# Patient Record
Sex: Female | Born: 1935
Health system: Southern US, Community
[De-identification: ages and names within clinical notes are randomized; demographics above are authoritative.]

## PROBLEM LIST (undated history)

## (undated) DIAGNOSIS — I10 Essential (primary) hypertension: Secondary | ICD-10-CM

## (undated) DIAGNOSIS — J811 Chronic pulmonary edema: Secondary | ICD-10-CM

## (undated) DIAGNOSIS — I5030 Unspecified diastolic (congestive) heart failure: Secondary | ICD-10-CM

## (undated) DIAGNOSIS — I4891 Unspecified atrial fibrillation: Secondary | ICD-10-CM

## (undated) DIAGNOSIS — Z9289 Personal history of other medical treatment: Secondary | ICD-10-CM

## (undated) DIAGNOSIS — Z8781 Personal history of (healed) traumatic fracture: Secondary | ICD-10-CM

## (undated) DIAGNOSIS — I7774 Dissection of vertebral artery: Secondary | ICD-10-CM

## (undated) DIAGNOSIS — Z9889 Other specified postprocedural states: Secondary | ICD-10-CM

## (undated) DIAGNOSIS — M549 Dorsalgia, unspecified: Secondary | ICD-10-CM

## (undated) DIAGNOSIS — G4700 Insomnia, unspecified: Secondary | ICD-10-CM

## (undated) DIAGNOSIS — J189 Pneumonia, unspecified organism: Secondary | ICD-10-CM

## (undated) DIAGNOSIS — M1990 Unspecified osteoarthritis, unspecified site: Secondary | ICD-10-CM

## (undated) DIAGNOSIS — R112 Nausea with vomiting, unspecified: Secondary | ICD-10-CM

## (undated) DIAGNOSIS — G43909 Migraine, unspecified, not intractable, without status migrainosus: Secondary | ICD-10-CM

## (undated) DIAGNOSIS — I509 Heart failure, unspecified: Secondary | ICD-10-CM

## (undated) DIAGNOSIS — R0600 Dyspnea, unspecified: Secondary | ICD-10-CM

## (undated) DIAGNOSIS — K5900 Constipation, unspecified: Secondary | ICD-10-CM

## (undated) DIAGNOSIS — Z8489 Family history of other specified conditions: Secondary | ICD-10-CM

## (undated) DIAGNOSIS — E871 Hypo-osmolality and hyponatremia: Secondary | ICD-10-CM

## (undated) DIAGNOSIS — T8859XA Other complications of anesthesia, initial encounter: Secondary | ICD-10-CM

## (undated) DIAGNOSIS — K5909 Other constipation: Secondary | ICD-10-CM

## (undated) DIAGNOSIS — F419 Anxiety disorder, unspecified: Secondary | ICD-10-CM

## (undated) DIAGNOSIS — G500 Trigeminal neuralgia: Secondary | ICD-10-CM

## (undated) DIAGNOSIS — F329 Major depressive disorder, single episode, unspecified: Secondary | ICD-10-CM

## (undated) DIAGNOSIS — I499 Cardiac arrhythmia, unspecified: Secondary | ICD-10-CM

## (undated) DIAGNOSIS — G8929 Other chronic pain: Secondary | ICD-10-CM

## (undated) DIAGNOSIS — F32A Depression, unspecified: Secondary | ICD-10-CM

## (undated) HISTORY — DX: Unspecified diastolic (congestive) heart failure: I50.30

## (undated) HISTORY — PX: PARTIAL HYSTERECTOMY: SHX80

## (undated) HISTORY — DX: Other chronic pain: G89.29

## (undated) HISTORY — DX: Dorsalgia, unspecified: M54.9

## (undated) HISTORY — DX: Personal history of (healed) traumatic fracture: Z87.81

## (undated) HISTORY — DX: Dissection of vertebral artery: I77.74

## (undated) HISTORY — DX: Unspecified atrial fibrillation: I48.91

## (undated) HISTORY — DX: Essential (primary) hypertension: I10

## (undated) HISTORY — DX: Major depressive disorder, single episode, unspecified: F32.9

## (undated) HISTORY — DX: Hypo-osmolality and hyponatremia: E87.1

## (undated) HISTORY — PX: ANKLE FRACTURE SURGERY: SHX122

## (undated) HISTORY — DX: Anxiety disorder, unspecified: F41.9

## (undated) HISTORY — DX: Migraine, unspecified, not intractable, without status migrainosus: G43.909

## (undated) HISTORY — DX: Insomnia, unspecified: G47.00

## (undated) HISTORY — DX: Depression, unspecified: F32.A

## (undated) HISTORY — PX: CARDIAC CATHETERIZATION: SHX172

## (undated) HISTORY — PX: COLONOSCOPY: SHX174

## (undated) HISTORY — DX: Other constipation: K59.09

---

## 2000-08-31 ENCOUNTER — Encounter: Payer: Self-pay | Admitting: Family Medicine

## 2000-08-31 ENCOUNTER — Ambulatory Visit (HOSPITAL_COMMUNITY): Admission: RE | Admit: 2000-08-31 | Discharge: 2000-08-31 | Payer: Self-pay | Admitting: Family Medicine

## 2001-06-09 ENCOUNTER — Encounter: Payer: Self-pay | Admitting: Family Medicine

## 2001-06-09 ENCOUNTER — Ambulatory Visit (HOSPITAL_COMMUNITY): Admission: RE | Admit: 2001-06-09 | Discharge: 2001-06-09 | Payer: Self-pay | Admitting: Family Medicine

## 2001-08-06 ENCOUNTER — Ambulatory Visit (HOSPITAL_COMMUNITY): Admission: RE | Admit: 2001-08-06 | Discharge: 2001-08-06 | Payer: Self-pay | Admitting: Internal Medicine

## 2002-12-01 ENCOUNTER — Encounter: Payer: Self-pay | Admitting: Family Medicine

## 2002-12-01 ENCOUNTER — Ambulatory Visit (HOSPITAL_COMMUNITY): Admission: RE | Admit: 2002-12-01 | Discharge: 2002-12-01 | Payer: Self-pay | Admitting: Family Medicine

## 2003-04-13 ENCOUNTER — Ambulatory Visit (HOSPITAL_COMMUNITY): Admission: RE | Admit: 2003-04-13 | Discharge: 2003-04-13 | Payer: Self-pay | Admitting: Family Medicine

## 2003-09-30 ENCOUNTER — Ambulatory Visit (HOSPITAL_COMMUNITY): Admission: RE | Admit: 2003-09-30 | Discharge: 2003-09-30 | Payer: Self-pay | Admitting: Family Medicine

## 2003-10-10 ENCOUNTER — Ambulatory Visit (HOSPITAL_COMMUNITY): Admission: RE | Admit: 2003-10-10 | Discharge: 2003-10-10 | Payer: Self-pay | Admitting: Family Medicine

## 2004-05-07 ENCOUNTER — Ambulatory Visit (HOSPITAL_COMMUNITY): Admission: RE | Admit: 2004-05-07 | Discharge: 2004-05-07 | Payer: Self-pay | Admitting: Family Medicine

## 2005-03-10 ENCOUNTER — Ambulatory Visit (HOSPITAL_COMMUNITY): Admission: RE | Admit: 2005-03-10 | Discharge: 2005-03-10 | Payer: Self-pay | Admitting: Family Medicine

## 2005-06-12 ENCOUNTER — Ambulatory Visit (HOSPITAL_COMMUNITY): Admission: RE | Admit: 2005-06-12 | Discharge: 2005-06-12 | Payer: Self-pay | Admitting: Family Medicine

## 2006-07-02 ENCOUNTER — Ambulatory Visit (HOSPITAL_COMMUNITY): Admission: RE | Admit: 2006-07-02 | Discharge: 2006-07-02 | Payer: Self-pay | Admitting: Family Medicine

## 2007-07-15 ENCOUNTER — Ambulatory Visit (HOSPITAL_COMMUNITY): Admission: RE | Admit: 2007-07-15 | Discharge: 2007-07-15 | Payer: Self-pay | Admitting: Family Medicine

## 2008-01-21 ENCOUNTER — Ambulatory Visit: Payer: Self-pay | Admitting: Cardiovascular Disease

## 2008-01-21 ENCOUNTER — Inpatient Hospital Stay (HOSPITAL_COMMUNITY): Admission: EM | Admit: 2008-01-21 | Discharge: 2008-01-22 | Payer: Self-pay | Admitting: Emergency Medicine

## 2008-11-07 ENCOUNTER — Ambulatory Visit: Payer: Self-pay | Admitting: Internal Medicine

## 2008-11-07 DIAGNOSIS — M810 Age-related osteoporosis without current pathological fracture: Secondary | ICD-10-CM | POA: Insufficient documentation

## 2008-11-07 DIAGNOSIS — I10 Essential (primary) hypertension: Secondary | ICD-10-CM | POA: Insufficient documentation

## 2008-11-07 DIAGNOSIS — K5909 Other constipation: Secondary | ICD-10-CM | POA: Insufficient documentation

## 2008-11-07 DIAGNOSIS — K921 Melena: Secondary | ICD-10-CM | POA: Insufficient documentation

## 2008-11-08 ENCOUNTER — Encounter: Payer: Self-pay | Admitting: Gastroenterology

## 2008-11-14 ENCOUNTER — Ambulatory Visit: Payer: Self-pay | Admitting: Gastroenterology

## 2008-11-14 ENCOUNTER — Ambulatory Visit (HOSPITAL_COMMUNITY): Admission: RE | Admit: 2008-11-14 | Discharge: 2008-11-14 | Payer: Self-pay | Admitting: Gastroenterology

## 2009-02-15 ENCOUNTER — Inpatient Hospital Stay (HOSPITAL_COMMUNITY): Admission: EM | Admit: 2009-02-15 | Discharge: 2009-02-19 | Payer: Self-pay | Admitting: Emergency Medicine

## 2009-02-15 ENCOUNTER — Ambulatory Visit: Payer: Self-pay | Admitting: Orthopedic Surgery

## 2009-02-16 ENCOUNTER — Encounter: Payer: Self-pay | Admitting: Orthopedic Surgery

## 2009-02-20 ENCOUNTER — Encounter: Payer: Self-pay | Admitting: Orthopedic Surgery

## 2009-03-01 ENCOUNTER — Ambulatory Visit: Payer: Self-pay | Admitting: Orthopedic Surgery

## 2009-03-01 DIAGNOSIS — S82843A Displaced bimalleolar fracture of unspecified lower leg, initial encounter for closed fracture: Secondary | ICD-10-CM | POA: Insufficient documentation

## 2009-03-12 ENCOUNTER — Telehealth: Payer: Self-pay | Admitting: Orthopedic Surgery

## 2009-03-29 ENCOUNTER — Ambulatory Visit: Payer: Self-pay | Admitting: Orthopedic Surgery

## 2009-04-03 ENCOUNTER — Encounter (HOSPITAL_COMMUNITY): Admission: RE | Admit: 2009-04-03 | Discharge: 2009-05-03 | Payer: Self-pay | Admitting: Orthopedic Surgery

## 2009-04-06 ENCOUNTER — Encounter: Payer: Self-pay | Admitting: Orthopedic Surgery

## 2009-04-11 ENCOUNTER — Encounter: Payer: Self-pay | Admitting: Orthopedic Surgery

## 2009-04-27 ENCOUNTER — Encounter: Payer: Self-pay | Admitting: Orthopedic Surgery

## 2009-05-04 ENCOUNTER — Encounter (HOSPITAL_COMMUNITY): Admission: RE | Admit: 2009-05-04 | Discharge: 2009-06-03 | Payer: Self-pay | Admitting: Orthopedic Surgery

## 2009-05-14 ENCOUNTER — Ambulatory Visit: Payer: Self-pay | Admitting: Orthopedic Surgery

## 2009-05-14 DIAGNOSIS — G5760 Lesion of plantar nerve, unspecified lower limb: Secondary | ICD-10-CM | POA: Insufficient documentation

## 2009-05-25 ENCOUNTER — Encounter: Payer: Self-pay | Admitting: Orthopedic Surgery

## 2009-06-13 ENCOUNTER — Ambulatory Visit: Payer: Self-pay | Admitting: Orthopedic Surgery

## 2010-03-21 NOTE — Assessment & Plan Note (Signed)
Summary: HOSP FOL UP/RT ANKLE POST OP 12/31/10XR CAST CHANGE REMOVE SU...   Referring Provider:  Dr. Lubertha South Primary Provider:  Dr. Lubertha South   History of Present Illness: I saw Katherine Lane in the office today for a followup visit.  She is a 77 years old woman with the complaint of:  POST OP RIGHT ANKLE.  DOS   02-16-09.  Procedure Open treatment and internal fixation of right ankle.  Medication   not taking any medication right now.  Patient is extremely claustrophobic.    Subjectives  Xray, cast change, and remove sutures.  Examination reveals that she has some anterior blisters which are small.  Her wounds look fine.  Has minimal swelling in the foot.  X-rays 3 views of the RIGHT ankle show a lateral plate medial screw with a pin.  The fractures were well reduced the mortise is intact  Impression stable fixation RIGHT ankle  Doing well I put her in a Aircast because of claustrophobia we did some wound care with a Xeroform and bandage.  Continue nonweightbearing   Allergies: 1)  ! Codeine   Impression & Recommendations:  Problem # 1:  AFTERCARE FOLLOW SURGERY MUSCULOSKEL SYSTEM NEC (ICD-V58.78) Assessment Comment Only  Orders: Post-Op Check (16109) Short Leg Cast (60454)  Problem # 2:  CLOSED BIMALLEOLAR FRACTURE (ICD-824.4) Assessment: Comment Only  Orders: Post-Op Check (09811) Short Leg Cast (91478)  Patient Instructions: 1)  no weight bearing  2)  return in 4 weeks for xrays right ankle

## 2010-03-21 NOTE — Miscellaneous (Signed)
Summary: Addendum to Plan of treatment  Addendum to Plan of treatment   Imported By: Jacklynn Ganong 03/14/2009 13:14:15  _____________________________________________________________________  External Attachment:    Type:   Image     Comment:   External Document

## 2010-03-21 NOTE — Letter (Signed)
Summary: TCS ORDER  TCS ORDER   Imported By: Elinor Parkinson 11/08/2008 12:33:00  _____________________________________________________________________  External Attachment:    Type:   Image     Comment:   External Document

## 2010-03-21 NOTE — Miscellaneous (Signed)
Summary: PT Progress note  PT Progress note   Imported By: Jacklynn Ganong 05/15/2009 13:09:53  _____________________________________________________________________  External Attachment:    Type:   Image     Comment:   External Document

## 2010-03-21 NOTE — Assessment & Plan Note (Signed)
Summary: 1 M RE-CK RT ANKLE/HAD SURG 02/16/09/MEDICARE,BC/CAF   Visit Type:  Follow-up Referring Provider:  Dr. Lubertha South Primary Provider:  Dr. Lubertha South  CC:  one month recheck rt ankle post op.  History of Present Illness: She is a 77 year old woman on February 17, 1999 and the patient had open treatment internal fixation of the RIGHT ankle with bimalleolar fracture after fracture dislocation.  Her main problems at this point include swelling and increased redness over the ankle joint after the end of the day.  This always goes down by the morning.  Otherwise she is weightbearing as tolerated, she is in regular shoes with no brace.  She can do almost everything she did before her injury.  She does get a twinge of pain here and there.  She is on no medications for the ankle.  When I saw her last time she got an injection and that seems to have done well  DOS 02/16/09 right ankle.    Allergies: 1)  ! Codeine  Physical Exam  Additional Exam:  the ankle looks very good at this point.  There is no swelling at this time and the incisions have healed nicely.  She is 10 of dorsiflexion 25 of plantar flexion ankle is stable.  Pulse and perfusion are normal in the sensation on the dorsum of the foot is normal   Impression & Recommendations:  Problem # 1:  LESION OF PLANTAR NERVE (ICD-355.6) Assessment Improved  Orders: Est. Patient Level II (40981)  Problem # 2:  CLOSED BIMALLEOLAR FRACTURE (ICD-824.4) Assessment: Improved  Orders: Est. Patient Level II (19147)  Patient Instructions: 1)  Please schedule a follow-up appointment as needed.

## 2010-03-21 NOTE — Assessment & Plan Note (Signed)
Summary: 4 WK RE-CK/XRAY RT ANKLE/POST OP 02/16/09/MEDICARE/CAF   Visit Type:  Follow-up Referring Provider:  Dr. Lubertha South Primary Provider:  Dr. Lubertha South  CC:  POST OP ANKLE.  History of Present Illness: She is a 77 year old woman  POD # 41  DOS 02/16/09 right ankle.  Procedure  Open treatment and internal fixation of right ankle.  Medication Asprin as needed.  Subjectives recheck ankle with xrays.  Patient is nonweightbearing in aircast.  C/O  throbbing at night.  The patient's ankle is quite stiff, but I can bring her foot to neutral. She has about 20 of plantar flexion. Her wounds look good. Her skin is improving.  Her x-rays show 3 views of the RIGHT ankle. There is a long plate with internal fixation of the medial screw and pin with an intact mortise. The fracture is healing with no visible fracture line.  Impression healing fracture   return in 6 weeks for repeat xrays          Allergies: 1)  ! Codeine   Impression & Recommendations:  Problem # 1:  AFTERCARE FOLLOW SURGERY MUSCULOSKEL SYSTEM NEC (ICD-V58.78) Assessment Improved  Orders: Physical Therapy Referral (PT) Post-Op Check (16109) Ankle x-ray complete,  minimum 3 views (60454)  Problem # 2:  CLOSED BIMALLEOLAR FRACTURE (ICD-824.4) Assessment: Improved  Orders: Post-Op Check (09811) Ankle x-ray complete,  minimum 3 views (91478)  Patient Instructions: 1)  Physical Therapy  2)  Air cast  3)  FWB  4)  retrun in 6 weeks xrays right ankle

## 2010-03-21 NOTE — Assessment & Plan Note (Signed)
Summary: TROUBLE WITH HER BOWELS,CONSULT FOR COLONOSCOPY/AMS   Visit Type:  Initial Consult Referring Provider:  Dr. Lubertha South Primary Care Provider:  Dr. Lubertha South  Chief Complaint:  severe constipation and hemorroids.  History of Present Illness: 77 y/o caucasian female with constipation/obstipation for over 20 years, worse in past year.  Has to use digital dis-impaction.  Using miralax 17 grams daily worked well, then stopped working.  c/o large bleeding feels like hemorrhoids.  Mag citrate seems to help.  c/o rectal pain constant.  c/o round hard balls.  Tried fiber but quit working.  Using OTC glycerin suppositories couple times per month.  Using fleets enemas once q2-3 months.  Prior ineffective treatment includes laxatives, high fiber, stool softeners, and suppositories.  The patient complains of nausea, rectal pain, rectal bleeding, incomplete evacuation, previous history of constipation, and # 2-3 BM /week, but denies diarrhea, abdominal pain, vomiting, flatus, and constipation in childhood.     Current Problems (verified): 1)  Hematochezia  (ICD-578.1) 2)  Hypertension  (ICD-401.9) 3)  Osteoporosis  (ICD-733.00) 4)  Constipation, Chronic  (ICD-564.09)  Current Medications (verified): 1)  Calan Sr 240 Mg Cr-Tabs (Verapamil Hcl) .... Once Daily 2)  Vasotec 20 Mg Tabs (Enalapril Maleate) .... Take 1/2 Once Daily 3)  Toprol Xl 25 Mg Xr24h-Tab (Metoprolol Succinate) .... Once Daily 4)  Ambien 10 Mg Tabs (Zolpidem Tartrate) .... At Bedtime 5)  Calcium With Vit D .... Once Daily 6)  Citrate of Magnesia  Soln (Magnesium Citrate) .... As Needed 7)  Amitiza 24 Mcg Caps (Lubiprostone) .... One By Mouth Daily For 2 Days As Needed For Constipation, May Increase To Twice Per Day As Needed Constipation  Allergies (verified): 1)  ! Codeine  Past History:  Past Medical History: HYPERTENSION (ICD-401.9) OSTEOPOROSIS (ICD-733.00) CONSTIPATION, CHRONIC (ICD-564.09)  Colonoscopy by Dr Karilyn Cota 08/06/01-> medium sigmoid diverticula, hemorrhoids, diffuse melanosis   Past Surgical History: partial hysterectomy (77 yrs old)  Family History: No known family history of colorectal carcinoma, IBD, liver or chronic GI problems. Father: (deceased 60) hip fx Mother: (deceased 68) Alzheimer's, CA uterine Siblings: 4 alive DM 1 daughter DM  Social History: married 1955 3 healthy grown children Occupation: Futures trader, retired' Patient has never smoked.  Alcohol Use - no Daily Caffeine Use Illicit Drug Use - no Patient gets regular exercise. Smoking Status:  never Drug Use:  no Does Patient Exercise:  yes  Review of Systems General:  Complains of sleep disorder; denies fever, chills, sweats, anorexia, fatigue, weakness, malaise, and weight loss. CV:  Denies chest pains, angina, palpitations, syncope, dyspnea on exertion, orthopnea, PND, peripheral edema, and claudication. Resp:  Denies dyspnea at rest, dyspnea with exercise, cough, sputum, wheezing, coughing up blood, and pleurisy. GI:  Complains of indigestion/heartburn; denies difficulty swallowing, pain on swallowing, vomiting, vomiting blood, jaundice, gas/bloating, and fecal incontinence; occ monthly indigestion, takes TUMS helps. GU:  Denies urinary burning, blood in urine, nocturnal urination, urinary frequency, urinary incontinence, and abnormal vaginal bleeding. Derm:  Denies rash, itching, dry skin, hives, moles, warts, and unhealing ulcers. Psych:  Denies depression, anxiety, memory loss, suicidal ideation, hallucinations, paranoia, phobia, and confusion. Heme:  Complains of bleeding; denies bruising and enlarged lymph nodes; hemorrhoids.  Vital Signs:  Patient profile:   78 year old female Height:      62.5 inches Weight:      142 pounds BMI:     25.65 Temp:     97.8 degrees F oral Pulse rate:   72 / minute  BP sitting:   124 / 80  (left arm) Cuff size:   regular   Vitals Entered By: Hendricks Limes LPN (November 07, 2008 10:29 AM)  Physical Exam  General:  Well developed, well nourished, no acute distress. Head:  Normocephalic and atraumatic. Eyes:  Sclera clear, no icterus. Ears:  Normal auditory acuity. Nose:  No deformity, discharge,  or lesions. Mouth:  No deformity or lesions, dentition normal. Neck:  Supple; no masses or thyromegaly. Lungs:  Clear throughout to auscultation. Heart:  Regular rate and rhythm; no murmurs, rubs,  or bruits. Abdomen:  Soft, nontender and nondistended. No masses, hepatosplenomegaly or hernias noted. Normal bowel sounds.without guarding and without rebound.   Rectal:  small amt soft med brown stool in vault.  hemocult negative and external hemorrhoids and skin tags.  No internal masses palpated.  Good sphincter tone. Msk:  Symmetrical with no gross deformities. Normal posture. Pulses:  Normal pulses noted. Extremities:  No clubbing, cyanosis, edema or deformities noted. Neurologic:  Alert and  oriented x4;  grossly normal neurologically. Skin:  Intact without significant lesions or rashes. Cervical Nodes:  No significant cervical adenopathy. Inguinal Nodes:  No significant inguinal adenopathy. Psych:  Alert and cooperative. Normal mood and affect.  Impression & Recommendations:  Problem # 1:  HEMATOCHEZIA (ICD-578.1) 77 y/o caucasian female with chronic constipation/obstipation, worsening over past year.  She has had intermittant hematochezia which could be due to benign ano-rectal source such as hemorrhoids, diverticula, but we do need to rule out colorectal carcinoma given the fact that last colonoscopy was over 7 yrs ago.   Diagnostic colonoscopy to be performed by Dr. Kassie Mends in the near future.  I have discussed risks and benefits which include, but are not limited to, bleeding, infection, perforation, or medication reaction.  The patient agrees with this plan and consent will be obtained.  Orders:  Diagnostic Colonoscopy (Colonscpy - Diag) Consultation Level III [phone])  Problem # 2:  CONSTIPATION, CHRONIC (ICD-564.09) Will start amitiza.  It is possible that she may have chronic pelvic floor dysfunction.  Patient Instructions: 1)  Constipation and Hemorrhoids brochure given.  2)  Begin Amitiza daily or twice per day as needed for constipation,  Hold if you develop diarrhea 3)  Fiberchoice or Benefiber every day 4)  Can use plain stool softeners or miralax as needed as well 5)  Goal:  BM every other day 6)  The medication list was reviewed and reconciled.  All changed / newly prescribed medications were explained.  A complete medication list was provided to the patient / caregiver. Prescriptions: AMITIZA 24 MCG CAPS (LUBIPROSTONE) one by mouth daily for 2 days as needed for constipation, may increase to twice per day as needed constipation  #60 x 1   Entered and Authorized by:   Joselyn Arrow FNP-BC   Signed by:   Joselyn Arrow FNP-BC on 11/07/2008   Method used:   Electronically to        [facility] (retail)       [address]       [place], [place]  [address]       Ph: [phone]       Fax: [phone]   RxID:   [id number]   Appended Document: TROUBLE WITH HER BOWELS,CONSULT FOR COLONOSCOPY/AMS Agree with above. Needs high fiber diet HO.  Appended Document: TROUBLE WITH HER BOWELS,CONSULT FOR COLONOSCOPY/AMS Called, line busy. Diet placed in the mail.  Appended Document: TROUBLE WITH HER BOWELS,CONSULT FOR COLONOSCOPY/AMS Pt aware diet in the mail.  Appended Document: TROUBLE WITH HER BOWELS,CONSULT FOR COLONOSCOPY/AMS WEIGHT: JAN 2010 138.6

## 2010-03-21 NOTE — Progress Notes (Signed)
Summary: wanats order for wheelchair  Phone Note Other Incoming   Summary of Call: Katherine Lane with [facility] called and requested an order be faxed to [facility] for a wheelchair with elevated footrest  for Katherine Lane ([birth year]) [facility] # [phone]   Initial call taken by: Katherine Lane,  March 12, 2009 12:10 PM  Follow-up for Phone Call        ok to call in request  Follow-up by: Katherine Canada MD,  March 12, 2009 12:44 PM  Additional Follow-up for Phone Call Additional follow up Details #1::        Gave this request to [facility] at [facility] they will need a written order faxed tomorrow 03/13/09 Additional Follow-up by: Katherine Lane,  March 12, 2009 5:15 PM    New/Updated Medications: WHEELCHAIR  MISC (MISC. DEVICES) one wheel chair with right elevated foot rest  rt ankle fracture post op Prescriptions: WHEELCHAIR  MISC (MISC. DEVICES) one wheel chair with right elevated foot rest  rt ankle fracture post op  #1 x 0   Entered by:   Katherine Lane   Authorized by:   Katherine Canada MD   Signed by:   Katherine Lane on 03/13/2009   Method used:   Faxed to ...       [facility] (retail)       [address]       [place], [place]  [address]       Ph: [phone]       Fax: [phone]   RxID:   [id number]

## 2010-03-21 NOTE — Miscellaneous (Signed)
Summary: physician's orders for equipment  physician's orders for equipment   Imported By: Jacklynn Ganong 04/11/2009 09:53:30  _____________________________________________________________________  External Attachment:    Type:   Image     Comment:   External Document

## 2010-03-21 NOTE — Miscellaneous (Signed)
Summary: PT progress note  PT progress note   Imported By: Jacklynn Ganong 06/04/2009 12:22:57  _____________________________________________________________________  External Attachment:    Type:   Image     Comment:   External Document

## 2010-03-21 NOTE — Miscellaneous (Signed)
Summary: Rehab Report  Rehab Report   Imported By: Elvera Maria 04/10/2009 11:12:24  _____________________________________________________________________  External Attachment:    Type:   Image     Comment:   clinical eval pt

## 2010-03-21 NOTE — Assessment & Plan Note (Signed)
Summary: 6 WK RE-CK/XRAYS RT ANKLE/POST OP 02/16/09/MEDICARE,BCBS/CAF   Visit Type:  Follow-up Referring Provider:  Dr. Lubertha South Primary Provider:  Dr. Lubertha South  CC:  post op ankle.  History of Present Illness: She is a 77 year old woman  DOS 02/16/09 right ankle.  3 months status post:   Procedure  Open treatment and internal fixation of right ankle.  Medication Asprin as needed.   Patient is weightbearing in aircast.   Subjectives recheck ankle with xrays after PT. she also complains of some pain plantar aspect of the foot between the fourth and third digit with some associated tingling  Positive squeeze test for neuroma  Ankle range of motion is indicated in therapy report from March 18 which I have reviewed that  He is to be doing well has little swelling a little  dependent rubor in the foot  X-rays 3 views RIGHT ankle Lateral plate and screw construct with medial pin and screw mortise is intact fracture is healed  Impression healed RIGHT ankle fracture with internal fixation        Allergies: 1)  ! Codeine  Review of Systems Neurologic:  Complains of numbness; third and fourth toe pain with numbness.   Impression & Recommendations:  Problem # 1:  AFTERCARE FOLLOW SURGERY MUSCULOSKEL SYSTEM NEC (ICD-V58.78) Assessment Improved  Orders: Post-Op Check (16109) Ankle x-ray complete,  minimum 3 views (60454)  Problem # 2:  CLOSED BIMALLEOLAR FRACTURE (ICD-824.4) Assessment: Improved  Orders: Post-Op Check (09811) Ankle x-ray complete,  minimum 3 views (91478)  Problem # 3:  LESION OF PLANTAR NERVE (ICD-355.6) Assessment: New  injected third and fourth interspace After sterile prep and the third fourth interspace of the RIGHT foot was injected for neuroma  No complications  She will continue her physical therapy and return in a month no x-ray  Orders: Est. Patient Level III (29562) Joint Aspirate / Injection, Small (13086) Depo-  Medrol 40mg  (J1030)  Patient Instructions: 1)  You have received an injection of cortisone today. You may experience increased pain at the injection site. Apply ice pack to the area for 20 minutes every 2 hours and take 2 xtra strength tylenol every 8 hours. This increased pain will usually resolve in 24 hours. The injection will take effect in 3-10 days.  2)  Continue Physical Therapy  3)  Return in 1 month

## 2010-04-01 ENCOUNTER — Emergency Department (HOSPITAL_COMMUNITY)
Admission: EM | Admit: 2010-04-01 | Discharge: 2010-04-01 | Disposition: A | Discharge status: Home / Self Care | Payer: Medicare Other | Attending: Emergency Medicine | Admitting: Emergency Medicine

## 2010-04-01 DIAGNOSIS — R112 Nausea with vomiting, unspecified: Secondary | ICD-10-CM | POA: Insufficient documentation

## 2010-04-01 DIAGNOSIS — I10 Essential (primary) hypertension: Secondary | ICD-10-CM | POA: Insufficient documentation

## 2010-04-01 DIAGNOSIS — R197 Diarrhea, unspecified: Secondary | ICD-10-CM | POA: Insufficient documentation

## 2010-04-01 DIAGNOSIS — Z79899 Other long term (current) drug therapy: Secondary | ICD-10-CM | POA: Insufficient documentation

## 2010-04-01 LAB — DIFFERENTIAL
Basophils Absolute: 0 10*3/uL (ref 0.0–0.1)
Basophils Relative: 0 % (ref 0–1)
Eosinophils Absolute: 0 10*3/uL (ref 0.0–0.7)
Eosinophils Relative: 0 % (ref 0–5)
Lymphocytes Relative: 9 % — ABNORMAL LOW (ref 12–46)
Lymphs Abs: 0.7 10*3/uL (ref 0.7–4.0)
Monocytes Absolute: 0.4 10*3/uL (ref 0.1–1.0)
Monocytes Relative: 5 % (ref 3–12)
Neutro Abs: 6.3 10*3/uL (ref 1.7–7.7)
Neutrophils Relative %: 85 % — ABNORMAL HIGH (ref 43–77)

## 2010-04-01 LAB — BASIC METABOLIC PANEL
BUN: 18 mg/dL (ref 6–23)
CO2: 24 mEq/L (ref 19–32)
Calcium: 9 mg/dL (ref 8.4–10.5)
Chloride: 106 mEq/L (ref 96–112)
Creatinine, Ser: 1.05 mg/dL (ref 0.4–1.2)
GFR calc Af Amer: >60 mL/min (ref >60)
GFR calc non Af Amer: 51 mL/min — ABNORMAL LOW (ref >60)
Glucose, Bld: 107 mg/dL — ABNORMAL HIGH (ref 70–99)
Potassium: 3.7 mEq/L (ref 3.5–5.1)
Sodium: 142 mEq/L (ref 135–145)

## 2010-04-01 LAB — CBC
HCT: 42 % (ref 36.0–46.0)
Hemoglobin: 14.1 g/dL (ref 12.0–15.0)
MCH: 29.9 pg (ref 26.0–34.0)
MCHC: 33.6 g/dL (ref 30.0–36.0)
MCV: 89.2 fL (ref 78.0–100.0)
Platelets: 261 10*3/uL (ref 150–400)
RBC: 4.71 MIL/uL (ref 3.87–5.11)
RDW: 13.8 % (ref 11.5–15.5)
WBC: 7.4 10*3/uL (ref 4.0–10.5)

## 2010-04-01 LAB — HEPATIC FUNCTION PANEL
ALT: 17 U/L (ref 0–35)
AST: 26 U/L (ref 0–37)
Albumin: 3.9 g/dL (ref 3.5–5.2)
Alkaline Phosphatase: 74 U/L (ref 39–117)
Bilirubin, Direct: 0.2 mg/dL (ref 0.0–0.3)
Indirect Bilirubin: 0.5 mg/dL (ref 0.3–0.9)
Total Bilirubin: 0.7 mg/dL (ref 0.3–1.2)
Total Protein: 6.7 g/dL (ref 6.0–8.3)

## 2010-04-01 LAB — LIPASE, BLOOD: Lipase: 17 U/L (ref 11–59)

## 2010-05-01 ENCOUNTER — Other Ambulatory Visit (HOSPITAL_BASED_OUTPATIENT_CLINIC_OR_DEPARTMENT_OTHER): Payer: Self-pay | Admitting: Family Medicine

## 2010-05-01 DIAGNOSIS — Z139 Encounter for screening, unspecified: Secondary | ICD-10-CM

## 2010-05-09 ENCOUNTER — Ambulatory Visit (HOSPITAL_COMMUNITY)
Admission: RE | Admit: 2010-05-09 | Discharge: 2010-05-09 | Disposition: A | Discharge status: Home / Self Care | Payer: Medicare Other | Source: Ambulatory Visit | Attending: Family Medicine | Admitting: Family Medicine

## 2010-05-09 DIAGNOSIS — Z139 Encounter for screening, unspecified: Secondary | ICD-10-CM

## 2010-05-09 DIAGNOSIS — Z1231 Encounter for screening mammogram for malignant neoplasm of breast: Secondary | ICD-10-CM | POA: Insufficient documentation

## 2010-05-20 LAB — HEPATIC FUNCTION PANEL
ALT: 15 U/L (ref 0–35)
AST: 21 U/L (ref 0–37)
Albumin: 3.9 g/dL (ref 3.5–5.2)
Alkaline Phosphatase: 71 U/L (ref 39–117)
Bilirubin, Direct: 0.1 mg/dL (ref 0.0–0.3)
Indirect Bilirubin: 0.3 mg/dL (ref 0.3–0.9)
Total Bilirubin: 0.4 mg/dL (ref 0.3–1.2)
Total Protein: 6.3 g/dL (ref 6.0–8.3)

## 2010-05-20 LAB — URINALYSIS, ROUTINE W REFLEX MICROSCOPIC
Bilirubin Urine: NEGATIVE
Glucose, UA: NEGATIVE mg/dL
Hgb urine dipstick: NEGATIVE
Ketones, ur: NEGATIVE mg/dL
Nitrite: NEGATIVE
Protein, ur: NEGATIVE mg/dL
Specific Gravity, Urine: <1.005 — ABNORMAL LOW (ref 1.005–1.030)
Urobilinogen, UA: 0.2 mg/dL (ref 0.0–1.0)
pH: 6.5 (ref 5.0–8.0)

## 2010-05-20 LAB — BASIC METABOLIC PANEL
BUN: 12 mg/dL (ref 6–23)
CO2: 29 mEq/L (ref 19–32)
Calcium: 9.1 mg/dL (ref 8.4–10.5)
Chloride: 106 mEq/L (ref 96–112)
Creatinine, Ser: 0.91 mg/dL (ref 0.4–1.2)
GFR calc Af Amer: >60 mL/min (ref >60)
GFR calc non Af Amer: >60 mL/min (ref >60)
Glucose, Bld: 108 mg/dL — ABNORMAL HIGH (ref 70–99)
Potassium: 3.6 mEq/L (ref 3.5–5.1)
Sodium: 141 mEq/L (ref 135–145)

## 2010-05-20 LAB — DIFFERENTIAL
Basophils Absolute: 0 10*3/uL (ref 0.0–0.1)
Basophils Relative: 0 % (ref 0–1)
Eosinophils Absolute: 0 10*3/uL (ref 0.0–0.7)
Eosinophils Relative: 0 % (ref 0–5)
Lymphocytes Relative: 15 % (ref 12–46)
Lymphs Abs: 1.1 10*3/uL (ref 0.7–4.0)
Monocytes Absolute: 0.3 10*3/uL (ref 0.1–1.0)
Monocytes Relative: 4 % (ref 3–12)
Neutro Abs: 5.7 10*3/uL (ref 1.7–7.7)
Neutrophils Relative %: 80 % — ABNORMAL HIGH (ref 43–77)

## 2010-05-20 LAB — CBC
HCT: 38.9 % (ref 36.0–46.0)
Hemoglobin: 13.2 g/dL (ref 12.0–15.0)
MCHC: 33.9 g/dL (ref 30.0–36.0)
MCV: 90.1 fL (ref 78.0–100.0)
Platelets: 231 10*3/uL (ref 150–400)
RBC: 4.32 MIL/uL (ref 3.87–5.11)
RDW: 13.8 % (ref 11.5–15.5)
WBC: 7.1 10*3/uL (ref 4.0–10.5)

## 2010-07-02 ENCOUNTER — Encounter: Payer: Self-pay | Admitting: Orthopedic Surgery

## 2010-07-02 ENCOUNTER — Ambulatory Visit (INDEPENDENT_AMBULATORY_CARE_PROVIDER_SITE_OTHER): Payer: Medicare Other | Admitting: Orthopedic Surgery

## 2010-07-02 DIAGNOSIS — S82899A Other fracture of unspecified lower leg, initial encounter for closed fracture: Secondary | ICD-10-CM

## 2010-07-02 NOTE — Discharge Summary (Signed)
NAMEKARINNA, BEADLES NO.:  [id number]   MEDICAL RECORD NO.:  [id number]          PATIENT TYPE:  INP   LOCATION:  4739                         FACILITY:  [facility]   PHYSICIAN:  Pricilla Riffle, MD, FACCDATE OF BIRTH:  07/07/1932   DATE OF ADMISSION:  01/21/2008  DATE OF DISCHARGE:  01/22/2008                               DISCHARGE SUMMARY   PRIMARY CARE PHYSICIAN:  Scott A. Gerda Diss, MD   DISCHARGING DIAGNOSIS:  Chest pain, negative cardiac workup this  admission status post cardiac catheterization showed normal coronary  angiography and left ventricular wall motion, normal pulmonary artery  pressure and pulmonary artery wedge pressure.  Also status post CT  angiogram of the chest showed no evidence of acute pulmonary emboli or  other acute chest process.  The patient to follow up with Dr. Gerda Diss,  her primary care physician for further evaluation.   PAST MEDICAL HISTORY:  1. Hypertension.  2. Psoriasis, on steroids.  3. Osteoporosis.  4. Chronic constipation.  5. Chronic back pain.  6. Migraine headaches.   HOSPITAL COURSE:  Ms. Fidalgo is a [name] with no  known prior history of coronary artery disease who complained of  exertional dyspnea and fatigue over 1 month.  Over the last few weeks,  noted aching, throbbing pain, substernal with exertion, relieved with  rest.  Prior to admission, the patient continued to have the same  symptoms, was admitted for further evaluation after being seen by Dr.  Earney Hamburg who felt the patient would benefit from cardiac  catheterization for definitive evaluation of coronary anatomy and  possible treatment.  The patient was willing to proceed.  She underwent  cardiac catheterization on January 21, 2008, by Dr. Charlies Constable.  The  patient with normal coronary anatomy, underwent CT angiogram, and she  had a positive D-dimer.  CT angiogram results as stated above.  The  patient seen by Dr. Dietrich Pates on  day of discharge.  The patient mildly  hypertensive still.  Beta-blocker therapy initiated in addition to  verapamil 240 mg daily, Vasotec 10 mg daily, aspirin 81 mg daily.  Toprol-XL 25 mg daily also initiated.  The patient instructed to take  aspirin 81 mg daily.  Okay  to resume her previous calcium and vitamin D.  Ambien and lorazepam as  previously prescribed.  The patient to follow up with Dr. Gerda Diss for  further evaluation and post-cath check in 1-2 weeks.   DURATION OF DISCHARGE ENCOUNTER:  Less than 30 minutes.      Dorian Pod, ACNP      Pricilla Riffle, MD, [facility]  Electronically Signed    MB/MEDQ  D:  01/22/2008  T:  01/22/2008  Job:  [id number]   cc:   Lorin Picket A. Gerda Diss, MD

## 2010-07-02 NOTE — Discharge Summary (Signed)
NAME:  Katherine Lane, Katherine Lane NO.:  [id number]   MEDICAL RECORD NO.:  [id number]          PATIENT TYPE:  EMS   LOCATION:  MAJO                         FACILITY:  [facility]   PHYSICIAN:  Verne Carrow, MDDATE OF BIRTH:  Jun 25, 1932   DATE OF ADMISSION:  01/21/2008  DATE OF DISCHARGE:                               DISCHARGE SUMMARY   PRIMARY CARDIOLOGIST:  Verne Carrow, MD   PRIMARY CARE PHYSICIAN:  Donna Bernard, MD   HISTORY OF PRESENT ILLNESS:  This is a [name]  without prior history of CAD with complaints of exertional dyspnea and  fatigue over the last month.  Over the last few weeks, she had a achy  throbbing pain substernal with exertion, relieved with rest.  Yesterday  afternoon while decorating for Christmas, she had substernal chest pain  lasting 2 hours, relieved with rest, but when she began to exert herself  again, the pain returned.  She took an aspirin and an Ambien last night  and went to sleep.  She did call her primary care physician after the  pain had been occurring to make an appointment.  The pain was associated  with shortness of breath, and her daughter stated that when she was  speaking with her on the phone she was short of breath, but has noticed  that she has become more short of breath over the last month.  The  patient was seen by Dr. Gerda Diss in the office who did an EKG and sent her  for evaluation in the emergency room.  On arrival, the patient has been  pain-free and has not had any chest discomfort today.  She does admit to  weakness associated with discomfort yesterday, and the pain was  nonradiating.   REVIEW OF SYSTEMS:  Positive for chest pain, shortness of breath,  dyspnea on exertion, and generalized weakness and fatigue, which has  been progressive.   PAST MEDICAL HISTORY:  1. Hypertension.  The patient states that she has had recently hard-to-      control hypertension, although she has had  it for 15 years.  She      has been noticing that her blood pressures have been continuing to      rise greater than 200 systolic.  2. Psoriasis, on steroids.  3. Osteoporosis.  4. Chronic constipation.  5. Chronic back pain.  6. Migraine headaches.   PAST SURGICAL HISTORY:  Hysterectomy.   SOCIAL HISTORY:  She lives in [place] with her husband, and she is  retired.  She has never smoked.  She does not drink alcohol.  She walks  daily and was very active at home until recently.   FAMILY HISTORY:  Mother deceased with Alzheimer's.  Father deceased  after chronic medical issues who fell, was hospitalized, and never  recovered.  She has siblings with no cardiac issues or prior history of  cardiac disease.   CURRENT MEDICATIONS:  1. Calan SR 240 mg daily.  2. Vasotec 10 mg at bedtime.  3. Calcium daily.  4. Vitamin D daily.  5. Ambien 10 mg at  bedtime.  6. Lorazepam 0.5 mg p.r.n.  7. Hydrochlorothiazide p.r.n.   ALLERGIES:  To CODEINE causing severe nausea and vomiting.   CURRENT LABORATORY DATA:  Pending.   PHYSICAL EXAMINATION:  VITAL SIGNS:  Blood pressure 159/84, pulse 77,  respirations 18, temperature 97.4, and O2 sat 94% on room air.  GENERAL:  She is awake, alert, and oriented.  No acute distress at  present.  HEENT:  Head is normocephalic and atraumatic.  Eyes:  PERRLA.  Mucous  membranes of mouth pink and moist.  Tongue is midline.  NECK:  Supple.  There is no JVD.  No carotid bruits appreciated.  CARDIOVASCULAR:  Regular rate and rhythm without murmurs, rubs, or  gallops.  Pulses 2+ and equal without bruits noted.  LUNGS:  Clear to auscultation without wheezes, rales, or rhonchi.  ABDOMEN:  Soft, nontender with 2+ bowel sounds.  EXTREMITIES:  Without clubbing, cyanosis, or edema.  NEUROLOGIC:  Cranial nerves II through XII are grossly intact.   Chest x-ray is pending.  EKG completed at [facility] revealed sinus  rhythm with sinus arrhythmia with evidence of  lateral infarct.   IMPRESSION:  1. Chest pain, symptoms worrisome for angina.  2. Hypertension.  3. History of chronic back pain.   This is a [name] without prior history of coronary  artery disease, but has history of hypertension, more difficult to  control over the last month with complaints of throbbing achy chest pain  with exertion associated with weakness and shortness of breath along  with dyspnea on exertion.  The pain was relieved with rest.  We will  admit, rule out myocardial infarction,  cycle cardiac enzymes, and start  on low-dose beta-blocker and aspirin.   Dr. Clifton James has also assessed the patient and feels that it would be  best to plan cardiac catheterization today for definitive evaluation of  coronary anatomy.  The patient has been explained risks, benefits, and  also a description of  the procedure.  She is willing to proceed.  The patient has been  scheduled for cardiac catheterization within the hour.  We will make  further recommendations based upon hospital course, results of  catheterization, and the patient's response to treatment.      Katherine Lane. Katherine Bishop, NP      Verne Carrow, MD  Electronically Signed    KML/MEDQ  D:  01/21/2008  T:  01/22/2008  Job:  [id number]   cc:   Donna Bernard, M.D.

## 2010-07-02 NOTE — Progress Notes (Signed)
A 77 year old female, status post open treatment internal fixation of bimalleolar ankle fracture in 2010 , Has done very well, presents back with 2 complaints one loss of the hardware is becoming dislodged on the lateral side of the ankle as the screws are prominent into there is a cyst or swelling. That formed in the anterolateral ankle joint, after she's ambulated during the day, seems to go down during the night.  Review of systems otherwise, normal.  Exam shows a well-developed, well-nourished, female, swimming arranging intact. Normal small frame.  No tenderness or pain around the hardware. There is a small cyst at the anterolateral ankle joint ankle joint is otherwise stable. Screws are palpable beneath the skin, no problems with the skin. No cellulitis is noted. No redness is noted. Ankle joint is normal. In terms of swelling.  X-ray will be taken.  X-rays show no problems with the hardware. The fracture, healed, and the ankle mortise is perfectly reduced.  Patient is advised to come back on an as-needed basis. If the cyst persists even after the evening and next day, then I would advise to have it removed or we could even aspirated.

## 2010-07-02 NOTE — Progress Notes (Signed)
Separate x-ray report.  RIGHT ankle.  Reason for x-ray. Patient felt screws subcutaneously.  Previous bimalleolar ankle fracture open treatment internal fixation.  No problems are seen on the x-ray. The hardware is intact. The fracture healed. The mortise is reduced.  Impression normal ankle x-ray with internal fixation

## 2010-07-02 NOTE — Cardiovascular Report (Signed)
NAMEELIF, YONTS                ACCOUNT NO.:  [id number]   MEDICAL RECORD NO.:  [id number]          PATIENT TYPE:  INP   LOCATION:  4739                         FACILITY:  [facility]   PHYSICIAN:  Everardo Beals. Juanda Chance, MD, FACCDATE OF BIRTH:  1932-07-11   DATE OF PROCEDURE:  01/21/2008  DATE OF DISCHARGE:                            CARDIAC CATHETERIZATION   CLINICAL HISTORY:  Ms. Plourde is 77 years old and has no prior history  of known heart disease.  She does have a history of hypertension.  She  has had symptoms of dyspnea on exertion over the past 2 months and over  the past 2 weeks she has had chest pressure with exertion.  She was seen  by Dr. Lilyan Punt and was sent to our emergency room for admission and  further evaluation.  She was seen by Dr. Clifton James and arrangements were  made for her to be evaluated with angiography.   PROCEDURE:  A left heart catheterization was performed percutaneously  through the left femoral artery using arterial sheath and a 5-French  preformed coronary catheter.  A front wall arterial puncture was  performed and Omnipaque contrast was used.  After we identified that  there was no major coronary obstruction, we decided to do a right heart  catheterization.  This was performed via the right femoral vein using  the Anesthesia placed Swan-Ganz thermodilution catheter.  The patient  tolerated the procedure well and left the laboratory in satisfactory  condition.   RESULTS:  The left main coronary artery:  The left main coronary artery  was free of significant disease.   The left anterior descending artery:  The left anterior descending  artery gave rise to a diagonal branch, 2 septal perforators, and a  second diagonal branch.  The LAD had mild irregularities, but no major  obstruction.   The circumflex artery:  The circumflex artery gave rise to 2 small  marginal branches and atrial branch and then a large posterolateral  branch.  These vessels were  free of significant disease.   The right coronary artery:  The right coronary artery is a moderate-  sized vessel, had an anterior takeoff.  This vessel gave rise to a conus  branch, a right ventricular branch, a posterior descending branch, and a  small posterolateral branch.  This vessel is free of significant  disease.   The left ventriculogram:  The left ventriculogram performed on the RAO  projection showed vigorous wall motion with no areas of hypokinesis.  The estimated ejection fraction was 70%.   The right atrial pressure was 2 mean.  Pulmonary artery pressure was  22/8 with a mean of 13.  The pulmonary wedge pressure was 4 mean.  Left  ventricular pressure was 154/13.  The aortic pressure was 157/88 with a  mean of 120.  Cardiac output/cardiac index was 4.3/2.6  liters/minutes/meter squared.   CONCLUSION:  1. Normal coronary angiography and left ventricular wall motion.  2. Normal pulmonary artery pressure and pulmonary artery wedge      pressure.   RECOMMENDATIONS:  The patient has no source of  ischemia to explain her  recent symptoms.  She does have a positive D-dimer, and with these  recent symptoms, I think pulmonary embolism needs to be excluded and we  will arrange a CT angiogram tomorrow to evaluate this possibility.      Bruce Elvera Lennox Juanda Chance, MD, [facility]  Electronically Signed     BRB/MEDQ  D:  01/21/2008  T:  01/22/2008  Job:  [id number]   cc:   Lorin Picket A. Gerda Diss, MD  Verne Carrow, MD  Cardiopulmonary Laboratory  W. Simone Curia, M.D.

## 2010-11-22 LAB — DIFFERENTIAL
Basophils Absolute: 0 10*3/uL (ref 0.0–0.1)
Basophils Relative: 0 % (ref 0–1)
Eosinophils Absolute: 0 10*3/uL (ref 0.0–0.7)
Eosinophils Relative: 0 % (ref 0–5)
Lymphocytes Relative: 32 % (ref 12–46)
Lymphs Abs: 1.7 10*3/uL (ref 0.7–4.0)
Monocytes Absolute: 0.2 10*3/uL (ref 0.1–1.0)
Monocytes Relative: 4 % (ref 3–12)
Neutro Abs: 3.3 10*3/uL (ref 1.7–7.7)
Neutrophils Relative %: 64 % (ref 43–77)

## 2010-11-22 LAB — BASIC METABOLIC PANEL
BUN: 10 mg/dL (ref 6–23)
BUN: 13 mg/dL (ref 6–23)
CO2: 26 mEq/L (ref 19–32)
CO2: 26 mEq/L (ref 19–32)
Calcium: 8.4 mg/dL (ref 8.4–10.5)
Calcium: 9.8 mg/dL (ref 8.4–10.5)
Chloride: 106 mEq/L (ref 96–112)
Chloride: 109 mEq/L (ref 96–112)
Creatinine, Ser: 0.9 mg/dL (ref 0.4–1.2)
Creatinine, Ser: 0.98 mg/dL (ref 0.4–1.2)
GFR calc Af Amer: >60 mL/min (ref >60)
GFR calc Af Amer: >60 mL/min (ref >60)
GFR calc non Af Amer: 56 mL/min — ABNORMAL LOW (ref >60)
GFR calc non Af Amer: >60 mL/min (ref >60)
Glucose, Bld: 100 mg/dL — ABNORMAL HIGH (ref 70–99)
Glucose, Bld: 96 mg/dL (ref 70–99)
Potassium: 3.5 mEq/L (ref 3.5–5.1)
Potassium: 4.3 mEq/L (ref 3.5–5.1)
Sodium: 140 mEq/L (ref 135–145)
Sodium: 141 mEq/L (ref 135–145)

## 2010-11-22 LAB — CARDIAC PANEL(CRET KIN+CKTOT+MB+TROPI)
CK, MB: 1.7 ng/mL (ref 0.3–4.0)
CK, MB: 1.9 ng/mL (ref 0.3–4.0)
CK, MB: 2.3 ng/mL (ref 0.3–4.0)
Relative Index: INVALID (ref 0.0–2.5)
Relative Index: INVALID (ref 0.0–2.5)
Relative Index: INVALID (ref 0.0–2.5)
Total CK: 59 U/L (ref 7–177)
Total CK: 61 U/L (ref 7–177)
Total CK: 65 U/L (ref 7–177)
Troponin I: 0.02 ng/mL (ref 0.00–0.06)
Troponin I: 0.02 ng/mL (ref 0.00–0.06)
Troponin I: 0.04 ng/mL (ref 0.00–0.06)

## 2010-11-22 LAB — CBC
HCT: 35.2 % — ABNORMAL LOW (ref 36.0–46.0)
HCT: 43.1 % (ref 36.0–46.0)
Hemoglobin: 11.9 g/dL — ABNORMAL LOW (ref 12.0–15.0)
Hemoglobin: 14.9 g/dL (ref 12.0–15.0)
MCHC: 33.8 g/dL (ref 30.0–36.0)
MCHC: 34.5 g/dL (ref 30.0–36.0)
MCV: 88.8 fL (ref 78.0–100.0)
MCV: 90.5 fL (ref 78.0–100.0)
Platelets: 244 10*3/uL (ref 150–400)
Platelets: 284 10*3/uL (ref 150–400)
RBC: 3.89 MIL/uL (ref 3.87–5.11)
RBC: 4.85 MIL/uL (ref 3.87–5.11)
RDW: 13.6 % (ref 11.5–15.5)
RDW: 13.6 % (ref 11.5–15.5)
WBC: 5.2 10*3/uL (ref 4.0–10.5)
WBC: 5.7 10*3/uL (ref 4.0–10.5)

## 2010-11-22 LAB — TSH: TSH: 2.645 u[IU]/mL (ref 0.350–4.500)

## 2010-11-22 LAB — URINE MICROSCOPIC-ADD ON

## 2010-11-22 LAB — POCT I-STAT 3, VENOUS BLOOD GAS (G3P V)
Bicarbonate: 25.8 mEq/L — ABNORMAL HIGH (ref 20.0–24.0)
O2 Saturation: 73 %
TCO2: 27 mmol/L (ref 0–100)
pCO2, Ven: 43.6 mmHg — ABNORMAL LOW (ref 45.0–50.0)
pH, Ven: 7.381 — ABNORMAL HIGH (ref 7.250–7.300)
pO2, Ven: 40 mmHg (ref 30.0–45.0)

## 2010-11-22 LAB — URINALYSIS, ROUTINE W REFLEX MICROSCOPIC
Bilirubin Urine: NEGATIVE
Glucose, UA: NEGATIVE mg/dL
Hgb urine dipstick: NEGATIVE
Ketones, ur: NEGATIVE mg/dL
Nitrite: NEGATIVE
Protein, ur: NEGATIVE mg/dL
Specific Gravity, Urine: 1.007 (ref 1.005–1.030)
Urobilinogen, UA: 0.2 mg/dL (ref 0.0–1.0)
pH: 6 (ref 5.0–8.0)

## 2010-11-22 LAB — LIPID PANEL
Cholesterol: 161 mg/dL (ref 0–200)
HDL: 40 mg/dL (ref >39)
LDL Cholesterol: 103 mg/dL — ABNORMAL HIGH (ref 0–99)
Total CHOL/HDL Ratio: 4 RATIO
Triglycerides: 89 mg/dL (ref <150)
VLDL: 18 mg/dL (ref 0–40)

## 2010-11-22 LAB — MAGNESIUM: Magnesium: 2.3 mg/dL (ref 1.5–2.5)

## 2010-11-22 LAB — CK TOTAL AND CKMB (NOT AT ARMC)
CK, MB: 2.5 ng/mL (ref 0.3–4.0)
Relative Index: 2.4 (ref 0.0–2.5)
Total CK: 106 U/L (ref 7–177)

## 2010-11-22 LAB — PROTIME-INR
INR: 1 (ref 0.00–1.49)
Prothrombin Time: 13 seconds (ref 11.6–15.2)

## 2010-11-22 LAB — TROPONIN I: Troponin I: <0.01 ng/mL (ref 0.00–0.06)

## 2010-11-22 LAB — D-DIMER, QUANTITATIVE: D-Dimer, Quant: 0.81 ug/mL-FEU — ABNORMAL HIGH (ref 0.00–0.48)

## 2010-11-22 LAB — APTT: aPTT: 26 seconds (ref 24–37)

## 2011-04-30 ENCOUNTER — Other Ambulatory Visit: Payer: Self-pay | Admitting: Family Medicine

## 2011-04-30 DIAGNOSIS — Z139 Encounter for screening, unspecified: Secondary | ICD-10-CM

## 2011-05-15 ENCOUNTER — Ambulatory Visit (HOSPITAL_COMMUNITY)
Admission: RE | Admit: 2011-05-15 | Discharge: 2011-05-15 | Disposition: A | Discharge status: Home / Self Care | Payer: Medicare Other | Source: Ambulatory Visit | Attending: Family Medicine | Admitting: Family Medicine

## 2011-05-15 DIAGNOSIS — Z139 Encounter for screening, unspecified: Secondary | ICD-10-CM

## 2011-05-15 DIAGNOSIS — Z1231 Encounter for screening mammogram for malignant neoplasm of breast: Secondary | ICD-10-CM | POA: Insufficient documentation

## 2011-06-12 ENCOUNTER — Ambulatory Visit (INDEPENDENT_AMBULATORY_CARE_PROVIDER_SITE_OTHER): Payer: Medicare Other | Admitting: Orthopedic Surgery

## 2011-06-12 ENCOUNTER — Encounter: Payer: Self-pay | Admitting: Orthopedic Surgery

## 2011-06-12 VITALS — BP 146/80 | Ht 62.5 in | Wt 139.0 lb

## 2011-06-12 DIAGNOSIS — G5730 Lesion of lateral popliteal nerve, unspecified lower limb: Secondary | ICD-10-CM | POA: Insufficient documentation

## 2011-06-12 NOTE — Patient Instructions (Signed)
Call us to schedule surgery / no appointment needed   Right leg superficial peroneal nerve release

## 2011-06-12 NOTE — Progress Notes (Signed)
Patient ID: Katherine Lane, female   DOB: 1932/06/19, 78 y.o.   MRN: [id number] Chief Complaint  Patient presents with  . Follow-up    recheck cyst on right ankle    Recheck cyst over the RIGHT ankle joint, status post internal fixation for RIGHT ankle fracture.  The patient plays a burning pain in the foot radiating proximally, especially after activity.  She has a positive Tinel's at the proximal portion of the incision and tenderness along the superficial peroneal nerve.  We have decided to do a superficial peroneal nerve release. She will call us to schedule the surgery. No appointment necessary. Risks and benefits have been explained.

## 2011-06-30 ENCOUNTER — Telehealth: Payer: Self-pay | Admitting: Orthopedic Surgery

## 2011-06-30 NOTE — Telephone Encounter (Signed)
Katherine Lane, please call [name]. Lindahl, she is ready to schedule her surgery.  Her last note said no appointment needed. Her # [phone]

## 2011-07-01 ENCOUNTER — Other Ambulatory Visit: Payer: Self-pay | Admitting: *Deleted

## 2011-07-01 NOTE — Telephone Encounter (Signed)
Surgery scheduled and patient aware 

## 2011-07-02 ENCOUNTER — Encounter (HOSPITAL_COMMUNITY): Payer: Self-pay

## 2011-07-09 NOTE — Patient Instructions (Addendum)
[address] Katherine [address]  07/09/2011   Your procedure is scheduled on:  07/18/11  Report to [facility] at  [place]  AM.  Call this number if you have problems the morning of surgery: [phone]   Remember:   Do not eat food:After Midnight.  May have clear liquids:until Midnight .  Marland Kitchen  Take these medicines the morning of surgery with A SIP OF WATER Vasotec,hctz,metoprolol,calan   Do not wear jewelry, make-up or nail polish.  Do not wear lotions, powders, or perfumes. You may wear deodorant.  Do not shave 48 hours prior to surgery. Men may shave face and neck.  Do not bring valuables to the hospital.  Contacts, dentures or bridgework may not be worn into surgery.  Leave suitcase in the car. After surgery it may be brought to your room.  For patients admitted to the hospital, checkout time is 11:00 AM the day of discharge.   Patients discharged the day of surgery will not be allowed to drive home.  Name and phone number of your driver: family  Special Instructions: CHG Shower Use Special Wash: 1/2 bottle night before surgery and 1/2 bottle morning of surgery.   Please read over the following fact sheets that you were given: Pain Booklet, MRSA Information, Surgical Site Infection Prevention, Anesthesia Post-op Instructions and Care and Recovery After Surgery PATIENT INSTRUCTIONS POST-ANESTHESIA  IMMEDIATELY FOLLOWING SURGERY:  Do not drive or operate machinery for the first twenty four hours after surgery.  Do not make any important decisions for twenty four hours after surgery or while taking narcotic pain medications or sedatives.  If you develop intractable nausea and vomiting or a severe headache please notify your doctor immediately.  FOLLOW-UP:  Please make an appointment with your surgeon as instructed. You do not need to follow up with anesthesia unless specifically instructed to do so.  WOUND CARE INSTRUCTIONS (if applicable):  Keep a dry clean dressing on the anesthesia/puncture wound site if  there is drainage.  Once the wound has quit draining you may leave it open to air.  Generally you should leave the bandage intact for twenty four hours unless there is drainage.  If the epidural site drains for more than 36-48 hours please call the anesthesia departmen

## 2011-07-10 ENCOUNTER — Encounter (HOSPITAL_COMMUNITY)
Admission: RE | Admit: 2011-07-10 | Discharge: 2011-07-10 | Disposition: A | Discharge status: Home / Self Care | Payer: Medicare Other | Source: Ambulatory Visit | Attending: Orthopedic Surgery | Admitting: Orthopedic Surgery

## 2011-07-10 ENCOUNTER — Encounter (HOSPITAL_COMMUNITY): Payer: Self-pay

## 2011-07-10 ENCOUNTER — Other Ambulatory Visit: Payer: Self-pay

## 2011-07-10 ENCOUNTER — Telehealth: Payer: Self-pay | Admitting: Orthopedic Surgery

## 2011-07-10 HISTORY — DX: Other specified postprocedural states: R11.2

## 2011-07-10 HISTORY — DX: Other specified postprocedural states: Z98.890

## 2011-07-10 HISTORY — DX: Unspecified osteoarthritis, unspecified site: M19.90

## 2011-07-10 LAB — BASIC METABOLIC PANEL
BUN: 17 mg/dL (ref 6–23)
CO2: 27 mEq/L (ref 19–32)
Calcium: 9.8 mg/dL (ref 8.4–10.5)
Chloride: 107 mEq/L (ref 96–112)
Creatinine, Ser: 0.92 mg/dL (ref 0.50–1.10)
GFR calc Af Amer: 68 mL/min — ABNORMAL LOW (ref >90)
GFR calc non Af Amer: 59 mL/min — ABNORMAL LOW (ref >90)
Glucose, Bld: 121 mg/dL — ABNORMAL HIGH (ref 70–99)
Potassium: 4 mEq/L (ref 3.5–5.1)
Sodium: 144 mEq/L (ref 135–145)

## 2011-07-10 LAB — HEMOGLOBIN AND HEMATOCRIT, BLOOD
HCT: 40.2 % (ref 36.0–46.0)
Hemoglobin: 13.3 g/dL (ref 12.0–15.0)

## 2011-07-10 LAB — SURGICAL PCR SCREEN
MRSA, PCR: NEGATIVE
Staphylococcus aureus: NEGATIVE

## 2011-07-10 NOTE — Telephone Encounter (Signed)
RE: Out-patient surgery scheduled 07/18/11 at [facility] - Per Medicare guidelines, No pre-authorization required for surgery.  RE: Secondary insurer, [employer], no pre-authorization required as they follow Medicare guidelines.

## 2011-07-17 NOTE — H&P (Signed)
Katherine Lane is an 78 y.o. female.   Chief Complaint: pain right leg  HPI: s/p OTIF right ankle did well initially. She has noticed pain and paresthesias over the proximal part of the incision and into the dorsum of the foot   Past Medical History  Diagnosis Date  . HTN (hypertension)   . Osteoporosis   . Constipation, chronic   . PONV (postoperative nausea and vomiting)   . Arthritis     Past Surgical History  Procedure Date  . Partial hysterectomy   . Ankle fracture surgery     2010-right    Family History  Problem Relation Age of Onset  . Alzheimer's disease    . Cancer    . Anesthesia problems Neg Hx   . Hypotension Neg Hx   . Malignant hyperthermia Neg Hx   . Pseudochol deficiency Neg Hx    Social History:  reports that she has never smoked. She does not have any smokeless tobacco history on file. She reports that she does not drink alcohol or use illicit drugs.  Allergies:  Allergies  Allergen Reactions  . Codeine Nausea And Vomiting    No prescriptions prior to admission    No results found for this or any previous visit (from the past 48 hour(s)). No results found.  Review of Systems  Musculoskeletal: Positive for joint pain.  Neurological: Positive for tingling.  All other systems reviewed and are negative.    There were no vitals taken for this visit. Physical Exam  Constitutional: She is oriented to person, place, and time. She appears well-developed and well-nourished.  HENT:  Head: Normocephalic and atraumatic.  Neck: Normal range of motion. Neck supple. No tracheal deviation present.  Cardiovascular: Normal rate.   Respiratory: Effort normal.  GI: Soft.  Musculoskeletal:       Right shoulder: Normal.       Left shoulder: Normal.       Right hip: Normal.       Left hip: Normal.       Right knee: Normal.       Left knee: Normal.       Right ankle: She exhibits normal range of motion, no swelling, no ecchymosis, no deformity, no  laceration and normal pulse. tenderness. No lateral malleolus, no medial malleolus, no AITFL, no CF ligament, no posterior TFL and no proximal fibula tenderness found. Achilles tendon exhibits no pain, no defect and normal Thompson's test results.       Left ankle: Normal.       Tinel's positive over the superficial peroneal nerve   Lymphadenopathy:    She has no cervical adenopathy.  Neurological: She is alert and oriented to person, place, and time. She has normal reflexes.  Skin: Skin is warm and dry.  Psychiatric: She has a normal mood and affect. Her behavior is normal. Judgment and thought content normal.     Assessment/Plan superf peron nerve entrapment right leg   Release superficial peron nerve right leg   Fuller Canada 07/17/2011, 3:22 PM

## 2011-07-18 ENCOUNTER — Encounter (HOSPITAL_COMMUNITY): Payer: Self-pay | Admitting: Anesthesiology

## 2011-07-18 ENCOUNTER — Ambulatory Visit (HOSPITAL_COMMUNITY)
Admission: RE | Admit: 2011-07-18 | Discharge: 2011-07-18 | Disposition: A | Discharge status: Home / Self Care | Payer: Medicare Other | Source: Ambulatory Visit | Attending: Orthopedic Surgery | Admitting: Orthopedic Surgery

## 2011-07-18 ENCOUNTER — Ambulatory Visit (HOSPITAL_COMMUNITY): Payer: Medicare Other | Admitting: Anesthesiology

## 2011-07-18 ENCOUNTER — Encounter (HOSPITAL_COMMUNITY): Payer: Self-pay | Admitting: *Deleted

## 2011-07-18 ENCOUNTER — Encounter (HOSPITAL_COMMUNITY)
Admission: RE | Disposition: A | Discharge status: Home / Self Care | Payer: Self-pay | Source: Ambulatory Visit | Attending: Orthopedic Surgery

## 2011-07-18 DIAGNOSIS — Z01812 Encounter for preprocedural laboratory examination: Secondary | ICD-10-CM | POA: Insufficient documentation

## 2011-07-18 DIAGNOSIS — G5730 Lesion of lateral popliteal nerve, unspecified lower limb: Secondary | ICD-10-CM

## 2011-07-18 DIAGNOSIS — T84498A Other mechanical complication of other internal orthopedic devices, implants and grafts, initial encounter: Secondary | ICD-10-CM

## 2011-07-18 DIAGNOSIS — Z472 Encounter for removal of internal fixation device: Secondary | ICD-10-CM | POA: Insufficient documentation

## 2011-07-18 DIAGNOSIS — Z01810 Encounter for preprocedural cardiovascular examination: Secondary | ICD-10-CM | POA: Insufficient documentation

## 2011-07-18 DIAGNOSIS — I10 Essential (primary) hypertension: Secondary | ICD-10-CM | POA: Insufficient documentation

## 2011-07-18 HISTORY — PX: NERVE REPAIR: SHX2083

## 2011-07-18 HISTORY — PX: HARDWARE REMOVAL: SHX979

## 2011-07-18 SURGERY — REPAIR, NERVE
Anesthesia: Spinal | Site: Ankle | Laterality: Right | Wound class: Clean

## 2011-07-18 MED ORDER — CHLORHEXIDINE GLUCONATE 4 % EX LIQD
60.0000 mL | Freq: Once | CUTANEOUS | Status: DC
  Filled 2011-07-18: qty 60

## 2011-07-18 MED ORDER — BUPIVACAINE IN DEXTROSE 0.75-8.25 % IT SOLN
INTRATHECAL | Status: AC
  Filled 2011-07-18: qty 2

## 2011-07-18 MED ORDER — LACTATED RINGERS IV SOLN
INTRAVENOUS | Status: DC | PRN
  Administered 2011-07-18 (×2): 1000 mL
  Administered 2011-07-18 (×2): via INTRAVENOUS

## 2011-07-18 MED ORDER — BUPIVACAINE HCL 0.75 % IJ SOLN
INTRAMUSCULAR | Status: DC | PRN
  Administered 2011-07-18: 1.8 mL

## 2011-07-18 MED ORDER — HYDROMORPHONE HCL 4 MG PO TABS: 4.0000 mg | ORAL_TABLET | Freq: Two times a day (BID) | ORAL | Status: DC | PRN

## 2011-07-18 MED ORDER — CEFAZOLIN SODIUM 1-5 GM-% IV SOLN
INTRAVENOUS | Status: DC | PRN
  Administered 2011-07-18: 1 g via INTRAVENOUS

## 2011-07-18 MED ORDER — PROMETHAZINE HCL 12.5 MG PO TABS: 12.5000 mg | ORAL_TABLET | Freq: Four times a day (QID) | ORAL | Status: DC | PRN

## 2011-07-18 MED ORDER — FENTANYL CITRATE 0.05 MG/ML IJ SOLN
INTRAMUSCULAR | Status: DC | PRN
  Administered 2011-07-18: 20 ug via INTRATHECAL

## 2011-07-18 MED ORDER — ONDANSETRON HCL 4 MG/2ML IJ SOLN
4.0000 mg | Freq: Once | INTRAMUSCULAR | Status: AC
  Administered 2011-07-18: 4 mg via INTRAVENOUS

## 2011-07-18 MED ORDER — GLYCOPYRROLATE 0.2 MG/ML IJ SOLN
0.2000 mg | Freq: Once | INTRAMUSCULAR | Status: AC
  Administered 2011-07-18: 0.2 mg via INTRAVENOUS

## 2011-07-18 MED ORDER — MIDAZOLAM HCL 2 MG/2ML IJ SOLN
INTRAMUSCULAR | Status: AC
  Administered 2011-07-18: 2 mg via INTRAVENOUS
  Filled 2011-07-18: qty 2

## 2011-07-18 MED ORDER — IBUPROFEN 800 MG PO TABS: 800.0000 mg | ORAL_TABLET | Freq: Three times a day (TID) | ORAL | Status: DC | PRN

## 2011-07-18 MED ORDER — GLYCOPYRROLATE 0.2 MG/ML IJ SOLN
INTRAMUSCULAR | Status: AC
  Administered 2011-07-18: 0.2 mg via INTRAVENOUS
  Filled 2011-07-18: qty 1

## 2011-07-18 MED ORDER — CEFAZOLIN SODIUM 1-5 GM-% IV SOLN: 1.0000 g | INTRAVENOUS | Status: DC

## 2011-07-18 MED ORDER — PROPOFOL 10 MG/ML IV EMUL
INTRAVENOUS | Status: DC | PRN
  Administered 2011-07-18: 50 ug/kg/min via INTRAVENOUS

## 2011-07-18 MED ORDER — MIDAZOLAM HCL 2 MG/2ML IJ SOLN
1.0000 mg | INTRAMUSCULAR | Status: DC | PRN
  Administered 2011-07-18: 2 mg via INTRAVENOUS

## 2011-07-18 MED ORDER — ONDANSETRON HCL 4 MG/2ML IJ SOLN
INTRAMUSCULAR | Status: AC
  Administered 2011-07-18: 4 mg via INTRAVENOUS
  Filled 2011-07-18: qty 2

## 2011-07-18 MED ORDER — CEFAZOLIN SODIUM 1-5 GM-% IV SOLN
INTRAVENOUS | Status: AC
  Filled 2011-07-18: qty 50

## 2011-07-18 MED ORDER — LIDOCAINE HCL (PF) 1 % IJ SOLN
INTRAMUSCULAR | Status: AC
  Filled 2011-07-18: qty 5

## 2011-07-18 MED ORDER — MIDAZOLAM HCL 2 MG/2ML IJ SOLN
INTRAMUSCULAR | Status: AC
  Filled 2011-07-18: qty 2

## 2011-07-18 MED ORDER — PROPOFOL 10 MG/ML IV EMUL
INTRAVENOUS | Status: AC
  Filled 2011-07-18: qty 20

## 2011-07-18 MED ORDER — EPHEDRINE SULFATE 50 MG/ML IJ SOLN
INTRAMUSCULAR | Status: AC
  Filled 2011-07-18: qty 1

## 2011-07-18 MED ORDER — FENTANYL CITRATE 0.05 MG/ML IJ SOLN
INTRAMUSCULAR | Status: AC
  Filled 2011-07-18: qty 2

## 2011-07-18 MED ORDER — BUPIVACAINE-EPINEPHRINE 0.5% -1:200000 IJ SOLN
INTRAMUSCULAR | Status: DC | PRN
  Administered 2011-07-18: 30 mL

## 2011-07-18 MED ORDER — BUPIVACAINE-EPINEPHRINE PF 0.5-1:200000 % IJ SOLN
INTRAMUSCULAR | Status: AC
  Filled 2011-07-18: qty 20

## 2011-07-18 MED ORDER — ACETAMINOPHEN 325 MG PO TABS: 325.0000 mg | ORAL_TABLET | ORAL | Status: DC | PRN

## 2011-07-18 MED ORDER — EPHEDRINE SULFATE 50 MG/ML IJ SOLN
INTRAMUSCULAR | Status: DC | PRN
  Administered 2011-07-18: 10 mg via INTRAVENOUS

## 2011-07-18 MED ORDER — LIDOCAINE HCL (CARDIAC) 10 MG/ML IV SOLN
INTRAVENOUS | Status: DC | PRN
  Administered 2011-07-18: 50 mg via INTRAVENOUS

## 2011-07-18 MED ORDER — FENTANYL CITRATE 0.05 MG/ML IJ SOLN
INTRAMUSCULAR | Status: DC | PRN
  Administered 2011-07-18: 25 ug via INTRAVENOUS
  Administered 2011-07-18: 30 ug via INTRAVENOUS
  Administered 2011-07-18: 25 ug via INTRAVENOUS

## 2011-07-18 MED ORDER — FENTANYL CITRATE 0.05 MG/ML IJ SOLN: 25.0000 ug | INTRAMUSCULAR | Status: DC | PRN

## 2011-07-18 MED ORDER — MIDAZOLAM HCL 5 MG/5ML IJ SOLN
INTRAMUSCULAR | Status: DC | PRN
  Administered 2011-07-18: 2 mg via INTRAVENOUS

## 2011-07-18 MED ORDER — ONDANSETRON HCL 4 MG/2ML IJ SOLN
4.0000 mg | Freq: Once | INTRAMUSCULAR | Status: AC | PRN
  Administered 2011-07-18: 4 mg via INTRAVENOUS

## 2011-07-18 MED ORDER — LACTATED RINGERS IV SOLN: INTRAVENOUS | Status: DC

## 2011-07-18 MED ORDER — SODIUM CHLORIDE 0.9 % IR SOLN
Status: DC | PRN
Start: 2011-07-18 — End: 2011-07-18
  Administered 2011-07-18 (×2): 1000 mL

## 2011-07-18 SURGICAL SUPPLY — 40 items
BAG HAMPER (MISCELLANEOUS) ×2 IMPLANT
BANDAGE ELASTIC 4 VELCRO NS (GAUZE/BANDAGES/DRESSINGS) ×2 IMPLANT
BANDAGE ELASTIC 6 VELCRO NS (GAUZE/BANDAGES/DRESSINGS) ×2 IMPLANT
BANDAGE ESMARK 4X12 BL STRL LF (DISPOSABLE) ×1 IMPLANT
BNDG ESMARK 4X12 BLUE STRL LF (DISPOSABLE) ×2
CHLORAPREP W/TINT 26ML (MISCELLANEOUS) ×2 IMPLANT
CLOTH BEACON ORANGE TIMEOUT ST (SAFETY) ×2 IMPLANT
COVER LIGHT HANDLE STERIS (MISCELLANEOUS) ×4 IMPLANT
DECANTER SPIKE VIAL GLASS SM (MISCELLANEOUS) ×2 IMPLANT
GAUZE XEROFORM 5X9 LF (GAUZE/BANDAGES/DRESSINGS) ×2 IMPLANT
GLOVE ECLIPSE 6.5 STRL STRAW (GLOVE) ×6 IMPLANT
GLOVE EXAM NITRILE MD LF STRL (GLOVE) ×2 IMPLANT
GLOVE INDICATOR 7.0 STRL GRN (GLOVE) ×4 IMPLANT
GLOVE SKINSENSE NS SZ8.0 LF (GLOVE) ×1
GLOVE SKINSENSE STRL SZ8.0 LF (GLOVE) ×1 IMPLANT
GLOVE SS N UNI LF 8.5 STRL (GLOVE) ×2 IMPLANT
GOWN STRL REIN XL XLG (GOWN DISPOSABLE) ×8 IMPLANT
INST SET MINOR BONE (KITS) ×2 IMPLANT
IV NS IRRIG 3000ML ARTHROMATIC (IV SOLUTION) IMPLANT
KIT ROOM TURNOVER APOR (KITS) ×2 IMPLANT
MANIFOLD NEPTUNE II (INSTRUMENTS) ×2 IMPLANT
MARKER SKIN DUAL TIP RULER LAB (MISCELLANEOUS) ×2 IMPLANT
NEEDLE HYPO 18GX1.5 BLUNT FILL (NEEDLE) ×2 IMPLANT
NEEDLE HYPO 21X1.5 SAFETY (NEEDLE) ×2 IMPLANT
NS IRRIG 1000ML POUR BTL (IV SOLUTION) ×2 IMPLANT
PACK BASIC LIMB (CUSTOM PROCEDURE TRAY) ×2 IMPLANT
PAD ARMBOARD 7.5X6 YLW CONV (MISCELLANEOUS) ×2 IMPLANT
PAD CAST 4YDX4 CTTN HI CHSV (CAST SUPPLIES) ×1 IMPLANT
PADDING CAST COTTON 4X4 STRL (CAST SUPPLIES) ×1
SET BASIN LINEN APH (SET/KITS/TRAYS/PACK) ×2 IMPLANT
SPONGE GAUZE 4X4 12PLY (GAUZE/BANDAGES/DRESSINGS) ×2 IMPLANT
SPONGE LAP 18X18 X RAY DECT (DISPOSABLE) ×4 IMPLANT
STAPLER VISISTAT 35W (STAPLE) ×2 IMPLANT
SUT MON AB 2-0 SH 27 (SUTURE) ×1
SUT MON AB 2-0 SH27 (SUTURE) ×1 IMPLANT
SUT MON AB 3-0 SH 27 (SUTURE) IMPLANT
SUT VIC AB 1 CT1 27 (SUTURE)
SUT VIC AB 1 CT1 27XBRD ANTBC (SUTURE) IMPLANT
SYR 20CC LL (SYRINGE) ×2 IMPLANT
SYR BULB IRRIGATION 50ML (SYRINGE) ×4 IMPLANT

## 2011-07-18 NOTE — Preoperative (Signed)
Beta Blockers   Reason not to administer Beta Blockers:Not Applicable 

## 2011-07-18 NOTE — Op Note (Signed)
07/18/2011  8:54 AM  PATIENT:  Katherine Lane  78 y.o. female  PRE-OPERATIVE DIAGNOSIS:  Right leg superficial peroneal nerve entrapment  POST-OPERATIVE DIAGNOSIS:  Right leg superficial peroneal nerve entrapment   PROCEDURE:  Procedure(s) (LRB):SUPERFICIAL PERONEAL  NERVE RELEASE (Right) HARDWARE REMOVAL (Right)  FINDINGS: ENTRAPMENT OF THE SUPERFICIAL PERONEAL NERVE AROUND THE SCREW AND HETEROTOPIC BONE   DETAILS:  Procedure was performed In the following manner: The site was confirmed him back in the preop area. The patient was then taken to the operating room and given a spinal anesthetic. An antibiotic was administered intravenously. A sandbag was placed under the right hip. The right leg was then prepped and draped from the toes to just above the knee. The timeout procedure was executed  The limb was exsanguinated with a 4 inch Esmarch and the tourniquet was inflated to 300 mm of mercury. Using the previous incision the proximal portion of it was opened and extended more proximally;  Blount dissection was carried out to identify the superficial peroneal nerve which was found to be entrapped around the proximal screw and heterotopic bone which had grown over the plate.   An osteotome was used to the bone from the plate and screws were removed and then a was placed more anteriorly. The wound is irrigated and closed with a running 2-0 Monocryl and staples. 30 cc of Marcaine with epinephrine was injected into the skin incision. The tourniquet was released the dressing was applied and the patient was taken to the operating room in stable condition  The postoperative plan is to place the patient in an Aircast allowed weightbearing as tolerated and remove the staples in 14 days.  SURGEON:  Surgeon(s) and Role:    * Vickki Hearing, MD - Primary  PHYSICIAN ASSISTANT:   ASSISTANTS: none   ANESTHESIA:   spinal  EBL:  Total I/O In: 1000 [I.V.:1000] Out: -   BLOOD  ADMINISTERED:none  DRAINS: none   LOCAL MEDICATIONS USED:  MARCAINE   , Amount: 30 ml and OTHER EPI  SPECIMEN:  No Specimen  DISPOSITION OF SPECIMEN:  N/A  COUNTS:  YES  TOURNIQUET:   Total Tourniquet Time Documented: Thigh (Right) - 37 minutes  DICTATION: .Dragon Dictation  PLAN OF CARE: Discharge to home after PACU  PATIENT DISPOSITION:  PACU - hemodynamically stable.   Delay start of Pharmacological VTE agent (>24hrs) due to surgical blood loss or risk of bleeding: not applicable

## 2011-07-18 NOTE — Addendum Note (Signed)
Addendum  created 07/18/11 0949 by Roselie Awkward, MD   Modules edited:Orders

## 2011-07-18 NOTE — Transfer of Care (Signed)
Immediate Anesthesia Transfer of Care Note  Patient: Katherine Lane  Procedure(s) Performed: Procedure(s) (LRB): NERVE REPAIR (Right) HARDWARE REMOVAL (Right)  Patient Location: PACU  Anesthesia Type: Spinal  Level of Consciousness: awake, alert , oriented and patient cooperative  Airway & Oxygen Therapy: Patient Spontanous Breathing and Patient connected to face mask oxygen  Post-op Assessment: Report given to PACU RN and Post -op Vital signs reviewed and stable  Post vital signs: Reviewed and stable  Complications: No apparent anesthesia complications

## 2011-07-18 NOTE — Anesthesia Procedure Notes (Addendum)
Spinal  Patient location during procedure: OR Start time: 07/18/2011 7:49 AM Staffing CRNA/Resident: ANDRAZA, Dacari Beckstrand L Preanesthetic Checklist Completed: patient identified, site marked, surgical consent, pre-op evaluation, timeout performed, IV checked, risks and benefits discussed and monitors and equipment checked Spinal Block Patient position: right lateral decubitus Prep: Betadine Patient monitoring: heart rate, cardiac monitor, continuous pulse ox and blood pressure Approach: right paramedian Location: L3-4 Injection technique: single-shot Needle Needle type: Spinocan  Needle gauge: 22 G Needle length: 9 cm Assessment Sensory level: T8 Additional Notes ATTEMPTS:1 TRAY [id number] TRAY EXPIRATION DATE:12/2011  .75% Bupivacaine 1.20ml Fentanyl 20 mcg Epi .1 injected intrathecally @ 0749  Date/Time: 07/18/2011 7:38 AM Performed by: Carolyne Littles, Amrom Ore L Pre-anesthesia Checklist: Patient identified, Timeout performed, Emergency Drugs available, Suction available and Patient being monitored Oxygen Delivery Method: Non-rebreather mask

## 2011-07-18 NOTE — Brief Op Note (Signed)
07/18/2011  8:54 AM  PATIENT:  Katherine Lane  78 y.o. female  PRE-OPERATIVE DIAGNOSIS:  Right leg superficial peroneal nerve entrapment  POST-OPERATIVE DIAGNOSIS:  Right leg superficial peroneal nerve entrapment   PROCEDURE:  Procedure(s) (LRB):SUPERFICIAL PERONEAL  NERVE RELEASE (Right) HARDWARE REMOVAL (Right)  FINDINGS: ENTRAPMENT OF THE SUPERFICIAL PERONEAL NERVE AROUND THE SCREW AND HETEROTOPIC BONE   SURGEON:  Surgeon(s) and Role:    * Vickki Hearing, MD - Primary  PHYSICIAN ASSISTANT:   ASSISTANTS: none   ANESTHESIA:   spinal  EBL:  Total I/O In: 1000 [I.V.:1000] Out: -   BLOOD ADMINISTERED:none  DRAINS: none   LOCAL MEDICATIONS USED:  MARCAINE   , Amount: 30 ml and OTHER EPI  SPECIMEN:  No Specimen  DISPOSITION OF SPECIMEN:  N/A  COUNTS:  YES  TOURNIQUET:   Total Tourniquet Time Documented: Thigh (Right) - 37 minutes  DICTATION: .Dragon Dictation  PLAN OF CARE: Discharge to home after PACU  PATIENT DISPOSITION:  PACU - hemodynamically stable.   Delay start of Pharmacological VTE agent (>24hrs) due to surgical blood loss or risk of bleeding: not applicable

## 2011-07-18 NOTE — Anesthesia Postprocedure Evaluation (Signed)
  Anesthesia Post-op Note  Patient: Katherine Lane  Procedure(s) Performed: Procedure(s) (LRB): NERVE REPAIR (Right) HARDWARE REMOVAL (Right)  Patient Location: PACU  Anesthesia Type: Spinal  Level of Consciousness: awake, alert , oriented and patient cooperative  Airway and Oxygen Therapy: Patient Spontanous Breathing and Patient connected to face mask oxygen  Post-op Pain: none  Post-op Assessment: Post-op Vital signs reviewed, Patient's Cardiovascular Status Stable and Respiratory Function Stable  Post-op Vital Signs: Reviewed and stable  Complications: No apparent anesthesia complications

## 2011-07-18 NOTE — Anesthesia Preprocedure Evaluation (Signed)
Anesthesia Evaluation  Patient identified by MRN, date of birth, ID band Patient awake    Reviewed: Allergy & Precautions, H&P , NPO status , Patient's Chart, lab work & pertinent test results, reviewed documented beta blocker date and time   History of Anesthesia Complications (+) PONV  Airway Mallampati: I TM Distance: >3 FB Neck ROM: Full    Dental No notable dental hx. (+) Partial Upper   Pulmonary    Pulmonary exam normal       Cardiovascular hypertension, Pt. on medications and Pt. on home beta blockers Rhythm:Regular Rate:Normal     Neuro/Psych  Neuromuscular disease    GI/Hepatic negative GI ROS, Neg liver ROS,   Endo/Other  negative endocrine ROS  Renal/GU negative Renal ROS     Musculoskeletal negative musculoskeletal ROS (+)   Abdominal Normal abdominal exam  (+)   Peds  Hematology negative hematology ROS (+)   Anesthesia Other Findings   Reproductive/Obstetrics negative OB ROS                           Anesthesia Physical Anesthesia Plan  ASA: II  Anesthesia Plan: Spinal   Post-op Pain Management:    Induction: Intravenous  Airway Management Planned: Nasal Cannula  Additional Equipment:   Intra-op Plan:   Post-operative Plan:   Informed Consent: I have reviewed the patients History and Physical, chart, labs and discussed the procedure including the risks, benefits and alternatives for the proposed anesthesia with the patient or authorized representative who has indicated his/her understanding and acceptance.     Plan Discussed with: CRNA  Anesthesia Plan Comments:         Anesthesia Quick Evaluation

## 2011-07-18 NOTE — Interval H&P Note (Signed)
History and Physical Interval Note:  07/18/2011 7:30 AM  Katherine Lane  has presented today for surgery, with the diagnosis of Right leg superficial peroneal nerve  The various methods of treatment have been discussed with the patient and family. After consideration of risks, benefits and other options for treatment, the patient has consented to  Procedure(s) (LRB): NERVE RELEASE (Right) LEG/SUPERFICIAL PERONEAL NERVE as a surgical intervention .  The patients' history has been reviewed, patient examined, no change in status, stable for surgery.  I have reviewed the patients' chart and labs.  Questions were answered to the patient's satisfaction.     Fuller Canada

## 2011-07-21 ENCOUNTER — Encounter: Payer: Self-pay | Admitting: Orthopedic Surgery

## 2011-07-21 ENCOUNTER — Ambulatory Visit (INDEPENDENT_AMBULATORY_CARE_PROVIDER_SITE_OTHER): Payer: Medicare Other | Admitting: Orthopedic Surgery

## 2011-07-21 VITALS — BP 110/60 | Ht 62.5 in | Wt 139.0 lb

## 2011-07-21 DIAGNOSIS — T84498A Other mechanical complication of other internal orthopedic devices, implants and grafts, initial encounter: Secondary | ICD-10-CM | POA: Insufficient documentation

## 2011-07-21 NOTE — Progress Notes (Addendum)
Patient ID: Katherine Lane, female   DOB: June 09, 1932, 78 y.o.   MRN: [id number] Chief Complaint  Patient presents with  . Routine Post Op    post op 1 superficial peroneal nerve release, DOS 07/18/11    BP 110/60  Ht 5' 2.5" (1.588 m)  Wt 139 lb (63.05 kg)  BMI 25.02 kg/m2  She is doing well dressing is changed and new dressing applied using a Tegaderm Aircast for 3 weeks come back next week to get staples out  PRE-OPERATIVE DIAGNOSIS: Right leg superficial peroneal nerve entrapment  POST-OPERATIVE DIAGNOSIS: Right leg superficial peroneal nerve entrapment  PROCEDURE: Procedure(s) (LRB):SUPERFICIAL PERONEAL  NERVE RELEASE (Right)  HARDWARE REMOVAL (Right)  FINDINGS: ENTRAPMENT OF THE SUPERFICIAL PERONEAL NERVE AROUND THE SCREW AND HETEROTOPIC BONE

## 2011-07-21 NOTE — Patient Instructions (Signed)
Wear brace x 3 week s

## 2011-07-22 ENCOUNTER — Encounter (HOSPITAL_COMMUNITY): Payer: Self-pay | Admitting: Orthopedic Surgery

## 2011-07-24 ENCOUNTER — Telehealth: Payer: Self-pay | Admitting: Orthopedic Surgery

## 2011-07-24 ENCOUNTER — Encounter (HOSPITAL_COMMUNITY): Payer: Self-pay | Admitting: *Deleted

## 2011-07-24 ENCOUNTER — Emergency Department (HOSPITAL_COMMUNITY)
Admission: EM | Admit: 2011-07-24 | Discharge: 2011-07-24 | Disposition: A | Discharge status: Home / Self Care | Payer: Medicare Other | Attending: Emergency Medicine | Admitting: Emergency Medicine

## 2011-07-24 DIAGNOSIS — Z4801 Encounter for change or removal of surgical wound dressing: Secondary | ICD-10-CM | POA: Insufficient documentation

## 2011-07-24 DIAGNOSIS — I10 Essential (primary) hypertension: Secondary | ICD-10-CM | POA: Insufficient documentation

## 2011-07-24 DIAGNOSIS — M810 Age-related osteoporosis without current pathological fracture: Secondary | ICD-10-CM | POA: Insufficient documentation

## 2011-07-24 DIAGNOSIS — M129 Arthropathy, unspecified: Secondary | ICD-10-CM | POA: Insufficient documentation

## 2011-07-24 DIAGNOSIS — L0390 Cellulitis, unspecified: Secondary | ICD-10-CM

## 2011-07-24 LAB — DIFFERENTIAL
Basophils Absolute: 0 10*3/uL (ref 0.0–0.1)
Basophils Relative: 0 % (ref 0–1)
Eosinophils Absolute: 0 10*3/uL (ref 0.0–0.7)
Eosinophils Relative: 0 % (ref 0–5)
Lymphocytes Relative: 42 % (ref 12–46)
Lymphs Abs: 1.9 10*3/uL (ref 0.7–4.0)
Monocytes Absolute: 0.3 10*3/uL (ref 0.1–1.0)
Monocytes Relative: 6 % (ref 3–12)
Neutro Abs: 2.3 10*3/uL (ref 1.7–7.7)
Neutrophils Relative %: 51 % (ref 43–77)

## 2011-07-24 LAB — CBC
HCT: 38.2 % (ref 36.0–46.0)
Hemoglobin: 12.7 g/dL (ref 12.0–15.0)
MCH: 29.6 pg (ref 26.0–34.0)
MCHC: 33.2 g/dL (ref 30.0–36.0)
MCV: 89 fL (ref 78.0–100.0)
Platelets: 288 10*3/uL (ref 150–400)
RBC: 4.29 MIL/uL (ref 3.87–5.11)
RDW: 13.8 % (ref 11.5–15.5)
WBC: 4.5 10*3/uL (ref 4.0–10.5)

## 2011-07-24 LAB — SEDIMENTATION RATE: Sed Rate: 10 mm/hr (ref 0–22)

## 2011-07-24 MED ORDER — DOXYCYCLINE HYCLATE 100 MG PO TABS
100.0000 mg | ORAL_TABLET | Freq: Once | ORAL | Status: AC
  Administered 2011-07-24: 100 mg via ORAL
  Filled 2011-07-24: qty 1

## 2011-07-24 MED ORDER — DOXYCYCLINE HYCLATE 100 MG PO CAPS: 100.0000 mg | ORAL_CAPSULE | Freq: Two times a day (BID) | ORAL | Status: DC

## 2011-07-24 NOTE — ED Provider Notes (Signed)
History  This chart was scribed for Joya Gaskins, MD by Bennett Scrape. This patient was seen in room [facility] and the patient's care was started at 6:38PM.  CSN: [id number]  Arrival date & time 07/24/11  7829   First MD Initiated Contact with Patient 07/24/11 1838      Chief Complaint  Patient presents with  . Wound Check     Patient is a 78 y.o. female presenting with wound check. The history is provided by the patient. No language interpreter was used.  Wound Check  There has been no drainage from the wound. There is new redness present. The pain has no pain.    Katherine Lane is a 78 y.o. female who presents to the Emergency Department complaining of 4 hours of pain, redness and warmth along the shin of the right leg that pt noticed while visiting with her daughter. Pt reports that she had a hardware removal and nerve repair surgery on her right lower leg on 07/18/11 by Dr. Romeo Apple. She states that she had a follow up visit 4 days ago and was told that wound was healing well and she could start walking on the leg with a leg brace. She states that Dr. Romeo Apple is out of town for the next 4 days, so she called Dr. Hilda Lias, who is on-call for Dr. Romeo Apple, and was told to come to the ED to be evaluated for a possible skin infection. She denies new pain to wound or re-injuriy. She denies fevers, chest pain, abdominal pain and SOB as associated symptoms. She has a h/o HTN and arthritis. She denies smoking and alcohol use.  PCP is Dr. Gerda Diss.   Past Medical History  Diagnosis Date  . HTN (hypertension)   . Osteoporosis   . Constipation, chronic   . PONV (postoperative nausea and vomiting)   . Arthritis     Past Surgical History  Procedure Date  . Partial hysterectomy   . Ankle fracture surgery     2010-right  . Nerve repair 07/18/2011    Procedure: NERVE REPAIR;  Surgeon: Vickki Hearing, MD;  Location: [facility];  Service: Orthopedics;  Laterality: Right;  Right leg  superficial peroneal nerve release    . Hardware removal 07/18/2011    Procedure: HARDWARE REMOVAL;  Surgeon: Vickki Hearing, MD;  Location: [facility];  Service: Orthopedics;  Laterality: Right;    Family History  Problem Relation Age of Onset  . Alzheimer's disease    . Cancer    . Anesthesia problems Neg Hx   . Hypotension Neg Hx   . Malignant hyperthermia Neg Hx   . Pseudochol deficiency Neg Hx     History  Substance Use Topics  . Smoking status: Never Smoker   . Smokeless tobacco: Not on file  . Alcohol Use: No     Review of Systems  Constitutional: Negative for fever.  Respiratory: Negative for cough and shortness of breath.   Cardiovascular: Negative for chest pain.  Gastrointestinal: Negative for abdominal pain.    Allergies  Codeine  Home Medications   Current Outpatient Rx  Name Route Sig Dispense Refill  . ASPIRIN EC 81 MG PO TBEC Oral Take 81 mg by mouth daily.    Marland Kitchen CALCIUM 500 + D PO Oral Take 1 tablet by mouth daily.    . ENALAPRIL MALEATE 20 MG PO TABS Oral Take 20 mg by mouth every evening.     Marland Kitchen HYDROCHLOROTHIAZIDE 25 MG PO TABS Oral Take  1 tablet by mouth as needed. Uses very infrequently when BP gets really high    . HYDROMORPHONE HCL 4 MG PO TABS Oral Take 1 tablet (4 mg total) by mouth every 12 (twelve) hours as needed for pain. 30 tablet 0  . IBUPROFEN 800 MG PO TABS Oral Take 1 tablet (800 mg total) by mouth every 8 (eight) hours as needed for pain. 60 tablet 1  . METOPROLOL SUCCINATE ER 25 MG PO TB24 Oral Take 25 mg by mouth daily.      Marland Kitchen PROMETHAZINE HCL 12.5 MG PO TABS Oral Take 1 tablet (12.5 mg total) by mouth every 6 (six) hours as needed for nausea. 30 tablet 0  . VERAPAMIL HCL 240 MG PO TBCR Oral Take 240 mg by mouth daily.     Marland Kitchen ZOLPIDEM TARTRATE 10 MG PO TABS Oral Take 10 mg by mouth at bedtime.       Triage Vitals: BP 185/85  Pulse 67  Temp(Src) 98.1 F (36.7 C) (Oral)  Resp 20  SpO2 97%  Physical Exam  Nursing note and vitals  reviewed.  CONSTITUTIONAL: Well developed/well nourished HEAD AND FACE: Normocephalic/atraumatic EYES: EOMI/PERRL ENMT: Mucous membranes moist NECK: supple no meningeal signs SPINE:entire spine nontender CV: S1/S2 noted, no murmurs/rubs/gallops noted LUNGS: Lungs are clear to auscultation bilaterally, no apparent distress ABDOMEN: soft, nontender, no rebound or guarding GU:no cva tenderness NEURO: Pt is awake/alert, moves all extremitiesx4 EXTREMITIES: pulses normal, full ROM SKIN: warm, color normal, well healed incision along the outer right calf, erythema on the tibial surface of the right leg without crepitance and without calf tenderness.  Distal pulses intact and no discoloration of the right foot PSYCH: no abnormalities of mood noted  ED Course  Procedures   DIAGNOSTIC STUDIES: Oxygen Saturation is 97% on room air, adequate by my interpretation.    COORDINATION OF CARE: 6:52PM-Discussed treatment plan of blood work and consultation with Dr. Hilda Lias with pt and pt agreed to plan. Pt turned down pain medications. 8:33PM-Discussed discharge plan which includes antibiotics and following up with Dr. Hilda Lias tomorrow morning with pt and pt agreed.  Dr Hilda Lias wants me to start doxycycline and will see in AM My suspicion for DVT is low.  No signs of compartment syndrome.  Calf is soft/nontender Wound appears well, no drainage, no erythema Does have some localized bruising though     MDM  Nursing notes including past medical history, social history and family history reviewed and considered in documentation Previous records reviewed and considered - recent nerve release/harware removal by dr Romeo Apple labs/vitals reviewed and considered       I personally performed the services described in this documentation, which was scribed in my presence. The recorded information has been reviewed and considered.      Joya Gaskins, MD 07/24/11 2048

## 2011-07-24 NOTE — Discharge Instructions (Signed)
Cellulitis Cellulitis is an infection of the tissue under the skin. The infected area is usually red and tender. This is caused by germs. These germs enter the body through cuts or sores. This usually happens in the arms or lower legs. HOME CARE   Take your medicine as told. Finish it even if you start to feel better.   If the infection is on the arm or leg, keep it raised (elevated).   Use a warm cloth on the infected area several times a day.   See your doctor for a follow-up visit as told.  GET HELP RIGHT AWAY IF:   You are tired or confused.   You throw up (vomit).   You have watery poop (diarrhea).   You feel ill and have muscle aches.   You have a fever.  MAKE SURE YOU:   Understand these instructions.   Will watch your condition.   Will get help right away if you are not doing well or get worse.  Document Released: 07/23/2007 Document Revised: 01/23/2011 Document Reviewed: 01/05/2009 [employer] Patient Information 2012 [employer]. 

## 2011-07-24 NOTE — ED Notes (Signed)
S/p surgery on right lower leg, states she noted red spots on leg this afternoon, concerned it may be infected

## 2011-07-24 NOTE — Telephone Encounter (Signed)
Jiayi Otte's husband, Adela Lank, called this afternoon asking for an appointment for [facility] .  He said her incision is red,and very warm and painful. Advised him to take her to the ER as you will not be in the office until Monday afternoon.  Adela Lank said he did not want to take her to the ER, he may take her his doctor tomorrow. Strongly encouraged him to take the patient to the  ER today.  Told him we would certainly bring her into the office if you would be here,but she needed to be seen before Monday.

## 2011-07-24 NOTE — ED Notes (Signed)
Redness to right shin, hot to touch. Incision to right distal, lateral leg. Staples intact, well approximated wound. No drainage noted and patient states wound does not hurt.

## 2011-07-25 NOTE — Telephone Encounter (Signed)
I spoke with Katherine Lane this morning, she did go to the ER and was given antibiotics and said she is much better this morning.  I scheduled her to see you Monday afternoon.

## 2011-07-28 ENCOUNTER — Ambulatory Visit (INDEPENDENT_AMBULATORY_CARE_PROVIDER_SITE_OTHER): Payer: Medicare Other | Admitting: Orthopedic Surgery

## 2011-07-28 VITALS — Ht 62.5 in | Wt 139.0 lb

## 2011-07-28 DIAGNOSIS — L0390 Cellulitis, unspecified: Secondary | ICD-10-CM

## 2011-07-28 DIAGNOSIS — L0291 Cutaneous abscess, unspecified: Secondary | ICD-10-CM

## 2011-07-28 MED ORDER — DOXYCYCLINE HYCLATE 100 MG PO CAPS: 100.0000 mg | ORAL_CAPSULE | Freq: Two times a day (BID) | ORAL | Status: AC

## 2011-07-28 NOTE — Progress Notes (Signed)
Patient ID: Katherine Lane, female   DOB: [birth year], 78 y.o.   MRN: [id number] Chief Complaint  Patient presents with  . Follow-up    Recheck.   post op 1 superficial peroneal nerve release, DOS 07/18/11   The patient developed redness over the anterior tibial crest and increased pain went to the emergency room, which resulted in her being placed on doxycycline. Her pain and redness have subsided except for small strip of redness over the anterior crest of the tibia, which is much less than before. She is to continue doxycycline, Aircast. Decrease her activities and return on Wednesday to have the staples taken out

## 2011-07-28 NOTE — Patient Instructions (Signed)
Air cast   Continue antibiotic

## 2011-07-30 ENCOUNTER — Ambulatory Visit (INDEPENDENT_AMBULATORY_CARE_PROVIDER_SITE_OTHER): Payer: Medicare Other | Admitting: Orthopedic Surgery

## 2011-07-30 ENCOUNTER — Encounter: Payer: Self-pay | Admitting: Orthopedic Surgery

## 2011-07-30 VITALS — BP 130/80 | Ht 62.5 in | Wt 139.0 lb

## 2011-07-30 DIAGNOSIS — T84498A Other mechanical complication of other internal orthopedic devices, implants and grafts, initial encounter: Secondary | ICD-10-CM

## 2011-07-30 DIAGNOSIS — G5760 Lesion of plantar nerve, unspecified lower limb: Secondary | ICD-10-CM

## 2011-07-30 DIAGNOSIS — S82899A Other fracture of unspecified lower leg, initial encounter for closed fracture: Secondary | ICD-10-CM

## 2011-07-30 DIAGNOSIS — G5730 Lesion of lateral popliteal nerve, unspecified lower limb: Secondary | ICD-10-CM

## 2011-07-30 NOTE — Patient Instructions (Addendum)
Continue antibiotics

## 2011-07-30 NOTE — Progress Notes (Signed)
Patient ID: Katherine Lane, female   DOB: Oct 31, 1932, 78 y.o.   MRN: [id number] Chief Complaint  Patient presents with  . Routine Post Op    post op 2 remove staples, DOS 07/18/11 NERVE REPAIR    BP 130/80  Ht 5' 2.5" (1.588 m)  Wt 63.05 kg (139 lb)  BMI 25.02 kg/m2  Staples removed today. Wound is clean. The area. Cellulitis is improving. There is some tenderness over the anterior tibial crest with some swelling and subcutaneous bruising.  Recommendation continue bracing, antibiotics, and come back in a week. for wound check and then we should be able to take the brace off.

## 2011-07-31 ENCOUNTER — Encounter: Payer: Self-pay | Admitting: Orthopedic Surgery

## 2011-07-31 ENCOUNTER — Ambulatory Visit (INDEPENDENT_AMBULATORY_CARE_PROVIDER_SITE_OTHER): Payer: Medicare Other | Admitting: Orthopedic Surgery

## 2011-07-31 VITALS — BP 140/80 | Ht 62.5 in | Wt 139.0 lb

## 2011-07-31 DIAGNOSIS — L0390 Cellulitis, unspecified: Secondary | ICD-10-CM | POA: Insufficient documentation

## 2011-07-31 DIAGNOSIS — L0291 Cutaneous abscess, unspecified: Secondary | ICD-10-CM

## 2011-07-31 LAB — CBC WITH DIFFERENTIAL/PLATELET
Basophils Absolute: 0 10*3/uL (ref 0.0–0.1)
Basophils Relative: 0 % (ref 0–1)
Eosinophils Absolute: 0 10*3/uL (ref 0.0–0.7)
Eosinophils Relative: 0 % (ref 0–5)
HCT: 44.3 % (ref 36.0–46.0)
Hemoglobin: 14.7 g/dL (ref 12.0–15.0)
Lymphocytes Relative: 39 % (ref 12–46)
Lymphs Abs: 2.2 10*3/uL (ref 0.7–4.0)
MCH: 30 pg (ref 26.0–34.0)
MCHC: 33.2 g/dL (ref 30.0–36.0)
MCV: 90.4 fL (ref 78.0–100.0)
Monocytes Absolute: 0.3 10*3/uL (ref 0.1–1.0)
Monocytes Relative: 6 % (ref 3–12)
Neutro Abs: 3 10*3/uL (ref 1.7–7.7)
Neutrophils Relative %: 55 % (ref 43–77)
Platelets: 344 10*3/uL (ref 150–400)
RBC: 4.9 MIL/uL (ref 3.87–5.11)
RDW: 13.8 % (ref 11.5–15.5)
WBC: 5.5 10*3/uL (ref 4.0–10.5)

## 2011-07-31 LAB — BASIC METABOLIC PANEL
BUN: 16 mg/dL (ref 6–23)
CO2: 28 mEq/L (ref 19–32)
Calcium: 10.2 mg/dL (ref 8.4–10.5)
Chloride: 104 mEq/L (ref 96–112)
Creat: 0.87 mg/dL (ref 0.50–1.10)
Glucose, Bld: 103 mg/dL — ABNORMAL HIGH (ref 70–99)
Potassium: 4.3 mEq/L (ref 3.5–5.3)
Sodium: 143 mEq/L (ref 135–145)

## 2011-07-31 LAB — C-REACTIVE PROTEIN: CRP: 0.16 mg/dL (ref <0.60)

## 2011-07-31 LAB — SEDIMENTATION RATE: Sed Rate: 6 mm/hr (ref 0–22)

## 2011-07-31 NOTE — Addendum Note (Signed)
Addended by: Adella Hare B on: 07/31/2011 11:30 AM   Modules accepted: Orders

## 2011-07-31 NOTE — Progress Notes (Signed)
Patient ID: Katherine Lane, female   DOB: Apr 26, 1932, 78 y.o.   MRN: [id number] Chief Complaint  Patient presents with  . Follow-up    Recheck RIGHT leg, increased pain and redness and swelling   BP 140/80  Ht 5' 2.5" (1.588 m)  Wt 139 lb (63.05 kg)  BMI 25.02 kg/m2  Status post hardware removal, RIGHT lower extremity with transfer and release of superficial peroneal nerve. Patient developed postoperative redness around the anterior tibia away from the surgical site. She was treated with p.o. Antibiotics. Did not improve and presents back with increased pain, redness and swelling along the anterior tibia.  The surgical incision appears to be clean and remains without drainage.  Patient will be started on IV vancomycin for 10 days and follow up arrange for next week.

## 2011-07-31 NOTE — Patient Instructions (Addendum)
[facility] IV vancomycin: x 10 days

## 2011-08-01 ENCOUNTER — Encounter (HOSPITAL_COMMUNITY): Payer: Medicare Other | Attending: Orthopedic Surgery

## 2011-08-01 ENCOUNTER — Telehealth: Payer: Self-pay | Admitting: Orthopedic Surgery

## 2011-08-01 ENCOUNTER — Ambulatory Visit (HOSPITAL_COMMUNITY): Payer: Medicare Other

## 2011-08-01 DIAGNOSIS — L0291 Cutaneous abscess, unspecified: Secondary | ICD-10-CM | POA: Insufficient documentation

## 2011-08-01 DIAGNOSIS — L0390 Cellulitis, unspecified: Secondary | ICD-10-CM

## 2011-08-01 MED ORDER — VANCOMYCIN HCL IN DEXTROSE 1-5 GM/200ML-% IV SOLN
1000.0000 mg | Freq: Once | INTRAVENOUS | Status: AC
  Administered 2011-08-01: 1000 mg via INTRAVENOUS
  Filled 2011-08-01 (×3): qty 200

## 2011-08-01 MED ORDER — SODIUM CHLORIDE 0.9 % IV SOLN
INTRAVENOUS | Status: DC
  Administered 2011-08-01: 12:00:00 via INTRAVENOUS

## 2011-08-01 NOTE — Progress Notes (Signed)
Infusion complete, patient tolerated well.  IV wrapped with guaze for subsequent infusions.   

## 2011-08-01 NOTE — Telephone Encounter (Signed)
Per [place] at [facility], ph# [phone], patient is scheduled for initial appointment today, 08/01/11, 11:45am, for anti-biotic(Vancomycin) treatment.

## 2011-08-02 ENCOUNTER — Encounter (HOSPITAL_COMMUNITY): Payer: Medicare Other

## 2011-08-02 VITALS — BP 150/66 | HR 55 | Temp 97.5°F

## 2011-08-02 DIAGNOSIS — L0390 Cellulitis, unspecified: Secondary | ICD-10-CM

## 2011-08-02 MED ORDER — SODIUM CHLORIDE 0.9 % IJ SOLN
10.0000 mL | INTRAMUSCULAR | Status: DC | PRN
  Administered 2011-08-02: 10 mL via INTRAVENOUS

## 2011-08-02 MED ORDER — SODIUM CHLORIDE 0.9 % IV SOLN
INTRAVENOUS | Status: DC
  Administered 2011-08-02: 10:00:00 via INTRAVENOUS

## 2011-08-02 MED ORDER — VANCOMYCIN HCL IN DEXTROSE 1-5 GM/200ML-% IV SOLN
1000.0000 mg | Freq: Once | INTRAVENOUS | Status: AC
  Administered 2011-08-02: 1000 mg via INTRAVENOUS

## 2011-08-02 NOTE — Progress Notes (Signed)
Infusion complete, patient tolerated well.  IV wrapped with guaze for further infusions.

## 2011-08-03 ENCOUNTER — Encounter (HOSPITAL_COMMUNITY): Payer: Medicare Other

## 2011-08-03 VITALS — BP 149/60 | HR 53 | Temp 97.9°F

## 2011-08-03 DIAGNOSIS — L0390 Cellulitis, unspecified: Secondary | ICD-10-CM

## 2011-08-03 MED ORDER — SODIUM CHLORIDE 0.9 % IJ SOLN
10.0000 mL | INTRAMUSCULAR | Status: DC | PRN
Start: 2011-08-03 — End: 2011-08-03
  Administered 2011-08-03: 10 mL via INTRAVENOUS

## 2011-08-03 MED ORDER — VANCOMYCIN HCL IN DEXTROSE 1-5 GM/200ML-% IV SOLN
1000.0000 mg | Freq: Once | INTRAVENOUS | Status: AC
  Administered 2011-08-03: 1000 mg via INTRAVENOUS

## 2011-08-03 MED ORDER — SODIUM CHLORIDE 0.9 % IV SOLN
INTRAVENOUS | Status: DC
  Administered 2011-08-03: 09:00:00 via INTRAVENOUS

## 2011-08-03 NOTE — Progress Notes (Signed)
Infusion complete, patient tolerated well.   

## 2011-08-04 ENCOUNTER — Encounter (HOSPITAL_COMMUNITY): Payer: Medicare Other

## 2011-08-04 DIAGNOSIS — L0390 Cellulitis, unspecified: Secondary | ICD-10-CM

## 2011-08-04 LAB — VANCOMYCIN, PEAK: Vancomycin Pk: 65 ug/mL (ref 20–40)

## 2011-08-04 LAB — VANCOMYCIN, TROUGH: Vancomycin Tr: 7.7 ug/mL — ABNORMAL LOW (ref 10.0–20.0)

## 2011-08-04 MED ORDER — VANCOMYCIN HCL IN DEXTROSE 1-5 GM/200ML-% IV SOLN
1000.0000 mg | Freq: Once | INTRAVENOUS | Status: AC
  Administered 2011-08-04: 1000 mg via INTRAVENOUS
  Filled 2011-08-04: qty 200

## 2011-08-04 MED ORDER — SODIUM CHLORIDE 0.9 % IJ SOLN
10.0000 mL | INTRAMUSCULAR | Status: DC | PRN
  Administered 2011-08-04: 10 mL via INTRAVENOUS

## 2011-08-04 MED ORDER — SODIUM CHLORIDE 0.9 % IV SOLN
INTRAVENOUS | Status: DC
  Administered 2011-08-04: 11:00:00 via INTRAVENOUS

## 2011-08-04 NOTE — Progress Notes (Signed)
  Tolerated infusion well.  Lab faxed to Dr. Romeo Apple.

## 2011-08-05 ENCOUNTER — Encounter (HOSPITAL_COMMUNITY): Payer: Medicare Other

## 2011-08-05 VITALS — BP 148/72 | HR 56 | Temp 98.0°F

## 2011-08-05 DIAGNOSIS — L0390 Cellulitis, unspecified: Secondary | ICD-10-CM

## 2011-08-05 MED ORDER — VANCOMYCIN HCL IN DEXTROSE 1-5 GM/200ML-% IV SOLN
1000.0000 mg | Freq: Once | INTRAVENOUS | Status: AC
  Administered 2011-08-05: 1000 mg via INTRAVENOUS
  Filled 2011-08-05: qty 200

## 2011-08-05 MED ORDER — SODIUM CHLORIDE 0.9 % IV SOLN
Freq: Once | INTRAVENOUS | Status: AC
  Administered 2011-08-05: 12:00:00 via INTRAVENOUS

## 2011-08-05 MED ORDER — SODIUM CHLORIDE 0.9 % IJ SOLN
10.0000 mL | Freq: Once | INTRAMUSCULAR | Status: AC
  Administered 2011-08-05: 10 mL via INTRAVENOUS

## 2011-08-05 NOTE — Progress Notes (Signed)
Tolerated well

## 2011-08-06 ENCOUNTER — Encounter: Payer: Self-pay | Admitting: Orthopedic Surgery

## 2011-08-06 ENCOUNTER — Encounter (HOSPITAL_COMMUNITY): Payer: Medicare Other

## 2011-08-06 ENCOUNTER — Ambulatory Visit (INDEPENDENT_AMBULATORY_CARE_PROVIDER_SITE_OTHER): Payer: Medicare Other | Admitting: Orthopedic Surgery

## 2011-08-06 VITALS — BP 146/90 | Ht 62.5 in | Wt 139.0 lb

## 2011-08-06 VITALS — BP 161/82 | HR 77 | Temp 97.2°F

## 2011-08-06 DIAGNOSIS — G5760 Lesion of plantar nerve, unspecified lower limb: Secondary | ICD-10-CM

## 2011-08-06 DIAGNOSIS — L0390 Cellulitis, unspecified: Secondary | ICD-10-CM

## 2011-08-06 DIAGNOSIS — L0291 Cutaneous abscess, unspecified: Secondary | ICD-10-CM

## 2011-08-06 LAB — VANCOMYCIN, PEAK: Vancomycin Pk: 33.2 ug/mL (ref 20–40)

## 2011-08-06 LAB — VANCOMYCIN, TROUGH: Vancomycin Tr: 8.4 ug/mL — ABNORMAL LOW (ref 10.0–20.0)

## 2011-08-06 MED ORDER — SODIUM CHLORIDE 0.9 % IV SOLN
INTRAVENOUS | Status: DC
  Administered 2011-08-06: 11:00:00 via INTRAVENOUS

## 2011-08-06 MED ORDER — VANCOMYCIN HCL IN DEXTROSE 1-5 GM/200ML-% IV SOLN
1000.0000 mg | Freq: Once | INTRAVENOUS | Status: AC
  Administered 2011-08-06: 1000 mg via INTRAVENOUS
  Filled 2011-08-06: qty 200

## 2011-08-06 MED ORDER — SODIUM CHLORIDE 0.9 % IJ SOLN
10.0000 mL | INTRAMUSCULAR | Status: DC | PRN
  Administered 2011-08-06: 10 mL via INTRAVENOUS

## 2011-08-06 NOTE — Patient Instructions (Signed)
Brace off 

## 2011-08-06 NOTE — Progress Notes (Signed)
Tolerated vancomycin infusion well. 

## 2011-08-06 NOTE — Progress Notes (Signed)
Patient ID: Katherine Lane, female   DOB: Apr 21, 1932, 78 y.o.   MRN: [id number] Chief Complaint  Patient presents with  . Wound Check    one week recheck wound, DOS 07/18/11   BP 146/90  Ht 5' 2.5" (1.588 m)  Wt 139 lb (63.05 kg)  BMI 25.02 kg/m2  I V Vancomycin (will stop Sun)   Looks very good   Decreased pain and swelling over the tibia\\  Return Tuesday

## 2011-08-07 ENCOUNTER — Encounter (HOSPITAL_COMMUNITY): Payer: Medicare Other

## 2011-08-07 DIAGNOSIS — L0390 Cellulitis, unspecified: Secondary | ICD-10-CM

## 2011-08-07 MED ORDER — VANCOMYCIN HCL IN DEXTROSE 1-5 GM/200ML-% IV SOLN
1000.0000 mg | Freq: Once | INTRAVENOUS | Status: AC
  Administered 2011-08-07: 1000 mg via INTRAVENOUS
  Filled 2011-08-07: qty 200

## 2011-08-07 MED ORDER — SODIUM CHLORIDE 0.9 % IJ SOLN
10.0000 mL | Freq: Once | INTRAMUSCULAR | Status: AC
  Administered 2011-08-07: 10 mL via INTRAVENOUS

## 2011-08-07 MED ORDER — SODIUM CHLORIDE 0.9 % IV SOLN
Freq: Once | INTRAVENOUS | Status: AC
  Administered 2011-08-07: 11:00:00 via INTRAVENOUS

## 2011-08-07 MED ORDER — HEPARIN SOD (PORK) LOCK FLUSH 100 UNIT/ML IV SOLN
250.0000 [IU] | Freq: Once | INTRAVENOUS | Status: AC
  Administered 2011-08-07: 250 [IU] via INTRAVENOUS

## 2011-08-07 NOTE — Progress Notes (Unsigned)
Tolerated well

## 2011-08-08 ENCOUNTER — Encounter (HOSPITAL_COMMUNITY): Payer: Medicare Other

## 2011-08-08 VITALS — BP 132/65 | HR 65 | Temp 98.4°F

## 2011-08-08 DIAGNOSIS — G5760 Lesion of plantar nerve, unspecified lower limb: Secondary | ICD-10-CM

## 2011-08-08 MED ORDER — SODIUM CHLORIDE 0.9 % IJ SOLN
10.0000 mL | INTRAMUSCULAR | Status: DC | PRN
  Administered 2011-08-08: 10 mL via INTRAVENOUS

## 2011-08-08 MED ORDER — SODIUM CHLORIDE 0.9 % IV SOLN
INTRAVENOUS | Status: DC
  Administered 2011-08-08: 11:00:00 via INTRAVENOUS

## 2011-08-08 MED ORDER — VANCOMYCIN HCL IN DEXTROSE 1-5 GM/200ML-% IV SOLN
1000.0000 mg | Freq: Once | INTRAVENOUS | Status: AC
  Administered 2011-08-08: 1000 mg via INTRAVENOUS
  Filled 2011-08-08 (×3): qty 200

## 2011-08-08 NOTE — Progress Notes (Signed)
IV site wnl. IV site wrapped with gauze. Pt tolerated IV infusion without difficulty.

## 2011-08-09 ENCOUNTER — Encounter (HOSPITAL_COMMUNITY): Payer: Medicare Other

## 2011-08-09 VITALS — BP 144/62 | HR 62 | Temp 97.8°F

## 2011-08-09 DIAGNOSIS — L0390 Cellulitis, unspecified: Secondary | ICD-10-CM

## 2011-08-09 LAB — VANCOMYCIN, PEAK: Vancomycin Pk: 32.6 ug/mL (ref 20–40)

## 2011-08-09 MED ORDER — SODIUM CHLORIDE 0.9 % IV SOLN
INTRAVENOUS | Status: DC
  Administered 2011-08-09: 250 mL via INTRAVENOUS

## 2011-08-09 MED ORDER — VANCOMYCIN HCL IN DEXTROSE 1-5 GM/200ML-% IV SOLN
1000.0000 mg | Freq: Once | INTRAVENOUS | Status: AC
  Administered 2011-08-09: 1000 mg via INTRAVENOUS

## 2011-08-09 NOTE — Progress Notes (Signed)
08/09/2011 0926 [name]  reported pain to her right arm at IV insertion site nearing the end of the vancomycin infusion - infusion was stopped and site assessed.  No obvious reason for pain noted; however, PIV was d/c'd and restarted due to pain complaint.  Ice given for patient to place on her right arm.  08/09/2011 11:17 AM Patient tolerated infusion well, verbalized understanding and discharged without incident.  Labs drawn from PIV, NSL flushed with 20cc NS.

## 2011-08-10 ENCOUNTER — Encounter (HOSPITAL_COMMUNITY): Payer: Medicare Other

## 2011-08-10 VITALS — BP 150/73 | HR 72 | Temp 98.2°F

## 2011-08-10 DIAGNOSIS — L0390 Cellulitis, unspecified: Secondary | ICD-10-CM

## 2011-08-10 MED ORDER — VANCOMYCIN HCL IN DEXTROSE 1-5 GM/200ML-% IV SOLN
1000.0000 mg | Freq: Once | INTRAVENOUS | Status: AC
  Administered 2011-08-10: 1000 mg via INTRAVENOUS

## 2011-08-10 MED ORDER — SODIUM CHLORIDE 0.9 % IV SOLN
INTRAVENOUS | Status: DC
  Administered 2011-08-10: 250 mL via INTRAVENOUS

## 2011-08-10 NOTE — Progress Notes (Signed)
Katherine Lane tolerated infusion well and without incident; verbalizes understanding for follow-up.  No distress noted at time of discharge and patient was discharged home with her grand-daughter.  Patient also reports that the site on her right arm where she had pain yesterday is not causing pain today; right forearm remains unremarkable of s/s of infection.

## 2011-08-12 ENCOUNTER — Ambulatory Visit (INDEPENDENT_AMBULATORY_CARE_PROVIDER_SITE_OTHER): Payer: Medicare Other | Admitting: Orthopedic Surgery

## 2011-08-12 ENCOUNTER — Encounter: Payer: Self-pay | Admitting: Orthopedic Surgery

## 2011-08-12 VITALS — BP 120/80 | Ht 62.5 in | Wt 139.0 lb

## 2011-08-12 DIAGNOSIS — L0291 Cutaneous abscess, unspecified: Secondary | ICD-10-CM

## 2011-08-12 DIAGNOSIS — T84498A Other mechanical complication of other internal orthopedic devices, implants and grafts, initial encounter: Secondary | ICD-10-CM

## 2011-08-12 DIAGNOSIS — G5730 Lesion of lateral popliteal nerve, unspecified lower limb: Secondary | ICD-10-CM

## 2011-08-12 DIAGNOSIS — L0390 Cellulitis, unspecified: Secondary | ICD-10-CM

## 2011-08-12 NOTE — Progress Notes (Signed)
Patient ID: Katherine Lane, female   DOB: 12-Jan-1933, 78 y.o.   MRN: [id number] Chief Complaint  Patient presents with  . Follow-up    recheck right ankle cellulitis, DOS 07/18/11    BP 120/80  Ht 5' 2.5" (1.588 m)  Wt 139 lb (63.05 kg)  BMI 25.02 kg/m2  The patient has noticed improvement after antibiotics, IV. She has not had any real symptoms after being off antibiotics for 2 days. Her wound looks good. Her swelling and redness have resolved. She does have some dependent edema at the end of the day, which resolves with elevation, overnight.  Follow up as needed

## 2012-06-01 ENCOUNTER — Telehealth: Payer: Self-pay | Admitting: Family Medicine

## 2012-06-01 NOTE — Telephone Encounter (Signed)
wis ok herlus one refillp

## 2012-06-01 NOTE — Telephone Encounter (Signed)
Pt needs refill on her ambien 10 mg  x90 day supply, she has only one pill left for today [facility]

## 2012-06-01 NOTE — Telephone Encounter (Signed)
ambien 10 mg # 90 with 1 refill per dr Brett Canales called into [facility]. Pt notified on her voicemail

## 2012-06-01 NOTE — Telephone Encounter (Signed)
Pt had last chronic visit 02/04/12

## 2012-07-05 ENCOUNTER — Encounter: Payer: Self-pay | Admitting: *Deleted

## 2012-07-06 ENCOUNTER — Ambulatory Visit (INDEPENDENT_AMBULATORY_CARE_PROVIDER_SITE_OTHER): Payer: Medicare Other | Admitting: Nurse Practitioner

## 2012-07-06 ENCOUNTER — Encounter: Payer: Self-pay | Admitting: Nurse Practitioner

## 2012-07-06 VITALS — BP 130/84 | HR 80 | Ht 62.0 in | Wt 133.2 lb

## 2012-07-06 DIAGNOSIS — Z0000 Encounter for general adult medical examination without abnormal findings: Secondary | ICD-10-CM

## 2012-07-06 DIAGNOSIS — M810 Age-related osteoporosis without current pathological fracture: Secondary | ICD-10-CM

## 2012-07-06 DIAGNOSIS — R5383 Other fatigue: Secondary | ICD-10-CM

## 2012-07-06 DIAGNOSIS — I10 Essential (primary) hypertension: Secondary | ICD-10-CM

## 2012-07-06 DIAGNOSIS — Z13220 Encounter for screening for lipoid disorders: Secondary | ICD-10-CM

## 2012-07-06 DIAGNOSIS — Z01419 Encounter for gynecological examination (general) (routine) without abnormal findings: Secondary | ICD-10-CM

## 2012-07-06 DIAGNOSIS — R5381 Other malaise: Secondary | ICD-10-CM

## 2012-07-06 NOTE — Progress Notes (Signed)
Prescription for zostavax given to patient as per ordered. Mammogram scheduled for 07/15/12 at 10:30 am at [facility]. Patient notified.

## 2012-07-09 ENCOUNTER — Encounter (HOSPITAL_COMMUNITY): Payer: Self-pay | Admitting: Pharmacy Technician

## 2012-07-09 ENCOUNTER — Encounter: Payer: Self-pay | Admitting: Nurse Practitioner

## 2012-07-09 NOTE — Progress Notes (Signed)
Subjective:  Presents for her wellness checkup. BP outside the office is running high, usually when she is upset. Brief periods of chest heaviness usually when she is upset. No chest pain/ischemic type pain with activity. Some fatigue. Lost her husband about a month ago. Minimal shortness of breath. Regular eye exams. Is having cataract surgery in the near future. Regular dental care. No pelvic pain. No vaginal discharge. Bowels normal limit. No urinary symptoms.  Objective:   BP 130/84  Pulse 80  Ht 5\' 2"  (1.575 m)  Wt 133 lb 4 oz (60.442 kg)  BMI 24.37 kg/m2 NAD. Alert, oriented. TMs normal limit. Pharynx clear. Neck supple with minimal adenopathy. Thyroid normal to palpation and nontender. Lungs clear. Heart regular rate rhythm. ECG normal limit. No murmur or gallop noted. BP at our office today and it recent visits within normal limit. Breast exam no masses noted. Axilla no adenopathy. Abdomen soft nondistended nontender. EGBUS normal limit with signs of hypoestrogenism. Bimanual exam normal limit, ovaries nonpalpable. No tenderness.  Assessment:Well woman exam - Plan: MM Digital Screening  Osteoporosis, unspecified - Plan: Vitamin D 25 hydroxy, Vitamin D 25 hydroxy  Hypertension - Plan: Basic metabolic panel, Lipid panel, Basic metabolic panel, Lipid panel  Fatigue - Plan: Hepatic function panel, Vitamin D 25 hydroxy, Hepatic function panel, Vitamin D 25 hydroxy  Need for lipid screening - Plan: Lipid panel, Lipid panel  Bereavement Plan: Continue calcium and vitamin D as directed. Patient defers counseling or medication for her depression at this point. To call back our office if she needs any assistance. States she's dealing with her grief very well, has excellent family support. Recommend daily walking program or similar activity. Recheck in 6 months, sooner if any problems.

## 2012-07-09 NOTE — Patient Instructions (Addendum)
Your procedure is scheduled on: 07/22/2012   Report to [facility] at  620    AM.  Call this number if you have problems the morning of surgery: [phone]   Do not eat food or drink liquids :After Midnight.      Take these medicines the morning of surgery with A SIP OF WATER: vasotec,ativan,toprol, calan   Do not wear jewelry, make-up or nail polish.  Do not wear lotions, powders, or perfumes.   Do not shave 48 hours prior to surgery.  Do not bring valuables to the hospital.  Contacts, dentures or bridgework may not be worn into surgery.  Leave suitcase in the car. After surgery it may be brought to your room.  For patients admitted to the hospital, checkout time is 11:00 AM the day of discharge.   Patients discharged the day of surgery will not be allowed to drive home.  :     Please read over the following fact sheets that you were given: Coughing and Deep Breathing, Surgical Site Infection Prevention, Anesthesia Post-op Instructions and Care and Recovery After Surgery    Cataract A cataract is a clouding of the lens of the eye. When a lens becomes cloudy, vision is reduced based on the degree and nature of the clouding. Many cataracts reduce vision to some degree. Some cataracts make people more near-sighted as they develop. Other cataracts increase glare. Cataracts that are ignored and become worse can sometimes look white. The white color can be seen through the pupil. CAUSES   Aging. However, cataracts may occur at any age, even in newborns.   Certain drugs.   Trauma to the eye.   Certain diseases such as diabetes.   Specific eye diseases such as chronic inflammation inside the eye or a sudden attack of a rare form of glaucoma.   Inherited or acquired medical problems.  SYMPTOMS   Gradual, progressive drop in vision in the affected eye.   Severe, rapid visual loss. This most often happens when trauma is the cause.  DIAGNOSIS  To detect a cataract, an eye doctor examines  the lens. Cataracts are best diagnosed with an exam of the eyes with the pupils enlarged (dilated) by drops.  TREATMENT  For an early cataract, vision may improve by using different eyeglasses or stronger lighting. If that does not help your vision, surgery is the only effective treatment. A cataract needs to be surgically removed when vision loss interferes with your everyday activities, such as driving, reading, or watching TV. A cataract may also have to be removed if it prevents examination or treatment of another eye problem. Surgery removes the cloudy lens and usually replaces it with a substitute lens (intraocular lens, IOL).  At a time when both you and your doctor agree, the cataract will be surgically removed. If you have cataracts in both eyes, only one is usually removed at a time. This allows the operated eye to heal and be out of danger from any possible problems after surgery (such as infection or poor wound healing). In rare cases, a cataract may be doing damage to your eye. In these cases, your caregiver may advise surgical removal right away. The vast majority of people who have cataract surgery have better vision afterward. HOME CARE INSTRUCTIONS  If you are not planning surgery, you may be asked to do the following:  Use different eyeglasses.   Use stronger or brighter lighting.   Ask your eye doctor about reducing your medicine dose  or changing medicines if it is thought that a medicine caused your cataract. Changing medicines does not make the cataract go away on its own.   Become familiar with your surroundings. Poor vision can lead to injury. Avoid bumping into things on the affected side. You are at a higher risk for tripping or falling.   Exercise extreme care when driving or operating machinery.   Wear sunglasses if you are sensitive to bright light or experiencing problems with glare.  SEEK IMMEDIATE MEDICAL CARE IF:   You have a worsening or sudden vision loss.    You notice redness, swelling, or increasing pain in the eye.   You have a fever.  Document Released: 02/03/2005 Document Revised: 01/23/2011 Document Reviewed: 09/27/2010 [facility] Patient Information 2012 [place].PATIENT INSTRUCTIONS POST-ANESTHESIA  IMMEDIATELY FOLLOWING SURGERY:  Do not drive or operate machinery for the first twenty four hours after surgery.  Do not make any important decisions for twenty four hours after surgery or while taking narcotic pain medications or sedatives.  If you develop intractable nausea and vomiting or a severe headache please notify your doctor immediately.  FOLLOW-UP:  Please make an appointment with your surgeon as instructed. You do not need to follow up with anesthesia unless specifically instructed to do so.  WOUND CARE INSTRUCTIONS (if applicable):  Keep a dry clean dressing on the anesthesia/puncture wound site if there is drainage.  Once the wound has quit draining you may leave it open to air.  Generally you should leave the bandage intact for twenty four hours unless there is drainage.  If the epidural site drains for more than 36-48 hours please call the anesthesia department.  QUESTIONS?:  Please feel free to call your physician or the hospital operator if you have any questions, and they will be happy to assist you.

## 2012-07-13 ENCOUNTER — Encounter (HOSPITAL_COMMUNITY)
Admission: RE | Admit: 2012-07-13 | Discharge: 2012-07-13 | Disposition: A | Discharge status: Home / Self Care | Payer: Medicare Other | Source: Ambulatory Visit | Attending: Ophthalmology | Admitting: Ophthalmology

## 2012-07-13 ENCOUNTER — Other Ambulatory Visit: Payer: Self-pay

## 2012-07-13 ENCOUNTER — Encounter (HOSPITAL_COMMUNITY): Payer: Self-pay

## 2012-07-13 LAB — BASIC METABOLIC PANEL
BUN: 15 mg/dL (ref 6–23)
CO2: 32 mEq/L (ref 19–32)
Calcium: 9.7 mg/dL (ref 8.4–10.5)
Chloride: 103 mEq/L (ref 96–112)
Creatinine, Ser: 0.9 mg/dL (ref 0.50–1.10)
GFR calc Af Amer: 70 mL/min — ABNORMAL LOW (ref >90)
GFR calc non Af Amer: 60 mL/min — ABNORMAL LOW (ref >90)
Glucose, Bld: 75 mg/dL (ref 70–99)
Potassium: 5 mEq/L (ref 3.5–5.1)
Sodium: 141 mEq/L (ref 135–145)

## 2012-07-13 LAB — HEMOGLOBIN AND HEMATOCRIT, BLOOD
HCT: 38.6 % (ref 36.0–46.0)
Hemoglobin: 13.2 g/dL (ref 12.0–15.0)

## 2012-07-14 LAB — BASIC METABOLIC PANEL
BUN: 12 mg/dL (ref 6–23)
CO2: 30 mEq/L (ref 19–32)
Calcium: 9.2 mg/dL (ref 8.4–10.5)
Chloride: 106 mEq/L (ref 96–112)
Creat: 0.91 mg/dL (ref 0.50–1.10)
Glucose, Bld: 90 mg/dL (ref 70–99)
Potassium: 4.5 mEq/L (ref 3.5–5.3)
Sodium: 143 mEq/L (ref 135–145)

## 2012-07-14 LAB — HEPATIC FUNCTION PANEL
ALT: 13 U/L (ref 0–35)
AST: 17 U/L (ref 0–37)
Albumin: 4 g/dL (ref 3.5–5.2)
Alkaline Phosphatase: 66 U/L (ref 39–117)
Bilirubin, Direct: <0.1 mg/dL (ref 0.0–0.3)
Total Bilirubin: 0.3 mg/dL (ref 0.3–1.2)
Total Protein: 6.1 g/dL (ref 6.0–8.3)

## 2012-07-14 LAB — LIPID PANEL
Cholesterol: 159 mg/dL (ref 0–200)
HDL: 52 mg/dL (ref >39)
LDL Cholesterol: 85 mg/dL (ref 0–99)
Total CHOL/HDL Ratio: 3.1 Ratio
Triglycerides: 108 mg/dL (ref <150)
VLDL: 22 mg/dL (ref 0–40)

## 2012-07-15 ENCOUNTER — Ambulatory Visit (HOSPITAL_COMMUNITY)
Admission: RE | Admit: 2012-07-15 | Discharge: 2012-07-15 | Disposition: A | Discharge status: Home / Self Care | Payer: Medicare Other | Source: Ambulatory Visit | Attending: Nurse Practitioner | Admitting: Nurse Practitioner

## 2012-07-15 ENCOUNTER — Encounter: Payer: Self-pay | Admitting: Nurse Practitioner

## 2012-07-15 DIAGNOSIS — Z1231 Encounter for screening mammogram for malignant neoplasm of breast: Secondary | ICD-10-CM | POA: Insufficient documentation

## 2012-07-15 LAB — VITAMIN D 25 HYDROXY (VIT D DEFICIENCY, FRACTURES): Vit D, 25-Hydroxy: 78 ng/mL (ref 30–89)

## 2012-07-16 ENCOUNTER — Other Ambulatory Visit: Payer: Self-pay | Admitting: *Deleted

## 2012-07-19 ENCOUNTER — Other Ambulatory Visit: Payer: Self-pay | Admitting: Family Medicine

## 2012-07-19 NOTE — Telephone Encounter (Signed)
Ok wis five ref seen last mo

## 2012-07-21 MED ORDER — CYCLOPENTOLATE-PHENYLEPHRINE 0.2-1 % OP SOLN
OPHTHALMIC | Status: AC
  Filled 2012-07-21: qty 2

## 2012-07-21 MED ORDER — LIDOCAINE HCL 3.5 % OP GEL
OPHTHALMIC | Status: AC
  Filled 2012-07-21: qty 5

## 2012-07-21 MED ORDER — LIDOCAINE HCL (PF) 1 % IJ SOLN
INTRAMUSCULAR | Status: AC
  Filled 2012-07-21: qty 2

## 2012-07-21 MED ORDER — TETRACAINE HCL 0.5 % OP SOLN
OPHTHALMIC | Status: AC
  Filled 2012-07-21: qty 2

## 2012-07-21 MED ORDER — PHENYLEPHRINE HCL 2.5 % OP SOLN
OPHTHALMIC | Status: AC
  Filled 2012-07-21: qty 2

## 2012-07-21 MED ORDER — NEOMYCIN-POLYMYXIN-DEXAMETH 3.5-10000-0.1 OP OINT
TOPICAL_OINTMENT | OPHTHALMIC | Status: AC
  Filled 2012-07-21: qty 3.5

## 2012-07-22 ENCOUNTER — Encounter (HOSPITAL_COMMUNITY)
Admission: RE | Disposition: A | Discharge status: Home / Self Care | Payer: Self-pay | Source: Ambulatory Visit | Attending: Ophthalmology

## 2012-07-22 ENCOUNTER — Encounter (HOSPITAL_COMMUNITY): Payer: Self-pay | Admitting: *Deleted

## 2012-07-22 ENCOUNTER — Encounter (HOSPITAL_COMMUNITY): Payer: Self-pay | Admitting: Anesthesiology

## 2012-07-22 ENCOUNTER — Ambulatory Visit (HOSPITAL_COMMUNITY)
Admission: RE | Admit: 2012-07-22 | Discharge: 2012-07-22 | Disposition: A | Discharge status: Home / Self Care | Payer: Medicare Other | Source: Ambulatory Visit | Attending: Ophthalmology | Admitting: Ophthalmology

## 2012-07-22 ENCOUNTER — Ambulatory Visit (HOSPITAL_COMMUNITY): Payer: Medicare Other | Admitting: Anesthesiology

## 2012-07-22 DIAGNOSIS — I10 Essential (primary) hypertension: Secondary | ICD-10-CM | POA: Insufficient documentation

## 2012-07-22 DIAGNOSIS — Z01810 Encounter for preprocedural cardiovascular examination: Secondary | ICD-10-CM | POA: Insufficient documentation

## 2012-07-22 DIAGNOSIS — Z79899 Other long term (current) drug therapy: Secondary | ICD-10-CM | POA: Insufficient documentation

## 2012-07-22 DIAGNOSIS — H2510 Age-related nuclear cataract, unspecified eye: Secondary | ICD-10-CM | POA: Insufficient documentation

## 2012-07-22 DIAGNOSIS — Z01812 Encounter for preprocedural laboratory examination: Secondary | ICD-10-CM | POA: Insufficient documentation

## 2012-07-22 HISTORY — PX: CATARACT EXTRACTION W/PHACO: SHX586

## 2012-07-22 SURGERY — PHACOEMULSIFICATION, CATARACT, WITH IOL INSERTION
Anesthesia: Monitor Anesthesia Care | Site: Eye | Laterality: Right | Wound class: Clean

## 2012-07-22 MED ORDER — LIDOCAINE 3.5 % OP GEL OPTIME - NO CHARGE
OPHTHALMIC | Status: DC | PRN
  Administered 2012-07-22: 1 [drp] via OPHTHALMIC

## 2012-07-22 MED ORDER — TETRACAINE HCL 0.5 % OP SOLN
1.0000 [drp] | OPHTHALMIC | Status: AC
  Administered 2012-07-22 (×3): 1 [drp] via OPHTHALMIC

## 2012-07-22 MED ORDER — BSS IO SOLN
INTRAOCULAR | Status: DC | PRN
  Administered 2012-07-22: 15 mL via INTRAOCULAR

## 2012-07-22 MED ORDER — EPINEPHRINE HCL 1 MG/ML IJ SOLN
INTRAMUSCULAR | Status: AC
  Filled 2012-07-22: qty 1

## 2012-07-22 MED ORDER — MIDAZOLAM HCL 2 MG/2ML IJ SOLN
INTRAMUSCULAR | Status: AC
  Filled 2012-07-22: qty 2

## 2012-07-22 MED ORDER — LIDOCAINE HCL 3.5 % OP GEL
1.0000 "application " | Freq: Once | OPHTHALMIC | Status: AC
  Administered 2012-07-22: 1 via OPHTHALMIC

## 2012-07-22 MED ORDER — LACTATED RINGERS IV SOLN
INTRAVENOUS | Status: DC
  Administered 2012-07-22: 1000 mL via INTRAVENOUS

## 2012-07-22 MED ORDER — PHENYLEPHRINE HCL 2.5 % OP SOLN
1.0000 [drp] | OPHTHALMIC | Status: AC
  Administered 2012-07-22 (×3): 1 [drp] via OPHTHALMIC

## 2012-07-22 MED ORDER — EPINEPHRINE HCL 1 MG/ML IJ SOLN
INTRAOCULAR | Status: DC | PRN
  Administered 2012-07-22: 08:00:00

## 2012-07-22 MED ORDER — LIDOCAINE HCL (PF) 1 % IJ SOLN
INTRAMUSCULAR | Status: DC | PRN
  Administered 2012-07-22: .4 mL

## 2012-07-22 MED ORDER — PROVISC 10 MG/ML IO SOLN
INTRAOCULAR | Status: DC | PRN
  Administered 2012-07-22: 8.5 mg via INTRAOCULAR

## 2012-07-22 MED ORDER — MIDAZOLAM HCL 2 MG/2ML IJ SOLN
1.0000 mg | INTRAMUSCULAR | Status: DC | PRN
  Administered 2012-07-22: 2 mg via INTRAVENOUS

## 2012-07-22 MED ORDER — CYCLOPENTOLATE-PHENYLEPHRINE 0.2-1 % OP SOLN
1.0000 [drp] | OPHTHALMIC | Status: AC
  Administered 2012-07-22 (×3): 1 [drp] via OPHTHALMIC

## 2012-07-22 MED ORDER — POVIDONE-IODINE 5 % OP SOLN
OPHTHALMIC | Status: DC | PRN
  Administered 2012-07-22: 1 via OPHTHALMIC

## 2012-07-22 MED ORDER — NEOMYCIN-POLYMYXIN-DEXAMETH 0.1 % OP OINT
TOPICAL_OINTMENT | OPHTHALMIC | Status: DC | PRN
  Administered 2012-07-22: 1 via OPHTHALMIC

## 2012-07-22 SURGICAL SUPPLY — 32 items
CAPSULAR TENSION RING-AMO (OPHTHALMIC RELATED) IMPLANT
CLOTH BEACON ORANGE TIMEOUT ST (SAFETY) ×2 IMPLANT
EYE SHIELD UNIVERSAL CLEAR (GAUZE/BANDAGES/DRESSINGS) ×2 IMPLANT
GLOVE BIO SURGEON STRL SZ 6.5 (GLOVE) ×2 IMPLANT
GLOVE BIOGEL PI IND STRL 6.5 (GLOVE) IMPLANT
GLOVE BIOGEL PI IND STRL 7.0 (GLOVE) IMPLANT
GLOVE BIOGEL PI IND STRL 7.5 (GLOVE) IMPLANT
GLOVE BIOGEL PI INDICATOR 6.5 (GLOVE)
GLOVE BIOGEL PI INDICATOR 7.0 (GLOVE)
GLOVE BIOGEL PI INDICATOR 7.5 (GLOVE)
GLOVE ECLIPSE 6.5 STRL STRAW (GLOVE) IMPLANT
GLOVE ECLIPSE 7.0 STRL STRAW (GLOVE) IMPLANT
GLOVE ECLIPSE 7.5 STRL STRAW (GLOVE) IMPLANT
GLOVE EXAM NITRILE LRG STRL (GLOVE) IMPLANT
GLOVE EXAM NITRILE MD LF STRL (GLOVE) ×2 IMPLANT
GLOVE SKINSENSE NS SZ6.5 (GLOVE)
GLOVE SKINSENSE NS SZ7.0 (GLOVE)
GLOVE SKINSENSE STRL SZ6.5 (GLOVE) IMPLANT
GLOVE SKINSENSE STRL SZ7.0 (GLOVE) IMPLANT
KIT VITRECTOMY (OPHTHALMIC RELATED) IMPLANT
PAD ARMBOARD 7.5X6 YLW CONV (MISCELLANEOUS) ×2 IMPLANT
PROC W NO LENS (INTRAOCULAR LENS)
PROC W SPEC LENS (INTRAOCULAR LENS)
PROCESS W NO LENS (INTRAOCULAR LENS) IMPLANT
PROCESS W SPEC LENS (INTRAOCULAR LENS) IMPLANT
RING MALYGIN (MISCELLANEOUS) IMPLANT
SIGHTPATH CAT PROC W REG LENS (Ophthalmic Related) ×2 IMPLANT
SYR TB 1ML LL NO SAFETY (SYRINGE) ×2 IMPLANT
TAPE SURG TRANSPORE 1 IN (GAUZE/BANDAGES/DRESSINGS) ×1 IMPLANT
TAPE SURGICAL TRANSPORE 1 IN (GAUZE/BANDAGES/DRESSINGS) ×1
VISCOELASTIC ADDITIONAL (OPHTHALMIC RELATED) IMPLANT
WATER STERILE IRR 250ML POUR (IV SOLUTION) ×2 IMPLANT

## 2012-07-22 NOTE — Op Note (Signed)
Date of Admission: 07/22/2012  Date of Surgery: 07/22/2012  Pre-Op Dx: Cataract  Right  Eye  Post-Op Dx: Nuclear Cataract  Right  Eye,  Dx Code 366.16  Surgeon: Gemma Payor, M.D.  Assistants: None  Anesthesia: Topical with MAC  Indications: Painless, progressive loss of vision with compromise of daily activities.  Surgery: Cataract Extraction with Intraocular lens Implant Right Eye  Discription: The patient had dilating drops and viscous lidocaine placed into the left eye in the pre-op holding area. After transfer to the operating room, a time out was performed. The patient was then prepped and draped. Beginning with a 75 degree blade a paracentesis port was made at the surgeon's 2 o'clock position. The anterior chamber was then filled with 1% non-preserved lidocaine. This was followed by filling the anterior chamber with Provisc. A bent cystatome needle was used to create a continuous tear capsulotomy. Hydrodissection was performed with balanced salt solution on a Fine canula. The lens nucleus was then removed using the phacoemulsification handpiece. Residual cortex was removed with the I&A handpiece. The anterior chamber and capsular bag were refilled with Provisc. A posterior chamber intraocular lens was placed into the capsular bag with it's injector. The implant was positioned with the Kuglan hook. The Provisc was then removed from the anterior chamber and capsular bag with the I&A handpiece. Stromal hydration of the main incision and paracentesis port was performed with BSS on a Fine canula. The wounds were tested for leak which was negative. The patient tolerated the procedure well. There were no operative complications. The patient was then transferred to the recovery room in stable condition.  Complications: None  Specimen: None  EBL: None  Prosthetic device: B&L enVista, MX60, power 22.0D.

## 2012-07-22 NOTE — Transfer of Care (Signed)
Immediate Anesthesia Transfer of Care Note  Patient: Katherine Lane  Procedure(s) Performed: Procedure(s) with comments: CATARACT EXTRACTION PHACO AND INTRAOCULAR LENS PLACEMENT (IOC) (Right) - CDE:  18.93  Patient Location: Short Stay  Anesthesia Type:MAC  Level of Consciousness: awake, alert , oriented and patient cooperative  Airway & Oxygen Therapy: Patient Spontanous Breathing  Post-op Assessment: Report given to PACU RN and Post -op Vital signs reviewed and stable  Post vital signs: Reviewed and stable  Complications: No apparent anesthesia complications

## 2012-07-22 NOTE — Anesthesia Preprocedure Evaluation (Signed)
Anesthesia Evaluation  Patient identified by MRN, date of birth, ID band Patient awake    Reviewed: Allergy & Precautions, H&P , NPO status , Patient's Chart, lab work & pertinent test results, reviewed documented beta blocker date and time   History of Anesthesia Complications (+) PONV  Airway Mallampati: I TM Distance: >3 FB Neck ROM: Full    Dental no notable dental hx. (+) Partial Upper   Pulmonary    Pulmonary exam normal       Cardiovascular hypertension, Pt. on medications and Pt. on home beta blockers Rhythm:Regular Rate:Normal     Neuro/Psych  Neuromuscular disease    GI/Hepatic negative GI ROS, Neg liver ROS,   Endo/Other  negative endocrine ROS  Renal/GU negative Renal ROS     Musculoskeletal negative musculoskeletal ROS (+)   Abdominal Normal abdominal exam  (+)   Peds  Hematology negative hematology ROS (+)   Anesthesia Other Findings   Reproductive/Obstetrics negative OB ROS                           Anesthesia Physical Anesthesia Plan  ASA: II  Anesthesia Plan: MAC   Post-op Pain Management:    Induction: Intravenous  Airway Management Planned: Nasal Cannula  Additional Equipment:   Intra-op Plan:   Post-operative Plan:   Informed Consent: I have reviewed the patients History and Physical, chart, labs and discussed the procedure including the risks, benefits and alternatives for the proposed anesthesia with the patient or authorized representative who has indicated his/her understanding and acceptance.     Plan Discussed with:   Anesthesia Plan Comments:         Anesthesia Quick Evaluation

## 2012-07-22 NOTE — H&P (Signed)
I have reviewed the H&P, the patient was re-examined, and I have identified no interval changes in medical condition and plan of care since the history and physical of record  

## 2012-07-22 NOTE — Anesthesia Postprocedure Evaluation (Signed)
  Anesthesia Post-op Note  Patient: Katherine Lane  Procedure(s) Performed: Procedure(s) with comments: CATARACT EXTRACTION PHACO AND INTRAOCULAR LENS PLACEMENT (IOC) (Right) - CDE:  18.93  Patient Location: PACU  Anesthesia Type:MAC  Level of Consciousness: awake, alert , oriented and patient cooperative  Airway and Oxygen Therapy: Patient Spontanous Breathing  Post-op Pain: none  Post-op Assessment: Post-op Vital signs reviewed, Patient's Cardiovascular Status Stable, Respiratory Function Stable, Patent Airway and Pain level controlled  Post-op Vital Signs: Reviewed and stable  Complications: No apparent anesthesia complications

## 2012-07-22 NOTE — Anesthesia Postprocedure Evaluation (Signed)
  Anesthesia Post-op Note  Patient: Katherine Lane  Procedure(s) Performed: Procedure(s) with comments: CATARACT EXTRACTION PHACO AND INTRAOCULAR LENS PLACEMENT (IOC) (Right) - CDE:  18.93  Patient Location: Short Stay  Anesthesia Type:MAC  Level of Consciousness: awake, alert , oriented and patient cooperative  Airway and Oxygen Therapy: Patient Spontanous Breathing  Post-op Pain: none  Post-op Assessment: Post-op Vital signs reviewed, Patient's Cardiovascular Status Stable, Respiratory Function Stable, Patent Airway and Pain level controlled  Post-op Vital Signs: Reviewed and stable  Complications: No apparent anesthesia complications

## 2012-07-23 ENCOUNTER — Encounter (HOSPITAL_COMMUNITY): Payer: Self-pay | Admitting: Ophthalmology

## 2012-07-23 ENCOUNTER — Other Ambulatory Visit: Payer: Self-pay | Admitting: Family Medicine

## 2012-07-23 ENCOUNTER — Telehealth: Payer: Self-pay | Admitting: Family Medicine

## 2012-07-23 NOTE — Telephone Encounter (Signed)
RX refill called in to [facility]. Patient notified.

## 2012-07-23 NOTE — Telephone Encounter (Signed)
Patient needs a refill of Ambien to [facility].

## 2012-07-23 NOTE — Telephone Encounter (Signed)
Ok with five ref 

## 2012-07-23 NOTE — Telephone Encounter (Signed)
Ok ref for 6 mo total

## 2012-07-27 ENCOUNTER — Encounter: Payer: Self-pay | Admitting: Family Medicine

## 2012-08-03 ENCOUNTER — Encounter (HOSPITAL_COMMUNITY): Payer: Self-pay | Admitting: Pharmacy Technician

## 2012-08-05 ENCOUNTER — Encounter (HOSPITAL_COMMUNITY)
Admission: RE | Admit: 2012-08-05 | Discharge: 2012-08-05 | Disposition: A | Discharge status: Home / Self Care | Payer: Medicare Other | Source: Ambulatory Visit | Attending: Ophthalmology | Admitting: Ophthalmology

## 2012-08-05 ENCOUNTER — Encounter (HOSPITAL_COMMUNITY): Payer: Self-pay

## 2012-08-05 MED ORDER — ONDANSETRON HCL 4 MG/2ML IJ SOLN: 4.0000 mg | Freq: Once | INTRAMUSCULAR | Status: AC | PRN

## 2012-08-05 MED ORDER — FENTANYL CITRATE 0.05 MG/ML IJ SOLN: 25.0000 ug | INTRAMUSCULAR | Status: DC | PRN

## 2012-08-06 MED ORDER — TETRACAINE HCL 0.5 % OP SOLN
OPHTHALMIC | Status: AC
  Filled 2012-08-06: qty 2

## 2012-08-06 MED ORDER — NEOMYCIN-POLYMYXIN-DEXAMETH 3.5-10000-0.1 OP OINT
TOPICAL_OINTMENT | OPHTHALMIC | Status: AC
  Filled 2012-08-06: qty 3.5

## 2012-08-06 MED ORDER — LIDOCAINE HCL 3.5 % OP GEL
OPHTHALMIC | Status: AC
  Filled 2012-08-06: qty 5

## 2012-08-06 MED ORDER — LIDOCAINE HCL (PF) 1 % IJ SOLN
INTRAMUSCULAR | Status: AC
  Filled 2012-08-06: qty 2

## 2012-08-06 MED ORDER — CYCLOPENTOLATE-PHENYLEPHRINE 0.2-1 % OP SOLN
OPHTHALMIC | Status: AC
  Filled 2012-08-06: qty 2

## 2012-08-09 ENCOUNTER — Ambulatory Visit (HOSPITAL_COMMUNITY): Payer: Medicare Other | Admitting: Anesthesiology

## 2012-08-09 ENCOUNTER — Ambulatory Visit (HOSPITAL_COMMUNITY)
Admission: RE | Admit: 2012-08-09 | Discharge: 2012-08-09 | Disposition: A | Discharge status: Home / Self Care | Payer: Medicare Other | Source: Ambulatory Visit | Attending: Ophthalmology | Admitting: Ophthalmology

## 2012-08-09 ENCOUNTER — Encounter (HOSPITAL_COMMUNITY): Payer: Self-pay | Admitting: *Deleted

## 2012-08-09 ENCOUNTER — Encounter (HOSPITAL_COMMUNITY): Payer: Self-pay | Admitting: Anesthesiology

## 2012-08-09 ENCOUNTER — Encounter (HOSPITAL_COMMUNITY)
Admission: RE | Disposition: A | Discharge status: Home / Self Care | Payer: Self-pay | Source: Ambulatory Visit | Attending: Ophthalmology

## 2012-08-09 DIAGNOSIS — Z79899 Other long term (current) drug therapy: Secondary | ICD-10-CM | POA: Insufficient documentation

## 2012-08-09 DIAGNOSIS — F329 Major depressive disorder, single episode, unspecified: Secondary | ICD-10-CM | POA: Insufficient documentation

## 2012-08-09 DIAGNOSIS — Z7982 Long term (current) use of aspirin: Secondary | ICD-10-CM | POA: Insufficient documentation

## 2012-08-09 DIAGNOSIS — M129 Arthropathy, unspecified: Secondary | ICD-10-CM | POA: Insufficient documentation

## 2012-08-09 DIAGNOSIS — F3289 Other specified depressive episodes: Secondary | ICD-10-CM | POA: Insufficient documentation

## 2012-08-09 DIAGNOSIS — H269 Unspecified cataract: Secondary | ICD-10-CM | POA: Insufficient documentation

## 2012-08-09 DIAGNOSIS — G709 Myoneural disorder, unspecified: Secondary | ICD-10-CM | POA: Insufficient documentation

## 2012-08-09 DIAGNOSIS — I10 Essential (primary) hypertension: Secondary | ICD-10-CM | POA: Insufficient documentation

## 2012-08-09 DIAGNOSIS — Z885 Allergy status to narcotic agent status: Secondary | ICD-10-CM | POA: Insufficient documentation

## 2012-08-09 DIAGNOSIS — F411 Generalized anxiety disorder: Secondary | ICD-10-CM | POA: Insufficient documentation

## 2012-08-09 HISTORY — PX: CATARACT EXTRACTION W/PHACO: SHX586

## 2012-08-09 SURGERY — PHACOEMULSIFICATION, CATARACT, WITH IOL INSERTION
Anesthesia: Monitor Anesthesia Care | Site: Eye | Laterality: Left | Wound class: Clean

## 2012-08-09 MED ORDER — BSS IO SOLN
INTRAOCULAR | Status: DC | PRN
  Administered 2012-08-09: 15 mL via INTRAOCULAR

## 2012-08-09 MED ORDER — LIDOCAINE HCL 3.5 % OP GEL
1.0000 "application " | Freq: Once | OPHTHALMIC | Status: AC
  Administered 2012-08-09: 1 via OPHTHALMIC

## 2012-08-09 MED ORDER — MIDAZOLAM HCL 2 MG/2ML IJ SOLN
1.0000 mg | INTRAMUSCULAR | Status: DC | PRN
  Administered 2012-08-09: 2 mg via INTRAVENOUS

## 2012-08-09 MED ORDER — EPINEPHRINE HCL 1 MG/ML IJ SOLN
INTRAMUSCULAR | Status: AC
  Filled 2012-08-09: qty 1

## 2012-08-09 MED ORDER — MIDAZOLAM HCL 2 MG/2ML IJ SOLN
INTRAMUSCULAR | Status: AC
  Filled 2012-08-09: qty 2

## 2012-08-09 MED ORDER — TETRACAINE HCL 0.5 % OP SOLN
1.0000 [drp] | OPHTHALMIC | Status: AC
  Administered 2012-08-09 (×3): 1 [drp] via OPHTHALMIC

## 2012-08-09 MED ORDER — CYCLOPENTOLATE-PHENYLEPHRINE 0.2-1 % OP SOLN
1.0000 [drp] | OPHTHALMIC | Status: AC
Start: 2012-08-09 — End: 2012-08-09
  Administered 2012-08-09 (×3): 1 [drp] via OPHTHALMIC

## 2012-08-09 MED ORDER — PHENYLEPHRINE HCL 2.5 % OP SOLN
1.0000 [drp] | OPHTHALMIC | Status: AC
  Administered 2012-08-09 (×3): 1 [drp] via OPHTHALMIC

## 2012-08-09 MED ORDER — LIDOCAINE HCL (PF) 1 % IJ SOLN
INTRAMUSCULAR | Status: DC | PRN
  Administered 2012-08-09: .4 mL

## 2012-08-09 MED ORDER — NEOMYCIN-POLYMYXIN-DEXAMETH 0.1 % OP OINT
TOPICAL_OINTMENT | OPHTHALMIC | Status: DC | PRN
  Administered 2012-08-09: 1 via OPHTHALMIC

## 2012-08-09 MED ORDER — BSS IO SOLN
INTRAOCULAR | Status: DC | PRN
  Administered 2012-08-09: 13:00:00

## 2012-08-09 MED ORDER — POVIDONE-IODINE 5 % OP SOLN
OPHTHALMIC | Status: DC | PRN
  Administered 2012-08-09: 1 via OPHTHALMIC

## 2012-08-09 MED ORDER — LACTATED RINGERS IV SOLN
INTRAVENOUS | Status: DC
  Administered 2012-08-09: 13:00:00 via INTRAVENOUS

## 2012-08-09 MED ORDER — PROVISC 10 MG/ML IO SOLN
INTRAOCULAR | Status: DC | PRN
  Administered 2012-08-09: 8.5 mg via INTRAOCULAR

## 2012-08-09 SURGICAL SUPPLY — 32 items

## 2012-08-09 NOTE — Anesthesia Preprocedure Evaluation (Signed)
Anesthesia Evaluation  Patient identified by MRN, date of birth, ID band Patient awake    Reviewed: Allergy & Precautions, H&P , NPO status , Patient's Chart, lab work & pertinent test results, reviewed documented beta blocker date and time   History of Anesthesia Complications (+) PONV  Airway Mallampati: I TM Distance: >3 FB Neck ROM: Full    Dental no notable dental hx. (+) Partial Upper   Pulmonary    Pulmonary exam normal       Cardiovascular hypertension, Pt. on medications and Pt. on home beta blockers Rhythm:Regular Rate:Normal     Neuro/Psych  Headaches, PSYCHIATRIC DISORDERS Anxiety Depression  Neuromuscular disease    GI/Hepatic negative GI ROS, Neg liver ROS,   Endo/Other  negative endocrine ROS  Renal/GU negative Renal ROS     Musculoskeletal negative musculoskeletal ROS (+)   Abdominal Normal abdominal exam  (+)   Peds  Hematology negative hematology ROS (+)   Anesthesia Other Findings   Reproductive/Obstetrics negative OB ROS                           Anesthesia Physical Anesthesia Plan  ASA: II  Anesthesia Plan: MAC   Post-op Pain Management:    Induction: Intravenous  Airway Management Planned: Nasal Cannula  Additional Equipment:   Intra-op Plan:   Post-operative Plan:   Informed Consent: I have reviewed the patients History and Physical, chart, labs and discussed the procedure including the risks, benefits and alternatives for the proposed anesthesia with the patient or authorized representative who has indicated his/her understanding and acceptance.     Plan Discussed with:   Anesthesia Plan Comments:         Anesthesia Quick Evaluation

## 2012-08-09 NOTE — Transfer of Care (Signed)
Immediate Anesthesia Transfer of Care Note  Patient: Katherine Lane  Procedure(s) Performed: Procedure(s) with comments: CATARACT EXTRACTION PHACO AND INTRAOCULAR LENS PLACEMENT (IOC) (Left) - CDE: 17.21  Patient Location: Short Stay  Anesthesia Type:MAC  Level of Consciousness: awake, alert , oriented and patient cooperative  Airway & Oxygen Therapy: Patient Spontanous Breathing  Post-op Assessment: Report given to PACU RN and Post -op Vital signs reviewed and stable  Post vital signs: Reviewed and stable  Complications: No apparent anesthesia complications

## 2012-08-09 NOTE — H&P (Signed)
I have reviewed the H&P, the patient was re-examined, and I have identified no interval changes in medical condition and plan of care since the history and physical of record  

## 2012-08-09 NOTE — Op Note (Signed)
Date of Admission: 08/09/2012  Date of Surgery: 08/09/2012  Pre-Op Dx: Cataract  Left  Eye  Post-Op Dx: Cataract  Left  Eye,  Dx Code 366.16  Surgeon: Gemma Payor, M.D.  Assistants: None  Anesthesia: Topical with MAC  Indications: Painless, progressive loss of vision with compromise of daily activities.  Surgery: Cataract Extraction with Intraocular lens Implant Left Eye  Discription: The patient had dilating drops and viscous lidocaine placed into the left eye in the pre-op holding area. After transfer to the operating room, a time out was performed. The patient was then prepped and draped. Beginning with a 75 degree blade a paracentesis port was made at the surgeon's 2 o'clock position. The anterior chamber was then filled with 1% non-preserved lidocaine. This was followed by filling the anterior chamber with Provisc. A bent cystatome needle was used to create a continuous tear capsulotomy. Hydrodissection was performed with balanced salt solution on a Fine canula. The lens nucleus was then removed using the phacoemulsification handpiece. Residual cortex was removed with the I&A handpiece. The anterior chamber and capsular bag were refilled with Provisc. A posterior chamber intraocular lens was placed into the capsular bag with it's injector. The implant was positioned with the Kuglan hook. The Provisc was then removed from the anterior chamber and capsular bag with the I&A handpiece. Stromal hydration of the main incision and paracentesis port was performed with BSS on a Fine canula. The wounds were tested for leak which was negative. The patient tolerated the procedure well. There were no operative complications. The patient was then transferred to the recovery room in stable condition.  Complications: None  Specimen: None  EBL: None  Prosthetic device: B&L enVista, MX60, power 21.5D, SN [id number].

## 2012-08-09 NOTE — Anesthesia Postprocedure Evaluation (Signed)
  Anesthesia Post-op Note  Patient: Katherine Lane  Procedure(s) Performed: Procedure(s) with comments: CATARACT EXTRACTION PHACO AND INTRAOCULAR LENS PLACEMENT (IOC) (Left) - CDE: 17.21  Patient Location: Short Stay  Anesthesia Type:MAC  Level of Consciousness: awake, alert , oriented and patient cooperative  Airway and Oxygen Therapy: Patient Spontanous Breathing  Post-op Pain: none  Post-op Assessment: Post-op Vital signs reviewed, Patient's Cardiovascular Status Stable, Respiratory Function Stable, Patent Airway and Pain level controlled  Post-op Vital Signs: Reviewed and stable  Complications: No apparent anesthesia complications

## 2012-08-10 ENCOUNTER — Encounter (HOSPITAL_COMMUNITY): Payer: Self-pay | Admitting: Ophthalmology

## 2012-08-24 ENCOUNTER — Other Ambulatory Visit (HOSPITAL_COMMUNITY): Payer: Medicare Other

## 2012-09-17 ENCOUNTER — Other Ambulatory Visit: Payer: Self-pay | Admitting: Family Medicine

## 2012-09-21 ENCOUNTER — Other Ambulatory Visit: Payer: Self-pay | Admitting: Family Medicine

## 2012-09-21 NOTE — Telephone Encounter (Signed)
She may have 3 refills total, followup office visit November

## 2012-09-22 ENCOUNTER — Telehealth: Payer: Self-pay | Admitting: Family Medicine

## 2012-09-22 NOTE — Telephone Encounter (Signed)
FYI she is leaving early in the morning to go out of town

## 2012-09-22 NOTE — Telephone Encounter (Signed)
Med was called into [facility] . Patient notified.

## 2012-09-22 NOTE — Telephone Encounter (Signed)
Patient needs Rx for ativan generic    [address]

## 2012-10-11 ENCOUNTER — Other Ambulatory Visit: Payer: Self-pay | Admitting: Family Medicine

## 2012-12-17 ENCOUNTER — Other Ambulatory Visit: Payer: Self-pay | Admitting: Family Medicine

## 2013-01-05 ENCOUNTER — Telehealth: Payer: Self-pay | Admitting: Nurse Practitioner

## 2013-01-05 ENCOUNTER — Encounter: Payer: Self-pay | Admitting: Nurse Practitioner

## 2013-01-05 ENCOUNTER — Ambulatory Visit (INDEPENDENT_AMBULATORY_CARE_PROVIDER_SITE_OTHER): Payer: Medicare Other | Admitting: Nurse Practitioner

## 2013-01-05 VITALS — BP 132/80 | Temp 98.4°F | Ht 62.5 in | Wt 137.0 lb

## 2013-01-05 DIAGNOSIS — H6691 Otitis media, unspecified, right ear: Secondary | ICD-10-CM

## 2013-01-05 DIAGNOSIS — H6690 Otitis media, unspecified, unspecified ear: Secondary | ICD-10-CM

## 2013-01-05 MED ORDER — ENALAPRIL MALEATE 20 MG PO TABS: 20.0000 mg | ORAL_TABLET | Freq: Every evening | ORAL | Status: DC

## 2013-01-05 MED ORDER — VERAPAMIL HCL ER 240 MG PO TBCR: EXTENDED_RELEASE_TABLET | ORAL | Status: DC

## 2013-01-05 MED ORDER — AMOXICILLIN-POT CLAVULANATE 875-125 MG PO TABS: 1.0000 | ORAL_TABLET | Freq: Two times a day (BID) | ORAL | Status: DC

## 2013-01-05 MED ORDER — ZOLPIDEM TARTRATE 10 MG PO TABS: 10.0000 mg | ORAL_TABLET | Freq: Every evening | ORAL | Status: DC | PRN

## 2013-01-05 MED ORDER — HYDROCORTISONE ACE-PRAMOXINE 1-1 % RE CREA: 1.0000 "application " | TOPICAL_CREAM | Freq: Two times a day (BID) | RECTAL | Status: DC

## 2013-01-05 NOTE — Telephone Encounter (Signed)
Patient says that pharmacy did not have medication for hemorrhoids. She would like this called in.    Robbie Lis

## 2013-01-05 NOTE — Patient Instructions (Signed)
Use over the counter Nasacort AC and an antihistamine.

## 2013-01-06 ENCOUNTER — Encounter: Payer: Self-pay | Admitting: Nurse Practitioner

## 2013-01-06 NOTE — Progress Notes (Signed)
Subjective:  Presents with complaints of right ear pain and drainage. Pain began about a month ago with some head congestion. After this had some drainage with slight odor from the right ear for about 2 weeks. The pain and drainage have resolved. Continues to have pressure in the right ear. No fever. Occasional mild headache. No sore throat. No cough. Sneezing and clear drainage from the nose mainly in the mornings. Also requesting medication for hemorrhoids. Defers exam today. Has a physical scheduled in the near future. Requesting refills of some of her medications.  Objective:   BP 132/80  Temp(Src) 98.4 F (36.9 C) (Oral)  Ht 5' 2.5" (1.588 m)  Wt 137 lb (62.143 kg)  BMI 24.64 kg/m2 NAD. Alert, oriented. Left TM minimal clear effusion, no erythema. Right TM no obvious rupture noted at this time, effusion noted with moderate erythema. Pharynx injected. Neck supple with mild soft nontender adenopathy. Lungs clear. Heart regular rate rhythm.  Assessment:Otitis media, right  probable recent TM rupture  Plan: Meds ordered this encounter  Medications  . amoxicillin-clavulanate (AUGMENTIN) 875-125 MG per tablet    Sig: Take 1 tablet by mouth 2 (two) times daily.    Dispense:  20 tablet    Refill:  0    Order Specific Question:  Supervising Provider    Answer:  Merlyn Albert [[id number]]  . zolpidem (AMBIEN) 10 MG tablet    Sig: Take 1 tablet (10 mg total) by mouth at bedtime as needed for sleep.    Dispense:  30 tablet    Refill:  2    Order Specific Question:  Supervising Provider    Answer:  Merlyn Albert [[id number]]  . verapamil (CALAN-SR) 240 MG CR tablet    Sig: TAKE (1) TABLET BY MOUTH EACH MORNING.    Dispense:  30 tablet    Refill:  2    Order Specific Question:  Supervising Provider    Answer:  Merlyn Albert [[id number]]  . enalapril (VASOTEC) 20 MG tablet    Sig: Take 1 tablet (20 mg total) by mouth every evening.    Dispense:  30 tablet    Refill:  2    Order Specific  Question:  Supervising Provider    Answer:  Merlyn Albert [[id number]]  . pramoxine-hydrocortisone (PROCTOCREAM-HC) 1-1 % rectal cream    Sig: Place 1 application rectally 2 (two) times daily. Prn hemorrhoids    Dispense:  30 g    Refill:  2    Order Specific Question:  Supervising Provider    Answer:  Merlyn Albert [[id number]]   OTC antihistamines and Nasacort AQ as directed. Recheck her ear at her wellness checkup, call back sooner if no improvement.

## 2013-01-10 NOTE — Telephone Encounter (Signed)
Has this been completed?

## 2013-01-12 ENCOUNTER — Ambulatory Visit (INDEPENDENT_AMBULATORY_CARE_PROVIDER_SITE_OTHER): Payer: Medicare Other | Admitting: Nurse Practitioner

## 2013-01-12 ENCOUNTER — Encounter: Payer: Self-pay | Admitting: Nurse Practitioner

## 2013-01-12 ENCOUNTER — Telehealth: Payer: Self-pay | Admitting: Family Medicine

## 2013-01-12 VITALS — BP 112/68 | Temp 98.3°F | Ht 62.0 in | Wt 141.6 lb

## 2013-01-12 DIAGNOSIS — G500 Trigeminal neuralgia: Secondary | ICD-10-CM

## 2013-01-12 MED ORDER — METHYLPREDNISOLONE ACETATE 40 MG/ML IJ SUSP
40.0000 mg | Freq: Once | INTRAMUSCULAR | Status: AC
  Administered 2013-01-12: 40 mg via INTRAMUSCULAR

## 2013-01-12 MED ORDER — TRAMADOL HCL 50 MG PO TABS: 50.0000 mg | ORAL_TABLET | Freq: Four times a day (QID) | ORAL | Status: DC | PRN

## 2013-01-12 MED ORDER — PREDNISONE 20 MG PO TABS: ORAL_TABLET | ORAL | Status: DC

## 2013-01-12 MED ORDER — METOPROLOL SUCCINATE ER 25 MG PO TB24: ORAL_TABLET | ORAL | Status: DC

## 2013-01-12 NOTE — Telephone Encounter (Signed)
Patient was seen last week for an ear infection and has been on an antibiotic she says for 5 or 6 days. Patient says that she is now having a horrible pain in her other ear, getting better with tylenol, but after the tylenol wears off she is still in pain. Please advise.  Katherine Lane

## 2013-01-12 NOTE — Telephone Encounter (Signed)
2 choice. 1- new antibiotic and Tramadol for pain, or 2- we can see her today.

## 2013-01-12 NOTE — Telephone Encounter (Signed)
Patient called back and said that she just wanted to come in. I gave her an appointment today with Eber Jones.

## 2013-01-15 ENCOUNTER — Encounter: Payer: Self-pay | Admitting: Nurse Practitioner

## 2013-01-15 DIAGNOSIS — G500 Trigeminal neuralgia: Secondary | ICD-10-CM | POA: Insufficient documentation

## 2013-01-15 NOTE — Progress Notes (Signed)
Subjective:  Presents complaints of pain towards the left ear area. Occasional extremely sharp pain in the left parietal area. No fever. No cough runny nose. Continues to take Augmentin see previous note. No drainage from the left ear. No numbness or weakness of the face. No difficulty speaking or swallowing. No visual changes. Patient has had 2 similar episodes one on each side of the head see paper chart. Was diagnosed with trigeminal neuralgia.  Objective:   BP 112/68  Temp(Src) 98.3 F (36.8 C) (Oral)  Ht 5\' 2"  (1.575 m)  Wt 141 lb 9.6 oz (64.229 kg)  BMI 25.89 kg/m2 NAD. Alert, oriented. Right TM otitis almost resolved. Left TM mild clear effusion, no erythema. Pharynx clear. Neck supple with mild soft adenopathy. Lungs clear. Heart regular rate rhythm. Localized area of extreme tenderness on the left parietal area scalp. Also occasional spontaneous sharp pain. No tenderness noted in the temporal area.  Assessment:Trigeminal neuralgia - Plan: methylPREDNISolone acetate (DEPO-MEDROL) injection 40 mg  Plan: Meds ordered this encounter  Medications  . traMADol (ULTRAM) 50 MG tablet    Sig: Take 1 tablet (50 mg total) by mouth every 6 (six) hours as needed.    Dispense:  20 tablet    Refill:  0    Order Specific Question:  Supervising Provider    Answer:  Merlyn Albert [[id number]]  . predniSONE (DELTASONE) 20 MG tablet    Sig: 3 po qd x 3 d then 2 po qd x 3 d then 1 po qd x 3 d; start 11/27 am    Dispense:  18 tablet    Refill:  0    Order Specific Question:  Supervising Provider    Answer:  Merlyn Albert [[id number]]  . methylPREDNISolone acetate (DEPO-MEDROL) injection 40 mg    Sig:   . metoprolol succinate (TOPROL-XL) 25 MG 24 hr tablet    Sig: TAKE ONE TABLET DAILY.    Dispense:  30 tablet    Refill:  5    Please fill 01/18/13    Order Specific Question:  Supervising Provider    Answer:  Merlyn Albert [[id number]]   Warning signs reviewed. Call back in 48 hours if no improvement,  sooner if worse. Complete Augmentin as directed.

## 2013-01-15 NOTE — Assessment & Plan Note (Signed)
Depo-Medrol 40 mg IM. Start oral prednisone tomorrow. Tramadol as directed for pain. Call back in 48 hours if no improvement, sooner if worse.

## 2013-01-19 ENCOUNTER — Other Ambulatory Visit: Payer: Self-pay | Admitting: Family Medicine

## 2013-01-26 ENCOUNTER — Encounter: Payer: Self-pay | Admitting: Nurse Practitioner

## 2013-01-26 ENCOUNTER — Ambulatory Visit (INDEPENDENT_AMBULATORY_CARE_PROVIDER_SITE_OTHER): Payer: Medicare Other | Admitting: Nurse Practitioner

## 2013-01-26 VITALS — BP 130/90 | Temp 98.6°F | Ht 62.0 in | Wt 137.0 lb

## 2013-01-26 DIAGNOSIS — H7291 Unspecified perforation of tympanic membrane, right ear: Secondary | ICD-10-CM

## 2013-01-26 DIAGNOSIS — H6590 Unspecified nonsuppurative otitis media, unspecified ear: Secondary | ICD-10-CM

## 2013-01-26 DIAGNOSIS — H6591 Unspecified nonsuppurative otitis media, right ear: Secondary | ICD-10-CM

## 2013-01-26 DIAGNOSIS — J069 Acute upper respiratory infection, unspecified: Secondary | ICD-10-CM

## 2013-01-26 MED ORDER — AMOXICILLIN-POT CLAVULANATE 875-125 MG PO TABS: 1.0000 | ORAL_TABLET | Freq: Two times a day (BID) | ORAL | Status: DC

## 2013-01-26 NOTE — Progress Notes (Signed)
   Subjective:    Patient ID: Katherine Lane, female    DOB: [birth year], 79 y.o.   MRN: [id number]  Cough This is a new problem. The current episode started 1 to 4 weeks ago. The problem has been unchanged. The cough is productive of sputum. Associated symptoms include a fever, nasal congestion and postnasal drip. Nothing aggravates the symptoms. She has tried oral steroids for the symptoms. The treatment provided no relief.  Began about 10 days ago. Frequent cough mostly clear sputum. No sinus headache, no ear pain, no sore throat. Slight fever noted last night. Completed prednisone taper for neuralgia; symptoms have resolved. No drainage from right ear. Had a possible right TM rupture in November.    Review of Systems  Constitutional: Positive for fever.  HENT: Positive for postnasal drip.   Respiratory: Positive for cough.        Objective:   Physical Exam NAD. Alert, oriented. Left TM mild clear effusion. Right TM: 3/4 of TM has cloudy effusion; 1/4 around 6-8 o'clock has a depressed area. No drainage. Pharynx injected with slightly green PND noted. Neck supple with mild, soft tender adenopathy. Lungs clear. Heart RRR.       Assessment & Plan:  Acute upper respiratory infections of unspecified site  Plan:  Meds ordered this encounter  Medications  . amoxicillin-clavulanate (AUGMENTIN) 875-125 MG per tablet    Sig: Take 1 tablet by mouth 2 (two) times daily.    Dispense:  20 tablet    Refill:  0    Order Specific Question:  Supervising Provider    Answer:  Merlyn Albert [[id number]]   OTC meds as directed for congestion. Will refer to ENT specialist for further evaluation of right ear.

## 2013-01-26 NOTE — Patient Instructions (Signed)
Mucinex DM

## 2013-03-03 ENCOUNTER — Other Ambulatory Visit: Payer: Self-pay | Admitting: Nurse Practitioner

## 2013-03-24 ENCOUNTER — Ambulatory Visit (INDEPENDENT_AMBULATORY_CARE_PROVIDER_SITE_OTHER): Payer: Medicare Other | Admitting: Otolaryngology

## 2013-04-13 ENCOUNTER — Other Ambulatory Visit: Payer: Self-pay | Admitting: Nurse Practitioner

## 2013-04-20 ENCOUNTER — Other Ambulatory Visit: Payer: Self-pay | Admitting: Nurse Practitioner

## 2013-04-20 ENCOUNTER — Ambulatory Visit (INDEPENDENT_AMBULATORY_CARE_PROVIDER_SITE_OTHER): Payer: Medicare Other | Admitting: Nurse Practitioner

## 2013-04-20 ENCOUNTER — Encounter: Payer: Self-pay | Admitting: Nurse Practitioner

## 2013-04-20 VITALS — BP 136/82 | Ht 62.0 in | Wt 142.0 lb

## 2013-04-20 DIAGNOSIS — IMO0002 Reserved for concepts with insufficient information to code with codable children: Secondary | ICD-10-CM

## 2013-04-20 DIAGNOSIS — M545 Low back pain, unspecified: Secondary | ICD-10-CM

## 2013-04-20 DIAGNOSIS — M810 Age-related osteoporosis without current pathological fracture: Secondary | ICD-10-CM

## 2013-04-20 DIAGNOSIS — K5909 Other constipation: Secondary | ICD-10-CM

## 2013-04-20 MED ORDER — METHOCARBAMOL 500 MG PO TABS: 500.0000 mg | ORAL_TABLET | Freq: Three times a day (TID) | ORAL | Status: DC | PRN

## 2013-04-20 MED ORDER — ALENDRONATE SODIUM 70 MG PO TABS: 70.0000 mg | ORAL_TABLET | ORAL | Status: DC

## 2013-04-20 NOTE — Progress Notes (Signed)
Subjective:  Presents with her daughter for complaints of a flareup of her low back pain for the past month and half. Has a history of bulging discs per MRI in 2005. Has seen Dr. Venetia MaxonStern in the past for injections, this was several years ago. Pain is worse with sitting. Pain will go down the right buttock, occasionally will radiate to the foot. No specific history of injury. Is not currently on her vitamin D and calcium due to complaints of constipation. Has not been on her Fosamax for a long time, osteoporosis was noted on her last bone density study in 2009. Has off-and-on urinary symptoms from frequency to trouble urinating. Has pressure in the vaginal area particularly with bowel movements. Difficulty passing stool even if it soft. Has to physically remove bowel movement at times. No fever. History of hemorrhoids.  Objective:   BP 136/82  Ht 5\' 2"  (1.575 m)  Wt 142 lb (64.411 kg)  BMI 25.97 kg/m2 NAD. Alert, oriented. Lungs clear. Heart regular rate rhythm. Tenderness noted in the lumbar sacral area particularly on the right side into the right mid buttock. Gait slow but steady. Difficulty moving on the table. Bimanual exam minimal relaxation of the anterior vaginal wall, relaxation noted of the posterior vaginal wall but no bulging at this point since no significant amount of stool noted in the colon.  Assessment: Problem List Items Addressed This Visit     Digestive   CONSTIPATION, CHRONIC/possible rectocele      Musculoskeletal and Integument   OSTEOPOROSIS   Relevant Medications      ALENDRONATE SODIUM 70 MG PO TABS   Bulging discs    Other Visit Diagnoses   Low back pain    -  Primary    Relevant Medications       methocarbamol (ROBAXIN) tablet    Other Relevant Orders       MR Lumbar Spine Wo Contrast      Plan: Obtain MRI of the lumbar spine, will refer back to Dr. Venetia MaxonStern for reevaluation. Continue OTC anti-inflammatories as tolerated. Restart vitamin D supplementation. Patient  wishes to restart her generic Fosamax. Meds ordered this encounter  Medications  . alendronate (FOSAMAX) 70 MG tablet    Sig: Take 1 tablet (70 mg total) by mouth every 7 (seven) days. Take with a full glass of water on an empty stomach.    Dispense:  4 tablet    Refill:  11    Order Specific Question:  Supervising Provider    Answer:  Merlyn AlbertLUKING, WILLIAM S [[id number]]  . methocarbamol (ROBAXIN) 500 MG tablet    Sig: Take 1 tablet (500 mg total) by mouth every 8 (eight) hours as needed for muscle spasms.    Dispense:  30 tablet    Refill:  0    Order Specific Question:  Supervising Provider    Answer:  Merlyn AlbertLUKING, WILLIAM S [[id number]]   Patient has difficulty taking narcotic pain relievers. Trial of Robaxin but cautioned about potential drowsiness and confusion. DC med if any problems. Ice/heat to the back area. Patient will think about referral to specialist for her colon issues. Call back sooner if symptoms worsen.

## 2013-04-20 NOTE — Patient Instructions (Signed)
Rectocele Calcium citrate with vitamin D

## 2013-04-26 ENCOUNTER — Ambulatory Visit (HOSPITAL_COMMUNITY)
Admission: RE | Admit: 2013-04-26 | Discharge: 2013-04-26 | Disposition: A | Discharge status: Home / Self Care | Payer: Medicare Other | Source: Ambulatory Visit | Attending: Nurse Practitioner | Admitting: Nurse Practitioner

## 2013-04-26 DIAGNOSIS — M51379 Other intervertebral disc degeneration, lumbosacral region without mention of lumbar back pain or lower extremity pain: Secondary | ICD-10-CM | POA: Insufficient documentation

## 2013-04-26 DIAGNOSIS — M5137 Other intervertebral disc degeneration, lumbosacral region: Secondary | ICD-10-CM | POA: Insufficient documentation

## 2013-04-27 ENCOUNTER — Other Ambulatory Visit: Payer: Self-pay

## 2013-04-27 DIAGNOSIS — M545 Low back pain, unspecified: Secondary | ICD-10-CM

## 2013-05-03 ENCOUNTER — Other Ambulatory Visit: Payer: Self-pay | Admitting: Family Medicine

## 2013-05-03 NOTE — Telephone Encounter (Signed)
Ok plus three ref 

## 2013-05-03 NOTE — Telephone Encounter (Signed)
Seen 04/20/13 

## 2013-05-11 ENCOUNTER — Other Ambulatory Visit: Payer: Self-pay | Admitting: Family Medicine

## 2013-05-11 ENCOUNTER — Encounter (HOSPITAL_COMMUNITY): Payer: Self-pay | Admitting: Emergency Medicine

## 2013-05-11 ENCOUNTER — Emergency Department (HOSPITAL_COMMUNITY)
Admission: EM | Admit: 2013-05-11 | Discharge: 2013-05-11 | Disposition: A | Discharge status: Home / Self Care | Payer: Medicare Other | Attending: Emergency Medicine | Admitting: Emergency Medicine

## 2013-05-11 ENCOUNTER — Encounter: Payer: Self-pay | Admitting: Family Medicine

## 2013-05-11 ENCOUNTER — Emergency Department (HOSPITAL_COMMUNITY): Payer: Medicare Other

## 2013-05-11 ENCOUNTER — Ambulatory Visit (INDEPENDENT_AMBULATORY_CARE_PROVIDER_SITE_OTHER): Payer: Medicare Other | Admitting: Family Medicine

## 2013-05-11 VITALS — BP 148/102 | HR 110 | Temp 98.8°F | Ht 62.0 in | Wt 138.0 lb

## 2013-05-11 DIAGNOSIS — E860 Dehydration: Secondary | ICD-10-CM

## 2013-05-11 DIAGNOSIS — F329 Major depressive disorder, single episode, unspecified: Secondary | ICD-10-CM | POA: Insufficient documentation

## 2013-05-11 DIAGNOSIS — R002 Palpitations: Secondary | ICD-10-CM | POA: Insufficient documentation

## 2013-05-11 DIAGNOSIS — G8929 Other chronic pain: Secondary | ICD-10-CM | POA: Insufficient documentation

## 2013-05-11 DIAGNOSIS — R51 Headache: Secondary | ICD-10-CM | POA: Insufficient documentation

## 2013-05-11 DIAGNOSIS — R000 Tachycardia, unspecified: Secondary | ICD-10-CM

## 2013-05-11 DIAGNOSIS — I498 Other specified cardiac arrhythmias: Secondary | ICD-10-CM | POA: Insufficient documentation

## 2013-05-11 DIAGNOSIS — Z79899 Other long term (current) drug therapy: Secondary | ICD-10-CM | POA: Insufficient documentation

## 2013-05-11 DIAGNOSIS — I4892 Unspecified atrial flutter: Secondary | ICD-10-CM

## 2013-05-11 DIAGNOSIS — F3289 Other specified depressive episodes: Secondary | ICD-10-CM | POA: Insufficient documentation

## 2013-05-11 DIAGNOSIS — Z7982 Long term (current) use of aspirin: Secondary | ICD-10-CM | POA: Insufficient documentation

## 2013-05-11 DIAGNOSIS — R0602 Shortness of breath: Secondary | ICD-10-CM | POA: Insufficient documentation

## 2013-05-11 DIAGNOSIS — Z8719 Personal history of other diseases of the digestive system: Secondary | ICD-10-CM | POA: Insufficient documentation

## 2013-05-11 DIAGNOSIS — M129 Arthropathy, unspecified: Secondary | ICD-10-CM | POA: Insufficient documentation

## 2013-05-11 DIAGNOSIS — G4700 Insomnia, unspecified: Secondary | ICD-10-CM | POA: Insufficient documentation

## 2013-05-11 DIAGNOSIS — R42 Dizziness and giddiness: Secondary | ICD-10-CM | POA: Insufficient documentation

## 2013-05-11 DIAGNOSIS — G43909 Migraine, unspecified, not intractable, without status migrainosus: Secondary | ICD-10-CM | POA: Insufficient documentation

## 2013-05-11 DIAGNOSIS — I10 Essential (primary) hypertension: Secondary | ICD-10-CM | POA: Insufficient documentation

## 2013-05-11 DIAGNOSIS — F411 Generalized anxiety disorder: Secondary | ICD-10-CM | POA: Insufficient documentation

## 2013-05-11 LAB — CBC WITH DIFFERENTIAL/PLATELET
Basophils Absolute: 0 10*3/uL (ref 0.0–0.1)
Basophils Relative: 0 % (ref 0–1)
Eosinophils Absolute: 0 10*3/uL (ref 0.0–0.7)
Eosinophils Relative: 1 % (ref 0–5)
HCT: 43.2 % (ref 36.0–46.0)
Hemoglobin: 14.3 g/dL (ref 12.0–15.0)
Lymphocytes Relative: 34 % (ref 12–46)
Lymphs Abs: 1.8 10*3/uL (ref 0.7–4.0)
MCH: 29.7 pg (ref 26.0–34.0)
MCHC: 33.1 g/dL (ref 30.0–36.0)
MCV: 89.6 fL (ref 78.0–100.0)
Monocytes Absolute: 0.4 10*3/uL (ref 0.1–1.0)
Monocytes Relative: 7 % (ref 3–12)
Neutro Abs: 3.1 10*3/uL (ref 1.7–7.7)
Neutrophils Relative %: 58 % (ref 43–77)
Platelets: 268 10*3/uL (ref 150–400)
RBC: 4.82 MIL/uL (ref 3.87–5.11)
RDW: 13.1 % (ref 11.5–15.5)
WBC: 5.4 10*3/uL (ref 4.0–10.5)

## 2013-05-11 LAB — BASIC METABOLIC PANEL
BUN: 18 mg/dL (ref 6–23)
CO2: 29 mEq/L (ref 19–32)
Calcium: 9.7 mg/dL (ref 8.4–10.5)
Chloride: 101 mEq/L (ref 96–112)
Creatinine, Ser: 1.07 mg/dL (ref 0.50–1.10)
GFR calc Af Amer: 56 mL/min — ABNORMAL LOW (ref >90)
GFR calc non Af Amer: 48 mL/min — ABNORMAL LOW (ref >90)
Glucose, Bld: 146 mg/dL — ABNORMAL HIGH (ref 70–99)
Potassium: 3.7 mEq/L (ref 3.7–5.3)
Sodium: 143 mEq/L (ref 137–147)

## 2013-05-11 LAB — D-DIMER, QUANTITATIVE: D-Dimer, Quant: 0.66 ug/mL-FEU — ABNORMAL HIGH (ref 0.00–0.48)

## 2013-05-11 LAB — PRO B NATRIURETIC PEPTIDE: Pro B Natriuretic peptide (BNP): 229.2 pg/mL (ref 0–450)

## 2013-05-11 LAB — TROPONIN I: Troponin I: <0.3 ng/mL (ref <0.30)

## 2013-05-11 MED ORDER — IOHEXOL 350 MG/ML SOLN
100.0000 mL | Freq: Once | INTRAVENOUS | Status: AC | PRN
  Administered 2013-05-11: 100 mL via INTRAVENOUS

## 2013-05-11 MED ORDER — SODIUM CHLORIDE 0.9 % IV BOLUS (SEPSIS)
1000.0000 mL | Freq: Once | INTRAVENOUS | Status: AC
  Administered 2013-05-11: 1000 mL via INTRAVENOUS

## 2013-05-11 NOTE — Progress Notes (Signed)
   Subjective:    Patient ID: Katherine RicksBetty M Lane, female    DOB: 1932/08/09, 80 y.o.   MRN: [id number]  HPI Comments: Pt also said that she has tingles in her left arm for about a week now. It comes and goes. It is not pain or numbness.  She also said she could hear her heartbeat in her ears this morning.   Hypertension This is a new problem. The current episode started today. Associated symptoms include anxiety and shortness of breath. (Tachycardia) Associated agents: She said she took her HCTZ early this morning, which she said she never does. She only takes it PRN later in the day.   Diuretic this morning  Numbers have been elevated, takes extra to get it back down  Tingling sens in the left arm  Felt short of breath  syst 200  Uses hctz prn   Also took an ativan  Patient didn't go to a funeral this afternoon.  No true chest pain.  Patient feels her heart rate is going extremely fast.   Review of Systems  Respiratory: Positive for shortness of breath.    as noted above.     Objective:   Physical Exam Alert anxious appearing. HEENT normal. Lungs clear. Heart clear tachycardia noted. No chest wall pain neuro exam intact.  EKG heart rate of 1:30 with probable atrial flutter. Superimposed sawtooth wave pattern noted.     Assessment & Plan:  Impression acute onset atrial flutter long discussion held with patient and family. Increased risk of stroke. Increased risk of even heart attack or heart failure with with this condition. Urgent attention needed. Rationale discussed with patient. Plan sent to ER. We spoke with both the triage nurse and the ER physician. Easily 35-40 minutes spent with patient. WSL

## 2013-05-11 NOTE — ED Notes (Signed)
EKG completed on arrival and repeated.

## 2013-05-11 NOTE — ED Provider Notes (Signed)
CSN: [id number]     Arrival date & time 05/11/13  1221 History  This chart was scribed for Shon Batonourtney F Yuki Brunsman, MD by Valera CastleSteven Perry, ED Scribe. This patient was seen in room APA09/APA09 and the patient's care was started at 1:06 PM.   Chief Complaint  Patient presents with  . Tachycardia  . Dizziness   (Consider location/radiation/quality/duration/timing/severity/associated sxs/prior Treatment) The history is provided by the patient. No language interpreter was used.   HPI Comments: Katherine Lane is a 80 y.o. female who presents to the Emergency Department complaining of palpitations, onset last night, recurrent this morning, with associated dizziness. She reports feeling imbalanced with her dizziness versus anything spinning around. She reports thinking her symptoms were related to gas at first, but became worried when her symptoms persisted. Pt's relative states pt's BP has been running close to the 200s at times recently. She reports pt had high BP this morning after pt's symptoms of palpitations and dizziness. Pt denies changes in her BP medication. She reports associated tingling in her left upper arm/shoulder. She reports reaching up with her arm exacerbates her tingling sensation. She denies extremity weakness, chest pain, LE swelling, fever, cough, diarrhea, abdominal pain, and any other associated symptoms. She reports h/o osteoporosis, HTN. She denies h/o smoking, heart failure.   PCP - Harlow AsaLUKING,W S, MD  Past Medical History  Diagnosis Date  . HTN (hypertension)   . Osteoporosis   . Constipation, chronic   . PONV (postoperative nausea and vomiting)   . Arthritis   . Migraine headache   . Chronic back pain   . Depression   . Anxiety   . Insomnia    Past Surgical History  Procedure Laterality Date  . Partial hysterectomy    . Ankle fracture surgery      2010-right  . Nerve repair  07/18/2011    Procedure: NERVE REPAIR;  Surgeon: Vickki HearingStanley E Harrison, MD;  Location: [facility];   Service: Orthopedics;  Laterality: Right;  Right leg superficial peroneal nerve release    . Hardware removal  07/18/2011    Procedure: HARDWARE REMOVAL;  Surgeon: Vickki HearingStanley E Harrison, MD;  Location: [facility];  Service: Orthopedics;  Laterality: Right;  . Colonoscopy    . Cataract extraction w/phaco Right 07/22/2012    Procedure: CATARACT EXTRACTION PHACO AND INTRAOCULAR LENS PLACEMENT (IOC);  Surgeon: Gemma PayorKerry Hunt, MD;  Location: [facility];  Service: Ophthalmology;  Laterality: Right;  CDE:  18.93  . Cataract extraction w/phaco Left 08/09/2012    Procedure: CATARACT EXTRACTION PHACO AND INTRAOCULAR LENS PLACEMENT (IOC);  Surgeon: Gemma PayorKerry Hunt, MD;  Location: [facility];  Service: Ophthalmology;  Laterality: Left;  CDE: 17.21   Family History  Problem Relation Age of Onset  . Alzheimer's disease    . Cancer    . Anesthesia problems Neg Hx   . Hypotension Neg Hx   . Malignant hyperthermia Neg Hx   . Pseudochol deficiency Neg Hx   . Hypertension Mother   . Hypertension Father    History  Substance Use Topics  . Smoking status: Never Smoker   . Smokeless tobacco: Never Used  . Alcohol Use: No   OB History   Grav Para Term Preterm Abortions TAB SAB Ect Mult Living                 Review of Systems  Constitutional: Negative for fever.  Respiratory: Positive for shortness of breath. Negative for cough and chest tightness.   Cardiovascular: Positive for palpitations. Negative  for chest pain and leg swelling.  Gastrointestinal: Negative for nausea, vomiting and abdominal pain.  Genitourinary: Negative for dysuria.  Musculoskeletal: Negative for back pain.  Skin: Negative for wound.  Neurological: Positive for light-headedness. Negative for dizziness, weakness and headaches.  Psychiatric/Behavioral: Negative for confusion.  All other systems reviewed and are negative.   Allergies  Codeine  Home Medications   Current Outpatient Rx  Name  Route  Sig  Dispense  Refill  . aspirin EC 81 MG  tablet   Oral   Take 81 mg by mouth every morning.          . Calcium Carbonate-Vitamin D (CALCIUM 500 + D PO)   Oral   Take 1 tablet by mouth every morning.          . clobetasol (TEMOVATE) 0.05 % external solution   Topical   Apply 1 application topically every 30 (thirty) days.          . hydrochlorothiazide (HYDRODIURIL) 25 MG tablet      TAKE (1) TABLET BY MOUTH EACH MORNING.   30 tablet   2   . LORazepam (ATIVAN) 0.5 MG tablet      TAKE (1) TABLET DAILY AS NEEDED.   30 tablet   3   . methocarbamol (ROBAXIN) 500 MG tablet   Oral   Take 1 tablet (500 mg total) by mouth every 8 (eight) hours as needed for muscle spasms.   30 tablet   0   . metoprolol succinate (TOPROL-XL) 25 MG 24 hr tablet      TAKE ONE TABLET DAILY.   30 tablet   5     Please fill 01/18/13   . verapamil (CALAN-SR) 240 MG CR tablet      TAKE (1) TABLET BY MOUTH EACH MORNING.   30 tablet   5   . zolpidem (AMBIEN) 10 MG tablet      TAKE 1 TABLET BY MOUTH AT BEDTIME FOR SLEEP.   30 tablet   5   . enalapril (VASOTEC) 20 MG tablet      TAKE ONE TABLET BY MOUTH IN THE EVENING.   30 tablet   5    BP 178/103  Pulse 106  Temp(Src) 97.9 F (36.6 C) (Oral)  Resp 18  Ht 5' 2.5" (1.588 m)  Wt 138 lb (62.596 kg)  BMI 24.82 kg/m2  SpO2 95%  Physical Exam  Nursing note and vitals reviewed. Constitutional: She is oriented to person, place, and time. No distress.  Elderly  HENT:  Head: Normocephalic and atraumatic.  Mouth/Throat: Oropharynx is clear and moist.  Eyes: Pupils are equal, round, and reactive to light.  Neck: Neck supple.  Cardiovascular: Regular rhythm and normal heart sounds.   No murmur heard. Tachycardia  Pulmonary/Chest: Effort normal. No respiratory distress. She has no wheezes.  Abdominal: Soft. Bowel sounds are normal. There is no tenderness. There is no rebound.  Musculoskeletal: She exhibits no edema.  Neurological: She is alert and oriented to person,  place, and time.  Skin: Skin is warm and dry.  Psychiatric: She has a normal mood and affect.    ED Course  Procedures (including critical care time)  DIAGNOSTIC STUDIES: Oxygen Saturation is 95% on room air, normal by my interpretation.    COORDINATION OF CARE: 1:13 PM-Discussed treatment plan which includes EKG, CXR, and blood work with pt at bedside and pt agreed to plan.   Results for orders placed during the hospital encounter of 05/11/13  CBC  WITH DIFFERENTIAL      Result Value Ref Range   WBC 5.4  4.0 - 10.5 K/uL   RBC 4.82  3.87 - 5.11 MIL/uL   Hemoglobin 14.3  12.0 - 15.0 g/dL   HCT 16.1  09.6 - 04.5 %   MCV 89.6  78.0 - 100.0 fL   MCH 29.7  26.0 - 34.0 pg   MCHC 33.1  30.0 - 36.0 g/dL   RDW 40.9  81.1 - 91.4 %   Platelets 268  150 - 400 K/uL   Neutrophils Relative % 58  43 - 77 %   Neutro Abs 3.1  1.7 - 7.7 K/uL   Lymphocytes Relative 34  12 - 46 %   Lymphs Abs 1.8  0.7 - 4.0 K/uL   Monocytes Relative 7  3 - 12 %   Monocytes Absolute 0.4  0.1 - 1.0 K/uL   Eosinophils Relative 1  0 - 5 %   Eosinophils Absolute 0.0  0.0 - 0.7 K/uL   Basophils Relative 0  0 - 1 %   Basophils Absolute 0.0  0.0 - 0.1 K/uL  BASIC METABOLIC PANEL      Result Value Ref Range   Sodium 143  137 - 147 mEq/L   Potassium 3.7  3.7 - 5.3 mEq/L   Chloride 101  96 - 112 mEq/L   CO2 29  19 - 32 mEq/L   Glucose, Bld 146 (*) 70 - 99 mg/dL   BUN 18  6 - 23 mg/dL   Creatinine, Ser 7.82  0.50 - 1.10 mg/dL   Calcium 9.7  8.4 - 95.6 mg/dL   GFR calc non Af Amer 48 (*) >90 mL/min   GFR calc Af Amer 56 (*) >90 mL/min  TROPONIN I      Result Value Ref Range   Troponin I <0.30  <0.30 ng/mL  PRO B NATRIURETIC PEPTIDE      Result Value Ref Range   Pro B Natriuretic peptide (BNP) 229.2  0 - 450 pg/mL  D-DIMER, QUANTITATIVE      Result Value Ref Range   D-Dimer, Quant 0.66 (*) 0.00 - 0.48 ug/mL-FEU   Mr Lumbar Spine Wo Contrast  04/26/2013   CLINICAL DATA:  Low back pain extending into the right  lower extremity.  EXAM: MRI LUMBAR SPINE WITHOUT CONTRAST  TECHNIQUE: Multiplanar, multisequence MR imaging was performed. No intravenous contrast was administered.  COMPARISON:  MRI of the lumbar spine 10/10/2003.  FINDINGS: Normal signal is present in the conus medullaris which terminates at L1-2, within normal limits. Rightward curvature of the lumbar spine is centered at L3. Asymmetric chronic endplate marrow changes are noted on the left at L3-4. There is slight retrolisthesis at L2-3. A remote superior endplate fracture is present at T12. Vertebral body heights and alignment are otherwise normal. Limited imaging of the abdomen is unremarkable.  L1-2:  Mild disc bulging is present without significant change.  L2-3: A mild broad-based disc bulge and facet hypertrophy is present. There is no significant stenosis or change.  L3-4: Mild leftward disc bulging is present. Mild facet hypertrophy is noted as well. This results in slight progression of mild left lateral recess and foraminal stenosis.  L4-5: Mild rightward disc bulging and facet hypertrophy has progressed.  L5-S1: Mild facet hypertrophy has progressed. There is no significant stenosis.  IMPRESSION: 1. Mild leftward disc bulging and facet hypertrophy results in slight progression of mild left lateral recess and foraminal narrowing at L3-4. 2. Mild right  lateral recess and foraminal stenosis has progressed slightly since prior exam as well. 3. Slight progression of facet hypertrophy at L5-S1. There is no focal protrusion or stenosis.   Electronically Signed   By: Gennette Pac M.D.   On: 04/26/2013 10:56   Dg Chest Portable 1 View  05/11/2013   CLINICAL DATA:  Tachycardia.  Dizziness.  EXAM: PORTABLE CHEST - 1 VIEW  COMPARISON:  02/15/2009  FINDINGS: Stable right lower lung zone calcification. Lungs are otherwise clear. Cardiac silhouette is normal in size. The aorta is uncoiled. No mediastinal or hilar masses.  Bony thorax is demineralized but grossly  intact.  IMPRESSION: No acute cardiopulmonary disease.   Electronically Signed   By: Amie Portland M.D.   On: 05/11/2013 13:09   EKG independently reviewed by myself: Sinus tachycardia with a rate of 108, no evidence of acute ST elevation or ischemia  EKG Interpretation None     Medications  sodium chloride 0.9 % bolus 1,000 mL (0 mLs Intravenous Stopped 05/11/13 1600)  iohexol (OMNIPAQUE) 350 MG/ML injection 100 mL (100 mLs Intravenous Contrast Given 05/11/13 1652)   MDM   Final diagnoses:  Tachycardia  Dehydration   Patient presents with palpitations and shortness of breath. She is nontoxic-appearing on exam. Initial heart rate 110.  EKG shows sinus tachycardia. I reviewed EKG from patient's PCP which also show sinus tachycardia. Workup initiated. Troponin is negative the patient has not had any chest pain. Chest x-ray shows no evidence of cardiopulmonary disease. D-dimer is mildly elevated. CTA negative for PE. Labs are suggestive of mild dehydration with elevated creatinine. Patient also noted to be orthostatic. Patient was given fluids and reports improvement. Max heart rate in the ER one 10. Heart rate mostly in the 70s to 80s. No evidence of arrhythmia on monitoring. Discuss with patient followup with cardiology for possible Holter monitor. Patient stated understanding. Patient might be discharged home. She has close primary care followup and  will followup with cardiology as instructed. Patient was given strict return precautions  After history, exam, and medical workup I feel the patient has been appropriately medically screened and is safe for discharge home. Pertinent diagnoses were discussed with the patient. Patient was given return precautions.  I personally performed the services described in this documentation, which was scribed in my presence. The recorded information has been reviewed and is accurate.    Shon Baton, MD 05/11/13 2256

## 2013-05-11 NOTE — ED Notes (Signed)
Pt c/o feeling light headed and heart racing for the past few nights.  Went to Dr. Fletcher AnonLuking's office and was sent here for evaluation of atrial flutter.

## 2013-05-11 NOTE — Discharge Instructions (Signed)
Dehydration, Elderly Dehydration means your body does not have as much fluid as it needs. Your kidneys, brain, and heart will not work properly without the right amount of fluids and salt. Older adults are more likely to become dehydrated than younger adults. This is because:   Their bodies do not hold water as well.  Their bodies do not respond to temperature changes as well.  They do not get thirsty as easily or as quickly. HOME CARE  Ask your doctor how to replace body fluid losses (rehydrate).  Drink enough fluids to keep your pee (urine) clear or pale yellow.  Drink small amounts of fluids often if you feel sick to your stomach (nauseous) or throw up (vomit).  Eat like you normally do.  Avoid:  Foods or drinks high in sugar.  Bubbly (carbonated) drinks.  Juice.  Very hot or cold fluids.  Drinks with caffeine.  Fatty, greasy foods.  Alcohol.  Tobacco.  Eating too much.  Gelatin desserts.  Wash your hands to avoid spreading germs (bacteria, viruses).  Only take medicine as told by your doctor.  Keep all doctor visits as told. GET HELP IF:  You have belly (abdominal) pain that gets worse or stays in one spot (localizes).  You have a rash, stiff neck, or bad headache.  You get easily annoyed, sleepy, or are hard to wake up.  You feel weak, dizzy, or very thirsty. GET HELP RIGHT AWAY IF:   You cannot drink fluid without throwing up.  You get worse even with treatment.  You throw up often.  You have watery poop (diarrhea) often.  Your vomit has blood in it or looks greenish.  Your poop (stool) has blood in it or looks black and tarry.  You have not peed in 6 to 8 hours or have only peed a small amount of very dark pee.  You have a fever.  You pass out (faint). MAKE SURE YOU:   Understand these instructions.  Will watch your condition.  Will get help right away if you are not doing well or get worse. Document Released: 01/23/2011 Document  Revised: 11/24/2012 Document Reviewed: 10/11/2012 [facility] Patient Information 2014 [place]. Nonspecific Tachycardia Tachycardia is a faster than normal heartbeat (more than 100 beats per minute). In adults, the heart normally beats between 60 and 100 times a minute. A fast heartbeat may be a normal response to exercise or stress. It does not necessarily mean that something is wrong. However, sometimes when your heart beats too fast it may not be able to pump enough blood to the rest of your body. This can result in chest pain, shortness of breath, dizziness, and even fainting. Nonspecific tachycardia means that the specific cause or pattern of your tachycardia is unknown. CAUSES  Tachycardia may be harmless or it may be due to a more serious underlying cause. Possible causes of tachycardia include:  Exercise or exertion.  Fever.  Pain or injury.  Infection.  Loss of body fluids (dehydration).  Overactive thyroid.  Lack of red blood cells (anemia).  Anxiety and stress.  Alcohol.  Caffeine.  Tobacco products.  Diet pills.  Illegal drugs.  Heart disease. SYMPTOMS  Rapid or irregular heartbeat (palpitations).  Suddenly feeling your heart beating (cardiac awareness).  Dizziness.  Tiredness (fatigue).  Shortness of breath.  Chest pain.  Nausea.  Fainting. DIAGNOSIS  Your caregiver will perform a physical exam and take your medical history. In some cases, a heart specialist (cardiologist) may be consulted. Your caregiver  may also order:  Blood tests.  Electrocardiography. This test records the electrical activity of your heart.  A heart monitoring test. TREATMENT  Treatment will depend on the likely cause of your tachycardia. The goal is to treat the underlying cause of your tachycardia. Treatment methods may include:  Replacement of fluids or blood through an intravenous (IV) tube for moderate to severe dehydration or anemia.  New medicines or  changes in your current medicines.  Diet and lifestyle changes.  Treatment for certain infections.  Stress relief or relaxation methods. HOME CARE INSTRUCTIONS   Rest.  Drink enough fluids to keep your urine clear or pale yellow.  Do not smoke.  Avoid:  Caffeine.  Tobacco.  Alcohol.  Chocolate.  Stimulants such as over-the-counter diet pills or pills that help you stay awake.  Situations that cause anxiety or stress.  Illegal drugs such as marijuana, phencyclidine (PCP), and cocaine.  Only take medicine as directed by your caregiver.  Keep all follow-up appointments as directed by your caregiver. SEEK IMMEDIATE MEDICAL CARE IF:   You have pain in your chest, upper arms, jaw, or neck.  You become weak, dizzy, or feel faint.  You have palpitations that will not go away.  You vomit, have diarrhea, or pass blood in your stool.  Your skin is cool, pale, and wet.  You have a fever that will not go away with rest, fluids, and medicine. MAKE SURE YOU:   Understand these instructions.  Will watch your condition.  Will get help right away if you are not doing well or get worse. Document Released: 03/13/2004 Document Revised: 04/28/2011 Document Reviewed: 01/14/2011 [facility] Patient Information 2014 [place], [place].

## 2013-05-11 NOTE — ED Notes (Signed)
Pt assisted to bathroom. Denies dizziness, CP or SOB.

## 2013-05-11 NOTE — Patient Instructions (Signed)
Atrial flutter with poor heart rate control--risk factor for heart attack and stroke, urgent to get this stabiized

## 2013-05-12 DIAGNOSIS — I4892 Unspecified atrial flutter: Secondary | ICD-10-CM | POA: Insufficient documentation

## 2013-05-17 ENCOUNTER — Ambulatory Visit (INDEPENDENT_AMBULATORY_CARE_PROVIDER_SITE_OTHER): Payer: Medicare Other | Admitting: Family Medicine

## 2013-05-17 ENCOUNTER — Encounter: Payer: Self-pay | Admitting: Family Medicine

## 2013-05-17 VITALS — BP 132/80 | Ht 62.5 in | Wt 137.0 lb

## 2013-05-17 DIAGNOSIS — R000 Tachycardia, unspecified: Secondary | ICD-10-CM

## 2013-05-17 MED ORDER — METOPROLOL SUCCINATE ER 25 MG PO TB24: ORAL_TABLET | ORAL | Status: DC

## 2013-05-17 NOTE — Progress Notes (Signed)
   Subjective:    Patient ID: Katherine Lane, female    DOB: Mar 16, 1932, 80 y.o.   MRN: [id number]  HPIFollow up from ED. Patient states no other concerns today.    Please see prior note present with very rapid heart rate. Was felt to be and potentially atrial flutter. With since emerging. All ER records are reviewed at length today. Had elevated d-dimer so therefore had a CT scan with contrast which was negative. Had negative cardiac enzyme. Patient was given IV fluids for "dehydration". Patient's heart rate did come down to one point. ER doctor felt that it was just sinus tachycardia. I felt that presenting EKG was potential for slow atrial flutter. There did appear be one dropped beat in one EKG which supported this.  Patient has reported 1 persistent fast run of heart rate since then. Does get somewhat lightheaded with this. No chest pain.  Long-standing history of hypertension compliant with medication.  Patient spoke with Dr. Dietrich Patesothbart even know he is retired. He encourage her to see a new cardiologist Dr. Darliss RidgelSuresh  [phone] can leave a message  Review of Systems No current chest pain no shortness breath no abdominal pain ongoing chronic stress. Widowed last year ROS otherwise negative    Objective:   Physical Exam  Alert HEENT normal. Lungs clear. Heart regular in rhythm currently today blood pressure good ankles without edema  Old EKGs reviewed.    Assessment & Plan:  Impression atrophic versus sinus tach. #2 EKG at high straight also showed significant anterolateral ST depression. #3 recent presentation to the ER with the ER doctor recommending cardiologist. Plan long discussion held. We'll press on with l Dr. Caryn SectionSuresh K. cardiology

## 2013-05-18 ENCOUNTER — Other Ambulatory Visit: Payer: Self-pay | Admitting: *Deleted

## 2013-05-18 ENCOUNTER — Encounter: Payer: Self-pay | Admitting: Family Medicine

## 2013-05-18 MED ORDER — METOPROLOL SUCCINATE ER 25 MG PO TB24: ORAL_TABLET | ORAL | Status: DC

## 2013-05-24 ENCOUNTER — Telehealth: Payer: Self-pay | Admitting: Family Medicine

## 2013-05-24 NOTE — Telephone Encounter (Signed)
[employer] DENIED pt's METHOCARBAMOL, please see denial on paper chart, please advise

## 2013-05-24 NOTE — Telephone Encounter (Signed)
ERROR-ignore documentation about 90 day supply below, wrong pt  This is correct for her Rx denial (sorry)

## 2013-05-24 NOTE — Telephone Encounter (Signed)
ok 

## 2013-05-24 NOTE — Telephone Encounter (Signed)
Pt needs 90 day supply when ordered, will also need 90 day Rx for her Glipizide

## 2013-05-30 ENCOUNTER — Ambulatory Visit (INDEPENDENT_AMBULATORY_CARE_PROVIDER_SITE_OTHER): Payer: Medicare Other | Admitting: Cardiovascular Disease

## 2013-05-30 ENCOUNTER — Ambulatory Visit (HOSPITAL_COMMUNITY)
Admission: RE | Admit: 2013-05-30 | Discharge: 2013-05-30 | Disposition: A | Discharge status: Home / Self Care | Payer: Medicare Other | Source: Ambulatory Visit | Attending: Cardiovascular Disease | Admitting: Cardiovascular Disease

## 2013-05-30 ENCOUNTER — Encounter: Payer: Self-pay | Admitting: Cardiovascular Disease

## 2013-05-30 VITALS — BP 170/78 | HR 60 | Ht 62.0 in | Wt 140.0 lb

## 2013-05-30 DIAGNOSIS — R42 Dizziness and giddiness: Secondary | ICD-10-CM

## 2013-05-30 DIAGNOSIS — R0609 Other forms of dyspnea: Secondary | ICD-10-CM | POA: Insufficient documentation

## 2013-05-30 DIAGNOSIS — I10 Essential (primary) hypertension: Secondary | ICD-10-CM | POA: Insufficient documentation

## 2013-05-30 DIAGNOSIS — I359 Nonrheumatic aortic valve disorder, unspecified: Secondary | ICD-10-CM

## 2013-05-30 DIAGNOSIS — I4892 Unspecified atrial flutter: Secondary | ICD-10-CM

## 2013-05-30 DIAGNOSIS — R0989 Other specified symptoms and signs involving the circulatory and respiratory systems: Secondary | ICD-10-CM | POA: Insufficient documentation

## 2013-05-30 DIAGNOSIS — R0602 Shortness of breath: Secondary | ICD-10-CM

## 2013-05-30 NOTE — Progress Notes (Signed)
Patient ID: Katherine RicksBetty M Lane, female   DOB: Jul 28, 1932, 80 y.o.   MRN: [id number]       CARDIOLOGY CONSULT NOTE  Patient ID: Katherine RicksBetty M Seder MRN: [id number] DOB/AGE: Jul 28, 1932 80 y.o.  Admit date: (Not on file) Primary Physician Harlow AsaLUKING,W S, MD  Reason for Consultation: palpitations, abnormal ECG  HPI: The patient is a 80 year old woman who is referred by Dr. Gerda DissLuking for the evaluation and treatment of palpitations. She presented to the emergency room on March 25 with a chief complaint of dizziness and shortness of breath. There was reportedly no chest pain at that time. I reviewed her workup from the emergency room. An ECG was deemed to show sinus tachycardia, 108 beats per minute. A CT angiogram of the chest was performed (mildly elevated D-dimer of 0.66 as well) which showed no evidence of pulmonary embolism. She was also deemed to be orthostatic (BUN 18, creatinine 1.07) and was given IV fluids and discharged. She followed up with Dr. Gerda DissLuking approximately one week later, and he felt that her tachycardia was possibly due to atrial flutter. I reviewed the road and ECGs. I agree that her ECG from March 25 shows a supra-ventricular tachycardia with a heart rate of 108 beats per minute, and a nonspecific ST segment and T wave abnormality. While this could represent sinus tachycardia, there do appear to be some undulating flutter waves and atrial flutter cannot entirely be excluded. She said that 2 days after her ER visit, she had one episode of palpitations with dizziness that lasted approximately an hour and spontaneously resolved. She tells me that another ECG was performed at her PCPs office with a heart rate of 138 beats per minute, but this is not available to me at this time. She currently denies chest pain, shortness of breath, palpitations, lightheadedness, leg swelling and syncope. She denies a history of congestive heart failure, diabetes, stroke and MI.    Allergies  Allergen Reactions   . Codeine Nausea And Vomiting    Patient states intolerance to all pain medications    Current Outpatient Prescriptions  Medication Sig Dispense Refill  . aspirin EC 81 MG tablet Take 81 mg by mouth every morning.       . Calcium Carbonate-Vitamin D (CALCIUM 500 + D PO) Take 1 tablet by mouth every morning.       . clobetasol (TEMOVATE) 0.05 % external solution Apply 1 application topically every 30 (thirty) days.       . enalapril (VASOTEC) 20 MG tablet TAKE ONE TABLET BY MOUTH IN THE EVENING.  30 tablet  5  . hydrochlorothiazide (HYDRODIURIL) 25 MG tablet TAKE (1) TABLET BY MOUTH EACH MORNING.  30 tablet  2  . LORazepam (ATIVAN) 0.5 MG tablet TAKE (1) TABLET DAILY AS NEEDED.  30 tablet  3  . metoprolol succinate (TOPROL-XL) 25 MG 24 hr tablet TAKE ONE AND A HALF TABLETS DAILY.  45 tablet  5  . verapamil (CALAN-SR) 240 MG CR tablet TAKE (1) TABLET BY MOUTH EACH MORNING.  30 tablet  5  . zolpidem (AMBIEN) 10 MG tablet TAKE 1 TABLET BY MOUTH AT BEDTIME FOR SLEEP.  30 tablet  5   No current facility-administered medications for this visit.    Past Medical History  Diagnosis Date  . HTN (hypertension)   . Osteoporosis   . Constipation, chronic   . PONV (postoperative nausea and vomiting)   . Arthritis   . Migraine headache   . Chronic back pain   .  Depression   . Anxiety   . Insomnia     Past Surgical History  Procedure Laterality Date  . Partial hysterectomy    . Ankle fracture surgery      2010-right  . Nerve repair  07/18/2011    Procedure: NERVE REPAIR;  Surgeon: Vickki Hearing, MD;  Location: [facility];  Service: Orthopedics;  Laterality: Right;  Right leg superficial peroneal nerve release    . Hardware removal  07/18/2011    Procedure: HARDWARE REMOVAL;  Surgeon: Vickki Hearing, MD;  Location: [facility];  Service: Orthopedics;  Laterality: Right;  . Colonoscopy    . Cataract extraction w/phaco Right 07/22/2012    Procedure: CATARACT EXTRACTION PHACO AND INTRAOCULAR  LENS PLACEMENT (IOC);  Surgeon: Gemma Payor, MD;  Location: [facility];  Service: Ophthalmology;  Laterality: Right;  CDE:  18.93  . Cataract extraction w/phaco Left 08/09/2012    Procedure: CATARACT EXTRACTION PHACO AND INTRAOCULAR LENS PLACEMENT (IOC);  Surgeon: Gemma Payor, MD;  Location: [facility];  Service: Ophthalmology;  Laterality: Left;  CDE: 17.21    History   Social History  . Marital Status: Widowed    Spouse Name: N/A    Number of Children: N/A  . Years of Education: N/A   Occupational History  . retired    Social History Main Topics  . Smoking status: Never Smoker   . Smokeless tobacco: Never Used  . Alcohol Use: No  . Drug Use: No  . Sexual Activity: Yes    Birth Control/ Protection: Surgical   Other Topics Concern  . Not on file   Social History Narrative  . No narrative on file     No family history of premature CAD in 1st degree relatives.  Prior to Admission medications   Medication Sig Start Date End Date Taking? Authorizing Provider  aspirin EC 81 MG tablet Take 81 mg by mouth every morning.    Yes Historical Provider, MD  Calcium Carbonate-Vitamin D (CALCIUM 500 + D PO) Take 1 tablet by mouth every morning.    Yes Historical Provider, MD  clobetasol (TEMOVATE) 0.05 % external solution Apply 1 application topically every 30 (thirty) days.    Yes Historical Provider, MD  enalapril (VASOTEC) 20 MG tablet TAKE ONE TABLET BY MOUTH IN THE EVENING. 05/11/13  Yes Merlyn Albert, MD  hydrochlorothiazide (HYDRODIURIL) 25 MG tablet TAKE (1) TABLET BY MOUTH EACH MORNING. 01/19/13  Yes Merlyn Albert, MD  LORazepam (ATIVAN) 0.5 MG tablet TAKE (1) TABLET DAILY AS NEEDED. 05/03/13  Yes Merlyn Albert, MD  metoprolol succinate (TOPROL-XL) 25 MG 24 hr tablet TAKE ONE AND A HALF TABLETS DAILY. 05/18/13  Yes Merlyn Albert, MD  verapamil (CALAN-SR) 240 MG CR tablet TAKE (1) TABLET BY MOUTH EACH MORNING. 03/03/13  Yes Campbell Riches, NP  zolpidem (AMBIEN) 10 MG tablet TAKE 1  TABLET BY MOUTH AT BEDTIME FOR SLEEP. 03/03/13  Yes Campbell Riches, NP     Review of systems complete and found to be negative unless listed above in HPI     Physical exam Blood pressure 170/78, pulse 60, height 5\' 2"  (1.575 m), weight 140 lb (63.504 kg). General: NAD Neck: No JVD, no thyromegaly or thyroid nodule.  Lungs: Clear to auscultation bilaterally with normal respiratory effort. CV: Nondisplaced PMI.  Heart mostly regular with periodic premature contractions, normal S1/S2, no S3/S4, I/VI soft systolic murmur along left sternal border.  No peripheral edema.  No carotid bruit.  Normal pedal  pulses.  Abdomen: Soft, nontender, no hepatosplenomegaly, no distention.  Skin: Intact without lesions or rashes.  Neurologic: Alert and oriented x 3.  Psych: Normal affect. Extremities: No clubbing or cyanosis.  HEENT: Normal.   Labs:   Lab Results  Component Value Date   WBC 5.4 05/11/2013   HGB 14.3 05/11/2013   HCT 43.2 05/11/2013   MCV 89.6 05/11/2013   PLT 268 05/11/2013   No results found for this basename: NA, K, CL, CO2, BUN, CREATININE, CALCIUM, LABALBU, PROT, BILITOT, ALKPHOS, ALT, AST, GLUCOSE,  in the last 168 hours Lab Results  Component Value Date   CKTOTAL 65 01/22/2008   CKMB 2.3 01/22/2008   TROPONINI <0.30 05/11/2013    Lab Results  Component Value Date   CHOL 159 07/14/2012   CHOL  Value: 161        ATP III CLASSIFICATION:  <200     mg/dL   Desirable  [id number]  mg/dL   Borderline High  >=301>=240    mg/dL   High 60/1/093212/06/2007   Lab Results  Component Value Date   HDL 52 07/14/2012   HDL 40 35/5/732212/06/2007   Lab Results  Component Value Date   LDLCALC 85 07/14/2012   LDLCALC  Value: 103        Total Cholesterol/HDL:CHD Risk Coronary Heart Disease Risk Table                     Men   Women  1/2 Average Risk   3.4   3.3* 01/22/2008   Lab Results  Component Value Date   TRIG 108 07/14/2012   TRIG 89 01/22/2008   Lab Results  Component Value Date   CHOLHDL 3.1 07/14/2012    CHOLHDL 4.0 01/22/2008   No results found for this basename: LDLDIRECT         Studies: See HPI  ASSESSMENT AND PLAN:  1. Paroxysmal atrial flutter: I have recommended that she wear a 28 day event monitor to see what her burden of atrial flutter and/or atrial fibrillation actually is. Her CHADS-Vasc score is 4 (HTN, age, gender), giving her a high adjusted annual ischemic stroke rate. I did inform her that if in fact she has recurrent atrial flutter, anticoagulation for the prevention of stroke would be recommended. She does not appear to be very keen on this. I will also obtain an echocardiogram to evaluate for structural heart disease. Her resting heart rate is currently 60 beats per minute, and she is taking both verapamil and metoprolol. I will not make any adjustments at this time. 2. Hypertension: Elevated today at 170/78. I will monitor this.  Dispo: f/u 6 weeks.  Signed: Prentice DockerSuresh Koneswaran, M.D., F.A.C.C.  05/30/2013, 8:47 AM

## 2013-05-30 NOTE — Progress Notes (Signed)
*  PRELIMINARY RESULTS* Echocardiogram 2D Echocardiogram has been performed.  Katherine Lane 05/30/2013, 11:34 AM

## 2013-05-30 NOTE — Patient Instructions (Addendum)
Your physician recommends that you schedule a follow-up appointment in: 6 weeks   Your physician recommends that you continue on your current medications as directed. Please refer to the Current Medication list given to you today.   Your physician has requested that you have an echocardiogram. Echocardiography is a painless test that uses sound waves to create images of your heart. It provides your doctor with information about the size and shape of your heart and how well your heart's chambers and valves are working. This procedure takes approximately one hour. There are no restrictions for this procedure.   Your physician has recommended that you wear an event monitor for 28 days. Event monitors are medical devices that record the heart's electrical activity. Doctors most often us these monitors to diagnose arrhythmias. Arrhythmias are problems with the speed or rhythm of the heartbeat. The monitor is a small, portable device. You can wear one while you do your normal daily activities. This is usually used to diagnose what is causing palpitations/syncope (passing out).  Thank you for choosing [facility] !

## 2013-05-31 NOTE — Telephone Encounter (Signed)
error 

## 2013-06-01 ENCOUNTER — Encounter: Payer: Self-pay | Admitting: Family Medicine

## 2013-06-02 ENCOUNTER — Telehealth: Payer: Self-pay

## 2013-06-02 NOTE — Telephone Encounter (Signed)
Patient called back to say she will call [employer] today to schedule delivery of event monitor

## 2013-06-02 NOTE — Telephone Encounter (Signed)
[employer] has faxed final notice that they are unable to reach pt to verify home address to mail monitor. I attempted to reach pt and got answering machine.I gave her toll free number to call [employer] address verification phone [phone] ext [phone] or to call us and let us know if she no longer wants to wear monitor

## 2013-06-06 DIAGNOSIS — I4892 Unspecified atrial flutter: Secondary | ICD-10-CM

## 2013-06-07 ENCOUNTER — Telehealth: Payer: Self-pay | Admitting: *Deleted

## 2013-06-07 NOTE — Telephone Encounter (Signed)
Pt calling about the monitor she is wear it is flashing blue light but the screen is not saying anything and when she navigates from side to side the screen still does not show anything. She is just making sure that she does not need to do anything. She has tried to call company three time and get put on hold for a long time/tmj

## 2013-07-14 ENCOUNTER — Ambulatory Visit (INDEPENDENT_AMBULATORY_CARE_PROVIDER_SITE_OTHER): Payer: Medicare Other | Admitting: Cardiovascular Disease

## 2013-07-14 ENCOUNTER — Other Ambulatory Visit: Payer: Self-pay | Admitting: *Deleted

## 2013-07-14 VITALS — BP 132/92 | HR 53 | Wt 141.0 lb

## 2013-07-14 DIAGNOSIS — I739 Peripheral vascular disease, unspecified: Secondary | ICD-10-CM

## 2013-07-14 DIAGNOSIS — I10 Essential (primary) hypertension: Secondary | ICD-10-CM

## 2013-07-14 DIAGNOSIS — I4891 Unspecified atrial fibrillation: Secondary | ICD-10-CM

## 2013-07-14 DIAGNOSIS — I480 Paroxysmal atrial fibrillation: Secondary | ICD-10-CM | POA: Insufficient documentation

## 2013-07-14 DIAGNOSIS — I4892 Unspecified atrial flutter: Secondary | ICD-10-CM

## 2013-07-14 DIAGNOSIS — L819 Disorder of pigmentation, unspecified: Secondary | ICD-10-CM

## 2013-07-14 MED ORDER — RIVAROXABAN 20 MG PO TABS: 20.0000 mg | ORAL_TABLET | Freq: Every day | ORAL | Status: DC

## 2013-07-14 MED ORDER — METOPROLOL SUCCINATE ER 25 MG PO TB24: 25.0000 mg | ORAL_TABLET | Freq: Every day | ORAL | Status: DC

## 2013-07-14 NOTE — Progress Notes (Signed)
Patient ID: Katherine Lane, female   DOB: 1932-08-12, 80 y.o.   MRN: [id number]      SUBJECTIVE: The patient is here to followup on the results of cardiovascular testing, performed for evaluation of atrial flutter. The event monitoring demonstrated sinus rhythm with sinus arrhythmia and occasional PACs. Rate controlled atrial fibrillation was also noted. No symptoms were reported. Echocardiography demonstrated vigorous left ventricular systolic function, EF 65-70%, mild LVH, grade 2 diastolic dysfunction, mild mitral regurgitation, mild aortic root dilatation with mild aortic regurgitation, and mild right ventricular enlargement.  She has been experiencing some mild fatigue since taking 1-1/2 tablets of Toprol-XL daily. She occasionally has palpitations. She denies chest pain. She is here with her daughter [name] and her granddaughter Rodney Booze, and both have noticed the patient's feet (soles and toes) occasionally turning dark blue. The patient says they both feel hot and itchy at night. She did sustain a prior right ankle fracture which required surgical repair.  She denies bleeding problems. Both the patient and her family have several questions related to atrial fibrillation, HTN, and their respective treatments.    Allergies  Allergen Reactions  . Codeine Nausea And Vomiting    Patient states intolerance to all pain medications    Current Outpatient Prescriptions  Medication Sig Dispense Refill  . aspirin EC 81 MG tablet Take 81 mg by mouth every morning.       . Calcium Carbonate-Vitamin D (CALCIUM 500 + D PO) Take 1 tablet by mouth every morning.       . clobetasol (TEMOVATE) 0.05 % external solution Apply 1 application topically every 30 (thirty) days.       . enalapril (VASOTEC) 20 MG tablet TAKE ONE TABLET BY MOUTH IN THE EVENING.  30 tablet  5  . hydrochlorothiazide (HYDRODIURIL) 25 MG tablet TAKE (1) TABLET BY MOUTH EACH MORNING.  30 tablet  2  . LORazepam (ATIVAN) 0.5 MG tablet  TAKE (1) TABLET DAILY AS NEEDED.  30 tablet  3  . metoprolol succinate (TOPROL-XL) 25 MG 24 hr tablet TAKE ONE AND A HALF TABLETS DAILY.  45 tablet  5  . verapamil (CALAN-SR) 240 MG CR tablet TAKE (1) TABLET BY MOUTH EACH MORNING.  30 tablet  5  . zolpidem (AMBIEN) 10 MG tablet TAKE 1 TABLET BY MOUTH AT BEDTIME FOR SLEEP.  30 tablet  5   No current facility-administered medications for this visit.    Past Medical History  Diagnosis Date  . HTN (hypertension)   . Osteoporosis   . Constipation, chronic   . PONV (postoperative nausea and vomiting)   . Arthritis   . Migraine headache   . Chronic back pain   . Depression   . Anxiety   . Insomnia     Past Surgical History  Procedure Laterality Date  . Partial hysterectomy    . Ankle fracture surgery      2010-right  . Nerve repair  07/18/2011    Procedure: NERVE REPAIR;  Surgeon: Vickki Hearing, MD;  Location: [facility];  Service: Orthopedics;  Laterality: Right;  Right leg superficial peroneal nerve release    . Hardware removal  07/18/2011    Procedure: HARDWARE REMOVAL;  Surgeon: Vickki Hearing, MD;  Location: [facility];  Service: Orthopedics;  Laterality: Right;  . Colonoscopy    . Cataract extraction w/phaco Right 07/22/2012    Procedure: CATARACT EXTRACTION PHACO AND INTRAOCULAR LENS PLACEMENT (IOC);  Surgeon: Gemma Payor, MD;  Location: [facility];  Service: Ophthalmology;  Laterality: Right;  CDE:  18.93  . Cataract extraction w/phaco Left 08/09/2012    Procedure: CATARACT EXTRACTION PHACO AND INTRAOCULAR LENS PLACEMENT (IOC);  Surgeon: Gemma PayorKerry Hunt, MD;  Location: [facility];  Service: Ophthalmology;  Laterality: Left;  CDE: 17.21    History   Social History  . Marital Status: Widowed    Spouse Name: N/A    Number of Children: N/A  . Years of Education: N/A   Occupational History  . retired    Social History Main Topics  . Smoking status: Never Smoker   . Smokeless tobacco: Never Used  . Alcohol Use: No  . Drug Use: No  .  Sexual Activity: Yes    Birth Control/ Protection: Surgical   Other Topics Concern  . Not on file   Social History Narrative  . No narrative on file     Filed Vitals:   07/14/13 0920  BP: 132/92  Pulse: 53  Weight: 141 lb (63.957 kg)  SpO2: 99%    PHYSICAL EXAM General: NAD Neck: No JVD, no thyromegaly. Lungs: Clear to auscultation bilaterally with normal respiratory effort. CV: Nondisplaced PMI.  Regular rate and rhythm with occasional premature contractions noted, normal S1/S2, no S3/S4, no murmur. No pretibial or periankle edema.  No carotid bruit.  Normal pedal pulses.  Abdomen: Soft, nontender, no hepatosplenomegaly, no distention.  Neurologic: Alert and oriented x 3.  Psych: Normal affect. Extremities: No clubbing. Soles of feet noted to be bluish in color towards the proximal foot b/l. Venous varicosities (mild) noted.  ECG: reviewed and available in electronic records.  ECHO: Study Conclusions  - Left ventricle: The cavity size was normal. Wall thickness was increased in a pattern of mild LVH. Systolic function was vigorous. The estimated ejection fraction was in the range of 65% to 70%. Wall motion was normal; there were no regional wall motion abnormalities. Features are consistent with a pseudonormal left ventricular filling pattern, with concomitant abnormal relaxation and increased filling pressure (grade 2 diastolic dysfunction). - Aortic valve: Trileaflet; mildly calcified leaflets. Mild regurgitation. - Aortic root: The aortic root was mildly dilated. - Mitral valve: Calcified annulus. Mild regurgitation. - Left atrium: The atrium was mildly dilated. - Right ventricle: The cavity size was mildly dilated. - Right atrium: Central venous pressure: 3mm Hg (est). - Atrial septum: No defect or patent foramen ovale was identified. - Tricuspid valve: Trivial regurgitation. - Pulmonary arteries: PA peak pressure: 34mm Hg (S). - Pericardium, extracardiac:  There was no pericardial effusion. Impressions:  - Normal LV chamber size with mild LVH and LVEF 65-70%, grade 2 diastolic dysfunction. Mild left atrial enlargement. MAC with mild mitral regurgitation. Mildly dilated aortic root based on BSA. Mildly sclerotic aortic valve with mild regurgitation. Mild RV enlargement. PASP upper normal 34 mmHg.    ASSESSMENT AND PLAN: 1. Paroxysmal atrial flutter/fibrillation: Her CHADS-Vasc score is 4 (HTN, age, gender), giving her a high adjusted annual ischemic stroke rate. I did inform her that anticoagulation for the prevention of stroke would be recommended. Her resting heart rate is currently 53 beats per minute, and she is taking both verapamil and metoprolol. I will reduce Toprol-XL to 25 mg daily due to her fatigue. After a comprehensive discussion regarding the risk/benefit ratio of target specific anticoagulants along with the potential side effects, I have decided to initiate Xarelto 20 mg daily. GFR on 3.25 was 48 cc/min, and Hgb was normal. I will d/c ASA. 2. Hypertension: Mildly elevated diastolic BP today. I will monitor this.  I am told that her SBP is routinely in the 170 mmHg range. 3. Bluish discoloration of feet with itching and burning: I will obtain ABI's. If these are normal, I would consider evaluating for venous reflux disease.  Dispo: f/u 1 month.  Time spent: 40 minutes.  Prentice Docker, M.D., F.A.C.C.

## 2013-07-14 NOTE — Patient Instructions (Addendum)
Your physician recommends that you schedule a follow-up appointment in: 1 month    Your physician has recommended you make the following change in your medication:    DECREASE Metoprolol to 25 mg daily  STOP Aspirin  START Xarelto  20 mg daily   Your physician has requested that you have an ankle brachial index (ABI). During this test an ultrasound and blood pressure cuff are used to evaluate the arteries that supply the arms and legs with blood. Allow thirty minutes for this exam. There are no restrictions or special instructions.    Thank you for choosing [facility] !

## 2013-07-18 ENCOUNTER — Ambulatory Visit (HOSPITAL_COMMUNITY)
Admission: RE | Admit: 2013-07-18 | Discharge: 2013-07-18 | Disposition: A | Discharge status: Home / Self Care | Payer: Medicare Other | Source: Ambulatory Visit | Attending: Cardiovascular Disease | Admitting: Cardiovascular Disease

## 2013-07-18 DIAGNOSIS — I739 Peripheral vascular disease, unspecified: Secondary | ICD-10-CM

## 2013-07-18 DIAGNOSIS — I779 Disorder of arteries and arterioles, unspecified: Secondary | ICD-10-CM | POA: Insufficient documentation

## 2013-08-16 ENCOUNTER — Ambulatory Visit (INDEPENDENT_AMBULATORY_CARE_PROVIDER_SITE_OTHER): Payer: Medicare Other | Admitting: Cardiovascular Disease

## 2013-08-16 ENCOUNTER — Encounter: Payer: Self-pay | Admitting: Cardiovascular Disease

## 2013-08-16 VITALS — BP 174/80 | HR 76 | Ht 63.0 in | Wt 139.0 lb

## 2013-08-16 DIAGNOSIS — I10 Essential (primary) hypertension: Secondary | ICD-10-CM

## 2013-08-16 DIAGNOSIS — I4891 Unspecified atrial fibrillation: Secondary | ICD-10-CM

## 2013-08-16 DIAGNOSIS — I480 Paroxysmal atrial fibrillation: Secondary | ICD-10-CM

## 2013-08-16 DIAGNOSIS — L819 Disorder of pigmentation, unspecified: Secondary | ICD-10-CM

## 2013-08-16 MED ORDER — ENALAPRIL MALEATE 10 MG PO TABS: ORAL_TABLET | ORAL | Status: DC

## 2013-08-16 NOTE — Progress Notes (Signed)
Patient ID: [place] [address] [place], female   DOB: 03-Oct-1932, 80 y.o.   MRN: [id number]      SUBJECTIVE: The patient is here to followup for atrial fibrillation, hypertension, and to follow up on testing. ABIs demonstrated no evidence of hemodynamically significant vasoocclusive disease affecting either lower extremity. She is feeling less fatigued after a reduced her Toprol-XL to 25 mg daily. She has had no bleeding problems with Xarelto.  Soc: She was widowed 2 years ago after 59 years of marriage. She has 3 daughters, one who lives in [place], one who lives in [place], and one who [place]. At her last visit, she was here with her daughter [place] and her granddaughter [place].   Allergies  Allergen Reactions  . Codeine Nausea And Vomiting    Patient states intolerance to all pain medications    Current Outpatient Prescriptions  Medication Sig Dispense Refill  . Calcium Carbonate-Vitamin D (CALCIUM 500 + D PO) Take 1 tablet by mouth every morning.       . clobetasol (TEMOVATE) 0.05 % external solution Apply 1 application topically every 30 (thirty) days.       . enalapril (VASOTEC) 20 MG tablet TAKE ONE TABLET BY MOUTH IN THE EVENING.  30 tablet  5  . hydrochlorothiazide (HYDRODIURIL) 25 MG tablet TAKE (1) TABLET BY MOUTH EACH MORNING.  30 tablet  2  . LORazepam (ATIVAN) 0.5 MG tablet TAKE (1) TABLET DAILY AS NEEDED.  30 tablet  3  . metoprolol succinate (TOPROL XL) 25 MG 24 hr tablet Take 1 tablet (25 mg total) by mouth daily.  90 tablet  3  . rivaroxaban (XARELTO) 20 MG TABS tablet Take 1 tablet (20 mg total) by mouth daily with supper.  30 tablet  6  . verapamil (CALAN-SR) 240 MG CR tablet TAKE (1) TABLET BY MOUTH EACH MORNING.  30 tablet  5  . zolpidem (AMBIEN) 10 MG tablet TAKE 1 TABLET BY MOUTH AT BEDTIME FOR SLEEP.  30 tablet  5   No current facility-administered medications for this visit.    Past Medical History  Diagnosis Date  . HTN (hypertension)   .  Osteoporosis   . Constipation, chronic   . PONV (postoperative nausea and vomiting)   . Arthritis   . Migraine headache   . Chronic back pain   . Depression   . Anxiety   . Insomnia     Past Surgical History  Procedure Laterality Date  . Partial hysterectomy    . Ankle fracture surgery      2010-right  . Nerve repair  07/18/2011    Procedure: NERVE REPAIR;  Surgeon: Vickki HearingStanley E Harrison, MD;  Location: [facility];  Service: Orthopedics;  Laterality: Right;  Right leg superficial peroneal nerve release    . Hardware removal  07/18/2011    Procedure: HARDWARE REMOVAL;  Surgeon: Vickki HearingStanley E Harrison, MD;  Location: [facility];  Service: Orthopedics;  Laterality: Right;  . Colonoscopy    . Cataract extraction w/phaco Right 07/22/2012    Procedure: CATARACT EXTRACTION PHACO AND INTRAOCULAR LENS PLACEMENT (IOC);  Surgeon: Gemma PayorKerry Hunt, MD;  Location: [facility];  Service: Ophthalmology;  Laterality: Right;  CDE:  18.93  . Cataract extraction w/phaco Left 08/09/2012    Procedure: CATARACT EXTRACTION PHACO AND INTRAOCULAR LENS PLACEMENT (IOC);  Surgeon: Gemma PayorKerry Hunt, MD;  Location: [facility];  Service: Ophthalmology;  Laterality: Left;  CDE: 17.21    History   Social History  . Marital Status: Widowed  Spouse Name: N/A    Number of Children: N/A  . Years of Education: N/A   Occupational History  . retired    Social History Main Topics  . Smoking status: Never Smoker   . Smokeless tobacco: Never Used  . Alcohol Use: No  . Drug Use: No  . Sexual Activity: Yes    Birth Control/ Protection: Surgical   Other Topics Concern  . Not on file   Social History Narrative  . No narrative on file     Filed Vitals:   08/16/13 0836  BP: 174/80  Pulse: 76  Height: 5\' 3"  (1.6 m)  Weight: 139 lb (63.05 kg)    PHYSICAL EXAM General: NAD  Neck: No JVD, no thyromegaly.  Lungs: Clear to auscultation bilaterally with normal respiratory effort.  CV: Nondisplaced PMI. Regular rate and rhythm with occasional  premature contractions noted, normal S1/S2, no S3/S4, no murmur. No pretibial or periankle edema. No carotid bruit. Normal pedal pulses.  Abdomen: Soft, nontender, no hepatosplenomegaly, no distention.  Neurologic: Alert and oriented x 3.  Psych: Normal affect.  Extremities: No clubbing. Soles of feet noted to be bluish in color towards the proximal foot b/l. Venous varicosities (mild) noted.    ECG: reviewed and available in electronic records.      ASSESSMENT AND PLAN: 1. Paroxysmal atrial flutter/fibrillation: Her CHADS-Vasc score is 4 (HTN, age, gender), giving her a high adjusted annual ischemic stroke rate. She is taking verapamil and at her last visit, I reduced Toprol-XL to 25 mg daily due to her fatigue. I also initiated Xarelto 20 mg daily and d/c ASA. She is symptomatically stable. 2. Hypertension: Elevated BP today. I will increase enalapril to10 mg q am and 20 mg q pm. 3. Bluish discoloration of feet with itching and burning: ABI's were normal. She likely has benign acrocyanosis. If they progress in severity, I would consider evaluating for venous reflux disease. She only has occasional burning and itching of her toes at night.  Dispo: f/u 4 months.  Prentice DockerSuresh Jaron Czarnecki, M.D., F.A.C.C.

## 2013-08-16 NOTE — Patient Instructions (Addendum)
Your physician recommends that you schedule a follow-up appointment in: 4 months with Dr Purvis SheffieldKoneswaran  Your physician has recommended you make the following change in your medication:   TAKE 10 MG OF ENALAPRIL IN THE MORNING AND TAKE 20 MG IN THE EVENING  Thank you for choosing [facility]!!

## 2013-08-26 ENCOUNTER — Ambulatory Visit (INDEPENDENT_AMBULATORY_CARE_PROVIDER_SITE_OTHER): Payer: Medicare Other | Admitting: Family Medicine

## 2013-08-26 ENCOUNTER — Encounter: Payer: Self-pay | Admitting: Family Medicine

## 2013-08-26 VITALS — BP 130/80 | Temp 98.3°F | Ht 62.5 in | Wt 137.4 lb

## 2013-08-26 DIAGNOSIS — H65191 Other acute nonsuppurative otitis media, right ear: Secondary | ICD-10-CM

## 2013-08-26 DIAGNOSIS — H65199 Other acute nonsuppurative otitis media, unspecified ear: Secondary | ICD-10-CM

## 2013-08-26 MED ORDER — AMOXICILLIN 500 MG PO TABS: 500.0000 mg | ORAL_TABLET | Freq: Three times a day (TID) | ORAL | Status: AC

## 2013-08-26 MED ORDER — FLUTICASONE PROPIONATE 50 MCG/ACT NA SUSP: 2.0000 | Freq: Every day | NASAL | Status: DC

## 2013-08-26 NOTE — Progress Notes (Signed)
   Subjective:    Patient ID: [place] [address] [place], female    DOB: [birth year], 80 y.o.   MRN: [id number]  Sinusitis This is a new problem. The current episode started in the past 7 days. The problem is unchanged. There has been no fever. The pain is moderate. Associated symptoms include congestion, coughing, ear pain, sinus pressure and a sore throat. Past treatments include nothing. The treatment provided no relief.   Patient states that she has a spot on her face and wonders does she need to see Dr. Margo AyeHall for this.   Sinus sx for days now with ear pain Exposed to strep Moderate drainage Review of Systems  HENT: Positive for congestion, ear pain, sinus pressure and sore throat.   Respiratory: Positive for cough.   some sneezing     Objective:   Physical Exam  Nursing note and vitals reviewed. Constitutional: She appears well-developed.  HENT:  Head: Normocephalic.  Left Ear: External ear normal.  Nose: Nose normal.  Mouth/Throat: Oropharynx is clear and moist. No oropharyngeal exudate.  Right OM  Neck: Neck supple.  Cardiovascular: Normal rate and normal heart sounds.   No murmur heard. Pulmonary/Chest: Effort normal and breath sounds normal. She has no wheezes.  Lymphadenopathy:    She has no cervical adenopathy.  Skin: Skin is warm and dry.          Assessment & Plan:  Right otitis media sinus infection antibiotics prescribed warning signs discussed Flonase on a regular basis should gradually get better call us if any problems

## 2013-08-29 ENCOUNTER — Encounter: Payer: Self-pay | Admitting: Family Medicine

## 2013-08-29 ENCOUNTER — Telehealth: Payer: Self-pay | Admitting: Family Medicine

## 2013-08-29 ENCOUNTER — Ambulatory Visit (INDEPENDENT_AMBULATORY_CARE_PROVIDER_SITE_OTHER): Payer: Medicare Other | Admitting: Family Medicine

## 2013-08-29 VITALS — BP 118/78 | Temp 98.8°F | Ht 62.5 in | Wt 137.0 lb

## 2013-08-29 DIAGNOSIS — R509 Fever, unspecified: Secondary | ICD-10-CM

## 2013-08-29 DIAGNOSIS — H66009 Acute suppurative otitis media without spontaneous rupture of ear drum, unspecified ear: Secondary | ICD-10-CM

## 2013-08-29 DIAGNOSIS — J069 Acute upper respiratory infection, unspecified: Secondary | ICD-10-CM

## 2013-08-29 DIAGNOSIS — H66001 Acute suppurative otitis media without spontaneous rupture of ear drum, right ear: Secondary | ICD-10-CM

## 2013-08-29 MED ORDER — LEVOFLOXACIN 500 MG PO TABS: 500.0000 mg | ORAL_TABLET | Freq: Every day | ORAL | Status: AC

## 2013-08-29 MED ORDER — CEFTRIAXONE SODIUM 1 G IJ SOLR
500.0000 mg | Freq: Once | INTRAMUSCULAR | Status: AC
  Administered 2013-08-29: 500 mg via INTRAMUSCULAR

## 2013-08-29 NOTE — Telephone Encounter (Signed)
Patient thought she was supposed to have a medication called in today? But nothing called in. Please advise.   [place]

## 2013-08-29 NOTE — Telephone Encounter (Signed)
Dr. Lorin PicketScott sent in medication. Patient was notified.

## 2013-08-29 NOTE — Telephone Encounter (Signed)
Patient seen on Friday 08/26/2013 for otitis media of right ear and given amoxicillan. Now, she says that she has had fevers up to 100 this weekend and violently coughing deep. She said she is much worse that she was Friday. Wants to know what you recommend she do? [facility]

## 2013-08-29 NOTE — Progress Notes (Signed)
   Subjective:    Patient ID: [place], female    DOB: [birth year], 80 y.o.   MRN: [id number]  HPI Patient is here for a recheck on her cough.  She was seen here on Friday.  Pt had a low grade fever yesterday. She said he cough isn't as "violent" as it was.  Her back is starting to get sore from coughing.  Patient states that she's had progressive coughing and some fever not feeling good over the weekend denies any other particular troubles.  Review of Systems  Constitutional: Negative for fever and activity change.  HENT: Positive for congestion and rhinorrhea. Negative for ear pain.   Eyes: Negative for discharge.  Respiratory: Positive for cough. Negative for shortness of breath and wheezing.   Cardiovascular: Negative for chest pain.       Objective:   Physical Exam  Nursing note and vitals reviewed. Constitutional: She appears well-developed.  HENT:  Head: Normocephalic.  Nose: Nose normal.  Mouth/Throat: Oropharynx is clear and moist. No oropharyngeal exudate.  Right otitis media with small hole in the eardrum  Neck: Neck supple.  Cardiovascular: Normal rate and normal heart sounds.   No murmur heard. Pulmonary/Chest: Effort normal and breath sounds normal. She has no wheezes.  Lymphadenopathy:    She has no cervical adenopathy.  Skin: Skin is warm and dry.          Assessment & Plan:  Eardrums rupture draining adequately. Throat is normal no sign of pneumonia we will do a shot of antibiotics switch to a different antibiotic followup if progressive troubles warning signs discussed.  Recheck here in a few weeks time should heal up on its own

## 2013-08-29 NOTE — Addendum Note (Signed)
Addended by: Lilyan PuntLUKING, Laqueta Bonaventura A on: 08/29/2013 04:42 PM   Modules accepted: Orders

## 2013-08-29 NOTE — Telephone Encounter (Signed)
Pt made appt for this am for recheck.

## 2013-09-05 ENCOUNTER — Other Ambulatory Visit: Payer: Self-pay | Admitting: Nurse Practitioner

## 2013-09-14 ENCOUNTER — Other Ambulatory Visit: Payer: Self-pay | Admitting: Nurse Practitioner

## 2013-09-14 NOTE — Telephone Encounter (Signed)
Ok 6 mo worth 

## 2013-09-20 ENCOUNTER — Encounter: Payer: Self-pay | Admitting: Family Medicine

## 2013-09-20 ENCOUNTER — Ambulatory Visit (INDEPENDENT_AMBULATORY_CARE_PROVIDER_SITE_OTHER): Payer: Medicare Other | Admitting: Family Medicine

## 2013-09-20 VITALS — BP 130/84 | Ht 62.5 in | Wt 136.4 lb

## 2013-09-20 DIAGNOSIS — H7291 Unspecified perforation of tympanic membrane, right ear: Secondary | ICD-10-CM

## 2013-09-20 DIAGNOSIS — H7290 Unspecified perforation of tympanic membrane, unspecified ear: Secondary | ICD-10-CM

## 2013-09-20 MED ORDER — FLUCONAZOLE 150 MG PO TABS: 150.0000 mg | ORAL_TABLET | Freq: Once | ORAL | Status: DC

## 2013-09-20 NOTE — Progress Notes (Signed)
   Subjective:    Patient ID: [place], female    DOB: [birth year], 80 y.o.   MRN: [id number]  Otalgia  There is pain in the right ear. This is a new problem. The current episode started 1 to 4 weeks ago. The problem has been rapidly improving. There has been no fever. The pain is at a severity of 0/10. The patient is experiencing no pain. Associated symptoms comments: Fullness in right ear. She has tried antibiotics for the symptoms. The treatment provided significant relief.  This is a follow up visit.   Patient has no other concerns at this time.   Review of Systems  HENT: Positive for ear pain.        Objective:   Physical Exam  Small hole noted in the eardrum no redness no drainage throat normal lungs clear      Assessment & Plan:  On exam there is still a small hole in the eardrum but it's very small it should close up it is not closed up by this fall consider referral to ENT

## 2013-09-30 ENCOUNTER — Other Ambulatory Visit: Payer: Self-pay | Admitting: Family Medicine

## 2013-11-01 ENCOUNTER — Encounter: Payer: Self-pay | Admitting: Family Medicine

## 2013-11-01 ENCOUNTER — Ambulatory Visit (INDEPENDENT_AMBULATORY_CARE_PROVIDER_SITE_OTHER): Payer: Medicare Other | Admitting: Family Medicine

## 2013-11-01 VITALS — BP 124/88 | Ht 62.5 in | Wt 138.0 lb

## 2013-11-01 DIAGNOSIS — Z23 Encounter for immunization: Secondary | ICD-10-CM

## 2013-11-01 DIAGNOSIS — E782 Mixed hyperlipidemia: Secondary | ICD-10-CM

## 2013-11-01 DIAGNOSIS — Z79899 Other long term (current) drug therapy: Secondary | ICD-10-CM

## 2013-11-01 MED ORDER — VERAPAMIL HCL ER 240 MG PO TBCR: EXTENDED_RELEASE_TABLET | ORAL | Status: DC

## 2013-11-01 MED ORDER — HYDROCORTISONE 2.5 % RE CREA: 1.0000 "application " | TOPICAL_CREAM | Freq: Three times a day (TID) | RECTAL | Status: DC

## 2013-11-01 MED ORDER — ONDANSETRON 4 MG PO TBDP: 4.0000 mg | ORAL_TABLET | Freq: Four times a day (QID) | ORAL | Status: DC | PRN

## 2013-11-01 MED ORDER — HYDROCHLOROTHIAZIDE 25 MG PO TABS: ORAL_TABLET | ORAL | Status: DC

## 2013-11-01 NOTE — Progress Notes (Signed)
   Subjective:    Patient ID: [place], female    DOB: 05-13-1932, 80 y.o.   MRN: [id number]  HPI  Review of Systems     Objective:   Physical Exam        Assessment & Plan:

## 2013-11-01 NOTE — Progress Notes (Signed)
   Subjective:    Patient ID: [place], female    DOB: [birth year], 80 y.o.   MRN: [id number]  HPI Patient is here today for annual exam. Patient states she is having some rectal problems. Patient is requesting a rectal exam. Patient notes rectal irritation intermittently. No major pain or discomfort.  Occasional hard stools.  Compliant with blood pressure medication. No obvious side effects.  Exercise not as much this summer, not walking as much  Diet overall not as good   Last mammogram 2014   Hx of osteopenia, calcium and vit d two tabs daily  Pt takes daily senna s  No blood in stools'  Last colonoscopy at [place] Review of Systems  Constitutional: Negative for activity change, appetite change and fatigue.  HENT: Negative for congestion, ear discharge and rhinorrhea.   Eyes: Negative for discharge.  Respiratory: Negative for cough, chest tightness and wheezing.   Cardiovascular: Negative for chest pain.  Gastrointestinal: Negative for vomiting and abdominal pain.  Genitourinary: Negative for frequency and difficulty urinating.  Musculoskeletal: Negative for neck pain.  Allergic/Immunologic: Negative for environmental allergies and food allergies.  Neurological: Negative for weakness and headaches.  Psychiatric/Behavioral: Negative for behavioral problems and agitation.  All other systems reviewed and are negative.      Objective:   Physical Exam  Vitals reviewed. Constitutional: She is oriented to person, place, and time. She appears well-developed and well-nourished.  HENT:  Head: Normocephalic.  Right Ear: External ear normal.  Left Ear: External ear normal.  Eyes: Pupils are equal, round, and reactive to light.  Neck: Normal range of motion. No thyromegaly present.  Cardiovascular: Normal rate, regular rhythm, normal heart sounds and intact distal pulses.   No murmur heard. Pulmonary/Chest: Effort normal and breath sounds normal. No respiratory  distress. She has no wheezes.  Abdominal: Soft. Bowel sounds are normal. She exhibits no distension and no mass. There is no tenderness.  Genitourinary:  Small irritated perirectal hemorrhoid noted. Rectal exam otherwise normal  Musculoskeletal: Normal range of motion. She exhibits no edema and no tenderness.  Lymphadenopathy:    She has no cervical adenopathy.  Neurological: She is alert and oriented to person, place, and time. She exhibits normal muscle tone.  Skin: Skin is warm and dry.  Psychiatric: She has a normal mood and affect. Her behavior is normal.          Assessment & Plan:  Impression 1 wellness exam. #2 hypertension good control. #3 protracted grief ongoing challenges with loss of spouse last year. #4 hemorrhoid with irritation discussed plan Anusol cream. MiraLAX to soften stools maintain blood pressure medication. Diet exercise discussed. Patient to schedule mammogram. Declines flu shot declines bone density test WSL or

## 2013-11-03 ENCOUNTER — Other Ambulatory Visit: Payer: Self-pay | Admitting: Family Medicine

## 2013-11-03 DIAGNOSIS — Z139 Encounter for screening, unspecified: Secondary | ICD-10-CM

## 2013-11-07 LAB — BASIC METABOLIC PANEL
BUN: 17 mg/dL (ref 6–23)
CO2: 30 mEq/L (ref 19–32)
Calcium: 9.3 mg/dL (ref 8.4–10.5)
Chloride: 105 mEq/L (ref 96–112)
Creat: 0.96 mg/dL (ref 0.50–1.10)
Glucose, Bld: 79 mg/dL (ref 70–99)
Potassium: 4.4 mEq/L (ref 3.5–5.3)
Sodium: 142 mEq/L (ref 135–145)

## 2013-11-07 LAB — LIPID PANEL
Cholesterol: 175 mg/dL (ref 0–200)
HDL: 56 mg/dL (ref >39)
LDL Cholesterol: 93 mg/dL (ref 0–99)
Total CHOL/HDL Ratio: 3.1 Ratio
Triglycerides: 131 mg/dL (ref <150)
VLDL: 26 mg/dL (ref 0–40)

## 2013-11-07 LAB — HEPATIC FUNCTION PANEL
ALT: 11 U/L (ref 0–35)
AST: 16 U/L (ref 0–37)
Albumin: 4.2 g/dL (ref 3.5–5.2)
Alkaline Phosphatase: 70 U/L (ref 39–117)
Bilirubin, Direct: 0.1 mg/dL (ref 0.0–0.3)
Indirect Bilirubin: 0.5 mg/dL (ref 0.2–1.2)
Total Bilirubin: 0.6 mg/dL (ref 0.2–1.2)
Total Protein: 6.3 g/dL (ref 6.0–8.3)

## 2013-11-09 ENCOUNTER — Encounter: Payer: Self-pay | Admitting: Family Medicine

## 2013-11-10 ENCOUNTER — Ambulatory Visit (HOSPITAL_COMMUNITY): Payer: Medicare Other

## 2013-12-19 ENCOUNTER — Ambulatory Visit (INDEPENDENT_AMBULATORY_CARE_PROVIDER_SITE_OTHER): Payer: Medicare Other | Admitting: Cardiovascular Disease

## 2013-12-19 VITALS — BP 160/88 | HR 78 | Ht 62.0 in | Wt 135.0 lb

## 2013-12-19 DIAGNOSIS — I10 Essential (primary) hypertension: Secondary | ICD-10-CM

## 2013-12-19 DIAGNOSIS — I480 Paroxysmal atrial fibrillation: Secondary | ICD-10-CM

## 2013-12-19 NOTE — Progress Notes (Signed)
Patient ID: [place], female   DOB: [birth year], 80 y.o.   MRN: [id number]      SUBJECTIVE: [place] presents for f/u of paroxysmal atrial fibrillation. She is doing quite well and denies any symptoms of chest pressure or palpitations. She recently celebrated her 62nd wedding anniversary, and she said she did ok emotionally through this time. She has had infrequent leg and feet cramping.   Review of Systems: As per "subjective", otherwise negative.  Allergies  Allergen Reactions  . Codeine Nausea And Vomiting    Patient states intolerance to all pain medications    Current Outpatient Prescriptions  Medication Sig Dispense Refill  . Calcium Carbonate-Vitamin D (CALCIUM 500 + D PO) Take 1 tablet by mouth every morning.     . clobetasol (TEMOVATE) 0.05 % external solution APPLY TO AFFECTED AREAS TWICE DAILY. 50 mL 4  . enalapril (VASOTEC) 20 MG tablet Take 20 mg by mouth daily. 10 mg in AM and 20 mg PM    . hydrochlorothiazide (HYDRODIURIL) 25 MG tablet TAKE (1) TABLET BY MOUTH EACH MORNING. 90 tablet 1  . hydrocortisone (ANUSOL-HC) 2.5 % rectal cream Place 1 application rectally 3 (three) times daily. 30 g 0  . LORazepam (ATIVAN) 0.5 MG tablet TAKE (1) TABLET DAILY AS NEEDED. 30 tablet 3  . metoprolol succinate (TOPROL XL) 25 MG 24 hr tablet Take 1 tablet (25 mg total) by mouth daily. 90 tablet 3  . ondansetron (ZOFRAN-ODT) 4 MG disintegrating tablet Take 1 tablet (4 mg total) by mouth every 6 (six) hours as needed for nausea or vomiting. 8 tablet 5  . rivaroxaban (XARELTO) 20 MG TABS tablet Take 1 tablet (20 mg total) by mouth daily with supper. 30 tablet 6  . verapamil (CALAN-SR) 240 MG CR tablet TAKE (1) TABLET BY MOUTH EACH MORNING. 90 tablet 1  . zolpidem (AMBIEN) 10 MG tablet TAKE 1 TABLET BY MOUTH AT BEDTIME FOR SLEEP. 30 tablet 5   No current facility-administered medications for this visit.    Past Medical History  Diagnosis Date  . HTN (hypertension)   .  Osteoporosis   . Constipation, chronic   . PONV (postoperative nausea and vomiting)   . Arthritis   . Migraine headache   . Chronic back pain   . Depression   . Anxiety   . Insomnia     Past Surgical History  Procedure Laterality Date  . Partial hysterectomy    . Ankle fracture surgery      2010-right  . Nerve repair  07/18/2011    Procedure: NERVE REPAIR;  Surgeon: Vickki HearingStanley E Harrison, MD;  Location: [facility];  Service: Orthopedics;  Laterality: Right;  Right leg superficial peroneal nerve release    . Hardware removal  07/18/2011    Procedure: HARDWARE REMOVAL;  Surgeon: Vickki HearingStanley E Harrison, MD;  Location: [facility];  Service: Orthopedics;  Laterality: Right;  . Colonoscopy    . Cataract extraction w/phaco Right 07/22/2012    Procedure: CATARACT EXTRACTION PHACO AND INTRAOCULAR LENS PLACEMENT (IOC);  Surgeon: Gemma PayorKerry Hunt, MD;  Location: [facility];  Service: Ophthalmology;  Laterality: Right;  CDE:  18.93  . Cataract extraction w/phaco Left 08/09/2012    Procedure: CATARACT EXTRACTION PHACO AND INTRAOCULAR LENS PLACEMENT (IOC);  Surgeon: Gemma PayorKerry Hunt, MD;  Location: [facility];  Service: Ophthalmology;  Laterality: Left;  CDE: 17.21    History   Social History  . Marital Status: Widowed    Spouse Name: N/A    Number of Children:  N/A  . Years of Education: N/A   Occupational History  . retired    Social History Main Topics  . Smoking status: Never Smoker   . Smokeless tobacco: Never Used  . Alcohol Use: No  . Drug Use: No  . Sexual Activity: Yes    Birth Control/ Protection: Surgical   Other Topics Concern  . Not on file   Social History Narrative  . No narrative on file     Filed Vitals:   12/19/13 0850  BP: 160/88  Pulse: 78  Height: 5\' 2"  (1.575 m)  Weight: 135 lb (61.236 kg)    PHYSICAL EXAM General: NAD HEENT: Normal. Neck: No JVD, no thyromegaly. Lungs: Clear to auscultation bilaterally with normal respiratory effort. CV: Nondisplaced PMI.  Regular rate and rhythm,  normal S1/S2, no S3/S4, no murmur. No pretibial or periankle edema.  No carotid bruit.  Normal pedal pulses.  Abdomen: Soft, nontender, no hepatosplenomegaly, no distention.  Neurologic: Alert and oriented x 3.  Psych: Normal affect. Skin: Normal. Musculoskeletal: Normal range of motion, no gross deformities. Extremities: No clubbing or cyanosis.   ECG: Most recent ECG reviewed.      ASSESSMENT AND PLAN: 1. Paroxysmal atrial fibrillation: Symptomatically stable. No changes to medication regimen which includes Toprol-SL 25 mg, verapamil 240 mg, and Xarelto. 2. Essential HTN: Not usually elevated, and patient feels it is up this morning due to rushing around. No changes to therapy which includes HCTZ and enalapril.  Dispo: f/u 6 months.  Prentice DockerSuresh Benaiah Behan, M.D., F.A.C.C.

## 2013-12-19 NOTE — Patient Instructions (Signed)
Your physician wants you to follow-up in: 6 months You will receive a reminder letter in the mail two months in advance. If you don't receive a letter, please call our office to schedule the follow-up appointment.     Your physician recommends that you continue on your current medications as directed. Please refer to the Current Medication list given to you today.      Thank you for choosing [facility] !        

## 2013-12-30 ENCOUNTER — Other Ambulatory Visit: Payer: Self-pay | Admitting: Family Medicine

## 2014-01-05 ENCOUNTER — Other Ambulatory Visit: Payer: Self-pay | Admitting: Cardiovascular Disease

## 2014-02-22 ENCOUNTER — Other Ambulatory Visit: Payer: Self-pay | Admitting: Family Medicine

## 2014-02-22 ENCOUNTER — Other Ambulatory Visit: Payer: Self-pay | Admitting: *Deleted

## 2014-02-22 ENCOUNTER — Telehealth: Payer: Self-pay | Admitting: Family Medicine

## 2014-02-22 MED ORDER — LORAZEPAM 0.5 MG PO TABS: ORAL_TABLET | ORAL | Status: DC

## 2014-02-22 NOTE — Telephone Encounter (Signed)
Ok plus one ref 

## 2014-02-22 NOTE — Telephone Encounter (Signed)
Patient is requesting a refill of her ativan. Pt stated that she doesn't take it Often but with the holidays that past she feels she needs to take them.  [facility].

## 2014-02-22 NOTE — Telephone Encounter (Signed)
Rx faxed to pharmacy. Patient notified. 

## 2014-02-22 NOTE — Telephone Encounter (Signed)
Last seen 10/22/13

## 2014-02-28 ENCOUNTER — Other Ambulatory Visit: Payer: Self-pay | Admitting: Family Medicine

## 2014-02-28 ENCOUNTER — Other Ambulatory Visit: Payer: Self-pay | Admitting: Cardiovascular Disease

## 2014-02-28 NOTE — Telephone Encounter (Signed)
Ok 6 mo 

## 2014-02-28 NOTE — Telephone Encounter (Signed)
Last seen 11/01/13

## 2014-03-01 ENCOUNTER — Telehealth: Payer: Self-pay | Admitting: Cardiovascular Disease

## 2014-03-01 ENCOUNTER — Telehealth: Payer: Self-pay | Admitting: *Deleted

## 2014-03-01 MED ORDER — RIVAROXABAN 20 MG PO TABS: 20.0000 mg | ORAL_TABLET | Freq: Every day | ORAL | Status: DC

## 2014-03-01 NOTE — Telephone Encounter (Signed)
Please call regarding Xarelto / tgs

## 2014-03-01 NOTE — Telephone Encounter (Signed)
Pt needs xarelto called in to [facility]

## 2014-03-02 NOTE — Telephone Encounter (Signed)
LMTCB

## 2014-03-02 NOTE — Telephone Encounter (Signed)
Pt has 1 month and 20 days of samples, I will call her when we get more in. Daughter will try to activate 20 day trial card

## 2014-03-03 ENCOUNTER — Telehealth: Payer: Self-pay

## 2014-03-03 NOTE — Telephone Encounter (Signed)
Spoke with Pharmacist Tripp at [facility] and notified him patient was approved through [facility] for Xarelto 20 mg through 03/03/2015.Tripp states patient already knows and has discount card.

## 2014-04-25 ENCOUNTER — Other Ambulatory Visit: Payer: Self-pay

## 2014-04-25 ENCOUNTER — Telehealth: Payer: Self-pay | Admitting: *Deleted

## 2014-04-25 NOTE — Telephone Encounter (Signed)
Pt states she will speak with Dr Purvis SheffieldKoneswaran at next visit as she cannot afford $38.70 monthly for Xarelto

## 2014-04-25 NOTE — Telephone Encounter (Signed)
Pt wants samples for Rob BuntingXarelto,she was already given first month free. Pt's cost per Tripp at [facility] to patient is $38.70 monthly.Pt will not qualify for patient Assistance as her monthly co-pay is too low. LMTCB

## 2014-04-25 NOTE — Telephone Encounter (Signed)
Pt only had 9 more xarelto left. Needs samples

## 2014-05-02 ENCOUNTER — Encounter: Payer: Self-pay | Admitting: Family Medicine

## 2014-05-02 ENCOUNTER — Ambulatory Visit (INDEPENDENT_AMBULATORY_CARE_PROVIDER_SITE_OTHER): Payer: Medicare Other | Admitting: Family Medicine

## 2014-05-02 VITALS — BP 122/80 | Ht 61.5 in | Wt 138.0 lb

## 2014-05-02 DIAGNOSIS — M79672 Pain in left foot: Secondary | ICD-10-CM

## 2014-05-02 DIAGNOSIS — I10 Essential (primary) hypertension: Secondary | ICD-10-CM

## 2014-05-02 DIAGNOSIS — M810 Age-related osteoporosis without current pathological fracture: Secondary | ICD-10-CM | POA: Diagnosis not present

## 2014-05-02 MED ORDER — HYDROCHLOROTHIAZIDE 25 MG PO TABS: ORAL_TABLET | ORAL | Status: DC

## 2014-05-02 MED ORDER — VERAPAMIL HCL ER 240 MG PO TBCR: EXTENDED_RELEASE_TABLET | ORAL | Status: DC

## 2014-05-02 NOTE — Progress Notes (Signed)
   Subjective:    Patient ID: [place], female    DOB: 06-Aug-1932, 81 y.o.   MRN: [id number] Patient states she does not know why she is here but then proceeded to give multiple concerns to be addressed. Hypertension This is a chronic problem. The current episode started more than 1 year ago.   no obvious side effects with blood pressure medicine. States it helps.  Uses Ambien for insomnia. Overall still helps. Uses it pretty much every night.  At first said she needed a wellness exam but then I reminded her she just had 16 months ago. So patient stated did not need one  concerns about back pain for the past 2 days. Between the shoulders  Aching and catching at time  Pain started last wk. Did not injure foot. Notes left distal foot pain. Worried because of history of osteoporosis.   Concerns about left foot pain for the past 1 week.     Review of Systems No headache no chest pain no back pain no abdominal pain no change in bowel habits    Objective:   Physical Exam  Alert vital stable blood pressure good on repeat HEENT normal lungs clear. Heart regular in rhythm. Left parascapular region some tenderness deep palpation ankles without edema distal foot some tenderness to palpation dorsally      Assessment & Plan:  Impression 1 hypertension good control #2 insomnia discussed #3 upper back pain discussed #4 foot pain question stress fracture though doubt plan foot x-ray. Maintain same medications. Diet exercise discussed in encourage check every 6 months discussed once again

## 2014-05-03 ENCOUNTER — Ambulatory Visit (HOSPITAL_COMMUNITY)
Admission: RE | Admit: 2014-05-03 | Discharge: 2014-05-03 | Disposition: A | Discharge status: Home / Self Care | Payer: Medicare Other | Source: Ambulatory Visit | Attending: Family Medicine | Admitting: Family Medicine

## 2014-05-03 DIAGNOSIS — R937 Abnormal findings on diagnostic imaging of other parts of musculoskeletal system: Secondary | ICD-10-CM | POA: Diagnosis not present

## 2014-05-03 DIAGNOSIS — M79672 Pain in left foot: Secondary | ICD-10-CM | POA: Diagnosis present

## 2014-06-19 ENCOUNTER — Ambulatory Visit (INDEPENDENT_AMBULATORY_CARE_PROVIDER_SITE_OTHER): Payer: Medicare Other | Admitting: Cardiovascular Disease

## 2014-06-19 ENCOUNTER — Encounter: Payer: Self-pay | Admitting: Cardiovascular Disease

## 2014-06-19 VITALS — BP 160/90 | HR 62 | Wt 137.6 lb

## 2014-06-19 DIAGNOSIS — R059 Cough, unspecified: Secondary | ICD-10-CM

## 2014-06-19 DIAGNOSIS — I10 Essential (primary) hypertension: Secondary | ICD-10-CM

## 2014-06-19 DIAGNOSIS — I480 Paroxysmal atrial fibrillation: Secondary | ICD-10-CM

## 2014-06-19 DIAGNOSIS — L819 Disorder of pigmentation, unspecified: Secondary | ICD-10-CM | POA: Diagnosis not present

## 2014-06-19 DIAGNOSIS — R05 Cough: Secondary | ICD-10-CM | POA: Diagnosis not present

## 2014-06-19 MED ORDER — ENALAPRIL MALEATE 20 MG PO TABS: 20.0000 mg | ORAL_TABLET | Freq: Every day | ORAL | Status: DC

## 2014-06-19 MED ORDER — ENALAPRIL MALEATE 20 MG PO TABS: 20.0000 mg | ORAL_TABLET | Freq: Two times a day (BID) | ORAL | Status: DC

## 2014-06-19 NOTE — Patient Instructions (Signed)
Your physician recommends that you schedule a follow-up appointment in: August with Dr Purvis SheffieldKoneswaran    INCREASE Enalapril  To 20 mg twice a day      Thank you for choosing [facility] !

## 2014-06-19 NOTE — Progress Notes (Signed)
Patient ID: Katherine Lane, female   DOB: 07-19-1932, 81 y.o.   MRN: [id number]      SUBJECTIVE: Katherine Lane presents for follow up of paroxysmal atrial fibrillation. Xarelto has been too costly for her but her husband had been on warfarin and she is averse to taking this medication. Several weeks ago she experienced palpitations lasting for 3 days and her blood pressure had been lower. She has also noticed a cough while lying down. She denies orthopnea and paroxysmal nocturnal dyspnea as well as chest pain. Her automated blood pressure cuff registered a heart rate in the 40 beat per minute range when she had palpitations, and her systolic blood pressure was 112. However, when she does check her blood pressure it normally runs quite high. She denies headaches and leg swelling but continues to have a bluish discoloration of her toes and experiences a burning sensation on her soles. She denies pretibial and ankle edema.   She is here with her daughter, Zella BallRobin.   Review of Systems: As per "subjective", otherwise negative.  Allergies  Allergen Reactions  . Codeine Nausea And Vomiting    Patient states intolerance to all pain medications    Current Outpatient Prescriptions  Medication Sig Dispense Refill  . Calcium Carbonate-Vitamin D (CALCIUM 500 + D PO) Take 1 tablet by mouth every morning.     . clobetasol (TEMOVATE) 0.05 % external solution APPLY TO AFFECTED AREAS TWICE DAILY. 50 mL 4  . enalapril (VASOTEC) 20 MG tablet TAKE 1/2 TABLET BY MOUTH IN THE MORNING AND ONE TABLET BY MOUTH IN THE EVENING. 45 tablet 6  . hydrochlorothiazide (HYDRODIURIL) 25 MG tablet TAKE (1) TABLET BY MOUTH EACH MORNING. 90 tablet 1  . LORazepam (ATIVAN) 0.5 MG tablet TAKE (1) TABLET DAILY AS NEEDED. 30 tablet 1  . metoprolol succinate (TOPROL XL) 25 MG 24 hr tablet Take 1 tablet (25 mg total) by mouth daily. 90 tablet 3  . ondansetron (ZOFRAN-ODT) 4 MG disintegrating tablet Take 1 tablet (4 mg total) by mouth every  6 (six) hours as needed for nausea or vomiting. 8 tablet 5  . PROCTOZONE-HC 2.5 % rectal cream APPLY RECTALLY 3 TIMES DAILY. 30 g 4  . rivaroxaban (XARELTO) 20 MG TABS tablet Take 1 tablet (20 mg total) by mouth daily with supper. 30 tablet 11  . verapamil (CALAN-SR) 240 MG CR tablet TAKE (1) TABLET BY MOUTH EACH MORNING. 90 tablet 1  . zolpidem (AMBIEN) 10 MG tablet TAKE 1 TABLET BY MOUTH AT BEDTIME FOR SLEEP. 30 tablet 5   No current facility-administered medications for this visit.    Past Medical History  Diagnosis Date  . HTN (hypertension)   . Osteoporosis   . Constipation, chronic   . PONV (postoperative nausea and vomiting)   . Arthritis   . Migraine headache   . Chronic back pain   . Depression   . Anxiety   . Insomnia     Past Surgical History  Procedure Laterality Date  . Partial hysterectomy    . Ankle fracture surgery      2010-right  . Nerve repair  07/18/2011    Procedure: NERVE REPAIR;  Surgeon: Vickki HearingStanley E Harrison, MD;  Location: [facility];  Service: Orthopedics;  Laterality: Right;  Right leg superficial peroneal nerve release    . Hardware removal  07/18/2011    Procedure: HARDWARE REMOVAL;  Surgeon: Vickki HearingStanley E Harrison, MD;  Location: [facility];  Service: Orthopedics;  Laterality: Right;  . Colonoscopy    .  Cataract extraction w/phaco Right 07/22/2012    Procedure: CATARACT EXTRACTION PHACO AND INTRAOCULAR LENS PLACEMENT (IOC);  Surgeon: Gemma Payor, MD;  Location: [facility];  Service: Ophthalmology;  Laterality: Right;  CDE:  18.93  . Cataract extraction w/phaco Left 08/09/2012    Procedure: CATARACT EXTRACTION PHACO AND INTRAOCULAR LENS PLACEMENT (IOC);  Surgeon: Gemma Payor, MD;  Location: [facility];  Service: Ophthalmology;  Laterality: Left;  CDE: 17.21    History   Social History  . Marital Status: Widowed    Spouse Name: N/A  . Number of Children: N/A  . Years of Education: N/A   Occupational History  . retired    Social History Main Topics  . Smoking status:  Never Smoker   . Smokeless tobacco: Never Used  . Alcohol Use: No  . Drug Use: No  . Sexual Activity: Yes    Birth Control/ Protection: Surgical   Other Topics Concern  . Not on file   Social History Narrative     Filed Vitals:   06/19/14 1030  BP: 160/90  Pulse: 62  Weight: 137 lb 9.6 oz (62.415 kg)  SpO2: 97%    PHYSICAL EXAM General: NAD HEENT: Normal. Neck: No JVD, no thyromegaly. Lungs: Clear to auscultation bilaterally with normal respiratory effort. CV: Nondisplaced PMI.  Regular rate and rhythm, normal S1/S2, no S3/S4, no murmur. No pretibial or periankle edema.  No carotid bruit.  Normal pedal pulses. Bluish discoloration of toes bilaterally, nontender. Abdomen: Soft, nontender, no hepatosplenomegaly, no distention.  Neurologic: Alert and oriented x 3.  Psych: Normal affect. Skin: Bluish discoloration of toes bilaterally, nontender, extending to distal sole. Musculoskeletal: Normal range of motion, no gross deformities. Extremities: No clubbing or cyanosis.   ECG: Most recent ECG reviewed.      ASSESSMENT AND PLAN: 1. Paroxysmal atrial fibrillation: Other than a spell several weeks ago, she has been symptomatically stable on Toprol-XL 25 mg and verapamil 240 mg. Xarelto costly but prefers this over warfarin. Discussed antiarrhythmic therapy as an option if palpitations become more frequent and severe, such as amiodarone. Prefers to avoid this for now.  2. Essential HTN: Markedly elevated on current therapy which includes HCTZ and enalapril. Will increase enalapril to 20 mg bid.  3. Cough: As it occurs in the supine position, I wonder if she has GERD. I explained this to the patient and her daughter.  4. Toe discoloration: No PVD by ABI's. Has acrocyanosis. Could consider low-dose amlodipine in future for possible Raynaud's-type phenomenon.  Dispo: f/u August.  Prentice Docker, M.D., F.A.C.C.

## 2014-07-13 ENCOUNTER — Other Ambulatory Visit: Payer: Self-pay | Admitting: Family Medicine

## 2014-07-24 ENCOUNTER — Other Ambulatory Visit: Payer: Self-pay | Admitting: Cardiovascular Disease

## 2014-08-16 ENCOUNTER — Other Ambulatory Visit: Payer: Self-pay | Admitting: Family Medicine

## 2014-08-16 NOTE — Telephone Encounter (Signed)
Ok six mo 

## 2014-08-28 ENCOUNTER — Other Ambulatory Visit: Payer: Self-pay | Admitting: Family Medicine

## 2014-09-14 ENCOUNTER — Other Ambulatory Visit: Payer: Self-pay | Admitting: Family Medicine

## 2014-09-14 NOTE — Telephone Encounter (Signed)
Ok six mo worth 

## 2014-09-14 NOTE — Telephone Encounter (Signed)
Last seen 3.15.16.

## 2014-10-16 ENCOUNTER — Encounter: Payer: Self-pay | Admitting: Cardiovascular Disease

## 2014-10-16 ENCOUNTER — Ambulatory Visit (INDEPENDENT_AMBULATORY_CARE_PROVIDER_SITE_OTHER): Payer: Medicare Other | Admitting: Cardiovascular Disease

## 2014-10-16 VITALS — BP 132/72 | HR 78 | Ht 63.0 in | Wt 135.4 lb

## 2014-10-16 DIAGNOSIS — H8113 Benign paroxysmal vertigo, bilateral: Secondary | ICD-10-CM

## 2014-10-16 DIAGNOSIS — R42 Dizziness and giddiness: Secondary | ICD-10-CM | POA: Diagnosis not present

## 2014-10-16 DIAGNOSIS — I4891 Unspecified atrial fibrillation: Secondary | ICD-10-CM

## 2014-10-16 DIAGNOSIS — I480 Paroxysmal atrial fibrillation: Secondary | ICD-10-CM

## 2014-10-16 DIAGNOSIS — I10 Essential (primary) hypertension: Secondary | ICD-10-CM

## 2014-10-16 MED ORDER — MECLIZINE HCL 50 MG PO TABS: 50.0000 mg | ORAL_TABLET | ORAL | Status: DC | PRN

## 2014-10-16 NOTE — Patient Instructions (Addendum)
Your physician wants you to follow-up in: 6 months with Dr. Purvis Sheffield. You will receive a reminder letter in the mail two months in advance. If you don't receive a letter, please call our office to schedule the follow-up appointment.  Your physician recommends that you continue on your current medications as directed. Please refer to the Current Medication list given to you today.  Start Meclizine 50 mg as needed   Your physician recommends that you return for lab work today  Thank you for choosing [facility]!

## 2014-10-16 NOTE — Progress Notes (Signed)
Patient ID: Katherine Lane, female   DOB: 05/08/1932, 81 y.o.   MRN: [id number]      SUBJECTIVE:  The patient returns for follow-up of paroxysmal atrial fibrillation. Seldom has palpitations. BP has been well controlled. Has been having dizziness with intermittent nausea with change of head position for three days. Took Dramamine without significant relief. Denies vomiting and syncope.  Here with her daughter, [name].  Requests blood tests.  ECG today shows sinus bradycardia, 57 bpm.   Review of Systems: As per "subjective", otherwise negative.  Allergies  Allergen Reactions  . Codeine Nausea And Vomiting    Patient states intolerance to all pain medications    Current Outpatient Prescriptions  Medication Sig Dispense Refill  . clobetasol (TEMOVATE) 0.05 % external solution APPLY TO AFFECTED AREAS TWICE DAILY. 50 mL 4  . enalapril (VASOTEC) 20 MG tablet Take 1 tablet (20 mg total) by mouth 2 (two) times daily. 180 tablet 3  . LORazepam (ATIVAN) 0.5 MG tablet TAKE (1) TABLET DAILY AS NEEDED. 30 tablet 5  . metoprolol succinate (TOPROL-XL) 25 MG 24 hr tablet TAKE ONE TABLET BY MOUTH ONCE DAILY. 90 tablet 3  . ondansetron (ZOFRAN-ODT) 4 MG disintegrating tablet Take 1 tablet (4 mg total) by mouth every 6 (six) hours as needed for nausea or vomiting. 8 tablet 5  . PROCTOZONE-HC 2.5 % rectal cream APPLY RECTALLY 3 TIMES DAILY. 30 g 5  . rivaroxaban (XARELTO) 20 MG TABS tablet Take 1 tablet (20 mg total) by mouth daily with supper. 30 tablet 11  . verapamil (CALAN-SR) 240 MG CR tablet TAKE (1) TABLET BY MOUTH EACH MORNING. 90 tablet 0  . zolpidem (AMBIEN) 10 MG tablet TAKE 1 TABLET BY MOUTH AT BEDTIME FOR SLEEP. 30 tablet 5  . hydrochlorothiazide (HYDRODIURIL) 25 MG tablet TAKE (1) TABLET BY MOUTH EACH MORNING. (Patient not taking: Reported on 10/16/2014) 90 tablet 1   No current facility-administered medications for this visit.    Past Medical History  Diagnosis Date  . HTN  (hypertension)   . Osteoporosis   . Constipation, chronic   . PONV (postoperative nausea and vomiting)   . Arthritis   . Migraine headache   . Chronic back pain   . Depression   . Anxiety   . Insomnia     Past Surgical History  Procedure Laterality Date  . Partial hysterectomy    . Ankle fracture surgery      2010-right  . Nerve repair  07/18/2011    Procedure: NERVE REPAIR;  Surgeon: Vickki Hearing, MD;  Location: [facility];  Service: Orthopedics;  Laterality: Right;  Right leg superficial peroneal nerve release    . Hardware removal  07/18/2011    Procedure: HARDWARE REMOVAL;  Surgeon: Vickki Hearing, MD;  Location: [facility];  Service: Orthopedics;  Laterality: Right;  . Colonoscopy    . Cataract extraction w/phaco Right 07/22/2012    Procedure: CATARACT EXTRACTION PHACO AND INTRAOCULAR LENS PLACEMENT (IOC);  Surgeon: Gemma Payor, MD;  Location: [facility];  Service: Ophthalmology;  Laterality: Right;  CDE:  18.93  . Cataract extraction w/phaco Left 08/09/2012    Procedure: CATARACT EXTRACTION PHACO AND INTRAOCULAR LENS PLACEMENT (IOC);  Surgeon: Gemma Payor, MD;  Location: [facility];  Service: Ophthalmology;  Laterality: Left;  CDE: 17.21    Social History   Social History  . Marital Status: Widowed    Spouse Name: N/A  . Number of Children: N/A  . Years of Education: N/A   Occupational  History  . retired    Social History Main Topics  . Smoking status: Never Smoker   . Smokeless tobacco: Never Used  . Alcohol Use: No  . Drug Use: No  . Sexual Activity: Yes    Birth Control/ Protection: Surgical   Other Topics Concern  . Not on file   Social History Narrative     Filed Vitals:   10/16/14 1641  BP: 132/72  Pulse: 78  Height: 5\' 3"  (1.6 m)  Weight: 135 lb 6.4 oz (61.417 kg)  SpO2: 95%    PHYSICAL EXAM General: NAD HEENT: Normal. Neck: No JVD, no thyromegaly. Lungs: Clear to auscultation bilaterally with normal respiratory effort. CV: Nondisplaced PMI.  Regular rate and rhythm, normal S1/S2, no S3/S4, no murmur. No pretibial or periankle edema. No carotid bruit. Normal pedal pulses. Bluish discoloration of toes bilaterally, nontender. Abdomen: Soft, nontender, no hepatosplenomegaly, no distention.  Neurologic: Alert and oriented x 3.  Psych: Normal affect. Skin: Bluish discoloration of toes bilaterally, nontender, extending to distal sole. Musculoskeletal: Normal range of motion, no gross deformities. Extremities: No clubbing or cyanosis.    ECG: Most recent ECG reviewed.      ASSESSMENT AND PLAN: 1. Paroxysmal atrial fibrillation: Symptomatically stable on Toprol-XL 25 mg and verapamil 240 mg. Continue Xarelto. Check CBC.  2. Essential HTN: Controlled with increase of enalapril to 20 mg bid. Check BMET. No changes to therapy.  3. Toe discoloration: No PVD by ABI's. Has acrocyanosis. Could consider low-dose amlodipine in future for possible Raynaud's-type phenomenon.  4. Dizziness: Symptoms consistent with benign positional vertigo. Educated on Ec[employer]. Will prescribe meclizine prn. Check CBC and BMET.  Dispo: f/u 6 months.   Prentice Docker, M.D., F.A.C.C.

## 2014-10-18 ENCOUNTER — Other Ambulatory Visit: Payer: Self-pay | Admitting: Cardiovascular Disease

## 2014-10-18 LAB — BASIC METABOLIC PANEL
BUN: 13 mg/dL (ref 7–25)
CO2: 31 mmol/L (ref 20–31)
Calcium: 9.8 mg/dL (ref 8.6–10.4)
Chloride: 103 mmol/L (ref 98–110)
Creat: 0.9 mg/dL (ref 0.60–0.93)
Glucose, Bld: 78 mg/dL (ref 65–99)
Potassium: 4.1 mmol/L (ref 3.5–5.3)
Sodium: 142 mmol/L (ref 135–146)

## 2014-10-18 LAB — CBC WITH DIFFERENTIAL/PLATELET
Basophils Absolute: 0 10*3/uL (ref 0.0–0.1)
Basophils Relative: 0 % (ref 0–1)
Eosinophils Absolute: 0 10*3/uL (ref 0.0–0.7)
Eosinophils Relative: 1 % (ref 0–5)
HCT: 41 % (ref 36.0–46.0)
Hemoglobin: 13.7 g/dL (ref 12.0–15.0)
Lymphocytes Relative: 54 % — ABNORMAL HIGH (ref 12–46)
Lymphs Abs: 2.3 10*3/uL (ref 0.7–4.0)
MCH: 30 pg (ref 26.0–34.0)
MCHC: 33.4 g/dL (ref 30.0–36.0)
MCV: 89.7 fL (ref 78.0–100.0)
MPV: 10 fL (ref 8.6–12.4)
Monocytes Absolute: 0.3 10*3/uL (ref 0.1–1.0)
Monocytes Relative: 7 % (ref 3–12)
Neutro Abs: 1.6 10*3/uL — ABNORMAL LOW (ref 1.7–7.7)
Neutrophils Relative %: 38 % — ABNORMAL LOW (ref 43–77)
Platelets: 305 10*3/uL (ref 150–400)
RBC: 4.57 MIL/uL (ref 3.87–5.11)
RDW: 14.2 % (ref 11.5–15.5)
WBC: 4.3 10*3/uL (ref 4.0–10.5)

## 2014-10-23 ENCOUNTER — Emergency Department (HOSPITAL_COMMUNITY)
Admission: EM | Admit: 2014-10-23 | Discharge: 2014-10-24 | Disposition: A | Discharge status: Home / Self Care | Payer: Medicare Other | Attending: Emergency Medicine | Admitting: Emergency Medicine

## 2014-10-23 ENCOUNTER — Encounter (HOSPITAL_COMMUNITY): Payer: Self-pay | Admitting: *Deleted

## 2014-10-23 DIAGNOSIS — G43909 Migraine, unspecified, not intractable, without status migrainosus: Secondary | ICD-10-CM | POA: Diagnosis not present

## 2014-10-23 DIAGNOSIS — R002 Palpitations: Secondary | ICD-10-CM

## 2014-10-23 DIAGNOSIS — R0602 Shortness of breath: Secondary | ICD-10-CM | POA: Diagnosis not present

## 2014-10-23 DIAGNOSIS — R079 Chest pain, unspecified: Secondary | ICD-10-CM

## 2014-10-23 DIAGNOSIS — R5383 Other fatigue: Secondary | ICD-10-CM | POA: Insufficient documentation

## 2014-10-23 DIAGNOSIS — Z9889 Other specified postprocedural states: Secondary | ICD-10-CM | POA: Insufficient documentation

## 2014-10-23 DIAGNOSIS — Z7952 Long term (current) use of systemic steroids: Secondary | ICD-10-CM | POA: Diagnosis not present

## 2014-10-23 DIAGNOSIS — Z79899 Other long term (current) drug therapy: Secondary | ICD-10-CM | POA: Diagnosis not present

## 2014-10-23 DIAGNOSIS — Z8719 Personal history of other diseases of the digestive system: Secondary | ICD-10-CM | POA: Diagnosis not present

## 2014-10-23 DIAGNOSIS — Z7901 Long term (current) use of anticoagulants: Secondary | ICD-10-CM | POA: Diagnosis not present

## 2014-10-23 DIAGNOSIS — G4700 Insomnia, unspecified: Secondary | ICD-10-CM | POA: Insufficient documentation

## 2014-10-23 DIAGNOSIS — I10 Essential (primary) hypertension: Secondary | ICD-10-CM | POA: Insufficient documentation

## 2014-10-23 DIAGNOSIS — Z8739 Personal history of other diseases of the musculoskeletal system and connective tissue: Secondary | ICD-10-CM | POA: Diagnosis not present

## 2014-10-23 DIAGNOSIS — G8929 Other chronic pain: Secondary | ICD-10-CM | POA: Insufficient documentation

## 2014-10-23 DIAGNOSIS — F419 Anxiety disorder, unspecified: Secondary | ICD-10-CM | POA: Insufficient documentation

## 2014-10-23 NOTE — ED Notes (Signed)
Pt brought in by [facility] for c/o chest heaviness and pressure that radiates to bilateral arms; pt was given one 0.4mg  nitroglycerin by ems; pt states she felt "weird" and felt like her heart was fluttering

## 2014-10-24 ENCOUNTER — Emergency Department (HOSPITAL_COMMUNITY): Payer: Medicare Other

## 2014-10-24 LAB — BASIC METABOLIC PANEL
Anion gap: 7 (ref 5–15)
BUN: 16 mg/dL (ref 6–20)
CO2: 27 mmol/L (ref 22–32)
Calcium: 9.7 mg/dL (ref 8.9–10.3)
Chloride: 107 mmol/L (ref 101–111)
Creatinine, Ser: 0.91 mg/dL (ref 0.44–1.00)
GFR calc Af Amer: >60 mL/min (ref >60)
GFR calc non Af Amer: 58 mL/min — ABNORMAL LOW (ref >60)
Glucose, Bld: 117 mg/dL — ABNORMAL HIGH (ref 65–99)
Potassium: 3.5 mmol/L (ref 3.5–5.1)
Sodium: 141 mmol/L (ref 135–145)

## 2014-10-24 LAB — CBC
HCT: 38.9 % (ref 36.0–46.0)
Hemoglobin: 13.1 g/dL (ref 12.0–15.0)
MCH: 30.4 pg (ref 26.0–34.0)
MCHC: 33.7 g/dL (ref 30.0–36.0)
MCV: 90.3 fL (ref 78.0–100.0)
Platelets: 239 10*3/uL (ref 150–400)
RBC: 4.31 MIL/uL (ref 3.87–5.11)
RDW: 13.1 % (ref 11.5–15.5)
WBC: 5.1 10*3/uL (ref 4.0–10.5)

## 2014-10-24 LAB — TROPONIN I
Troponin I: <0.03 ng/mL (ref <0.031)
Troponin I: <0.03 ng/mL (ref <0.031)

## 2014-10-24 NOTE — Discharge Instructions (Signed)
Go home and rest. Call Dr Sharene Skeans office this morning to let him know you had to come to the ED. Return if you feel worse again.

## 2014-10-24 NOTE — ED Notes (Signed)
Patient walked to restroom with standby assistance. BP taken upon returning to room. BP elevated. Patient states that she feels some flutter

## 2014-10-24 NOTE — ED Provider Notes (Signed)
CSN: [id number]     Arrival date & time 10/23/14  2349 History   First MD Initiated Contact with Patient 10/24/14 0100    Chief Complaint  Patient presents with  . Chest Pain     (Consider location/radiation/quality/duration/timing/severity/associated sxs/prior Treatment) HPI patient reports about 10:30 PM tonight she was in bed and she got up to go to the bathroom and she had acute onset of pressure in the left side of her chest described as a heaviness. She also felt like her heart was beating fast and beating hard. States she felt very short of breath. She denies diaphoresis, nausea, or vomiting. She states now she just feels fatigued. She states she has had similar symptoms with atrial fib in the past however she has never had the chest discomfort like it was tonight. She states the symptoms lasted about an hour. She denies any change in her activity today. She states with EMS arrived she was given nitroglycerin 0.4 mg and she was told her blood pressure was over 200.  Patient was seen last week by her cardiologist and had EKG done that showed normal sinus rhythm. Patient states she's never had to have cardioversion for her atrial fibrillation. She states she had a cardiac cath several years ago possibly 5 that was normal. She is taking xarelto.   PCP Dr Gerda Diss Cardiology Dr Darl Householder  Past Medical History  Diagnosis Date  . HTN (hypertension)   . Osteoporosis   . Constipation, chronic   . PONV (postoperative nausea and vomiting)   . Arthritis   . Migraine headache   . Chronic back pain   . Depression   . Anxiety   . Insomnia    Past Surgical History  Procedure Laterality Date  . Partial hysterectomy    . Ankle fracture surgery      2010-right  . Nerve repair  07/18/2011    Procedure: NERVE REPAIR;  Surgeon: Vickki Hearing, MD;  Location: [facility];  Service: Orthopedics;  Laterality: Right;  Right leg superficial peroneal nerve release    . Hardware removal  07/18/2011   Procedure: HARDWARE REMOVAL;  Surgeon: Vickki Hearing, MD;  Location: [facility];  Service: Orthopedics;  Laterality: Right;  . Colonoscopy    . Cataract extraction w/phaco Right 07/22/2012    Procedure: CATARACT EXTRACTION PHACO AND INTRAOCULAR LENS PLACEMENT (IOC);  Surgeon: Gemma Payor, MD;  Location: [facility];  Service: Ophthalmology;  Laterality: Right;  CDE:  18.93  . Cataract extraction w/phaco Left 08/09/2012    Procedure: CATARACT EXTRACTION PHACO AND INTRAOCULAR LENS PLACEMENT (IOC);  Surgeon: Gemma Payor, MD;  Location: [facility];  Service: Ophthalmology;  Laterality: Left;  CDE: 17.21   Family History  Problem Relation Age of Onset  . Alzheimer's disease    . Cancer    . Anesthesia problems Neg Hx   . Hypotension Neg Hx   . Malignant hyperthermia Neg Hx   . Pseudochol deficiency Neg Hx   . Hypertension Mother   . Hypertension Father    Social History  Substance Use Topics  . Smoking status: Never Smoker   . Smokeless tobacco: Never Used  . Alcohol Use: No   Lives at home Lives alone  OB History    No data available     Review of Systems  All other systems reviewed and are negative.     Allergies  Codeine  Home Medications   Prior to Admission medications   Medication Sig Start Date End Date Taking?  Authorizing Provider  clobetasol (TEMOVATE) 0.05 % external solution APPLY TO AFFECTED AREAS TWICE DAILY. 09/30/13   Babs Sciara, MD  enalapril (VASOTEC) 20 MG tablet Take 1 tablet (20 mg total) by mouth 2 (two) times daily. 06/19/14   Laqueta Linden, MD  hydrochlorothiazide (HYDRODIURIL) 25 MG tablet TAKE (1) TABLET BY MOUTH EACH MORNING. Patient not taking: Reported on 10/16/2014 05/02/14   Merlyn Albert, MD  LORazepam (ATIVAN) 0.5 MG tablet TAKE (1) TABLET DAILY AS NEEDED. 09/14/14   Merlyn Albert, MD  meclizine (ANTIVERT) 50 MG tablet Take 1 tablet (50 mg total) by mouth as needed for dizziness. 10/16/14   Laqueta Linden, MD  metoprolol succinate  (TOPROL-XL) 25 MG 24 hr tablet TAKE ONE TABLET BY MOUTH ONCE DAILY. 07/24/14   Laqueta Linden, MD  ondansetron (ZOFRAN-ODT) 4 MG disintegrating tablet Take 1 tablet (4 mg total) by mouth every 6 (six) hours as needed for nausea or vomiting. 11/01/13   Merlyn Albert, MD  PROCTOZONE-HC 2.5 % rectal cream APPLY RECTALLY 3 TIMES DAILY. 07/13/14   Merlyn Albert, MD  rivaroxaban (XARELTO) 20 MG TABS tablet Take 1 tablet (20 mg total) by mouth daily with supper. 03/01/14   Laqueta Linden, MD  verapamil (CALAN-SR) 240 MG CR tablet TAKE (1) TABLET BY MOUTH EACH MORNING. 08/28/14   Merlyn Albert, MD  zolpidem (AMBIEN) 10 MG tablet TAKE 1 TABLET BY MOUTH AT BEDTIME FOR SLEEP. 08/16/14   Merlyn Albert, MD   BP 197/80 mmHg  Pulse 61  Temp(Src) 98.2 F (36.8 C) (Oral)  Resp 16  Ht 5\' 2"  (1.575 m)  Wt 135 lb (61.236 kg)  BMI 24.69 kg/m2  SpO2 99%  Vital signs normal except for hypertension  Physical Exam  Constitutional: She is oriented to person, place, and time. She appears well-developed and well-nourished.  Non-toxic appearance. She does not appear ill. No distress.  HENT:  Head: Normocephalic and atraumatic.  Right Ear: External ear normal.  Left Ear: External ear normal.  Nose: Nose normal. No mucosal edema or rhinorrhea.  Mouth/Throat: Oropharynx is clear and moist and mucous membranes are normal. No dental abscesses or uvula swelling.  Eyes: Conjunctivae and EOM are normal. Pupils are equal, round, and reactive to light.  Neck: Normal range of motion and full passive range of motion without pain. Neck supple.  Cardiovascular: Normal rate, regular rhythm and normal heart sounds.  Exam reveals no gallop and no friction rub.   No murmur heard. Pulmonary/Chest: Effort normal and breath sounds normal. No respiratory distress. She has no wheezes. She has no rhonchi. She has no rales. She exhibits no tenderness and no crepitus.  Abdominal: Soft. Normal appearance and bowel sounds are  normal. She exhibits no distension. There is no tenderness. There is no rebound and no guarding.  Musculoskeletal: Normal range of motion. She exhibits no edema or tenderness.  Moves all extremities well.   Neurological: She is alert and oriented to person, place, and time. She has normal strength. No cranial nerve deficit.  Skin: Skin is warm, dry and intact. No rash noted. No erythema. No pallor.  Psychiatric: She has a normal mood and affect. Her speech is normal and behavior is normal. Her mood appears not anxious.  Nursing note and vitals reviewed.   ED Course  Procedures (including critical care time)  Patient had no further symptoms while in the ED. She had 2 negative troponins, the last one was 4 hours after  onset of her symptoms. She is being discharged to follow up with her cardiologist this week.  Labs Review Results for orders placed or performed during the hospital encounter of 10/23/14  Basic metabolic panel  Result Value Ref Range   Sodium 141 135 - 145 mmol/L   Potassium 3.5 3.5 - 5.1 mmol/L   Chloride 107 101 - 111 mmol/L   CO2 27 22 - 32 mmol/L   Glucose, Bld 117 (H) 65 - 99 mg/dL   BUN 16 6 - 20 mg/dL   Creatinine, Ser 1.61 0.44 - 1.00 mg/dL   Calcium 9.7 8.9 - 09.6 mg/dL   GFR calc non Af Amer 58 (L) >60 mL/min   GFR calc Af Amer >60 >60 mL/min   Anion gap 7 5 - 15  CBC  Result Value Ref Range   WBC 5.1 4.0 - 10.5 K/uL   RBC 4.31 3.87 - 5.11 MIL/uL   Hemoglobin 13.1 12.0 - 15.0 g/dL   HCT 04.5 40.9 - 81.1 %   MCV 90.3 78.0 - 100.0 fL   MCH 30.4 26.0 - 34.0 pg   MCHC 33.7 30.0 - 36.0 g/dL   RDW 91.4 78.2 - 95.6 %   Platelets 239 150 - 400 K/uL  Troponin I  Result Value Ref Range   Troponin I <0.03 <0.031 ng/mL  Troponin I  Result Value Ref Range   Troponin I <0.03 <0.031 ng/mL   Laboratory interpretation all normal      Imaging Review Dg Chest 2 View  10/24/2014   CLINICAL DATA:  Chest heaviness and pressure radiating to the arms. Started prior  to arrival.  EXAM: CHEST  2 VIEW  COMPARISON:  05/11/2013  FINDINGS: Normal heart size and pulmonary vascularity. Calcification in the right mid lung. No focal airspace disease or consolidation in the lungs. No blunting of costophrenic angles. No pneumothorax. Mediastinal contours appear intact. Calcified aorta. Degenerative changes in the spine with mild anterior compression of a lower thoracic vertebra. Compression appears to been present on previous CT studies and is likely chronic.  IMPRESSION: No active cardiopulmonary disease.   Electronically Signed   By: Burman Nieves M.D.   On: 10/24/2014 01:36   I have personally reviewed and evaluated these images and lab results as part of my medical decision-making.   EKG Interpretation   Date/Time:  Tuesday October 24 2014 00:02:13 EDT Ventricular Rate:  68 PR Interval:  195 QRS Duration: 91 QT Interval:  421 QTC Calculation: 448 R Axis:   65 Text Interpretation:  Sinus rhythm Low voltage, precordial leads Since  last tracing rate slower (11 May 2013) Confirmed by Marnae Madani  MD-I, Trayven Lumadue  ([id number]) on 10/24/2014 12:34:15 AM      MDM   Final diagnoses:  Palpitations  Chest pain, unspecified chest pain type    Plan discharge  Devoria Albe, MD, Concha Pyo, MD 10/24/14 [phone]

## 2014-10-25 ENCOUNTER — Encounter: Payer: Medicare Other | Admitting: Physician Assistant

## 2014-11-24 ENCOUNTER — Ambulatory Visit (INDEPENDENT_AMBULATORY_CARE_PROVIDER_SITE_OTHER): Payer: Medicare Other | Admitting: Cardiovascular Disease

## 2014-11-24 ENCOUNTER — Encounter: Payer: Self-pay | Admitting: Cardiovascular Disease

## 2014-11-24 VITALS — BP 138/98 | HR 77 | Ht 63.0 in | Wt 137.0 lb

## 2014-11-24 DIAGNOSIS — I480 Paroxysmal atrial fibrillation: Secondary | ICD-10-CM | POA: Diagnosis not present

## 2014-11-24 DIAGNOSIS — Z23 Encounter for immunization: Secondary | ICD-10-CM

## 2014-11-24 DIAGNOSIS — Z9289 Personal history of other medical treatment: Secondary | ICD-10-CM

## 2014-11-24 DIAGNOSIS — H8113 Benign paroxysmal vertigo, bilateral: Secondary | ICD-10-CM

## 2014-11-24 DIAGNOSIS — R002 Palpitations: Secondary | ICD-10-CM

## 2014-11-24 DIAGNOSIS — Z87898 Personal history of other specified conditions: Secondary | ICD-10-CM

## 2014-11-24 DIAGNOSIS — I10 Essential (primary) hypertension: Secondary | ICD-10-CM

## 2014-11-24 MED ORDER — METOPROLOL TARTRATE 25 MG PO TABS: ORAL_TABLET | ORAL | Status: DC

## 2014-11-24 NOTE — Patient Instructions (Signed)
Your physician wants you to follow-up in: 3 months with Dr Reggy Eye will receive a reminder letter in the mail two months in advance. If you don't receive a letter, please call our office to schedule the follow-up appointment.   Take lopressor 25 mg daily as needed for severe palpitations      Thank you for choosing [facility] !

## 2014-11-24 NOTE — Progress Notes (Signed)
Patient ID: Katherine Lane, female   DOB: 10/03/1932, 81 y.o.   MRN: [id number]      SUBJECTIVE: The patient returns for follow-up after being evaluated in the ED for chest pain and palpitations on 9/6. She has a history of paroxysmal atrial fibrillation, essential hypertension, and benign positional vertigo.  Symptoms reportedly lasted an hour. Troponins, CBC, and chest x-ray were all normal. ECG demonstrated normal sinus rhythm.  Her blood pressure has been fluctuating with occasional readings of 150/90's range. However there are times when the systolic is in the 110-120 range. She has avoided taking her evening dose of enalapril when this has occurred. She has had 2 more episodes of palpitations lasting approximately an hour and did not tell her daughter nor call anyone else. She has been a bit more anxious lately as one of her good friends has traveled out of state.   Review of Systems: As per "subjective", otherwise negative.  Allergies  Allergen Reactions  . Codeine Nausea And Vomiting    Patient states intolerance to all pain medications    Current Outpatient Prescriptions  Medication Sig Dispense Refill  . clobetasol (TEMOVATE) 0.05 % external solution APPLY TO AFFECTED AREAS TWICE DAILY. 50 mL 4  . enalapril (VASOTEC) 20 MG tablet Take 1 tablet (20 mg total) by mouth 2 (two) times daily. 180 tablet 3  . hydrochlorothiazide (HYDRODIURIL) 25 MG tablet TAKE (1) TABLET BY MOUTH EACH MORNING. 90 tablet 1  . LORazepam (ATIVAN) 0.5 MG tablet TAKE (1) TABLET DAILY AS NEEDED. 30 tablet 5  . meclizine (ANTIVERT) 50 MG tablet Take 1 tablet (50 mg total) by mouth as needed for dizziness. 30 tablet 0  . metoprolol succinate (TOPROL-XL) 25 MG 24 hr tablet TAKE ONE TABLET BY MOUTH ONCE DAILY. 90 tablet 3  . ondansetron (ZOFRAN-ODT) 4 MG disintegrating tablet Take 1 tablet (4 mg total) by mouth every 6 (six) hours as needed for nausea or vomiting. 8 tablet 5  . PROCTOZONE-HC 2.5 % rectal cream  APPLY RECTALLY 3 TIMES DAILY. 30 g 5  . rivaroxaban (XARELTO) 20 MG TABS tablet Take 1 tablet (20 mg total) by mouth daily with supper. 30 tablet 11  . verapamil (CALAN-SR) 240 MG CR tablet TAKE (1) TABLET BY MOUTH EACH MORNING. 90 tablet 0  . zolpidem (AMBIEN) 10 MG tablet TAKE 1 TABLET BY MOUTH AT BEDTIME FOR SLEEP. 30 tablet 5   No current facility-administered medications for this visit.    Past Medical History  Diagnosis Date  . HTN (hypertension)   . Osteoporosis   . Constipation, chronic   . PONV (postoperative nausea and vomiting)   . Arthritis   . Migraine headache   . Chronic back pain   . Depression   . Anxiety   . Insomnia     Past Surgical History  Procedure Laterality Date  . Partial hysterectomy    . Ankle fracture surgery      2010-right  . Nerve repair  07/18/2011    Procedure: NERVE REPAIR;  Surgeon: Vickki Hearing, MD;  Location: [facility];  Service: Orthopedics;  Laterality: Right;  Right leg superficial peroneal nerve release    . Hardware removal  07/18/2011    Procedure: HARDWARE REMOVAL;  Surgeon: Vickki Hearing, MD;  Location: [facility];  Service: Orthopedics;  Laterality: Right;  . Colonoscopy    . Cataract extraction w/phaco Right 07/22/2012    Procedure: CATARACT EXTRACTION PHACO AND INTRAOCULAR LENS PLACEMENT (IOC);  Surgeon: Gemma Payor, MD;  Location: [facility];  Service: Ophthalmology;  Laterality: Right;  CDE:  18.93  . Cataract extraction w/phaco Left 08/09/2012    Procedure: CATARACT EXTRACTION PHACO AND INTRAOCULAR LENS PLACEMENT (IOC);  Surgeon: Gemma Payor, MD;  Location: [facility];  Service: Ophthalmology;  Laterality: Left;  CDE: 17.21    Social History   Social History  . Marital Status: Widowed    Spouse Name: N/A  . Number of Children: N/A  . Years of Education: N/A   Occupational History  . retired    Social History Main Topics  . Smoking status: Never Smoker   . Smokeless tobacco: Never Used  . Alcohol Use: No  . Drug Use: No  .  Sexual Activity: Yes    Birth Control/ Protection: Surgical   Other Topics Concern  . Not on file   Social History Narrative     Filed Vitals:   11/24/14 0829  BP: 138/98  Pulse: 77  Height:  (1.6 m)  Weight: 137 lb (62.143 kg)  SpO2: 97%    PHYSICAL EXAM General: NAD HEENT: Normal. Neck: No JVD, no thyromegaly. Lungs: Clear to auscultation bilaterally with normal respiratory effort. CV: Nondisplaced PMI.  Regular rate and rhythm, normal S1/S2, no S3/S4, no murmur. No pretibial or periankle edema.  Abdomen: Soft, nontender, no distention.  Neurologic: Alert and oriented x 3.  Psych: Normal affect. Skin: Normal. Musculoskeletal: Normal range of motion, no gross deformities. Extremities: No clubbing or cyanosis.   ECG: Most recent ECG reviewed.      ASSESSMENT AND PLAN: 1. Paroxysmal atrial fibrillation: Recent palpitations/exacerbations on Toprol-XL 25 mg and verapamil 240 mg. Will prescribe metoprolol tartrate 25 mg to be taken as needed for sustained palpitations lasting longer than 15 minutes. Continue Xarelto.   2. Essential HTN: Mildly elevated today. Will monitor.  3. Toe discoloration: No PVD by ABI's. Has acrocyanosis. Could consider low-dose amlodipine in future for possible Raynaud's-type phenomenon, particularly if BP remains elevated.  4. Dizziness: Symptoms consistent with benign positional vertigo. Previously educated on Epley maneuvers and prescribed meclizine prn.   Dispo: f/u 3 months.   Prentice Docker, M.D., F.A.C.C.

## 2014-12-28 ENCOUNTER — Ambulatory Visit (INDEPENDENT_AMBULATORY_CARE_PROVIDER_SITE_OTHER): Payer: Medicare Other | Admitting: Otolaryngology

## 2014-12-28 DIAGNOSIS — H9011 Conductive hearing loss, unilateral, right ear, with unrestricted hearing on the contralateral side: Secondary | ICD-10-CM | POA: Diagnosis not present

## 2014-12-28 DIAGNOSIS — H6121 Impacted cerumen, right ear: Secondary | ICD-10-CM

## 2015-01-25 ENCOUNTER — Ambulatory Visit (INDEPENDENT_AMBULATORY_CARE_PROVIDER_SITE_OTHER): Payer: Medicare Other | Admitting: Otolaryngology

## 2015-01-25 DIAGNOSIS — H6983 Other specified disorders of Eustachian tube, bilateral: Secondary | ICD-10-CM | POA: Diagnosis not present

## 2015-01-30 ENCOUNTER — Other Ambulatory Visit: Payer: Self-pay | Admitting: Family Medicine

## 2015-01-30 NOTE — Telephone Encounter (Signed)
6 mo worth  

## 2015-02-01 ENCOUNTER — Other Ambulatory Visit: Payer: Self-pay | Admitting: Family Medicine

## 2015-02-01 NOTE — Telephone Encounter (Signed)
Six mo ok 

## 2015-02-22 ENCOUNTER — Telehealth: Payer: Self-pay | Admitting: Family Medicine

## 2015-02-22 ENCOUNTER — Other Ambulatory Visit: Payer: Self-pay | Admitting: Family Medicine

## 2015-02-22 ENCOUNTER — Other Ambulatory Visit: Payer: Self-pay | Admitting: *Deleted

## 2015-02-22 MED ORDER — VERAPAMIL HCL ER 240 MG PO TBCR: EXTENDED_RELEASE_TABLET | ORAL | Status: DC

## 2015-02-22 NOTE — Telephone Encounter (Signed)
Since already January three mo worth and let pt know needs appt in march, not sure which med requested

## 2015-02-22 NOTE — Telephone Encounter (Signed)
[facility]. 30 day supply sent to pharm. Pt needs ov. Last seen march 2016.

## 2015-02-22 NOTE — Telephone Encounter (Signed)
Pt is needing refills on her verapamil (CALAN-SR) 240 MG CR tablet.     [facility]

## 2015-02-22 NOTE — Telephone Encounter (Signed)
Last seen March 2016. May we refill?

## 2015-02-23 ENCOUNTER — Ambulatory Visit: Payer: Medicare Other | Admitting: Family Medicine

## 2015-02-25 ENCOUNTER — Emergency Department (HOSPITAL_COMMUNITY)
Admission: EM | Admit: 2015-02-25 | Discharge: 2015-02-25 | Disposition: A | Discharge status: Home / Self Care | Payer: Medicare Other | Attending: Emergency Medicine | Admitting: Emergency Medicine

## 2015-02-25 ENCOUNTER — Encounter (HOSPITAL_COMMUNITY): Payer: Self-pay | Admitting: Emergency Medicine

## 2015-02-25 DIAGNOSIS — G4700 Insomnia, unspecified: Secondary | ICD-10-CM | POA: Insufficient documentation

## 2015-02-25 DIAGNOSIS — R0981 Nasal congestion: Secondary | ICD-10-CM | POA: Diagnosis not present

## 2015-02-25 DIAGNOSIS — Z8719 Personal history of other diseases of the digestive system: Secondary | ICD-10-CM | POA: Insufficient documentation

## 2015-02-25 DIAGNOSIS — H9201 Otalgia, right ear: Secondary | ICD-10-CM

## 2015-02-25 DIAGNOSIS — Z792 Long term (current) use of antibiotics: Secondary | ICD-10-CM | POA: Insufficient documentation

## 2015-02-25 DIAGNOSIS — Z7952 Long term (current) use of systemic steroids: Secondary | ICD-10-CM | POA: Diagnosis not present

## 2015-02-25 DIAGNOSIS — G8929 Other chronic pain: Secondary | ICD-10-CM | POA: Insufficient documentation

## 2015-02-25 DIAGNOSIS — R51 Headache: Secondary | ICD-10-CM | POA: Diagnosis not present

## 2015-02-25 DIAGNOSIS — Z7901 Long term (current) use of anticoagulants: Secondary | ICD-10-CM | POA: Diagnosis not present

## 2015-02-25 DIAGNOSIS — M818 Other osteoporosis without current pathological fracture: Secondary | ICD-10-CM | POA: Insufficient documentation

## 2015-02-25 DIAGNOSIS — I4891 Unspecified atrial fibrillation: Secondary | ICD-10-CM | POA: Diagnosis not present

## 2015-02-25 DIAGNOSIS — I10 Essential (primary) hypertension: Secondary | ICD-10-CM | POA: Insufficient documentation

## 2015-02-25 DIAGNOSIS — Z9889 Other specified postprocedural states: Secondary | ICD-10-CM | POA: Diagnosis not present

## 2015-02-25 DIAGNOSIS — Z79899 Other long term (current) drug therapy: Secondary | ICD-10-CM | POA: Insufficient documentation

## 2015-02-25 DIAGNOSIS — F419 Anxiety disorder, unspecified: Secondary | ICD-10-CM | POA: Diagnosis not present

## 2015-02-25 HISTORY — DX: Unspecified atrial fibrillation: I48.91

## 2015-02-25 MED ORDER — ONDANSETRON HCL 4 MG PO TABS: 4.0000 mg | ORAL_TABLET | Freq: Four times a day (QID) | ORAL | Status: DC

## 2015-02-25 MED ORDER — MORPHINE SULFATE (PF) 2 MG/ML IV SOLN
2.0000 mg | Freq: Once | INTRAVENOUS | Status: AC
  Administered 2015-02-25: 2 mg via INTRAMUSCULAR
  Filled 2015-02-25: qty 1

## 2015-02-25 MED ORDER — OXYCODONE-ACETAMINOPHEN 5-325 MG PO TABS: 1.0000 | ORAL_TABLET | Freq: Four times a day (QID) | ORAL | Status: DC | PRN

## 2015-02-25 MED ORDER — CIPROFLOXACIN-DEXAMETHASONE 0.3-0.1 % OT SUSP: 4.0000 [drp] | Freq: Two times a day (BID) | OTIC | Status: DC

## 2015-02-25 MED ORDER — PROCHLORPERAZINE MALEATE 5 MG PO TABS
5.0000 mg | ORAL_TABLET | Freq: Once | ORAL | Status: AC
  Administered 2015-02-25: 5 mg via ORAL
  Filled 2015-02-25: qty 1

## 2015-02-25 NOTE — ED Notes (Signed)
Patient with no complaints at this time. Respirations even and unlabored. Skin warm/dry. Discharge instructions reviewed with patient at this time. Patient given opportunity to voice concerns/ask questions. Patient discharged at this time and left Emergency Department with steady gait.   

## 2015-02-25 NOTE — Discharge Instructions (Signed)
Saline nasal spray may be helpful for congestion. Please use Ciprodex 2 times daily until seen by Dr. Christain SacramentoEO. Please decrease your amoxicillin to 1 tablet (500 mg) 2 times daily until seen by the ear nose and throat specialist. Please see the ear nose and throat specialist in the office as sone as possible. May use Tylenol for mild pain, use Percocet for more severe pain. Use Zofran for nausea if needed. Please do not take these medications on an empty stomach. Please return to the emergency department if any changes, problems, or concerns. Earache An earache, also called otalgia, can be caused by many things. Pain from an earache can be sharp, dull, or burning. The pain may be temporary or constant. Earaches can be caused by problems with the ear, such as infection in either the middle ear or the ear canal, injury, impacted ear wax, middle ear pressure, or a foreign body in the ear. Ear pain can also result from problems in other areas. This is called referred pain. For example, pain can come from a sore throat, a tooth infection, or problems with the jaw or the joint between the jaw and the skull (temporomandibular joint, or TMJ). The cause of an earache is not always easy to identify. Watchful waiting may be appropriate for some earaches until a clear cause of the pain can be found. HOME CARE INSTRUCTIONS Watch your condition for any changes. The following actions may help to lessen any discomfort that you are feeling:  Take medicines only as directed by your health care provider. This includes ear drops.  Apply ice to your outer ear to help reduce pain.  Put ice in a plastic bag.  Place a towel between your skin and the bag.  Leave the ice on for 20 minutes, 2-3 times per day.  Do not put anything in your ear other than medicine that is prescribed by your health care provider.  Try resting in an upright position instead of lying down. This may help to reduce pressure in the middle ear and  relieve pain.  Chew gum if it helps to relieve your ear pain.  Control any allergies that you have.  Keep all follow-up visits as directed by your health care provider. This is important. SEEK MEDICAL CARE IF:  Your pain does not improve within 2 days.  You have a fever.  You have new or worsening symptoms. SEEK IMMEDIATE MEDICAL CARE IF:  You have a severe headache.  You have a stiff neck.  You have difficulty swallowing.  You have redness or swelling behind your ear.  You have drainage from your ear.  You have hearing loss.  You feel dizzy.   This information is not intended to replace advice given to you by your health care provider. Make sure you discuss any questions you have with your health care provider.   Document Released: 09/21/2003 Document Revised: 02/24/2014 Document Reviewed: 09/04/2013 Elsevier Interactive Patient Education [employer]2016 Elsevier Inc.

## 2015-02-25 NOTE — ED Notes (Signed)
PT stated she was seen in MD office on 02/23/15 and started on Amoxicillin for right ear infection. PT states increased right ear pain with no relief from tylenol.

## 2015-02-25 NOTE — ED Provider Notes (Signed)
CSN: [id number]     Arrival date & time 02/25/15  9528 History   First MD Initiated Contact with Patient 02/25/15 [phone]     Chief Complaint  Patient presents with  . Otalgia     (Consider location/radiation/quality/duration/timing/severity/associated sxs/prior Treatment) Patient is a 82 y.o. female presenting with ear pain. The history is provided by the patient and a relative.  Otalgia Location:  Right Behind ear:  No abnormality Quality:  Sharp Severity:  Moderate Onset quality:  Gradual Duration:  2 days Timing:  Intermittent Progression:  Worsening Chronicity:  Recurrent Context: not elevation change, not foreign body in ear and no water in ear   Relieved by:  Nothing Ineffective treatments: tylenol. Associated symptoms: congestion and headaches   Associated symptoms: no diarrhea, no fever, no rash, no sore throat, no tinnitus and no vomiting   Risk factors: no recent travel     Past Medical History  Diagnosis Date  . HTN (hypertension)   . Osteoporosis   . Constipation, chronic   . PONV (postoperative nausea and vomiting)   . Arthritis   . Migraine headache   . Chronic back pain   . Depression   . Anxiety   . Insomnia   . A-fib [facility])    Past Surgical History  Procedure Laterality Date  . Partial hysterectomy    . Ankle fracture surgery      2010-right  . Nerve repair  07/18/2011    Procedure: NERVE REPAIR;  Surgeon: Vickki Hearing, MD;  Location: [facility];  Service: Orthopedics;  Laterality: Right;  Right leg superficial peroneal nerve release    . Hardware removal  07/18/2011    Procedure: HARDWARE REMOVAL;  Surgeon: Vickki Hearing, MD;  Location: [facility];  Service: Orthopedics;  Laterality: Right;  . Colonoscopy    . Cataract extraction w/phaco Right 07/22/2012    Procedure: CATARACT EXTRACTION PHACO AND INTRAOCULAR LENS PLACEMENT (IOC);  Surgeon: Gemma Payor, MD;  Location: [facility];  Service: Ophthalmology;  Laterality: Right;  CDE:  18.93  . Cataract  extraction w/phaco Left 08/09/2012    Procedure: CATARACT EXTRACTION PHACO AND INTRAOCULAR LENS PLACEMENT (IOC);  Surgeon: Gemma Payor, MD;  Location: [facility];  Service: Ophthalmology;  Laterality: Left;  CDE: 17.21  . Cardiac catheterization     Family History  Problem Relation Age of Onset  . Alzheimer's disease    . Cancer    . Anesthesia problems Neg Hx   . Hypotension Neg Hx   . Malignant hyperthermia Neg Hx   . Pseudochol deficiency Neg Hx   . Hypertension Mother   . Hypertension Father    Social History  Substance Use Topics  . Smoking status: Never Smoker   . Smokeless tobacco: Never Used  . Alcohol Use: No   OB History    Gravida Para Term Preterm AB TAB SAB Ectopic Multiple Living            3     Review of Systems  Constitutional: Negative for fever.  HENT: Positive for congestion and ear pain. Negative for sore throat and tinnitus.   Gastrointestinal: Negative for vomiting and diarrhea.  Skin: Negative for rash.  Neurological: Positive for headaches.  All other systems reviewed and are negative.     Allergies  Codeine  Home Medications   Prior to Admission medications   Medication Sig Start Date End Date Taking? Authorizing Provider  amoxicillin (AMOXIL) 500 MG capsule Take 1,500 mg by mouth 2 (two) times daily.  Started 02/23/2015 02/23/15  Yes Historical Provider, MD  enalapril (VASOTEC) 20 MG tablet Take 1 tablet (20 mg total) by mouth 2 (two) times daily. 06/19/14  Yes Laqueta LindenSuresh A Koneswaran, MD  metoprolol succinate (TOPROL-XL) 25 MG 24 hr tablet TAKE ONE TABLET BY MOUTH ONCE DAILY. 07/24/14  Yes Laqueta LindenSuresh A Koneswaran, MD  rivaroxaban (XARELTO) 20 MG TABS tablet Take 1 tablet (20 mg total) by mouth daily with supper. 03/01/14  Yes Laqueta LindenSuresh A Koneswaran, MD  verapamil (CALAN-SR) 240 MG CR tablet TAKE (1) TABLET BY MOUTH EACH MORNING. 02/22/15  Yes Merlyn AlbertWilliam S Luking, MD  zolpidem (AMBIEN) 10 MG tablet TAKE 1 TABLET BY MOUTH AT BEDTIME FOR SLEEP. 02/01/15  Yes Merlyn AlbertWilliam S  Luking, MD  clobetasol (TEMOVATE) 0.05 % external solution APPLY TO AFFECTED AREAS TWICE DAILY. 01/30/15   Merlyn AlbertWilliam S Luking, MD  hydrochlorothiazide (HYDRODIURIL) 25 MG tablet TAKE (1) TABLET BY MOUTH EACH MORNING. Patient taking differently: Take 25 mg by mouth daily as needed (when blood pressure is high). TAKE (1) TABLET BY MOUTH EACH MORNING. 05/02/14   Merlyn AlbertWilliam S Luking, MD  LORazepam (ATIVAN) 0.5 MG tablet TAKE (1) TABLET DAILY AS NEEDED. Patient taking differently: TAKE (1) TABLET DAILY AS NEEDED ANXIETY. 09/14/14   Merlyn AlbertWilliam S Luking, MD  meclizine (ANTIVERT) 50 MG tablet Take 1 tablet (50 mg total) by mouth as needed for dizziness. 10/16/14   Laqueta LindenSuresh A Koneswaran, MD  metoprolol tartrate (LOPRESSOR) 25 MG tablet Take 25 mg daily as needed for palpitations Patient not taking: Reported on 02/25/2015 11/24/14   Laqueta LindenSuresh A Koneswaran, MD  ondansetron (ZOFRAN-ODT) 4 MG disintegrating tablet Take 1 tablet (4 mg total) by mouth every 6 (six) hours as needed for nausea or vomiting. 11/01/13   Merlyn AlbertWilliam S Luking, MD  PROCTOZONE-HC 2.5 % rectal cream APPLY RECTALLY 3 TIMES DAILY. 07/13/14   Merlyn AlbertWilliam S Luking, MD   BP 151/88 mmHg  Pulse 61  Temp(Src) 97.6 F (36.4 C) (Oral)  Resp 18  Ht 5\' 2"  (1.575 m)  Wt 60.782 kg  BMI 24.50 kg/m2  SpO2 95% Physical Exam  Constitutional: She is oriented to person, place, and time. She appears well-developed and well-nourished.  Non-toxic appearance.  HENT:  Head: Normocephalic.  Right Ear: Tympanic membrane and external ear normal.  Left Ear: Tympanic membrane and external ear normal.  Nasal congestion present. No swelling or increased warmth over the sinus areas.  There is no abnormality of the right or left mastoid area. The external ear shows no acute abnormalities. There is increase redness of the EAC. There is mild fluid behind the right tympanic membrane. Landmarks are identifiable. No bulging of the tympanic membrane on the right. No bulging of the tympanic  membrane on the left.  Eyes: EOM and lids are normal. Pupils are equal, round, and reactive to light.  Neck: Normal range of motion. Neck supple. Carotid bruit is not present.  No carotid bruits with evaluation by stethoscope.  Cardiovascular: Normal rate, regular rhythm, normal heart sounds, intact distal pulses and normal pulses.   Pulmonary/Chest: Breath sounds normal. No respiratory distress.  Abdominal: Soft. Bowel sounds are normal. There is no tenderness. There is no guarding.  Musculoskeletal: Normal range of motion.  Lymphadenopathy:       Head (right side): No submandibular adenopathy present.       Head (left side): No submandibular adenopathy present.    She has no cervical adenopathy.  Neurological: She is alert and oriented to person, place, and time. She has normal  strength. No cranial nerve deficit or sensory deficit.  Skin: Skin is warm and dry. No rash noted.  Psychiatric: She has a normal mood and affect. Her speech is normal.  Nursing note and vitals reviewed.   ED Course Patient seen with me by Dr. Cyndie Chime   Procedures (including critical care time) Labs Review Labs Reviewed - No data to display  Imaging Review No results found. I have personally reviewed and evaluated these images and lab results as part of my medical decision-making.   EKG Interpretation None      MDM Vital signs reviewed. Pulse oximetry is 95% on room air.  No rash about the face or ear Doubt herpes zoster. Patient has a history of trigeminal neuralgia, but no pain with touching of the face, or speaking, or with chewing. Pain seems to be more in the ear area, then the cheek, or lips or 4 head. A recent injury or trauma. The patient has been treated for an upper respiratory infection, and even an ear infection on January 6.    Final diagnoses:  Otalgia of right ear    **I have reviewed nursing notes, vital signs, and all appropriate lab and imaging results for this  patient.Ivery Quale, PA-C 03/01/15 1136  Leta Baptist, MD 03/04/15 [phone]

## 2015-02-26 ENCOUNTER — Encounter (HOSPITAL_COMMUNITY): Payer: Self-pay | Admitting: Emergency Medicine

## 2015-02-26 ENCOUNTER — Emergency Department (HOSPITAL_COMMUNITY)
Admission: EM | Admit: 2015-02-26 | Discharge: 2015-02-26 | Disposition: A | Discharge status: Home / Self Care | Payer: Medicare Other | Attending: Emergency Medicine | Admitting: Emergency Medicine

## 2015-02-26 ENCOUNTER — Emergency Department (HOSPITAL_COMMUNITY): Payer: Medicare Other

## 2015-02-26 DIAGNOSIS — Z792 Long term (current) use of antibiotics: Secondary | ICD-10-CM | POA: Diagnosis not present

## 2015-02-26 DIAGNOSIS — G4700 Insomnia, unspecified: Secondary | ICD-10-CM | POA: Insufficient documentation

## 2015-02-26 DIAGNOSIS — H9201 Otalgia, right ear: Secondary | ICD-10-CM

## 2015-02-26 DIAGNOSIS — R112 Nausea with vomiting, unspecified: Secondary | ICD-10-CM | POA: Diagnosis present

## 2015-02-26 DIAGNOSIS — Z7901 Long term (current) use of anticoagulants: Secondary | ICD-10-CM | POA: Diagnosis not present

## 2015-02-26 DIAGNOSIS — Z8719 Personal history of other diseases of the digestive system: Secondary | ICD-10-CM | POA: Insufficient documentation

## 2015-02-26 DIAGNOSIS — I10 Essential (primary) hypertension: Secondary | ICD-10-CM | POA: Insufficient documentation

## 2015-02-26 DIAGNOSIS — F329 Major depressive disorder, single episode, unspecified: Secondary | ICD-10-CM | POA: Diagnosis not present

## 2015-02-26 DIAGNOSIS — H6091 Unspecified otitis externa, right ear: Secondary | ICD-10-CM | POA: Insufficient documentation

## 2015-02-26 DIAGNOSIS — R531 Weakness: Secondary | ICD-10-CM | POA: Diagnosis not present

## 2015-02-26 DIAGNOSIS — I4892 Unspecified atrial flutter: Secondary | ICD-10-CM | POA: Diagnosis not present

## 2015-02-26 DIAGNOSIS — Z7952 Long term (current) use of systemic steroids: Secondary | ICD-10-CM | POA: Diagnosis not present

## 2015-02-26 DIAGNOSIS — F419 Anxiety disorder, unspecified: Secondary | ICD-10-CM | POA: Diagnosis not present

## 2015-02-26 DIAGNOSIS — Z8739 Personal history of other diseases of the musculoskeletal system and connective tissue: Secondary | ICD-10-CM | POA: Insufficient documentation

## 2015-02-26 DIAGNOSIS — G8929 Other chronic pain: Secondary | ICD-10-CM | POA: Insufficient documentation

## 2015-02-26 LAB — CBC WITH DIFFERENTIAL/PLATELET
Basophils Absolute: 0 10*3/uL (ref 0.0–0.1)
Basophils Relative: 0 %
Eosinophils Absolute: 0 10*3/uL (ref 0.0–0.7)
Eosinophils Relative: 0 %
HCT: 42.6 % (ref 36.0–46.0)
Hemoglobin: 14.3 g/dL (ref 12.0–15.0)
Lymphocytes Relative: 19 %
Lymphs Abs: 1.3 10*3/uL (ref 0.7–4.0)
MCH: 30 pg (ref 26.0–34.0)
MCHC: 33.6 g/dL (ref 30.0–36.0)
MCV: 89.3 fL (ref 78.0–100.0)
Monocytes Absolute: 0.3 10*3/uL (ref 0.1–1.0)
Monocytes Relative: 4 %
Neutro Abs: 5 10*3/uL (ref 1.7–7.7)
Neutrophils Relative %: 77 %
Platelets: 295 10*3/uL (ref 150–400)
RBC: 4.77 MIL/uL (ref 3.87–5.11)
RDW: 13.2 % (ref 11.5–15.5)
WBC: 6.6 10*3/uL (ref 4.0–10.5)

## 2015-02-26 LAB — BASIC METABOLIC PANEL
Anion gap: 11 (ref 5–15)
BUN: 13 mg/dL (ref 6–20)
CO2: 26 mmol/L (ref 22–32)
Calcium: 9.3 mg/dL (ref 8.9–10.3)
Chloride: 100 mmol/L — ABNORMAL LOW (ref 101–111)
Creatinine, Ser: 0.79 mg/dL (ref 0.44–1.00)
GFR calc Af Amer: >60 mL/min (ref >60)
GFR calc non Af Amer: >60 mL/min (ref >60)
Glucose, Bld: 133 mg/dL — ABNORMAL HIGH (ref 65–99)
Potassium: 3.7 mmol/L (ref 3.5–5.1)
Sodium: 137 mmol/L (ref 135–145)

## 2015-02-26 MED ORDER — LORAZEPAM 2 MG/ML IJ SOLN
INTRAMUSCULAR | Status: AC
  Filled 2015-02-26: qty 1

## 2015-02-26 MED ORDER — LORAZEPAM 2 MG/ML IJ SOLN
0.5000 mg | Freq: Once | INTRAMUSCULAR | Status: AC
  Administered 2015-02-26: 0.5 mg via INTRAVENOUS

## 2015-02-26 MED ORDER — SODIUM CHLORIDE 0.9 % IV BOLUS (SEPSIS)
1000.0000 mL | Freq: Once | INTRAVENOUS | Status: AC
  Administered 2015-02-26: 1000 mL via INTRAVENOUS

## 2015-02-26 MED ORDER — METOCLOPRAMIDE HCL 5 MG/ML IJ SOLN
10.0000 mg | Freq: Once | INTRAMUSCULAR | Status: AC
  Administered 2015-02-26: 10 mg via INTRAVENOUS
  Filled 2015-02-26: qty 2

## 2015-02-26 MED ORDER — METOCLOPRAMIDE HCL 5 MG PO TABS: 5.0000 mg | ORAL_TABLET | Freq: Four times a day (QID) | ORAL | Status: DC

## 2015-02-26 NOTE — ED Provider Notes (Signed)
CSN: [id number]     Arrival date & time 02/26/15  0715 History   First MD Initiated Contact with Patient 02/26/15 0725     Chief Complaint  Patient presents with  . Emesis     (Consider location/radiation/quality/duration/timing/severity/associated sxs/prior Treatment) HPI Comments: 82 year old female with history of high blood pressure, atrial flutter presents with recurrent nausea and vomiting since this morning. Patient was seen recently for worsening ear pain is currently on Ciprodex for external ear infection as well as amoxicillin. Patient was given narcotic pain meds his pain is very severe for the patient. His first dose of pain meds help her pain however after the second dose she start having recurrent nausea and vomiting. Patient still has right ear pain mild improvement since yesterday. Patient has an ENT doctor but has not seen him specifically for this episode in the past week.  No fevers or chills. No headache no neurologic symptoms. No diabetes history.  Patient is a 82 y.o. female presenting with vomiting. The history is provided by a relative and the patient.  Emesis Associated symptoms: no abdominal pain, no chills and no headaches     Past Medical History  Diagnosis Date  . HTN (hypertension)   . Osteoporosis   . Constipation, chronic   . PONV (postoperative nausea and vomiting)   . Arthritis   . Migraine headache   . Chronic back pain   . Depression   . Anxiety   . Insomnia   . A-fib [facility])    Past Surgical History  Procedure Laterality Date  . Partial hysterectomy    . Ankle fracture surgery      2010-right  . Nerve repair  07/18/2011    Procedure: NERVE REPAIR;  Surgeon: Vickki HearingStanley E Harrison, MD;  Location: [facility];  Service: Orthopedics;  Laterality: Right;  Right leg superficial peroneal nerve release    . Hardware removal  07/18/2011    Procedure: HARDWARE REMOVAL;  Surgeon: Vickki HearingStanley E Harrison, MD;  Location: [facility];  Service: Orthopedics;  Laterality: Right;   . Colonoscopy    . Cataract extraction w/phaco Right 07/22/2012    Procedure: CATARACT EXTRACTION PHACO AND INTRAOCULAR LENS PLACEMENT (IOC);  Surgeon: Gemma PayorKerry Hunt, MD;  Location: [facility];  Service: Ophthalmology;  Laterality: Right;  CDE:  18.93  . Cataract extraction w/phaco Left 08/09/2012    Procedure: CATARACT EXTRACTION PHACO AND INTRAOCULAR LENS PLACEMENT (IOC);  Surgeon: Gemma PayorKerry Hunt, MD;  Location: [facility];  Service: Ophthalmology;  Laterality: Left;  CDE: 17.21  . Cardiac catheterization     Family History  Problem Relation Age of Onset  . Alzheimer's disease    . Cancer    . Anesthesia problems Neg Hx   . Hypotension Neg Hx   . Malignant hyperthermia Neg Hx   . Pseudochol deficiency Neg Hx   . Hypertension Mother   . Hypertension Father    Social History  Substance Use Topics  . Smoking status: Never Smoker   . Smokeless tobacco: Never Used  . Alcohol Use: No   OB History    Gravida Para Term Preterm AB TAB SAB Ectopic Multiple Living            3     Review of Systems  Constitutional: Positive for appetite change. Negative for fever and chills.  HENT: Positive for ear pain. Negative for congestion.   Eyes: Negative for visual disturbance.  Respiratory: Negative for shortness of breath.   Cardiovascular: Negative for chest pain.  Gastrointestinal: Positive  for nausea and vomiting. Negative for abdominal pain.  Genitourinary: Negative for dysuria and flank pain.  Musculoskeletal: Negative for back pain, neck pain and neck stiffness.  Skin: Negative for rash.  Neurological: Positive for weakness ( general). Negative for facial asymmetry, light-headedness and headaches.      Allergies  Codeine  Home Medications   Prior to Admission medications   Medication Sig Start Date End Date Taking? Authorizing Provider  amoxicillin (AMOXIL) 500 MG capsule Take 1,500 mg by mouth 2 (two) times daily. Started 02/23/2015 02/23/15   Historical Provider, MD   ciprofloxacin-dexamethasone (CIPRODEX) otic suspension Place 4 drops into the right ear 2 (two) times daily. 02/25/15   Ivery Quale, PA-C  clobetasol (TEMOVATE) 0.05 % external solution APPLY TO AFFECTED AREAS TWICE DAILY. 01/30/15   Merlyn Albert, MD  enalapril (VASOTEC) 20 MG tablet Take 1 tablet (20 mg total) by mouth 2 (two) times daily. 06/19/14   Laqueta Linden, MD  hydrochlorothiazide (HYDRODIURIL) 25 MG tablet TAKE (1) TABLET BY MOUTH EACH MORNING. Patient taking differently: Take 25 mg by mouth daily as needed (when blood pressure is high). TAKE (1) TABLET BY MOUTH EACH MORNING. 05/02/14   Merlyn Albert, MD  LORazepam (ATIVAN) 0.5 MG tablet TAKE (1) TABLET DAILY AS NEEDED. Patient taking differently: TAKE (1) TABLET DAILY AS NEEDED ANXIETY. 09/14/14   Merlyn Albert, MD  meclizine (ANTIVERT) 50 MG tablet Take 1 tablet (50 mg total) by mouth as needed for dizziness. 10/16/14   Laqueta Linden, MD  metoprolol succinate (TOPROL-XL) 25 MG 24 hr tablet TAKE ONE TABLET BY MOUTH ONCE DAILY. 07/24/14   Laqueta Linden, MD  metoprolol tartrate (LOPRESSOR) 25 MG tablet Take 25 mg daily as needed for palpitations Patient not taking: Reported on 02/25/2015 11/24/14   Laqueta Linden, MD  ondansetron (ZOFRAN) 4 MG tablet Take 1 tablet (4 mg total) by mouth every 6 (six) hours. 02/25/15   Ivery Quale, PA-C  ondansetron (ZOFRAN-ODT) 4 MG disintegrating tablet Take 1 tablet (4 mg total) by mouth every 6 (six) hours as needed for nausea or vomiting. 11/01/13   Merlyn Albert, MD  oxyCODONE-acetaminophen (PERCOCET/ROXICET) 5-325 MG tablet Take 1 tablet by mouth every 6 (six) hours as needed. 02/25/15   Ivery Quale, PA-C  PROCTOZONE-HC 2.5 % rectal cream APPLY RECTALLY 3 TIMES DAILY. 07/13/14   Merlyn Albert, MD  rivaroxaban (XARELTO) 20 MG TABS tablet Take 1 tablet (20 mg total) by mouth daily with supper. 03/01/14   Laqueta Linden, MD  verapamil (CALAN-SR) 240 MG CR tablet TAKE (1)  TABLET BY MOUTH EACH MORNING. 02/22/15   Merlyn Albert, MD  zolpidem (AMBIEN) 10 MG tablet TAKE 1 TABLET BY MOUTH AT BEDTIME FOR SLEEP. 02/01/15   Merlyn Albert, MD   BP 172/92 mmHg  Pulse 90  Temp(Src) 98.2 F (36.8 C) (Oral)  Resp 14  Ht 5\' 3"  (1.6 m)  Wt 134 lb (60.782 kg)  BMI 23.74 kg/m2  SpO2 99% Physical Exam  Constitutional: She is oriented to person, place, and time. She appears well-developed and well-nourished.  HENT:  Head: Normocephalic and atraumatic.  Dry mucous membranes, patient has inflamed right external ear canal no active drainage, mild tender pulling on the ear. No focal mastoid tenderness  Eyes: Conjunctivae are normal. Right eye exhibits no discharge. Left eye exhibits no discharge.  Neck: Normal range of motion. Neck supple. No tracheal deviation present.  Cardiovascular: Normal rate.   Pulmonary/Chest: Effort normal.  Abdominal: Soft. She exhibits no distension. There is no tenderness.  Musculoskeletal: She exhibits no edema.  Neurological: She is alert and oriented to person, place, and time. She has normal strength. No cranial nerve deficit (no nystagmus). GCS eye subscore is 4. GCS verbal subscore is 5. GCS motor subscore is 6.  Subtle general weakness no focality  Skin: Skin is warm. No rash noted.  Psychiatric:  Gen. weakness appearing  Nursing note and vitals reviewed.   ED Course  Procedures (including critical care time) Labs Review Labs Reviewed  BASIC METABOLIC PANEL - Abnormal; Notable for the following:    Chloride 100 (*)    Glucose, Bld 133 (*)    All other components within normal limits  CBC WITH DIFFERENTIAL/PLATELET    Imaging Review Ct Head Wo Contrast  02/26/2015  CLINICAL DATA:  Ear pain yesterday, external ear infection, began vomiting last night, question nausea from Percocet, no relief with Zofran, history hypertension EXAM: CT HEAD WITHOUT CONTRAST TECHNIQUE: Contiguous axial images were obtained from the base of the  skull through the vertex without intravenous contrast. COMPARISON:  None FINDINGS: Minimal age-related atrophy. Normal ventricular morphology. No midline shift or mass effect. Small vessel chronic ischemic changes of deep cerebral white matter. No intracranial hemorrhage, mass lesion, or acute infarction. No extra-axial fluid collections. Visualized paranasal sinuses and mastoid air cells clear. Bones unremarkable. IMPRESSION: Small vessel chronic ischemic changes of deep cerebral white matter. No acute intracranial abnormalities. Electronically Signed   By: Ulyses Southward M.D.   On: 02/26/2015 09:02   I have personally reviewed and evaluated these images and lab results as part of my medical decision-making.   EKG Interpretation None      MDM   Final diagnoses:  Non-intractable vomiting with nausea, vomiting of unspecified type  External otitis, right  Right ear pain   Patient presents with recurrent nausea vomiting since taking second dose of pain medicines. Clinically patient has significant external otitis, no immunosuppression history to make her high risk for bone infection. Plan for screening CT head, IV fluids and antiemetics. If we can control her symptoms patient has follow-up with ENT specialist. If unable to control her nausea and vomiting plan for observation the hospital this patient will need to take multiple different home medications and lives alone.  Patient improved significantly on reexamination, tolerating oral. Patient prefers going home we discussed observation the hospital versus close outpatient follow-up with primary doctor and ENT. Discussed stopping narcotics. Discussed continuing antibiotic eardrops.  Results and differential diagnosis were discussed with the patient/parent/guardian. Xrays were independently reviewed by myself.  Close follow up outpatient was discussed, comfortable with the plan.   Medications  LORazepam (ATIVAN) 2 MG/ML injection (not administered)   metoCLOPramide (REGLAN) injection 10 mg (10 mg Intravenous Given 02/26/15 0744)  sodium chloride 0.9 % bolus 1,000 mL (0 mLs Intravenous Stopped 02/26/15 0946)  LORazepam (ATIVAN) injection 0.5 mg (0.5 mg Intravenous Given 02/26/15 0816)    Filed Vitals:   02/26/15 0719 02/26/15 0946  BP: 172/92 136/69  Pulse: 90 88  Temp: 98.2 F (36.8 C) 97.7 F (36.5 C)  TempSrc: Oral Oral  Resp: 14 16  Height: 5\' 3"  (1.6 m)   Weight: 134 lb (60.782 kg)   SpO2: 99% 96%    Final diagnoses:  Non-intractable vomiting with nausea, vomiting of unspecified type  External otitis, right  Right ear pain          Blane Ohara, MD 02/26/15 [phone]

## 2015-02-26 NOTE — Discharge Instructions (Signed)
Take your Zofran for nausea if it does not help he can try Reglan however it may make you sleepy. Stay well-hydrated as tolerated. Continue your antibiotic ear drop. Take Tylenol 1000 mg 4 times a day as needed for pain. Stop taking your narcotic medicine.  If you were given medicines take as directed.  If you are on coumadin or contraceptives realize their levels and effectiveness is altered by many different medicines.  If you have any reaction (rash, tongues swelling, other) to the medicines stop taking and see a physician.    If your blood pressure was elevated in the ER make sure you follow up for management with a primary doctor or return for chest pain, shortness of breath or stroke symptoms.  Please follow up as directed and return to the ER or see a physician for new or worsening symptoms.  Thank you. Filed Vitals:   02/26/15 0719 02/26/15 0946  BP: 172/92 136/69  Pulse: 90 88  Temp: 98.2 F (36.8 C) 97.7 F (36.5 C)  TempSrc: Oral Oral  Resp: 14 16  Height: 5\' 3"  (1.6 m)   Weight: 134 lb (60.782 kg)   SpO2: 99% 96%

## 2015-02-26 NOTE — ED Notes (Signed)
Pt states she was here yesterday for ear pain. Pt states she began vomiting last night ~2100. Family states she became nauseated after taking percocet last night. Pt did not attempt to take zofran until 0530 this morning, stating it did not provide any relief. Pt continuously heaving.

## 2015-02-26 NOTE — ED Notes (Signed)
Pt. Dry heaving while walking back to room from restroom.

## 2015-02-26 NOTE — ED Notes (Signed)
Pt seen here ear pain yesterday, given morphine for pain and has emesis since taking.  Pt has taken zofran at home.  Pt alert and oriented.

## 2015-03-13 ENCOUNTER — Other Ambulatory Visit: Payer: Self-pay | Admitting: Cardiovascular Disease

## 2015-03-13 NOTE — Telephone Encounter (Signed)
Just got PA fax from [facility] TODAY in process will give pt samples, only got pt voicemail,lm for pt to come and pick up

## 2015-03-13 NOTE — Telephone Encounter (Signed)
Pt is out of her Xarelto, pt said she has enough for 2 days, and that the pharmacy said they sent [place] a request but they haven't heard anything back

## 2015-03-14 ENCOUNTER — Other Ambulatory Visit: Payer: Self-pay

## 2015-03-14 NOTE — Telephone Encounter (Signed)
Xarelto has been approved from [facility]. Phone [phone], Fax [phone]  Patient ID# [id number]   UXL/KGM:[id number]  Approved through 02/17/16 under Medicare Part D benefit. Reviewed by: [id number]  Reference # [id number]

## 2015-03-20 ENCOUNTER — Encounter: Payer: Self-pay | Admitting: Cardiovascular Disease

## 2015-03-20 ENCOUNTER — Ambulatory Visit (INDEPENDENT_AMBULATORY_CARE_PROVIDER_SITE_OTHER): Payer: Medicare Other | Admitting: Cardiovascular Disease

## 2015-03-20 VITALS — BP 120/68 | HR 67 | Ht 62.0 in | Wt 137.0 lb

## 2015-03-20 DIAGNOSIS — Z87898 Personal history of other specified conditions: Secondary | ICD-10-CM | POA: Diagnosis not present

## 2015-03-20 DIAGNOSIS — I480 Paroxysmal atrial fibrillation: Secondary | ICD-10-CM | POA: Diagnosis not present

## 2015-03-20 DIAGNOSIS — I10 Essential (primary) hypertension: Secondary | ICD-10-CM

## 2015-03-20 DIAGNOSIS — Z9289 Personal history of other medical treatment: Secondary | ICD-10-CM

## 2015-03-20 NOTE — Progress Notes (Signed)
Patient ID: Katherine Lane, female   DOB: [birth year], 82 y.o.   MRN: [id number]      SUBJECTIVE: Katherine Lane returns for routine follow up. She has a history of paroxysmal atrial fibrillation, essential hypertension, and benign positional vertigo.  Evaluated on 02/26/15 in ED for emesis after receiving pain meds for neuralgia. BP initially high then normalized. CBC and BMET unremarkable.  Currently doing well and denies chest pain, palpitations, leg swelling, and shortness of breath.   Review of Systems: As per "subjective", otherwise negative.  Allergies  Allergen Reactions  . Codeine Nausea And Vomiting    Patient states intolerance to all pain medications    Current Outpatient Prescriptions  Medication Sig Dispense Refill  . enalapril (VASOTEC) 20 MG tablet Take 1 tablet (20 mg total) by mouth 2 (two) times daily. 180 tablet 3  . hydrochlorothiazide (HYDRODIURIL) 25 MG tablet TAKE (1) TABLET BY MOUTH EACH MORNING. (Patient taking differently: Take 25 mg by mouth daily as needed (when blood pressure is high). TAKE (1) TABLET BY MOUTH EACH MORNING.) 90 tablet 1  . LORazepam (ATIVAN) 0.5 MG tablet TAKE (1) TABLET DAILY AS NEEDED. (Patient taking differently: TAKE (1) TABLET DAILY AS NEEDED ANXIETY.) 30 tablet 5  . metoCLOPramide (REGLAN) 5 MG tablet Take 1 tablet (5 mg total) by mouth 4 (four) times daily. 6 tablet 0  . metoprolol succinate (TOPROL-XL) 25 MG 24 hr tablet TAKE ONE TABLET BY MOUTH ONCE DAILY. 90 tablet 3  . metoprolol tartrate (LOPRESSOR) 25 MG tablet Take 25 mg daily as needed for palpitations 30 tablet 6  . PROCTOZONE-HC 2.5 % rectal cream APPLY RECTALLY 3 TIMES DAILY. 30 g 5  . verapamil (CALAN-SR) 240 MG CR tablet TAKE (1) TABLET BY MOUTH EACH MORNING. 30 tablet 0  . XARELTO 20 MG TABS tablet TAKE 1 TABLET DAILY WITH SUPPER. 30 tablet 3  . zolpidem (AMBIEN) 10 MG tablet TAKE 1 TABLET BY MOUTH AT BEDTIME FOR SLEEP. 30 tablet 5   No current facility-administered  medications for this visit.    Past Medical History  Diagnosis Date  . HTN (hypertension)   . Osteoporosis   . Constipation, chronic   . PONV (postoperative nausea and vomiting)   . Arthritis   . Migraine headache   . Chronic back pain   . Depression   . Anxiety   . Insomnia   . A-fib [facility])     Past Surgical History  Procedure Laterality Date  . Partial hysterectomy    . Ankle fracture surgery      2010-right  . Nerve repair  07/18/2011    Procedure: NERVE REPAIR;  Surgeon: Vickki Hearing, MD;  Location: [facility];  Service: Orthopedics;  Laterality: Right;  Right leg superficial peroneal nerve release    . Hardware removal  07/18/2011    Procedure: HARDWARE REMOVAL;  Surgeon: Vickki Hearing, MD;  Location: [facility];  Service: Orthopedics;  Laterality: Right;  . Colonoscopy    . Cataract extraction w/phaco Right 07/22/2012    Procedure: CATARACT EXTRACTION PHACO AND INTRAOCULAR LENS PLACEMENT (IOC);  Surgeon: Gemma Payor, MD;  Location: [facility];  Service: Ophthalmology;  Laterality: Right;  CDE:  18.93  . Cataract extraction w/phaco Left 08/09/2012    Procedure: CATARACT EXTRACTION PHACO AND INTRAOCULAR LENS PLACEMENT (IOC);  Surgeon: Gemma Payor, MD;  Location: [facility];  Service: Ophthalmology;  Laterality: Left;  CDE: 17.21  . Cardiac catheterization      Social History   Social History  .  Marital Status: Widowed    Spouse Name: N/A  . Number of Children: N/A  . Years of Education: N/A   Occupational History  . retired    Social History Main Topics  . Smoking status: Never Smoker   . Smokeless tobacco: Never Used  . Alcohol Use: No  . Drug Use: No  . Sexual Activity: Yes    Birth Control/ Protection: Surgical   Other Topics Concern  . Not on file   Social History Narrative     Filed Vitals:   03/20/15 1112  BP: 120/68  Pulse: 67  Height:  (1.575 m)  Weight: 137 lb (62.143 kg)  SpO2: 96%    PHYSICAL EXAM General: NAD HEENT: Normal. Neck: No  JVD, no thyromegaly. Lungs: Clear to auscultation bilaterally with normal respiratory effort. CV: Nondisplaced PMI. Regular rate and rhythm with occasional premature contractions, normal S1/S2, no S3/S4, no murmur. No pretibial or periankle edema. No carotid bruits.  Abdomen: Soft, nontender, no distention.  Neurologic: Alert and oriented x 3.  Psych: Normal affect. Skin: Normal. Musculoskeletal: No gross deformities. Extremities: No clubbing or cyanosis.   ECG: Most recent ECG reviewed.      ASSESSMENT AND PLAN: 1. Paroxysmal atrial fibrillation: Symptomatically stable on verapamil and metoprolol. Continue Xarelto.   2. Essential HTN: Controlled. No changes to therapy indicated.  Dispo: f/u 6 months.   Prentice Docker, M.D., F.A.C.C.

## 2015-03-20 NOTE — Patient Instructions (Signed)
Medication Instructions:  Your physician recommends that you continue on your current medications as directed. Please refer to the Current Medication list given to you today.  Labwork: NONE  Testing/Procedures: NONE  Follow-Up: Your physician wants you to follow-up in: 6 MONTHS WITH DR. KONESWARAN. You will receive a reminder letter in the mail two months in advance. If you don't receive a letter, please call our office to schedule the follow-up appointment.  Any Other Special Instructions Will Be Listed Below (If Applicable).  If you need a refill on your cardiac medications before your next appointment, please call your pharmacy. 

## 2015-03-22 ENCOUNTER — Ambulatory Visit (INDEPENDENT_AMBULATORY_CARE_PROVIDER_SITE_OTHER): Payer: Medicare Other | Admitting: Otolaryngology

## 2015-03-22 DIAGNOSIS — H7201 Central perforation of tympanic membrane, right ear: Secondary | ICD-10-CM | POA: Diagnosis not present

## 2015-03-22 DIAGNOSIS — H9209 Otalgia, unspecified ear: Secondary | ICD-10-CM

## 2015-03-24 ENCOUNTER — Other Ambulatory Visit: Payer: Self-pay | Admitting: Family Medicine

## 2015-03-24 ENCOUNTER — Other Ambulatory Visit: Payer: Self-pay | Admitting: Cardiovascular Disease

## 2015-04-09 ENCOUNTER — Ambulatory Visit (INDEPENDENT_AMBULATORY_CARE_PROVIDER_SITE_OTHER): Payer: Medicare Other | Admitting: Family Medicine

## 2015-04-09 ENCOUNTER — Encounter: Payer: Self-pay | Admitting: Family Medicine

## 2015-04-09 VITALS — BP 120/80 | Ht 61.5 in | Wt 138.0 lb

## 2015-04-09 DIAGNOSIS — M810 Age-related osteoporosis without current pathological fracture: Secondary | ICD-10-CM | POA: Diagnosis not present

## 2015-04-09 DIAGNOSIS — I10 Essential (primary) hypertension: Secondary | ICD-10-CM | POA: Diagnosis not present

## 2015-04-09 DIAGNOSIS — E782 Mixed hyperlipidemia: Secondary | ICD-10-CM | POA: Diagnosis not present

## 2015-04-09 MED ORDER — LORAZEPAM 0.5 MG PO TABS: ORAL_TABLET | ORAL | Status: DC

## 2015-04-09 MED ORDER — VERAPAMIL HCL ER 240 MG PO TBCR: EXTENDED_RELEASE_TABLET | ORAL | Status: DC

## 2015-04-09 MED ORDER — ZOLPIDEM TARTRATE 10 MG PO TABS: ORAL_TABLET | ORAL | Status: DC

## 2015-04-09 NOTE — Progress Notes (Signed)
   Subjective:    Patient ID: Katherine Lane, female    DOB: 1932-02-27, 82 y.o.   MRN: [id number]  Hypertension This is a chronic problem. The current episode started more than 1 year ago. The problem has been gradually improving since onset. There are no associated agents to hypertension. There are no known risk factors for coronary artery disease. Treatments tried: verapamil. The current treatment provides moderate improvement. There are no compliance problems.    Patient has no other concerns at this time.   Having ear discomfort off and on with ext otitis, overall doing better now.  Uses ambien regularly states definitely needs help sleep.  Ongoing anxiety uses when necessary benzodiazepines   BP meds faithful does not miss a dose Review of Systems No headache no chest pain no back pain abdominal pain no change in bowel habits no blood in stool ROS otherwise negative    Objective:   Physical Exam Alert vitals stable blood pressure excellent on repeat. HEENT normal neck supple lungs clear heart rhythm ankles without edema       Assessment & Plan:  Impression hypertension good control of patient maintain same meds #2 insomnia ongoing need for meds #3 anxiety intermittent with need for when necessary medicines where drowsy precautions plan patient adamantly declines all offers a preventative interventions including mammograms physicals immunizations blood work etc patient has known osteoporosis and declines any type of intervention or further testing for this. 25 minutes spent most in discussion

## 2015-06-18 ENCOUNTER — Other Ambulatory Visit: Payer: Self-pay | Admitting: Cardiovascular Disease

## 2015-07-23 ENCOUNTER — Other Ambulatory Visit: Payer: Self-pay | Admitting: Cardiovascular Disease

## 2015-08-14 ENCOUNTER — Other Ambulatory Visit: Payer: Self-pay | Admitting: Family Medicine

## 2015-09-18 ENCOUNTER — Encounter: Payer: Self-pay | Admitting: Cardiovascular Disease

## 2015-09-18 ENCOUNTER — Ambulatory Visit (INDEPENDENT_AMBULATORY_CARE_PROVIDER_SITE_OTHER): Payer: Medicare Other | Admitting: Cardiovascular Disease

## 2015-09-18 VITALS — BP 134/78 | HR 68 | Ht 63.0 in | Wt 135.0 lb

## 2015-09-18 DIAGNOSIS — I480 Paroxysmal atrial fibrillation: Secondary | ICD-10-CM | POA: Diagnosis not present

## 2015-09-18 DIAGNOSIS — I4891 Unspecified atrial fibrillation: Secondary | ICD-10-CM | POA: Diagnosis not present

## 2015-09-18 DIAGNOSIS — I10 Essential (primary) hypertension: Secondary | ICD-10-CM | POA: Diagnosis not present

## 2015-09-18 NOTE — Patient Instructions (Signed)
Your physician wants you to follow-up in: 1 year Dr Koneswaran You will receive a reminder letter in the mail two months in advance. If you don't receive a letter, please call our office to schedule the follow-up appointment.     Your physician recommends that you continue on your current medications as directed. Please refer to the Current Medication list given to you today.     Thank you for choosing [facility] !        

## 2015-09-18 NOTE — Progress Notes (Signed)
SUBJECTIVE: [name] returns for routine follow up. She has a history of paroxysmal atrial fibrillation, essential hypertension, and benign positional vertigo.  Currently doing well and denies chest pain, palpitations, leg swelling, and shortness of breath.  ECG performed in the office today which I personally reviewed demonstrates normal sinus rhythm with no ischemic ST segment or T-wave abnormalities, nor any arrhythmias.  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   Review of Systems: As per "subjective", otherwise negative.  Allergies  Allergen Reactions  . Codeine Nausea And Vomiting    Patient states intolerance to all pain medications    Current Outpatient Prescriptions  Medication Sig Dispense Refill  . enalapril (VASOTEC) 20 MG tablet TAKE (1) TABLET BY MOUTH TWICE DAILY. 180 tablet 3  . LORazepam (ATIVAN) 0.5 MG tablet TAKE (1) TABLET DAILY AS NEEDED. 30 tablet 2  . metoCLOPramide (REGLAN) 5 MG tablet Take 1 tablet (5 mg total) by mouth 4 (four) times daily. 6 tablet 0  . metoprolol succinate (TOPROL-XL) 25 MG 24 hr tablet TAKE ONE TABLET BY MOUTH ONCE DAILY. 90 tablet 3  . metoprolol tartrate (LOPRESSOR) 25 MG tablet Take 25 mg daily as needed for palpitations 30 tablet 6  . PROCTOSOL HC 2.5 % rectal cream APPLY RECTALLY 3 TIMES DAILY. 28.35 g 0  . verapamil (CALAN-SR) 240 MG CR tablet TAKE (1) TABLET BY MOUTH EACH MORNING. 30 tablet 11  . XARELTO 20 MG TABS tablet TAKE 1 TABLET DAILY WITH SUPPER. 30 tablet 6  . zolpidem (AMBIEN) 10 MG tablet TAKE 1 TABLET BY MOUTH AT BEDTIME FOR SLEEP. 30 tablet 5   No current facility-administered medications for this visit.     Past Medical History:  Diagnosis Date  . A-fib (HCC)   . Anxiety   . Arthritis   . Chronic back pain   . Constipation, chronic   . Depression   . HTN (hypertension)   . Insomnia   . Migraine headache   . Osteoporosis   . PONV  (postoperative nausea and vomiting)     Past Surgical History:  Procedure Laterality Date  . ANKLE FRACTURE SURGERY     2010-right  . CARDIAC CATHETERIZATION    . CATARACT EXTRACTION W/PHACO Right 07/22/2012   Procedure: CATARACT EXTRACTION PHACO AND INTRAOCULAR LENS PLACEMENT (IOC);  Surgeon: Gemma Payor, MD;  Location: [facility];  Service: Ophthalmology;  Laterality: Right;  CDE:  18.93  . CATARACT EXTRACTION W/PHACO Left 08/09/2012   Procedure: CATARACT EXTRACTION PHACO AND INTRAOCULAR LENS PLACEMENT (IOC);  Surgeon: Gemma Payor, MD;  Location: [facility];  Service: Ophthalmology;  Laterality: Left;  CDE: 17.21  . COLONOSCOPY    . HARDWARE REMOVAL  07/18/2011   Procedure: HARDWARE REMOVAL;  Surgeon: Vickki Hearing, MD;  Location: [facility];  Service: Orthopedics;  Laterality: Right;  . NERVE REPAIR  07/18/2011   Procedure: NERVE REPAIR;  Surgeon: Vickki Hearing, MD;  Location: [facility];  Service: Orthopedics;  Laterality: Right;  Right leg superficial peroneal nerve release    . PARTIAL HYSTERECTOMY      Social History   Social History  . Marital status: Widowed    Spouse name: N/A  . Number of children: N/A  . Years of education: N/A   Occupational History  . retired Retired   Social History Main Topics  . Smoking status: Never Smoker  . Smokeless tobacco: Never Used  . Alcohol use  No  . Drug use: No  . Sexual activity: Yes    Birth control/ protection: Surgical   Other Topics Concern  . Not on file   Social History Narrative  . No narrative on file     Vitals:   09/18/15 1136  BP: 134/78  Pulse: 68  SpO2: 98%  Weight: 135 lb (61.2 kg)  Height: 5\' 3"  (1.6 m)    PHYSICAL EXAM General: NAD HEENT: Normal. Neck: No JVD, no thyromegaly. Lungs: Clear to auscultation bilaterally with normal respiratory effort. CV: Nondisplaced PMI. Regular rate and rhythm with occasional premature contractions, normal S1/S2, no S3/S4, no murmur. No pretibial or periankle edema. No  carotid bruits.  Abdomen: Soft, nontender, no distention.  Neurologic: Alert and oriented x 3.  Psych: Normal affect. Skin: Normal. Musculoskeletal: No gross deformities. Extremities: No clubbing or cyanosis.     ECG: Most recent ECG reviewed.      ASSESSMENT AND PLAN: 1. Paroxysmal atrial fibrillation: Symptomatically stable on verapamil and metoprolol. Continue Xarelto.   2. Essential HTN: Controlled. No changes to therapy indicated.  Dispo: f/u 1 year.   Prentice Docker, M.D., F.A.C.C.

## 2015-09-21 ENCOUNTER — Encounter: Payer: Self-pay | Admitting: Nurse Practitioner

## 2015-09-21 ENCOUNTER — Ambulatory Visit (INDEPENDENT_AMBULATORY_CARE_PROVIDER_SITE_OTHER): Payer: Medicare Other | Admitting: Nurse Practitioner

## 2015-09-21 VITALS — BP 102/80 | Ht 61.5 in | Wt 134.4 lb

## 2015-09-21 DIAGNOSIS — M5441 Lumbago with sciatica, right side: Secondary | ICD-10-CM

## 2015-09-21 DIAGNOSIS — Z79899 Other long term (current) drug therapy: Secondary | ICD-10-CM

## 2015-09-21 DIAGNOSIS — K644 Residual hemorrhoidal skin tags: Secondary | ICD-10-CM

## 2015-09-21 DIAGNOSIS — R5383 Other fatigue: Secondary | ICD-10-CM | POA: Diagnosis not present

## 2015-09-21 DIAGNOSIS — K648 Other hemorrhoids: Secondary | ICD-10-CM | POA: Diagnosis not present

## 2015-09-21 DIAGNOSIS — Z13220 Encounter for screening for lipoid disorders: Secondary | ICD-10-CM

## 2015-09-21 DIAGNOSIS — Z780 Asymptomatic menopausal state: Secondary | ICD-10-CM

## 2015-09-21 MED ORDER — HYDROCORTISONE ACE-PRAMOXINE 1-1 % RE FOAM: 1.0000 | Freq: Two times a day (BID) | RECTAL | 0 refills | Status: DC

## 2015-09-23 ENCOUNTER — Encounter: Payer: Self-pay | Admitting: Nurse Practitioner

## 2015-09-23 NOTE — Progress Notes (Signed)
Subjective:  Presents for c/o low back pain. Pressure in the rectal area. Has chronic constipation. No blood in stools. Pain with defecation. Has seen Dr. Venetia MaxonStern in the past for back injections. Low back pain radiating into the right buttock into the right thigh. History of osteoporosis. Is no longer on medication.   Objective:   BP 102/80   Ht 5' 1.5" (1.562 m)   Wt 134 lb 6 oz (61 kg)   BMI 24.98 kg/m  NAD. Alert, oriented. Lungs clear. Heart RRR. Abdomen soft, non tender. Anal area: large pink external hemorrhoid noted. Rectal exam: no masses or stool noted. Pain to palpation right low back area into right buttock. SLR neg bilat. Reflexes normal. Gait slow but steady.  See MRI spine dated 04/26/13.   Assessment: Right-sided low back pain with right-sided sciatica  External hemorrhoid  Post-menopausal - Plan: DG Bone Density  Screening cholesterol level - Plan: Lipid panel  High risk medication use - Plan: Hepatic function panel, Basic metabolic panel  Other fatigue - Plan: TSH, CBC with Differential/Platelet  Plan:  Meds ordered this encounter  Medications  . hydrocortisone-pramoxine (PROCTOFOAM-HC) rectal foam    Sig: Place 1 applicator rectally 2 (two) times daily.    Dispense:  10 g    Refill:  0    Order Specific Question:   Supervising Provider    Answer:   Merlyn AlbertLUKING, WILLIAM S [[id number]]   Refer back to Dr. Venetia MaxonStern for recheck of back pain. Trial of Proctofoam HC; call back if no improvement in hemorrhoid.  Return if symptoms worsen or fail to improve.

## 2015-09-25 LAB — BASIC METABOLIC PANEL
BUN/Creatinine Ratio: 17 (ref 12–28)
BUN: 15 mg/dL (ref 8–27)
CO2: 26 mmol/L (ref 18–29)
Calcium: 9.4 mg/dL (ref 8.7–10.3)
Chloride: 103 mmol/L (ref 96–106)
Creatinine, Ser: 0.89 mg/dL (ref 0.57–1.00)
GFR calc Af Amer: 71 mL/min/{1.73_m2} (ref >59)
GFR calc non Af Amer: 61 mL/min/{1.73_m2} (ref >59)
Glucose: 86 mg/dL (ref 65–99)
Potassium: 4.4 mmol/L (ref 3.5–5.2)
Sodium: 143 mmol/L (ref 134–144)

## 2015-09-25 LAB — HEPATIC FUNCTION PANEL
ALT: 8 IU/L (ref 0–32)
AST: 14 IU/L (ref 0–40)
Albumin: 4.4 g/dL (ref 3.5–4.7)
Alkaline Phosphatase: 86 IU/L (ref 39–117)
Bilirubin Total: 0.6 mg/dL (ref 0.0–1.2)
Bilirubin, Direct: 0.16 mg/dL (ref 0.00–0.40)
Total Protein: 6.5 g/dL (ref 6.0–8.5)

## 2015-09-25 LAB — CBC WITH DIFFERENTIAL/PLATELET
Basophils Absolute: 0 10*3/uL (ref 0.0–0.2)
Basos: 0 %
EOS (ABSOLUTE): 0 10*3/uL (ref 0.0–0.4)
Eos: 1 %
Hematocrit: 40.5 % (ref 34.0–46.6)
Hemoglobin: 13.2 g/dL (ref 11.1–15.9)
Immature Grans (Abs): 0 10*3/uL (ref 0.0–0.1)
Immature Granulocytes: 0 %
Lymphocytes Absolute: 2 10*3/uL (ref 0.7–3.1)
Lymphs: 53 %
MCH: 29 pg (ref 26.6–33.0)
MCHC: 32.6 g/dL (ref 31.5–35.7)
MCV: 89 fL (ref 79–97)
Monocytes Absolute: 0.3 10*3/uL (ref 0.1–0.9)
Monocytes: 7 %
Neutrophils Absolute: 1.5 10*3/uL (ref 1.4–7.0)
Neutrophils: 39 %
Platelets: 272 10*3/uL (ref 150–379)
RBC: 4.55 x10E6/uL (ref 3.77–5.28)
RDW: 14.8 % (ref 12.3–15.4)
WBC: 3.9 10*3/uL (ref 3.4–10.8)

## 2015-09-25 LAB — LIPID PANEL
Chol/HDL Ratio: 3.1 ratio units (ref 0.0–4.4)
Cholesterol, Total: 177 mg/dL (ref 100–199)
HDL: 57 mg/dL (ref >39)
LDL Calculated: 93 mg/dL (ref 0–99)
Triglycerides: 133 mg/dL (ref 0–149)
VLDL Cholesterol Cal: 27 mg/dL (ref 5–40)

## 2015-09-25 LAB — TSH: TSH: 2.2 u[IU]/mL (ref 0.450–4.500)

## 2015-09-26 ENCOUNTER — Encounter: Payer: Self-pay | Admitting: Family Medicine

## 2015-09-28 ENCOUNTER — Encounter: Payer: Self-pay | Admitting: Nurse Practitioner

## 2015-10-03 ENCOUNTER — Ambulatory Visit (HOSPITAL_COMMUNITY)
Admission: RE | Admit: 2015-10-03 | Discharge: 2015-10-03 | Disposition: A | Discharge status: Home / Self Care | Payer: Medicare Other | Source: Ambulatory Visit | Attending: Nurse Practitioner | Admitting: Nurse Practitioner

## 2015-10-03 DIAGNOSIS — M810 Age-related osteoporosis without current pathological fracture: Secondary | ICD-10-CM | POA: Diagnosis not present

## 2015-10-03 DIAGNOSIS — Z780 Asymptomatic menopausal state: Secondary | ICD-10-CM | POA: Insufficient documentation

## 2015-10-11 ENCOUNTER — Other Ambulatory Visit: Payer: Self-pay | Admitting: Family Medicine

## 2015-10-11 ENCOUNTER — Telehealth: Payer: Self-pay | Admitting: Family Medicine

## 2015-10-11 NOTE — Telephone Encounter (Signed)
Six mo worth 

## 2015-10-11 NOTE — Telephone Encounter (Signed)
Left message informing patient that he requested medication was sent into her requested pharmacy.

## 2015-10-11 NOTE — Telephone Encounter (Signed)
Pt is needing refills on her zolpidem (AMBIEN) 10 MG tablet    [facility]

## 2015-11-07 ENCOUNTER — Other Ambulatory Visit: Payer: Self-pay | Admitting: Family Medicine

## 2015-11-07 ENCOUNTER — Telehealth: Payer: Self-pay | Admitting: Family Medicine

## 2015-11-07 NOTE — Telephone Encounter (Signed)
Pt is requesting refills on her LORazepam (ATIVAN) 0.5 MG tablet   [facility]

## 2015-11-07 NOTE — Telephone Encounter (Signed)
Just seen 6 mo worth

## 2015-11-07 NOTE — Telephone Encounter (Signed)
Spoke with patient and informed her per Dr.Steve Luking- we are sending in refills on Ativan. Patient verbalized understanding.

## 2015-11-07 NOTE — Telephone Encounter (Signed)
Six mo as noted in pt call

## 2015-11-21 ENCOUNTER — Other Ambulatory Visit (HOSPITAL_COMMUNITY): Payer: Self-pay | Admitting: Neurosurgery

## 2015-11-21 DIAGNOSIS — M5416 Radiculopathy, lumbar region: Secondary | ICD-10-CM

## 2015-11-26 ENCOUNTER — Ambulatory Visit (HOSPITAL_COMMUNITY)
Admission: RE | Admit: 2015-11-26 | Discharge: 2015-11-26 | Disposition: A | Discharge status: Home / Self Care | Payer: Medicare Other | Source: Ambulatory Visit | Attending: Neurosurgery | Admitting: Neurosurgery

## 2015-11-26 DIAGNOSIS — M1288 Other specific arthropathies, not elsewhere classified, other specified site: Secondary | ICD-10-CM | POA: Insufficient documentation

## 2015-11-26 DIAGNOSIS — M48061 Spinal stenosis, lumbar region without neurogenic claudication: Secondary | ICD-10-CM | POA: Insufficient documentation

## 2015-11-26 DIAGNOSIS — M5416 Radiculopathy, lumbar region: Secondary | ICD-10-CM | POA: Diagnosis not present

## 2015-11-29 ENCOUNTER — Ambulatory Visit (INDEPENDENT_AMBULATORY_CARE_PROVIDER_SITE_OTHER): Payer: Medicare Other | Admitting: Family Medicine

## 2015-11-29 ENCOUNTER — Encounter: Payer: Self-pay | Admitting: Family Medicine

## 2015-11-29 VITALS — BP 126/90 | Temp 97.7°F | Wt 135.2 lb

## 2015-11-29 DIAGNOSIS — G500 Trigeminal neuralgia: Secondary | ICD-10-CM

## 2015-11-29 MED ORDER — PREDNISONE 20 MG PO TABS: ORAL_TABLET | ORAL | 0 refills | Status: DC

## 2015-11-29 NOTE — Progress Notes (Signed)
   Subjective:    Patient ID: Katherine Lane, female    DOB: 1932-04-23, 82 y.o.   MRN: [id number]  Otalgia   There is pain in both ears. This is a new problem. The current episode started in the past 7 days.   Patient states no other concerns this visit.  Patient like sharp pain some bitemporal region they go through the year and into the face around the eye denies any steady pain these are more sharp lancing pains that have been fairly frequent over the past few days  Review of Systems  HENT: Positive for ear pain.   Denies headache drainage coughing wheezing fevers chills unilateral numbness or weakness     Objective:   Physical Exam  Ears right ear shows evidence of previous surgery left ear normal throat normal neck no masses lungs clear    Assessment & Plan:  I believe this is trigeminal neuralgia. Short course of prednisone if she is not dramatically better by Monday I would recommend Tegretol she will call us with an update in a few days Tylenol as needed for pain and avoid overusage

## 2015-12-06 ENCOUNTER — Emergency Department (HOSPITAL_COMMUNITY): Payer: Medicare Other

## 2015-12-06 ENCOUNTER — Encounter (HOSPITAL_COMMUNITY): Payer: Self-pay

## 2015-12-06 ENCOUNTER — Observation Stay (HOSPITAL_COMMUNITY)
Admission: EM | Admit: 2015-12-06 | Discharge: 2015-12-07 | Disposition: A | Discharge status: Home / Self Care | Payer: Medicare Other | Attending: Internal Medicine | Admitting: Internal Medicine

## 2015-12-06 ENCOUNTER — Telehealth: Payer: Self-pay | Admitting: Cardiovascular Disease

## 2015-12-06 DIAGNOSIS — Z79899 Other long term (current) drug therapy: Secondary | ICD-10-CM | POA: Insufficient documentation

## 2015-12-06 DIAGNOSIS — I480 Paroxysmal atrial fibrillation: Secondary | ICD-10-CM | POA: Diagnosis not present

## 2015-12-06 DIAGNOSIS — E860 Dehydration: Secondary | ICD-10-CM | POA: Diagnosis not present

## 2015-12-06 DIAGNOSIS — R4182 Altered mental status, unspecified: Secondary | ICD-10-CM | POA: Diagnosis present

## 2015-12-06 DIAGNOSIS — I10 Essential (primary) hypertension: Secondary | ICD-10-CM | POA: Diagnosis not present

## 2015-12-06 DIAGNOSIS — E871 Hypo-osmolality and hyponatremia: Secondary | ICD-10-CM | POA: Diagnosis not present

## 2015-12-06 DIAGNOSIS — Z5181 Encounter for therapeutic drug level monitoring: Secondary | ICD-10-CM | POA: Diagnosis not present

## 2015-12-06 DIAGNOSIS — E876 Hypokalemia: Secondary | ICD-10-CM | POA: Diagnosis present

## 2015-12-06 DIAGNOSIS — R55 Syncope and collapse: Secondary | ICD-10-CM

## 2015-12-06 DIAGNOSIS — R9431 Abnormal electrocardiogram [ECG] [EKG]: Secondary | ICD-10-CM | POA: Diagnosis present

## 2015-12-06 DIAGNOSIS — R42 Dizziness and giddiness: Secondary | ICD-10-CM

## 2015-12-06 HISTORY — DX: Trigeminal neuralgia: G50.0

## 2015-12-06 LAB — RAPID URINE DRUG SCREEN, HOSP PERFORMED
Amphetamines: NOT DETECTED
Barbiturates: NOT DETECTED
Benzodiazepines: NOT DETECTED
Cocaine: NOT DETECTED
Opiates: NOT DETECTED
Tetrahydrocannabinol: NOT DETECTED

## 2015-12-06 LAB — MAGNESIUM: Magnesium: 2.1 mg/dL (ref 1.7–2.4)

## 2015-12-06 LAB — CBC WITH DIFFERENTIAL/PLATELET
Basophils Absolute: 0 10*3/uL (ref 0.0–0.1)
Basophils Relative: 0 %
Eosinophils Absolute: 0 10*3/uL (ref 0.0–0.7)
Eosinophils Relative: 0 %
HCT: 42.6 % (ref 36.0–46.0)
Hemoglobin: 14.7 g/dL (ref 12.0–15.0)
Lymphocytes Relative: 39 %
Lymphs Abs: 4.7 10*3/uL — ABNORMAL HIGH (ref 0.7–4.0)
MCH: 30.1 pg (ref 26.0–34.0)
MCHC: 34.5 g/dL (ref 30.0–36.0)
MCV: 87.1 fL (ref 78.0–100.0)
Monocytes Absolute: 1.2 10*3/uL — ABNORMAL HIGH (ref 0.1–1.0)
Monocytes Relative: 10 %
Neutro Abs: 6 10*3/uL (ref 1.7–7.7)
Neutrophils Relative %: 50 %
Platelets: 406 10*3/uL — ABNORMAL HIGH (ref 150–400)
RBC: 4.89 MIL/uL (ref 3.87–5.11)
RDW: 13.7 % (ref 11.5–15.5)
WBC: 11.9 10*3/uL — ABNORMAL HIGH (ref 4.0–10.5)

## 2015-12-06 LAB — COMPREHENSIVE METABOLIC PANEL
ALT: 20 U/L (ref 14–54)
AST: 22 U/L (ref 15–41)
Albumin: 4.1 g/dL (ref 3.5–5.0)
Alkaline Phosphatase: 56 U/L (ref 38–126)
Anion gap: 10 (ref 5–15)
BUN: 21 mg/dL — ABNORMAL HIGH (ref 6–20)
CO2: 25 mmol/L (ref 22–32)
Calcium: 9 mg/dL (ref 8.9–10.3)
Chloride: 91 mmol/L — ABNORMAL LOW (ref 101–111)
Creatinine, Ser: 0.87 mg/dL (ref 0.44–1.00)
GFR calc Af Amer: >60 mL/min (ref >60)
GFR calc non Af Amer: >60 mL/min (ref >60)
Glucose, Bld: 128 mg/dL — ABNORMAL HIGH (ref 65–99)
Potassium: 2.8 mmol/L — ABNORMAL LOW (ref 3.5–5.1)
Sodium: 126 mmol/L — ABNORMAL LOW (ref 135–145)
Total Bilirubin: 1.2 mg/dL (ref 0.3–1.2)
Total Protein: 6.6 g/dL (ref 6.5–8.1)

## 2015-12-06 LAB — PROTIME-INR
INR: 1.09
Prothrombin Time: 14.1 seconds (ref 11.4–15.2)

## 2015-12-06 LAB — URINALYSIS, ROUTINE W REFLEX MICROSCOPIC
Bilirubin Urine: NEGATIVE
Glucose, UA: NEGATIVE mg/dL
Hgb urine dipstick: NEGATIVE
Ketones, ur: NEGATIVE mg/dL
Leukocytes, UA: NEGATIVE
Nitrite: NEGATIVE
Protein, ur: NEGATIVE mg/dL
Specific Gravity, Urine: 1.02 (ref 1.005–1.030)
pH: 6.5 (ref 5.0–8.0)

## 2015-12-06 LAB — I-STAT TROPONIN, ED: Troponin i, poc: 0.03 ng/mL (ref 0.00–0.08)

## 2015-12-06 LAB — TSH: TSH: 1.845 u[IU]/mL (ref 0.350–4.500)

## 2015-12-06 LAB — APTT: aPTT: 22 seconds — ABNORMAL LOW (ref 24–36)

## 2015-12-06 LAB — ETHANOL: Alcohol, Ethyl (B): 6 mg/dL — ABNORMAL HIGH (ref <5)

## 2015-12-06 LAB — I-STAT CG4 LACTIC ACID, ED: Lactic Acid, Venous: 2.66 mmol/L (ref 0.5–1.9)

## 2015-12-06 LAB — CBG MONITORING, ED: Glucose-Capillary: 121 mg/dL — ABNORMAL HIGH (ref 65–99)

## 2015-12-06 LAB — TROPONIN I: Troponin I: 0.03 ng/mL (ref <0.03)

## 2015-12-06 MED ORDER — ACETAMINOPHEN 650 MG RE SUPP: 650.0000 mg | Freq: Four times a day (QID) | RECTAL | Status: DC | PRN

## 2015-12-06 MED ORDER — ACETAMINOPHEN 325 MG PO TABS: 650.0000 mg | ORAL_TABLET | Freq: Four times a day (QID) | ORAL | Status: DC | PRN

## 2015-12-06 MED ORDER — ENALAPRIL MALEATE 5 MG PO TABS
20.0000 mg | ORAL_TABLET | Freq: Two times a day (BID) | ORAL | Status: DC
  Administered 2015-12-07: 20 mg via ORAL
  Filled 2015-12-06 (×2): qty 4

## 2015-12-06 MED ORDER — METOPROLOL SUCCINATE ER 25 MG PO TB24
25.0000 mg | ORAL_TABLET | Freq: Every day | ORAL | Status: DC
  Administered 2015-12-07: 25 mg via ORAL
  Filled 2015-12-06: qty 1

## 2015-12-06 MED ORDER — SODIUM CHLORIDE 0.9 % IV SOLN
INTRAVENOUS | Status: DC
  Administered 2015-12-06: 12:00:00 via INTRAVENOUS

## 2015-12-06 MED ORDER — POTASSIUM CHLORIDE 10 MEQ/100ML IV SOLN
10.0000 meq | Freq: Once | INTRAVENOUS | Status: AC
  Administered 2015-12-06: 10 meq via INTRAVENOUS
  Filled 2015-12-06: qty 100

## 2015-12-06 MED ORDER — VERAPAMIL HCL ER 240 MG PO TBCR
240.0000 mg | EXTENDED_RELEASE_TABLET | Freq: Every day | ORAL | Status: DC
  Administered 2015-12-07: 240 mg via ORAL
  Filled 2015-12-06: qty 1

## 2015-12-06 MED ORDER — SODIUM CHLORIDE 0.9 % IV SOLN: INTRAVENOUS | Status: DC

## 2015-12-06 MED ORDER — LORAZEPAM 0.5 MG PO TABS
0.5000 mg | ORAL_TABLET | Freq: Every day | ORAL | Status: DC | PRN
  Administered 2015-12-07: 0.5 mg via ORAL
  Filled 2015-12-06: qty 1

## 2015-12-06 MED ORDER — ZOLPIDEM TARTRATE 5 MG PO TABS
5.0000 mg | ORAL_TABLET | Freq: Every day | ORAL | Status: DC
  Administered 2015-12-06: 5 mg via ORAL
  Filled 2015-12-06: qty 1

## 2015-12-06 MED ORDER — ONDANSETRON HCL 4 MG/2ML IJ SOLN
4.0000 mg | Freq: Once | INTRAMUSCULAR | Status: AC
  Administered 2015-12-06: 4 mg via INTRAVENOUS

## 2015-12-06 MED ORDER — POTASSIUM CHLORIDE CRYS ER 20 MEQ PO TBCR
40.0000 meq | EXTENDED_RELEASE_TABLET | ORAL | Status: AC
  Administered 2015-12-06 (×2): 40 meq via ORAL
  Filled 2015-12-06 (×2): qty 2

## 2015-12-06 MED ORDER — PROCHLORPERAZINE EDISYLATE 5 MG/ML IJ SOLN: 10.0000 mg | Freq: Four times a day (QID) | INTRAMUSCULAR | Status: DC | PRN

## 2015-12-06 MED ORDER — POTASSIUM CHLORIDE IN NACL 40-0.9 MEQ/L-% IV SOLN
INTRAVENOUS | Status: DC
  Administered 2015-12-06 – 2015-12-07 (×2): 100 mL/h via INTRAVENOUS

## 2015-12-06 MED ORDER — ONDANSETRON HCL 4 MG/2ML IJ SOLN
INTRAMUSCULAR | Status: AC
  Filled 2015-12-06: qty 2

## 2015-12-06 MED ORDER — RIVAROXABAN 20 MG PO TABS
20.0000 mg | ORAL_TABLET | Freq: Every day | ORAL | Status: DC
  Administered 2015-12-06 – 2015-12-07 (×2): 20 mg via ORAL
  Filled 2015-12-06 (×2): qty 1

## 2015-12-06 NOTE — ED Notes (Signed)
MD at bedside. 

## 2015-12-06 NOTE — ED Notes (Signed)
Daughter says she spoke to her at 0845 this morning and says she "sounded great."  Reports she had been feeling bad all week with her neuralgia and told her daughter she felt so much better today.

## 2015-12-06 NOTE — ED Notes (Signed)
Pt ready and stable for transport to [facility].

## 2015-12-06 NOTE — H&P (Signed)
History and Physical    Katherine Lane ZOX:[id number] DOB: March 28, 1932 DOA: 12/06/2015  PCP: Lubertha South, MD  Patient coming from: home  Chief Complaint: near syncope  HPI: Katherine Lane is a 82 y.o. female with medical history significant of hypertension, atrial fibrillation on anticoagulation, was recently diagnosed with trigeminal neuralgia by her primary care physician and treated with a course of prednisone. Patient reports that the prednisone had significantly improved her neuralgia, but was causing her blood pressure to be elevated. Over the past week, she's been using hydrochlorothiazide to help manage her blood pressure. She reports having intermittent "weak spells" over the past few days. This morning she reported to feel quite well when she spoke to her daughter over the phone at 8:00am. She had gone into the shower and all of a sudden became extremely weak. She felt that her vision was starting to become dark. She did not completely lose her vision, post having a difficult time seeing. She laid on the floor and the bathroom and eventually crawled back to her bed. She called her daughter who check on her within 10 minutes. Upon the daughter's arrival, she told her daughter that her vision was starting to improve. She began developing nausea. She was brought to the ER for evaluation where she began to have episodes of vomiting. This was followed by transient confusion where she did not recognize family members. She denies any numbness or unilateral weakness. Her family did not note any slurring of her speech. After coming to the emergency room, confusion spontaneously improved and she is back to her baseline. Vision is also improved. She's been referred for further workup.  ED Course: Basic chemistry showed hyponatremia with serum sodium of 126. Hypokalemia with a serum potassium of 2.8. EKG showed atrial fibrillation with a prolonged QTC. MRI of the brain did not show any evidence of stroke.  Urinalysis did not show any signs of infection. Code stroke was called on patient's arrival and she was seen by teleneurology. She is not felt to be candidate for TPA since she is already anticoagulated.  Review of Systems: As per HPI otherwise 10 point review of systems negative.    Past Medical History:  Diagnosis Date  . A-fib (HCC)   . Anxiety   . Arthritis   . Chronic back pain   . Constipation, chronic   . Depression   . HTN (hypertension)   . Insomnia   . Migraine headache   . Osteoporosis   . PONV (postoperative nausea and vomiting)   . Trigeminal neuralgia     Past Surgical History:  Procedure Laterality Date  . ANKLE FRACTURE SURGERY     2010-right  . CARDIAC CATHETERIZATION     denies  . CATARACT EXTRACTION W/PHACO Right 07/22/2012   Procedure: CATARACT EXTRACTION PHACO AND INTRAOCULAR LENS PLACEMENT (IOC);  Surgeon: Gemma Payor, MD;  Location: [facility];  Service: Ophthalmology;  Laterality: Right;  CDE:  18.93  . CATARACT EXTRACTION W/PHACO Left 08/09/2012   Procedure: CATARACT EXTRACTION PHACO AND INTRAOCULAR LENS PLACEMENT (IOC);  Surgeon: Gemma Payor, MD;  Location: [facility];  Service: Ophthalmology;  Laterality: Left;  CDE: 17.21  . COLONOSCOPY    . HARDWARE REMOVAL  07/18/2011   Procedure: HARDWARE REMOVAL;  Surgeon: Vickki Hearing, MD;  Location: [facility];  Service: Orthopedics;  Laterality: Right;  . NERVE REPAIR  07/18/2011   Procedure: NERVE REPAIR;  Surgeon: Vickki Hearing, MD;  Location: [facility];  Service: Orthopedics;  Laterality:  Right;  Right leg superficial peroneal nerve release    . PARTIAL HYSTERECTOMY       reports that she has never smoked. She has never used smokeless tobacco. She reports that she does not drink alcohol or use drugs.  Allergies  Allergen Reactions  . Codeine Nausea And Vomiting    Patient states intolerance to all pain medications  . Tramadol     Family History  Problem Relation Age of Onset  . Alzheimer's disease    .  Cancer    . Hypertension Mother   . Hypertension Father   . Anesthesia problems Neg Hx   . Hypotension Neg Hx   . Malignant hyperthermia Neg Hx   . Pseudochol deficiency Neg Hx      Prior to Admission medications   Medication Sig Start Date End Date Taking? Authorizing Provider  enalapril (VASOTEC) 20 MG tablet TAKE (1) TABLET BY MOUTH TWICE DAILY. 03/26/15  Yes Laqueta Linden, MD  LORazepam (ATIVAN) 0.5 MG tablet TAKE (1) TABLET DAILY AS NEEDED. 11/07/15  Yes Merlyn Albert, MD  metoprolol succinate (TOPROL-XL) 25 MG 24 hr tablet TAKE ONE TABLET BY MOUTH ONCE DAILY. 07/23/15  Yes Laqueta Linden, MD  verapamil (CALAN-SR) 240 MG CR tablet TAKE (1) TABLET BY MOUTH EACH MORNING. 04/09/15  Yes Merlyn Albert, MD  XARELTO 20 MG TABS tablet TAKE 1 TABLET DAILY WITH SUPPER. 06/18/15  Yes Laqueta Linden, MD  zolpidem (AMBIEN) 10 MG tablet TAKE 1 TABLET BY MOUTH AT BEDTIME FOR SLEEP. 10/11/15  Yes Merlyn Albert, MD    Physical Exam: Vitals:   12/06/15 1400 12/06/15 1415 12/06/15 1522 12/06/15 1736  BP: 166/99 150/69 (!) 109/46   Pulse: 81 72 76 (!) 57  Resp: 13 18 18    Temp:   98.5 F (36.9 C)   TempSrc:   Oral   SpO2: 96% 95% 96%   Weight:   62.1 kg (136 lb 12.8 oz)   Height:   5\' 2"  (1.575 m)       Constitutional: NAD, calm, comfortable Vitals:   12/06/15 1400 12/06/15 1415 12/06/15 1522 12/06/15 1736  BP: 166/99 150/69 (!) 109/46   Pulse: 81 72 76 (!) 57  Resp: 13 18 18    Temp:   98.5 F (36.9 C)   TempSrc:   Oral   SpO2: 96% 95% 96%   Weight:   62.1 kg (136 lb 12.8 oz)   Height:   5\' 2"  (1.575 m)    Eyes: PERRL, lids and conjunctivae normal ENMT: Mucous membranes are moist. Posterior pharynx clear of any exudate or lesions.Normal dentition.  Neck: normal, supple, no masses, no thyromegaly Respiratory: clear to auscultation bilaterally, no wheezing, no crackles. Normal respiratory effort. No accessory muscle use.  Cardiovascular: Regular rate and rhythm, no  murmurs / rubs / gallops. No extremity edema. 2+ pedal pulses. No carotid bruits.  Abdomen: no tenderness, no masses palpated. No hepatosplenomegaly. Bowel sounds positive.  Musculoskeletal: no clubbing / cyanosis. No joint deformity upper and lower extremities. Good ROM, no contractures. Normal muscle tone.  Skin: no rashes, lesions, ulcers. No induration Neurologic: CN 2-12 grossly intact. Sensation intact, DTR normal. Strength 5/5 in all 4.  Psychiatric: Normal judgment and insight. Alert and oriented x 3. Normal mood.    Labs on Admission: I have personally reviewed following labs and imaging studies  CBC:  Recent Labs Lab 12/06/15 1109  WBC 11.9*  NEUTROABS 6.0  HGB 14.7  HCT 42.6  MCV 87.1  PLT 406*   Basic Metabolic Panel:  Recent Labs Lab 12/06/15 1109 12/06/15 1141  NA 126*  --   K 2.8*  --   CL 91*  --   CO2 25  --   GLUCOSE 128*  --   BUN 21*  --   CREATININE 0.87  --   CALCIUM 9.0  --   MG  --  2.1   GFR: Estimated Creatinine Clearance: 44.7 mL/min (by C-G formula based on SCr of 0.87 mg/dL). Liver Function Tests:  Recent Labs Lab 12/06/15 1109  AST 22  ALT 20  ALKPHOS 56  BILITOT 1.2  PROT 6.6  ALBUMIN 4.1   No results for input(s): LIPASE, AMYLASE in the last 168 hours. No results for input(s): AMMONIA in the last 168 hours. Coagulation Profile:  Recent Labs Lab 12/06/15 1109  INR 1.09   Cardiac Enzymes: No results for input(s): CKTOTAL, CKMB, CKMBINDEX, TROPONINI in the last 168 hours. BNP (last 3 results) No results for input(s): PROBNP in the last 8760 hours. HbA1C: No results for input(s): HGBA1C in the last 72 hours. CBG:  Recent Labs Lab 12/06/15 1106  GLUCAP 121*   Lipid Profile: No results for input(s): CHOL, HDL, LDLCALC, TRIG, CHOLHDL, LDLDIRECT in the last 72 hours. Thyroid Function Tests: No results for input(s): TSH, T4TOTAL, FREET4, T3FREE, THYROIDAB in the last 72 hours. Anemia Panel: No results for input(s):  VITAMINB12, FOLATE, FERRITIN, TIBC, IRON, RETICCTPCT in the last 72 hours. Urine analysis:    Component Value Date/Time   COLORURINE YELLOW 12/06/2015 1117   APPEARANCEUR CLEAR 12/06/2015 1117   LABSPEC 1.020 12/06/2015 1117   PHURINE 6.5 12/06/2015 1117   GLUCOSEU NEGATIVE 12/06/2015 1117   HGBUR NEGATIVE 12/06/2015 1117   BILIRUBINUR NEGATIVE 12/06/2015 1117   KETONESUR NEGATIVE 12/06/2015 1117   PROTEINUR NEGATIVE 12/06/2015 1117   UROBILINOGEN 0.2 02/15/2009 1217   NITRITE NEGATIVE 12/06/2015 1117   LEUKOCYTESUR NEGATIVE 12/06/2015 1117   Sepsis Labs: !!!!!!!!!!!!!!!!!!!!!!!!!!!!!!!!!!!!!!!!!!!! @LABRCNTIP (procalcitonin:4,lacticidven:4) )No results found for this or any previous visit (from the past 240 hour(s)).   Radiological Exams on Admission: Ct Cervical Spine Wo Contrast  Result Date: 12/06/2015 CLINICAL DATA:  82 year old female with syncope at 1015 hours. Subsequent confusion. Initial encounter. EXAM: CT CERVICAL SPINE WITHOUT CONTRAST TECHNIQUE: Multidetector CT imaging of the cervical spine was performed without intravenous contrast. Multiplanar CT image reconstructions were also generated. COMPARISON:  Head CT from the same day reported separately. FINDINGS: Alignment: Straightening of cervical lordosis. Mild retrolisthesis of C5 on C6. Cervicothoracic junction alignment is within normal limits. Bilateral posterior element alignment is within normal limits. Skull base and vertebrae: Osteopenia. Visualized skull base is intact. No atlanto-occipital dissociation. No cervical spine fracture identified. Soft tissues and spinal canal: No prevertebral fluid or swelling. No visible canal hematoma. Motion artifact at the hypopharynx. Negative noncontrast neck soft tissues. Disc levels: Lower cervical disc and endplate degeneration. No cervical spinal stenosis. Upper chest: Visible upper thoracic levels appear intact. Negative lung apices. IMPRESSION: 1.  No acute fracture or listhesis  identified in the cervical spine. 2. CT head reported separately. Electronically Signed   By: Odessa Fleming M.D.   On: 12/06/2015 11:42   Mr Brain Wo Contrast (neuro Protocol)  Result Date: 12/06/2015 CLINICAL DATA:  Syncopal episode in bathroom, transient blindness. Confusion. Concurrent severe hypertension. History of migraine headaches and trigeminal neuralgia, atrial fibrillation. EXAM: MRI HEAD WITHOUT CONTRAST MRA HEAD WITHOUT CONTRAST TECHNIQUE: Multiplanar, multiecho pulse sequences of the brain and surrounding structures  were obtained without intravenous contrast. Angiographic images of the head were obtained using MRA technique without contrast. COMPARISON:  CT HEAD December 06, 2015 at 1114 hours FINDINGS: MRI HEAD FINDINGS BRAIN: No reduced diffusion to suggest acute ischemia. No susceptibility artifact to suggest hemorrhage. The ventricles and sulci are normal for patient's age. Patchy to confluent supratentorial white matter FLAIR T2 hyperintensities including disproportion subcortical white matter involvement. No suspicious parenchymal signal, masses or mass effect. No abnormal extra-axial fluid collections. No extra-axial masses though, contrast enhanced sequences would be more sensitive. VASCULAR: Normal major intracranial vascular flow voids present at skull base. SKULL AND UPPER CERVICAL SPINE: No abnormal sellar expansion. No suspicious calvarial bone marrow signal. Craniocervical junction maintained. SINUSES/ORBITS: The mastoid air-cells and included paranasal sinuses are well-aerated. Status post bilateral ocular lens implants. The included ocular globes and orbital contents are non-suspicious. OTHER: None. MRA HEAD FINDINGS- signal loss favored to reflect patient motion. ANTERIOR CIRCULATION: Flow related enhancement of the included cervical, petrous, cavernous and supraclinoid internal carotid arteries. Narrowed LEFT cavernous internal carotid artery flow void favoring artifact. Patent anterior  communicating artery. Normal flow related enhancement of the anterior and middle cerebral arteries, including distal segments. No large vessel occlusion, high-grade stenosis, abnormal luminal irregularity, aneurysm. POSTERIOR CIRCULATION: Codominant vertebral artery's. Basilar artery is patent, with normal flow related enhancement of the main branch vessels. Normal flow related enhancement of the posterior cerebral arteries. Mild stenosis RIGHT P1 2 junction versus flow artifact from tortuosity. No large vessel occlusion, high-grade stenosis, abnormal luminal irregularity, aneurysm. IMPRESSION: MRI HEAD: No acute intracranial process. Moderate to severe white matter changes most compatible with chronic small vessel ischemic disease. Considering distribution of white matter changes, superimposed chronic changes from trauma, old infection or vasculopathy are possible. MRA HEAD: No emergent large vessel occlusion or severe stenosis. Electronically Signed   By: Awilda Metroourtnay  Bloomer M.D.   On: 12/06/2015 15:03   Mr Maxine GlennMra Head (cerebral Arteries)  Result Date: 12/06/2015 CLINICAL DATA:  Syncopal episode in bathroom, transient blindness. Confusion. Concurrent severe hypertension. History of migraine headaches and trigeminal neuralgia, atrial fibrillation. EXAM: MRI HEAD WITHOUT CONTRAST MRA HEAD WITHOUT CONTRAST TECHNIQUE: Multiplanar, multiecho pulse sequences of the brain and surrounding structures were obtained without intravenous contrast. Angiographic images of the head were obtained using MRA technique without contrast. COMPARISON:  CT HEAD December 06, 2015 at 1114 hours FINDINGS: MRI HEAD FINDINGS BRAIN: No reduced diffusion to suggest acute ischemia. No susceptibility artifact to suggest hemorrhage. The ventricles and sulci are normal for patient's age. Patchy to confluent supratentorial white matter FLAIR T2 hyperintensities including disproportion subcortical white matter involvement. No suspicious parenchymal  signal, masses or mass effect. No abnormal extra-axial fluid collections. No extra-axial masses though, contrast enhanced sequences would be more sensitive. VASCULAR: Normal major intracranial vascular flow voids present at skull base. SKULL AND UPPER CERVICAL SPINE: No abnormal sellar expansion. No suspicious calvarial bone marrow signal. Craniocervical junction maintained. SINUSES/ORBITS: The mastoid air-cells and included paranasal sinuses are well-aerated. Status post bilateral ocular lens implants. The included ocular globes and orbital contents are non-suspicious. OTHER: None. MRA HEAD FINDINGS- signal loss favored to reflect patient motion. ANTERIOR CIRCULATION: Flow related enhancement of the included cervical, petrous, cavernous and supraclinoid internal carotid arteries. Narrowed LEFT cavernous internal carotid artery flow void favoring artifact. Patent anterior communicating artery. Normal flow related enhancement of the anterior and middle cerebral arteries, including distal segments. No large vessel occlusion, high-grade stenosis, abnormal luminal irregularity, aneurysm. POSTERIOR CIRCULATION: Codominant vertebral artery's. Basilar artery is  patent, with normal flow related enhancement of the main branch vessels. Normal flow related enhancement of the posterior cerebral arteries. Mild stenosis RIGHT P1 2 junction versus flow artifact from tortuosity. No large vessel occlusion, high-grade stenosis, abnormal luminal irregularity, aneurysm. IMPRESSION: MRI HEAD: No acute intracranial process. Moderate to severe white matter changes most compatible with chronic small vessel ischemic disease. Considering distribution of white matter changes, superimposed chronic changes from trauma, old infection or vasculopathy are possible. MRA HEAD: No emergent large vessel occlusion or severe stenosis. Electronically Signed   By: Awilda Metro M.D.   On: 12/06/2015 15:03   Ct Head Code Stroke W/o Cm  Result Date:  12/06/2015 CLINICAL DATA:  Code stroke. 82 year old female with syncope at 1015 hours. Subsequent confusion. Initial encounter. EXAM: CT HEAD WITHOUT CONTRAST TECHNIQUE: Contiguous axial images were obtained from the base of the skull through the vertex without intravenous contrast. COMPARISON:  Head CT without contrast 02/26/2015 FINDINGS: Brain: Patchy and widespread chronic white matter disease in both hemispheres appears stable. Temporal lobe involvement, especially the anterior left temporal lobe, appears severe, but not definitely different than on the prior study. No acute intracranial hemorrhage identified. No midline shift, mass effect, or evidence of intracranial mass lesion. No ventriculomegaly. No cortically based acute infarct identified. Vascular: Calcified atherosclerosis at the skull base. No suspicious intracranial vascular hyperdensity. Skull: No acute osseous abnormality identified. Sinuses/Orbits: Visualized paranasal sinuses and mastoids are stable and well pneumatized. Other: No acute orbit or scalp soft tissue findings. ASPECTS [facility] Stroke Program Early CT Score) - Ganglionic level infarction (caudate, lentiform nuclei, internal capsule, insula, M1-M3 cortex): 7 - Supraganglionic infarction (M4-M6 cortex): 3 Total score (0-10 with 10 being normal): 10 IMPRESSION: 1. No acute cortically based infarct or acute intracranial hemorrhage identified. 2. Advanced chronic white matter disease not definitely progressed since January. Left greater than right temporal lobe involvement noted. 3. ASPECTS is 10. 4. Study discussed by telephone with Dr. Samuel Jester on 12/06/2015 at 11:38 . Electronically Signed   By: Odessa Fleming M.D.   On: 12/06/2015 11:39    EKG: Independently reviewed. A fib with prolonged QTc  Assessment/Plan Active Problems:   Essential hypertension   Atrial fibrillation (HCC)   Altered mental status   Hyponatremia   Hypokalemia   Dehydration   Prolonged Q-T interval on  ECG   Postural dizziness with presyncope    1. Transient confusion with transient decrease in vision. MRI does not show any evidence of stroke. She does have evidence of volume depletion with hyponatremia and elevated lactic acid. Possibly related to dehydration. Symptoms may be related to vagal response. She does not have any obvious source of infection. Could also be related to electrode abnormalities and prolonged QT. Monitor on telemetry overnight. Replace electrolytes. Will consult neurology to see if any further ischemic workup is necessary.  2. Hyponatremia. Likely related to hydrochlorothiazide. We'll start on intravenous saline.  3. Hypokalemia. Likely related to hydrochlorothiazide. Start replacement.  4. Paroxysmal atrial fibrillation. Continue on calcium channel blockers. She is anticoagulated with Xarelto .  5. Hypertension. Continue home regimen.  6. Recent trigeminal neuralgia. Improved with steroids. She has completed her course of prednisone.    DVT prophylaxis: Xarelto Code Status: full Family Communication: discussed with daughters at the bedside Disposition Plan: discharge home once improved Consults called: neurology Admission status: observation, telemetry   Gearld Kerstein MD [facility] Pager [phone]  If 7PM-7AM, please contact night-coverage [email or web address] Password [facility]  12/06/2015, 5:39 PM

## 2015-12-06 NOTE — ED Triage Notes (Signed)
Daughter reports pt called her daughter at [id number]  because she "semi-passed out" in the bathroom floor.  Says she told her daughter she went blind for a few minutes.   Daughter says when she arrived pt could see again and she helped her to her bed then started being confused.  Daughter says pt has been on a steroid for neuralgia since last Thursday.   Also reports is being treated for severe htn.

## 2015-12-06 NOTE — ED Provider Notes (Signed)
[facility]-EMERGENCY DEPT Provider Note   CSN: [id number] Arrival date & time: 12/06/15  1050   An emergency department physician performed an initial assessment on this suspected stroke patient at 1110.  History   Chief Complaint Chief Complaint  Patient presents with  . Near Syncope  . Altered Mental Status    HPI Katherine Lane is a 82 y.o. female.  The history is provided by the patient and a relative. The history is limited by the condition of the patient (AMS).  Near Syncope   Altered Mental Status    Pt was seen at 1105.  Per pt's daughter: Daughter spoke to pt at 8am and 9am, pt "sounded great" and "said she felt good." Pt told daughter of her plans for the day. Pt's daughter called back approximately 10am and pt told her she "was on the floor" in the bathroom because she "semi-passed out." Pt told daughter she "couldn't see." Pt's daughter arrived to pt's home and pt told her she "could see again" but was nauseated. Daughter felt pt was confused and continues confused on arrival to ED. Pt currently denies any visual changes.    Past Medical History:  Diagnosis Date  . A-fib (HCC)   . Anxiety   . Arthritis   . Chronic back pain   . Constipation, chronic   . Depression   . HTN (hypertension)   . Insomnia   . Migraine headache   . Osteoporosis   . PONV (postoperative nausea and vomiting)   . Trigeminal neuralgia     Patient Active Problem List   Diagnosis Date Noted  . Atrial fibrillation (HCC) 07/14/2013  . Atrial flutter (HCC) 05/12/2013  . Bulging discs 04/20/2013  . Trigeminal neuralgia 01/15/2013  . Cellulitis 07/31/2011  . Mechanical complication of internal orthopedic implant (HCC) 07/21/2011  . 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 Ankle fracture 07/02/2010  . LESION OF PLANTAR NERVE 05/14/2009  . CLOSED BIMALLEOLAR FRACTURE 03/01/2009  . Essential hypertension 11/07/2008  . CONSTIPATION, CHRONIC 11/07/2008  . HEMATOCHEZIA 11/07/2008  .  Osteoporosis 11/07/2008    Past Surgical History:  Procedure Laterality Date  . ANKLE FRACTURE SURGERY     2010-right  . CARDIAC CATHETERIZATION     denies  . CATARACT EXTRACTION W/PHACO Right 07/22/2012   Procedure: CATARACT EXTRACTION PHACO AND INTRAOCULAR LENS PLACEMENT (IOC);  Surgeon: Gemma Payor, MD;  Location: [facility];  Service: Ophthalmology;  Laterality: Right;  CDE:  18.93  . CATARACT EXTRACTION W/PHACO Left 08/09/2012   Procedure: CATARACT EXTRACTION PHACO AND INTRAOCULAR LENS PLACEMENT (IOC);  Surgeon: Gemma Payor, MD;  Location: [facility];  Service: Ophthalmology;  Laterality: Left;  CDE: 17.21  . COLONOSCOPY    . HARDWARE REMOVAL  07/18/2011   Procedure: HARDWARE REMOVAL;  Surgeon: Vickki Hearing, MD;  Location: [facility];  Service: Orthopedics;  Laterality: Right;  . NERVE REPAIR  07/18/2011   Procedure: NERVE REPAIR;  Surgeon: Vickki Hearing, MD;  Location: [facility];  Service: Orthopedics;  Laterality: Right;  Right leg superficial peroneal nerve release    . PARTIAL HYSTERECTOMY      OB History    Gravida Para Term Preterm AB Living             3   SAB TAB Ectopic Multiple Live Births                   Home Medications    Prior to Admission medications   Medication Sig Start Date End  Date Taking? Authorizing Provider  enalapril (VASOTEC) 20 MG tablet TAKE (1) TABLET BY MOUTH TWICE DAILY. 03/26/15  Yes Laqueta Linden, MD  LORazepam (ATIVAN) 0.5 MG tablet TAKE (1) TABLET DAILY AS NEEDED. 11/07/15  Yes Merlyn Albert, MD  metoprolol succinate (TOPROL-XL) 25 MG 24 hr tablet TAKE ONE TABLET BY MOUTH ONCE DAILY. 07/23/15  Yes Laqueta Linden, MD  verapamil (CALAN-SR) 240 MG CR tablet TAKE (1) TABLET BY MOUTH EACH MORNING. 04/09/15  Yes Merlyn Albert, MD  XARELTO 20 MG TABS tablet TAKE 1 TABLET DAILY WITH SUPPER. 06/18/15  Yes Laqueta Linden, MD  zolpidem (AMBIEN) 10 MG tablet TAKE 1 TABLET BY MOUTH AT BEDTIME FOR SLEEP. 10/11/15  Yes Merlyn Albert, MD    hydrocortisone-pramoxine [facility]) rectal foam Place 1 applicator rectally 2 (two) times daily. Patient not taking: Reported on 11/29/2015 09/21/15   Campbell Riches, NP  metoCLOPramide (REGLAN) 5 MG tablet Take 1 tablet (5 mg total) by mouth 4 (four) times daily. Patient not taking: Reported on 11/29/2015 02/26/15   Blane Ohara, MD  predniSONE (DELTASONE) 20 MG tablet 3 today, then 2qd for 3d then 1qd for 3d Patient not taking: Reported on 12/06/2015 11/29/15   Babs Sciara, MD  PROCTOSOL HC 2.5 % rectal cream APPLY RECTALLY 3 TIMES DAILY. Patient not taking: Reported on 11/29/2015 08/14/15   Merlyn Albert, MD    Family History Family History  Problem Relation Age of Onset  . Alzheimer's disease    . Cancer    . Hypertension Mother   . Hypertension Father   . Anesthesia problems Neg Hx   . Hypotension Neg Hx   . Malignant hyperthermia Neg Hx   . Pseudochol deficiency Neg Hx     Social History Social History  Substance Use Topics  . Smoking status: Never Smoker  . Smokeless tobacco: Never Used  . Alcohol use No     Allergies   Codeine and Tramadol   Review of Systems Review of Systems  Unable to perform ROS: Mental status change  Cardiovascular: Positive for near-syncope.     Physical Exam Updated Vital Signs BP 142/87   Pulse (!) 53   Temp 98.2 F (36.8 C) (Oral)   Resp 12   Ht 5\' 2"  (1.575 m)   Wt 135 lb (61.2 kg)   SpO2 94%   BMI 24.69 kg/m   Physical Exam  1105: Physical examination:  Nursing notes reviewed; Vital signs and O2 SAT reviewed;  Constitutional: Well developed, Well nourished, Well hydrated, In no acute distress; Head:  Normocephalic, atraumatic; Eyes: EOMI, PERRL, No scleral icterus; ENMT: Mouth and pharynx normal, Mucous membranes moist; Neck: Supple, Full range of motion, No lymphadenopathy; Cardiovascular: Irregular rate and rhythm, No gallop; Respiratory: Breath sounds clear & equal bilaterally, No wheezes.  Speaking full  sentences with ease, Normal respiratory effort/excursion; Chest: Nontender, Movement normal; Abdomen: Soft, Nontender, Nondistended, Normal bowel sounds; Genitourinary: No CVA tenderness; Spine:  No midline CS, TS, LS tenderness.;; Extremities: Pulses normal, No tenderness, No edema, No calf edema or asymmetry.; Neuro: Awake, alert, confused re: time, place, events. Major CN grossly intact. Speech clear.  No facial droop.  No nystagmus. Grips equal. Strength 5/5 equal bilat UE's and LE's.  DTR 2/4 equal bilat UE's and LE's.  No gross sensory deficits.  Normal cerebellar testing bilat UE's (finger-nose) and LE's (heel-shin)..; Skin: Color normal, Warm, Dry.    ED Treatments / Results  Labs (all labs ordered are listed,  but only abnormal results are displayed)   EKG  EKG Interpretation  Date/Time:  Thursday December 06 2015 10:53:54 EDT Ventricular Rate:  92 PR Interval:    QRS Duration: 88 QT Interval:  417 QTC Calculation: 516 R Axis:   74 Text Interpretation:  Atrial fibrillation Nonspecific repol abnormality, diffuse leads Prolonged QT interval When compared with ECG of 10/24/2014 Atrial fibrillation has replaced Normal sinus rhythm and Nonspecific ST and T wave abnormality is now Present Confirmed by [facility] Center[facility]US  MD, Nicholos JohnsKATHLEEN [phone]([id number]) on 12/06/2015 11:39:42 AM       Radiology   Procedures Procedures (including critical care time)  Medications Ordered in ED Medications  0.9 %  sodium chloride infusion (not administered)  potassium chloride 10 mEq in 100 mL IVPB (not administered)  ondansetron (ZOFRAN) injection 4 mg (4 mg Intravenous Given by Other 12/06/15 1114)     Initial Impression / Assessment and Plan / ED Course  I have reviewed the triage vital signs and the nursing notes.  Pertinent labs & imaging results that were available during my care of the patient were reviewed by me and considered in my medical decision making (see chart for details).  MDM Reviewed: previous  chart, nursing note and vitals Reviewed previous: labs and ECG Interpretation: labs, ECG, x-ray and CT scan Total time providing critical care: 30-74 minutes. This excludes time spent performing separately reportable procedures and services. Consults: neurology and admitting MD   CRITICAL CARE Performed by: Laray AngerMCMANUS,[employer] M Total critical care time: 35 minutes Critical care time was exclusive of separately billable procedures and treating other patients. Critical care was necessary to treat or prevent imminent or life-threatening deterioration. Critical care was time spent personally by me on the following activities: development of treatment plan with patient and/or surrogate as well as nursing, discussions with consultants, evaluation of patient's response to treatment, examination of patient, obtaining history from patient or surrogate, ordering and performing treatments and interventions, ordering and review of laboratory studies, ordering and review of radiographic studies, pulse oximetry and re-evaluation of patient's condition.   Results for orders placed or performed during the hospital encounter of 12/06/15  Ethanol  Result Value Ref Range   Alcohol, Ethyl (B) 6 (H) <5 mg/dL  Urine rapid drug screen (hosp performed)not at [facility]  Result Value Ref Range   Opiates NONE DETECTED NONE DETECTED   Cocaine NONE DETECTED NONE DETECTED   Benzodiazepines NONE DETECTED NONE DETECTED   Amphetamines NONE DETECTED NONE DETECTED   Tetrahydrocannabinol NONE DETECTED NONE DETECTED   Barbiturates NONE DETECTED NONE DETECTED  Comprehensive metabolic panel  Result Value Ref Range   Sodium 126 (L) 135 - 145 mmol/L   Potassium 2.8 (L) 3.5 - 5.1 mmol/L   Chloride 91 (L) 101 - 111 mmol/L   CO2 25 22 - 32 mmol/L   Glucose, Bld 128 (H) 65 - 99 mg/dL   BUN 21 (H) 6 - 20 mg/dL   Creatinine, Ser 9.560.87 0.44 - 1.00 mg/dL   Calcium 9.0 8.9 - 38.710.3 mg/dL   Total Protein 6.6 6.5 - 8.1 g/dL   Albumin 4.1  3.5 - 5.0 g/dL   AST 22 15 - 41 U/L   ALT 20 14 - 54 U/L   Alkaline Phosphatase 56 38 - 126 U/L   Total Bilirubin 1.2 0.3 - 1.2 mg/dL   GFR calc non Af Amer >60 >60 mL/min   GFR calc Af Amer >60 >60 mL/min   Anion gap 10 5 - 15  CBC with  Differential  Result Value Ref Range   WBC 11.9 (H) 4.0 - 10.5 K/uL   RBC 4.89 3.87 - 5.11 MIL/uL   Hemoglobin 14.7 12.0 - 15.0 g/dL   HCT 40.9 81.1 - 91.4 %   MCV 87.1 78.0 - 100.0 fL   MCH 30.1 26.0 - 34.0 pg   MCHC 34.5 30.0 - 36.0 g/dL   RDW 78.2 95.6 - 21.3 %   Platelets 406 (H) 150 - 400 K/uL   Neutrophils Relative % 50 %   Neutro Abs 6.0 1.7 - 7.7 K/uL   Lymphocytes Relative 39 %   Lymphs Abs 4.7 (H) 0.7 - 4.0 K/uL   Monocytes Relative 10 %   Monocytes Absolute 1.2 (H) 0.1 - 1.0 K/uL   Eosinophils Relative 0 %   Eosinophils Absolute 0.0 0.0 - 0.7 K/uL   Basophils Relative 0 %   Basophils Absolute 0.0 0.0 - 0.1 K/uL  Urinalysis, Routine w reflex microscopic  Result Value Ref Range   Color, Urine YELLOW YELLOW   APPearance CLEAR CLEAR   Specific Gravity, Urine 1.020 1.005 - 1.030   pH 6.5 5.0 - 8.0   Glucose, UA NEGATIVE NEGATIVE mg/dL   Hgb urine dipstick NEGATIVE NEGATIVE   Bilirubin Urine NEGATIVE NEGATIVE   Ketones, ur NEGATIVE NEGATIVE mg/dL   Protein, ur NEGATIVE NEGATIVE mg/dL   Nitrite NEGATIVE NEGATIVE   Leukocytes, UA NEGATIVE NEGATIVE  Protime-INR  Result Value Ref Range   Prothrombin Time 14.1 11.4 - 15.2 seconds   INR 1.09   APTT  Result Value Ref Range   aPTT 22 (L) 24 - 36 seconds  Magnesium  Result Value Ref Range   Magnesium 2.1 1.7 - 2.4 mg/dL  I-stat troponin, ED (not at [facility])  Result Value Ref Range   Troponin i, poc 0.03 0.00 - 0.08 ng/mL   Comment 3          CBG monitoring, ED  Result Value Ref Range   Glucose-Capillary 121 (H) 65 - 99 mg/dL  I-Stat CG4 Lactic Acid, ED  Result Value Ref Range   Lactic Acid, Venous 2.66 (HH) 0.5 - 1.9 mmol/L   Ct Cervical Spine Wo Contrast Result Date:  12/06/2015 CLINICAL DATA:  82 year old female with syncope at 1015 hours. Subsequent confusion. Initial encounter. EXAM: CT CERVICAL SPINE WITHOUT CONTRAST TECHNIQUE: Multidetector CT imaging of the cervical spine was performed without intravenous contrast. Multiplanar CT image reconstructions were also generated. COMPARISON:  Head CT from the same day reported separately. FINDINGS: Alignment: Straightening of cervical lordosis. Mild retrolisthesis of C5 on C6. Cervicothoracic junction alignment is within normal limits. Bilateral posterior element alignment is within normal limits. Skull base and vertebrae: Osteopenia. Visualized skull base is intact. No atlanto-occipital dissociation. No cervical spine fracture identified. Soft tissues and spinal canal: No prevertebral fluid or swelling. No visible canal hematoma. Motion artifact at the hypopharynx. Negative noncontrast neck soft tissues. Disc levels: Lower cervical disc and endplate degeneration. No cervical spinal stenosis. Upper chest: Visible upper thoracic levels appear intact. Negative lung apices. IMPRESSION: 1.  No acute fracture or listhesis identified in the cervical spine. 2. CT head reported separately. Electronically Signed   By: Odessa Fleming M.D.   On: 12/06/2015 11:42   Ct Head Code Stroke W/o Cm Result Date: 12/06/2015 CLINICAL DATA:  Code stroke. 82 year old female with syncope at 1015 hours. Subsequent confusion. Initial encounter. EXAM: CT HEAD WITHOUT CONTRAST TECHNIQUE: Contiguous axial images were obtained from the base of the skull through the vertex  without intravenous contrast. COMPARISON:  Head CT without contrast 02/26/2015 FINDINGS: Brain: Patchy and widespread chronic white matter disease in both hemispheres appears stable. Temporal lobe involvement, especially the anterior left temporal lobe, appears severe, but not definitely different than on the prior study. No acute intracranial hemorrhage identified. No midline shift, mass effect,  or evidence of intracranial mass lesion. No ventriculomegaly. No cortically based acute infarct identified. Vascular: Calcified atherosclerosis at the skull base. No suspicious intracranial vascular hyperdensity. Skull: No acute osseous abnormality identified. Sinuses/Orbits: Visualized paranasal sinuses and mastoids are stable and well pneumatized. Other: No acute orbit or scalp soft tissue findings. ASPECTS [facility] Stroke Program Early CT Score) - Ganglionic level infarction (caudate, lentiform nuclei, internal capsule, insula, M1-M3 cortex): 7 - Supraganglionic infarction (M4-M6 cortex): 3 Total score (0-10 with 10 being normal): 10 IMPRESSION: 1. No acute cortically based infarct or acute intracranial hemorrhage identified. 2. Advanced chronic white matter disease not definitely progressed since January. Left greater than right temporal lobe involvement noted. 3. ASPECTS is 10. 4. Study discussed by telephone with Dr. Samuel Jester on 12/06/2015 at 11:38 . Electronically Signed   By: Odessa Fleming M.D.   On: 12/06/2015 11:39     1140:  [facility] Neurology has evaluated pt: not TPA candidate due to xarelto and deficit is mild; admit for TIA workup.  1415:  New hyponatremia on labs. Judicious IVF given. Potassium repleted IV.  Pt no longer confused and is acting per baseline per family at bedside. Dx and testing d/w pt and family.  Questions answered.  Verb understanding, agreeable to admit.  T/C to [facility] Dr. Kerry Hough, case discussed, including:  HPI, pertinent PM/SHx, VS/PE, dx testing, ED course and treatment:  Agreeable to admit, requests to order MRI/MRA, write temporary orders, obtain observation tele bed to team [facility]mits.   Final Clinical Impressions(s) / ED Diagnoses   Final diagnoses:  None    New Prescriptions New Prescriptions   No medications on file     Samuel Jester, DO 12/09/15 1707

## 2015-12-06 NOTE — ED Notes (Signed)
Teleneuro at this time. Prior to exam, Teleneuro cart had connectivity issues.

## 2015-12-06 NOTE — Telephone Encounter (Signed)
Discussed with ED physician. She is going to speak with internal medicine regarding patient's altered mental status.

## 2015-12-06 NOTE — ED Notes (Signed)
Patient transported to MRI 

## 2015-12-06 NOTE — ED Notes (Addendum)
Pt in CT, unable to perform full NIH.  Pt able to move all extremities, moderate anxiety and confusion noted while in CT.

## 2015-12-06 NOTE — Progress Notes (Signed)
No phone call 11;04 am beeper 11:13 exam started 11:22 exam finished. (c-spine added to code stroke) 11:22 [facility]28 exam [facility] Epic and [facility] spoke with [place].  Please note increase in exam time due to addition of  C-spine protocol/bbj

## 2015-12-06 NOTE — ED Notes (Signed)
Pt taken to CT.

## 2015-12-06 NOTE — ED Notes (Signed)
Moderate confusion and pt movement while bp being obtained.

## 2015-12-06 NOTE — Telephone Encounter (Signed)
Patient would like to speak w/ Dr.Koneswaran in regards to patient being in [facility] and is being admitted to hospital. Patient's daughter wants her to be seen by Cardiology. / tg

## 2015-12-07 DIAGNOSIS — I480 Paroxysmal atrial fibrillation: Secondary | ICD-10-CM | POA: Diagnosis not present

## 2015-12-07 DIAGNOSIS — E871 Hypo-osmolality and hyponatremia: Secondary | ICD-10-CM | POA: Diagnosis not present

## 2015-12-07 DIAGNOSIS — E860 Dehydration: Secondary | ICD-10-CM | POA: Diagnosis not present

## 2015-12-07 DIAGNOSIS — I10 Essential (primary) hypertension: Secondary | ICD-10-CM | POA: Diagnosis not present

## 2015-12-07 LAB — BASIC METABOLIC PANEL
Anion gap: 5 (ref 5–15)
BUN: 14 mg/dL (ref 6–20)
CO2: 22 mmol/L (ref 22–32)
Calcium: 8.1 mg/dL — ABNORMAL LOW (ref 8.9–10.3)
Chloride: 102 mmol/L (ref 101–111)
Creatinine, Ser: 0.68 mg/dL (ref 0.44–1.00)
GFR calc Af Amer: >60 mL/min (ref >60)
GFR calc non Af Amer: >60 mL/min (ref >60)
Glucose, Bld: 89 mg/dL (ref 65–99)
Potassium: 5.2 mmol/L — ABNORMAL HIGH (ref 3.5–5.1)
Sodium: 129 mmol/L — ABNORMAL LOW (ref 135–145)

## 2015-12-07 LAB — CBC
HCT: 37.7 % (ref 36.0–46.0)
Hemoglobin: 12.7 g/dL (ref 12.0–15.0)
MCH: 30.1 pg (ref 26.0–34.0)
MCHC: 33.7 g/dL (ref 30.0–36.0)
MCV: 89.3 fL (ref 78.0–100.0)
Platelets: 345 10*3/uL (ref 150–400)
RBC: 4.22 MIL/uL (ref 3.87–5.11)
RDW: 14.2 % (ref 11.5–15.5)
WBC: 9.7 10*3/uL (ref 4.0–10.5)

## 2015-12-07 LAB — TROPONIN I
Troponin I: 0.03 ng/mL (ref <0.03)
Troponin I: 0.03 ng/mL (ref <0.03)

## 2015-12-07 MED ORDER — SODIUM CHLORIDE 0.9 % IV SOLN
INTRAVENOUS | Status: DC
  Administered 2015-12-07: 10:00:00 via INTRAVENOUS

## 2015-12-07 NOTE — Progress Notes (Signed)
PT Cancellation Note  Patient Details Name: Katherine RicksBetty M Lane MRN: [id number] DOB: 09/29/1932   Cancelled Treatment:    Reason Eval/Treat Not Completed: PT screened, no needs identified, will sign off. Pt presenting at baseline, able to perform 500+ft AMB at modified-independent level, manage 9 stairs with single railing on Right. Pt denies any acute impairment of strength, balance, or mobility. HR with activity in afib from 105-115bpm, pt denies SOB. No history of falls in last 3 months. Ample familial support, family in room corroborating patient is much better. No additional PT services at this point; PT signing off. Recommend retrn to home with assistance as needed. No DME needs at this time. Recommend FU with OPPT for chronic issue of LBP as previously laid out PTA.    4:14 PM, 12/07/15 Rosamaria LintsAllan C Landra Howze, PT, DPT Physical Therapist at [facility] [phone] (office)

## 2015-12-07 NOTE — Care Management Note (Signed)
Case Management Note  Patient Details  Name: Katherine RicksBetty M Lane MRN: [id number] Date of Birth: 23-Dec-1932  Subjective/Objective:                  Pt admitted with AMS. She is from home, lives alone and has family for support, daughter is at the bedside today. Pt is ind with ADL's. She has cane, walker, wheelchair and BSC but does not have a need to use them. Pt has PCP, drives herself to appointments, has insurance with drug coverage and no difficulty affording her medications. She manages her own medications. She plans to return home with self care at DC.   Action/Plan: No CM needs anticipated.   Expected Discharge Date:     12/07/2015             Expected Discharge Plan:  Home/Self Care  In-House Referral:  NA  Discharge planning Services  CM Consult  Post Acute Care Choice:  NA Choice offered to:  NA  Status of Service:  Completed, signed off   Katherine Lane, Katherine Merkel Demske, RN 12/07/2015, 1:30 PM

## 2015-12-07 NOTE — Consult Note (Signed)
Bajandas A. Merlene Laughter, MD     [email or web address]          Katherine Lane is an 82 y.o. female.   ASSESSMENT/PLAN: 1. Multiple symptoms including presyncope, confusion, global weakness and gait impairment in the setting of metabolic derangements the most causative been marked hypokalemia. This is the most likely etiology.  Physical examination and history along with imaging does not suggest ischemia/stroke or com partial seizure. Agree with the current workup and treatment.    Patient 82 year old white female who developed left sided headaches over a week ago. It appears the V1 distribution was symptomatic. She apparently was diagnosed with neuralgia and started on steroids. It appears she was given the Medrol Dosepak. The patient noticed that her blood pressure was elevated and she never taken more of her medications for this. Couple days ago she became progressively weak. She decided see medical attention when she was noted by her daughter to be quite weak with visual obscuration/gr of her vision and confusion as noticed by her daughter. She does not remember losing consciousness although the daughter reports that she may have briefly. No focal neurological symptoms are reported as the weakness, numbness, dysarthria or dysphagia. No diplopia is reported. No chest pain is reported. The daughter reports that once the patient came to the hospital was given IV fluids and other treatments her symptoms improved significantly. She has continued to improve and appears to be at baseline. She typically lives by herself and drives and functions fairly well. The review systems otherwise negative.    GENERAL: This a pleasant female in no acute distress.  HEENT: Normal  ABDOMEN: soft  EXTREMITIES: No edema. Significant arthritic changes of the extremities.   BACK: Normal  SKIN: Normal by inspection.    MENTAL STATUS: Alert and oriented. Speech, language and cognition are  generally intact. Judgment and insight normal.   CRANIAL NERVES: Pupils are equal, round and reactive to light and accomodation; extra ocular movements are full, there is no significant nystagmus; visual fields are full; upper and lower facial muscles are normal in strength and symmetric, there is no flattening of the nasolabial folds; tongue is midline; uvula is midline; shoulder elevation is normal.  MOTOR: Normal tone, bulk and strength; no pronator drift.  COORDINATION: Left finger to nose is normal, right finger to nose is normal, No rest tremor; no intention tremor; no postural tremor; no bradykinesia.  REFLEXES: Deep tendon reflexes are symmetrical and normal.   SENSATION: Normal to light touch.      Blood pressure 127/68, pulse 74, temperature 98.5 F (36.9 C), temperature source Oral, resp. rate 18, height '5\' 2"'  (1.575 m), weight 136 lb 12.8 oz (62.1 kg), SpO2 99 %.  Past Medical History:  Diagnosis Date  . A-fib ([place])   . Anxiety   . Arthritis   . Chronic back pain   . Constipation, chronic   . Depression   . HTN (hypertension)   . Insomnia   . Migraine headache   . Osteoporosis   . PONV (postoperative nausea and vomiting)   . Trigeminal neuralgia     Past Surgical History:  Procedure Laterality Date  . ANKLE FRACTURE SURGERY     2010-right  . CARDIAC CATHETERIZATION     denies  . CATARACT EXTRACTION W/PHACO Right 07/22/2012   Procedure: CATARACT EXTRACTION PHACO AND INTRAOCULAR LENS PLACEMENT (IOC);  Surgeon: Tonny Branch, MD;  Location: [facility];  Service: Ophthalmology;  Laterality: Right;  CDE:  18.93  .  CATARACT EXTRACTION W/PHACO Left 08/09/2012   Procedure: CATARACT EXTRACTION PHACO AND INTRAOCULAR LENS PLACEMENT (IOC);  Surgeon: Tonny Branch, MD;  Location: [facility];  Service: Ophthalmology;  Laterality: Left;  CDE: 17.21  . COLONOSCOPY    . HARDWARE REMOVAL  07/18/2011   Procedure: HARDWARE REMOVAL;  Surgeon: Carole Civil, MD;  Location: [facility];  Service:  Orthopedics;  Laterality: Right;  . NERVE REPAIR  07/18/2011   Procedure: NERVE REPAIR;  Surgeon: Carole Civil, MD;  Location: [facility];  Service: Orthopedics;  Laterality: Right;  Right leg superficial peroneal nerve release    . PARTIAL HYSTERECTOMY      Family History  Problem Relation Age of Onset  . Alzheimer's disease    . Cancer    . Hypertension Mother   . Hypertension Father   . Anesthesia problems Neg Hx   . Hypotension Neg Hx   . Malignant hyperthermia Neg Hx   . Pseudochol deficiency Neg Hx     Social History:  reports that she has never smoked. She has never used smokeless tobacco. She reports that she does not drink alcohol or use drugs.  Allergies:  Allergies  Allergen Reactions  . Codeine Nausea And Vomiting    Patient states intolerance to all pain medications  . Tramadol     Medications: Prior to Admission medications   Medication Sig Start Date End Date Taking? Authorizing Provider  enalapril (VASOTEC) 20 MG tablet TAKE (1) TABLET BY MOUTH TWICE DAILY. 03/26/15  Yes Herminio Commons, MD  LORazepam (ATIVAN) 0.5 MG tablet TAKE (1) TABLET DAILY AS NEEDED. 11/07/15  Yes Mikey Kirschner, MD  metoprolol succinate (TOPROL-XL) 25 MG 24 hr tablet TAKE ONE TABLET BY MOUTH ONCE DAILY. 07/23/15  Yes Herminio Commons, MD  verapamil (CALAN-SR) 240 MG CR tablet TAKE (1) TABLET BY MOUTH EACH MORNING. 04/09/15  Yes Mikey Kirschner, MD  XARELTO 20 MG TABS tablet TAKE 1 TABLET DAILY WITH SUPPER. 06/18/15  Yes Herminio Commons, MD  zolpidem (AMBIEN) 10 MG tablet TAKE 1 TABLET BY MOUTH AT BEDTIME FOR SLEEP. 10/11/15  Yes Mikey Kirschner, MD    Scheduled Meds: . enalapril  20 mg Oral BID  . metoprolol succinate  25 mg Oral Daily  . rivaroxaban  20 mg Oral Q supper  . verapamil  240 mg Oral Daily  . zolpidem  5 mg Oral QHS   Continuous Infusions: . sodium chloride 100 mL/hr at 12/07/15 1028   PRN Meds:.acetaminophen **OR** acetaminophen, LORazepam,  prochlorperazine     Results for orders placed or performed during the hospital encounter of 12/06/15 (from the past 48 hour(s))  Urine rapid drug screen (hosp performed)not at [facility]     Status: None   Collection Time: 12/06/15 11:04 AM  Result Value Ref Range   Opiates NONE DETECTED NONE DETECTED   Cocaine NONE DETECTED NONE DETECTED   Benzodiazepines NONE DETECTED NONE DETECTED   Amphetamines NONE DETECTED NONE DETECTED   Tetrahydrocannabinol NONE DETECTED NONE DETECTED   Barbiturates NONE DETECTED NONE DETECTED    Comment:        DRUG SCREEN FOR MEDICAL PURPOSES ONLY.  IF CONFIRMATION IS NEEDED FOR ANY PURPOSE, NOTIFY LAB WITHIN 5 DAYS.        LOWEST DETECTABLE LIMITS FOR URINE DRUG SCREEN Drug Class       Cutoff (ng/mL) Amphetamine      1000 Barbiturate      200 Benzodiazepine   256 Tricyclics  300 Opiates          300 Cocaine          300 THC              50   Ethanol     Status: Abnormal   Collection Time: 12/06/15 11:05 AM  Result Value Ref Range   Alcohol, Ethyl (B) 6 (H) <5 mg/dL    Comment:        LOWEST DETECTABLE LIMIT FOR SERUM ALCOHOL IS 5 mg/dL FOR MEDICAL PURPOSES ONLY   CBG monitoring, ED     Status: Abnormal   Collection Time: 12/06/15 11:06 AM  Result Value Ref Range   Glucose-Capillary 121 (H) 65 - 99 mg/dL  I-stat troponin, ED (not at [facility], [facility])     Status: None   Collection Time: 12/06/15 11:07 AM  Result Value Ref Range   Troponin i, poc 0.03 0.00 - 0.08 ng/mL   Comment 3            Comment: Due to the release kinetics of cTnI, a negative result within the first hours of the onset of symptoms does not rule out myocardial infarction with certainty. If myocardial infarction is still suspected, repeat the test at appropriate intervals.   Comprehensive metabolic panel     Status: Abnormal   Collection Time: 12/06/15 11:09 AM  Result Value Ref Range   Sodium 126 (L) 135 - 145 mmol/L   Potassium 2.8 (L) 3.5 - 5.1 mmol/L   Chloride 91  (L) 101 - 111 mmol/L   CO2 25 22 - 32 mmol/L   Glucose, Bld 128 (H) 65 - 99 mg/dL   BUN 21 (H) 6 - 20 mg/dL   Creatinine, Ser 0.87 0.44 - 1.00 mg/dL   Calcium 9.0 8.9 - 10.3 mg/dL   Total Protein 6.6 6.5 - 8.1 g/dL   Albumin 4.1 3.5 - 5.0 g/dL   AST 22 15 - 41 U/L   ALT 20 14 - 54 U/L   Alkaline Phosphatase 56 38 - 126 U/L   Total Bilirubin 1.2 0.3 - 1.2 mg/dL   GFR calc non Af Amer >60 >60 mL/min   GFR calc Af Amer >60 >60 mL/min    Comment: (NOTE) The eGFR has been calculated using the CKD EPI equation. This calculation has not been validated in all clinical situations. eGFR's persistently <60 mL/min signify possible Chronic Kidney Disease.    Anion gap 10 5 - 15  CBC with Differential     Status: Abnormal   Collection Time: 12/06/15 11:09 AM  Result Value Ref Range   WBC 11.9 (H) 4.0 - 10.5 K/uL   RBC 4.89 3.87 - 5.11 MIL/uL   Hemoglobin 14.7 12.0 - 15.0 g/dL   HCT 42.6 36.0 - 46.0 %   MCV 87.1 78.0 - 100.0 fL   MCH 30.1 26.0 - 34.0 pg   MCHC 34.5 30.0 - 36.0 g/dL   RDW 13.7 11.5 - 15.5 %   Platelets 406 (H) 150 - 400 K/uL   Neutrophils Relative % 50 %   Neutro Abs 6.0 1.7 - 7.7 K/uL   Lymphocytes Relative 39 %   Lymphs Abs 4.7 (H) 0.7 - 4.0 K/uL   Monocytes Relative 10 %   Monocytes Absolute 1.2 (H) 0.1 - 1.0 K/uL   Eosinophils Relative 0 %   Eosinophils Absolute 0.0 0.0 - 0.7 K/uL   Basophils Relative 0 %   Basophils Absolute 0.0 0.0 - 0.1 K/uL  Protime-INR  Status: None   Collection Time: 12/06/15 11:09 AM  Result Value Ref Range   Prothrombin Time 14.1 11.4 - 15.2 seconds   INR 1.09   APTT     Status: Abnormal   Collection Time: 12/06/15 11:09 AM  Result Value Ref Range   aPTT 22 (L) 24 - 36 seconds  I-Stat CG4 Lactic Acid, ED     Status: Abnormal   Collection Time: 12/06/15 11:10 AM  Result Value Ref Range   Lactic Acid, Venous 2.66 (HH) 0.5 - 1.9 mmol/L  Urinalysis, Routine w reflex microscopic     Status: None   Collection Time: 12/06/15 11:17 AM   Result Value Ref Range   Color, Urine YELLOW YELLOW   APPearance CLEAR CLEAR   Specific Gravity, Urine 1.020 1.005 - 1.030   pH 6.5 5.0 - 8.0   Glucose, UA NEGATIVE NEGATIVE mg/dL   Hgb urine dipstick NEGATIVE NEGATIVE   Bilirubin Urine NEGATIVE NEGATIVE   Ketones, ur NEGATIVE NEGATIVE mg/dL   Protein, ur NEGATIVE NEGATIVE mg/dL   Nitrite NEGATIVE NEGATIVE   Leukocytes, UA NEGATIVE NEGATIVE    Comment: MICROSCOPIC NOT DONE ON URINES WITH NEGATIVE PROTEIN, BLOOD, LEUKOCYTES, NITRITE, OR GLUCOSE <1000 mg/dL.  Magnesium     Status: None   Collection Time: 12/06/15 11:41 AM  Result Value Ref Range   Magnesium 2.1 1.7 - 2.4 mg/dL  TSH     Status: None   Collection Time: 12/06/15 11:41 AM  Result Value Ref Range   TSH 1.845 0.350 - 4.500 uIU/mL    Comment: Performed by a 3rd Generation assay with a functional sensitivity of <=0.01 uIU/mL.  Troponin I     Status: Abnormal   Collection Time: 12/06/15  6:20 PM  Result Value Ref Range   Troponin I 0.03 (HH) <0.03 ng/mL    Comment: CRITICAL RESULT CALLED TO, READ BACK BY AND VERIFIED WITH: HAMLTON,S AT 1850 ON 12/06/2015 BY ISLEY,B   Troponin I     Status: Abnormal   Collection Time: 12/06/15 11:29 PM  Result Value Ref Range   Troponin I 0.03 (HH) <0.03 ng/mL    Comment: CRITICAL VALUE NOTED.  VALUE IS CONSISTENT WITH PREVIOUSLY REPORTED AND CALLED VALUE.  Basic metabolic panel     Status: Abnormal   Collection Time: 12/07/15  5:28 AM  Result Value Ref Range   Sodium 129 (L) 135 - 145 mmol/L   Potassium 5.2 (H) 3.5 - 5.1 mmol/L    Comment: DELTA CHECK NOTED   Chloride 102 101 - 111 mmol/L   CO2 22 22 - 32 mmol/L   Glucose, Bld 89 65 - 99 mg/dL   BUN 14 6 - 20 mg/dL   Creatinine, Ser 0.68 0.44 - 1.00 mg/dL   Calcium 8.1 (L) 8.9 - 10.3 mg/dL   GFR calc non Af Amer >60 >60 mL/min   GFR calc Af Amer >60 >60 mL/min    Comment: (NOTE) The eGFR has been calculated using the CKD EPI equation. This calculation has not been validated  in all clinical situations. eGFR's persistently <60 mL/min signify possible Chronic Kidney Disease.    Anion gap 5 5 - 15  CBC     Status: None   Collection Time: 12/07/15  5:28 AM  Result Value Ref Range   WBC 9.7 4.0 - 10.5 K/uL   RBC 4.22 3.87 - 5.11 MIL/uL   Hemoglobin 12.7 12.0 - 15.0 g/dL   HCT 37.7 36.0 - 46.0 %   MCV 89.3 78.0 -  100.0 fL   MCH 30.1 26.0 - 34.0 pg   MCHC 33.7 30.0 - 36.0 g/dL   RDW 14.2 11.5 - 15.5 %   Platelets 345 150 - 400 K/uL  Troponin I     Status: Abnormal   Collection Time: 12/07/15  5:28 AM  Result Value Ref Range   Troponin I 0.03 (HH) <0.03 ng/mL    Comment: CRITICAL VALUE NOTED.  VALUE IS CONSISTENT WITH PREVIOUSLY REPORTED AND CALLED VALUE.    Studies/Results:   BRAIN MRI/MRA CLINICAL DATA:  Syncopal episode in bathroom, transient blindness. Confusion. Concurrent severe hypertension. History of migraine headaches and trigeminal neuralgia, atrial fibrillation.  EXAM: MRI HEAD WITHOUT CONTRAST  MRA HEAD WITHOUT CONTRAST  TECHNIQUE: Multiplanar, multiecho pulse sequences of the brain and surrounding structures were obtained without intravenous contrast. Angiographic images of the head were obtained using MRA technique without contrast.  COMPARISON:  CT HEAD December 06, 2015 at 1114 hours  FINDINGS: MRI HEAD FINDINGS  BRAIN: No reduced diffusion to suggest acute ischemia. No susceptibility artifact to suggest hemorrhage. The ventricles and sulci are normal for patient's age. Patchy to confluent supratentorial white matter FLAIR T2 hyperintensities including disproportion subcortical white matter involvement. No suspicious parenchymal signal, masses or mass effect. No abnormal extra-axial fluid collections. No extra-axial masses though, contrast enhanced sequences would be more sensitive.  VASCULAR: Normal major intracranial vascular flow voids present at skull base.  SKULL AND UPPER CERVICAL SPINE: No abnormal sellar  expansion. No suspicious calvarial bone marrow signal. Craniocervical junction maintained.  SINUSES/ORBITS: The mastoid air-cells and included paranasal sinuses are well-aerated. Status post bilateral ocular lens implants. The included ocular globes and orbital contents are non-suspicious.  OTHER: None.  MRA HEAD FINDINGS- signal loss favored to reflect patient motion.  ANTERIOR CIRCULATION: Flow related enhancement of the included cervical, petrous, cavernous and supraclinoid internal carotid arteries. Narrowed LEFT cavernous internal carotid artery flow void favoring artifact. Patent anterior communicating artery. Normal flow related enhancement of the anterior and middle cerebral arteries, including distal segments.  No large vessel occlusion, high-grade stenosis, abnormal luminal irregularity, aneurysm.  POSTERIOR CIRCULATION: Codominant vertebral artery's. Basilar artery is patent, with normal flow related enhancement of the main branch vessels. Normal flow related enhancement of the posterior cerebral arteries. Mild stenosis RIGHT P1 2 junction versus flow artifact from tortuosity.  No large vessel occlusion, high-grade stenosis, abnormal luminal irregularity, aneurysm.  IMPRESSION: MRI HEAD: No acute intracranial process.  Moderate to severe white matter changes most compatible with chronic small vessel ischemic disease. Considering distribution of white matter changes, superimposed chronic changes from trauma, old infection or vasculopathy are possible.  MRA HEAD: No emergent large vessel occlusion or severe stenosis.               Kelechi Orgeron A. Merlene Laughter, M.D.  Diplomate, Tax adviser of Psychiatry and Neurology ( Neurology). 12/07/2015, 5:04 PM

## 2015-12-07 NOTE — Care Management Obs Status (Signed)
MEDICARE OBSERVATION STATUS NOTIFICATION   Patient Details  Name: Katherine Lane MRN: [id number] Date of Birth: May 15, 1932   Medicare Observation Status Notification Given:  Yes    Malcolm MetroChildress, Mistina Coatney Demske, RN 12/07/2015, 1:30 PM

## 2015-12-07 NOTE — Progress Notes (Signed)
Pt's IV catheter removed and intact. Pt's IV site clean dry and intact. Discharge instructions including medications were reviewed and discussed with patient and her daughter. Pt and her daughter verbalized understanding of discharge instructions including medications. All questions were answered and no further questions at this time. Pt in stable condition and in no acute distress at time of discharge. Pt escorted by RN.

## 2015-12-07 NOTE — Discharge Summary (Signed)
Physician Discharge Summary  Katherine Lane NFA:[id number] DOB: 02-08-1933 DOA: 12/06/2015  PCP: Lubertha South, MD  Admit date: 12/06/2015 Discharge date: 12/07/2015  Admitted From: home Disposition:  home  Recommendations for Outpatient Follow-up:  1. Follow up with PCP in 1-2 weeks 2. Please obtain BMP/CBC in one week   Home Health: Equipment/Devices:  Discharge Condition: stable CODE STATUS: full Diet recommendation: Heart Healthy  Brief/Interim Summary: Katherine Lane is a 82 y.o. female with medical history significant of hypertension, atrial fibrillation on anticoagulation, was recently diagnosed with trigeminal neuralgia by her primary care physician and treated with a course of prednisone. Patient reports that the prednisone had significantly improved her neuralgia, but was causing her blood pressure to be elevated. Over the past week, she's been using hydrochlorothiazide to help manage her blood pressure. She reports having intermittent "weak spells" over the past few days. This morning she reported to feel quite well when she spoke to her daughter over the phone at 8:00am. She had gone into the shower and all of a sudden became extremely weak. She felt that her vision was starting to become dark. She did not completely lose her vision, post having a difficult time seeing. She laid on the floor and the bathroom and eventually crawled back to her bed. She called her daughter who check on her within 10 minutes. Upon the daughter's arrival, she told her daughter that her vision was starting to improve. She began developing nausea. She was brought to the ER for evaluation where she began to have episodes of vomiting. This was followed by transient confusion where she did not recognize family members. She denies any numbness or unilateral weakness. Her family did not note any slurring of her speech. After coming to the emergency room, confusion spontaneously improved and she is back to her  baseline. Vision is also improved. She's been referred for further workup.  Discharge Diagnoses:  Active Problems:   Essential hypertension   Atrial fibrillation (HCC)   Altered mental status   Hyponatremia   Hypokalemia   Dehydration   Prolonged Q-T interval on ECG   Postural dizziness with presyncope  1. Transient confusion with transient decrease in vision. MRI did not show any evidence of stroke. She did have evidence of volume depletion with hyponatremia and elevated lactic acid. Possibly related to dehydration. Symptoms may have been related to vagal response. She did not have any obvious source of infection. Could also be related to electrolyte abnormalities and prolonged QT. Patient received IV fluids and electrolytes improved. She felt significantly better. She was seen by neurology who did not recommend any further work up. She is stable for discharge home  2. Hyponatremia. Likely related to hydrochlorothiazide. Improving with saline. HCTZ discontinued. Needs repeat BMET in 1 week.  3. Hypokalemia. Likely related to hydrochlorothiazide. replaced  4. Paroxysmal atrial fibrillation. Continue on calcium channel blockers. She is anticoagulated with Xarelto .  5. Hypertension. Continue home regimen.  6. Recent trigeminal neuralgia. Improved with steroids. She has completed her course of prednisone.  Discharge Instructions  Discharge Instructions    Diet - low sodium heart healthy    Complete by:  As directed    Increase activity slowly    Complete by:  As directed        Medication List    TAKE these medications   enalapril 20 MG tablet Commonly known as:  VASOTEC TAKE (1) TABLET BY MOUTH TWICE DAILY.   LORazepam 0.5 MG tablet Commonly known as:  ATIVAN TAKE (1) TABLET DAILY AS NEEDED.   metoprolol succinate 25 MG 24 hr tablet Commonly known as:  TOPROL-XL TAKE ONE TABLET BY MOUTH ONCE DAILY.   verapamil 240 MG CR tablet Commonly known as:  CALAN-SR TAKE  (1) TABLET BY MOUTH EACH MORNING.   XARELTO 20 MG Tabs tablet Generic drug:  rivaroxaban TAKE 1 TABLET DAILY WITH SUPPER.   zolpidem 10 MG tablet Commonly known as:  AMBIEN TAKE 1 TABLET BY MOUTH AT BEDTIME FOR SLEEP.       Allergies  Allergen Reactions  . Codeine Nausea And Vomiting    Patient states intolerance to all pain medications  . Tramadol     Consultations:  Neurology   Procedures/Studies: Ct Cervical Spine Wo Contrast  Result Date: 12/06/2015 CLINICAL DATA:  82 year old female with syncope at 1015 hours. Subsequent confusion. Initial encounter. EXAM: CT CERVICAL SPINE WITHOUT CONTRAST TECHNIQUE: Multidetector CT imaging of the cervical spine was performed without intravenous contrast. Multiplanar CT image reconstructions were also generated. COMPARISON:  Head CT from the same day reported separately. FINDINGS: Alignment: Straightening of cervical lordosis. Mild retrolisthesis of C5 on C6. Cervicothoracic junction alignment is within normal limits. Bilateral posterior element alignment is within normal limits. Skull base and vertebrae: Osteopenia. Visualized skull base is intact. No atlanto-occipital dissociation. No cervical spine fracture identified. Soft tissues and spinal canal: No prevertebral fluid or swelling. No visible canal hematoma. Motion artifact at the hypopharynx. Negative noncontrast neck soft tissues. Disc levels: Lower cervical disc and endplate degeneration. No cervical spinal stenosis. Upper chest: Visible upper thoracic levels appear intact. Negative lung apices. IMPRESSION: 1.  No acute fracture or listhesis identified in the cervical spine. 2. CT head reported separately. Electronically Signed   By: Odessa Fleming M.D.   On: 12/06/2015 11:42   Mr Brain Wo Contrast (neuro Protocol)  Result Date: 12/06/2015 CLINICAL DATA:  Syncopal episode in bathroom, transient blindness. Confusion. Concurrent severe hypertension. History of migraine headaches and trigeminal  neuralgia, atrial fibrillation. EXAM: MRI HEAD WITHOUT CONTRAST MRA HEAD WITHOUT CONTRAST TECHNIQUE: Multiplanar, multiecho pulse sequences of the brain and surrounding structures were obtained without intravenous contrast. Angiographic images of the head were obtained using MRA technique without contrast. COMPARISON:  CT HEAD December 06, 2015 at 1114 hours FINDINGS: MRI HEAD FINDINGS BRAIN: No reduced diffusion to suggest acute ischemia. No susceptibility artifact to suggest hemorrhage. The ventricles and sulci are normal for patient's age. Patchy to confluent supratentorial white matter FLAIR T2 hyperintensities including disproportion subcortical white matter involvement. No suspicious parenchymal signal, masses or mass effect. No abnormal extra-axial fluid collections. No extra-axial masses though, contrast enhanced sequences would be more sensitive. VASCULAR: Normal major intracranial vascular flow voids present at skull base. SKULL AND UPPER CERVICAL SPINE: No abnormal sellar expansion. No suspicious calvarial bone marrow signal. Craniocervical junction maintained. SINUSES/ORBITS: The mastoid air-cells and included paranasal sinuses are well-aerated. Status post bilateral ocular lens implants. The included ocular globes and orbital contents are non-suspicious. OTHER: None. MRA HEAD FINDINGS- signal loss favored to reflect patient motion. ANTERIOR CIRCULATION: Flow related enhancement of the included cervical, petrous, cavernous and supraclinoid internal carotid arteries. Narrowed LEFT cavernous internal carotid artery flow void favoring artifact. Patent anterior communicating artery. Normal flow related enhancement of the anterior and middle cerebral arteries, including distal segments. No large vessel occlusion, high-grade stenosis, abnormal luminal irregularity, aneurysm. POSTERIOR CIRCULATION: Codominant vertebral artery's. Basilar artery is patent, with normal flow related enhancement of the main branch  vessels. Normal flow related enhancement of  the posterior cerebral arteries. Mild stenosis RIGHT P1 2 junction versus flow artifact from tortuosity. No large vessel occlusion, high-grade stenosis, abnormal luminal irregularity, aneurysm. IMPRESSION: MRI HEAD: No acute intracranial process. Moderate to severe white matter changes most compatible with chronic small vessel ischemic disease. Considering distribution of white matter changes, superimposed chronic changes from trauma, old infection or vasculopathy are possible. MRA HEAD: No emergent large vessel occlusion or severe stenosis. Electronically Signed   By: Awilda Metro M.D.   On: 12/06/2015 15:03   Mr Lumbar Spine Wo Contrast  Result Date: 11/26/2015 CLINICAL DATA:  Chronic low back pain radiating to the right leg EXAM: MRI LUMBAR SPINE WITHOUT CONTRAST TECHNIQUE: Multiplanar, multisequence MR imaging of the lumbar spine was performed. No intravenous contrast was administered. COMPARISON:  04/26/2013 FINDINGS: Segmentation:  Standard. Alignment:  Physiologic. Vertebrae: No acute fracture, evidence of discitis, or bone lesion. Chronic T12 vertebral body compression fracture. Conus medullaris: Extends to the L1 level and appears normal. Paraspinal and other soft tissues: No paraspinal soft tissue abnormality. Disc levels: Disc spaces: Degenerative disc disease with disc height loss at L3-4. Disc desiccation throughout the lumbar spine. T12-L1: No significant disc bulge. No evidence of neural foraminal stenosis. No central canal stenosis. L1-L2: Minimal broad-based disc bulge. No evidence of neural foraminal stenosis. No central canal stenosis. L2-L3: Mild broad-based disc bulge. No evidence of neural foraminal stenosis. No central canal stenosis. L3-L4: Mild broad-based disc bulge. Mild bilateral facet arthropathy. Left lateral recess stenosis. Mild spinal stenosis. No foraminal stenosis. L4-L5: Broad-based disc bulge. Mild bilateral facet arthropathy  and bilateral lateral recess stenosis. No evidence of neural foraminal stenosis. No central canal stenosis. L5-S1: Mild broad-based disc bulge. Moderate right and mild left facet arthropathy. No evidence of neural foraminal stenosis. No central canal stenosis. IMPRESSION: 1. At L3-4 there is a mild broad-based disc bulge. Mild bilateral facet arthropathy. Left lateral recess stenosis. Mild spinal stenosis. Electronically Signed   By: Elige Ko   On: 11/26/2015 12:43   Mr Maxine Glenn Head (cerebral Arteries)  Result Date: 12/06/2015 CLINICAL DATA:  Syncopal episode in bathroom, transient blindness. Confusion. Concurrent severe hypertension. History of migraine headaches and trigeminal neuralgia, atrial fibrillation. EXAM: MRI HEAD WITHOUT CONTRAST MRA HEAD WITHOUT CONTRAST TECHNIQUE: Multiplanar, multiecho pulse sequences of the brain and surrounding structures were obtained without intravenous contrast. Angiographic images of the head were obtained using MRA technique without contrast. COMPARISON:  CT HEAD December 06, 2015 at 1114 hours FINDINGS: MRI HEAD FINDINGS BRAIN: No reduced diffusion to suggest acute ischemia. No susceptibility artifact to suggest hemorrhage. The ventricles and sulci are normal for patient's age. Patchy to confluent supratentorial white matter FLAIR T2 hyperintensities including disproportion subcortical white matter involvement. No suspicious parenchymal signal, masses or mass effect. No abnormal extra-axial fluid collections. No extra-axial masses though, contrast enhanced sequences would be more sensitive. VASCULAR: Normal major intracranial vascular flow voids present at skull base. SKULL AND UPPER CERVICAL SPINE: No abnormal sellar expansion. No suspicious calvarial bone marrow signal. Craniocervical junction maintained. SINUSES/ORBITS: The mastoid air-cells and included paranasal sinuses are well-aerated. Status post bilateral ocular lens implants. The included ocular globes and  orbital contents are non-suspicious. OTHER: None. MRA HEAD FINDINGS- signal loss favored to reflect patient motion. ANTERIOR CIRCULATION: Flow related enhancement of the included cervical, petrous, cavernous and supraclinoid internal carotid arteries. Narrowed LEFT cavernous internal carotid artery flow void favoring artifact. Patent anterior communicating artery. Normal flow related enhancement of the anterior and middle cerebral arteries, including distal segments. No  large vessel occlusion, high-grade stenosis, abnormal luminal irregularity, aneurysm. POSTERIOR CIRCULATION: Codominant vertebral artery's. Basilar artery is patent, with normal flow related enhancement of the main branch vessels. Normal flow related enhancement of the posterior cerebral arteries. Mild stenosis RIGHT P1 2 junction versus flow artifact from tortuosity. No large vessel occlusion, high-grade stenosis, abnormal luminal irregularity, aneurysm. IMPRESSION: MRI HEAD: No acute intracranial process. Moderate to severe white matter changes most compatible with chronic small vessel ischemic disease. Considering distribution of white matter changes, superimposed chronic changes from trauma, old infection or vasculopathy are possible. MRA HEAD: No emergent large vessel occlusion or severe stenosis. Electronically Signed   By: Awilda Metroourtnay  Bloomer M.D.   On: 12/06/2015 15:03   Ct Head Code Stroke W/o Cm  Result Date: 12/06/2015 CLINICAL DATA:  Code stroke. 82 year old female with syncope at 1015 hours. Subsequent confusion. Initial encounter. EXAM: CT HEAD WITHOUT CONTRAST TECHNIQUE: Contiguous axial images were obtained from the base of the skull through the vertex without intravenous contrast. COMPARISON:  Head CT without contrast 02/26/2015 FINDINGS: Brain: Patchy and widespread chronic white matter disease in both hemispheres appears stable. Temporal lobe involvement, especially the anterior left temporal lobe, appears severe, but not  definitely different than on the prior study. No acute intracranial hemorrhage identified. No midline shift, mass effect, or evidence of intracranial mass lesion. No ventriculomegaly. No cortically based acute infarct identified. Vascular: Calcified atherosclerosis at the skull base. No suspicious intracranial vascular hyperdensity. Skull: No acute osseous abnormality identified. Sinuses/Orbits: Visualized paranasal sinuses and mastoids are stable and well pneumatized. Other: No acute orbit or scalp soft tissue findings. ASPECTS [facility]([place] Stroke Program Early CT Score) - Ganglionic level infarction (caudate, lentiform nuclei, internal capsule, insula, M1-M3 cortex): 7 - Supraganglionic infarction (M4-M6 cortex): 3 Total score (0-10 with 10 being normal): 10 IMPRESSION: 1. No acute cortically based infarct or acute intracranial hemorrhage identified. 2. Advanced chronic white matter disease not definitely progressed since January. Left greater than right temporal lobe involvement noted. 3. ASPECTS is 10. 4. Study discussed by telephone with Dr. Samuel JesterKATHLEEN MCMANUS on 12/06/2015 at 11:38 . Electronically Signed   By: Odessa FlemingH  Hall M.D.   On: 12/06/2015 11:39       Subjective: Feeling better. No lightheadedness or dizziness. No chest pain  Discharge Exam: Vitals:   12/07/15 1024 12/07/15 1445  BP: 136/63 127/68  Pulse: 73 74  Resp:  18  Temp:  98.5 F (36.9 C)   Vitals:   12/06/15 2230 12/07/15 0628 12/07/15 1024 12/07/15 1445  BP: (!) 111/54 99/60 136/63 127/68  Pulse: (!) 56 79 73 74  Resp: 18 18  18   Temp: 98.6 F (37 C) 98.3 F (36.8 C)  98.5 F (36.9 C)  TempSrc: Oral Oral  Oral  SpO2: 97% 99%  99%  Weight:      Height:        General: Pt is alert, awake, not in acute distress Cardiovascular: irregular, S1/S2 +, no rubs, no gallops Respiratory: CTA bilaterally, no wheezing, no rhonchi Abdominal: Soft, NT, ND, bowel sounds + Extremities: no edema, no cyanosis    The results of  significant diagnostics from this hospitalization (including imaging, microbiology, ancillary and laboratory) are listed below for reference.     Microbiology: No results found for this or any previous visit (from the past 240 hour(s)).   Labs: BNP (last 3 results) No results for input(s): BNP in the last 8760 hours. Basic Metabolic Panel:  Recent Labs Lab 12/06/15 1109 12/06/15 1141 12/07/15 0528  NA 126*  --  129*  K 2.8*  --  5.2*  CL 91*  --  102  CO2 25  --  22  GLUCOSE 128*  --  89  BUN 21*  --  14  CREATININE 0.87  --  0.68  CALCIUM 9.0  --  8.1*  MG  --  2.1  --    Liver Function Tests:  Recent Labs Lab 12/06/15 1109  AST 22  ALT 20  ALKPHOS 56  BILITOT 1.2  PROT 6.6  ALBUMIN 4.1   No results for input(s): LIPASE, AMYLASE in the last 168 hours. No results for input(s): AMMONIA in the last 168 hours. CBC:  Recent Labs Lab 12/06/15 1109 12/07/15 0528  WBC 11.9* 9.7  NEUTROABS 6.0  --   HGB 14.7 12.7  HCT 42.6 37.7  MCV 87.1 89.3  PLT 406* 345   Cardiac Enzymes:  Recent Labs Lab 12/06/15 1820 12/06/15 2329 12/07/15 0528  TROPONINI 0.03* 0.03* 0.03*   BNP: Invalid input(s): POCBNP CBG:  Recent Labs Lab 12/06/15 1106  GLUCAP 121*   D-Dimer No results for input(s): DDIMER in the last 72 hours. Hgb A1c No results for input(s): HGBA1C in the last 72 hours. Lipid Profile No results for input(s): CHOL, HDL, LDLCALC, TRIG, CHOLHDL, LDLDIRECT in the last 72 hours. Thyroid function studies  Recent Labs  12/06/15 1141  TSH 1.845   Anemia work up No results for input(s): VITAMINB12, FOLATE, FERRITIN, TIBC, IRON, RETICCTPCT in the last 72 hours. Urinalysis    Component Value Date/Time   COLORURINE YELLOW 12/06/2015 1117   APPEARANCEUR CLEAR 12/06/2015 1117   LABSPEC 1.020 12/06/2015 1117   PHURINE 6.5 12/06/2015 1117   GLUCOSEU NEGATIVE 12/06/2015 1117   HGBUR NEGATIVE 12/06/2015 1117   BILIRUBINUR NEGATIVE 12/06/2015 1117    KETONESUR NEGATIVE 12/06/2015 1117   PROTEINUR NEGATIVE 12/06/2015 1117   UROBILINOGEN 0.2 02/15/2009 1217   NITRITE NEGATIVE 12/06/2015 1117   LEUKOCYTESUR NEGATIVE 12/06/2015 1117   Sepsis Labs Invalid input(s): PROCALCITONIN,  WBC,  LACTICIDVEN Microbiology No results found for this or any previous visit (from the past 240 hour(s)).   Time coordinating discharge: Over 30 minutes  SIGNED:   Erick Blinks, MD  [facility] 12/07/2015, 5:40 PM Pager   If 7PM-7AM, please contact night-coverage [email or web address] Password [email or web address]

## 2015-12-09 LAB — URINE CULTURE: Culture: 20000 — AB

## 2015-12-24 ENCOUNTER — Telehealth: Payer: Self-pay | Admitting: Cardiovascular Disease

## 2015-12-24 NOTE — Telephone Encounter (Addendum)
Pt having epidural pain injection  Will forward to M lenze PA-c who is covering pt's MD, Dr Purvis SheffieldKoneswaran

## 2015-12-24 NOTE — Telephone Encounter (Signed)
Patient is having procedure 11/16 @ 11am. Needs letter stating that it is ok for patient to come off of Xarelto. / tg

## 2015-12-24 NOTE — Telephone Encounter (Signed)
Patient should be ok to come off Xarelto for epidural but we need to know how long she will be off it? (Routing comment)   Above note by Leda GauzeM Lenze PA-C   Pt will have her MD send release papers

## 2015-12-24 NOTE — Telephone Encounter (Signed)
LM asking pt to call back, I need to know what type of procedure she is having-cc

## 2016-01-01 ENCOUNTER — Encounter: Payer: Self-pay | Admitting: Adult Health

## 2016-01-01 ENCOUNTER — Ambulatory Visit (INDEPENDENT_AMBULATORY_CARE_PROVIDER_SITE_OTHER): Payer: Medicare Other | Admitting: Adult Health

## 2016-01-01 VITALS — BP 150/90 | HR 60 | Ht 61.0 in | Wt 136.0 lb

## 2016-01-01 DIAGNOSIS — K649 Unspecified hemorrhoids: Secondary | ICD-10-CM | POA: Diagnosis not present

## 2016-01-01 DIAGNOSIS — N816 Rectocele: Secondary | ICD-10-CM | POA: Diagnosis not present

## 2016-01-01 DIAGNOSIS — Z1211 Encounter for screening for malignant neoplasm of colon: Secondary | ICD-10-CM | POA: Diagnosis not present

## 2016-01-01 LAB — HEMOCCULT GUIAC POC 1CARD (OFFICE): Fecal Occult Blood, POC: NEGATIVE

## 2016-01-01 MED ORDER — HYDROCORTISONE ACE-PRAMOXINE 1-1 % RE CREA: 1.0000 "application " | TOPICAL_CREAM | Freq: Two times a day (BID) | RECTAL | 1 refills | Status: DC

## 2016-01-01 NOTE — Progress Notes (Signed)
Subjective:     Patient ID: Katherine Lane, female   DOB: 16-Jan-1933, 82 y.o.   MRN: [name]  HPI Katherine RhodesBetty "Katherine RousMozelle" is a 82 year old white female,widowed in complaining of pain in rectal area, hurts to sit, hurts at tailbone.Has chronic back pain and is seeing Dr Venetia MaxonStern and is to get injections soon.Had CT and has bulging discs as low as L3-4.Has to use fingers to push BM out at times and is using miralax about once a week. PCP is Katherine SouthSteve Lane.  Review of Systems + pain in rectal area, hurts to sit +chronic back pain Reviewed past medical,surgical, social and family history. Reviewed medications and allergies.     Objective:   Physical Exam BP (!) 150/90 (BP Location: Right Arm, Patient Position: Sitting, Cuff Size: Normal)   Pulse 60   Ht 5\' 1"  (1.549 m)   Wt 136 lb (61.7 kg)   BMI 25.70 kg/m    Skin warm and dry.Pelvic: external genitalia is normal in appearance no lesions, vagina: pale with loss of moisture and rugae,urethra has no lesions or masses noted, cervix and uterus are absent, adnexa: no masses or tenderness noted. Bladder is non tender and no masses felt. On rectal exam has good tone, with + hemorrhoids and hemoccult was negative, and + rectocele. PHQ 9 score 3. Explained rectocele and gave handout on it.Will try get stool softer and shrinking hemorrhoids.She is aware surgery is option, but not first choice.   Assessment:     1. Rectocele   2. Hemorrhoids, unspecified hemorrhoid type       Plan:     Rx analpram HC 1-1% cream use bid, #30 gm  with 1 refill Review handout on rectocele Try miralax every other day or every 3 days and eat 1/2 cup real oatmeal and 1/2 cup applesauce and 1/2 cup prune juice every day. Follow up prn

## 2016-01-01 NOTE — Patient Instructions (Signed)
Follow up prn

## 2016-01-29 ENCOUNTER — Encounter: Payer: Self-pay | Admitting: Cardiovascular Disease

## 2016-01-29 ENCOUNTER — Ambulatory Visit (INDEPENDENT_AMBULATORY_CARE_PROVIDER_SITE_OTHER): Payer: Medicare Other | Admitting: Cardiovascular Disease

## 2016-01-29 VITALS — BP 164/78 | HR 90 | Ht 61.0 in | Wt 136.0 lb

## 2016-01-29 DIAGNOSIS — I480 Paroxysmal atrial fibrillation: Secondary | ICD-10-CM | POA: Diagnosis not present

## 2016-01-29 DIAGNOSIS — Z9289 Personal history of other medical treatment: Secondary | ICD-10-CM | POA: Diagnosis not present

## 2016-01-29 DIAGNOSIS — J0100 Acute maxillary sinusitis, unspecified: Secondary | ICD-10-CM

## 2016-01-29 DIAGNOSIS — I10 Essential (primary) hypertension: Secondary | ICD-10-CM

## 2016-01-29 MED ORDER — AMOXICILLIN-POT CLAVULANATE 500-125 MG PO TABS: 1.0000 | ORAL_TABLET | Freq: Three times a day (TID) | ORAL | 0 refills | Status: DC

## 2016-01-29 NOTE — Progress Notes (Signed)
SUBJECTIVE: Katherine Lane returns for routine follow up. She has a history of paroxysmal atrial fibrillation, essential hypertension, and benign positional vertigo.  She was hospitalized for transient confusion with transient decrease in vision in October 2017. MRI showed no evidence of stroke. She did have evidence of volume depletion with hyponatremia as she was self administering too much of her diuretic. Hydrochlorthiazide was discontinued.  She has been struggling with a sinus infection for the past 10 days. This occurs once a year around this time. She does not have fevers. She was experiencing palpitations until about 2 weeks ago.  Review of Systems: As per "subjective", otherwise negative.  Allergies  Allergen Reactions  . Codeine Nausea And Vomiting    Patient states intolerance to all pain medications  . Tramadol     Current Outpatient Prescriptions  Medication Sig Dispense Refill  . enalapril (VASOTEC) 20 MG tablet TAKE (1) TABLET BY MOUTH TWICE DAILY. 180 tablet 3  . LORazepam (ATIVAN) 0.5 MG tablet TAKE (1) TABLET DAILY AS NEEDED. 30 tablet 5  . metoprolol succinate (TOPROL-XL) 25 MG 24 hr tablet TAKE ONE TABLET BY MOUTH ONCE DAILY. 90 tablet 3  . polyethylene glycol powder (MIRALAX) powder Take 1 Container by mouth. Every 4-5 days    . pramoxine-hydrocortisone (ANALPRAM-HC) 1-1 % rectal cream Place 1 application rectally 2 (two) times daily. 30 g 1  . UNABLE TO FIND Injections in back    . verapamil (CALAN-SR) 240 MG CR tablet TAKE (1) TABLET BY MOUTH EACH MORNING. 30 tablet 11  . XARELTO 20 MG TABS tablet TAKE 1 TABLET DAILY WITH SUPPER. 30 tablet 6  . zolpidem (AMBIEN) 10 MG tablet TAKE 1 TABLET BY MOUTH AT BEDTIME FOR SLEEP. 30 tablet 5   No current facility-administered medications for this visit.     Past Medical History:  Diagnosis Date  . A-fib (HCC)   . Anxiety   . Arthritis   . Chronic back pain   . Constipation, chronic   . Depression   . HTN  (hypertension)   . Insomnia   . Migraine headache   . Osteoporosis   . PONV (postoperative nausea and vomiting)   . Trigeminal neuralgia     Past Surgical History:  Procedure Laterality Date  . ANKLE FRACTURE SURGERY     2010-right  . CARDIAC CATHETERIZATION     denies  . CATARACT EXTRACTION W/PHACO Right 07/22/2012   Procedure: CATARACT EXTRACTION PHACO AND INTRAOCULAR LENS PLACEMENT (IOC);  Surgeon: Gemma PayorKerry Hunt, MD;  Location: [facility];  Service: Ophthalmology;  Laterality: Right;  CDE:  18.93  . CATARACT EXTRACTION W/PHACO Left 08/09/2012   Procedure: CATARACT EXTRACTION PHACO AND INTRAOCULAR LENS PLACEMENT (IOC);  Surgeon: Gemma PayorKerry Hunt, MD;  Location: [facility];  Service: Ophthalmology;  Laterality: Left;  CDE: 17.21  . COLONOSCOPY    . HARDWARE REMOVAL  07/18/2011   Procedure: HARDWARE REMOVAL;  Surgeon: Vickki HearingStanley E Harrison, MD;  Location: [facility];  Service: Orthopedics;  Laterality: Right;  . NERVE REPAIR  07/18/2011   Procedure: NERVE REPAIR;  Surgeon: Vickki HearingStanley E Harrison, MD;  Location: [facility];  Service: Orthopedics;  Laterality: Right;  Right leg superficial peroneal nerve release    . PARTIAL HYSTERECTOMY      Social History   Social History  . Marital status: Widowed    Spouse name: N/A  . Number of children: N/A  . Years of education: N/A   Occupational History  . retired Retired   [employer]  History Main Topics  . Smoking status: Never Smoker  . Smokeless tobacco: Never Used  . Alcohol use No  . Drug use: No  . Sexual activity: Not Currently    Birth control/ protection: Surgical     Comment: hyst   Other Topics Concern  . Not on file   Social History Narrative  . No narrative on file     Vitals:   01/29/16 0826  BP: (!) 164/78  Pulse: 90  SpO2: 96%  Weight: 136 lb (61.7 kg)  Height: 5\' 1"  (1.549 m)    PHYSICAL EXAM General: NAD HEENT: Normal. Neck: No JVD, no thyromegaly. Lungs: Clear to auscultation bilaterally with normal respiratory effort. CV:  Nondisplaced PMI.  Regular rate and rhythm, normal S1/S2, no S3/S4, no murmur. No pretibial or periankle edema.   Abdomen: Soft, nontender, no distention.  Neurologic: Alert and oriented.  Psych: Normal affect. Skin: Normal. Musculoskeletal: No gross deformities.    ECG: Most recent ECG reviewed.      ASSESSMENT AND PLAN: 1. Paroxysmal atrial fibrillation: Symptomatically stable on verapamil and metoprolol succinate. Continue Xarelto.   2. Essential HTN: Elevated but usually normal. Will monitor.  3. Acute sinusitis: Will prescribe Augmentin 500 mg tid x 1 week.  Dispo: f/u 6 months    Prentice DockerSuresh Koneswaran, M.D., F.A.C.C.

## 2016-01-29 NOTE — Patient Instructions (Addendum)
Your physician wants you to follow-up in: 6 months Dr Reggy EyeKoneswaran You will receive a reminder letter in the mail two months in advance. If you don't receive a letter, please call our office to schedule the follow-up appointment.    Take Augmentin 500 mg three times a day for 1 week for acute sinusitis    Your physician recommends that you continue on your current medications as directed. Please refer to the Current Medication list given to you today.     Thank you for choosing [facility] !

## 2016-01-31 ENCOUNTER — Ambulatory Visit: Payer: Medicare Other | Admitting: Cardiovascular Disease

## 2016-02-19 ENCOUNTER — Other Ambulatory Visit: Payer: Self-pay | Admitting: Cardiovascular Disease

## 2016-02-22 ENCOUNTER — Encounter: Payer: Self-pay | Admitting: Family Medicine

## 2016-02-22 ENCOUNTER — Ambulatory Visit (HOSPITAL_COMMUNITY)
Admission: RE | Admit: 2016-02-22 | Discharge: 2016-02-22 | Disposition: A | Discharge status: Home / Self Care | Payer: Medicare Other | Source: Ambulatory Visit | Attending: Family Medicine | Admitting: Family Medicine

## 2016-02-22 ENCOUNTER — Ambulatory Visit (INDEPENDENT_AMBULATORY_CARE_PROVIDER_SITE_OTHER): Payer: Medicare Other | Admitting: Family Medicine

## 2016-02-22 VITALS — BP 120/82 | Temp 98.6°F | Ht 61.5 in | Wt 136.1 lb

## 2016-02-22 DIAGNOSIS — R300 Dysuria: Secondary | ICD-10-CM | POA: Diagnosis not present

## 2016-02-22 DIAGNOSIS — N3000 Acute cystitis without hematuria: Secondary | ICD-10-CM

## 2016-02-22 DIAGNOSIS — J209 Acute bronchitis, unspecified: Secondary | ICD-10-CM | POA: Insufficient documentation

## 2016-02-22 LAB — POCT URINALYSIS DIPSTICK
Protein, UA: 30
Spec Grav, UA: 1.025
pH, UA: 5

## 2016-02-22 MED ORDER — CEFPROZIL 500 MG PO TABS: 500.0000 mg | ORAL_TABLET | Freq: Two times a day (BID) | ORAL | 0 refills | Status: DC

## 2016-02-22 MED ORDER — FLUCONAZOLE 150 MG PO TABS: 150.0000 mg | ORAL_TABLET | Freq: Once | ORAL | 4 refills | Status: AC

## 2016-02-22 NOTE — Progress Notes (Signed)
   Subjective:    Patient ID: Katherine Lane, female    DOB: 1932-05-01, 83 y.o.   MRN: [id number]  Sinusitis  This is a new problem. The current episode started more than 1 month ago. The problem is unchanged. There has been no fever. The pain is moderate. Associated symptoms include congestion and coughing. Past treatments include antibiotics. The treatment provided no relief.  Dysuria   This is a new problem. The current episode started 1 to 4 weeks ago. The problem occurs intermittently. The problem has been unchanged. The quality of the pain is described as burning. The pain is moderate. There has been no fever. She has tried increased fluids for the symptoms. The treatment provided no relief.   Multiple weeks of head congestion chest congestion not feeling well was seen by cardiology mid-December at that time was prescribed Augmentin for a sinus infection never got better is having persistent symptoms. No wheezing difficulty breathing no vomiting or diarrhea is having dysuria also since being on the antibiotic some vaginal itching. PMH benign   Review of Systems  HENT: Positive for congestion.   Respiratory: Positive for cough.   Genitourinary: Positive for dysuria.       Objective:   Physical Exam Lungs are clear no crackles heart regular chest congestion noted with a deep cough HEENT is benign   Urinalysis with wbc's    Assessment & Plan:  A/P-UTI-culture sent Viral syndrome secondary sinus infection Bronchitis possible pneumonia because patient is having soreness in her back with the deep cough with coughing I recommend chest x-ray to rule out pneumonia Antibiotics sent in if progressive symptoms or not improved over the course of the next 1-2 weeks to immediately follow-up sooner problems

## 2016-02-24 LAB — URINE CULTURE

## 2016-03-03 ENCOUNTER — Other Ambulatory Visit: Payer: Self-pay | Admitting: Family Medicine

## 2016-03-12 ENCOUNTER — Other Ambulatory Visit: Payer: Self-pay | Admitting: Cardiovascular Disease

## 2016-03-17 ENCOUNTER — Ambulatory Visit (INDEPENDENT_AMBULATORY_CARE_PROVIDER_SITE_OTHER): Payer: Medicare Other | Admitting: Family Medicine

## 2016-03-17 ENCOUNTER — Encounter: Payer: Self-pay | Admitting: Family Medicine

## 2016-03-17 VITALS — BP 122/74 | Temp 99.1°F | Wt 138.0 lb

## 2016-03-17 DIAGNOSIS — N390 Urinary tract infection, site not specified: Secondary | ICD-10-CM | POA: Diagnosis not present

## 2016-03-17 DIAGNOSIS — R300 Dysuria: Secondary | ICD-10-CM | POA: Diagnosis not present

## 2016-03-17 DIAGNOSIS — R0982 Postnasal drip: Secondary | ICD-10-CM | POA: Diagnosis not present

## 2016-03-17 LAB — POCT URINALYSIS DIPSTICK
Spec Grav, UA: 1.025
Urobilinogen, UA: 0.2
pH, UA: 5

## 2016-03-17 NOTE — Progress Notes (Signed)
   Subjective:    Patient ID: Katherine Lane, female    DOB: 12/21/1932, 83 y.o.   MRN: [id number]  HPIRecheck urine. Pt states she is not having any urinary symptoms. Finished cefzil.   Recheck cough. Some days pt thinks cough is better then some days worse. Coughing up clear mucus.   Patient has had some congestion drainage. Occasional cough. Generally nonproductive.  Review of Systems No headache no chest pain no abdominal pain    Objective:   Physical Exam  Alert anxious appearing HET mild nasal congestion pharynx normal neck supple lungs clear. Heart regular in rhythm.  Urinalysis normal      Assessment & Plan:  Impression 1 urinary tract infection resolved #2 subacute cough likely residual drainage from respiratory infection. Encouraged to hold off on antibiotics right now rationale discussed patient in agreement

## 2016-03-20 ENCOUNTER — Other Ambulatory Visit: Payer: Self-pay | Admitting: Cardiovascular Disease

## 2016-03-31 ENCOUNTER — Other Ambulatory Visit: Payer: Self-pay | Admitting: Family Medicine

## 2016-03-31 NOTE — Telephone Encounter (Signed)
Six mo worth 

## 2016-04-08 ENCOUNTER — Telehealth: Payer: Self-pay | Admitting: Cardiovascular Disease

## 2016-04-08 MED ORDER — VALSARTAN 80 MG PO TABS: 80.0000 mg | ORAL_TABLET | Freq: Every day | ORAL | 1 refills | Status: DC

## 2016-04-08 NOTE — Telephone Encounter (Signed)
Chest xray from January showed clear lungs (I reviewed). How long has she been on enalapril? Could consider switching to Diovan. Cough may be related to upper respiratory tract rather than lungs, especially given lack of shortness of breath. "Fluid around the heart" would also lead to difficulty breathing, not just a cough. Might first consider seeing ENT.

## 2016-04-08 NOTE — Telephone Encounter (Addendum)
Pt declined apt's ,states "I am not coming this week,I see Dr Gerda DissLuking on Monday"  I told her to give the Diovan a month to see if her cough improves.     Pt calls back and said it is not her choice to see the NP but her daughters want her to be seen.Apt Friday at 230 pm

## 2016-04-08 NOTE — Telephone Encounter (Signed)
Try Diovan 80 mg. Before ordering cardiac or pulmonary testing, she would need a clinical evaluation. Have her see either Leda GauzeM. Lenze or K. Lawrence.

## 2016-04-08 NOTE — Telephone Encounter (Signed)
Please give pt a call, she's not feeling well and would like to speak w/ someone .[phone] or cell # [phone]

## 2016-04-08 NOTE — Telephone Encounter (Signed)
Pt still has cough for past 2 1/2 months.Was given antibiotics twice, had chest x-ray she states was "ok" and has seen pcp.States she has "empty feeling" in chest and worried she has fluid around her heart like her husband had. Denies SOB, wt gain, or leg swelling.She states her children need to know. She says cough is clear fluid, I asked if she possibly had reflux and she said no.I suggested she see pcp and that I would also message Dr Purvis SheffieldKoneswaran today. I offered next available apt with Dr Purvis SheffieldKoneswaran which is in March and she declined. I told her I would respond back to her after Dr Purvis SheffieldKoneswaran gets this message

## 2016-04-08 NOTE — Addendum Note (Signed)
Addended by: Marlyn CorporalARLTON, Anai Lipson A on: 04/08/2016 04:49 PM   Modules accepted: Orders

## 2016-04-08 NOTE — Telephone Encounter (Signed)
Attempted to reach, got vm,[id number] 

## 2016-04-08 NOTE — Telephone Encounter (Signed)
Pt is willing to try Diovan , what dose do you recommend ?   She is insistent it is fluid around her heart.Does not want to see ENT

## 2016-04-10 NOTE — Progress Notes (Signed)
Cardiology Office Note   Date:  04/11/2016   ID:  Katherine Lane January 24, 1933, MRN [id number]  PCP:  Lubertha South, MD  Cardiologist:Koneswaran/ Katherine Reining, NP   Chief Complaint  Patient presents with  . Hypertension      History of Present Illness: Katherine Lane is a 83 y.o. female who presents for ongoing assessment and management of hypertension, with recent change to Diovan 80 mg. She complained of coughing and empty feeling in her chest. Other history include PAF and benign postural vertigo.   She comes today with multiple complaints. Hollow feeling in her chest with frequent coughing and phlegm production. The patient states that she occasionally is having palpitations. She denies any chest pain, dizziness, bleeding issues, or rapid heart rhythm. She continues to have recurrent coughing with clear to white mucus production usually at night before she goes to bed when lying flat. This has worsened significantly over the last two months. . Blood pressure today is elevated.  Past Medical History:  Diagnosis Date  . A-fib (HCC)   . Anxiety   . Arthritis   . Chronic back pain   . Constipation, chronic   . Depression   . HTN (hypertension)   . Insomnia   . Migraine headache   . Osteoporosis   . PONV (postoperative nausea and vomiting)   . Trigeminal neuralgia     Past Surgical History:  Procedure Laterality Date  . ANKLE FRACTURE SURGERY     2010-right  . CARDIAC CATHETERIZATION     denies  . CATARACT EXTRACTION W/PHACO Right 07/22/2012   Procedure: CATARACT EXTRACTION PHACO AND INTRAOCULAR LENS PLACEMENT (IOC);  Surgeon: Gemma Payor, MD;  Location: [facility];  Service: Ophthalmology;  Laterality: Right;  CDE:  18.93  . CATARACT EXTRACTION W/PHACO Left 08/09/2012   Procedure: CATARACT EXTRACTION PHACO AND INTRAOCULAR LENS PLACEMENT (IOC);  Surgeon: Gemma Payor, MD;  Location: [facility];  Service: Ophthalmology;  Laterality: Left;  CDE: 17.21  . COLONOSCOPY    . HARDWARE  REMOVAL  07/18/2011   Procedure: HARDWARE REMOVAL;  Surgeon: Vickki Hearing, MD;  Location: [facility];  Service: Orthopedics;  Laterality: Right;  . NERVE REPAIR  07/18/2011   Procedure: NERVE REPAIR;  Surgeon: Vickki Hearing, MD;  Location: [facility];  Service: Orthopedics;  Laterality: Right;  Right leg superficial peroneal nerve release    . PARTIAL HYSTERECTOMY       Current Outpatient Prescriptions  Medication Sig Dispense Refill  . LORazepam (ATIVAN) 0.5 MG tablet TAKE (1) TABLET DAILY AS NEEDED. 30 tablet 5  . metoprolol succinate (TOPROL-XL) 25 MG 24 hr tablet TAKE ONE TABLET BY MOUTH ONCE DAILY. 90 tablet 3  . polyethylene glycol powder (MIRALAX) powder Take 1 Container by mouth. Every 4-5 days    . pramoxine-hydrocortisone (ANALPRAM-HC) 1-1 % rectal cream Place 1 application rectally 2 (two) times daily. 30 g 1  . UNABLE TO FIND Injections in back    . valsartan (DIOVAN) 80 MG tablet Take 1 tablet (80 mg total) by mouth daily. 30 tablet 1  . verapamil (CALAN-SR) 240 MG CR tablet TAKE (1) TABLET BY MOUTH EACH MORNING. 30 tablet 5  . XARELTO 20 MG TABS tablet TAKE 1 TABLET DAILY WITH SUPPER. 30 tablet 6  . zolpidem (AMBIEN) 10 MG tablet TAKE 1 TABLET BY MOUTH AT BEDTIME FOR SLEEP. 30 tablet 5  . benzonatate (TESSALON PERLES) 100 MG capsule Take 1 capsule (100 mg total) by mouth 2 (two) times daily. 20  capsule 0  . hydrochlorothiazide (MICROZIDE) 12.5 MG capsule Take 1 capsule (12.5 mg total) by mouth daily. 90 capsule 3  . pantoprazole (PROTONIX) 40 MG tablet Take 1 tablet (40 mg total) by mouth daily. 30 tablet 11   No current facility-administered medications for this visit.     Allergies:   Codeine and Tramadol    Social History:  The patient  reports that she has never smoked. She has never used smokeless tobacco. She reports that she does not drink alcohol or use drugs.   Family History:  The patient's family history includes Alzheimer's disease in her mother; Cancer in  her mother; Diabetes in her brother and daughter; Hypertension in her father and mother; Other in her brother and father.    ROS: All other systems are reviewed and negative. Unless otherwise mentioned in H&P    PHYSICAL EXAM: VS:  BP (!) 162/78   Pulse 62   Ht 5\' 3"  (1.6 m)   Wt 148 lb (67.1 kg)   SpO2 96%   BMI 26.22 kg/m  , BMI Body mass index is 26.22 kg/m. GEN: Well nourished, well developed, in no acute distress  HEENT: normal  Neck: no JVD, carotid bruits, or masses Cardiac: RRR; frequent extra systoles  no murmurs, rubs, or gallops,no edema  Respiratory:  Crackles in the left base. GI: soft, nontender, nondistended, + BS MS: no deformity or atrophy  Skin: warm and dry, no rash Neuro:  Strength and sensation are intact Psych: euthymic mood, full affect  EKG:   NSR with PAC's   Recent Labs: 12/06/2015: ALT 20; TSH 1.845 04/11/2016: BUN 11; Creatinine, Ser 0.74; Hemoglobin 13.3; Magnesium 2.2; Platelets 315; Potassium 3.4; Sodium 138    Lipid Panel    Component Value Date/Time   CHOL 177 09/24/2015 0837   TRIG 133 09/24/2015 0837   HDL 57 09/24/2015 0837   CHOLHDL 3.1 09/24/2015 0837   CHOLHDL 3.1 11/07/2013 0807   VLDL 26 11/07/2013 0807   LDLCALC 93 09/24/2015 0837      Wt Readings from Last 3 Encounters:  04/11/16 148 lb (67.1 kg)  03/17/16 138 lb (62.6 kg)  02/22/16 136 lb 2 oz (61.7 kg)      Other studies Reviewed:  Echocardiogram 05/2013.Marland Kitchen  Left ventricle: The cavity size was normal. Wall thickness was increased in a pattern of mild LVH. Systolic function was vigorous. The estimated ejection fraction was in the range of 65% to 70%. Wall motion was normal; there were no regional wall motion abnormalities. Features are consistent with a pseudonormal left ventricular filling pattern, with concomitant abnormal relaxation and increased filling pressure (grade 2 diastolic dysfunction). - Aortic valve: Trileaflet; mildly calcified  leaflets. Mild regurgitation. - Aortic root: The aortic root was mildly dilated. - Mitral valve: Calcified annulus. Mild regurgitation. - Left atrium: The atrium was mildly dilated. - Right ventricle: The cavity size was mildly dilated. - Right atrium: Central venous pressure: 3mm Hg (est). - Atrial septum: No defect or patent foramen ovale was identified. - Tricuspid valve: Trivial regurgitation. - Pulmonary arteries: PA peak pressure: 34mm Hg (S). - Pericardium, extracardiac: There was no pericardial effusion.   ASSESSMENT AND PLAN:  1.  Hypertension: Elevated today. On Diovan 80 mg daily along with verapamil and metoprolol. Will add HCTZ 12.5 mg daily.  Will need follow up echo. Last echo   2. Cough and congestion: Will treat symptomatically with Tessalon Perles, prn. Check a CXR. She may have bronchitis with worsening inflammation due to  frequent coughing and mucus production. Will place her on a low dose steroid dose pack. Add PPI to avoid stomach irritation.  Labs to follow: CBC, BMET, Mg.   I have made her a follow up appointment with PCP on Monday at 10 am.   3. Hx of PAF: EKG demonstrates NSR with PAC's. She will continue on Xarelto for CVA prophylaxis.   Current medicines are reviewed at length with the patient today.    Labs/ tests ordered today include:   Orders Placed This Encounter  Procedures  . DG Chest 2 View  . CBC with Differential  . Magnesium  . Basic Metabolic Panel (BMET)  . EKG 12-Lead     Disposition:   FU with Dr. Purvis SheffieldKoneswaran in one month.  Signed, Katherine ReiningKathryn Iolanda Folson, NP  04/11/2016 5:01 PM    [facility] [address], [place], [place] [address] Phone: [phone]; Fax: [phone]

## 2016-04-11 ENCOUNTER — Other Ambulatory Visit (HOSPITAL_COMMUNITY)
Admission: RE | Admit: 2016-04-11 | Discharge: 2016-04-11 | Disposition: A | Discharge status: Home / Self Care | Payer: Medicare Other | Source: Ambulatory Visit | Attending: Adult Health | Admitting: Adult Health

## 2016-04-11 ENCOUNTER — Ambulatory Visit (HOSPITAL_COMMUNITY)
Admission: RE | Admit: 2016-04-11 | Discharge: 2016-04-11 | Disposition: A | Discharge status: Home / Self Care | Payer: Medicare Other | Source: Ambulatory Visit | Attending: Adult Health | Admitting: Adult Health

## 2016-04-11 ENCOUNTER — Encounter: Payer: Self-pay | Admitting: Adult Health

## 2016-04-11 ENCOUNTER — Ambulatory Visit (INDEPENDENT_AMBULATORY_CARE_PROVIDER_SITE_OTHER): Payer: Medicare Other | Admitting: Adult Health

## 2016-04-11 VITALS — BP 162/78 | HR 62 | Ht 63.0 in | Wt 148.0 lb

## 2016-04-11 DIAGNOSIS — I4892 Unspecified atrial flutter: Secondary | ICD-10-CM

## 2016-04-11 DIAGNOSIS — R05 Cough: Secondary | ICD-10-CM | POA: Diagnosis not present

## 2016-04-11 DIAGNOSIS — I10 Essential (primary) hypertension: Secondary | ICD-10-CM | POA: Diagnosis not present

## 2016-04-11 DIAGNOSIS — I517 Cardiomegaly: Secondary | ICD-10-CM | POA: Insufficient documentation

## 2016-04-11 DIAGNOSIS — R059 Cough, unspecified: Secondary | ICD-10-CM

## 2016-04-11 LAB — CBC WITH DIFFERENTIAL/PLATELET
Basophils Absolute: 0 10*3/uL (ref 0.0–0.1)
Basophils Relative: 0 %
Eosinophils Absolute: 0 10*3/uL (ref 0.0–0.7)
Eosinophils Relative: 0 %
HCT: 39.7 % (ref 36.0–46.0)
Hemoglobin: 13.3 g/dL (ref 12.0–15.0)
Lymphocytes Relative: 40 %
Lymphs Abs: 2.6 10*3/uL (ref 0.7–4.0)
MCH: 30.7 pg (ref 26.0–34.0)
MCHC: 33.5 g/dL (ref 30.0–36.0)
MCV: 91.7 fL (ref 78.0–100.0)
Monocytes Absolute: 0.5 10*3/uL (ref 0.1–1.0)
Monocytes Relative: 8 %
Neutro Abs: 3.3 10*3/uL (ref 1.7–7.7)
Neutrophils Relative %: 52 %
Platelets: 315 10*3/uL (ref 150–400)
RBC: 4.33 MIL/uL (ref 3.87–5.11)
RDW: 13.3 % (ref 11.5–15.5)
WBC: 6.4 10*3/uL (ref 4.0–10.5)

## 2016-04-11 LAB — BASIC METABOLIC PANEL WITH GFR
Anion gap: 6 (ref 5–15)
BUN: 11 mg/dL (ref 6–20)
CO2: 30 mmol/L (ref 22–32)
Calcium: 9.5 mg/dL (ref 8.9–10.3)
Chloride: 102 mmol/L (ref 101–111)
Creatinine, Ser: 0.74 mg/dL (ref 0.44–1.00)
GFR calc Af Amer: >60 mL/min
GFR calc non Af Amer: >60 mL/min
Glucose, Bld: 106 mg/dL — ABNORMAL HIGH (ref 65–99)
Potassium: 3.4 mmol/L — ABNORMAL LOW (ref 3.5–5.1)
Sodium: 138 mmol/L (ref 135–145)

## 2016-04-11 LAB — MAGNESIUM: Magnesium: 2.2 mg/dL (ref 1.7–2.4)

## 2016-04-11 MED ORDER — PANTOPRAZOLE SODIUM 40 MG PO TBEC: 40.0000 mg | DELAYED_RELEASE_TABLET | Freq: Every day | ORAL | 11 refills | Status: DC

## 2016-04-11 MED ORDER — HYDROCHLOROTHIAZIDE 12.5 MG PO CAPS: 12.5000 mg | ORAL_CAPSULE | Freq: Every day | ORAL | 3 refills | Status: DC

## 2016-04-11 MED ORDER — BENZONATATE 100 MG PO CAPS: 100.0000 mg | ORAL_CAPSULE | Freq: Two times a day (BID) | ORAL | 0 refills | Status: DC

## 2016-04-11 NOTE — Progress Notes (Signed)
Name: Katherine Lane    DOB: 1932-06-28  Age: 83 y.o.  MR#: [id number]       PCP:  Lubertha South, MD      Insurance: Payor: Cleatrice Burke MEDICARE / Plan: [facility] MEDICARE / Product Type: *No Product type* /   CC:   No chief complaint on file.   VS Vitals:   04/11/16 1442  BP: (!) 162/78  Pulse: 62  SpO2: 96%  Weight: 148 lb (67.1 kg)  Height: 5\' 3"  (1.6 m)    Weights Current Weight  04/11/16 148 lb (67.1 kg)  03/17/16 138 lb (62.6 kg)  02/22/16 136 lb 2 oz (61.7 kg)    Blood Pressure  BP Readings from Last 3 Encounters:  04/11/16 (!) 162/78  03/17/16 122/74  02/22/16 120/82     Admit date:  (Not on file) Last encounter with RMR:  Visit date not found   Allergy Codeine and Tramadol  Current Outpatient Prescriptions  Medication Sig Dispense Refill  . LORazepam (ATIVAN) 0.5 MG tablet TAKE (1) TABLET DAILY AS NEEDED. 30 tablet 5  . metoprolol succinate (TOPROL-XL) 25 MG 24 hr tablet TAKE ONE TABLET BY MOUTH ONCE DAILY. 90 tablet 3  . polyethylene glycol powder (MIRALAX) powder Take 1 Container by mouth. Every 4-5 days    . pramoxine-hydrocortisone (ANALPRAM-HC) 1-1 % rectal cream Place 1 application rectally 2 (two) times daily. 30 g 1  . UNABLE TO FIND Injections in back    . valsartan (DIOVAN) 80 MG tablet Take 1 tablet (80 mg total) by mouth daily. 30 tablet 1  . verapamil (CALAN-SR) 240 MG CR tablet TAKE (1) TABLET BY MOUTH EACH MORNING. 30 tablet 5  . XARELTO 20 MG TABS tablet TAKE 1 TABLET DAILY WITH SUPPER. 30 tablet 6  . zolpidem (AMBIEN) 10 MG tablet TAKE 1 TABLET BY MOUTH AT BEDTIME FOR SLEEP. 30 tablet 5   No current facility-administered medications for this visit.     Discontinued Meds:   There are no discontinued medications.  Patient Active Problem List   Diagnosis Date Noted  . Altered mental status 12/06/2015  . Hyponatremia 12/06/2015  . Hypokalemia 12/06/2015  . Dehydration 12/06/2015  . Prolonged Q-T interval on ECG 12/06/2015  . Postural  dizziness with presyncope 12/06/2015  . Atrial fibrillation (HCC) 07/14/2013  . Atrial flutter (HCC) 05/12/2013  . Bulging discs 04/20/2013  . Trigeminal neuralgia 01/15/2013  . Cellulitis 07/31/2011  . Mechanical complication of internal orthopedic implant (HCC) 07/21/2011  . 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 Ankle fracture 07/02/2010  . LESION OF PLANTAR NERVE 05/14/2009  . CLOSED BIMALLEOLAR FRACTURE 03/01/2009  . Essential hypertension 11/07/2008  . CONSTIPATION, CHRONIC 11/07/2008  . HEMATOCHEZIA 11/07/2008  . Osteoporosis 11/07/2008    LABS    Component Value Date/Time   NA 129 (L) 12/07/2015 0528   NA 126 (L) 12/06/2015 1109   NA 143 09/24/2015 0837   NA 137 02/26/2015 0725   K 5.2 (H) 12/07/2015 0528   K 2.8 (L) 12/06/2015 1109   K 4.4 09/24/2015 0837   CL 102 12/07/2015 0528   CL 91 (L) 12/06/2015 1109   CL 103 09/24/2015 0837   CO2 22 12/07/2015 0528   CO2 25 12/06/2015 1109   CO2 26 09/24/2015 0837   GLUCOSE 89 12/07/2015 0528   GLUCOSE 128 (H) 12/06/2015 1109   GLUCOSE 86 09/24/2015 0837   GLUCOSE 133 (H) 02/26/2015 0725   BUN 14 12/07/2015 0528   BUN 21 (H) 12/06/2015  1109   BUN 15 09/24/2015 0837   BUN 13 02/26/2015 0725   CREATININE 0.68 12/07/2015 0528   CREATININE 0.87 12/06/2015 1109   CREATININE 0.89 09/24/2015 0837   CREATININE 0.90 10/18/2014 1038   CREATININE 0.96 11/07/2013 0807   CREATININE 0.91 07/14/2012 0740   CALCIUM 8.1 (L) 12/07/2015 0528   CALCIUM 9.0 12/06/2015 1109   CALCIUM 9.4 09/24/2015 0837   GFRNONAA >60 12/07/2015 0528   GFRNONAA >60 12/06/2015 1109   GFRNONAA 61 09/24/2015 0837   GFRAA >60 12/07/2015 0528   GFRAA >60 12/06/2015 1109   GFRAA 71 09/24/2015 0837   CMP     Component Value Date/Time   NA 129 (L) 12/07/2015 0528   NA 143 09/24/2015 0837   K 5.2 (H) 12/07/2015 0528   CL 102 12/07/2015 0528   CO2 22 12/07/2015 0528   GLUCOSE 89 12/07/2015 0528   BUN 14 12/07/2015 0528   BUN 15  09/24/2015 0837   CREATININE 0.68 12/07/2015 0528   CREATININE 0.90 10/18/2014 1038   CALCIUM 8.1 (L) 12/07/2015 0528   PROT 6.6 12/06/2015 1109   PROT 6.5 09/24/2015 0837   ALBUMIN 4.1 12/06/2015 1109   ALBUMIN 4.4 09/24/2015 0837   AST 22 12/06/2015 1109   ALT 20 12/06/2015 1109   ALKPHOS 56 12/06/2015 1109   BILITOT 1.2 12/06/2015 1109   BILITOT 0.6 09/24/2015 0837   GFRNONAA >60 12/07/2015 0528   GFRAA >60 12/07/2015 0528       Component Value Date/Time   WBC 9.7 12/07/2015 0528   WBC 11.9 (H) 12/06/2015 1109   WBC 3.9 09/24/2015 0837   WBC 6.6 02/26/2015 0725   HGB 12.7 12/07/2015 0528   HGB 14.7 12/06/2015 1109   HGB 14.3 02/26/2015 0725   HCT 37.7 12/07/2015 0528   HCT 42.6 12/06/2015 1109   HCT 40.5 09/24/2015 0837   HCT 42.6 02/26/2015 0725   MCV 89.3 12/07/2015 0528   MCV 87.1 12/06/2015 1109   MCV 89 09/24/2015 0837   MCV 89.3 02/26/2015 0725    Lipid Panel     Component Value Date/Time   CHOL 177 09/24/2015 0837   TRIG 133 09/24/2015 0837   HDL 57 09/24/2015 0837   CHOLHDL 3.1 09/24/2015 0837   CHOLHDL 3.1 11/07/2013 0807   VLDL 26 11/07/2013 0807   LDLCALC 93 09/24/2015 0837    ABG    Component Value Date/Time   HCO3 25.8 (H) 01/21/2008 1641   TCO2 27 01/21/2008 1641   O2SAT 73.0 01/21/2008 1641     Lab Results  Component Value Date   TSH 1.845 12/06/2015   BNP (last 3 results) No results for input(s): BNP in the last 8760 hours.  ProBNP (last 3 results) No results for input(s): PROBNP in the last 8760 hours.  Cardiac Panel (last 3 results) No results for input(s): CKTOTAL, CKMB, TROPONINI, RELINDX in the last 72 hours.  Iron/TIBC/Ferritin/ %Sat No results found for: IRON, TIBC, FERRITIN, IRONPCTSAT   EKG Orders placed or performed during the hospital encounter of 12/06/15  . EKG 12-Lead  . EKG 12-Lead  . EKG 12-Lead  . EKG 12-Lead  . EKG     Prior Assessment and Plan Problem List as of 04/11/2016 Reviewed: 02/22/2016 10:10 AM  by Lilyan PuntScott Luking, MD     Cardiovascular and Mediastinum   Essential hypertension   Atrial flutter (HCC)   Atrial fibrillation (HCC)   Postural dizziness with presyncope     Digestive   CONSTIPATION, CHRONIC  HEMATOCHEZIA     Nervous and Auditory   LESION OF PLANTAR NERVE   Superficial peroneal nerve neuropathy   Trigeminal neuralgia   Last Assessment & Plan 01/12/2013 Office Visit Written 01/15/2013  4:28 PM by Campbell Riches, NP    Depo-Medrol 40 mg IM. Start oral prednisone tomorrow. Tramadol as directed for pain. Call back in 48 hours if no improvement, sooner if worse.        Musculoskeletal and Integument   Osteoporosis   CLOSED BIMALLEOLAR FRACTURE   Ankle fracture   Bulging discs     Other   Mechanical complication of internal orthopedic implant (HCC)   Cellulitis   Altered mental status   Hyponatremia   Hypokalemia   Dehydration   Prolonged Q-T interval on ECG       Imaging: No results found.

## 2016-04-11 NOTE — Patient Instructions (Addendum)
Your physician recommends that you schedule a follow-up appointment in: 1 Month with Dr. Purvis SheffieldKoneswaran.   Your physician has recommended you make the following change in your medication:   Start HCTZ 12.5 mg Daily   Protonix 40 mg Daily  Prednisone Dose Pack  Sondra Comeessalon Perls   Your physician recommends that you return for lab work in:  Today   Have Chest X-ray done today   If you need a refill on your cardiac medications before your next appointment, please call your pharmacy.  Thank you for choosing [facility]!

## 2016-04-14 ENCOUNTER — Encounter: Payer: Self-pay | Admitting: Family Medicine

## 2016-04-14 ENCOUNTER — Telehealth: Payer: Self-pay | Admitting: *Deleted

## 2016-04-14 ENCOUNTER — Ambulatory Visit: Payer: Medicare Other | Admitting: Family Medicine

## 2016-04-14 ENCOUNTER — Ambulatory Visit (INDEPENDENT_AMBULATORY_CARE_PROVIDER_SITE_OTHER): Payer: Medicare Other | Admitting: Family Medicine

## 2016-04-14 VITALS — BP 136/90 | Ht 61.5 in | Wt 140.2 lb

## 2016-04-14 DIAGNOSIS — I10 Essential (primary) hypertension: Secondary | ICD-10-CM | POA: Diagnosis not present

## 2016-04-14 DIAGNOSIS — R05 Cough: Secondary | ICD-10-CM | POA: Diagnosis not present

## 2016-04-14 DIAGNOSIS — E876 Hypokalemia: Secondary | ICD-10-CM

## 2016-04-14 DIAGNOSIS — R053 Chronic cough: Secondary | ICD-10-CM

## 2016-04-14 MED ORDER — POTASSIUM CHLORIDE CRYS ER 20 MEQ PO TBCR: 20.0000 meq | EXTENDED_RELEASE_TABLET | Freq: Every day | ORAL | 11 refills | Status: DC

## 2016-04-14 NOTE — Telephone Encounter (Signed)
Called patient with test results. No answer. Left message to call back. Order placed  

## 2016-04-14 NOTE — Telephone Encounter (Signed)
-----   Message from Jodelle GrossKathryn M Lawrence, NP sent at 04/11/2016  4:58 PM EST ----- Potassium is low at 3.4. She will need to start on potassium 20 mEq daily. Awaiting CXR.

## 2016-04-14 NOTE — Progress Notes (Signed)
Subjective:    Patient ID: Katherine Lane, female    DOB: 04/06/1932, 83 y.o.   MRN: [id number]  HPI  Patient in today for a cough. Patient sees cardiologist whom made appointment for cough.Patient notes cough for the past several months. Please see prior notes. Next  Initially seen by her cardiologist with a sinus like infection that then morphed into a chronic cough. Patient reports she has phlegm that comes up at times. She now has had 2 chest x-rays, both of which has known no evidence of pneumonia or excess fluid. Next  Also had blood work just this week which revealed low potassium. Cardiologist started on potassium. Patient is very worried about this along with her daughter. They wonder if this will be rechecked again. No noted this in cardiologist telephone messages on sure they would normally follow-up at one point on it  Patient recently saw a cardiologist because she was concerned that some of her symptoms may be "fluid around my heart".  Patient would also like to discuss low potassium.  Was given new b p meds  Potassium was low on b w  Review of Systems No headache, no major weight loss or weight gain, no chest pain no back pain abdominal pain no change in bowel habits complete ROS otherwise negative     Objective:   Physical Exam  Alert pleasant anxious appearing, HEENT normal slight nasal congestion lungs clear no wheezes heart regular rate and rhythm.      Assessment & Plan:  Impression chronic cough with substantial anxiety on the part of patient and family. Could well be residual respiratory etiology after earlier respiratory illness this winter and absence of infiltrate or recommend no antibiotics. With recent flare of tachyarrhythmias will not recommend inhaler and absence of frank wheezes.. Puzzles family and they have multiple questions which lead me to feel the patient deserves full workup to answer all questions as much as possible #2 hypokalemia. Though  potassium initiated by cardiologist we will do a follow-up met 7. #3 hypertension control improved. We will work on pulmonary referral 

## 2016-04-15 ENCOUNTER — Encounter: Payer: Self-pay | Admitting: Family Medicine

## 2016-04-18 ENCOUNTER — Other Ambulatory Visit: Payer: Self-pay | Admitting: Cardiovascular Disease

## 2016-04-23 LAB — BASIC METABOLIC PANEL
BUN/Creatinine Ratio: 19 (ref 12–28)
BUN: 19 mg/dL (ref 8–27)
CO2: 27 mmol/L (ref 18–29)
Calcium: 9.7 mg/dL (ref 8.7–10.3)
Chloride: 99 mmol/L (ref 96–106)
Creatinine, Ser: 1.02 mg/dL — ABNORMAL HIGH (ref 0.57–1.00)
GFR calc Af Amer: 60 mL/min/{1.73_m2} (ref >59)
GFR calc non Af Amer: 52 mL/min/{1.73_m2} — ABNORMAL LOW (ref >59)
Glucose: 86 mg/dL (ref 65–99)
Potassium: 4.4 mmol/L (ref 3.5–5.2)
Sodium: 144 mmol/L (ref 134–144)

## 2016-05-04 ENCOUNTER — Emergency Department (HOSPITAL_COMMUNITY)
Admission: EM | Admit: 2016-05-04 | Discharge: 2016-05-04 | Disposition: A | Discharge status: Home / Self Care | Payer: Medicare Other | Attending: Emergency Medicine | Admitting: Emergency Medicine

## 2016-05-04 ENCOUNTER — Encounter (HOSPITAL_COMMUNITY): Payer: Self-pay | Admitting: Emergency Medicine

## 2016-05-04 DIAGNOSIS — Z79899 Other long term (current) drug therapy: Secondary | ICD-10-CM | POA: Diagnosis not present

## 2016-05-04 DIAGNOSIS — I10 Essential (primary) hypertension: Secondary | ICD-10-CM | POA: Insufficient documentation

## 2016-05-04 NOTE — ED Notes (Signed)
Pt is alert and oriented. NAD noted. Pt denies any pain.

## 2016-05-04 NOTE — ED Notes (Signed)
ED Provider at bedside. 

## 2016-05-04 NOTE — Discharge Instructions (Signed)
Increase your valsartan (diovan) dosage to 120 mg daily , which is one and one half tablets daily. Check your blood pressure each morning and write down the number. Call Dr.Luking's office tomorrow to schedule appointment to arrange to be seen in 1 week and take your list of blood pressures with you. Please tell Dr. Gerda DissLuking that you had an adjustment of your medications may today. Ask him to go over all of your medicines. Further adjustments may need to be made. Return if concern for any reason, particularly if you develop a faint feeling or lightheadedness.

## 2016-05-04 NOTE — ED Provider Notes (Signed)
[facility] Provider Note   CSN: [id number] Arrival date & time: 05/04/16  1254  By signing my name below, I, Modena Jansky, attest that this documentation has been prepared under the direction and in the presence of Doug Sou, MD. Electronically Signed: Modena Jansky, Scribe. 05/04/2016. 1:22 PM.  History   Chief Complaint Chief Complaint  Patient presents with  . Hypertension   The history is provided by the patient. No language interpreter was used.   HPI Comments: Katherine Lane is a 83 y.o. female with a PMHx of HTN and Afib who presents to the Emergency Department complaining of HTN that started 2 days ago. She recently had a lower back nerve block done prior to 2 days ago. She states she had a Mild self-limiting nosebleed 2 days ago and a BP of 197/124 then. She was seen yesterday and her BP came down to 164/117. She checked her BP in [facility] and had a reading of 205/115, so she came to the ED today. Her BP in the ED today was 165/105. She is currently has no known associated symptoms and feels at baseline. She is currently on Metoprolol Succinate and Xarelto. She denies any chest pain, SOB, or other complaints. .    PCP: Lubertha South, MD  Past Medical History:  Diagnosis Date  . A-fib (HCC)   . Anxiety   . Arthritis   . Chronic back pain   . Constipation, chronic   . Depression   . HTN (hypertension)   . Insomnia   . Migraine headache   . Osteoporosis   . PONV (postoperative nausea and vomiting)   . Trigeminal neuralgia     Patient Active Problem List   Diagnosis Date Noted  . Altered mental status 12/06/2015  . Hyponatremia 12/06/2015  . Hypokalemia 12/06/2015  . Dehydration 12/06/2015  . Prolonged Q-T interval on ECG 12/06/2015  . Postural dizziness with presyncope 12/06/2015  . Atrial fibrillation (HCC) 07/14/2013  . Atrial flutter (HCC) 05/12/2013  . Bulging discs 04/20/2013  . Trigeminal neuralgia 01/15/2013  . Cellulitis 07/31/2011  .  Mechanical complication of internal orthopedic implant (HCC) 07/21/2011  . 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 Ankle fracture 07/02/2010  . LESION OF PLANTAR NERVE 05/14/2009  . CLOSED BIMALLEOLAR FRACTURE 03/01/2009  . Essential hypertension 11/07/2008  . CONSTIPATION, CHRONIC 11/07/2008  . HEMATOCHEZIA 11/07/2008  . Osteoporosis 11/07/2008    Past Surgical History:  Procedure Laterality Date  . ANKLE FRACTURE SURGERY     2010-right  . CARDIAC CATHETERIZATION     denies  . CATARACT EXTRACTION W/PHACO Right 07/22/2012   Procedure: CATARACT EXTRACTION PHACO AND INTRAOCULAR LENS PLACEMENT (IOC);  Surgeon: Gemma Payor, MD;  Location: [facility];  Service: Ophthalmology;  Laterality: Right;  CDE:  18.93  . CATARACT EXTRACTION W/PHACO Left 08/09/2012   Procedure: CATARACT EXTRACTION PHACO AND INTRAOCULAR LENS PLACEMENT (IOC);  Surgeon: Gemma Payor, MD;  Location: [facility];  Service: Ophthalmology;  Laterality: Left;  CDE: 17.21  . COLONOSCOPY    . HARDWARE REMOVAL  07/18/2011   Procedure: HARDWARE REMOVAL;  Surgeon: Vickki Hearing, MD;  Location: [facility];  Service: Orthopedics;  Laterality: Right;  . NERVE REPAIR  07/18/2011   Procedure: NERVE REPAIR;  Surgeon: Vickki Hearing, MD;  Location: [facility];  Service: Orthopedics;  Laterality: Right;  Right leg superficial peroneal nerve release    . PARTIAL HYSTERECTOMY      OB History    Gravida Para Term Preterm AB Living  3 3 3     3    SAB TAB Ectopic Multiple Live Births           3       Home Medications    Prior to Admission medications   Medication Sig Start Date End Date Taking? Authorizing Provider  benzonatate (TESSALON PERLES) 100 MG capsule Take 1 capsule (100 mg total) by mouth 2 (two) times daily. 04/11/16   Jodelle GrossKathryn M Lawrence, NP  hydrochlorothiazide (MICROZIDE) 12.5 MG capsule Take 1 capsule (12.5 mg total) by mouth daily. 04/11/16 07/10/16  Jodelle GrossKathryn M Lawrence, NP  LORazepam (ATIVAN) 0.5 MG tablet TAKE (1)  TABLET DAILY AS NEEDED. 11/07/15   Merlyn AlbertWilliam S Luking, MD  metoprolol succinate (TOPROL-XL) 25 MG 24 hr tablet TAKE ONE TABLET BY MOUTH ONCE DAILY. 04/18/16   Laqueta LindenSuresh A Koneswaran, MD  pantoprazole (PROTONIX) 40 MG tablet Take 1 tablet (40 mg total) by mouth daily. Patient not taking: Reported on 04/14/2016 04/11/16   Jodelle GrossKathryn M Lawrence, NP  polyethylene glycol powder (MIRALAX) powder Take 1 Container by mouth. Every 4-5 days    Historical Provider, MD  potassium chloride SA (K-DUR,KLOR-CON) 20 MEQ tablet Take 1 tablet (20 mEq total) by mouth daily. Patient not taking: Reported on 04/14/2016 04/14/16   Jodelle GrossKathryn M Lawrence, NP  pramoxine-hydrocortisone [employer](ANALPRAM-HC) 1-1 % rectal cream Place 1 application rectally 2 (two) times daily. 01/01/16   Adline PotterJennifer A Griffin, NP  UNABLE TO FIND Injections in back    Historical Provider, MD  valsartan (DIOVAN) 80 MG tablet Take 1 tablet (80 mg total) by mouth daily. 04/08/16   Laqueta LindenSuresh A Koneswaran, MD  verapamil (CALAN-SR) 240 MG CR tablet TAKE (1) TABLET BY MOUTH EACH MORNING. 03/03/16   Merlyn AlbertWilliam S Luking, MD  XARELTO 20 MG TABS tablet TAKE 1 TABLET DAILY WITH SUPPER. 03/20/16   Laqueta LindenSuresh A Koneswaran, MD  zolpidem (AMBIEN) 10 MG tablet TAKE 1 TABLET BY MOUTH AT BEDTIME FOR SLEEP. 04/01/16   Merlyn AlbertWilliam S Luking, MD    Family History Family History  Problem Relation Age of Onset  . Hypertension Mother   . Alzheimer's disease Mother   . Cancer Mother   . Hypertension Father   . Other Father     abused pain meds  . Diabetes Brother   . Other Brother     back problems  . Diabetes Daughter   . Anesthesia problems Neg Hx   . Hypotension Neg Hx   . Malignant hyperthermia Neg Hx   . Pseudochol deficiency Neg Hx     Social History Social History  Substance Use Topics  . Smoking status: Never Smoker  . Smokeless tobacco: Never Used  . Alcohol use No     Allergies   Codeine and Tramadol   Review of Systems Review of Systems  Constitutional: Negative.   HENT:  Negative.   Respiratory: Negative.  Negative for shortness of breath.   Cardiovascular: Negative.  Negative for chest pain.  Gastrointestinal: Negative.   Musculoskeletal: Negative.   Skin: Negative.   Neurological: Negative.   Hematological: Bruises/bleeds easily.  Psychiatric/Behavioral: Negative.   All other systems reviewed and are negative.    Physical Exam Updated Vital Signs BP (!) 165/105 (BP Location: Left Arm)   Pulse 68   Temp 97.8 F (36.6 C) (Oral)   Resp 18   Ht 5\' 3"  (1.6 m)   Wt 139 lb (63 kg)   SpO2 97%   BMI 24.62 kg/m   Physical Exam  Constitutional: She is  oriented to person, place, and time. She appears well-developed and well-nourished.  HENT:  Head: Normocephalic and atraumatic.  Eyes: Conjunctivae are normal. Pupils are equal, round, and reactive to light.  Neck: Neck supple. No tracheal deviation present. No thyromegaly present.  Cardiovascular: Normal rate.   No murmur heard. Irregularly irregular  Pulmonary/Chest: Effort normal and breath sounds normal.  Abdominal: Soft. Bowel sounds are normal. She exhibits no distension. There is no tenderness.  Musculoskeletal: Normal range of motion. She exhibits no edema or tenderness.  Neurological: She is alert and oriented to person, place, and time. Coordination normal.  Skin: Skin is warm and dry. No rash noted.  Psychiatric: She has a normal mood and affect.  Nursing note and vitals reviewed.    ED Treatments / Results  DIAGNOSTIC STUDIES: Oxygen Saturation is 97% on RA, normal by my interpretation.    COORDINATION OF CARE: 1:26 PM- Pt advised of plan for treatment and pt agrees.  Labs (all labs ordered are listed, but only abnormal results are displayed) Labs Reviewed - No data to display  EKG  EKG Interpretation None       Radiology No results found.  Procedures Procedures (including critical care time)  Medications Ordered in ED Medications - No data to display   Initial  Impression / Assessment and Plan / ED Course  I have reviewed the triage vital signs and the nursing notes.  Pertinent labs & imaging results that were available during my care of the patient were reviewed by me and considered in my medical decision making (see chart for details).     Patient is well-appearing and asymptomatic. After discussion with the hospital pharmacist we will increase valsartan to 120 mEq daily. She is instructed get to get her blood pressure rechecked in a week and her primary care physician's office. No signs of hypertensive emergency.  Final Clinical Impressions(s) / ED Diagnoses  Diagnosis hypertension Final diagnoses:  None    New Prescriptions New Prescriptions   No medications on file        Doug Sou, MD 05/04/16 1353

## 2016-05-04 NOTE — ED Triage Notes (Signed)
Patient c/o hypertension. Per patient has had steroid injections in back on Tuesday which elevates her blood pressure-patient had nose bleed on Friday night and checked blood pressure 197/124. [place]no new medications given. Patient rechecked blood pressure today at pharmacy 20/115 and 223/131. Patient denies any symptoms but was instructed to go to ER by pharmacist. Patient takes Toprol XL and verapamil for blood pressure. Patient has a-fib in which she stays per family.

## 2016-05-05 ENCOUNTER — Encounter: Payer: Self-pay | Admitting: Family Medicine

## 2016-05-05 ENCOUNTER — Ambulatory Visit (INDEPENDENT_AMBULATORY_CARE_PROVIDER_SITE_OTHER): Payer: Medicare Other | Admitting: Family Medicine

## 2016-05-05 VITALS — BP 150/90 | Ht 61.5 in | Wt 138.0 lb

## 2016-05-05 DIAGNOSIS — I10 Essential (primary) hypertension: Secondary | ICD-10-CM | POA: Diagnosis not present

## 2016-05-05 MED ORDER — VALSARTAN 160 MG PO TABS: 160.0000 mg | ORAL_TABLET | Freq: Every day | ORAL | 5 refills | Status: DC

## 2016-05-05 NOTE — Progress Notes (Signed)
Subjective:     Patient ID: Katherine Lane, female   DOB: 07/13/1932, 83 y.o.   MRN: [id number]  HPI Had back injections last w  Had f u bp was sig elevated, 197 ovdr 74  Felt some blurred vision and nose bleed  [place]  BP had come down some 155 over 100  Pt had EKG  Was advised in the er not to take stronger med casue bp could drop some  Ace inhib stopped two weeks ago  164 over 115  On ssturay Sun day ws seen in the pharmacy 205 over 115   223 ovr 131 in other arm  Went to the ER   BP 129 ovr 77 today     Review of Systems No headache, no major weight loss or weight gain, no chest pain no back pain abdominal pain no change in bowel habits complete ROS otherwise negative     Objective:   Physical Exam 136/74 right arm similar findings left arm similar findings supine alert oriented anxious no major distress. Lungs clear. Heart regular rate and rhythm.    Assessment:     Impression hypertension long discussion held. Patient's cardiologist stopped the enalapril and initiated valsartan a couple weeks ago. The patient reinitiated enalapril on Saturday at the encouragement of the ER doctor. Of Katherine Lane. Then the patient saw the ER doctor here on Sunday and increase valsartan. However she went ahead and took another enalapril last night.  We will stop the enalapril this time. Moved to valsartan to 1 460 mg tablet. She will maintain her other medications. She will use the metoprolol and hydrochlorothiazide and potassium in the morning and the verapamil in the evening. Patient feels free to call us. Numerous questions answered. Since and number these medications are being adjusted by the cardiologist office, encourage patient to stick with one clinician such as the cardiologist who is managing many of these medicines anyway WSL.25  Greater than 50% of this 25 minute face to face visit was spent in counseling and discussion and coordination of care  regarding the above diagnosis/diagnosies      Plan:

## 2016-05-09 ENCOUNTER — Encounter: Payer: Self-pay | Admitting: Cardiovascular Disease

## 2016-05-09 ENCOUNTER — Ambulatory Visit (INDEPENDENT_AMBULATORY_CARE_PROVIDER_SITE_OTHER): Payer: Medicare Other | Admitting: Cardiovascular Disease

## 2016-05-09 VITALS — BP 180/84 | HR 82 | Ht 63.0 in | Wt 137.0 lb

## 2016-05-09 DIAGNOSIS — I480 Paroxysmal atrial fibrillation: Secondary | ICD-10-CM

## 2016-05-09 DIAGNOSIS — I10 Essential (primary) hypertension: Secondary | ICD-10-CM | POA: Diagnosis not present

## 2016-05-09 DIAGNOSIS — F419 Anxiety disorder, unspecified: Secondary | ICD-10-CM | POA: Diagnosis not present

## 2016-05-09 DIAGNOSIS — Z9289 Personal history of other medical treatment: Secondary | ICD-10-CM | POA: Diagnosis not present

## 2016-05-09 NOTE — Progress Notes (Signed)
SUBJECTIVE: [name] returns for routine follow-up. She has been having problems with hypertension recently. ACE inhibitor was stopped within the past few weeks. Blood pressures have been as high as 223/131. She has been evaluated in the ED on 3/18.   She is taking valsartan, metoprolol, hydrochlorothiazide, and verapamil.  However, she has been instructed to take hydrochlorothiazide and Toprol-XL and the morning by her PCP, and valsartan and verapamil in the afternoon.  GFR 52 mL/min on 04/22/16.  She has a history of anxiety. She also has paroxysmal atrial fibrillation and benign positional vertigo.  She tells me that family stressors have made her more anxious over the past few months. Her daughters are very involved with her life and call her frequently as they're concerned about her. This has led to a lot of internal frustration. She is tearful when talking about it. She has had more frequent palpitations lately. She denies chest pain. She said she is confused about her medications and her blood pressure. She thinks that her reading today, 180/84, is normal.  [place] she had a nosebleed and went to a pharmacist to have her blood pressure checked and it was 197/124. She then went to the ED and blood pressure in her right arm was 164/117 and 223/131 and her left arm.     Review of Systems: As per "subjective", otherwise negative.  Allergies  Allergen Reactions  . Codeine Nausea And Vomiting    Patient states intolerance to all pain medications  . Tramadol     Current Outpatient Prescriptions  Medication Sig Dispense Refill  . hydrochlorothiazide (MICROZIDE) 12.5 MG capsule Take 1 capsule (12.5 mg total) by mouth daily. 90 capsule 3  . LORazepam (ATIVAN) 0.5 MG tablet TAKE (1) TABLET DAILY AS NEEDED. 30 tablet 5  . metoprolol succinate (TOPROL-XL) 25 MG 24 hr tablet TAKE ONE TABLET BY MOUTH ONCE DAILY. 90 tablet 0  . polyethylene glycol  powder (MIRALAX) powder Take 1 Container by mouth. Every 4-5 days    . potassium chloride SA (K-DUR,KLOR-CON) 20 MEQ tablet Take 1 tablet (20 mEq total) by mouth daily. 30 tablet 11  . pramoxine-hydrocortisone (ANALPRAM-HC) 1-1 % rectal cream Place 1 application rectally 2 (two) times daily. 30 g 1  . valsartan (DIOVAN) 160 MG tablet Take 1 tablet (160 mg total) by mouth daily. 30 tablet 5  . verapamil (CALAN-SR) 240 MG CR tablet TAKE (1) TABLET BY MOUTH EACH MORNING. 30 tablet 5  . XARELTO 20 MG TABS tablet TAKE 1 TABLET DAILY WITH SUPPER. 30 tablet 6  . zolpidem (AMBIEN) 10 MG tablet TAKE 1 TABLET BY MOUTH AT BEDTIME FOR SLEEP. 30 tablet 5   No current facility-administered medications for this visit.     Past Medical History:  Diagnosis Date  . A-fib (HCC)   . Anxiety   . Arthritis   . Chronic back pain   . Constipation, chronic   . Depression   . HTN (hypertension)   . Insomnia   . Migraine headache   . Osteoporosis   . PONV (postoperative nausea and vomiting)   . Trigeminal neuralgia     Past Surgical History:  Procedure Laterality Date  . ANKLE FRACTURE SURGERY     2010-right  . CARDIAC CATHETERIZATION     denies  . CATARACT EXTRACTION W/PHACO Right 07/22/2012   Procedure: CATARACT EXTRACTION PHACO AND INTRAOCULAR LENS PLACEMENT (IOC);  Surgeon: Gemma PayorKerry Hunt, MD;  Location: [facility];  Service: Ophthalmology;  Laterality: Right;  CDE:  18.93  . CATARACT EXTRACTION W/PHACO Left 08/09/2012   Procedure: CATARACT EXTRACTION PHACO AND INTRAOCULAR LENS PLACEMENT (IOC);  Surgeon: Gemma Payor, MD;  Location: [facility];  Service: Ophthalmology;  Laterality: Left;  CDE: 17.21  . COLONOSCOPY    . HARDWARE REMOVAL  07/18/2011   Procedure: HARDWARE REMOVAL;  Surgeon: Vickki Hearing, MD;  Location: [facility];  Service: Orthopedics;  Laterality: Right;  . NERVE REPAIR  07/18/2011   Procedure: NERVE REPAIR;  Surgeon: Vickki Hearing, MD;  Location: [facility];  Service: Orthopedics;  Laterality:  Right;  Right leg superficial peroneal nerve release    . PARTIAL HYSTERECTOMY      Social History   Social History  . Marital status: Widowed    Spouse name: N/A  . Number of children: N/A  . Years of education: N/A   Occupational History  . retired Retired   Social History Main Topics  . Smoking status: Never Smoker  . Smokeless tobacco: Never Used  . Alcohol use No  . Drug use: No  . Sexual activity: Not Currently    Birth control/ protection: Surgical     Comment: hyst   Other Topics Concern  . Not on file   Social History Narrative  . No narrative on file     Vitals:   05/09/16 1104  BP: (!) 180/84  Pulse: 82  SpO2: 97%  Weight: 137 lb (62.1 kg)  Height: 5\' 3"  (1.6 m)    PHYSICAL EXAM General: NAD HEENT: Normal. Neck: No JVD, no thyromegaly. Lungs: Clear to auscultation bilaterally with normal respiratory effort. CV: Nondisplaced PMI.  Regular rate and irregular rhythm, normal S1/S2, no S3, no murmur. No pretibial or periankle edema.     Abdomen: Soft, nontender, no distention.  Neurologic: Alert and oriented.  Psych: Anxious and tearful at times. Skin: Normal. Musculoskeletal: No gross deformities.    ECG: Most recent ECG reviewed.      ASSESSMENT AND PLAN:  1. Paroxysmal atrial fibrillation: Symptomatically stable on verapamil and metoprolol succinate, but will aim to control BP. Continue Xarelto.   2. Malignant HTN: Markedly elevated. Currently taking hydrochlorothiazide 12.5 mg daily (q am), Toprol-XL 25 mg daily (q am), valsartan 160 mg daily (q pm), and long-acting verapamil 240 mg daily (q pm). I have asked the patient to check blood pressure readings 4-5 times per week, at different times throughout the day, in order to get a better approximation of mean BP values. These results will be provided to me at the end of that period so that I can determine if antihypertensive medication titration is indicated.  3. Anxiety: She has prn  Ativan. She may require something on a daily basis. I will defer to PCP regarding this.   Dispo: fu 3 months.  Time spent: 40 minutes, of which greater than 50% was spent reviewing symptoms, relevant blood tests and studies, and discussing management plan with the patient.   Prentice Docker, M.D., F.A.C.C.

## 2016-05-09 NOTE — Patient Instructions (Signed)
Your physician recommends that you schedule a follow-up appointment in: 3 months with Dr Purvis SheffieldKoneswaran    Take blood pressure every OTHER day, morning, afternoon , and bedtime for the next 2 weeks, Then drop off readings for Dr Purvis SheffieldKoneswaran to review       No change in medications at this point         Thank you for choosing [facility] !

## 2016-05-26 ENCOUNTER — Institutional Professional Consult (permissible substitution): Payer: Medicare Other | Admitting: Pulmonary Disease

## 2016-05-30 ENCOUNTER — Other Ambulatory Visit: Payer: Self-pay | Admitting: Family Medicine

## 2016-05-30 NOTE — Telephone Encounter (Signed)
May have this +2 refills 

## 2016-06-10 ENCOUNTER — Telehealth: Payer: Self-pay

## 2016-06-10 NOTE — Telephone Encounter (Signed)
Patient notified that BP recordings looked good per Dr Purvis Sheffield, no change in treatment

## 2016-06-17 DIAGNOSIS — J811 Chronic pulmonary edema: Secondary | ICD-10-CM

## 2016-06-17 HISTORY — DX: Chronic pulmonary edema: J81.1

## 2016-06-20 ENCOUNTER — Ambulatory Visit (INDEPENDENT_AMBULATORY_CARE_PROVIDER_SITE_OTHER): Payer: Medicare Other | Admitting: Adult Health

## 2016-06-20 ENCOUNTER — Encounter: Payer: Self-pay | Admitting: Women's Health

## 2016-06-20 VITALS — BP 158/82 | HR 68 | Ht 61.0 in | Wt 141.5 lb

## 2016-06-20 DIAGNOSIS — N390 Urinary tract infection, site not specified: Secondary | ICD-10-CM

## 2016-06-20 DIAGNOSIS — R319 Hematuria, unspecified: Secondary | ICD-10-CM

## 2016-06-20 DIAGNOSIS — R300 Dysuria: Secondary | ICD-10-CM

## 2016-06-20 DIAGNOSIS — B369 Superficial mycosis, unspecified: Secondary | ICD-10-CM | POA: Diagnosis not present

## 2016-06-20 DIAGNOSIS — L719 Rosacea, unspecified: Secondary | ICD-10-CM

## 2016-06-20 LAB — POCT URINALYSIS DIPSTICK
Glucose, UA: NEGATIVE
Ketones, UA: NEGATIVE
Nitrite, UA: NEGATIVE
Protein, UA: NEGATIVE

## 2016-06-20 MED ORDER — NYSTATIN 100000 UNIT/GM EX POWD: Freq: Four times a day (QID) | CUTANEOUS | 1 refills | Status: DC

## 2016-06-20 MED ORDER — FLUCONAZOLE 150 MG PO TABS: ORAL_TABLET | ORAL | 1 refills | Status: DC

## 2016-06-20 MED ORDER — SULFAMETHOXAZOLE-TRIMETHOPRIM 800-160 MG PO TABS: 1.0000 | ORAL_TABLET | Freq: Two times a day (BID) | ORAL | 0 refills | Status: DC

## 2016-06-20 NOTE — Progress Notes (Signed)
Subjective:     Patient ID: Katherine RicksBetty M Lane, female   DOB: 08-Jan-1933, 83 y.o.   MRN: [id number]  HPI Katherine Lane is a 83 year old widowed female in complaining of burning with urination and pressure and rash on face and under breasts and on stomach.  PCP is [employer] Luking  Review of Systems +burning with urination and pressure Rash  Reviewed past medical,surgical, social and family history. Reviewed medications and allergies.     Objective:   Physical Exam BP (!) 158/82 (BP Location: Left Arm, Patient Position: Sitting, Cuff Size: Normal)   Pulse 68   Ht 5\' 1"  (1.549 m)   Wt 141 lb 8 oz (64.2 kg)   BMI 26.74 kg/m urine dipstick 2+leuks and trace blood,NO CVAT, pressure over bladder, has skin fungus under both breasts and panniculus.And looks like rosacea on cheeks.    Assessment:        1. Burning with urination   2. Urinary tract infection with hematuria, site unspecified   3. Superficial fungus infection of skin   4. Rosacea    Plan:    UA C&S sent Meds ordered this encounter  Medications  . sulfamethoxazole-trimethoprim (BACTRIM DS,SEPTRA DS) 800-160 MG tablet    Sig: Take 1 tablet by mouth 2 (two) times daily.    Dispense:  14 tablet    Refill:  0    Order Specific Question:   Supervising Provider    Answer:   Despina HiddenEURE, LUTHER H [[id number]]  . nystatin (MYCOSTATIN/NYSTOP) powder    Sig: Apply topically 4 (four) times daily.    Dispense:  30 g    Refill:  1    Order Specific Question:   Supervising Provider    Answer:   EURE, LUTHER H [[id number]]  . fluconazole (DIFLUCAN) 150 MG tablet    Sig: Take 1 now and 1 in 3 days if needed    Dispense:  2 tablet    Refill:  1    Order Specific Question:   Supervising Provider    Answer:   Duane LopeEURE, LUTHER H [[id number]]  Follow up in 2 weeks  Push fluids

## 2016-06-21 LAB — URINALYSIS, ROUTINE W REFLEX MICROSCOPIC
Bilirubin, UA: NEGATIVE
Glucose, UA: NEGATIVE
Ketones, UA: NEGATIVE
Nitrite, UA: NEGATIVE
Protein, UA: NEGATIVE
RBC, UA: NEGATIVE
Specific Gravity, UA: 1.018 (ref 1.005–1.030)
Urobilinogen, Ur: 0.2 mg/dL (ref 0.2–1.0)
pH, UA: 7.5 (ref 5.0–7.5)

## 2016-06-21 LAB — MICROSCOPIC EXAMINATION
Casts: NONE SEEN /lpf
Epithelial Cells (non renal): >10 /hpf — AB (ref 0–10)

## 2016-06-22 LAB — URINE CULTURE

## 2016-06-30 ENCOUNTER — Emergency Department (HOSPITAL_COMMUNITY): Payer: Medicare Other

## 2016-06-30 ENCOUNTER — Other Ambulatory Visit: Payer: Self-pay | Admitting: Adult Health

## 2016-06-30 ENCOUNTER — Observation Stay (HOSPITAL_COMMUNITY)
Admission: EM | Admit: 2016-06-30 | Discharge: 2016-07-02 | Disposition: A | Discharge status: Home / Self Care | Payer: Medicare Other | Attending: Family Medicine | Admitting: Family Medicine

## 2016-06-30 ENCOUNTER — Encounter (HOSPITAL_COMMUNITY): Payer: Self-pay | Admitting: Emergency Medicine

## 2016-06-30 DIAGNOSIS — G500 Trigeminal neuralgia: Secondary | ICD-10-CM | POA: Diagnosis present

## 2016-06-30 DIAGNOSIS — K5909 Other constipation: Secondary | ICD-10-CM | POA: Diagnosis present

## 2016-06-30 DIAGNOSIS — E871 Hypo-osmolality and hyponatremia: Secondary | ICD-10-CM | POA: Diagnosis not present

## 2016-06-30 DIAGNOSIS — F419 Anxiety disorder, unspecified: Secondary | ICD-10-CM | POA: Diagnosis present

## 2016-06-30 DIAGNOSIS — H538 Other visual disturbances: Secondary | ICD-10-CM | POA: Diagnosis present

## 2016-06-30 DIAGNOSIS — I10 Essential (primary) hypertension: Secondary | ICD-10-CM | POA: Diagnosis not present

## 2016-06-30 DIAGNOSIS — Z5181 Encounter for therapeutic drug level monitoring: Secondary | ICD-10-CM | POA: Diagnosis not present

## 2016-06-30 DIAGNOSIS — I480 Paroxysmal atrial fibrillation: Secondary | ICD-10-CM | POA: Diagnosis present

## 2016-06-30 DIAGNOSIS — Z79899 Other long term (current) drug therapy: Secondary | ICD-10-CM | POA: Insufficient documentation

## 2016-06-30 DIAGNOSIS — G43909 Migraine, unspecified, not intractable, without status migrainosus: Secondary | ICD-10-CM | POA: Diagnosis present

## 2016-06-30 LAB — COMPREHENSIVE METABOLIC PANEL
ALT: 16 U/L (ref 14–54)
AST: 26 U/L (ref 15–41)
Albumin: 4.3 g/dL (ref 3.5–5.0)
Alkaline Phosphatase: 66 U/L (ref 38–126)
Anion gap: 9 (ref 5–15)
BUN: 14 mg/dL (ref 6–20)
CO2: 21 mmol/L — ABNORMAL LOW (ref 22–32)
Calcium: 9 mg/dL (ref 8.9–10.3)
Chloride: 94 mmol/L — ABNORMAL LOW (ref 101–111)
Creatinine, Ser: 0.96 mg/dL (ref 0.44–1.00)
GFR calc Af Amer: >60 mL/min (ref >60)
GFR calc non Af Amer: 54 mL/min — ABNORMAL LOW (ref >60)
Glucose, Bld: 110 mg/dL — ABNORMAL HIGH (ref 65–99)
Potassium: 4.6 mmol/L (ref 3.5–5.1)
Sodium: 124 mmol/L — ABNORMAL LOW (ref 135–145)
Total Bilirubin: 0.8 mg/dL (ref 0.3–1.2)
Total Protein: 7 g/dL (ref 6.5–8.1)

## 2016-06-30 LAB — TROPONIN I: Troponin I: <0.03 ng/mL (ref <0.03)

## 2016-06-30 LAB — URINALYSIS, ROUTINE W REFLEX MICROSCOPIC
Bilirubin Urine: NEGATIVE
Glucose, UA: NEGATIVE mg/dL
Ketones, ur: NEGATIVE mg/dL
Nitrite: NEGATIVE
Protein, ur: NEGATIVE mg/dL
Specific Gravity, Urine: 1.013 (ref 1.005–1.030)
pH: 5 (ref 5.0–8.0)

## 2016-06-30 LAB — CBC WITH DIFFERENTIAL/PLATELET
Basophils Absolute: 0 10*3/uL (ref 0.0–0.1)
Basophils Relative: 0 %
Eosinophils Absolute: 0.1 10*3/uL (ref 0.0–0.7)
Eosinophils Relative: 1 %
HCT: 37.5 % (ref 36.0–46.0)
Hemoglobin: 13.3 g/dL (ref 12.0–15.0)
Lymphocytes Relative: 47 %
Lymphs Abs: 2.4 10*3/uL (ref 0.7–4.0)
MCH: 30.6 pg (ref 26.0–34.0)
MCHC: 35.5 g/dL (ref 30.0–36.0)
MCV: 86.4 fL (ref 78.0–100.0)
Monocytes Absolute: 0.4 10*3/uL (ref 0.1–1.0)
Monocytes Relative: 8 %
Neutro Abs: 2.2 10*3/uL (ref 1.7–7.7)
Neutrophils Relative %: 44 %
Platelets: 291 10*3/uL (ref 150–400)
RBC: 4.34 MIL/uL (ref 3.87–5.11)
RDW: 13.6 % (ref 11.5–15.5)
WBC: 5.1 10*3/uL (ref 4.0–10.5)

## 2016-06-30 LAB — POC OCCULT BLOOD, ED: Fecal Occult Bld: NEGATIVE

## 2016-06-30 LAB — PROTIME-INR
INR: 3.25
Prothrombin Time: 33.9 seconds — ABNORMAL HIGH (ref 11.4–15.2)

## 2016-06-30 LAB — RAPID URINE DRUG SCREEN, HOSP PERFORMED
Amphetamines: NOT DETECTED
Barbiturates: NOT DETECTED
Benzodiazepines: NOT DETECTED
Cocaine: NOT DETECTED
Opiates: NOT DETECTED
Tetrahydrocannabinol: NOT DETECTED

## 2016-06-30 LAB — ETHANOL: Alcohol, Ethyl (B): <5 mg/dL (ref <5)

## 2016-06-30 LAB — APTT: aPTT: 34 seconds (ref 24–36)

## 2016-06-30 MED ORDER — ONDANSETRON HCL 4 MG/2ML IJ SOLN
4.0000 mg | Freq: Once | INTRAMUSCULAR | Status: AC
  Administered 2016-06-30: 4 mg via INTRAVENOUS
  Filled 2016-06-30: qty 2

## 2016-06-30 MED ORDER — LORAZEPAM 2 MG/ML IJ SOLN
0.5000 mg | Freq: Once | INTRAMUSCULAR | Status: AC
  Administered 2016-07-01: 0.5 mg via INTRAVENOUS
  Filled 2016-06-30: qty 1

## 2016-06-30 MED ORDER — SODIUM CHLORIDE 0.9 % IV BOLUS (SEPSIS)
500.0000 mL | Freq: Once | INTRAVENOUS | Status: AC
  Administered 2016-06-30: 500 mL via INTRAVENOUS

## 2016-06-30 MED ORDER — MECLIZINE HCL 12.5 MG PO TABS
25.0000 mg | ORAL_TABLET | Freq: Once | ORAL | Status: AC
  Administered 2016-06-30: 25 mg via ORAL
  Filled 2016-06-30: qty 2

## 2016-06-30 NOTE — ED Provider Notes (Signed)
[facility] Provider Note   CSN: [id number] Arrival date & time: 06/30/16  2103 By signing my name below, I, Levon Hedger, attest that this documentation has been prepared under the direction and in the presence of Bethann Berkshire, MD . Electronically Signed: Levon Hedger, Scribe. 06/30/2016. 10:26 PM.   History   Chief Complaint Chief Complaint  Patient presents with  . Blurred Vision   HPI Katherine Lane is a 83 y.o. female, with a history of A-fib on Xarelto, anxiety, chronic constipation, HTN and migraine headaches, who presents to the Emergency Department complaining of who presents to the Emergency Department complaining of gradually worsening, constant peripheral  visual disturbance onset tonight at 7 pm. She expresses that she can't describe the sensation, but that her vision "feels off". Pt states she has experienced similar visual disturbances in the past during migraine headaches. Pt and family member reports associated nausea onset today, as well as rectal pain and constipation onset a few days ago. Pt states she took Miralax two days ago with minimal relief. She denies any blurred vision, vomiting, headache, or eye pain.   The history is provided by the patient. No language interpreter was used.  Eye Problem   This is a new problem. The current episode started 3 to 5 hours ago. The problem has been gradually worsening. There is a problem in both eyes. There was no injury mechanism. The pain is at a severity of 0/10. The patient is experiencing no pain. There is no history of trauma to the eye. There is no known exposure to pink eye. Associated symptoms include decreased vision and nausea. Pertinent negatives include no blurred vision, no discharge and no vomiting. She has tried nothing for the symptoms. The treatment provided no relief.    Past Medical History:  Diagnosis Date  . A-fib (HCC)   . Anxiety   . Arthritis   . Chronic back pain   . Constipation, chronic     . Depression   . HTN (hypertension)   . Insomnia   . Migraine headache   . Osteoporosis   . PONV (postoperative nausea and vomiting)   . Trigeminal neuralgia     Patient Active Problem List   Diagnosis Date Noted  . Altered mental status 12/06/2015  . Hyponatremia 12/06/2015  . Hypokalemia 12/06/2015  . Dehydration 12/06/2015  . Prolonged Q-T interval on ECG 12/06/2015  . Postural dizziness with presyncope 12/06/2015  . Atrial fibrillation (HCC) 07/14/2013  . Atrial flutter (HCC) 05/12/2013  . Bulging discs 04/20/2013  . Trigeminal neuralgia 01/15/2013  . Cellulitis 07/31/2011  . Mechanical complication of internal orthopedic implant (HCC) 07/21/2011  . 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 Ankle fracture 07/02/2010  . LESION OF PLANTAR NERVE 05/14/2009  . CLOSED BIMALLEOLAR FRACTURE 03/01/2009  . Essential hypertension 11/07/2008  . CONSTIPATION, CHRONIC 11/07/2008  . HEMATOCHEZIA 11/07/2008  . Osteoporosis 11/07/2008    Past Surgical History:  Procedure Laterality Date  . ANKLE FRACTURE SURGERY     2010-right  . CARDIAC CATHETERIZATION     denies  . CATARACT EXTRACTION W/PHACO Right 07/22/2012   Procedure: CATARACT EXTRACTION PHACO AND INTRAOCULAR LENS PLACEMENT (IOC);  Surgeon: Gemma Payor, MD;  Location: [facility];  Service: Ophthalmology;  Laterality: Right;  CDE:  18.93  . CATARACT EXTRACTION W/PHACO Left 08/09/2012   Procedure: CATARACT EXTRACTION PHACO AND INTRAOCULAR LENS PLACEMENT (IOC);  Surgeon: Gemma Payor, MD;  Location: [facility];  Service: Ophthalmology;  Laterality: Left;  CDE: 17.21  .  COLONOSCOPY    . HARDWARE REMOVAL  07/18/2011   Procedure: HARDWARE REMOVAL;  Surgeon: Vickki Hearing, MD;  Location: [facility];  Service: Orthopedics;  Laterality: Right;  . NERVE REPAIR  07/18/2011   Procedure: NERVE REPAIR;  Surgeon: Vickki Hearing, MD;  Location: [facility];  Service: Orthopedics;  Laterality: Right;  Right leg superficial peroneal nerve release     . PARTIAL HYSTERECTOMY      OB History    Gravida Para Term Preterm AB Living   3 3 3     3    SAB TAB Ectopic Multiple Live Births           3       Home Medications    Prior to Admission medications   Medication Sig Start Date End Date Taking? Authorizing Provider  hydrochlorothiazide (MICROZIDE) 12.5 MG capsule Take 1 capsule (12.5 mg total) by mouth daily. 04/11/16 07/10/16 Yes Jodelle Gross, NP  LORazepam (ATIVAN) 0.5 MG tablet TAKE ONE TABLET DAILY AS NEEDED. 05/30/16  Yes Luking, Jonna Coup, MD  metoprolol succinate (TOPROL-XL) 25 MG 24 hr tablet TAKE ONE TABLET BY MOUTH ONCE DAILY. Patient taking differently: take one tablet by mouth once daily 04/18/16  Yes Laqueta Linden, MD  nystatin (MYCOSTATIN/NYSTOP) powder Apply topically 4 (four) times daily. 06/20/16  Yes Cyril Mourning A, NP  ondansetron (ZOFRAN-ODT) 4 MG disintegrating tablet Take 4 mg by mouth once.   Yes [provider]  polyethylene glycol powder (MIRALAX) powder Take 17 g by mouth daily.    Yes [provider]  potassium chloride SA (K-DUR,KLOR-CON) 20 MEQ tablet Take 1 tablet (20 mEq total) by mouth daily. 04/14/16  Yes Jodelle Gross, NP  valsartan (DIOVAN) 160 MG tablet Take 1 tablet (160 mg total) by mouth daily. 05/05/16  Yes Merlyn Albert, MD  verapamil (CALAN-SR) 240 MG CR tablet TAKE (1) TABLET BY MOUTH EACH MORNING. 03/03/16  Yes Merlyn Albert, MD  XARELTO 20 MG TABS tablet TAKE 1 TABLET DAILY WITH SUPPER. 03/20/16  Yes Laqueta Linden, MD  zolpidem (AMBIEN) 10 MG tablet TAKE 1 TABLET BY MOUTH AT BEDTIME FOR SLEEP. 04/01/16  Yes Merlyn Albert, MD  fluconazole (DIFLUCAN) 150 MG tablet Take 1 now and 1 in 3 days if needed Patient not taking: Reported on 06/30/2016 06/20/16   Adline Potter, NP  pramoxine-hydrocortisone [facility]) 1-1 % rectal cream Place 1 application rectally 2 (two) times daily. Patient taking differently: Place 1 application rectally 2 (two)  times daily as needed for hemorrhoids or itching.  01/01/16   Adline Potter, NP  sulfamethoxazole-trimethoprim (BACTRIM DS,SEPTRA DS) 800-160 MG tablet Take 1 tablet by mouth 2 (two) times daily. Patient not taking: Reported on 06/30/2016 06/20/16   Adline Potter, NP    Family History Family History  Problem Relation Age of Onset  . Hypertension Mother   . Alzheimer's disease Mother   . Cancer Mother   . Hypertension Father   . Other Father        abused pain meds  . Diabetes Brother   . Other Brother        back problems  . Diabetes Daughter   . Anesthesia problems Neg Hx   . Hypotension Neg Hx   . Malignant hyperthermia Neg Hx   . Pseudochol deficiency Neg Hx     Social History Social History  Substance Use Topics  . Smoking status: Never Smoker  . Smokeless tobacco: Never Used  .  Alcohol use No    Allergies   Codeine and Tramadol  Review of Systems Review of Systems  Constitutional: Negative for appetite change and fatigue.  HENT: Negative for congestion, ear discharge and sinus pressure.   Eyes: Positive for visual disturbance. Negative for blurred vision, pain and discharge.  Respiratory: Negative for cough.   Cardiovascular: Negative for chest pain.  Gastrointestinal: Positive for constipation, nausea and rectal pain. Negative for abdominal pain, diarrhea and vomiting.  Genitourinary: Negative for frequency and hematuria.  Musculoskeletal: Negative for back pain.  Skin: Negative for rash.  Neurological: Negative for seizures and headaches.  Psychiatric/Behavioral: Negative for hallucinations.    Physical Exam Updated Vital Signs BP (!) 179/78   Pulse 84   Temp 98 F (36.7 C) (Oral)   Resp 17   Ht 5\' 1"  (1.549 m)   Wt 139 lb (63 kg)   SpO2 98%   BMI 26.26 kg/m   Physical Exam  Constitutional: She is oriented to person, place, and time. She appears well-developed.  HENT:  Head: Normocephalic.  Eyes: Conjunctivae and EOM are normal.  Pupils are equal, round, and reactive to light. No scleral icterus.  Discs sharp on funduscopic exam bilaterally. Patient has peripheral vision in all 4 quadrants. But she states that it does not look right.  Neck: Neck supple. No thyromegaly present.  Cardiovascular: Normal rate and regular rhythm.  Exam reveals no gallop and no friction rub.   No murmur heard. Pulmonary/Chest: No stridor. She has no wheezes. She has no rales. She exhibits no tenderness.  Abdominal: She exhibits no distension. There is no tenderness. There is no rebound.  Musculoskeletal: Normal range of motion. She exhibits no edema.  Lymphadenopathy:    She has no cervical adenopathy.  Neurological: She is oriented to person, place, and time. She exhibits normal muscle tone. Coordination normal.  Skin: No rash noted. No erythema.  Psychiatric: She has a normal mood and affect. Her behavior is normal.    ED Treatments / Results  DIAGNOSTIC STUDIES:  Oxygen Saturation is 97% on RA, normal by my interpretation.    COORDINATION OF CARE:  10:25 PM Will order Antivert. Discussed treatment plan with pt at bedside and pt agreed to plan.   Labs (all labs ordered are listed, but only abnormal results are displayed) Labs Reviewed  CBC WITH DIFFERENTIAL/PLATELET  COMPREHENSIVE METABOLIC PANEL  URINALYSIS, ROUTINE W REFLEX MICROSCOPIC    EKG  EKG Interpretation None       Radiology No results found.  Procedures Procedures (including critical care time)  Medications Ordered in ED Medications  ondansetron (ZOFRAN) injection 4 mg (4 mg Intravenous Given 06/30/16 2213)     Initial Impression / Assessment and Plan / ED Course  I have reviewed the triage vital signs and the nursing notes.  Pertinent labs & imaging results that were available during my care of the patient were reviewed by me and considered in my medical decision making (see chart for details).     Neurology was consulted and they felt that  this was not a stroke. Patient has hyponatremia and possible UTI with severe constipation. She will be admitted to hospitalist given normal saline also she will get an enema. Patient's visual problems may be evaluated by ophthalmologist if they don't improve  Final Clinical Impressions(s) / ED Diagnoses   Final diagnoses:  None    New Prescriptions New Prescriptions   No medications on file  The chart was scribed for me under my direct  supervision.  I personally performed the history, physical, and medical decision making and all procedures in the evaluation of this patient.Bethann Berkshire, MD 06/30/16 [phone]

## 2016-06-30 NOTE — ED Triage Notes (Signed)
Pt c/o blurred vision x 2 hours, nauseated all day, and states she has been treated for uti recently.

## 2016-06-30 NOTE — H&P (Signed)
History and Physical    [name] ION:[id number] DOB: March 20, 1932 DOA: 06/30/2016  PCP: Mikey Kirschner, MD   Patient coming from: Home.  I have personally briefly reviewed patient's old medical records in [place]  Chief Complaint: Blurred vision and nausea.  HPI: Katherine Lane is a 83 y.o. female with medical history significant of A. fib, anxiety, osteoarthritis, chronic back pain, chronic constipation, depression, hypertension, insomnia, osteoporosis, migraine headaches, trigeminal neuralgia who was brought to the emergency department due to blurred vision since around 1900 today and nausea since early in the morning.  Per patient, she woke up this morning having what she describes "extreme gas", follow by nausea and dry heaving. She has been constipated for several days. She mentions that she had a small bowel movement this morning, but continued to be constipated. She states that her nausea and dry heaving have persisted through the day, she has been unable to eat and  also has some abdominal discomfort. She complaints of progressively worse generalized weakness, particularly on her legs. She just recently completed UTI treatment with "sulfa".she denies fever, chills, night sweats, melena, hematochezia, dysuria or frequency. However, she mentions that she has been having trouble initiating her urination.  This evening, around 1900, the patient began having blurred vision and she thought she was going to have a migraine. Typically, this blurred vision lasted about 20 minutes in the past and then the patient would get a migraine. However, she did not get a migraine this time,  stated that her nausea got worse and she began feeling a spinning sensation when she closed her eyes. She denies focal weakness of her extremities or decreased sensation. She denies tinnitus, chest pain, palpitations, diaphoresis, recent pitting edema lower extremities, PND or orthopnea.   ED Course: the  patient received normal saline 500 mL bolus. Ondansetron 25 mg IVP 1, meclizine 25 mg by mouth 1 and Ativan 0.5 mg IVP 1.  Urine toxicology was negative. Urinalysis showed moderate hemoglobin, small leukocyte esterase, 6-30 WBC and rare bacteria. WBC 5.1, hemoglobin 13.3 g/dL and platelets 291. Sodium 124, potassium 4.6, chloride 94 and bicarbonate 21 mmol/L. BUN 14, creatinine 0.96, calcium 9.0, magnesium 2.2, phosphorus 3.1 and glucose 110 mg/dL. EKG showed atrial fibrillation, right axis deviation and borderline T-wave abnormalities. Troponin level was negative.  Imaging: single view abdomen shows moderate stool burden throughout the colon. CT brain did not show any acute intracranial pathology. Please see full radiology report for further details  Review of Systems: As per HPI otherwise 10 point review of systems negative.    Past Medical History:  Diagnosis Date  . A-fib ([place])   . Anxiety   . Arthritis   . Chronic back pain   . Constipation, chronic   . Depression   . HTN (hypertension)   . Insomnia   . Migraine headache   . Osteoporosis   . PONV (postoperative nausea and vomiting)   . Trigeminal neuralgia     Past Surgical History:  Procedure Laterality Date  . ANKLE FRACTURE SURGERY     2010-right  . CARDIAC CATHETERIZATION     denies  . CATARACT EXTRACTION W/PHACO Right 07/22/2012   Procedure: CATARACT EXTRACTION PHACO AND INTRAOCULAR LENS PLACEMENT (IOC);  Surgeon: Tonny Branch, MD;  Location: [facility];  Service: Ophthalmology;  Laterality: Right;  CDE:  18.93  . CATARACT EXTRACTION W/PHACO Left 08/09/2012   Procedure: CATARACT EXTRACTION PHACO AND INTRAOCULAR LENS PLACEMENT (IOC);  Surgeon: Tonny Branch, MD;  Location:  [facility];  Service: Ophthalmology;  Laterality: Left;  CDE: 17.21  . COLONOSCOPY    . HARDWARE REMOVAL  07/18/2011   Procedure: HARDWARE REMOVAL;  Surgeon: Carole Civil, MD;  Location: [facility];  Service: Orthopedics;  Laterality: Right;  . NERVE REPAIR   07/18/2011   Procedure: NERVE REPAIR;  Surgeon: Carole Civil, MD;  Location: [facility];  Service: Orthopedics;  Laterality: Right;  Right leg superficial peroneal nerve release    . PARTIAL HYSTERECTOMY       reports that she has never smoked. She has never used smokeless tobacco. She reports that she does not drink alcohol or use drugs.  Allergies  Allergen Reactions  . Codeine Nausea And Vomiting    Patient states intolerance to all pain medications  . Tramadol     Family History  Problem Relation Age of Onset  . Hypertension Mother   . Alzheimer's disease Mother   . Cancer Mother   . Hypertension Father   . Other Father        abused pain meds  . Diabetes Brother   . Other Brother        back problems  . Diabetes Daughter   . Anesthesia problems Neg Hx   . Hypotension Neg Hx   . Malignant hyperthermia Neg Hx   . Pseudochol deficiency Neg Hx     Prior to Admission medications   Medication Sig Start Date End Date Taking? Authorizing Provider  hydrochlorothiazide (MICROZIDE) 12.5 MG capsule Take 1 capsule (12.5 mg total) by mouth daily. 04/11/16 07/10/16 Yes Lendon Colonel, NP  LORazepam (ATIVAN) 0.5 MG tablet TAKE ONE TABLET DAILY AS NEEDED. 05/30/16  Yes Luking, Elayne Snare, MD  metoprolol succinate (TOPROL-XL) 25 MG 24 hr tablet TAKE ONE TABLET BY MOUTH ONCE DAILY. Patient taking differently: take one tablet by mouth once daily 04/18/16  Yes Herminio Commons, MD  nystatin (MYCOSTATIN/NYSTOP) powder Apply topically 4 (four) times daily. 06/20/16  Yes Derrek Monaco A, NP  ondansetron (ZOFRAN-ODT) 4 MG disintegrating tablet Take 4 mg by mouth once.   Yes [provider]  polyethylene glycol powder (MIRALAX) powder Take 17 g by mouth daily.    Yes [provider]  potassium chloride SA (K-DUR,KLOR-CON) 20 MEQ tablet Take 1 tablet (20 mEq total) by mouth daily. 04/14/16  Yes Lendon Colonel, NP  valsartan (DIOVAN) 160 MG tablet Take 1 tablet (160 mg  total) by mouth daily. 05/05/16  Yes Mikey Kirschner, MD  verapamil (CALAN-SR) 240 MG CR tablet TAKE (1) TABLET BY MOUTH EACH MORNING. 03/03/16  Yes Mikey Kirschner, MD  XARELTO 20 MG TABS tablet TAKE 1 TABLET DAILY WITH SUPPER. 03/20/16  Yes Herminio Commons, MD  zolpidem (AMBIEN) 10 MG tablet TAKE 1 TABLET BY MOUTH AT BEDTIME FOR SLEEP. 04/01/16  Yes Mikey Kirschner, MD  fluconazole (DIFLUCAN) 150 MG tablet Take 1 now and 1 in 3 days if needed Patient not taking: Reported on 06/30/2016 06/20/16   Estill Dooms, NP  pramoxine-hydrocortisone [facility]) 1-1 % rectal cream Place 1 application rectally 2 (two) times daily. Patient taking differently: Place 1 application rectally 2 (two) times daily as needed for hemorrhoids or itching.  01/01/16   Estill Dooms, NP  sulfamethoxazole-trimethoprim (BACTRIM DS,SEPTRA DS) 800-160 MG tablet Take 1 tablet by mouth 2 (two) times daily. Patient not taking: Reported on 06/30/2016 06/20/16   Estill Dooms, NP    Physical Exam: Vitals:   06/30/16 2230 06/30/16  2245 06/30/16 2300 06/30/16 2315  BP:  (!) 173/82 (!) 155/79   Pulse:      Resp:  (!) _0 Temp: 98 F (36.7 C)     TempSrc:      SpO2:      Weight:      Height:        Constitutional: NAD, calm, comfortable Eyes: PERRL, lids and conjunctivae normal ENMT: Mucous membranes are moist. Posterior pharynx clear of any exudate or lesions. Neck: normal, supple, no masses, no thyromegaly Respiratory: clear to auscultation bilaterally, no wheezing, no crackles. Normal respiratory effort. No accessory muscle use.  Cardiovascular: irregularly irregular no murmurs / rubs / gallops. No extremity edema. 2+ pedal pulses. No carotid bruits.  Abdomen: Bowel sounds positive. Soft, no tenderness, no masses palpated. No hepatosplenomegaly.  Musculoskeletal: no clubbing / cyanosis.  Good ROM, no contractures. Normal muscle tone.  Skin: no rashes, lesions, ulcers on limited skin  exam Neurologic: CN 2-12 grossly intact. Sensation intact, DTR normal. Strength 5/5 in all 4. Unable to evaluate gait. Psychiatric: Normal judgment and insight. Alert and oriented x 3. Mildly anxious mood.    Labs on Admission: I have personally reviewed following labs and imaging studies  CBC:  Recent Labs Lab 06/30/16 2123  WBC 5.1  NEUTROABS 2.2  HGB 13.3  HCT 37.5  MCV 86.4  PLT 834   Basic Metabolic Panel:  Recent Labs Lab 06/30/16 2123  NA 124*  K 4.6  CL 94*  CO2 21*  GLUCOSE 110*  BUN 14  CREATININE 0.96  CALCIUM 9.0   GFR: Estimated Creatinine Clearance: 39.1 mL/min (by C-G formula based on SCr of 0.96 mg/dL). Liver Function Tests:  Recent Labs Lab 06/30/16 2123  AST 26  ALT 16  ALKPHOS 66  BILITOT 0.8  PROT 7.0  ALBUMIN 4.3   No results for input(s): LIPASE, AMYLASE in the last 168 hours. No results for input(s): AMMONIA in the last 168 hours. Coagulation Profile:  Recent Labs Lab 06/30/16 2123  INR 3.25   Cardiac Enzymes:  Recent Labs Lab 06/30/16 2235  TROPONINI <0.03   BNP (last 3 results) No results for input(s): PROBNP in the last 8760 hours. HbA1C: No results for input(s): HGBA1C in the last 72 hours. CBG: No results for input(s): GLUCAP in the last 168 hours. Lipid Profile: No results for input(s): CHOL, HDL, LDLCALC, TRIG, CHOLHDL, LDLDIRECT in the last 72 hours. Thyroid Function Tests: No results for input(s): TSH, T4TOTAL, FREET4, T3FREE, THYROIDAB in the last 72 hours. Anemia Panel: No results for input(s): VITAMINB12, FOLATE, FERRITIN, TIBC, IRON, RETICCTPCT in the last 72 hours. Urine analysis:    Component Value Date/Time   COLORURINE YELLOW 06/30/2016 2213   APPEARANCEUR CLEAR 06/30/2016 2213   APPEARANCEUR Cloudy (A) 06/20/2016 1400   LABSPEC 1.013 06/30/2016 2213   PHURINE 5.0 06/30/2016 2213   GLUCOSEU NEGATIVE 06/30/2016 2213   HGBUR MODERATE (A) 06/30/2016 2213   BILIRUBINUR NEGATIVE 06/30/2016 2213    BILIRUBINUR Negative 06/20/2016 1400   KETONESUR NEGATIVE 06/30/2016 2213   PROTEINUR NEGATIVE 06/30/2016 2213   UROBILINOGEN 0.2 03/17/2016 1043   UROBILINOGEN 0.2 02/15/2009 1217   NITRITE NEGATIVE 06/30/2016 2213   LEUKOCYTESUR SMALL (A) 06/30/2016 2213   LEUKOCYTESUR 3+ (A) 06/20/2016 1400    Radiological Exams on Admission: Dg Abdomen 1 View  Result Date: 06/30/2016 CLINICAL DATA:  Constipation today.  Nausea. EXAM: ABDOMEN - 1 VIEW COMPARISON:  None. FINDINGS: Moderate stool burden in the colon. Small volume  of rectal stool. No dilated bowel to suggest obstruction. No evidence of free air. Multiple pelvic phleboliths. Calcified rib cartilage in the upper abdomen. No discrete radiopaque calculi. Mild scoliosis and degenerative change in the spine. IMPRESSION: Moderate stool burden throughout the colon. No evidence of obstruction. Electronically Signed   By: Jeb Levering M.D.   On: 06/30/2016 23:33   Ct Head Wo Contrast  Result Date: 06/30/2016 CLINICAL DATA:  Initial evaluation for acute blurry vision, nausea. EXAM: CT HEAD WITHOUT CONTRAST TECHNIQUE: Contiguous axial images were obtained from the base of the skull through the vertex without intravenous contrast. COMPARISON:  Prior CT from 12/06/2015. FINDINGS: Brain: Generalized cerebral atrophy with moderate chronic microvascular ischemic disease. No acute intracranial hemorrhage. No evidence for acute large vessel territory infarct. No mass lesion, midline shift or mass effect. No hydrocephalus. No extra-axial fluid collection. Vascular: No hyperdense vessel. Scattered vascular calcifications noted within the carotid siphons. Skull: Scalp soft tissues within normal limits.  Calvarium intact. Sinuses/Orbits: Globes and orbital soft tissues within normal limits. Other: Visualized paranasal sinuses are largely clear. No mastoid effusion. IMPRESSION: 1. No acute intracranial process identified. 2. Generalized age-related cerebral atrophy  with moderate chronic microvascular disease. Electronically Signed   By: Jeannine Boga M.D.   On: 06/30/2016 22:18   05/30/2013 2-D echocardiogram without contrast ------------------------------------------------------------ LV EF: 65% -  70%  ------------------------------------------------------------ Indications:   Atrial flutter 427.32.  ------------------------------------------------------------ History:  PMH:  Dyspnea. Risk factors: Hypertension.  ------------------------------------------------------------ Study Conclusions  - Left ventricle: The cavity size was normal. Wall thickness was increased in a pattern of mild LVH. Systolic function was vigorous. The estimated ejection fraction was in the range of 65% to 70%. Wall motion was normal; there were no regional wall motion abnormalities. Features are consistent with a pseudonormal left ventricular filling pattern, with concomitant abnormal relaxation and increased filling pressure (grade 2 diastolic dysfunction). - Aortic valve: Trileaflet; mildly calcified leaflets. Mild regurgitation. - Aortic root: The aortic root was mildly dilated. - Mitral valve: Calcified annulus. Mild regurgitation. - Left atrium: The atrium was mildly dilated. - Right ventricle: The cavity size was mildly dilated. - Right atrium: Central venous pressure: 53m Hg (est). - Atrial septum: No defect or patent foramen ovale was identified. - Tricuspid valve: Trivial regurgitation. - Pulmonary arteries: PA peak pressure: 325mHg (S). - Pericardium, extracardiac: There was no pericardial effusion. Impressions:  - Normal LV chamber size with mild LVH and LVEF 6514-48%grade 2 diastolic dysfunction. Mild left atrial enlargement. MAC with mild mitral regurgitation. Mildly dilated aortic root based on BSA. Mildly sclerotic aortic valve with mild regurgitation. Mild RV enlargement. PASP upper normal 34  mmHg.  EKG: Independently reviewed. Vent. rate 72 BPM PR interval * ms QRS duration 89 ms QT/QTc 426/467 ms P-R-T axes * 92 1 Atrial fibrillation Right axis deviation Borderline T abnormalities, inferior leads  Assessment/Plan Principal Problem:   Hyponatremia Observation/telemetry. Hold hydrochlorothiazide. Start normal saline at 100 mL/hour for 20 hours. Follow-up sodium level. Consider discontinuing hydrochlorothiazide, since the patient became very confused on the previous hyponatremia episode per her daughter RoBailey Mech Active Problems:   Blurred vision, bilateral Not sure about the etiology. Consider hyponatremia versus atypical migraine. Tele-neurology suggested ophthalmology evaluation, maybe MRI scan. It is very unlikely that the patient had a TIA versus CVA since she is therapeutically anticoagulated.    Trigeminal neuralgia/migraine headaches Per patient, her last TM episode was several months ago. She states that her last migraine headache was years ago. However  blurred vision symptoms are similar to migraine prodrome.    Essential hypertension Hold hydrochlorothiazide. Continue verapamil 240 mg by mouth daily. Continue metoprolol ER 25 mg by mouth daily. Continue valsartan 160 mg by mouth daily or generic equivalent. Monitor blood pressure, renal function and electrolytes.    CONSTIPATION, CHRONIC Soapsuds enema ordered by ED. Continue MiraLAX. Consider decreasing verapamil and increase in metoprolol to decrease constipation.    Atrial fibrillation (HCC) CHA2DS2-VASc Score of at least 5 Continue Xarelto 20 mg by mouth with supper. Continue verapamil 240 mg by mouth daily. Continue metoprolol ER 25 mg by mouth daily.    Anxiety Continue lorazepam as needed.    DVT prophylaxis: On Xarelto Code Status: Full code. Family Communication: Her daughter Bailey Mech was present in the ED room. Disposition Plan: admit for sodium replacement and reevaluation in  a.m. Consults called: neurology was consulted by teleconference earlier. Admission status: Observation/telemetry.   Reubin Milan MD [facility] Pager [phone]  If 7PM-7AM, please contact night-coverage [email or web address] Password [facility]  06/30/2016, 11:55 PM

## 2016-06-30 NOTE — ED Notes (Signed)
[facility] @ [phone] [phone]

## 2016-06-30 NOTE — ED Notes (Signed)
This tech entered pt's room. Pt was standing beside the bed with family member at side. This tech explained to the family member and also the pt that pt will need to stay in bed. Pt reluctantly returned to bed. When this tech tried to raise side rails on bed, pt adamantly refused stating that she was claustrophobic and would not have the rails raised on any bed she was in.

## 2016-06-30 NOTE — Progress Notes (Signed)
Code Stroke  Beeper went off at  1030pm Order Placed at   925pm Tried to get pt. At  930 Unable to get.  Needed an IV and meds per RN Pt. In CT room at  950 Radiologist   [facility]    Images sent at 1030 when beeper went off   Pt. Was originally not a code stroke until 1030pm.    CT head was already dictated so I did not call radiologist or [facility].   If TPA was delayed it was not due to CT.   We were never informed from the beginning that she was a code stroke.

## 2016-06-30 NOTE — ED Notes (Signed)
Code Stroke Cancelled per orders of Dr. Estell HarpinZammit.

## 2016-07-01 ENCOUNTER — Encounter (HOSPITAL_COMMUNITY): Payer: Self-pay | Admitting: Internal Medicine

## 2016-07-01 ENCOUNTER — Observation Stay (HOSPITAL_BASED_OUTPATIENT_CLINIC_OR_DEPARTMENT_OTHER): Payer: Medicare Other

## 2016-07-01 DIAGNOSIS — I340 Nonrheumatic mitral (valve) insufficiency: Secondary | ICD-10-CM

## 2016-07-01 DIAGNOSIS — H538 Other visual disturbances: Secondary | ICD-10-CM | POA: Diagnosis present

## 2016-07-01 DIAGNOSIS — G43909 Migraine, unspecified, not intractable, without status migrainosus: Secondary | ICD-10-CM | POA: Diagnosis present

## 2016-07-01 LAB — CBC WITH DIFFERENTIAL/PLATELET
Basophils Absolute: 0 10*3/uL (ref 0.0–0.1)
Basophils Relative: 1 %
Eosinophils Absolute: 0 10*3/uL (ref 0.0–0.7)
Eosinophils Relative: 1 %
HCT: 35 % — ABNORMAL LOW (ref 36.0–46.0)
Hemoglobin: 12 g/dL (ref 12.0–15.0)
Lymphocytes Relative: 40 %
Lymphs Abs: 1.7 10*3/uL (ref 0.7–4.0)
MCH: 30.3 pg (ref 26.0–34.0)
MCHC: 34.3 g/dL (ref 30.0–36.0)
MCV: 88.4 fL (ref 78.0–100.0)
Monocytes Absolute: 0.4 10*3/uL (ref 0.1–1.0)
Monocytes Relative: 9 %
Neutro Abs: 2.2 10*3/uL (ref 1.7–7.7)
Neutrophils Relative %: 51 %
Platelets: 272 10*3/uL (ref 150–400)
RBC: 3.96 MIL/uL (ref 3.87–5.11)
RDW: 13.4 % (ref 11.5–15.5)
WBC: 4.4 10*3/uL (ref 4.0–10.5)

## 2016-07-01 LAB — BASIC METABOLIC PANEL
Anion gap: 7 (ref 5–15)
BUN: 11 mg/dL (ref 6–20)
CO2: 22 mmol/L (ref 22–32)
Calcium: 8.4 mg/dL — ABNORMAL LOW (ref 8.9–10.3)
Chloride: 97 mmol/L — ABNORMAL LOW (ref 101–111)
Creatinine, Ser: 0.83 mg/dL (ref 0.44–1.00)
GFR calc Af Amer: >60 mL/min (ref >60)
GFR calc non Af Amer: >60 mL/min (ref >60)
Glucose, Bld: 101 mg/dL — ABNORMAL HIGH (ref 65–99)
Potassium: 4.1 mmol/L (ref 3.5–5.1)
Sodium: 126 mmol/L — ABNORMAL LOW (ref 135–145)

## 2016-07-01 LAB — ECHOCARDIOGRAM COMPLETE
Height: 63 in
Weight: 2208 oz

## 2016-07-01 LAB — PHOSPHORUS: Phosphorus: 3.1 mg/dL (ref 2.5–4.6)

## 2016-07-01 LAB — MAGNESIUM: Magnesium: 2.2 mg/dL (ref 1.7–2.4)

## 2016-07-01 MED ORDER — POLYETHYLENE GLYCOL 3350 17 GM/SCOOP PO POWD
17.0000 g | Freq: Every day | ORAL | Status: DC | PRN
  Filled 2016-07-01: qty 255

## 2016-07-01 MED ORDER — HYDRALAZINE HCL 20 MG/ML IJ SOLN: 5.0000 mg | INTRAMUSCULAR | Status: DC | PRN

## 2016-07-01 MED ORDER — LORAZEPAM 0.5 MG PO TABS
0.5000 mg | ORAL_TABLET | Freq: Four times a day (QID) | ORAL | Status: DC | PRN
  Administered 2016-07-01: 0.5 mg via ORAL
  Filled 2016-07-01: qty 1

## 2016-07-01 MED ORDER — POTASSIUM CHLORIDE CRYS ER 20 MEQ PO TBCR
20.0000 meq | EXTENDED_RELEASE_TABLET | Freq: Every day | ORAL | Status: DC
  Filled 2016-07-01: qty 1

## 2016-07-01 MED ORDER — ONDANSETRON HCL 4 MG PO TABS: 4.0000 mg | ORAL_TABLET | Freq: Four times a day (QID) | ORAL | Status: DC | PRN

## 2016-07-01 MED ORDER — HYDROCORTISONE ACE-PRAMOXINE 1-1 % RE CREA
1.0000 "application " | TOPICAL_CREAM | Freq: Two times a day (BID) | RECTAL | Status: DC | PRN
  Filled 2016-07-01: qty 30

## 2016-07-01 MED ORDER — MAGNESIUM CITRATE PO SOLN
0.5000 | Freq: Once | ORAL | Status: AC
  Administered 2016-07-01: 0.5 via ORAL
  Filled 2016-07-01: qty 296

## 2016-07-01 MED ORDER — METOPROLOL SUCCINATE ER 25 MG PO TB24
25.0000 mg | ORAL_TABLET | Freq: Every day | ORAL | Status: DC
  Administered 2016-07-01 – 2016-07-02 (×2): 25 mg via ORAL
  Filled 2016-07-01 (×2): qty 1

## 2016-07-01 MED ORDER — ONDANSETRON HCL 4 MG/2ML IJ SOLN: 4.0000 mg | Freq: Four times a day (QID) | INTRAMUSCULAR | Status: DC | PRN

## 2016-07-01 MED ORDER — VERAPAMIL HCL ER 240 MG PO TBCR
240.0000 mg | EXTENDED_RELEASE_TABLET | Freq: Every day | ORAL | Status: DC
  Administered 2016-07-01 – 2016-07-02 (×2): 240 mg via ORAL
  Filled 2016-07-01 (×2): qty 1

## 2016-07-01 MED ORDER — ZOLPIDEM TARTRATE 5 MG PO TABS
5.0000 mg | ORAL_TABLET | Freq: Every evening | ORAL | Status: DC | PRN
  Administered 2016-07-01 (×2): 5 mg via ORAL
  Filled 2016-07-01 (×2): qty 1

## 2016-07-01 MED ORDER — IRBESARTAN 150 MG PO TABS
150.0000 mg | ORAL_TABLET | Freq: Every day | ORAL | Status: DC
  Administered 2016-07-01 – 2016-07-02 (×2): 150 mg via ORAL
  Filled 2016-07-01 (×2): qty 1

## 2016-07-01 MED ORDER — POLYETHYLENE GLYCOL 3350 17 G PO PACK: 17.0000 g | PACK | Freq: Every day | ORAL | Status: DC | PRN

## 2016-07-01 MED ORDER — RIVAROXABAN 20 MG PO TABS
20.0000 mg | ORAL_TABLET | Freq: Every day | ORAL | Status: DC
  Administered 2016-07-01: 20 mg via ORAL
  Filled 2016-07-01: qty 1

## 2016-07-01 MED ORDER — SODIUM CHLORIDE 0.9 % IV SOLN
INTRAVENOUS | Status: DC
  Administered 2016-07-01 (×2): via INTRAVENOUS

## 2016-07-01 NOTE — Care Management Obs Status (Signed)
MEDICARE OBSERVATION STATUS NOTIFICATION   Patient Details  Name: Katherine Lane MRN: [id number] Date of Birth: 1932-09-30   Medicare Observation Status Notification Given:  Yes    Malcolm MetroChildress, Helix Lafontaine Demske, RN 07/01/2016, 2:27 PM

## 2016-07-01 NOTE — Progress Notes (Signed)
Pt given soap suds enema per MD order. Pt has had watery BM x1. Will continue to monitor pt

## 2016-07-01 NOTE — Progress Notes (Signed)
Dr. Robb Matarrtiz paged and made aware pt has a 2.16 second pause between beats. Waiting for orders.

## 2016-07-01 NOTE — Progress Notes (Signed)
*  PRELIMINARY RESULTS* Echocardiogram 2D Echocardiogram has been performed.  Jeryl Columbialliott, Memori Sammon 07/01/2016, 4:16 PM

## 2016-07-01 NOTE — Progress Notes (Signed)
FOLLOW UP NOTE FROM EARLIER ADMISSION TODAY:  83 yo with hx of recurrent migraines, labile HTN on 2 antiHTN meds and diuretics, afib on Xarelto and negative inotropes, hx of constipation, prior hx of hyponatremia (symptomatic), admitted for neurological symptoms suspicious for migraine, along with hyponatremia with Na of 124.  She was given IVF.  She felt better and her neurological symptoms resolved.  She is still slightly nauseated.  Exam is unremarkable. Will continue with proper correct of Na.  Tx her constipation with a bowel regimen.  Tx her migraine symptomatically.  Continue with her meds including Verapamil (cause constipation), betablocker, Xarelto, and permanently d/c her diuretics. She has no hx of CHF.  Houston SirenPeter Eline Geng MD FACP. Hospitalist.

## 2016-07-02 DIAGNOSIS — F419 Anxiety disorder, unspecified: Secondary | ICD-10-CM | POA: Diagnosis not present

## 2016-07-02 DIAGNOSIS — K5909 Other constipation: Secondary | ICD-10-CM | POA: Diagnosis not present

## 2016-07-02 DIAGNOSIS — I480 Paroxysmal atrial fibrillation: Secondary | ICD-10-CM | POA: Diagnosis not present

## 2016-07-02 DIAGNOSIS — G43C0 Periodic headache syndromes in child or adult, not intractable: Secondary | ICD-10-CM | POA: Diagnosis not present

## 2016-07-02 DIAGNOSIS — E871 Hypo-osmolality and hyponatremia: Secondary | ICD-10-CM | POA: Diagnosis not present

## 2016-07-02 DIAGNOSIS — I10 Essential (primary) hypertension: Secondary | ICD-10-CM | POA: Diagnosis not present

## 2016-07-02 DIAGNOSIS — H538 Other visual disturbances: Secondary | ICD-10-CM

## 2016-07-02 LAB — URINE CULTURE: Culture: <10000 — AB

## 2016-07-02 LAB — GLUCOSE, CAPILLARY
Glucose-Capillary: 78 mg/dL (ref 65–99)
Glucose-Capillary: 87 mg/dL (ref 65–99)

## 2016-07-02 LAB — BASIC METABOLIC PANEL
Anion gap: 5 (ref 5–15)
BUN: 14 mg/dL (ref 6–20)
CO2: 25 mmol/L (ref 22–32)
Calcium: 8.3 mg/dL — ABNORMAL LOW (ref 8.9–10.3)
Chloride: 100 mmol/L — ABNORMAL LOW (ref 101–111)
Creatinine, Ser: 0.87 mg/dL (ref 0.44–1.00)
GFR calc Af Amer: >60 mL/min (ref >60)
GFR calc non Af Amer: >60 mL/min (ref >60)
Glucose, Bld: 88 mg/dL (ref 65–99)
Potassium: 4.4 mmol/L (ref 3.5–5.1)
Sodium: 130 mmol/L — ABNORMAL LOW (ref 135–145)

## 2016-07-02 NOTE — Care Management Note (Signed)
Case Management Note  Patient Details  Name: Katherine RicksBetty M Lane MRN: [id number] Date of Birth: 11-12-1932  Subjective/Objective:                  Pt admitted with hyponatremia. She is from home and is ind with ADL's. She has PCP, transportation and insurance with drug coverage. Family at bedside. She has no HH or DME needs. She communicates no needs.   Action/Plan: Pt discharging home today with self care.   Expected Discharge Date:  07/02/16               Expected Discharge Plan:  Home/Self Care  In-House Referral:  NA  Discharge planning Services  CM Consult  Post Acute Care Choice:  NA Choice offered to:  NA  Status of Service:  Completed, signed off  Malcolm MetroChildress, Brittanny Levenhagen Demske, RN 07/02/2016, 2:08 PM

## 2016-07-02 NOTE — Discharge Summary (Signed)
Physician Discharge Summary  Katherine Lane UYQ:[id number] DOB: [birth year] DOA: 06/30/2016  PCP: Merlyn AlbertLuking, William S, MD  Admit date: 06/30/2016 Discharge date: 07/02/2016  Admitted From: Home  Disposition:  Home   Recommendations for Outpatient Follow-up:  1. Follow up with PCP in 1-2 weeks 2. Please obtain BMP/CBC in one week 3. Please follow up on the following pending results:  Home Health: No Equipment/Devices: None   Discharge Condition: Stable  CODE STATUS: Full Code  Diet recommendation: Heart Healthy  Brief/Interim Summary: Katherine RicksBetty M Pauling is a 83 y.o. female with medical history significant of A. fib, anxiety, osteoarthritis, chronic back pain, chronic constipation, depression, hypertension, insomnia, osteoporosis, migraine headaches, trigeminal neuralgia who was brought to the emergency department due to blurred vision since around 1900 today and nausea since early in the morning.  Per patient, she woke up this morning having what she describes "extreme gas", follow by nausea and dry heaving. She has been constipated for several days. She mentions that she had a small bowel movement this morning, but continued to be constipated. She states that her nausea and dry heaving have persisted through the day, she has been unable to eat and  also has some abdominal discomfort. She complaints of progressively worse generalized weakness, particularly on her legs. She just recently completed UTI treatment with "sulfa".she denies fever, chills, night sweats, melena, hematochezia, dysuria or frequency. However, she mentions that she has been having trouble initiating her urination.  This evening, around 1900, the patient began having blurred vision and she thought she was going to have a migraine. Typically, this blurred vision lasted about 20 minutes in the past and then the patient would get a migraine. However, she did not get a migraine this time,  stated that her nausea got worse and she  began feeling a spinning sensation when she closed her eyes. She denies focal weakness of her extremities or decreased sensation. She denies tinnitus, chest pain, palpitations, diaphoresis, recent pitting edema lower extremities, PND or orthopnea.   Patient was admitted and given IVF.  Her sodium level increased from 124 to 130 on day of discharge.  Upon review of the chart patient had previously had low sodium levels 10 months ago and was told to stop HCTZ.  I spoke at length with patient and her daughter about not restarting this medication.  She was instructed that if her blood pressure increased substantially she could call her cardiologist or come to the emergency department.  Patient reported complete resolution of visual changes on day of discharge and discussed with her and her daughter possible MRI imaging to ensure no intracranial abnormalities which patient refused.  Patient reports she has an appointment in two days with her OB/Gyn for which she will get a repeat urine test.  She was instructed to follow up with her PCP and cardiologist for further blood pressure control.  Return precautions given to patient.  She had had numerous bowel movements before discharge.  Discharge Diagnoses:  Principal Problem:   Hyponatremia Active Problems:   Essential hypertension   CONSTIPATION, CHRONIC   Trigeminal neuralgia   Atrial fibrillation (HCC)   Anxiety   Migraine headache   Blurred vision, bilateral    Discharge Instructions  Discharge Instructions    Call MD for:  difficulty breathing, headache or visual disturbances    Complete by:  As directed    Call MD for:  extreme fatigue    Complete by:  As directed    Call MD for:  hives    Complete by:  As directed    Call MD for:  persistant dizziness or light-headedness    Complete by:  As directed    Call MD for:  persistant nausea and vomiting    Complete by:  As directed    Call MD for:  severe uncontrolled pain    Complete by:  As  directed    Call MD for:  temperature >100.4    Complete by:  As directed    Diet - low sodium heart healthy    Complete by:  As directed    Discharge instructions    Complete by:  As directed    Stop taking HCTZ (hydrochlorothiazide) Discuss repeat urine test on Friday (2 days) with your provider to ensure resolution of hematuria Limit water consumption to 1.5L daily Get repeat electrolyte test within 1 week See your PCP within 1 week Schedule follow up with cardiology within 3-4 weeks   Increase activity slowly    Complete by:  As directed      Allergies as of 07/02/2016      Reactions   Codeine Nausea And Vomiting   Patient states intolerance to all pain medications   Tramadol       Medication List    STOP taking these medications   fluconazole 150 MG tablet Commonly known as:  DIFLUCAN   hydrochlorothiazide 12.5 MG capsule Commonly known as:  MICROZIDE   sulfamethoxazole-trimethoprim 800-160 MG tablet Commonly known as:  BACTRIM DS,SEPTRA DS     TAKE these medications   ANALPRAM-HC 1-1 % rectal cream Generic drug:  pramoxine-hydrocortisone APPLY TO AFFECTED AREAS TWICE DAILY. What changed:  See the new instructions.   LORazepam 0.5 MG tablet Commonly known as:  ATIVAN TAKE ONE TABLET DAILY AS NEEDED.   metoprolol succinate 25 MG 24 hr tablet Commonly known as:  TOPROL-XL TAKE ONE TABLET BY MOUTH ONCE DAILY. What changed:  See the new instructions.   MIRALAX powder Generic drug:  polyethylene glycol powder Take 17 g by mouth daily.   nystatin powder Commonly known as:  MYCOSTATIN/NYSTOP Apply topically 4 (four) times daily.   ondansetron 4 MG disintegrating tablet Commonly known as:  ZOFRAN-ODT Take 4 mg by mouth once.   potassium chloride SA 20 MEQ tablet Commonly known as:  K-DUR,KLOR-CON Take 1 tablet (20 mEq total) by mouth daily.   valsartan 160 MG tablet Commonly known as:  DIOVAN Take 1 tablet (160 mg total) by mouth daily.   verapamil  240 MG CR tablet Commonly known as:  CALAN-SR TAKE (1) TABLET BY MOUTH EACH MORNING.   XARELTO 20 MG Tabs tablet Generic drug:  rivaroxaban TAKE 1 TABLET DAILY WITH SUPPER.   zolpidem 10 MG tablet Commonly known as:  AMBIEN TAKE 1 TABLET BY MOUTH AT BEDTIME FOR SLEEP.       Allergies  Allergen Reactions  . Codeine Nausea And Vomiting    Patient states intolerance to all pain medications  . Tramadol     Consultations:  Case Management   Procedures/Studies: Dg Abdomen 1 View  Result Date: 06/30/2016 CLINICAL DATA:  Constipation today.  Nausea. EXAM: ABDOMEN - 1 VIEW COMPARISON:  None. FINDINGS: Moderate stool burden in the colon. Small volume of rectal stool. No dilated bowel to suggest obstruction. No evidence of free air. Multiple pelvic phleboliths. Calcified rib cartilage in the upper abdomen. No discrete radiopaque calculi. Mild scoliosis and degenerative change in the spine. IMPRESSION: Moderate stool burden throughout the colon. No evidence  of obstruction. Electronically Signed   By: Rubye Oaks M.D.   On: 06/30/2016 23:33   Ct Head Wo Contrast  Result Date: 06/30/2016 CLINICAL DATA:  Initial evaluation for acute blurry vision, nausea. EXAM: CT HEAD WITHOUT CONTRAST TECHNIQUE: Contiguous axial images were obtained from the base of the skull through the vertex without intravenous contrast. COMPARISON:  Prior CT from 12/06/2015. FINDINGS: Brain: Generalized cerebral atrophy with moderate chronic microvascular ischemic disease. No acute intracranial hemorrhage. No evidence for acute large vessel territory infarct. No mass lesion, midline shift or mass effect. No hydrocephalus. No extra-axial fluid collection. Vascular: No hyperdense vessel. Scattered vascular calcifications noted within the carotid siphons. Skull: Scalp soft tissues within normal limits.  Calvarium intact. Sinuses/Orbits: Globes and orbital soft tissues within normal limits. Other: Visualized paranasal  sinuses are largely clear. No mastoid effusion. IMPRESSION: 1. No acute intracranial process identified. 2. Generalized age-related cerebral atrophy with moderate chronic microvascular disease. Electronically Signed   By: Rise Mu M.D.   On: 06/30/2016 22:18    Echo: Left ventricle: The cavity size was normal. Wall thickness was increased in a pattern of mild LVH. Systolic function was vigorous. The estimated ejection fraction was in the range of 65% to 70%. Wall motion was normal; there were no regional wall motion abnormalities. The study was not technically sufficient to allow evaluation of LV diastolic dysfunction due to atrial fibrillation. Aortic valve: There was mild regurgitation. Mitral valve: There was mild regurgitation. Tricuspid valve: There was mild regurgitation.   Subjective: Patient reports she feels very well.  States that she feels ready to go home.  Daughter is bedside and both are asking what medication patient needs to defer in the future.    Discharge Exam: Vitals:   07/01/16 2206 07/02/16 0637  BP: (!) 99/59 (!) 129/58  Pulse: 85 (!) 58  Resp: 18 18  Temp: 97.7 F (36.5 C) 98.6 F (37 C)   Vitals:   07/01/16 1448 07/01/16 2039 07/01/16 2206 07/02/16 0637  BP: (!) 107/54  (!) 99/59 (!) 129/58  Pulse: (!) 52  85 (!) 58  Resp: 20  18 18   Temp:   97.7 F (36.5 C) 98.6 F (37 C)  TempSrc:   Oral Oral  SpO2: 98% 96% 95% 99%  Weight:      Height:        General: Pt is alert, awake, not in acute distress Cardiovascular: irregularly irregular, S1/S2 +, no rubs, no gallops Respiratory: CTA bilaterally, no wheezing, no rhonchi Abdominal: Soft, NT, ND, bowel sounds + Extremities: no edema, no cyanosis    The results of significant diagnostics from this hospitalization (including imaging, microbiology, ancillary and laboratory) are listed below for reference.     Microbiology: Recent Results (from the past 240 hour(s))  Urine culture     Status:  Abnormal   Collection Time: 06/30/16 10:13 PM  Result Value Ref Range Status   Specimen Description URINE, RANDOM  Final   Special Requests NONE  Final   Culture (A)  Final    <10,000 COLONIES/mL INSIGNIFICANT GROWTH Performed at [facility], [address], [place], [place] [address]    Report Status 07/02/2016 FINAL  Final     Labs: BNP (last 3 results) No results for input(s): BNP in the last 8760 hours. Basic Metabolic Panel:  Recent Labs Lab 06/30/16 2123 06/30/16 2235 07/01/16 0419 07/02/16 0429  NA 124*  --  126* 130*  K 4.6  --  4.1 4.4  CL 94*  --  97* 100*  CO2 21*  --  22 25  GLUCOSE 110*  --  101* 88  BUN 14  --  11 14  CREATININE 0.96  --  0.83 0.87  CALCIUM 9.0  --  8.4* 8.3*  MG  --  2.2  --   --   PHOS  --  3.1  --   --    Liver Function Tests:  Recent Labs Lab 06/30/16 2123  AST 26  ALT 16  ALKPHOS 66  BILITOT 0.8  PROT 7.0  ALBUMIN 4.3   No results for input(s): LIPASE, AMYLASE in the last 168 hours. No results for input(s): AMMONIA in the last 168 hours. CBC:  Recent Labs Lab 06/30/16 2123 07/01/16 0419  WBC 5.1 4.4  NEUTROABS 2.2 2.2  HGB 13.3 12.0  HCT 37.5 35.0*  MCV 86.4 88.4  PLT 291 272   Cardiac Enzymes:  Recent Labs Lab 06/30/16 2235  TROPONINI <0.03   BNP: Invalid input(s): POCBNP CBG:  Recent Labs Lab 07/02/16 0812 07/02/16 1157  GLUCAP 78 87   D-Dimer No results for input(s): DDIMER in the last 72 hours. Hgb A1c No results for input(s): HGBA1C in the last 72 hours. Lipid Profile No results for input(s): CHOL, HDL, LDLCALC, TRIG, CHOLHDL, LDLDIRECT in the last 72 hours. Thyroid function studies No results for input(s): TSH, T4TOTAL, T3FREE, THYROIDAB in the last 72 hours.  Invalid input(s): FREET3 Anemia work up No results for input(s): VITAMINB12, FOLATE, FERRITIN, TIBC, IRON, RETICCTPCT in the last 72 hours. Urinalysis    Component Value Date/Time   COLORURINE YELLOW 06/30/2016 2213    APPEARANCEUR CLEAR 06/30/2016 2213   APPEARANCEUR Cloudy (A) 06/20/2016 1400   LABSPEC 1.013 06/30/2016 2213   PHURINE 5.0 06/30/2016 2213   GLUCOSEU NEGATIVE 06/30/2016 2213   HGBUR MODERATE (A) 06/30/2016 2213   BILIRUBINUR NEGATIVE 06/30/2016 2213   BILIRUBINUR Negative 06/20/2016 1400   KETONESUR NEGATIVE 06/30/2016 2213   PROTEINUR NEGATIVE 06/30/2016 2213   UROBILINOGEN 0.2 03/17/2016 1043   UROBILINOGEN 0.2 02/15/2009 1217   NITRITE NEGATIVE 06/30/2016 2213   LEUKOCYTESUR SMALL (A) 06/30/2016 2213   LEUKOCYTESUR 3+ (A) 06/20/2016 1400   Sepsis Labs Invalid input(s): PROCALCITONIN,  WBC,  LACTICIDVEN Microbiology Recent Results (from the past 240 hour(s))  Urine culture     Status: Abnormal   Collection Time: 06/30/16 10:13 PM  Result Value Ref Range Status   Specimen Description URINE, RANDOM  Final   Special Requests NONE  Final   Culture (A)  Final    <10,000 COLONIES/mL INSIGNIFICANT GROWTH Performed at [facility], [address], [place], [place] [address]    Report Status 07/02/2016 FINAL  Final     Time coordinating discharge: 40 minutes  SIGNED:   Katrinka Blazing, MD  [facility] 07/02/2016, 1:11 PM Pager [phone] If 7PM-7AM, please contact night-coverage [email or web address] Password [email or web address]

## 2016-07-04 ENCOUNTER — Encounter: Payer: Self-pay | Admitting: Adult Health

## 2016-07-04 ENCOUNTER — Telehealth: Payer: Self-pay | Admitting: Physician Assistant

## 2016-07-04 ENCOUNTER — Ambulatory Visit (INDEPENDENT_AMBULATORY_CARE_PROVIDER_SITE_OTHER): Payer: Medicare Other | Admitting: Adult Health

## 2016-07-04 VITALS — BP 170/60 | HR 74 | Ht 63.0 in | Wt 146.5 lb

## 2016-07-04 DIAGNOSIS — Z87440 Personal history of urinary (tract) infections: Secondary | ICD-10-CM | POA: Diagnosis not present

## 2016-07-04 DIAGNOSIS — M25472 Effusion, left ankle: Secondary | ICD-10-CM | POA: Diagnosis not present

## 2016-07-04 DIAGNOSIS — M25471 Effusion, right ankle: Secondary | ICD-10-CM

## 2016-07-04 LAB — POCT URINALYSIS DIPSTICK
Blood, UA: NEGATIVE
Glucose, UA: NEGATIVE
Ketones, UA: NEGATIVE
Nitrite, UA: NEGATIVE
Protein, UA: NEGATIVE

## 2016-07-04 NOTE — Telephone Encounter (Signed)
Spoke with pt. Explained her fluid and salt restriction. I advised her to be sure to elevate her legs & use compression hose. She voiced understanding. I also told her is symptoms worsen to go to ER to be evaluated. Pt agrees if symptoms worsenedd she will go to ER.

## 2016-07-04 NOTE — Progress Notes (Signed)
Subjective:     Patient ID: Katherine RicksBetty M Lane, female   DOB: Oct 08, 1932, 83 y.o.   MRN: [id number]  HPI Katherine Lane is a 83 year old white female in for F/U of recent UTI, was seen in ER Monday when had tunnel vision,nausea and weak,  and was admitted and found to have low sodium level.Has gained 5 lbs and ankles swelling and Short of breath at times, her fluid pill was stopped when in hospital. Katherine Lane with mom today.Katherine Lane also says skin fungus better. No history of CHF.   Review of Systems Peeing Ok Swelling on ankles Weight gain of 5 lbs since been in hospital Was short of  breath at home  No Hx of CHF Reviewed past medical,surgical, social and family history. Reviewed medications and allergies.     Objective:   Physical Exam BP (!) 170/60 (BP Location: Left Arm, Patient Position: Sitting, Cuff Size: Normal)   Pulse 74   Ht 5\' 3"  (1.6 m)   Wt 146 lb 8 oz (66.5 kg)   BMI 25.95 kg/m urine 2+ leuks. Skin warm and dry. Lungs: clear to ausculation bilaterally. Cardiovascular: regular rate and rhythm.   Has 1+ PE in both ankles and has increase in weight. I tried to  call cardiology, while they were here, but had to leave message to call me back. And they did, will call pt.   Assessment:     1. History of UTI   2. Swelling of both ankles       Plan:     Try lemon in water  I called cardiology and spoke with Dana,PA  and she  said they will call pt.I called Katherine Lane and told her they would call her Mom. Follow up prn

## 2016-07-04 NOTE — Telephone Encounter (Signed)
[facility] contacted our [place] office today with patient's concern regarding edema. She has h/o AF and HTN followed by Dr. Purvis SheffieldKoneswaran, no prior history of CHF that I can see . Recently admitted a few days ago for hyponatremia felt due to HCTZ which was discontinued. She was given IVF. Per GYN NP, at today's visit she had no signs of lower extremity erythema or redness but did have 1+ pitting ankle edema, weight was up 5 lb. Pt was in no acute distress and left the office in comfortable condition. I do not feel comfortable actively restarting diuretic given recent hyponatremia likely due to this.   Reviewed with Dr. Diona BrownerMcDowell in office. Please call patient. She should restrict her fluid over the next few days to 1.5L (approximately 6 8-oz glasses of fluid per day) and not go overboard on sodium intake (ballpark 2,000mg  per day). The fluid restriction includes all fluid like coffee, tea, water, etc. Try to keep somewhat active to mobilize the fluid. Keep legs elevated and use compression hose. If edema is still present by Monday please schedule for OV. Recent echo showed normal LVEF which was reassuring. If any worsening symptoms over the weekend she needs to come to ER for re-eval. Ronie Spiesayna Arlo Butt PA-C

## 2016-07-10 ENCOUNTER — Encounter (HOSPITAL_COMMUNITY): Payer: Self-pay | Admitting: *Deleted

## 2016-07-10 ENCOUNTER — Encounter: Payer: Self-pay | Admitting: Nurse Practitioner

## 2016-07-10 ENCOUNTER — Emergency Department (HOSPITAL_COMMUNITY): Payer: Medicare Other

## 2016-07-10 ENCOUNTER — Encounter (HOSPITAL_COMMUNITY): Payer: Self-pay | Admitting: Family Medicine

## 2016-07-10 ENCOUNTER — Observation Stay (HOSPITAL_COMMUNITY)
Admission: EM | Admit: 2016-07-10 | Discharge: 2016-07-12 | Disposition: A | Discharge status: Home / Self Care | Payer: Medicare Other | Attending: Internal Medicine | Admitting: Internal Medicine

## 2016-07-10 ENCOUNTER — Ambulatory Visit (INDEPENDENT_AMBULATORY_CARE_PROVIDER_SITE_OTHER): Payer: Medicare Other | Admitting: Nurse Practitioner

## 2016-07-10 VITALS — BP 130/78 | Ht 61.0 in | Wt 142.4 lb

## 2016-07-10 DIAGNOSIS — R053 Chronic cough: Secondary | ICD-10-CM | POA: Diagnosis present

## 2016-07-10 DIAGNOSIS — J811 Chronic pulmonary edema: Secondary | ICD-10-CM | POA: Diagnosis not present

## 2016-07-10 DIAGNOSIS — E871 Hypo-osmolality and hyponatremia: Secondary | ICD-10-CM | POA: Diagnosis present

## 2016-07-10 DIAGNOSIS — I10 Essential (primary) hypertension: Secondary | ICD-10-CM | POA: Diagnosis present

## 2016-07-10 DIAGNOSIS — J810 Acute pulmonary edema: Secondary | ICD-10-CM

## 2016-07-10 DIAGNOSIS — R0602 Shortness of breath: Secondary | ICD-10-CM | POA: Diagnosis present

## 2016-07-10 DIAGNOSIS — K5909 Other constipation: Secondary | ICD-10-CM | POA: Diagnosis present

## 2016-07-10 DIAGNOSIS — R0609 Other forms of dyspnea: Secondary | ICD-10-CM

## 2016-07-10 DIAGNOSIS — R5383 Other fatigue: Secondary | ICD-10-CM | POA: Diagnosis not present

## 2016-07-10 DIAGNOSIS — R0600 Dyspnea, unspecified: Secondary | ICD-10-CM

## 2016-07-10 DIAGNOSIS — R062 Wheezing: Secondary | ICD-10-CM | POA: Diagnosis not present

## 2016-07-10 DIAGNOSIS — Z955 Presence of coronary angioplasty implant and graft: Secondary | ICD-10-CM | POA: Insufficient documentation

## 2016-07-10 DIAGNOSIS — I5033 Acute on chronic diastolic (congestive) heart failure: Secondary | ICD-10-CM | POA: Diagnosis not present

## 2016-07-10 DIAGNOSIS — R0601 Orthopnea: Secondary | ICD-10-CM | POA: Diagnosis not present

## 2016-07-10 DIAGNOSIS — Z79899 Other long term (current) drug therapy: Secondary | ICD-10-CM | POA: Diagnosis not present

## 2016-07-10 DIAGNOSIS — I480 Paroxysmal atrial fibrillation: Secondary | ICD-10-CM | POA: Diagnosis present

## 2016-07-10 DIAGNOSIS — R05 Cough: Secondary | ICD-10-CM | POA: Diagnosis present

## 2016-07-10 DIAGNOSIS — I110 Hypertensive heart disease with heart failure: Principal | ICD-10-CM | POA: Insufficient documentation

## 2016-07-10 DIAGNOSIS — I4891 Unspecified atrial fibrillation: Secondary | ICD-10-CM | POA: Diagnosis not present

## 2016-07-10 DIAGNOSIS — I509 Heart failure, unspecified: Secondary | ICD-10-CM

## 2016-07-10 HISTORY — DX: Chronic pulmonary edema: J81.1

## 2016-07-10 LAB — URINALYSIS, ROUTINE W REFLEX MICROSCOPIC
Bacteria, UA: NONE SEEN
Bilirubin Urine: NEGATIVE
Glucose, UA: NEGATIVE mg/dL
Ketones, ur: NEGATIVE mg/dL
Nitrite: NEGATIVE
Protein, ur: NEGATIVE mg/dL
Specific Gravity, Urine: 1.004 — ABNORMAL LOW (ref 1.005–1.030)
pH: 6 (ref 5.0–8.0)

## 2016-07-10 LAB — BASIC METABOLIC PANEL
Anion gap: 8 (ref 5–15)
BUN: 10 mg/dL (ref 6–20)
CO2: 24 mmol/L (ref 22–32)
Calcium: 9 mg/dL (ref 8.9–10.3)
Chloride: 104 mmol/L (ref 101–111)
Creatinine, Ser: 0.9 mg/dL (ref 0.44–1.00)
GFR calc Af Amer: >60 mL/min (ref >60)
GFR calc non Af Amer: 58 mL/min — ABNORMAL LOW (ref >60)
Glucose, Bld: 106 mg/dL — ABNORMAL HIGH (ref 65–99)
Potassium: 4.1 mmol/L (ref 3.5–5.1)
Sodium: 136 mmol/L (ref 135–145)

## 2016-07-10 LAB — CBC
HCT: 35.2 % — ABNORMAL LOW (ref 36.0–46.0)
Hemoglobin: 11.6 g/dL — ABNORMAL LOW (ref 12.0–15.0)
MCH: 30 pg (ref 26.0–34.0)
MCHC: 33 g/dL (ref 30.0–36.0)
MCV: 91 fL (ref 78.0–100.0)
Platelets: 356 10*3/uL (ref 150–400)
RBC: 3.87 MIL/uL (ref 3.87–5.11)
RDW: 14.7 % (ref 11.5–15.5)
WBC: 7.2 10*3/uL (ref 4.0–10.5)

## 2016-07-10 LAB — POC OCCULT BLOOD, ED: Fecal Occult Bld: NEGATIVE

## 2016-07-10 LAB — BRAIN NATRIURETIC PEPTIDE: B Natriuretic Peptide: 253.3 pg/mL — ABNORMAL HIGH (ref 0.0–100.0)

## 2016-07-10 LAB — TROPONIN I
Troponin I: <0.03 ng/mL (ref <0.03)
Troponin I: <0.03 ng/mL (ref <0.03)

## 2016-07-10 MED ORDER — HYDRALAZINE HCL 20 MG/ML IJ SOLN: 10.0000 mg | Freq: Three times a day (TID) | INTRAMUSCULAR | Status: DC | PRN

## 2016-07-10 MED ORDER — FUROSEMIDE 10 MG/ML IJ SOLN
20.0000 mg | Freq: Once | INTRAMUSCULAR | Status: AC
  Administered 2016-07-10: 20 mg via INTRAVENOUS
  Filled 2016-07-10: qty 2

## 2016-07-10 MED ORDER — ONDANSETRON HCL 4 MG/2ML IJ SOLN: 4.0000 mg | Freq: Four times a day (QID) | INTRAMUSCULAR | Status: DC | PRN

## 2016-07-10 MED ORDER — RIVAROXABAN 20 MG PO TABS: 20.0000 mg | ORAL_TABLET | Freq: Every day | ORAL | Status: DC

## 2016-07-10 MED ORDER — VERAPAMIL HCL ER 240 MG PO TBCR
240.0000 mg | EXTENDED_RELEASE_TABLET | Freq: Every day | ORAL | Status: DC
  Administered 2016-07-11 – 2016-07-12 (×2): 240 mg via ORAL
  Filled 2016-07-10 (×2): qty 1

## 2016-07-10 MED ORDER — METOPROLOL SUCCINATE ER 25 MG PO TB24
25.0000 mg | ORAL_TABLET | Freq: Every day | ORAL | Status: DC
  Administered 2016-07-11 – 2016-07-12 (×2): 25 mg via ORAL
  Filled 2016-07-10 (×2): qty 1

## 2016-07-10 MED ORDER — POLYETHYLENE GLYCOL 3350 17 GM/SCOOP PO POWD
17.0000 g | ORAL | Status: DC | PRN
  Filled 2016-07-10: qty 255

## 2016-07-10 MED ORDER — ALBUTEROL SULFATE (2.5 MG/3ML) 0.083% IN NEBU
5.0000 mg | INHALATION_SOLUTION | Freq: Once | RESPIRATORY_TRACT | Status: DC
  Filled 2016-07-10: qty 6

## 2016-07-10 MED ORDER — IRBESARTAN 150 MG PO TABS
150.0000 mg | ORAL_TABLET | Freq: Every day | ORAL | Status: DC
  Administered 2016-07-10 – 2016-07-11 (×2): 150 mg via ORAL
  Filled 2016-07-10 (×3): qty 1

## 2016-07-10 MED ORDER — ACETAMINOPHEN 650 MG RE SUPP: 650.0000 mg | Freq: Four times a day (QID) | RECTAL | Status: DC | PRN

## 2016-07-10 MED ORDER — SODIUM CHLORIDE 0.9% FLUSH
3.0000 mL | Freq: Two times a day (BID) | INTRAVENOUS | Status: DC
  Administered 2016-07-10 – 2016-07-12 (×3): 3 mL via INTRAVENOUS

## 2016-07-10 MED ORDER — ONDANSETRON HCL 4 MG PO TABS: 4.0000 mg | ORAL_TABLET | Freq: Four times a day (QID) | ORAL | Status: DC | PRN

## 2016-07-10 MED ORDER — ACETAMINOPHEN 325 MG PO TABS
650.0000 mg | ORAL_TABLET | Freq: Four times a day (QID) | ORAL | Status: DC | PRN
  Administered 2016-07-10 – 2016-07-11 (×2): 650 mg via ORAL
  Filled 2016-07-10 (×2): qty 2

## 2016-07-10 MED ORDER — POTASSIUM CHLORIDE CRYS ER 20 MEQ PO TBCR
20.0000 meq | EXTENDED_RELEASE_TABLET | Freq: Every day | ORAL | Status: DC
  Administered 2016-07-10 – 2016-07-12 (×3): 20 meq via ORAL
  Filled 2016-07-10 (×3): qty 1

## 2016-07-10 MED ORDER — LORAZEPAM 0.5 MG PO TABS
0.5000 mg | ORAL_TABLET | Freq: Two times a day (BID) | ORAL | Status: DC | PRN
  Administered 2016-07-10 – 2016-07-11 (×3): 0.5 mg via ORAL
  Filled 2016-07-10 (×3): qty 1

## 2016-07-10 MED ORDER — RIVAROXABAN 15 MG PO TABS
15.0000 mg | ORAL_TABLET | Freq: Every day | ORAL | Status: DC
  Administered 2016-07-10 – 2016-07-11 (×2): 15 mg via ORAL
  Filled 2016-07-10 (×3): qty 1

## 2016-07-10 MED ORDER — FUROSEMIDE 10 MG/ML IJ SOLN
20.0000 mg | Freq: Two times a day (BID) | INTRAMUSCULAR | Status: DC
  Administered 2016-07-10: 20 mg via INTRAVENOUS
  Filled 2016-07-10 (×2): qty 2

## 2016-07-10 MED ORDER — POLYETHYLENE GLYCOL 3350 17 G PO PACK
17.0000 g | PACK | ORAL | Status: DC | PRN
  Administered 2016-07-10: 17 g via ORAL
  Filled 2016-07-10: qty 1

## 2016-07-10 NOTE — ED Notes (Signed)
Pts family requesting dinner tray for pt, explained no diet order at this time but will request from admitting additional orders.

## 2016-07-10 NOTE — ED Notes (Signed)
MD Jacubowitz at the bedside   

## 2016-07-10 NOTE — Progress Notes (Signed)
Subjective:  Presents with her daughter for complaints of extreme weakness and fatigue following a recent hospitalization for hyponatremia. C/o progressive nausea, weakness and SOB, worse since hospitalization. Was given large amounts of IV fluid. Had edema which has greatly improved. SOB with exertion. Severe dyspnea, cough and wheezing when she lies down at night. Nausea has resolved. Had temp of 99.5 one day but resolved. Was seen for UTI by gyn; completed antibiotics. No urinary symptoms at this time. Had constipation during hospitalization but this has resolved.   Objective:   BP 130/78   Ht 5\' 1"  (1.549 m)   Wt 142 lb 6.4 oz (64.6 kg)   BMI 26.91 kg/m  NAD. Alert, oriented. Fatigued in appearance. Normal color. No tachypnea. Lungs faint scattered expiratory crackles anterior; one faint expiratory wheeze upper left lobe posterior; otherwise clear. Heart RRR.   Assessment:   Problem List Items Addressed This Visit      Other   Hyponatremia - Primary    Other Visit Diagnoses    Fatigue, unspecified type       Orthopnea       Dyspnea on exertion       Wheezing           Plan:  Due to age and significant decline in patient's status, recommend that her daughter take [facility] for [facility]. They agree to this plan.

## 2016-07-10 NOTE — ED Notes (Signed)
Pts family at nurses station requesting diet order for patient again, explained to family we are still waiting on orders from admitting.

## 2016-07-10 NOTE — ED Triage Notes (Signed)
Pt sent to ED for eval of increased weakness and sob over the past few weeks. Pt recently admitted at [facility] with same and found to have low sodium levels. Treated and was at MD office this am for further testing. Pt has become weaker and weaker since admit per family so sent to ED. Pt alert, no pain, appears weak

## 2016-07-10 NOTE — ED Provider Notes (Signed)
[facility]-EMERGENCY DEPT Provider Note   CSN: [id number] Arrival date & time: 07/10/16  1104     History   Chief Complaint Chief Complaint  Patient presents with  . Shortness of Breath  . Weakness    HPI Katherine Lane is a 83 y.o. female.Complains of dyspnea with exertion and generalized weakness over the past 2 weeks. Marland Kitchen Dyspnea is also worse with lying supine and with exertion and improved with sitting upright and patient coughs with lying supine. She denies any fever. She was evaluated by her primary care physician's office this morning and sent here for further evaluation. No treatment prior to coming here  HPI  Past Medical History:  Diagnosis Date  . A-fib (HCC)   . Anxiety   . Arthritis   . Chronic back pain   . Constipation, chronic   . Depression   . HTN (hypertension)   . Insomnia   . Migraine headache   . Osteoporosis   . PONV (postoperative nausea and vomiting)   . Sodium (Na) deficiency   . Trigeminal neuralgia     Patient Active Problem List   Diagnosis Date Noted  . Migraine headache 07/01/2016  . Blurred vision, bilateral 07/01/2016  . Anxiety 06/30/2016  . Altered mental status 12/06/2015  . Hyponatremia 12/06/2015  . Hypokalemia 12/06/2015  . Dehydration 12/06/2015  . Prolonged Q-T interval on ECG 12/06/2015  . Postural dizziness with presyncope 12/06/2015  . Atrial fibrillation (HCC) 07/14/2013  . Atrial flutter (HCC) 05/12/2013  . Bulging discs 04/20/2013  . Trigeminal neuralgia 01/15/2013  . Cellulitis 07/31/2011  . Mechanical complication of internal orthopedic implant (HCC) 07/21/2011  . 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 Ankle fracture 07/02/2010  . LESION OF PLANTAR NERVE 05/14/2009  . CLOSED BIMALLEOLAR FRACTURE 03/01/2009  . Essential hypertension 11/07/2008  . CONSTIPATION, CHRONIC 11/07/2008  . HEMATOCHEZIA 11/07/2008  . Osteoporosis 11/07/2008    Past Surgical History:  Procedure Laterality Date  . ANKLE  FRACTURE SURGERY     2010-right  . CARDIAC CATHETERIZATION     denies  . CATARACT EXTRACTION W/PHACO Right 07/22/2012   Procedure: CATARACT EXTRACTION PHACO AND INTRAOCULAR LENS PLACEMENT (IOC);  Surgeon: Gemma Payor, MD;  Location: [facility];  Service: Ophthalmology;  Laterality: Right;  CDE:  18.93  . CATARACT EXTRACTION W/PHACO Left 08/09/2012   Procedure: CATARACT EXTRACTION PHACO AND INTRAOCULAR LENS PLACEMENT (IOC);  Surgeon: Gemma Payor, MD;  Location: [facility];  Service: Ophthalmology;  Laterality: Left;  CDE: 17.21  . COLONOSCOPY    . HARDWARE REMOVAL  07/18/2011   Procedure: HARDWARE REMOVAL;  Surgeon: Vickki Hearing, MD;  Location: [facility];  Service: Orthopedics;  Laterality: Right;  . NERVE REPAIR  07/18/2011   Procedure: NERVE REPAIR;  Surgeon: Vickki Hearing, MD;  Location: [facility];  Service: Orthopedics;  Laterality: Right;  Right leg superficial peroneal nerve release    . PARTIAL HYSTERECTOMY      OB History    Gravida Para Term Preterm AB Living   3 3 3     3    SAB TAB Ectopic Multiple Live Births           3       Home Medications    Prior to Admission medications   Medication Sig Start Date End Date Taking? Authorizing Provider  ANALPRAM-HC 1-1 % rectal cream APPLY TO AFFECTED AREAS TWICE DAILY. 07/01/16   Adline Potter, NP  LORazepam (ATIVAN) 0.5 MG tablet TAKE ONE TABLET DAILY AS  NEEDED. 05/30/16   Babs Sciara, MD  metoprolol succinate (TOPROL-XL) 25 MG 24 hr tablet TAKE ONE TABLET BY MOUTH ONCE DAILY. Patient taking differently: take one tablet by mouth once daily 04/18/16   Laqueta Linden, MD  nystatin (MYCOSTATIN/NYSTOP) powder Apply topically 4 (four) times daily. 06/20/16   Adline Potter, NP  ondansetron (ZOFRAN-ODT) 4 MG disintegrating tablet Take 4 mg by mouth as needed.     [provider]  polyethylene glycol powder (MIRALAX) powder Take 17 g by mouth as needed.     [provider]  potassium chloride SA (K-DUR,KLOR-CON)  20 MEQ tablet Take 1 tablet (20 mEq total) by mouth daily. 04/14/16   Jodelle Gross, NP  valsartan (DIOVAN) 160 MG tablet Take 1 tablet (160 mg total) by mouth daily. 05/05/16   Merlyn Albert, MD  verapamil (CALAN-SR) 240 MG CR tablet TAKE (1) TABLET BY MOUTH EACH MORNING. 03/03/16   Merlyn Albert, MD  XARELTO 20 MG TABS tablet TAKE 1 TABLET DAILY WITH SUPPER. 03/20/16   Laqueta Linden, MD  zolpidem (AMBIEN) 10 MG tablet TAKE 1 TABLET BY MOUTH AT BEDTIME FOR SLEEP. 04/01/16   Merlyn Albert, MD    Family History Family History  Problem Relation Age of Onset  . Hypertension Mother   . Alzheimer's disease Mother   . Cancer Mother   . Hypertension Father   . Other Father        abused pain meds  . Diabetes Brother   . Other Brother        back problems  . Diabetes Daughter   . Anesthesia problems Neg Hx   . Hypotension Neg Hx   . Malignant hyperthermia Neg Hx   . Pseudochol deficiency Neg Hx     Social History Social History  Substance Use Topics  . Smoking status: Never Smoker  . Smokeless tobacco: Never Used  . Alcohol use No     Allergies   Codeine and Tramadol   Review of Systems Review of Systems  HENT: Negative.   Respiratory: Positive for shortness of breath.   Cardiovascular: Negative.   Gastrointestinal: Positive for anal bleeding.       Occasional red blood from rectum from her hemorrhoids  Musculoskeletal: Negative.   Skin: Negative.   Neurological: Positive for weakness.  Psychiatric/Behavioral: Negative.   All other systems reviewed and are negative.    Physical Exam Updated Vital Signs BP 129/72   Pulse 66   Temp 97.7 F (36.5 C) (Oral)   Resp 20   SpO2 93%   Physical Exam  Constitutional: She appears well-developed and well-nourished.  HENT:  Head: Normocephalic and atraumatic.  Eyes: Conjunctivae are normal. Pupils are equal, round, and reactive to light.  Neck: Neck supple. JVD present. No tracheal deviation present.  No thyromegaly present.  Cardiovascular: Normal rate.   No murmur heard. Irregularly irregular  Pulmonary/Chest: Effort normal and breath sounds normal.  Abdominal: Soft. Bowel sounds are normal. She exhibits no distension. There is no tenderness.  Genitourinary: Rectal exam shows guaiac negative stool.  Genitourinary Comments: Rectal normal tone no gross blood external hemorrhoids present, nonthrombosed  Musculoskeletal: Normal range of motion. She exhibits no edema or tenderness.  Neurological: She is alert. Coordination normal.  Skin: Skin is warm and dry. No rash noted.  Psychiatric: She has a normal mood and affect.  Nursing note and vitals reviewed.    ED Treatments / Results  Labs (all labs ordered are  listed, but only abnormal results are displayed) Labs Reviewed  CBC - Abnormal; Notable for the following:       Result Value   Hemoglobin 11.6 (*)    HCT 35.2 (*)    All other components within normal limits  BASIC METABOLIC PANEL  URINALYSIS, ROUTINE W REFLEX MICROSCOPIC  BRAIN NATRIURETIC PEPTIDE  TROPONIN I  POC OCCULT BLOOD, ED   . EKG  EKG Interpretation  Date/Time:  Thursday Jul 10 2016 11:09:41 EDT Ventricular Rate:  69 PR Interval:    QRS Duration: 72 QT Interval:  380 QTC Calculation: 407 R Axis:   79 Text Interpretation:  Atrial fibrillation Anteroseptal infarct , age undetermined Abnormal ECG No significant change since last tracing Confirmed by Doug SouJacubowitz, Keelyn Fjelstad [phone]([id number]) on 07/10/2016 12:11:15 PM      Results for orders placed or performed during the hospital encounter of 07/10/16  Basic metabolic panel  Result Value Ref Range   Sodium 136 135 - 145 mmol/L   Potassium 4.1 3.5 - 5.1 mmol/L   Chloride 104 101 - 111 mmol/L   CO2 24 22 - 32 mmol/L   Glucose, Bld 106 (H) 65 - 99 mg/dL   BUN 10 6 - 20 mg/dL   Creatinine, Ser 6.040.90 0.44 - 1.00 mg/dL   Calcium 9.0 8.9 - 54.010.3 mg/dL   GFR calc non Af Amer 58 (L) >60 mL/min   GFR calc Af Amer >60 >60 mL/min    Anion gap 8 5 - 15  CBC  Result Value Ref Range   WBC 7.2 4.0 - 10.5 K/uL   RBC 3.87 3.87 - 5.11 MIL/uL   Hemoglobin 11.6 (L) 12.0 - 15.0 g/dL   HCT 98.135.2 (L) 19.136.0 - 47.846.0 %   MCV 91.0 78.0 - 100.0 fL   MCH 30.0 26.0 - 34.0 pg   MCHC 33.0 30.0 - 36.0 g/dL   RDW 29.514.7 62.111.5 - 30.815.5 %   Platelets 356 150 - 400 K/uL  Urinalysis, Routine w reflex microscopic  Result Value Ref Range   Color, Urine STRAW (A) YELLOW   APPearance CLEAR CLEAR   Specific Gravity, Urine 1.004 (L) 1.005 - 1.030   pH 6.0 5.0 - 8.0   Glucose, UA NEGATIVE NEGATIVE mg/dL   Hgb urine dipstick SMALL (A) NEGATIVE   Bilirubin Urine NEGATIVE NEGATIVE   Ketones, ur NEGATIVE NEGATIVE mg/dL   Protein, ur NEGATIVE NEGATIVE mg/dL   Nitrite NEGATIVE NEGATIVE   Leukocytes, UA TRACE (A) NEGATIVE   RBC / HPF 0-5 0 - 5 RBC/hpf   WBC, UA 0-5 0 - 5 WBC/hpf   Bacteria, UA NONE SEEN NONE SEEN   Squamous Epithelial / LPF 0-5 (A) NONE SEEN  Brain natriuretic peptide  Result Value Ref Range   B Natriuretic Peptide 253.3 (H) 0.0 - 100.0 pg/mL  Troponin I  Result Value Ref Range   Troponin I <0.03 <0.03 ng/mL  POC occult blood, ED Provider will collect  Result Value Ref Range   Fecal Occult Bld NEGATIVE NEGATIVE  Chest x-ray viewed by me Dg Chest 2 View  Result Date: 07/10/2016 CLINICAL DATA:  Feeling very weak for few days and sob with exertion EXAM: CHEST  2 VIEW COMPARISON:  CT of the chest 05/11/2013, chest x-ray 04/11/2016 FINDINGS: Heart is mildly enlarged. There are interstitial changes consistent with interstitial pulmonary edema for chief within the perihilar and basilar regions. Small bilateral pleural effusions are present. No focal consolidations are identified. Right upper lobe granuloma is again identified. IMPRESSION: Mild  interstitial pulmonary edema. Electronically Signed   By: Norva Pavlov M.D.   On: 07/10/2016 12:41   Dg Abdomen 1 View  Result Date: 06/30/2016 CLINICAL DATA:  Constipation today.  Nausea.  EXAM: ABDOMEN - 1 VIEW COMPARISON:  None. FINDINGS: Moderate stool burden in the colon. Small volume of rectal stool. No dilated bowel to suggest obstruction. No evidence of free air. Multiple pelvic phleboliths. Calcified rib cartilage in the upper abdomen. No discrete radiopaque calculi. Mild scoliosis and degenerative change in the spine. IMPRESSION: Moderate stool burden throughout the colon. No evidence of obstruction. Electronically Signed   By: Rubye Oaks M.D.   On: 06/30/2016 23:33   Ct Head Wo Contrast  Result Date: 06/30/2016 CLINICAL DATA:  Initial evaluation for acute blurry vision, nausea. EXAM: CT HEAD WITHOUT CONTRAST TECHNIQUE: Contiguous axial images were obtained from the base of the skull through the vertex without intravenous contrast. COMPARISON:  Prior CT from 12/06/2015. FINDINGS: Brain: Generalized cerebral atrophy with moderate chronic microvascular ischemic disease. No acute intracranial hemorrhage. No evidence for acute large vessel territory infarct. No mass lesion, midline shift or mass effect. No hydrocephalus. No extra-axial fluid collection. Vascular: No hyperdense vessel. Scattered vascular calcifications noted within the carotid siphons. Skull: Scalp soft tissues within normal limits.  Calvarium intact. Sinuses/Orbits: Globes and orbital soft tissues within normal limits. Other: Visualized paranasal sinuses are largely clear. No mastoid effusion. IMPRESSION: 1. No acute intracranial process identified. 2. Generalized age-related cerebral atrophy with moderate chronic microvascular disease. Electronically Signed   By: Rise Mu M.D.   On: 06/30/2016 22:18    Radiology No results found.  Procedures Procedures (including critical care time)  Medications Ordered in ED Medications - No data to display  Chest x-ray viewed by me . Initial Impression / Assessment and Plan / ED Course  I have reviewed the triage vital signs and the nursing  notes.  Pertinent labs & imaging results that were available during my care of the patient were reviewed by me and considered in my medical decision making (see chart for details).     As of 3:30 PM patient had diuresis of 2 L after treatment with intravenous Lasix. Hospitalist service consulted and will arrange for overnight stay. Symptoms and exam consistent with congestive heart failure likely secondary to fluid overload from prior hospitalization  Final Clinical Impressions(s) / ED Diagnoses  Diagnosis congestive heart Final diagnoses:  None    New Prescriptions New Prescriptions   No medications on file     Doug Sou, MD 07/10/16 [phone]

## 2016-07-10 NOTE — H&P (Signed)
[facility] History and Physical  Katherine Lane ZOX:[id number] DOB: 12/08/1932 DOA: 07/10/2016  Referring physician:  PCP: Merlyn Albert, MD   Chief Complaint: "Her doctor took one look at her and sent her over."-Daughter  HPI: Katherine Lane is a 83 y.o. female  with past history of atrial fibrillation, anxiety, chronic constipation, depression, hypertension, insomnia, migraine headache, sodium deficiency and trigeminal neuralgia presents with chief complaint of weakness. Patient states that since her recent hospitalization she has been weak. No other exhalation. No chest pain or diaphoresis. Patient went to her physician for evaluation due to this weakness was told to come to the emergency room.  In the ED: Patient's x-ray show pulmonary edema. Given Lasix. Very effective. Hospitalist consulted for admission.   Review of Systems:  As per HPI otherwise 10 point review of systems negative.    Past Medical History:  Diagnosis Date  . A-fib (HCC)   . Anxiety   . Arthritis   . Chronic back pain   . Constipation, chronic   . Depression   . HTN (hypertension)   . Insomnia   . Migraine headache   . Osteoporosis   . PONV (postoperative nausea and vomiting)   . Sodium (Na) deficiency   . Trigeminal neuralgia    Past Surgical History:  Procedure Laterality Date  . ANKLE FRACTURE SURGERY     2010-right  . CARDIAC CATHETERIZATION     denies  . CATARACT EXTRACTION W/PHACO Right 07/22/2012   Procedure: CATARACT EXTRACTION PHACO AND INTRAOCULAR LENS PLACEMENT (IOC);  Surgeon: Gemma Payor, MD;  Location: [facility];  Service: Ophthalmology;  Laterality: Right;  CDE:  18.93  . CATARACT EXTRACTION W/PHACO Left 08/09/2012   Procedure: CATARACT EXTRACTION PHACO AND INTRAOCULAR LENS PLACEMENT (IOC);  Surgeon: Gemma Payor, MD;  Location: [facility];  Service: Ophthalmology;  Laterality: Left;  CDE: 17.21  . COLONOSCOPY    . HARDWARE REMOVAL  07/18/2011   Procedure: HARDWARE REMOVAL;  Surgeon:  Vickki Hearing, MD;  Location: [facility];  Service: Orthopedics;  Laterality: Right;  . NERVE REPAIR  07/18/2011   Procedure: NERVE REPAIR;  Surgeon: Vickki Hearing, MD;  Location: [facility];  Service: Orthopedics;  Laterality: Right;  Right leg superficial peroneal nerve release    . PARTIAL HYSTERECTOMY     Social History:  reports that she has never smoked. She has never used smokeless tobacco. She reports that she does not drink alcohol or use drugs.  Allergies  Allergen Reactions  . Codeine Nausea And Vomiting    Patient states intolerance to all pain medications  . Tramadol     Family History  Problem Relation Age of Onset  . Hypertension Mother   . Alzheimer's disease Mother   . Cancer Mother   . Hypertension Father   . Other Father        abused pain meds  . Diabetes Brother   . Other Brother        back problems  . Diabetes Daughter   . Anesthesia problems Neg Hx   . Hypotension Neg Hx   . Malignant hyperthermia Neg Hx   . Pseudochol deficiency Neg Hx      Prior to Admission medications   Medication Sig Start Date End Date Taking? Authorizing Provider  ANALPRAM-HC 1-1 % rectal cream APPLY TO AFFECTED AREAS TWICE DAILY. 07/01/16   Adline Potter, NP  LORazepam (ATIVAN) 0.5 MG tablet TAKE ONE TABLET DAILY AS NEEDED. 05/30/16   Babs Sciara,  MD  metoprolol succinate (TOPROL-XL) 25 MG 24 hr tablet TAKE ONE TABLET BY MOUTH ONCE DAILY. Patient taking differently: take one tablet by mouth once daily 04/18/16   Laqueta LindenKoneswaran, Suresh A, MD  nystatin (MYCOSTATIN/NYSTOP) powder Apply topically 4 (four) times daily. 06/20/16   Adline PotterGriffin, Jennifer A, NP  ondansetron (ZOFRAN-ODT) 4 MG disintegrating tablet Take 4 mg by mouth as needed.     [provider]  polyethylene glycol powder (MIRALAX) powder Take 17 g by mouth as needed.     [provider]  potassium chloride SA (K-DUR,KLOR-CON) 20 MEQ tablet Take 1 tablet (20 mEq total) by mouth daily. 04/14/16   Jodelle GrossLawrence,  Kathryn M, NP  valsartan (DIOVAN) 160 MG tablet Take 1 tablet (160 mg total) by mouth daily. 05/05/16   Merlyn AlbertLuking, William S, MD  verapamil (CALAN-SR) 240 MG CR tablet TAKE (1) TABLET BY MOUTH EACH MORNING. 03/03/16   Merlyn AlbertLuking, William S, MD  XARELTO 20 MG TABS tablet TAKE 1 TABLET DAILY WITH SUPPER. 03/20/16   Laqueta LindenKoneswaran, Suresh A, MD  zolpidem (AMBIEN) 10 MG tablet TAKE 1 TABLET BY MOUTH AT BEDTIME FOR SLEEP. 04/01/16   Merlyn AlbertLuking, William S, MD   Physical Exam: Vitals:   07/10/16 1342 07/10/16 1400 07/10/16 1430 07/10/16 1500  BP: 126/69 130/67 130/68 125/70  Pulse: 63 76 78 79  Resp: (!) 27 (!) 23 (!) 25   Temp:      TempSrc:      SpO2: 92% 94% 93% 92%    Wt Readings from Last 3 Encounters:  07/10/16 64.6 kg (142 lb 6.4 oz)  07/04/16 66.5 kg (146 lb 8 oz)  07/01/16 62.6 kg (138 lb)    General:  Appears calm and comfortable Eyes:  PERRL, EOMI, normal lids, iris ENT:  grossly normal hearing, lips & tongue Neck:  no LAD, masses or thyromegaly Cardiovascular:  RRR, no m/r/g. No LE edema.  Respiratory:  Tachypnea, use of accessory muscles, no WRR Abdomen:  soft, ntnd Skin:  no rash or induration seen on limited exam Musculoskeletal:  grossly normal tone BUE/BLE Psychiatric:  grossly normal mood and affect, speech fluent and appropriate Neurologic:  CN 2-12 grossly intact, moves all extremities in coordinated fashion.          Labs on Admission:  Basic Metabolic Panel:  Recent Labs Lab 07/10/16 1119  NA 136  K 4.1  CL 104  CO2 24  GLUCOSE 106*  BUN 10  CREATININE 0.90  CALCIUM 9.0   Liver Function Tests: No results for input(s): AST, ALT, ALKPHOS, BILITOT, PROT, ALBUMIN in the last 168 hours. No results for input(s): LIPASE, AMYLASE in the last 168 hours. No results for input(s): AMMONIA in the last 168 hours. CBC:  Recent Labs Lab 07/10/16 1119  WBC 7.2  HGB 11.6*  HCT 35.2*  MCV 91.0  PLT 356   Cardiac Enzymes:  Recent Labs Lab 07/10/16 1119  TROPONINI <0.03      BNP (last 3 results)  Recent Labs  07/10/16 1119  BNP 253.3*    ProBNP (last 3 results) No results for input(s): PROBNP in the last 8760 hours.   Serum creatinine: 0.9 mg/dL 16/11/9603/24/18 [id number] Estimated creatinine clearance: 42.2 mL/min  CBG: No results for input(s): GLUCAP in the last 168 hours.  Radiological Exams on Admission: Dg Chest 2 View  Result Date: 07/10/2016 CLINICAL DATA:  Feeling very weak for few days and sob with exertion EXAM: CHEST  2 VIEW COMPARISON:  CT of the chest 05/11/2013, chest x-ray  04/11/2016 FINDINGS: Heart is mildly enlarged. There are interstitial changes consistent with interstitial pulmonary edema for chief within the perihilar and basilar regions. Small bilateral pleural effusions are present. No focal consolidations are identified. Right upper lobe granuloma is again identified. IMPRESSION: Mild interstitial pulmonary edema. Electronically Signed   By: Norva Pavlov M.D.   On: 07/10/2016 12:41    EKG: Independently reviewed. Atrial fibrillation, T-wave flattening seen in lead aVL when compared to May 15 EKG; no STEMI  Assessment/Plan Principal Problem:   Pulmonary edema Active Problems:   Essential hypertension   CONSTIPATION, CHRONIC   Atrial fibrillation (HCC)  Pulm Edema Got fluid on recent admit for Na correction Cont IV lasix Strict I&O ECHO ordered Initial trop ordered neg, checking repeat Bipap prn RT consult  Hypertension When necessary hydralazine 10 mg IV as needed for severe blood pressure Con avapro, toprol  Chronic constipation MiraLAX when necessary  Anxiety Ativan prn  Afib Xarelto qpm  Depression No SI/HI  Code Status: FULL DVT Prophylaxis: Xarelto Family Communication: at bedside Disposition Plan: Pending Improvement  Status: obs sdu  Haydee Salter, MD Family Medicine [facility] [email or web address] Password [email or web address]

## 2016-07-10 NOTE — ED Notes (Addendum)
Spoke with Dr. Melynda RippleHobbs, requested orders Asked for diet order per family request, MD to put in orders

## 2016-07-11 ENCOUNTER — Encounter (HOSPITAL_COMMUNITY): Payer: Self-pay | Admitting: General Practice

## 2016-07-11 ENCOUNTER — Observation Stay (HOSPITAL_COMMUNITY): Payer: Medicare Other

## 2016-07-11 DIAGNOSIS — I5033 Acute on chronic diastolic (congestive) heart failure: Secondary | ICD-10-CM | POA: Diagnosis not present

## 2016-07-11 DIAGNOSIS — R053 Chronic cough: Secondary | ICD-10-CM | POA: Diagnosis present

## 2016-07-11 DIAGNOSIS — R05 Cough: Secondary | ICD-10-CM | POA: Diagnosis present

## 2016-07-11 LAB — CBC
HCT: 32.9 % — ABNORMAL LOW (ref 36.0–46.0)
Hemoglobin: 10.6 g/dL — ABNORMAL LOW (ref 12.0–15.0)
MCH: 29.3 pg (ref 26.0–34.0)
MCHC: 32.2 g/dL (ref 30.0–36.0)
MCV: 90.9 fL (ref 78.0–100.0)
Platelets: 343 10*3/uL (ref 150–400)
RBC: 3.62 MIL/uL — ABNORMAL LOW (ref 3.87–5.11)
RDW: 14.6 % (ref 11.5–15.5)
WBC: 6.1 10*3/uL (ref 4.0–10.5)

## 2016-07-11 LAB — BASIC METABOLIC PANEL
Anion gap: 8 (ref 5–15)
BUN: 9 mg/dL (ref 6–20)
CO2: 27 mmol/L (ref 22–32)
Calcium: 8.5 mg/dL — ABNORMAL LOW (ref 8.9–10.3)
Chloride: 102 mmol/L (ref 101–111)
Creatinine, Ser: 0.92 mg/dL (ref 0.44–1.00)
GFR calc Af Amer: >60 mL/min (ref >60)
GFR calc non Af Amer: 57 mL/min — ABNORMAL LOW (ref >60)
Glucose, Bld: 99 mg/dL (ref 65–99)
Potassium: 3.5 mmol/L (ref 3.5–5.1)
Sodium: 137 mmol/L (ref 135–145)

## 2016-07-11 LAB — MAGNESIUM: Magnesium: 2 mg/dL (ref 1.7–2.4)

## 2016-07-11 MED ORDER — POTASSIUM CHLORIDE CRYS ER 20 MEQ PO TBCR
40.0000 meq | EXTENDED_RELEASE_TABLET | Freq: Once | ORAL | Status: AC
  Administered 2016-07-11: 40 meq via ORAL
  Filled 2016-07-11: qty 2

## 2016-07-11 MED ORDER — PANTOPRAZOLE SODIUM 40 MG PO TBEC
40.0000 mg | DELAYED_RELEASE_TABLET | Freq: Two times a day (BID) | ORAL | Status: DC
  Administered 2016-07-11 – 2016-07-12 (×2): 40 mg via ORAL
  Filled 2016-07-11 (×2): qty 1

## 2016-07-11 MED ORDER — FUROSEMIDE 10 MG/ML IJ SOLN
40.0000 mg | Freq: Once | INTRAMUSCULAR | Status: AC
  Administered 2016-07-11: 40 mg via INTRAVENOUS
  Filled 2016-07-11: qty 4

## 2016-07-11 NOTE — Progress Notes (Signed)
[facility] Progress Note  Patient: Katherine Lane WUJ:[id number]   PCP: Merlyn Albert, MD DOB: Oct 28, 1932   DOA: 07/10/2016   DOS: 07/11/2016   Date of Service: the patient was seen and examined on 07/11/2016  Subjective: feeling better but still has shortnes of breath, nausea and cough, has hoarseness of voice for more than 6 months.   Brief hospital course: Pt. with PMH of Chronic A. fib, depression, hypertension, chronic diastolic CHF; admitted on 07/10/2016, presented with complaint of shortness of breath and weight gain, was found to have acute on chronic diastolic CHF. Currently further plan is continue IV Lasix.  Assessment and Plan: 1. Acute on chronic diastolic CHF. Patient presented with complains of shortness of breath as well as weight gain. Patient gained roughly 4 pounds from her recent discharge and 6 pounds from her baseline. Currently 90% on room air with significant respiratory distress Patient received 40 mg Lasix just today, we will give 40 mg 1 today as well. Continue close monitoring on telemetry at present. Likely cause of acute worsening is lack of Diuril take at home after her recent admission with hyponatremia due to hypovolemia.  2. Essential hypertension. Continuing home regimen. Patient is on 3 antihypertensive regimen. We'll continue to monitor at present.  3. Chronic cough. We'll start the patient on Protonix twice a day for 2 weeks. Recently she was on Vasotec) January and has been switched to the ARB because of the cough. If her cough does not improve with 2 weeks of PPI, recommend ENT follow-up.  4. Recent admission for hyponatremia. Patient recently presented with hyponatremia, it was thought due to hydrochlorothiazide and she was given IV fluids. Currently I recommended to resume diuresis with Lasix at home but only when necessary.  5. Generalized weakness and deconditioning. PTOT consult.  6. Hypokalemia. Continue take potassium  replacement.  7. Right leg edema  Check doppler rule out DVT   Diet: cardiac diet DVT Prophylaxis: subcutaneous Heparin  Advance goals of care discussion: full code  Family Communication: family was present at bedside, at the time of interview. The pt provided permission to discuss medical plan with the family. Opportunity was given to ask question and all questions were answered satisfactorily.   Disposition:  Discharge to home.  Consultants: none Procedures: none  Antibiotics: Anti-infectives    None       Objective: Physical Exam: Vitals:   07/10/16 2132 07/11/16 0254 07/11/16 1008 07/11/16 1300  BP:  134/87 133/76 131/71  Pulse:  77 95 81  Resp:  (!) 23  18  Temp:  98 F (36.7 C)  98.2 F (36.8 C)  TempSrc:    Oral  SpO2: 90% 94%  96%  Weight:  62.8 kg (138 lb 6.4 oz)    Height:        Intake/Output Summary (Last 24 hours) at 07/11/16 1359 Last data filed at 07/11/16 1000  Gross per 24 hour  Intake              843 ml  Output             3400 ml  Net            -2557 ml   Filed Weights   07/11/16 0254  Weight: 62.8 kg (138 lb 6.4 oz)   General: Alert, Awake and Oriented to Time, Place and Person. Appear in moderate distress, affect appropriate Eyes: PERRL, Conjunctiva normal ENT: Oral Mucosa clear moist Neck: no JVD, no Abnormal Mass  Or lumps Cardiovascular: S1 and S2 Present, no Murmur, Respiratory: Bilateral Air entry equal and Decreased, no use of accessory muscle, basal Crackles, no wheezes Abdomen: Bowel Sound present, Soft and no tenderness Skin: no redness, no Rash, no induration Extremities: right Pedal edema, no calf tenderness Neurologic: Grossly no focal neuro deficit. Bilaterally Equal motor strength  Data Reviewed: CBC:  Recent Labs Lab 07/10/16 1119 07/11/16 0352  WBC 7.2 6.1  HGB 11.6* 10.6*  HCT 35.2* 32.9*  MCV 91.0 90.9  PLT 356 343   Basic Metabolic Panel:  Recent Labs Lab 07/10/16 1119 07/11/16 0352  NA 136 137    K 4.1 3.5  CL 104 102  CO2 24 27  GLUCOSE 106* 99  BUN 10 9  CREATININE 0.90 0.92  CALCIUM 9.0 8.5*  MG  --  2.0    Liver Function Tests: No results for input(s): AST, ALT, ALKPHOS, BILITOT, PROT, ALBUMIN in the last 168 hours. No results for input(s): LIPASE, AMYLASE in the last 168 hours. No results for input(s): AMMONIA in the last 168 hours. Coagulation Profile: No results for input(s): INR, PROTIME in the last 168 hours. Cardiac Enzymes:  Recent Labs Lab 07/10/16 1119 07/10/16 1617  TROPONINI <0.03 <0.03   BNP (last 3 results) No results for input(s): PROBNP in the last 8760 hours. CBG: No results for input(s): GLUCAP in the last 168 hours. Studies: X-ray Chest Pa And Lateral  Result Date: 07/11/2016 CLINICAL DATA:  Hoarseness.  Shortness of breath . EXAM: CHEST  2 VIEW COMPARISON:  07/10/2016. FINDINGS: Mediastinum hilar structures normal. Cardiomegaly with diffuse bilateral from interstitial prominence. Findings consistent with persistent interstitial edema. Right mid lung field calcified nodular densities most likely granulomas. Small bilateral pleural effusions. IMPRESSION: Findings consistent with persistent congestive heart failure with bilateral mild interstitial edema and small bilateral pleural effusions. Electronically Signed   By: Maisie Fushomas  Register   On: 07/11/2016 08:45    Scheduled Meds: . irbesartan  150 mg Oral Daily  . metoprolol succinate  25 mg Oral Daily  . potassium chloride SA  20 mEq Oral Daily  . rivaroxaban  15 mg Oral Q supper  . sodium chloride flush  3 mL Intravenous Q12H  . verapamil  240 mg Oral Daily   Continuous Infusions: PRN Meds: acetaminophen **OR** acetaminophen, hydrALAZINE, LORazepam, ondansetron **OR** ondansetron (ZOFRAN) IV, polyethylene glycol  Time spent: 35 minutes  Author: Lynden OxfordPranav Sherlon Nied, MD [facility] Pager: [phone] 07/11/2016 1:59 PM  If 7PM-7AM, please contact night-coverage at [email or web address], password  [email or web address]

## 2016-07-11 NOTE — Evaluation (Signed)
Physical Therapy Evaluation Patient Details Name: Katherine Lane MRN: [id number] DOB: [birth year] Today's Date: 07/11/2016   History of Present Illness  Pt is an 83 y/o female admitted secondary to weakness, x-ray revealed pulmonary edema. PMH including but not limited to a-fib and HTN.  Clinical Impression  Pt presented supine in bed with HOB elevated, awake and willing to participate in therapy session. Prior to admission, pt reported that she is very independent with all functional mobility and ADLs. She stated that she continues to drive as well. Pt's daughter present in the room throughout and expressed concerns regarding the pt's breathing. On RA at rest, pt's SPO2 remains >95%; however, with ambulation on RA, pt's SPO2 decreases to as low as 88% and pt with noted increased WOB, SOB and wheezing. Pt's RN was notified at the end of the session. PT will continue to follow pt acutely to ensure a safe d/c home.    Follow Up Recommendations No PT follow up;Supervision/Assistance - 24 hour    Equipment Recommendations  None recommended by PT    Recommendations for Other Services       Precautions / Restrictions Precautions Precautions: None Restrictions Weight Bearing Restrictions: No      Mobility  Bed Mobility Overal bed mobility: Modified Independent                Transfers Overall transfer level: Needs assistance Equipment used: None Transfers: Sit to/from Stand Sit to Stand: Supervision         General transfer comment: increased time, mild instability with transition into standing from bed but able to self-correct  Ambulation/Gait Ambulation/Gait assistance: Min guard Ambulation Distance (Feet): 50 Feet (50' x2 with standing rest break secondary to SOB, fatigue) Assistive device: None (frequently reaching for handrails) Gait Pattern/deviations: Step-through pattern;Decreased step length - right;Decreased step length - left;Decreased stride length;Trunk  flexed Gait velocity: decreased Gait velocity interpretation: Below normal speed for age/gender General Gait Details: pt with mild instability with gait, frequently reaching for the handrails in the hallway but no LOB or need for physical assistance. PT provided min guard for safety. Pt ambulated on RA with SPO2 decreasing to as low as 88%. pt also with noted SOB, increased WOB and wheezing.  Stairs            Wheelchair Mobility    Modified Rankin (Stroke Patients Only)       Balance Overall balance assessment: Needs assistance Sitting-balance support: Feet supported Sitting balance-Leahy Scale: Good     Standing balance support: During functional activity;No upper extremity supported Standing balance-Leahy Scale: Fair                               Pertinent Vitals/Pain Pain Assessment: No/denies pain    Home Living Family/patient expects to be discharged to:: Private residence Living Arrangements: Alone Available Help at Discharge: Family;Friend(s);Available PRN/intermittently Type of Home: House Home Access: Stairs to enter Entrance Stairs-Rails: Doctor, general practice of Steps: 2 Home Layout: One level Home Equipment: Walker - 2 wheels;Cane - single point;Shower seat      Prior Function Level of Independence: Independent               Hand Dominance   Dominant Hand: Right    Extremity/Trunk Assessment   Upper Extremity Assessment Upper Extremity Assessment: Overall WFL for tasks assessed    Lower Extremity Assessment Lower Extremity Assessment: Generalized weakness       Communication  Communication: No difficulties  Cognition Arousal/Alertness: Awake/alert Behavior During Therapy: WFL for tasks assessed/performed Overall Cognitive Status: Within Functional Limits for tasks assessed                                        General Comments      Exercises     Assessment/Plan    PT Assessment  Patient needs continued PT services  PT Problem List Decreased strength;Decreased activity tolerance;Decreased balance;Decreased mobility;Decreased coordination;Decreased safety awareness;Cardiopulmonary status limiting activity       PT Treatment Interventions DME instruction;Gait training;Stair training;Functional mobility training;Therapeutic activities;Therapeutic exercise;Balance training;Neuromuscular re-education;Patient/family education    PT Goals (Current goals can be found in the Care Plan section)  Acute Rehab PT Goals Patient Stated Goal: to return to independence, PLOF PT Goal Formulation: With patient/family Time For Goal Achievement: 07/25/16 Potential to Achieve Goals: Good    Frequency Min 3X/week   Barriers to discharge        Co-evaluation               AM-PAC PT "6 Clicks" Daily Activity  Outcome Measure Difficulty turning over in bed (including adjusting bedclothes, sheets and blankets)?: None Difficulty moving from lying on back to sitting on the side of the bed? : None Difficulty sitting down on and standing up from a chair with arms (e.g., wheelchair, bedside commode, etc,.)?: A Little Help needed moving to and from a bed to chair (including a wheelchair)?: A Little Help needed walking in hospital room?: A Little Help needed climbing 3-5 steps with a railing? : A Lot 6 Click Score: 19    End of Session Equipment Utilized During Treatment: Gait belt Activity Tolerance: Patient limited by fatigue;Other (comment) (pt limited by increased WOB and SOB) Patient left: in bed;with call bell/phone within reach;with family/visitor present Nurse Communication: Mobility status PT Visit Diagnosis: Other abnormalities of gait and mobility (R26.89)    Time: [id number] PT Time Calculation (min) (ACUTE ONLY): 17 min   Charges:   PT Evaluation $PT Eval Moderate Complexity: 1 Procedure     PT G Codes:   PT G-Codes **NOT FOR INPATIENT CLASS** Functional  Assessment Tool Used: AM-PAC 6 Clicks Basic Mobility;Clinical judgement Functional Limitation: Mobility: Walking and moving around Mobility: Walking and Moving Around Current Status ([id number]([id number]): At least 1 percent but less than 20 percent impaired, limited or restricted Mobility: Walking and Moving Around Goal Status [phone]([id number]): 0 percent impaired, limited or restricted    [facility] Developmental CenterJennifer Arna Luis, PT, DPT [phone]   Alessandra BevelsJennifer M Shondra Capps 07/11/2016, 1:26 PM

## 2016-07-11 NOTE — Care Management Note (Addendum)
Case Management Note  Patient Details  Name: Katherine Lane MRN: [id number] Date of Birth: 1932/04/23  Subjective/Objective:  Pt presents for SOB and increased weight gain. Pt continues on IV lasix. Pt is from home alone. Pt still drives and is not homebound. CM did provide pt with the Heart Failure booklet and provided education about weighing daily and Na intake. Pt has a new scale at home.    Action/Plan: CM will continue to monitor for DME 02 need. Pt ambulated with PT and sat's dropped.   Expected Discharge Date:                  Expected Discharge Plan:  Home/Self Care  In-House Referral:  NA  Discharge planning Services  CM Consult  Post Acute Care Choice:  Durable Medical Equipment (Question Need for 02. ) Choice offered to:     DME Arranged:    DME Agency:     HH Arranged:  NA HH Agency:  NA  Status of Service:  Completed, signed off  If discussed at Long Length of Stay Meetings, dates discussed:    Additional Comments:  Gala LewandowskyGraves-Bigelow, Iasha Mccalister Kaye, RN 07/11/2016, 4:11 PM

## 2016-07-11 NOTE — Care Management Obs Status (Signed)
MEDICARE OBSERVATION STATUS NOTIFICATION   Patient Details  Name: Katherine Lane MRN: [id number] Date of Birth: 1932-09-16   Medicare Observation Status Notification Given:  Yes    Gala LewandowskyGraves-Bigelow, Nzinga Ferran Kaye, RN 07/11/2016, 4:09 PM

## 2016-07-12 ENCOUNTER — Observation Stay (HOSPITAL_BASED_OUTPATIENT_CLINIC_OR_DEPARTMENT_OTHER): Payer: Medicare Other

## 2016-07-12 DIAGNOSIS — J811 Chronic pulmonary edema: Secondary | ICD-10-CM | POA: Diagnosis not present

## 2016-07-12 DIAGNOSIS — I4891 Unspecified atrial fibrillation: Secondary | ICD-10-CM | POA: Diagnosis not present

## 2016-07-12 DIAGNOSIS — I5033 Acute on chronic diastolic (congestive) heart failure: Secondary | ICD-10-CM | POA: Diagnosis not present

## 2016-07-12 DIAGNOSIS — I110 Hypertensive heart disease with heart failure: Secondary | ICD-10-CM | POA: Diagnosis not present

## 2016-07-12 DIAGNOSIS — R609 Edema, unspecified: Secondary | ICD-10-CM

## 2016-07-12 LAB — BASIC METABOLIC PANEL
Anion gap: 10 (ref 5–15)
BUN: 10 mg/dL (ref 6–20)
CO2: 25 mmol/L (ref 22–32)
Calcium: 9.4 mg/dL (ref 8.9–10.3)
Chloride: 101 mmol/L (ref 101–111)
Creatinine, Ser: 1.02 mg/dL — ABNORMAL HIGH (ref 0.44–1.00)
GFR calc Af Amer: 58 mL/min — ABNORMAL LOW (ref >60)
GFR calc non Af Amer: 50 mL/min — ABNORMAL LOW (ref >60)
Glucose, Bld: 120 mg/dL — ABNORMAL HIGH (ref 65–99)
Potassium: 4.1 mmol/L (ref 3.5–5.1)
Sodium: 136 mmol/L (ref 135–145)

## 2016-07-12 MED ORDER — FUROSEMIDE 20 MG PO TABS: 20.0000 mg | ORAL_TABLET | Freq: Every day | ORAL | 0 refills | Status: DC | PRN

## 2016-07-12 MED ORDER — PANTOPRAZOLE SODIUM 40 MG PO TBEC: 40.0000 mg | DELAYED_RELEASE_TABLET | Freq: Two times a day (BID) | ORAL | 0 refills | Status: DC

## 2016-07-12 NOTE — Progress Notes (Signed)
VASCULAR LAB PRELIMINARY  PRELIMINARY  PRELIMINARY  PRELIMINARY  Right lower extremity venous duplex completed.    Preliminary report:  Right:  No evidence of DVT, superficial thrombosis, or Baker's cyst.  Paitynn Mikus, RVS 07/12/2016, 3:19 PM

## 2016-07-12 NOTE — Progress Notes (Signed)
Pt who has been walking in the hall way earlier was having a sponge bath by the sink and experienced an episode of presyncope. Vitals signs, BP 119/ 76, P: 84, O2 sat 96 % on RA. R; 18, Md Paged.  Colleen Canesar Leverne Amrhein, RN

## 2016-07-12 NOTE — Progress Notes (Signed)
SATURATION QUALIFICATIONS: (This note is used to comply with regulatory documentation for home oxygen)  Patient Saturations on Room Air at Rest =98 %  Patient Saturations on Room Air while Ambulating = 93 %  Colleen Canesar Westen Dinino, RN

## 2016-07-15 NOTE — Discharge Summary (Signed)
[facility] Discharge Summary   Patient: Katherine Lane:[id number]   PCP: Merlyn Albert, MD DOB: [birth year]   Date of admission: 07/10/2016   Date of discharge: 07/12/2016    Discharge Diagnoses:  Principal Problem:   Acute on chronic diastolic CHF (congestive heart failure) (HCC) Active Problems:   Essential hypertension   CONSTIPATION, CHRONIC   Atrial fibrillation (HCC)   Hyponatremia   Pulmonary edema   Chronic coughing   Admitted From: home Disposition:  Home with home health  Recommendations for Outpatient Follow-up:  1. Please follow up with PCP in 1 week   Follow-up Information    Luking, Vilinda Blanks, MD. Schedule an appointment as soon as possible for a visit in 1 week(s).   Specialty:  Family Medicine Contact information: [address] [place] [address] [phone]          Diet recommendation: cardiac diet  Activity: The patient is advised to gradually reintroduce usual activities.  Discharge Condition: good  Code Status: full code  History of present illness: As per the H and P dictated on admission, "Katherine Lane is a 83 y.o. female  with past history of atrial fibrillation, anxiety, chronic constipation, depression, hypertension, insomnia, migraine headache, sodium deficiency and trigeminal neuralgia presents with chief complaint of weakness. Patient states that since her recent hospitalization she has been weak. No other exhalation. No chest pain or diaphoresis. Patient went to her physician for evaluation due to this weakness was told to come to the emergency room.  In the ED: Patient's x-ray show pulmonary edema. Given Lasix. Very effective. Hospitalist consulted for admission."  Hospital Course:  Summary of her active problems in the hospital is as following. 1. Acute on chronic diastolic CHF. Patient presented with complains of shortness of breath as well as weight gain. Patient gained roughly 4 pounds from her  recent discharge and 6 pounds from her baseline. Currently 92% on room air, respiratory distress better Patient received IV 40 mg Lasix Likely cause of acute worsening is lack of Diuretics at home after her recent admission with hyponatremia due to hypovolemia. Recommended to take lasix PRN at home based on her weight  2. Essential hypertension. Continuing home regimen. Patient is on 3 antihypertensive regimen. We'll continue to monitor at present.  3. Chronic cough. We'll start the patient on Protonix twice a day for 2 weeks. Recently she was on Vasotec till January and has been switched to the ARB because of the cough. If her cough does not improve with 2 weeks of PPI, recommend ENT follow-up.  4. Recent admission for hyponatremia. Patient recently presented with hyponatremia, it was thought due to hydrochlorothiazide and she was given IV fluids. Currently I recommended to resume diuresis with Lasix at home but only when necessary.  5. Generalized weakness and deconditioning. PTOT consult recommended home health.  6. Hypokalemia. Continue take potassium replacement.  7. Right leg edema  Vascula doppler rule out DVT   8. Dizziness  EKG unremarkable, orthostatics negative, oxygen normal, no focal deficit on exam. No further work up at present.  All other chronic medical condition were stable during the hospitalization.  Patient was seen by physical therapy, who recommended home health, which was arranged by Child psychotherapist and case Production designer, theatre/television/film. On the day of the discharge the patient's vitals were stable, and no other acute medical condition were reported by patient. the patient was felt safe to be discharge at home with home health.  Procedures and Results:  none  Consultations:  none  DISCHARGE MEDICATION: Discharge Medication List as of 07/12/2016  4:50 PM    START taking these medications   Details  furosemide (LASIX) 20 MG tablet Take 1 tablet (20 mg total) by  mouth daily as needed for fluid or edema. For weight gain of 3 Lbs in 1 day or 5 Lbs in 2 days., Starting Sat 07/12/2016, Until Sun 07/12/2017, Normal    pantoprazole (PROTONIX) 40 MG tablet Take 1 tablet (40 mg total) by mouth 2 (two) times daily before a meal., Starting Sat 07/12/2016, Until Fri 07/25/2016, Normal      CONTINUE these medications which have NOT CHANGED   Details  ANALPRAM-HC 1-1 % rectal cream APPLY TO AFFECTED AREAS TWICE DAILY., Normal    Carboxymethylcellul-Glycerin (CLEAR EYES FOR DRY EYES OP) Place 1-2 drops into both eyes 3 (three) times daily as needed (for dry eyes). , Historical Med    LORazepam (ATIVAN) 0.5 MG tablet TAKE ONE TABLET DAILY AS NEEDED., Print    metoprolol succinate (TOPROL-XL) 25 MG 24 hr tablet TAKE ONE TABLET BY MOUTH ONCE DAILY., Normal    ondansetron (ZOFRAN-ODT) 4 MG disintegrating tablet Take 4 mg by mouth as needed for nausea or vomiting. , Historical Med    polyethylene glycol powder (MIRALAX) powder Take 17 g by mouth 2 (two) times a week. , Historical Med    potassium chloride SA (K-DUR,KLOR-CON) 20 MEQ tablet Take 1 tablet (20 mEq total) by mouth daily., Starting Mon 04/14/2016, Normal    valsartan (DIOVAN) 160 MG tablet Take 1 tablet (160 mg total) by mouth daily., Starting Mon 05/05/2016, Normal    verapamil (CALAN-SR) 240 MG CR tablet TAKE (1) TABLET BY MOUTH EACH MORNING., Normal    XARELTO 20 MG TABS tablet TAKE 1 TABLET DAILY WITH SUPPER., Normal    zolpidem (AMBIEN) 10 MG tablet TAKE 1 TABLET BY MOUTH AT BEDTIME FOR SLEEP., Print    nystatin (MYCOSTATIN/NYSTOP) powder Apply topically 4 (four) times daily., Starting Fri 06/20/2016, Normal       Allergies  Allergen Reactions  . Codeine Nausea And Vomiting    Patient states intolerance to all pain medications (NO OPIOIDS) VERY VIOLENT VOMITING!!  . Tramadol Nausea And Vomiting    NO OPIOIDS!!! VERY VIOLENT VOMITING!!   Discharge Instructions    Diet - low sodium heart healthy     Complete by:  As directed    Discharge instructions    Complete by:  As directed    It is important that you read following instructions as well as go over your medication list with RN to help you understand your care after this hospitalization.  Discharge Instructions: Please follow-up with PCP in one week   Please request your primary care physician to go over all Hospital Tests and Procedure/Radiological results at the follow up,  Please get all Hospital records sent to your PCP by signing hospital release before you go home.   Do not take more than prescribed Pain, Sleep and Anxiety Medications. You were cared for by a hospitalist during your hospital stay. If you have any questions about your discharge medications or the care you received while you were in the hospital after you are discharged, you can call the unit and ask to speak with the hospitalist on call if the hospitalist that took care of you is not available.  Once you are discharged, your primary care physician will handle any further medical issues. Please note that NO REFILLS for any discharge  medications will be authorized once you are discharged, as it is imperative that you return to your primary care physician (or establish a relationship with a primary care physician if you do not have one) for your aftercare needs so that they can reassess your need for medications and monitor your lab values. You Must read complete instructions/literature along with all the possible adverse reactions/side effects for all the Medicines you take and that have been prescribed to you. Take any new Medicines after you have completely understood and accept all the possible adverse reactions/side effects. Wear Seat belts while driving. If you have smoked or chewed Tobacco in the last 2 yrs please stop smoking and/or stop any Recreational drug use.   Increase activity slowly    Complete by:  As directed      Discharge Exam: Filed Weights    07/11/16 0254 07/12/16 0446  Weight: 62.8 kg (138 lb 6.4 oz) 63.2 kg (139 lb 4.8 oz)   Vitals:   07/12/16 1135 07/12/16 1136  BP: (!) 136/91   Pulse:  94  Resp:    Temp:     General: Appear in no distress, no Rash; Oral Mucosa moist. Cardiovascular: S1 and S2 Present, no Murmur, no JVD Respiratory: Bilateral Air entry present and Clear to Auscultation, no Crackles, no wheezes Abdomen: Bowel Sound present, Soft and no tenderness Extremities: no Pedal edema, no calf tenderness Neurology: Grossly no focal neuro deficit.  The results of significant diagnostics from this hospitalization (including imaging, microbiology, ancillary and laboratory) are listed below for reference.    Significant Diagnostic Studies: X-ray Chest Pa And Lateral  Result Date: 07/11/2016 CLINICAL DATA:  Hoarseness.  Shortness of breath . EXAM: CHEST  2 VIEW COMPARISON:  07/10/2016. FINDINGS: Mediastinum hilar structures normal. Cardiomegaly with diffuse bilateral from interstitial prominence. Findings consistent with persistent interstitial edema. Right mid lung field calcified nodular densities most likely granulomas. Small bilateral pleural effusions. IMPRESSION: Findings consistent with persistent congestive heart failure with bilateral mild interstitial edema and small bilateral pleural effusions. Electronically Signed   By: Maisie Fus  Register   On: 07/11/2016 08:45   Dg Chest 2 View  Result Date: 07/10/2016 CLINICAL DATA:  Feeling very weak for few days and sob with exertion EXAM: CHEST  2 VIEW COMPARISON:  CT of the chest 05/11/2013, chest x-ray 04/11/2016 FINDINGS: Heart is mildly enlarged. There are interstitial changes consistent with interstitial pulmonary edema for chief within the perihilar and basilar regions. Small bilateral pleural effusions are present. No focal consolidations are identified. Right upper lobe granuloma is again identified. IMPRESSION: Mild interstitial pulmonary edema. Electronically Signed    By: Norva Pavlov M.D.   On: 07/10/2016 12:41   Dg Abdomen 1 View  Result Date: 06/30/2016 CLINICAL DATA:  Constipation today.  Nausea. EXAM: ABDOMEN - 1 VIEW COMPARISON:  None. FINDINGS: Moderate stool burden in the colon. Small volume of rectal stool. No dilated bowel to suggest obstruction. No evidence of free air. Multiple pelvic phleboliths. Calcified rib cartilage in the upper abdomen. No discrete radiopaque calculi. Mild scoliosis and degenerative change in the spine. IMPRESSION: Moderate stool burden throughout the colon. No evidence of obstruction. Electronically Signed   By: Rubye Oaks M.D.   On: 06/30/2016 23:33   Ct Head Wo Contrast  Result Date: 06/30/2016 CLINICAL DATA:  Initial evaluation for acute blurry vision, nausea. EXAM: CT HEAD WITHOUT CONTRAST TECHNIQUE: Contiguous axial images were obtained from the base of the skull through the vertex without intravenous contrast. COMPARISON:  Prior  CT from 12/06/2015. FINDINGS: Brain: Generalized cerebral atrophy with moderate chronic microvascular ischemic disease. No acute intracranial hemorrhage. No evidence for acute large vessel territory infarct. No mass lesion, midline shift or mass effect. No hydrocephalus. No extra-axial fluid collection. Vascular: No hyperdense vessel. Scattered vascular calcifications noted within the carotid siphons. Skull: Scalp soft tissues within normal limits.  Calvarium intact. Sinuses/Orbits: Globes and orbital soft tissues within normal limits. Other: Visualized paranasal sinuses are largely clear. No mastoid effusion. IMPRESSION: 1. No acute intracranial process identified. 2. Generalized age-related cerebral atrophy with moderate chronic microvascular disease. Electronically Signed   By: Rise MuBenjamin  McClintock M.D.   On: 06/30/2016 22:18    Microbiology: No results found for this or any previous visit (from the past 240 hour(s)).   Labs: CBC:  Recent Labs Lab 07/10/16 1119 07/11/16 0352    WBC 7.2 6.1  HGB 11.6* 10.6*  HCT 35.2* 32.9*  MCV 91.0 90.9  PLT 356 343   Basic Metabolic Panel:  Recent Labs Lab 07/10/16 1119 07/11/16 0352 07/12/16 0738  NA 136 137 136  K 4.1 3.5 4.1  CL 104 102 101  CO2 24 27 25   GLUCOSE 106* 99 120*  BUN 10 9 10   CREATININE 0.90 0.92 1.02*  CALCIUM 9.0 8.5* 9.4  MG  --  2.0  --    Liver Function Tests: No results for input(s): AST, ALT, ALKPHOS, BILITOT, PROT, ALBUMIN in the last 168 hours. No results for input(s): LIPASE, AMYLASE in the last 168 hours. No results for input(s): AMMONIA in the last 168 hours. Cardiac Enzymes:  Recent Labs Lab 07/10/16 1119 07/10/16 1617  TROPONINI <0.03 <0.03   BNP (last 3 results)  Recent Labs  07/10/16 1119  BNP 253.3*   CBG: No results for input(s): GLUCAP in the last 168 hours. Time spent: 35 minutes  Signed:  Danene Montijo  [facility] 07/12/2016 , 7:41 AM

## 2016-07-18 ENCOUNTER — Ambulatory Visit (INDEPENDENT_AMBULATORY_CARE_PROVIDER_SITE_OTHER): Payer: Medicare Other | Admitting: Nurse Practitioner

## 2016-07-18 ENCOUNTER — Encounter: Payer: Self-pay | Admitting: Nurse Practitioner

## 2016-07-18 DIAGNOSIS — E871 Hypo-osmolality and hyponatremia: Secondary | ICD-10-CM

## 2016-07-18 DIAGNOSIS — K219 Gastro-esophageal reflux disease without esophagitis: Secondary | ICD-10-CM

## 2016-07-18 DIAGNOSIS — E876 Hypokalemia: Secondary | ICD-10-CM | POA: Diagnosis not present

## 2016-07-18 MED ORDER — PANTOPRAZOLE SODIUM 40 MG PO TBEC: 40.0000 mg | DELAYED_RELEASE_TABLET | Freq: Two times a day (BID) | ORAL | 2 refills | Status: DC

## 2016-07-18 NOTE — Progress Notes (Signed)
Subjective:  Presents for recheck after recent hospitalization for congestive heart failure. Currently on Lasix and potassium. Has dropped a significant amount of fluid, much improved overall including her breathing. Still having some problems with energy at times but again improved. Was started on Protonix twice a day to see if this would help with his chronic cough mainly at night, cough has completely resolved at this point.  Objective:   BP 134/80   Ht 5\' 1"  (1.549 m)   Wt 136 lb 4 oz (61.8 kg)   BMI 25.74 kg/m  NAD. Alert, oriented. Cheerful affect. Much improved from previous visit. Lungs clear. Heart fairly regular rate and rhythm. Lower extremities no edema. Abdomen soft nondistended nontender.  Assessment:   Problem List Items Addressed This Visit      Digestive   Gastroesophageal reflux disease without esophagitis   Relevant Medications   pantoprazole (PROTONIX) 40 MG tablet     Other   Hypokalemia   Relevant Orders   Basic metabolic panel   Hyponatremia   Relevant Orders   Basic metabolic panel       Plan:  Repeat labs. Patient and her daughter understand these were be available on Monday. Continue twice a day Protonix for now but 1 symptoms have improved cut back to daily dose. Follow-up with cardiologist as planned next week, call back sooner if needed.

## 2016-07-19 LAB — BASIC METABOLIC PANEL
BUN/Creatinine Ratio: 13 (ref 12–28)
BUN: 14 mg/dL (ref 8–27)
CO2: 25 mmol/L (ref 18–29)
Calcium: 9.9 mg/dL (ref 8.7–10.3)
Chloride: 98 mmol/L (ref 96–106)
Creatinine, Ser: 1.06 mg/dL — ABNORMAL HIGH (ref 0.57–1.00)
GFR calc Af Amer: 57 mL/min/{1.73_m2} — ABNORMAL LOW (ref >59)
GFR calc non Af Amer: 49 mL/min/{1.73_m2} — ABNORMAL LOW (ref >59)
Glucose: 72 mg/dL (ref 65–99)
Potassium: 4.6 mmol/L (ref 3.5–5.2)
Sodium: 139 mmol/L (ref 134–144)

## 2016-07-25 ENCOUNTER — Ambulatory Visit (INDEPENDENT_AMBULATORY_CARE_PROVIDER_SITE_OTHER): Payer: Medicare Other | Admitting: Cardiovascular Disease

## 2016-07-25 ENCOUNTER — Encounter: Payer: Self-pay | Admitting: Cardiovascular Disease

## 2016-07-25 ENCOUNTER — Other Ambulatory Visit (HOSPITAL_COMMUNITY)
Admission: RE | Admit: 2016-07-25 | Discharge: 2016-07-25 | Disposition: A | Discharge status: Home / Self Care | Payer: Medicare Other | Source: Ambulatory Visit | Attending: Cardiovascular Disease | Admitting: Cardiovascular Disease

## 2016-07-25 VITALS — BP 140/80 | HR 83 | Ht 63.0 in | Wt 137.0 lb

## 2016-07-25 DIAGNOSIS — I4892 Unspecified atrial flutter: Secondary | ICD-10-CM | POA: Insufficient documentation

## 2016-07-25 DIAGNOSIS — I481 Persistent atrial fibrillation: Secondary | ICD-10-CM

## 2016-07-25 DIAGNOSIS — I10 Essential (primary) hypertension: Secondary | ICD-10-CM | POA: Insufficient documentation

## 2016-07-25 DIAGNOSIS — I4819 Other persistent atrial fibrillation: Secondary | ICD-10-CM

## 2016-07-25 DIAGNOSIS — F419 Anxiety disorder, unspecified: Secondary | ICD-10-CM

## 2016-07-25 DIAGNOSIS — I5032 Chronic diastolic (congestive) heart failure: Secondary | ICD-10-CM | POA: Diagnosis not present

## 2016-07-25 LAB — BASIC METABOLIC PANEL
Anion gap: 9 (ref 5–15)
BUN: 11 mg/dL (ref 6–20)
CO2: 27 mmol/L (ref 22–32)
Calcium: 9.4 mg/dL (ref 8.9–10.3)
Chloride: 102 mmol/L (ref 101–111)
Creatinine, Ser: 1.03 mg/dL — ABNORMAL HIGH (ref 0.44–1.00)
GFR calc Af Amer: 57 mL/min — ABNORMAL LOW (ref >60)
GFR calc non Af Amer: 50 mL/min — ABNORMAL LOW (ref >60)
Glucose, Bld: 100 mg/dL — ABNORMAL HIGH (ref 65–99)
Potassium: 4.4 mmol/L (ref 3.5–5.1)
Sodium: 138 mmol/L (ref 135–145)

## 2016-07-25 LAB — CBC
HCT: 40.3 % (ref 36.0–46.0)
Hemoglobin: 13.3 g/dL (ref 12.0–15.0)
MCH: 30.3 pg (ref 26.0–34.0)
MCHC: 33 g/dL (ref 30.0–36.0)
MCV: 91.8 fL (ref 78.0–100.0)
Platelets: 464 10*3/uL — ABNORMAL HIGH (ref 150–400)
RBC: 4.39 MIL/uL (ref 3.87–5.11)
RDW: 14.3 % (ref 11.5–15.5)
WBC: 5.7 10*3/uL (ref 4.0–10.5)

## 2016-07-25 LAB — MAGNESIUM: Magnesium: 2.2 mg/dL (ref 1.7–2.4)

## 2016-07-25 NOTE — Patient Instructions (Signed)
Your physician recommends that you schedule a follow-up appointment in: 1 month Dr Purvis SheffieldKoneswaran   Take Potassium only when you take Lasix    Get lab work: cbc,bmet, magnesium   Record heart rate, blood pressure and weight    Thank you for choosing [facility] !

## 2016-07-25 NOTE — Progress Notes (Signed)
SUBJECTIVE: The patient presents for follow-up after being hospitalized for acute on chronic diastolic heart failure. She had been taken off hydrochlorothiazide due to hyponatremia. This is been a recurrent problem. She was instructed to take Lasix as needed based on daily weights.  She also has a history of malignant hypertension and paroxysmal atrial fibrillation. She has a history of anxiety as well.  She and her daughter have several questions about her medications. She had a spell this past Monday of weakness where her daughter found her on the floor. She did not pass out. Her blood pressure was reportedly normal but it is unclear what her blood pressure was when she first began experiencing these symptoms. She had to take Lasix on Tuesday and Wednesday. She checks her blood pressure at home with systolic readings ranging from 130-140. She weighs herself daily. She avoids salt. She denies chest pain, shortness of breath, and palpitations.  Echocardiogram 07/01/16: Vigorous left ventricular systolic function, LVEF 65-70%, mild LVH, mild mitral, aortic, and tricuspid regurgitation.  Labs 07/18/16: Sodium 139, potassium 4.6, chloride 98, bicarbonate 25, BUN 14, creatinine 1.06.  5/25: Hemoglobin 10.6  5/14: Albumin 4.3.   Review of Systems: As per "subjective", otherwise negative.  Allergies  Allergen Reactions  . Codeine Nausea And Vomiting    Patient states intolerance to all pain medications (NO OPIOIDS) VERY VIOLENT VOMITING!!  . Tramadol Nausea And Vomiting    NO OPIOIDS!!! VERY VIOLENT VOMITING!!    Current Outpatient Prescriptions  Medication Sig Dispense Refill  . ANALPRAM-HC 1-1 % rectal cream APPLY TO AFFECTED AREAS TWICE DAILY. 28.4 g prn  . furosemide (LASIX) 20 MG tablet Take 1 tablet (20 mg total) by mouth daily as needed for fluid or edema. For weight gain of 3 Lbs in 1 day or 5 Lbs in 2 days. 30 tablet 0  . LORazepam (ATIVAN) 0.5 MG tablet TAKE ONE TABLET DAILY  AS NEEDED. (Patient taking differently: Take 0.5 mg in the evening) 30 tablet 2  . metoprolol succinate (TOPROL-XL) 25 MG 24 hr tablet TAKE ONE TABLET BY MOUTH ONCE DAILY. (Patient taking differently: Take 25 mg by mouth in the morning) 90 tablet 0  . nystatin (MYCOSTATIN/NYSTOP) powder Apply topically 4 (four) times daily. 30 g 1  . polyethylene glycol powder (MIRALAX) powder Take 17 g by mouth 2 (two) times a week.     . potassium chloride SA (K-DUR,KLOR-CON) 20 MEQ tablet Take 1 tablet (20 mEq total) by mouth daily. (Patient taking differently: Take 10 mEq by mouth 2 (two) times daily. ) 30 tablet 11  . valsartan (DIOVAN) 160 MG tablet Take 1 tablet (160 mg total) by mouth daily. (Patient taking differently: Take 160 mg by mouth every evening. ) 30 tablet 5  . verapamil (CALAN-SR) 240 MG CR tablet TAKE (1) TABLET BY MOUTH EACH MORNING. (Patient taking differently: Take 240 mg by mouth in the morning) 30 tablet 5  . XARELTO 20 MG TABS tablet TAKE 1 TABLET DAILY WITH SUPPER. (Patient taking differently: Take 20 mg by mouth daily with supper) 30 tablet 6  . zolpidem (AMBIEN) 10 MG tablet TAKE 1 TABLET BY MOUTH AT BEDTIME FOR SLEEP. 30 tablet 5  . ondansetron (ZOFRAN-ODT) 4 MG disintegrating tablet Take 4 mg by mouth as needed for nausea or vomiting.     . pantoprazole (PROTONIX) 40 MG tablet Take 1 tablet (40 mg total) by mouth 2 (two) times daily before a meal. (Patient not taking: Reported on 07/25/2016) 60  tablet 2   No current facility-administered medications for this visit.     Past Medical History:  Diagnosis Date  . A-fib (HCC)   . Anxiety   . Arthritis   . Chronic back pain   . Constipation, chronic   . Depression   . HTN (hypertension)   . Insomnia   . Migraine headache   . Osteoporosis   . PONV (postoperative nausea and vomiting)   . Pulmonary edema 06/2016  . Sodium (Na) deficiency   . Trigeminal neuralgia     Past Surgical History:  Procedure Laterality Date  . ANKLE  FRACTURE SURGERY     2010-right  . CARDIAC CATHETERIZATION     denies  . CATARACT EXTRACTION W/PHACO Right 07/22/2012   Procedure: CATARACT EXTRACTION PHACO AND INTRAOCULAR LENS PLACEMENT (IOC);  Surgeon: Gemma Payor, MD;  Location: [facility];  Service: Ophthalmology;  Laterality: Right;  CDE:  18.93  . CATARACT EXTRACTION W/PHACO Left 08/09/2012   Procedure: CATARACT EXTRACTION PHACO AND INTRAOCULAR LENS PLACEMENT (IOC);  Surgeon: Gemma Payor, MD;  Location: [facility];  Service: Ophthalmology;  Laterality: Left;  CDE: 17.21  . COLONOSCOPY    . HARDWARE REMOVAL  07/18/2011   Procedure: HARDWARE REMOVAL;  Surgeon: Vickki Hearing, MD;  Location: [facility];  Service: Orthopedics;  Laterality: Right;  . NERVE REPAIR  07/18/2011   Procedure: NERVE REPAIR;  Surgeon: Vickki Hearing, MD;  Location: [facility];  Service: Orthopedics;  Laterality: Right;  Right leg superficial peroneal nerve release    . PARTIAL HYSTERECTOMY      Social History   Social History  . Marital status: Widowed    Spouse name: N/A  . Number of children: N/A  . Years of education: N/A   Occupational History  . retired Retired   Social History Main Topics  . Smoking status: Never Smoker  . Smokeless tobacco: Never Used  . Alcohol use No  . Drug use: No  . Sexual activity: Not Currently    Birth control/ protection: Surgical     Comment: hyst   Other Topics Concern  . Not on file   Social History Narrative  . No narrative on file     Vitals:   07/25/16 1055  BP: 140/80  Pulse: 83  SpO2: 98%  Weight: 137 lb (62.1 kg)  Height: 5\' 3"  (1.6 m)    Wt Readings from Last 3 Encounters:  07/25/16 137 lb (62.1 kg)  07/18/16 136 lb 4 oz (61.8 kg)  07/12/16 139 lb 4.8 oz (63.2 kg)     PHYSICAL EXAM General: NAD HEENT: Normal. Neck: No JVD, no thyromegaly. Lungs: Clear to auscultation bilaterally with normal respiratory effort. CV: Nondisplaced PMI.  Regular rate and irregular rhythm, normal S1/S2, no S3, no  murmur. No pretibial or periankle edema.     Abdomen: Soft, nontender, no distention.  Neurologic: Alert and oriented.  Psych: Normal affect. Skin: Normal. Musculoskeletal: No gross deformities.    ECG: Most recent ECG reviewed.   Labs: Lab Results  Component Value Date/Time   K 4.6 07/18/2016 09:55 AM   BUN 14 07/18/2016 09:55 AM   CREATININE 1.06 (H) 07/18/2016 09:55 AM   CREATININE 0.90 10/18/2014 10:38 AM   ALT 16 06/30/2016 09:23 PM   TSH 1.845 12/06/2015 11:41 AM   TSH 2.200 09/24/2015 08:37 AM   HGB 10.6 (L) 07/11/2016 03:52 AM   HGB 13.2 09/24/2015 08:37 AM     Lipids: Lab Results  Component Value Date/Time  LDLCALC 93 09/24/2015 08:37 AM   CHOL 177 09/24/2015 08:37 AM   TRIG 133 09/24/2015 08:37 AM   HDL 57 09/24/2015 08:37 AM       ASSESSMENT AND PLAN: 1. Persistnent atrial fibrillation: Symptomatically stable on verapamil and metoprolol succinate. Continue Xarelto. She remains in atrial fibrillation. I have asked her to monitor her heart rate. If she is tachycardic, this could precipitate diastolic heart failure.  2. Malignant HTN: Reasonably controlled on Toprol-XL 25 mg daily, valsartan 160 mg daily, and long-acting verapamil 240 mg daily. No changes. I will obtain a basic metabolic panel, CBC, and magnesium.  3. Anxiety: She has prn Ativan. She may require something on a daily basis. I will defer to PCP regarding this.  4. Chronic diastolic heart failure: Currently euvolemic. Takes Lasix as needed. She did take it this past Tuesday and Wednesday. I will obtain a basic metabolic panel, CBC, and magnesium. I have asked her to weigh herself daily.    Disposition: Follow up 1 month  Time spent: 40 minutes, of which greater than 50% was spent reviewing symptoms, relevant blood tests and studies, and discussing management plan with the patient.   Prentice Docker, M.D., F.A.C.C.

## 2016-08-19 ENCOUNTER — Other Ambulatory Visit: Payer: Self-pay | Admitting: Family Medicine

## 2016-08-19 NOTE — Telephone Encounter (Signed)
This +2 refills 

## 2016-08-26 ENCOUNTER — Other Ambulatory Visit: Payer: Self-pay | Admitting: Family Medicine

## 2016-08-26 NOTE — Telephone Encounter (Signed)
Last seen: 07/18/2016

## 2016-08-26 NOTE — Telephone Encounter (Signed)
Ok six mo worth 

## 2016-09-02 ENCOUNTER — Ambulatory Visit (INDEPENDENT_AMBULATORY_CARE_PROVIDER_SITE_OTHER): Payer: Medicare Other | Admitting: Cardiovascular Disease

## 2016-09-02 ENCOUNTER — Encounter: Payer: Self-pay | Admitting: Cardiovascular Disease

## 2016-09-02 VITALS — BP 142/78 | HR 83 | Ht 63.0 in | Wt 138.0 lb

## 2016-09-02 DIAGNOSIS — I5032 Chronic diastolic (congestive) heart failure: Secondary | ICD-10-CM | POA: Diagnosis not present

## 2016-09-02 DIAGNOSIS — F419 Anxiety disorder, unspecified: Secondary | ICD-10-CM | POA: Diagnosis not present

## 2016-09-02 DIAGNOSIS — I481 Persistent atrial fibrillation: Secondary | ICD-10-CM | POA: Diagnosis not present

## 2016-09-02 DIAGNOSIS — I10 Essential (primary) hypertension: Secondary | ICD-10-CM | POA: Diagnosis not present

## 2016-09-02 DIAGNOSIS — I4819 Other persistent atrial fibrillation: Secondary | ICD-10-CM

## 2016-09-02 MED ORDER — VERAPAMIL HCL ER 240 MG PO TBCR: EXTENDED_RELEASE_TABLET | ORAL | 3 refills | Status: DC

## 2016-09-02 MED ORDER — RIVAROXABAN 20 MG PO TABS: ORAL_TABLET | ORAL | 2 refills | Status: DC

## 2016-09-02 MED ORDER — POTASSIUM CHLORIDE CRYS ER 20 MEQ PO TBCR: 10.0000 meq | EXTENDED_RELEASE_TABLET | Freq: Two times a day (BID) | ORAL | 3 refills | Status: DC

## 2016-09-02 MED ORDER — FUROSEMIDE 20 MG PO TABS: 20.0000 mg | ORAL_TABLET | Freq: Every day | ORAL | 3 refills | Status: DC | PRN

## 2016-09-02 MED ORDER — METOPROLOL SUCCINATE ER 25 MG PO TB24: ORAL_TABLET | ORAL | 3 refills | Status: DC

## 2016-09-02 MED ORDER — LOSARTAN POTASSIUM 50 MG PO TABS: 50.0000 mg | ORAL_TABLET | Freq: Every day | ORAL | 3 refills | Status: DC

## 2016-09-02 NOTE — Progress Notes (Signed)
SUBJECTIVE: The patient presents for follow-up of malignant hypertension, chronic diastolic heart failure, and paroxysmal atrial fibrillation.  Echocardiogram 07/01/16: Vigorous left ventricular systolic function, LVEF 65-70%, mild LVH, mild mitral, aortic, and tricuspid regurgitation.  She is feeling well. She is here with her granddaughter, [name], who visits with her at least once per week.  She brought in a detailed blood pressure and weight loss which I personally reviewed. Heart rate, blood pressures, and weights have been relatively stable overall. She ends up having to take Lasix at least twice per week for fluid accumulation in her legs and feet. She does not want to use compression stockings. She denies chest pain. She has occasional palpitations.   Review of Systems: As per "subjective", otherwise negative.  Allergies  Allergen Reactions  . Codeine Nausea And Vomiting    Patient states intolerance to all pain medications (NO OPIOIDS) VERY VIOLENT VOMITING!!  . Tramadol Nausea And Vomiting    NO OPIOIDS!!! VERY VIOLENT VOMITING!!    Current Outpatient Prescriptions  Medication Sig Dispense Refill  . ANALPRAM-HC 1-1 % rectal cream APPLY TO AFFECTED AREAS TWICE DAILY. 28.4 g prn  . furosemide (LASIX) 20 MG tablet Take 1 tablet (20 mg total) by mouth daily as needed for fluid or edema. For weight gain of 3 Lbs in 1 day or 5 Lbs in 2 days. 30 tablet 0  . LORazepam (ATIVAN) 0.5 MG tablet TAKE ONE TABLET DAILY AS NEEDED. 30 tablet 5  . metoprolol succinate (TOPROL-XL) 25 MG 24 hr tablet TAKE ONE TABLET BY MOUTH ONCE DAILY. (Patient taking differently: Take 25 mg by mouth in the morning) 90 tablet 0  . nystatin (MYCOSTATIN/NYSTOP) powder Apply topically 4 (four) times daily. 30 g 1  . ondansetron (ZOFRAN-ODT) 4 MG disintegrating tablet Take 4 mg by mouth as needed for nausea or vomiting.     . polyethylene glycol powder (MIRALAX) powder Take 17 g by mouth 2 (two) times a week.      . potassium chloride SA (K-DUR,KLOR-CON) 20 MEQ tablet Take 1 tablet (20 mEq total) by mouth daily. (Patient taking differently: Take 10 mEq by mouth 2 (two) times daily. ) 30 tablet 11  . valsartan (DIOVAN) 160 MG tablet Take 1 tablet (160 mg total) by mouth daily. (Patient taking differently: Take 160 mg by mouth every evening. ) 30 tablet 5  . verapamil (CALAN-SR) 240 MG CR tablet TAKE (1) TABLET BY MOUTH EACH MORNING. (Patient taking differently: Take 240 mg by mouth in the morning) 30 tablet 5  . XARELTO 20 MG TABS tablet TAKE 1 TABLET DAILY WITH SUPPER. (Patient taking differently: Take 20 mg by mouth daily with supper) 30 tablet 6  . zolpidem (AMBIEN) 10 MG tablet TAKE 1 TABLET BY MOUTH AT BEDTIME FOR SLEEP. 30 tablet 2  . pantoprazole (PROTONIX) 40 MG tablet Take 1 tablet (40 mg total) by mouth 2 (two) times daily before a meal. (Patient not taking: Reported on 07/25/2016) 60 tablet 2   No current facility-administered medications for this visit.     Past Medical History:  Diagnosis Date  . A-fib (HCC)   . Anxiety   . Arthritis   . Chronic back pain   . Constipation, chronic   . Depression   . HTN (hypertension)   . Insomnia   . Migraine headache   . Osteoporosis   . PONV (postoperative nausea and vomiting)   . Pulmonary edema 06/2016  . Sodium (Na) deficiency   . Trigeminal  neuralgia     Past Surgical History:  Procedure Laterality Date  . ANKLE FRACTURE SURGERY     2010-right  . CARDIAC CATHETERIZATION     denies  . CATARACT EXTRACTION W/PHACO Right 07/22/2012   Procedure: CATARACT EXTRACTION PHACO AND INTRAOCULAR LENS PLACEMENT (IOC);  Surgeon: Gemma Payor, MD;  Location: [facility];  Service: Ophthalmology;  Laterality: Right;  CDE:  18.93  . CATARACT EXTRACTION W/PHACO Left 08/09/2012   Procedure: CATARACT EXTRACTION PHACO AND INTRAOCULAR LENS PLACEMENT (IOC);  Surgeon: Gemma Payor, MD;  Location: [facility];  Service: Ophthalmology;  Laterality: Left;  CDE: 17.21  .  COLONOSCOPY    . HARDWARE REMOVAL  07/18/2011   Procedure: HARDWARE REMOVAL;  Surgeon: Vickki Hearing, MD;  Location: [facility];  Service: Orthopedics;  Laterality: Right;  . NERVE REPAIR  07/18/2011   Procedure: NERVE REPAIR;  Surgeon: Vickki Hearing, MD;  Location: [facility];  Service: Orthopedics;  Laterality: Right;  Right leg superficial peroneal nerve release    . PARTIAL HYSTERECTOMY      Social History   Social History  . Marital status: Widowed    Spouse name: N/A  . Number of children: N/A  . Years of education: N/A   Occupational History  . retired Retired   Social History Main Topics  . Smoking status: Never Smoker  . Smokeless tobacco: Never Used  . Alcohol use No  . Drug use: No  . Sexual activity: Not Currently    Birth control/ protection: Surgical     Comment: hyst   Other Topics Concern  . Not on file   Social History Narrative  . No narrative on file     Vitals:   09/02/16 0835  BP: (!) 142/78  Pulse: 83  SpO2: 98%  Weight: 138 lb (62.6 kg)  Height: 5\' 3"  (1.6 m)    Wt Readings from Last 3 Encounters:  09/02/16 138 lb (62.6 kg)  07/25/16 137 lb (62.1 kg)  07/18/16 136 lb 4 oz (61.8 kg)     PHYSICAL EXAM General: NAD HEENT: Normal. Neck: No JVD, no thyromegaly. Lungs: Clear to auscultation bilaterally with normal respiratory effort. CV: Nondisplaced PMI.  Regular rate and rhythm, normal S1/S2, no S3/S4, 2/4 holodiastolic murmur along left sternal border. No pretibial or periankle edema.  No carotid bruit.   Abdomen: Soft, nontender, no distention.  Neurologic: Alert and oriented.  Psych: Normal affect. Skin: Normal. Musculoskeletal: No gross deformities.    ECG: Most recent ECG reviewed.   Labs: Lab Results  Component Value Date/Time   K 4.4 07/25/2016 11:52 AM   BUN 11 07/25/2016 11:52 AM   BUN 14 07/18/2016 09:55 AM   CREATININE 1.03 (H) 07/25/2016 11:52 AM   CREATININE 0.90 10/18/2014 10:38 AM   ALT 16 06/30/2016 09:23  PM   TSH 1.845 12/06/2015 11:41 AM   TSH 2.200 09/24/2015 08:37 AM   HGB 13.3 07/25/2016 11:52 AM   HGB 13.2 09/24/2015 08:37 AM     Lipids: Lab Results  Component Value Date/Time   LDLCALC 93 09/24/2015 08:37 AM   CHOL 177 09/24/2015 08:37 AM   TRIG 133 09/24/2015 08:37 AM   HDL 57 09/24/2015 08:37 AM       ASSESSMENT AND PLAN:  1. Persistnent atrial fibrillation: Symptomatically stable on verapamil and metoprolol succinate. Continue Xarelto. She is in a regular rhythm today.   2. MalignantHTN: Mildly elevated on Toprol-XL 25 mg daily, valsartan 160 mg daily, and long-acting verapamil 240 mg daily.  No changes. BP log shows overall good control.  3. Anxiety:She has prnAtivan. She may require something on a daily basis. I will defer to PCP regarding this.  4. Chronic diastolic heart failure: Currently euvolemic. Takes Lasix as needed. I have asked her to continue to weigh herself daily.     Disposition: Follow up 4 months   Prentice Docker, M.D., F.A.C.C.

## 2016-09-02 NOTE — Addendum Note (Signed)
Addended by: Marlyn CorporalARLTON, Geniyah Eischeid A on: 09/02/2016 09:17 AM   Modules accepted: Orders

## 2016-09-02 NOTE — Patient Instructions (Addendum)
Your physician recommends that you schedule a follow-up appointment in:  4 months with Dr Purvis SheffieldKoneswaran  STOP Diovan  START Losartan 50 mg daily  If you need a refill on your cardiac medications before your next appointment, please call your pharmacy.    No testing or labs ordered today.     Thank you for choosing [facility] !

## 2016-11-29 ENCOUNTER — Other Ambulatory Visit: Payer: Self-pay | Admitting: Family Medicine

## 2016-12-01 NOTE — Telephone Encounter (Signed)
Dr. Steeves patient 

## 2016-12-01 NOTE — Telephone Encounter (Signed)
Last seen 09/03/16 

## 2016-12-02 NOTE — Telephone Encounter (Signed)
Ok six mo 

## 2016-12-10 ENCOUNTER — Other Ambulatory Visit: Payer: Self-pay | Admitting: Family Medicine

## 2016-12-10 NOTE — Telephone Encounter (Signed)
Six months worth please

## 2016-12-10 NOTE — Telephone Encounter (Signed)
Last seen 09/02/16

## 2016-12-24 ENCOUNTER — Other Ambulatory Visit: Payer: Self-pay | Admitting: Neurosurgery

## 2016-12-25 ENCOUNTER — Other Ambulatory Visit: Payer: Self-pay

## 2016-12-25 ENCOUNTER — Telehealth: Payer: Self-pay | Admitting: Cardiovascular Disease

## 2016-12-25 ENCOUNTER — Encounter (HOSPITAL_COMMUNITY): Payer: Self-pay | Admitting: *Deleted

## 2016-12-25 NOTE — Telephone Encounter (Signed)
Pt is having L2, L3, L5 Kyphoplasty tomorrow due to Fx's in her back. Dr. Maeola HarmanJoseph Stern at [facility] would like to make sure it's ok for the pt to stop her Xarelto as of yesterday, please give Shanda BumpsJessica a call @ [phone]

## 2016-12-25 NOTE — Progress Notes (Signed)
I left a message on [name]'s voice mail asking if cardiology ok'ed patient to hold Katherine Lane.

## 2016-12-25 NOTE — Telephone Encounter (Signed)
Dr.Koneswaran said it was fine for Ms.Katherine Lane to hold her Xarelto starting yesterday , for pending kyphoplasty.   I spoke with Shanda BumpsJessica at [facility] at [phone]

## 2016-12-25 NOTE — Telephone Encounter (Signed)
Will forward to Dr. Koneswaran  

## 2016-12-25 NOTE — H&P (Signed)
Patient ID:   [id number] Patient: Katherine Lane  Date of Birth: 02-May-1932 Visit Type: Office Visit   Date: 12/24/2016 12:30 PM Provider: Danae Orleans. Venetia Maxon MD   This 83 year old female presents for back pain.   History of Present Illness: 1.  back pain  Patient returns for review of MRI that occurred today. A report has not been generated yet. She reports no improvement, possible decline, since the radiofrequency ablation in October. She reports an episode of back pain after doing yard work before the ablation. Gabapentin 100 milligrams t.i.d. has offered little relief.  Patient states "I may be able to get in and out of bed easier".  She requires a walker or wheelchair for mobility.  MRI on [facility] shows acute lumbar fractures at the L 2, L 3, L 5 levels without bony retropulsion.           MEDICATIONS(added, continued or stopped this visit): Started Medication Directions Instruction Stopped   Ambien 10 mg tablet take 1 tablet by oral route  every day at bedtime     enalapril maleate 20 mg tablet take 1 tablet by oral route 2 times every day    12/17/2016 gabapentin 100 mg capsule take 1 capsule by oral route 3 times every day     lorazepam 0.5 mg tablet take 1 tablet by oral route 3 times every day as needed     losartan 50 mg tablet take 1 tablet by oral route  every day     metoprolol tartrate 25 mg tablet take 1 tablet by oral route 2 times every day    10/21/2016 tizanidine 2 mg tablet take 1 tablet by oral route  every 8 hours as needed for muscle spasms/pain     verapamil ER (SR) 240 mg tablet,extended release take 1 tablet by oral route  every day with food     Xarelto 20 mg tablet take 1 tablet by oral route  every day with the evening meal       ALLERGIES: Ingredient Reaction Medication Name Comment  CODEINE Nausea/Vomiting        Vitals Date Temp F BP Pulse Ht In Wt Lb BMI BSA Pain Score  12/24/2016  169/78 69 63 140 24.8  5/10      IMPRESSION Patient  returns to review MRI. She reports worsening of back pain since radiofrequency ablation in October. MRI shows fracture at L5, L3, and L2. No evidence of stenosis. On confrontational testing, full strength in lower extremities and negative seated SLR bilaterally. I explained to the patient this back pain is likely not the result of a complication of the radiofrequency ablation and more likely from back pain she developed after gardening, shortly before RFA procedure. Schedule L2, L3, and L5 kyphoplasty this Friday.  Patient to stop Xeralto today.     Pain Management Plan Pain Scale: 5/10. Method: Numeric Pain Intensity Scale. Location: back. Onset: 05/21/2015. Duration: varies. Quality: discomforting. Pain management follow-up plan of care: Patient is taking OTC pain relievers for relief..  Schedule L2, L3, and L5 kyphoplasty on 12/26/16.              Provider:  Venetia Maxon MD, Danae Orleans 12/24/2016 1:35 PM  Dictation edited by: Lajoyce Lauber    CC Providers: Ardyth Gal [address] [place],  [place]  [address]   Lilyan Punt  [facility] [address] [place], [place] [address]  Electronically signed by Danae OrleansJoseph D. Venetia MaxonStern MD on 12/24/2016 01:38 PM  Patient ID:   [id number] Patient: Katherine Lane  Date of Birth: 18-Mar-1932 Visit Type: Office Visit   Date: 12/17/2016 10:30 AM Provider: Joneen RoachSarah F. Desai PAC  Historian: self  This 83 year old female presents for pain.   History of Present Illness: 1.  pain  Katherine Lane presents to the clinic today accompanied by her daughter.  She and her daughter both very distressed.  The patient appears to be extremely anxious and upset.  She is tearful and is very upset with how she has been treated by our office.  The patient was seen on November 10, 2016 for a ganglion impar the and coccygeal RFA.  She had previously been seen by Dr. Venetia MaxonStern for chronic low back pain. She had an MRI  performed last fall which showed a mild disc bulge at L3-4 with some minimal stenosis.  She was sent to us for injection therapy.  In November of last year she received an L4-5 epidural steroid injection with some improvement in her back pain.  Ultimately she continued having some rectal pain and a trial of impar blocks was done.  She reported at least 30 percent improvement if not more after a trial of to blocks.  The relief was somewhat short-lived so ultimately the patient returned for the radiofrequency ablation procedure.  On October 1st the patient called stating that she was in severe pain. She had a significant increase in her back pain. The patient was requesting to be seen.  Unfortunately there were no appointments available with Dr. Ollen BowlHarkins.  The patient and her daughter states that they were both told that Dr. Ollen BowlHarkins was "refusing" to see the patient and ultimately she had to be put on my schedule.  I did explain today that Dr. Ollen BowlHarkins did not have any available appointments to be seen immediately and that was not that he was refusing care.  He recommended that the patient go to the emergency room because she was requesting imaging.  Unfortunately this was not communicated well to the patient. She felt like we for sending her to the emergency room to seek pain medication which she cannot tolerate.  She also states that she is unable to lie flat on the table for an MRI and wanted to come to our office because our web site states that we have upright MRI.  Unfortunately that is our office in [place] and not the [place] location.  In the last four weeks the patient has not been evaluated by anyone for this severe debilitating pain. She did complete the prednisone pack but ultimately did not find it beneficial.  Today she is indicating her low back when I ask her to locate the area of most severe pain.  The pain is radiating around to both sides and into both hips.  She has no radicular symptoms at  this point.  She denies much in the way of pain radiating down into the tailbone the rectal region itself.  Most of the pain is in the low back itself.  She reports no numbness or tingling in the lower extremities. She does have significant weakness in the legs but ultimately feels that is related more to the severity of pain she is dealing with.  She also states that any movement at all so set saw significant spasming all across her lumbar and thoracic region.  It takes approximately 15 to 20 minutes of lying still in order  to get symptoms to resolve.  Patient is quite upset while discussing her pain symptoms.  She is very tearful during the visit.  She has severe pain even with very small movements.  She cannot tolerate much movement in her lower extremities at all.  She is walking with a walker but is utilizing either a walker or wheelchair for mobility.  She states she is no longer independent.  She has to have assistance 24 hours a day with even simple activities of daily living.  She states prior to the injection she was completely independent.  She rates her pain at 10/10.  She states this is the most severe the pain has ever been.  She reports no improvement with the radiofrequency procedure however that was targeting the tailbone and rectal pain which today she is not complaining of pain in that region.  So it may be that the RFA actually did help to improve that pain symptoms but the low back pain is now overwhelming.  It is difficult to accurately evaluate it at this point.         Medical/Surgical/Interim History Reviewed, no change.  Last detailed document date:11/14/2015.     PAST MEDICAL HISTORY, SURGICAL HISTORY, FAMILY HISTORY, SOCIAL HISTORY AND REVIEW OF SYSTEMS I have reviewed the patient's past medical, surgical, family and social history as well as the comprehensive review of systems as included on the [facility] history form dated 01/03/2016, which I  have signed.  Family History: Reviewed, no changes.  Last detailed document date:11/14/2015.   Social History: Reviewed, no changes. Last detailed document date: 11/14/2015.    MEDICATIONS(added, continued or stopped this visit): Started Medication Directions Instruction Stopped   Ambien 10 mg tablet take 1 tablet by oral route  every day at bedtime     enalapril maleate 20 mg tablet take 1 tablet by oral route 2 times every day    12/17/2016 gabapentin 100 mg capsule take 1 capsule by oral route 3 times every day     lorazepam 0.5 mg tablet take 1 tablet by oral route 3 times every day as needed     losartan 50 mg tablet take 1 tablet by oral route  every day     metoprolol tartrate 25 mg tablet take 1 tablet by oral route 2 times every day    11/20/2016 Sterapred 6 day pack 10mg  ORAL take as directed  12/17/2016  10/21/2016 tizanidine 2 mg tablet take 1 tablet by oral route  every 8 hours as needed for muscle spasms/pain     verapamil ER (SR) 240 mg tablet,extended release take 1 tablet by oral route  every day with food     Xarelto 20 mg tablet take 1 tablet by oral route  every day with the evening meal       ALLERGIES: Ingredient Reaction Medication Name Comment  CODEINE Nausea/Vomiting       Review of Systems System Neg/Pos Details  Constitutional Negative Chills, Fatigue, Fever, Malaise, Night sweats, Weight gain and Weight loss.  ENMT Negative Ear drainage, Hearing loss, Nasal drainage, Otalgia, Sinus pressure and Sore throat.  Eyes Negative Eye discharge, Eye pain and Vision changes.  Respiratory Negative Chronic cough, Cough, Dyspnea, Known TB exposure and Wheezing.  Cardio Negative Chest pain, Claudication, Edema and Irregular heartbeat/palpitations.  GI Negative Abdominal pain, Blood in stool, Change in stool pattern, Constipation, Decreased appetite, Diarrhea, Heartburn, Nausea and Vomiting.  GU Negative Dysuria, Hematuria, Polyuria (Genitourinary), Urinary  frequency, Urinary  incontinence and Urinary retention.  Endocrine Negative Cold intolerance, Heat intolerance, Polydipsia and Polyphagia.  Neuro Positive Extremity weakness, Gait disturbance, Numbness in extremity.  Neuro Negative Dizziness, Headache, Memory impairment, Seizures and Tremors.  Psych Positive Anxiety, Depression, Insomnia.  Integumentary Negative Brittle hair, Brittle nails, Change in shape/size of mole(s), Hair loss, Hirsutism, Hives, Pruritus, Rash and Skin lesion.  MS Positive Back pain, Joint pain, Muscle weakness, Muscle cramps/spasms.  MS Negative Joint swelling and Neck pain.  Hema/Lymph Negative Easy bleeding, Easy bruising and Lymphadenopathy.  Allergic/Immuno Negative Contact allergy, Environmental allergies, Food allergies and Seasonal allergies.  Reproductive Negative Breast discharge, Breast lumps, Dysmenorrhea, Dyspareunia, History of abnormal PAP smear, Hot flashes, Irregular menses and Vaginal discharge.   Vitals Date Temp F BP Pulse Ht In Wt Lb BMI BSA Pain Score  12/17/2016  184/90 79 63 138.8 24.59  10/10     PHYSICAL EXAM General General Appearance: ill Mood/Affect: agitated, angry, anxious Orientation: normal Pulses/Edema: normal Gait/Station: antalgic, uses a walker, slow Coordination: normal Respiratory: non-labored during exam Pupils: equal, anicteric Level of Distress: moderate distress Overall Appearance: anxious, tearful, angry  Skin Lumbar Spine: normal  Respiratory Lungs: non-labored  Neurological Orientation: normal Recent and Remote Memory: normal Attention Span and Concentration:   normal Language: normal Fund of Knowledge: normal  Right Left Upper Extremity Coordination: normal normal  Lower Extremity Coordination: normal normal  Musculoskeletal Gait and Station:   Right Left Lower Extremity Muscle Strength: decreased decreased  Stability Lumbar Spine: motion is with pain, tenderness  Range of Motion Lumbar  Spine: all severe, decreased range of motion   Cranial Nerves II. Optic Nerve/Visual Fields: normal III. Oculomotor: normal IV. Trochlear: normal V. Trigeminal: normal VI. Abducens: normal VII. Facial: normal VIII. Acoustic/Vestibular: normal IX. Glossopharyngeal: normal X. Vagus: normal XI. Spinal Accessory: normal XII. Hypoglossal: normal   Patient unable to tolerate much movement due to severe pain. Physical exam limited due to pain.    IMPRESSION Severe low back pain  Completed Orders (this encounter) Order Details Reason Side Interpretation Result Initial Treatment Date Region  Hypertension education Follow up with Primary Care Provider for evelated Blood Pressure.         Assessment/Plan # Detail Type Description   1. Assessment Coccydynia (M53.3).       2. Assessment Rectal pain (K62.89).       3. Assessment Low back pain, unspecified back pain laterality, with sciatica presence unspecified (M54.5).       4. Assessment Essential (primary) hypertension (I10).           Pain Management Plan Pain Scale: 10/10. Method: Numeric Pain Intensity Scale. Location: back. Onset: 05/21/2015. Duration: varies. Quality: discomforting. Pain management follow-up plan of care: Patient alternating heat and ice..  Fall Risk Plan The patient has not fallen in the last year.  The patient would like an immediate MRI however I explained that her office is unable to do that.  Our office is booked out until next week.  [facility] is booked out for three weeks.  I did explain that if she wanted an emergent MRI she would need to go to the emergency room in order to do that.  She does not wish to proceed with that route.  Dr. Venetia Maxon was kind enough to provide me with a visit so that she can be evaluated by him which is what she was requesting.  She will receive her MRI at our office prior to that appointment.  She and her daughter felt like  this was probably the best option  and most convenient for her.   I, again, apologized for the confusion and stress that this has created.  I have started her on a low dose of gabapentin to see if this can help at all with her pain symptoms.  She is unable to tolerate any type of narcotic pain medication due to severe nausea and vomiting.  She is also not a candidate for anti-inflammatories.  She did not find Prednisone effective.  Orders: Diagnostic Procedures: Assessment Procedure  M54.5 MRI Spine/lumb W/o Contrast  Instruction(s)/Education: Assessment Instruction  I10 Hypertension education    MEDICATIONS PRESCRIBED TODAY    Rx Quantity Refills  GABAPENTIN 100 mg  90 0            Provider:  Celine Mans [facility], [place] 12/17/2016 12:50 PM   Under the supervision of Gwynne Edinger PhD MD Dictation edited by: Joneen Roach [facility]    CC Providers: Ardyth Gal [address] [place],  [place]  [address]   [facility] [address] [place], [place] [address]              Electronically signed by Joneen Roach PAC on 12/17/2016 12:50 PM

## 2016-12-26 ENCOUNTER — Ambulatory Visit (HOSPITAL_COMMUNITY): Payer: Medicare Other | Admitting: Anesthesiology

## 2016-12-26 ENCOUNTER — Encounter (HOSPITAL_COMMUNITY): Payer: Self-pay | Admitting: *Deleted

## 2016-12-26 ENCOUNTER — Encounter (HOSPITAL_COMMUNITY)
Admission: RE | Disposition: A | Discharge status: Home / Self Care | Payer: Self-pay | Source: Ambulatory Visit | Attending: Neurosurgery

## 2016-12-26 ENCOUNTER — Other Ambulatory Visit: Payer: Self-pay

## 2016-12-26 ENCOUNTER — Ambulatory Visit (HOSPITAL_COMMUNITY)
Admission: RE | Admit: 2016-12-26 | Discharge: 2016-12-27 | Disposition: A | Discharge status: Home / Self Care | Payer: Medicare Other | Source: Ambulatory Visit | Attending: Neurosurgery | Admitting: Neurosurgery

## 2016-12-26 ENCOUNTER — Ambulatory Visit (HOSPITAL_COMMUNITY): Payer: Medicare Other

## 2016-12-26 DIAGNOSIS — K6289 Other specified diseases of anus and rectum: Secondary | ICD-10-CM | POA: Diagnosis not present

## 2016-12-26 DIAGNOSIS — Z79899 Other long term (current) drug therapy: Secondary | ICD-10-CM | POA: Insufficient documentation

## 2016-12-26 DIAGNOSIS — Z9889 Other specified postprocedural states: Secondary | ICD-10-CM | POA: Insufficient documentation

## 2016-12-26 DIAGNOSIS — Z993 Dependence on wheelchair: Secondary | ICD-10-CM | POA: Insufficient documentation

## 2016-12-26 DIAGNOSIS — I110 Hypertensive heart disease with heart failure: Secondary | ICD-10-CM | POA: Diagnosis not present

## 2016-12-26 DIAGNOSIS — Z885 Allergy status to narcotic agent status: Secondary | ICD-10-CM | POA: Insufficient documentation

## 2016-12-26 DIAGNOSIS — M48061 Spinal stenosis, lumbar region without neurogenic claudication: Secondary | ICD-10-CM | POA: Insufficient documentation

## 2016-12-26 DIAGNOSIS — M4856XA Collapsed vertebra, not elsewhere classified, lumbar region, initial encounter for fracture: Secondary | ICD-10-CM | POA: Insufficient documentation

## 2016-12-26 DIAGNOSIS — Z419 Encounter for procedure for purposes other than remedying health state, unspecified: Secondary | ICD-10-CM

## 2016-12-26 DIAGNOSIS — I509 Heart failure, unspecified: Secondary | ICD-10-CM | POA: Diagnosis not present

## 2016-12-26 DIAGNOSIS — G8929 Other chronic pain: Secondary | ICD-10-CM | POA: Insufficient documentation

## 2016-12-26 DIAGNOSIS — S32000A Wedge compression fracture of unspecified lumbar vertebra, initial encounter for closed fracture: Secondary | ICD-10-CM | POA: Diagnosis present

## 2016-12-26 DIAGNOSIS — Z7901 Long term (current) use of anticoagulants: Secondary | ICD-10-CM | POA: Diagnosis not present

## 2016-12-26 DIAGNOSIS — M533 Sacrococcygeal disorders, not elsewhere classified: Secondary | ICD-10-CM | POA: Diagnosis not present

## 2016-12-26 HISTORY — DX: Personal history of other medical treatment: Z92.89

## 2016-12-26 HISTORY — PX: KYPHOPLASTY: SHX5884

## 2016-12-26 HISTORY — DX: Heart failure, unspecified: I50.9

## 2016-12-26 HISTORY — DX: Family history of other specified conditions: Z84.89

## 2016-12-26 LAB — CBC
HCT: 39.3 % (ref 36.0–46.0)
Hemoglobin: 13.2 g/dL (ref 12.0–15.0)
MCH: 30.5 pg (ref 26.0–34.0)
MCHC: 33.6 g/dL (ref 30.0–36.0)
MCV: 90.8 fL (ref 78.0–100.0)
Platelets: 313 10*3/uL (ref 150–400)
RBC: 4.33 MIL/uL (ref 3.87–5.11)
RDW: 14.7 % (ref 11.5–15.5)
WBC: 4.2 10*3/uL (ref 4.0–10.5)

## 2016-12-26 LAB — BASIC METABOLIC PANEL
Anion gap: 8 (ref 5–15)
BUN: 15 mg/dL (ref 6–20)
CO2: 26 mmol/L (ref 22–32)
Calcium: 9.2 mg/dL (ref 8.9–10.3)
Chloride: 105 mmol/L (ref 101–111)
Creatinine, Ser: 0.87 mg/dL (ref 0.44–1.00)
GFR calc Af Amer: >60 mL/min (ref >60)
GFR calc non Af Amer: >60 mL/min (ref >60)
Glucose, Bld: 84 mg/dL (ref 65–99)
Potassium: 3.9 mmol/L (ref 3.5–5.1)
Sodium: 139 mmol/L (ref 135–145)

## 2016-12-26 LAB — SURGICAL PCR SCREEN
MRSA, PCR: NEGATIVE
Staphylococcus aureus: NEGATIVE

## 2016-12-26 LAB — PROTIME-INR
INR: 1.02
Prothrombin Time: 13.3 seconds (ref 11.4–15.2)

## 2016-12-26 SURGERY — KYPHOPLASTY
Anesthesia: General | Site: Spine Lumbar

## 2016-12-26 MED ORDER — PROPOFOL 10 MG/ML IV BOLUS
INTRAVENOUS | Status: AC
  Filled 2016-12-26: qty 20

## 2016-12-26 MED ORDER — POLYVINYL ALCOHOL 1.4 % OP SOLN
1.0000 [drp] | OPHTHALMIC | Status: DC | PRN
  Filled 2016-12-26: qty 15

## 2016-12-26 MED ORDER — HYDROCORTISONE ACE-PRAMOXINE 1-1 % RE CREA
TOPICAL_CREAM | Freq: Four times a day (QID) | RECTAL | Status: DC | PRN
  Filled 2016-12-26: qty 30

## 2016-12-26 MED ORDER — CARBOXYMETHYLCELLUL-GLYCERIN 0.5-0.9 % OP SOLN: 2.0000 [drp] | Freq: Four times a day (QID) | OPHTHALMIC | Status: DC | PRN

## 2016-12-26 MED ORDER — BUPIVACAINE HCL (PF) 0.5 % IJ SOLN
INTRAMUSCULAR | Status: AC
  Filled 2016-12-26: qty 30

## 2016-12-26 MED ORDER — FLEET ENEMA 7-19 GM/118ML RE ENEM: 1.0000 | ENEMA | Freq: Once | RECTAL | Status: DC | PRN

## 2016-12-26 MED ORDER — IOPAMIDOL (ISOVUE-300) INJECTION 61%
INTRAVENOUS | Status: AC
  Filled 2016-12-26: qty 50

## 2016-12-26 MED ORDER — SUGAMMADEX SODIUM 200 MG/2ML IV SOLN
INTRAVENOUS | Status: DC | PRN
  Administered 2016-12-26: 120 mg via INTRAVENOUS

## 2016-12-26 MED ORDER — KCL IN DEXTROSE-NACL 20-5-0.45 MEQ/L-%-% IV SOLN: INTRAVENOUS | Status: DC

## 2016-12-26 MED ORDER — VERAPAMIL HCL ER 240 MG PO TBCR
240.0000 mg | EXTENDED_RELEASE_TABLET | Freq: Every day | ORAL | Status: DC
  Filled 2016-12-26: qty 1

## 2016-12-26 MED ORDER — SCOPOLAMINE 1 MG/3DAYS TD PT72
MEDICATED_PATCH | TRANSDERMAL | Status: AC
  Filled 2016-12-26: qty 1

## 2016-12-26 MED ORDER — DEXAMETHASONE SODIUM PHOSPHATE 10 MG/ML IJ SOLN
INTRAMUSCULAR | Status: DC | PRN
  Administered 2016-12-26: 10 mg via INTRAVENOUS

## 2016-12-26 MED ORDER — POLYETHYLENE GLYCOL 3350 17 G PO PACK: 17.0000 g | PACK | Freq: Every day | ORAL | Status: DC | PRN

## 2016-12-26 MED ORDER — DEXTROSE 5 % IV SOLN
500.0000 mg | Freq: Four times a day (QID) | INTRAVENOUS | Status: DC | PRN
  Filled 2016-12-26: qty 5

## 2016-12-26 MED ORDER — ROCURONIUM BROMIDE 100 MG/10ML IV SOLN
INTRAVENOUS | Status: DC | PRN
  Administered 2016-12-26: 20 mg via INTRAVENOUS
  Administered 2016-12-26: 40 mg via INTRAVENOUS

## 2016-12-26 MED ORDER — MORPHINE SULFATE (PF) 2 MG/ML IV SOLN: 1.0000 mg | INTRAVENOUS | Status: DC | PRN

## 2016-12-26 MED ORDER — BISACODYL 10 MG RE SUPP: 10.0000 mg | Freq: Every day | RECTAL | Status: DC | PRN

## 2016-12-26 MED ORDER — FENTANYL CITRATE (PF) 250 MCG/5ML IJ SOLN
INTRAMUSCULAR | Status: AC
  Filled 2016-12-26: qty 5

## 2016-12-26 MED ORDER — ONDANSETRON HCL 4 MG/2ML IJ SOLN
INTRAMUSCULAR | Status: DC | PRN
Start: 2016-12-26 — End: 2016-12-26
  Administered 2016-12-26: 4 mg via INTRAVENOUS

## 2016-12-26 MED ORDER — LIDOCAINE 2% (20 MG/ML) 5 ML SYRINGE
INTRAMUSCULAR | Status: AC
  Filled 2016-12-26: qty 5

## 2016-12-26 MED ORDER — CEFAZOLIN SODIUM-DEXTROSE 2-4 GM/100ML-% IV SOLN
2.0000 g | Freq: Three times a day (TID) | INTRAVENOUS | Status: DC
  Administered 2016-12-26: 2 g via INTRAVENOUS
  Filled 2016-12-26: qty 100

## 2016-12-26 MED ORDER — CHLORHEXIDINE GLUCONATE CLOTH 2 % EX PADS: 6.0000 | MEDICATED_PAD | Freq: Once | CUTANEOUS | Status: DC

## 2016-12-26 MED ORDER — DEXAMETHASONE SODIUM PHOSPHATE 10 MG/ML IJ SOLN
INTRAMUSCULAR | Status: AC
  Filled 2016-12-26: qty 1

## 2016-12-26 MED ORDER — ONDANSETRON HCL 4 MG/2ML IJ SOLN
4.0000 mg | Freq: Four times a day (QID) | INTRAMUSCULAR | Status: DC | PRN
  Administered 2016-12-26: 4 mg via INTRAVENOUS
  Filled 2016-12-26: qty 2

## 2016-12-26 MED ORDER — CEFAZOLIN SODIUM-DEXTROSE 2-4 GM/100ML-% IV SOLN
2.0000 g | INTRAVENOUS | Status: AC
  Administered 2016-12-26: 2 g via INTRAVENOUS
  Filled 2016-12-26: qty 100

## 2016-12-26 MED ORDER — LIDOCAINE-EPINEPHRINE 1 %-1:100000 IJ SOLN
INTRAMUSCULAR | Status: DC | PRN
  Administered 2016-12-26: 8 mL

## 2016-12-26 MED ORDER — LIDOCAINE HCL (CARDIAC) 20 MG/ML IV SOLN
INTRAVENOUS | Status: DC | PRN
  Administered 2016-12-26: 60 mg via INTRAVENOUS

## 2016-12-26 MED ORDER — METHOCARBAMOL 500 MG PO TABS
500.0000 mg | ORAL_TABLET | Freq: Four times a day (QID) | ORAL | Status: DC | PRN
  Administered 2016-12-26: 500 mg via ORAL
  Filled 2016-12-26: qty 1

## 2016-12-26 MED ORDER — ACETAMINOPHEN 650 MG RE SUPP
650.0000 mg | RECTAL | Status: DC | PRN
Start: 2016-12-26 — End: 2016-12-27

## 2016-12-26 MED ORDER — ESMOLOL HCL 100 MG/10ML IV SOLN
INTRAVENOUS | Status: DC | PRN
  Administered 2016-12-26: 50 mg via INTRAVENOUS

## 2016-12-26 MED ORDER — DOCUSATE SODIUM 100 MG PO CAPS
100.0000 mg | ORAL_CAPSULE | Freq: Two times a day (BID) | ORAL | Status: DC
  Administered 2016-12-26: 100 mg via ORAL
  Filled 2016-12-26: qty 1

## 2016-12-26 MED ORDER — FENTANYL CITRATE (PF) 100 MCG/2ML IJ SOLN
INTRAMUSCULAR | Status: DC | PRN
  Administered 2016-12-26: 50 ug via INTRAVENOUS
  Administered 2016-12-26: 100 ug via INTRAVENOUS

## 2016-12-26 MED ORDER — SODIUM CHLORIDE 0.9% FLUSH: 3.0000 mL | INTRAVENOUS | Status: DC | PRN

## 2016-12-26 MED ORDER — LACTATED RINGERS IV SOLN
INTRAVENOUS | Status: DC
  Administered 2016-12-26: 14:00:00 via INTRAVENOUS

## 2016-12-26 MED ORDER — PHENYLEPHRINE 40 MCG/ML (10ML) SYRINGE FOR IV PUSH (FOR BLOOD PRESSURE SUPPORT)
PREFILLED_SYRINGE | INTRAVENOUS | Status: AC
  Filled 2016-12-26: qty 10

## 2016-12-26 MED ORDER — ACETAMINOPHEN 500 MG PO TABS
1000.0000 mg | ORAL_TABLET | Freq: Two times a day (BID) | ORAL | Status: DC | PRN
Start: 2016-12-26 — End: 2016-12-26

## 2016-12-26 MED ORDER — SUGAMMADEX SODIUM 200 MG/2ML IV SOLN
INTRAVENOUS | Status: AC
  Filled 2016-12-26: qty 2

## 2016-12-26 MED ORDER — SCOPOLAMINE 1 MG/3DAYS TD PT72
MEDICATED_PATCH | TRANSDERMAL | Status: DC | PRN
  Administered 2016-12-26: 1 via TRANSDERMAL

## 2016-12-26 MED ORDER — VERAPAMIL HCL ER 240 MG PO TBCR
240.0000 mg | EXTENDED_RELEASE_TABLET | Freq: Once | ORAL | Status: AC
  Administered 2016-12-26: 240 mg via ORAL
  Filled 2016-12-26: qty 1

## 2016-12-26 MED ORDER — PHENOL 1.4 % MT LIQD: 1.0000 | OROMUCOSAL | Status: DC | PRN

## 2016-12-26 MED ORDER — ALUM & MAG HYDROXIDE-SIMETH 200-200-20 MG/5ML PO SUSP: 30.0000 mL | Freq: Four times a day (QID) | ORAL | Status: DC | PRN

## 2016-12-26 MED ORDER — ROCURONIUM BROMIDE 10 MG/ML (PF) SYRINGE
PREFILLED_SYRINGE | INTRAVENOUS | Status: AC
  Filled 2016-12-26: qty 5

## 2016-12-26 MED ORDER — ONDANSETRON HCL 4 MG PO TABS: 4.0000 mg | ORAL_TABLET | Freq: Four times a day (QID) | ORAL | Status: DC | PRN

## 2016-12-26 MED ORDER — HYDROCODONE-ACETAMINOPHEN 5-325 MG PO TABS: 2.0000 | ORAL_TABLET | ORAL | Status: DC | PRN

## 2016-12-26 MED ORDER — LIDOCAINE-EPINEPHRINE 1 %-1:100000 IJ SOLN
INTRAMUSCULAR | Status: AC
  Filled 2016-12-26: qty 1

## 2016-12-26 MED ORDER — FUROSEMIDE 20 MG PO TABS
20.0000 mg | ORAL_TABLET | Freq: Every day | ORAL | Status: DC | PRN
Start: 2016-12-26 — End: 2016-12-27

## 2016-12-26 MED ORDER — METOPROLOL SUCCINATE ER 25 MG PO TB24: 25.0000 mg | ORAL_TABLET | Freq: Every day | ORAL | Status: DC

## 2016-12-26 MED ORDER — FENTANYL CITRATE (PF) 100 MCG/2ML IJ SOLN: 25.0000 ug | INTRAMUSCULAR | Status: DC | PRN

## 2016-12-26 MED ORDER — IOPAMIDOL (ISOVUE-300) INJECTION 61%
INTRAVENOUS | Status: DC | PRN
  Administered 2016-12-26 (×2): 50 mL

## 2016-12-26 MED ORDER — ONDANSETRON HCL 4 MG/2ML IJ SOLN
INTRAMUSCULAR | Status: AC
  Filled 2016-12-26: qty 2

## 2016-12-26 MED ORDER — 0.9 % SODIUM CHLORIDE (POUR BTL) OPTIME
TOPICAL | Status: DC | PRN
  Administered 2016-12-26: 1000 mL

## 2016-12-26 MED ORDER — LOSARTAN POTASSIUM 50 MG PO TABS
50.0000 mg | ORAL_TABLET | Freq: Every day | ORAL | Status: DC
  Administered 2016-12-26: 50 mg via ORAL
  Filled 2016-12-26: qty 1

## 2016-12-26 MED ORDER — DEXAMETHASONE SODIUM PHOSPHATE 10 MG/ML IJ SOLN
INTRAMUSCULAR | Status: AC
  Filled 2016-12-26: qty 3

## 2016-12-26 MED ORDER — LORAZEPAM 0.5 MG PO TABS
0.5000 mg | ORAL_TABLET | Freq: Every evening | ORAL | Status: DC | PRN
  Administered 2016-12-26: 0.5 mg via ORAL
  Filled 2016-12-26: qty 1

## 2016-12-26 MED ORDER — POLYETHYLENE GLYCOL 3350 17 GM/SCOOP PO POWD: 17.0000 g | Freq: Every day | ORAL | Status: DC | PRN

## 2016-12-26 MED ORDER — BUPIVACAINE HCL (PF) 0.5 % IJ SOLN
INTRAMUSCULAR | Status: DC | PRN
  Administered 2016-12-26: 8 mL

## 2016-12-26 MED ORDER — MENTHOL 3 MG MT LOZG: 1.0000 | LOZENGE | OROMUCOSAL | Status: DC | PRN

## 2016-12-26 MED ORDER — EPHEDRINE 5 MG/ML INJ
INTRAVENOUS | Status: AC
  Filled 2016-12-26: qty 20

## 2016-12-26 MED ORDER — PROPOFOL 10 MG/ML IV BOLUS
INTRAVENOUS | Status: DC | PRN
  Administered 2016-12-26: 110 mg via INTRAVENOUS

## 2016-12-26 MED ORDER — ACETAMINOPHEN 325 MG PO TABS: 650.0000 mg | ORAL_TABLET | ORAL | Status: DC | PRN

## 2016-12-26 MED ORDER — KCL IN DEXTROSE-NACL 20-5-0.45 MEQ/L-%-% IV SOLN
INTRAVENOUS | Status: AC
  Filled 2016-12-26: qty 1000

## 2016-12-26 MED ORDER — POTASSIUM CHLORIDE CRYS ER 20 MEQ PO TBCR: 20.0000 meq | EXTENDED_RELEASE_TABLET | Freq: Every day | ORAL | Status: DC | PRN

## 2016-12-26 MED ORDER — PHENYLEPHRINE HCL 10 MG/ML IJ SOLN
INTRAMUSCULAR | Status: DC | PRN
  Administered 2016-12-26 (×4): 80 ug via INTRAVENOUS

## 2016-12-26 MED ORDER — ZOLPIDEM TARTRATE 5 MG PO TABS
5.0000 mg | ORAL_TABLET | Freq: Every evening | ORAL | Status: DC | PRN
  Administered 2016-12-26: 5 mg via ORAL
  Filled 2016-12-26: qty 1

## 2016-12-26 MED ORDER — SODIUM CHLORIDE 0.9% FLUSH
3.0000 mL | Freq: Two times a day (BID) | INTRAVENOUS | Status: DC
  Administered 2016-12-26: 3 mL via INTRAVENOUS

## 2016-12-26 MED ORDER — ACETAMINOPHEN 500 MG PO TABS
1000.0000 mg | ORAL_TABLET | Freq: Two times a day (BID) | ORAL | Status: DC | PRN
  Administered 2016-12-26: 1000 mg via ORAL
  Filled 2016-12-26: qty 2

## 2016-12-26 SURGICAL SUPPLY — 50 items
BLADE CLIPPER SURG (BLADE) IMPLANT
BLADE SURG 15 STRL LF DISP TIS (BLADE) ×1 IMPLANT
BLADE SURG 15 STRL SS (BLADE) ×1
CARTRIDGE OIL MAESTRO DRILL (MISCELLANEOUS) IMPLANT
CEMENT BONE KYPHX HV R (Orthopedic Implant) ×2 IMPLANT
DECANTER SPIKE VIAL GLASS SM (MISCELLANEOUS) ×2 IMPLANT
DERMABOND ADVANCED (GAUZE/BANDAGES/DRESSINGS) ×1
DERMABOND ADVANCED .7 DNX12 (GAUZE/BANDAGES/DRESSINGS) ×1 IMPLANT
DIFFUSER DRILL AIR PNEUMATIC (MISCELLANEOUS) ×2 IMPLANT
DRAPE C-ARM 42X72 X-RAY (DRAPES) ×2 IMPLANT
DRAPE HALF SHEET 40X57 (DRAPES) ×2 IMPLANT
DRAPE LAPAROTOMY 100X72X124 (DRAPES) ×2 IMPLANT
DRAPE SURG 17X23 STRL (DRAPES) ×2 IMPLANT
DRAPE WARM FLUID 44X44 (DRAPE) ×2 IMPLANT
DRSG OPSITE POSTOP 3X4 (GAUZE/BANDAGES/DRESSINGS) ×2 IMPLANT
DRSG OPSITE POSTOP 4X6 (GAUZE/BANDAGES/DRESSINGS) ×2 IMPLANT
DURAPREP 26ML APPLICATOR (WOUND CARE) IMPLANT
GAUZE SPONGE 4X4 16PLY XRAY LF (GAUZE/BANDAGES/DRESSINGS) ×2 IMPLANT
GLOVE BIO SURGEON STRL SZ8 (GLOVE) ×2 IMPLANT
GLOVE BIOGEL PI IND STRL 6.5 (GLOVE) ×1 IMPLANT
GLOVE BIOGEL PI IND STRL 7.0 (GLOVE) ×1 IMPLANT
GLOVE BIOGEL PI IND STRL 8 (GLOVE) ×1 IMPLANT
GLOVE BIOGEL PI IND STRL 8.5 (GLOVE) ×2 IMPLANT
GLOVE BIOGEL PI INDICATOR 6.5 (GLOVE) ×1
GLOVE BIOGEL PI INDICATOR 7.0 (GLOVE) ×1
GLOVE BIOGEL PI INDICATOR 8 (GLOVE) ×1
GLOVE BIOGEL PI INDICATOR 8.5 (GLOVE) ×2
GLOVE EXAM NITRILE LRG STRL (GLOVE) IMPLANT
GLOVE EXAM NITRILE XL STR (GLOVE) IMPLANT
GLOVE EXAM NITRILE XS STR PU (GLOVE) IMPLANT
GLOVE SURG SS PI 6.5 STRL IVOR (GLOVE) ×2 IMPLANT
GOWN STRL REUS W/ TWL LRG LVL3 (GOWN DISPOSABLE) ×1 IMPLANT
GOWN STRL REUS W/ TWL XL LVL3 (GOWN DISPOSABLE) ×1 IMPLANT
GOWN STRL REUS W/TWL 2XL LVL3 (GOWN DISPOSABLE) ×2 IMPLANT
GOWN STRL REUS W/TWL LRG LVL3 (GOWN DISPOSABLE) ×1
GOWN STRL REUS W/TWL XL LVL3 (GOWN DISPOSABLE) ×1
KIT BASIN OR (CUSTOM PROCEDURE TRAY) ×2 IMPLANT
KIT ROOM TURNOVER OR (KITS) ×2 IMPLANT
NEEDLE HYPO 25X1 1.5 SAFETY (NEEDLE) ×2 IMPLANT
NS IRRIG 1000ML POUR BTL (IV SOLUTION) ×2 IMPLANT
OIL CARTRIDGE MAESTRO DRILL (MISCELLANEOUS)
PACK SURGICAL SETUP 50X90 (CUSTOM PROCEDURE TRAY) ×2 IMPLANT
PAD ARMBOARD 7.5X6 YLW CONV (MISCELLANEOUS) ×6 IMPLANT
STAPLER SKIN PROX WIDE 3.9 (STAPLE) ×2 IMPLANT
SUT VIC AB 3-0 SH 8-18 (SUTURE) ×2 IMPLANT
SYR CONTROL 10ML LL (SYRINGE) ×4 IMPLANT
TOWEL GREEN STERILE (TOWEL DISPOSABLE) ×2 IMPLANT
TOWEL GREEN STERILE FF (TOWEL DISPOSABLE) ×2 IMPLANT
TRAY KYPHOPAK 15/3 ONESTEP 1ST (MISCELLANEOUS) ×2 IMPLANT
TRAY KYPHOPAK 20/3 ONESTEP 1ST (MISCELLANEOUS) ×2 IMPLANT

## 2016-12-26 NOTE — Op Note (Signed)
12/26/2016  3:48 PM  PATIENT:  Katherine Lane  83 y.o. female  PRE-OPERATIVE DIAGNOSIS:  Closed wedge compression fracture of lumbar spine L 2, L 3, L 5 levels with intractable back pain  POST-OPERATIVE DIAGNOSIS:   Closed wedge compression fracture of lumbar spine L 2, L 3, L 5 levels with intractable back pain   PROCEDURE:  Procedure(s): Lumbar two Lumbar three and Lumbar five KYPHOPLASTY (N/A)  SURGEON:  Surgeon(s) and Role:    * Toma Arts, MD - Primary  PHYSICIAN ASSISTANT:   ASSISTANTS: none   ANESTHESIA:   general  EBL:  10 mL   BLOOD ADMINISTERED:none  DRAINS: none   LOCAL MEDICATIONS USED:  MARCAINE    and LIDOCAINE   SPECIMEN:  No Specimen  DISPOSITION OF SPECIMEN:  N/A  COUNTS:  YES  TOURNIQUET:  * No tourniquets in log *  DICTATION: DICTATION: Patient is 83 year old woman with new compression fractures and severe back pain, who has now developed a painful L 2, L 3,   L 5 compression fractures, which is proving debilitatingly painful to her.  It was elected to take her to surgery for kyphoplasty procedure.  PROCEDURE:  Following the smooth and uncomplicated induction of general endotracheal anesthesia, the patient was placed in a prone position on chest rolls.  C-arm fluoroscopy was positioned in both the AP and lateral planes, centered on the L 2, L 3,  and L 5 vertebrae.  Her back was prepped and draped in the usual sterile fashion with betadine scrub and Duraprep.  Using a right uni-pedicular approach, the L 2 and L 3 pedicles  were entered with the trochar using standard landmarks and trochar was advanced into the vertebral bodies.  The drill was used, followed by a 15 cc Kyphon balloon at each level, which was used to re-expand the broken vertebra.  Bi-pedicular approach was performed at L 5 with 20 cc balloons.  Subsequently, approximately 3.5  cc of bone cement was placed into the void created by the balloon and was seen to fill each broken vertebra in  both the AP and lateral direction with good interdigitation and without apparent extravasation at L 2 and L 3 levels and 7 cc of cement was used at L 5 utilizing the bi-pedicular trajectory. The bone void fillers were then removed.  Final X-ray demonstrated good filling within the fractured vertebrae.  The incisions were closed with a single 3-0 vicryl stitches at each site and dressed with Dermabond and an occlusive dressing. The patient was returned to the OR gurney and extubated in the OR and taken to Recovery in stable and satisfactory condition, having tolerated the procedure well.  Counts were correct at the end of the case.  PLAN OF CARE: Admit for overnight observation  PATIENT DISPOSITION:  PACU - hemodynamically stable.   Delay start of Pharmacological VTE agent (>24hrs) due to surgical blood loss or risk of bleeding: yes  

## 2016-12-26 NOTE — Anesthesia Postprocedure Evaluation (Signed)
Anesthesia Post Note  Patient: Katherine Lane  Procedure(s) Performed: Lumbar two Lumbar three and Lumbar five KYPHOPLASTY (N/A Spine Lumbar)     Patient location during evaluation: PACU Anesthesia Type: General Level of consciousness: awake Pain management: pain level controlled Vital Signs Assessment: post-procedure vital signs reviewed and stable Respiratory status: spontaneous breathing Cardiovascular status: stable Anesthetic complications: no    Last Vitals:  Vitals:   12/26/16 1537 12/26/16 1550  BP: (!) 171/80 (!) 173/86  Pulse: 80 75  Resp: 15 15  Temp: (!) 36.1 C   SpO2: 91% 98%    Last Pain:  Vitals:   12/26/16 1537  TempSrc:   PainSc: 0-No pain                 Kelci Petrella

## 2016-12-26 NOTE — Progress Notes (Addendum)
Spoke with Dr Chilton SiGreen about BP states she will be over soon.  PT States that she has to take Verapamil with food, so she did not take it today.

## 2016-12-26 NOTE — Progress Notes (Signed)
Awake, alert, conversant.  Back pain improved, but persists to a lesser degree.  MAEW.  Doing well.

## 2016-12-26 NOTE — Transfer of Care (Signed)
Immediate Anesthesia Transfer of Care Note  Patient: Katherine Lane  Procedure(s) Performed: Lumbar two Lumbar three and Lumbar five KYPHOPLASTY (N/A Spine Lumbar)  Patient Location: PACU  Anesthesia Type:General  Level of Consciousness: awake, alert  and patient cooperative  Airway & Oxygen Therapy: Patient Spontanous Breathing  Post-op Assessment: Report given to RN and Post -op Vital signs reviewed and stable  Post vital signs: Reviewed and stable  Last Vitals:  Vitals:   12/26/16 1335 12/26/16 1537  BP: (!) 202/99 (!) 171/80  Pulse:  80  Resp:  15  Temp:    SpO2:  91%    Last Pain:  Vitals:   12/26/16 1123  TempSrc:   PainSc: 7       Patients Stated Pain Goal: 2 (12/26/16 1123)  Complications: No apparent anesthesia complications

## 2016-12-26 NOTE — Brief Op Note (Signed)
12/26/2016  3:48 PM  PATIENT:  Katherine Lane  83 y.o. female  PRE-OPERATIVE DIAGNOSIS:  Closed wedge compression fracture of lumbar spine L 2, L 3, L 5 levels with intractable back pain  POST-OPERATIVE DIAGNOSIS:   Closed wedge compression fracture of lumbar spine L 2, L 3, L 5 levels with intractable back pain   PROCEDURE:  Procedure(s): Lumbar two Lumbar three and Lumbar five KYPHOPLASTY (N/A)  SURGEON:  Surgeon(s) and Role:    Maeola Harman* Calley Drenning, MD - Primary  PHYSICIAN ASSISTANT:   ASSISTANTS: none   ANESTHESIA:   general  EBL:  10 mL   BLOOD ADMINISTERED:none  DRAINS: none   LOCAL MEDICATIONS USED:  MARCAINE    and LIDOCAINE   SPECIMEN:  No Specimen  DISPOSITION OF SPECIMEN:  N/A  COUNTS:  YES  TOURNIQUET:  * No tourniquets in log *  DICTATION: DICTATION: Patient is 83 year old woman with new compression fractures and severe back pain, who has now developed a painful L 2, L 3,   L 5 compression fractures, which is proving debilitatingly painful to her.  It was elected to take her to surgery for kyphoplasty procedure.  PROCEDURE:  Following the smooth and uncomplicated induction of general endotracheal anesthesia, the patient was placed in a prone position on chest rolls.  C-arm fluoroscopy was positioned in both the AP and lateral planes, centered on the L 2, L 3,  and L 5 vertebrae.  Her back was prepped and draped in the usual sterile fashion with betadine scrub and Duraprep.  Using a right uni-pedicular approach, the L 2 and L 3 pedicles  were entered with the trochar using standard landmarks and trochar was advanced into the vertebral bodies.  The drill was used, followed by a 15 cc Kyphon balloon at each level, which was used to re-expand the broken vertebra.  Bi-pedicular approach was performed at L 5 with 20 cc balloons.  Subsequently, approximately 3.5  cc of bone cement was placed into the void created by the balloon and was seen to fill each broken vertebra in  both the AP and lateral direction with good interdigitation and without apparent extravasation at L 2 and L 3 levels and 7 cc of cement was used at L 5 utilizing the bi-pedicular trajectory. The bone void fillers were then removed.  Final X-ray demonstrated good filling within the fractured vertebrae.  The incisions were closed with a single 3-0 vicryl stitches at each site and dressed with Dermabond and an occlusive dressing. The patient was returned to the OR gurney and extubated in the OR and taken to Recovery in stable and satisfactory condition, having tolerated the procedure well.  Counts were correct at the end of the case.  PLAN OF CARE: Admit for overnight observation  PATIENT DISPOSITION:  PACU - hemodynamically stable.   Delay start of Pharmacological VTE agent (>24hrs) due to surgical blood loss or risk of bleeding: yes

## 2016-12-26 NOTE — Progress Notes (Signed)
Called into room 20 spoke with Huntley DecSara informed of BP also informed Dr Chilton SiGreen of BP 202/99

## 2016-12-26 NOTE — Anesthesia Preprocedure Evaluation (Addendum)
Anesthesia Evaluation  Patient identified by MRN, date of birth, ID band Patient awake    Reviewed: Allergy & Precautions, NPO status , Patient's Chart, lab work & pertinent test results  History of Anesthesia Complications (+) PONV  Airway Mallampati: II  TM Distance: >3 FB     Dental   Pulmonary    breath sounds clear to auscultation       Cardiovascular hypertension, +CHF   Rhythm:Regular Rate:Normal     Neuro/Psych    GI/Hepatic Neg liver ROS, GERD  ,  Endo/Other  negative endocrine ROS  Renal/GU negative Renal ROS     Musculoskeletal  (+) Arthritis ,   Abdominal   Peds  Hematology   Anesthesia Other Findings   Reproductive/Obstetrics                             Anesthesia Physical Anesthesia Plan  ASA: III  Anesthesia Plan: General   Post-op Pain Management:    Induction: Intravenous  PONV Risk Score and Plan: 4 or greater and Ondansetron, Dexamethasone and Treatment may vary due to age or medical condition  Airway Management Planned: Oral ETT  Additional Equipment:   Intra-op Plan:   Post-operative Plan: Possible Post-op intubation/ventilation  Informed Consent: I have reviewed the patients History and Physical, chart, labs and discussed the procedure including the risks, benefits and alternatives for the proposed anesthesia with the patient or authorized representative who has indicated his/her understanding and acceptance.   Dental advisory given  Plan Discussed with: CRNA, Anesthesiologist and Surgeon  Anesthesia Plan Comments:        Anesthesia Quick Evaluation

## 2016-12-26 NOTE — Anesthesia Procedure Notes (Signed)
Procedure Name: Intubation Date/Time: 12/26/2016 2:09 PM Performed by: White, Amedeo Plenty, CRNA Pre-anesthesia Checklist: Patient identified, Emergency Drugs available, Suction available and Patient being monitored Patient Re-evaluated:Patient Re-evaluated prior to induction Oxygen Delivery Method: Circle System Utilized Preoxygenation: Pre-oxygenation with 100% oxygen Induction Type: IV induction Ventilation: Mask ventilation without difficulty Laryngoscope Size: Mac and 3 Grade View: Grade III Tube type: Oral Tube size: 7.0 mm Number of attempts: 1 Airway Equipment and Method: Stylet and Oral airway Placement Confirmation: ETT inserted through vocal cords under direct vision,  positive ETCO2 and breath sounds checked- equal and bilateral Secured at: 22 cm Tube secured with: Tape Dental Injury: Teeth and Oropharynx as per pre-operative assessment

## 2016-12-26 NOTE — Progress Notes (Signed)
Pharmacy called to check on Katherine Lane states it needs to be checked and then I will send it .

## 2016-12-26 NOTE — Progress Notes (Signed)
Patient stated that she does not tolerate ANY opioid and would not want to take any pain medication other than tylenol. Her reaction is severe vomiting. Discussed with RN who is aware - will d/c pain med orders per patient preference. RN aware that she will need to discuss pain management with MD to get orders if the patient does need more than tylenol to manage her pain. RN states MD was also already aware. Will d/c Norco and Morphine orders per patient request.  Patient and RN aware.   Georgina PillionElizabeth Salaya Lane, PharmD, BCPS Pager: [phone] 5:12 PM

## 2016-12-26 NOTE — Interval H&P Note (Signed)
History and Physical Interval Note:  12/26/2016 10:54 AM  Katherine RicksBetty M Campanile  has presented today for surgery, with the diagnosis of Closed wedge compression fracture of lumbar spine  The various methods of treatment have been discussed with the patient and family. After consideration of risks, benefits and other options for treatment, the patient has consented to  Procedure(s) with comments: L2 L3 and L5 KYPHOPLASTY (N/A) - L2 L3 and L5 KYPHOPLASTY as a surgical intervention .  The patient's history has been reviewed, patient examined, no change in status, stable for surgery.  I have reviewed the patient's chart and labs.  Questions were answered to the patient's satisfaction.     Jacquis Paxton D

## 2016-12-27 DIAGNOSIS — M4856XA Collapsed vertebra, not elsewhere classified, lumbar region, initial encounter for fracture: Secondary | ICD-10-CM | POA: Diagnosis not present

## 2016-12-27 NOTE — Discharge Summary (Signed)
Physician Discharge Summary  Patient ID: Harvel RicksBetty M Hestand MRN: [id number] DOB/AGE: Sep 12, 1932 83 y.o.  Admit date: 12/26/2016 Discharge date: 12/27/2016  Admission Diagnoses:  Discharge Diagnoses:  Active Problems:   Lumbar compression fracture [facility](HCC)   Discharged Condition: good  Hospital Course: Patient admitted to the hospital where she underwent uncomplicated treatment of her thoracic and lumbar compression fractures. Postoperative doing well. Patient reports no pain. No symptoms of numbness or weakness. Ambulating without difficulty and ready for discharge home.  Consults:   Significant Diagnostic Studies:   Treatments:   Discharge Exam: Blood pressure 140/74, pulse 79, temperature 98.1 F (36.7 C), temperature source Oral, resp. rate 18, height 5\' 4"  (1.626 m), weight 62.6 kg (138 lb), SpO2 97 %. Awake and alert. Oriented and appropriate. Speech fluent. Judgment and insight intact. Cranial nerve function normal. Motor and sensory function extremities normal. Wound clean and dry. Chest and abdomen benign.  Disposition: 01-Home or Self Care   Allergies as of 12/27/2016      Reactions   Codeine Nausea And Vomiting   Patient states intolerance to all pain medications (NO OPIOIDS) VERY VIOLENT VOMITING!!   Other Nausea And Vomiting   general anesthesia   Tramadol Nausea And Vomiting   NO OPIOIDS!!! VERY VIOLENT VOMITING!!      Medication List    STOP taking these medications   nystatin powder Commonly known as:  MYCOSTATIN/NYSTOP     TAKE these medications   acetaminophen 500 MG tablet Commonly known as:  TYLENOL Take 1,000 mg 2 (two) times daily as needed by mouth for moderate pain.   ANALPRAM-HC 1-1 % rectal cream Generic drug:  pramoxine-hydrocortisone APPLY TO AFFECTED AREAS TWICE DAILY. What changed:  See the new instructions.   furosemide 20 MG tablet Commonly known as:  LASIX Take 1 tablet (20 mg total) by mouth daily as needed for fluid or edema. For  weight gain of 3 Lbs in 1 day or 5 Lbs in 2 days.   LORazepam 0.5 MG tablet Commonly known as:  ATIVAN TAKE ONE TABLET DAILY AS NEEDED. What changed:  See the new instructions.   losartan 50 MG tablet Commonly known as:  COZAAR Take 1 tablet (50 mg total) by mouth daily. What changed:  when to take this   LUBRICATING EYE DROPS OP Apply 1 drop 3 (three) times daily to eye.   metoprolol succinate 25 MG 24 hr tablet Commonly known as:  TOPROL-XL Take 25 mg by mouth in the morning What changed:    how much to take  how to take this  when to take this  additional instructions   MIRALAX powder Generic drug:  polyethylene glycol powder Take 17 g daily as needed by mouth for moderate constipation.   potassium chloride SA 20 MEQ tablet Commonly known as:  K-DUR,KLOR-CON Take 0.5 tablets (10 mEq total) by mouth 2 (two) times daily. What changed:    how much to take  when to take this  reasons to take this  additional instructions   rivaroxaban 20 MG Tabs tablet Commonly known as:  XARELTO TAKE 1 TABLET DAILY WITH SUPPER.   verapamil 240 MG CR tablet Commonly known as:  CALAN-SR Take 240 mg by mouth in the morning   zolpidem 10 MG tablet Commonly known as:  AMBIEN TAKE 1 TABLET BY MOUTH AT BEDTIME FOR SLEEP.        Signed: Zyler Hyson A 12/27/2016, 9:08 AM

## 2016-12-27 NOTE — Evaluation (Signed)
Physical Therapy Evaluation & Discharge Patient Details Name: Katherine Lane MRN: [id number] DOB: [birth year] Today's Date: 12/27/2016   History of Present Illness  Pt is an 83 y/o female s/p with L2, L3, and L5 KYPHOPLASTY   Clinical Impression  Pt presented sitting OOB in recliner chair, awake and willing to participate in therapy session. Pt's daughter present throughout session. Prior to admission, pt reported that she has been ambulating with either a SPC or RW for the past month secondary to pain. Pt stated that she has been able to continue to perform ADLs independently. Pt ambulated in hallway with supervision without use of an AD. Pt successfully completed stair training this session as well. Pt able to recall 3/3 back precautions independently. No further acute PT needs identified at this time. PT signing off.      Follow Up Recommendations No PT follow up;Supervision/Assistance - 24 hour    Equipment Recommendations  None recommended by PT    Recommendations for Other Services       Precautions / Restrictions Precautions Precautions: Back Precaution Booklet Issued: No(OT provided) Precaution Comments: pt able to recall 3/3 back precautions Required Braces or Orthoses: Other Brace/Splint Other Brace/Splint: No brace per MD order Restrictions Weight Bearing Restrictions: No      Mobility  Bed Mobility               General bed mobility comments: pt OOB upon arrival  Transfers Overall transfer level: Needs assistance Equipment used: None Transfers: Sit to/from Stand Sit to Stand: Supervision         General transfer comment: for safety  Ambulation/Gait Ambulation/Gait assistance: Supervision Ambulation Distance (Feet): 400 Feet Assistive device: None Gait Pattern/deviations: Step-through pattern;Decreased stride length Gait velocity: decreased   General Gait Details: mildly unsteady gait pattern but no overt LOB or need for physical assistance,  supervision for safety  Stairs Stairs: Yes Stairs assistance: Min guard Stair Management: One rail Right;Alternating pattern;Step to pattern;Forwards Number of Stairs: 3 General stair comments: no instability or LOB, min guard for safety  Wheelchair Mobility    Modified Rankin (Stroke Patients Only)       Balance Overall balance assessment: Needs assistance Sitting-balance support: Feet supported Sitting balance-Leahy Scale: Normal     Standing balance support: During functional activity;No upper extremity supported Standing balance-Leahy Scale: Good                               Pertinent Vitals/Pain Pain Assessment: 0-10 Pain Score: 4  Pain Location: back Pain Descriptors / Indicators: Sore Pain Intervention(s): Monitored during session;Repositioned    Home Living Family/patient expects to be discharged to:: Private residence Living Arrangements: Alone Available Help at Discharge: Family;Friend(s);Available PRN/intermittently Type of Home: House Home Access: Stairs to enter Entrance Stairs-Rails: Doctor, general practiceight;Left Entrance Stairs-Number of Steps: 2 Home Layout: One level Home Equipment: Walker - 2 wheels;Cane - single point;Shower seat;Bedside commode;Grab bars - tub/shower;Adaptive equipment;Hand held shower head      Prior Function Level of Independence: Independent with assistive device(s)         Comments: for the past month pt has been ambulating with either a SPC or RW secondary to pain     Hand Dominance   Dominant Hand: Right    Extremity/Trunk Assessment   Upper Extremity Assessment Upper Extremity Assessment: Defer to OT evaluation    Lower Extremity Assessment Lower Extremity Assessment: Overall WFL for tasks assessed    Cervical /  Trunk Assessment Cervical / Trunk Assessment: Kyphotic;Other exceptions Cervical / Trunk Exceptions: s/p lumbar kyphoplasty  Communication   Communication: No difficulties  Cognition  Arousal/Alertness: Awake/alert Behavior During Therapy: WFL for tasks assessed/performed Overall Cognitive Status: Within Functional Limits for tasks assessed                                        General Comments      Exercises     Assessment/Plan    PT Assessment Patent does not need any further PT services  PT Problem List         PT Treatment Interventions      PT Goals (Current goals can be found in the Care Plan section)  Acute Rehab PT Goals Patient Stated Goal: return home ASAP    Frequency     Barriers to discharge        Co-evaluation               AM-PAC PT "6 Clicks" Daily Activity  Outcome Measure Difficulty turning over in bed (including adjusting bedclothes, sheets and blankets)?: A Little Difficulty moving from lying on back to sitting on the side of the bed? : A Little Difficulty sitting down on and standing up from a chair with arms (e.g., wheelchair, bedside commode, etc,.)?: None Help needed moving to and from a bed to chair (including a wheelchair)?: None Help needed walking in hospital room?: None Help needed climbing 3-5 steps with a railing? : A Little 6 Click Score: 21    End of Session   Activity Tolerance: Patient tolerated treatment well Patient left: in chair;with call bell/phone within reach;with family/visitor present Nurse Communication: Mobility status PT Visit Diagnosis: Other abnormalities of gait and mobility (R26.89);Pain Pain - part of body: (back)    Time: [id number] PT Time Calculation (min) (ACUTE ONLY): 14 min   Charges:   PT Evaluation $PT Eval Moderate Complexity: 1 Mod     PT G Codes:   PT G-Codes **NOT FOR INPATIENT CLASS** Functional Assessment Tool Used: AM-PAC 6 Clicks Basic Mobility;Clinical judgement Functional Limitation: Mobility: Walking and moving around Mobility: Walking and Moving Around Current Status ([id number]): At least 20 percent but less than 40 percent impaired, limited or  restricted Mobility: Walking and Moving Around Goal Status [phone]([id number]): 0 percent impaired, limited or restricted Mobility: Walking and Moving Around Discharge Status [phone]): At least 20 percent but less than 40 percent impaired, limited or restricted    Oak Hill HospitalJennifer Mikesha Migliaccio, [place]PT, DPT [phone]   Katherine Lane 12/27/2016, 8:45 AM

## 2016-12-27 NOTE — Evaluation (Signed)
Occupational Therapy Evaluation Patient Details Name: NEIRA BENTSEN MRN: [id number] DOB: Nov 04, 1932 Today's Date: 12/27/2016    History of Present Illness Pt is an 83 y/o female s/p with L2, L3, and L5 KYPHOPLASTY    Clinical Impression   PTA, pt was living alone and was independent; reports that daughter will be present at dc. Currently, pt requires supervision-Min guard for LB DLs and functional mobility. Provided education and handout on back precautions, LB ADLs, toilet transfer, and shower transfer with shower seat; pt demonstrated understanding. Answered all pt questions. Recommend dc home once medically stable per physician. All acute OT needs met and will sign off. Thank you.     Follow Up Recommendations  No OT follow up;Supervision/Assistance - 24 hour    Equipment Recommendations  None recommended by OT    Recommendations for Other Services PT consult     Precautions / Restrictions Precautions Precautions: Back Precaution Booklet Issued: Yes (comment) Precaution Comments: pt able to recall 3/3 back precautions Required Braces or Orthoses: Other Brace/Splint Other Brace/Splint: No brace per MD order Restrictions Weight Bearing Restrictions: No      Mobility Bed Mobility Overal bed mobility: Needs Assistance Bed Mobility: Rolling;Sidelying to Sit;Sit to Sidelying Rolling: Supervision Sidelying to sit: Supervision     Sit to sidelying: Supervision General bed mobility comments: supervision for safety. Education on log roll technique  Transfers Overall transfer level: Needs assistance Equipment used: None Transfers: Sit to/from Stand Sit to Stand: Supervision         General transfer comment: for safety    Balance Overall balance assessment: Needs assistance Sitting-balance support: Feet supported Sitting balance-Leahy Scale: Normal     Standing balance support: During functional activity;No upper extremity supported Standing balance-Leahy  Scale: Good                             ADL either performed or assessed with clinical judgement   ADL Overall ADL's : Needs assistance/impaired                                       General ADL Comments: Supervision-Min Guard for ADLs and fucntional mobility. Provided education on Back precautions, LB ADLs, toileting, and shower transfer     Vision Baseline Vision/History: Wears glasses Wears Glasses: At all times Patient Visual Report: No change from baseline       Perception     Praxis      Pertinent Vitals/Pain Pain Assessment: 0-10 Pain Score: 4  Pain Location: back Pain Descriptors / Indicators: Sore Pain Intervention(s): Monitored during session;Repositioned     Hand Dominance Right   Extremity/Trunk Assessment Upper Extremity Assessment Upper Extremity Assessment: Overall WFL for tasks assessed   Lower Extremity Assessment Lower Extremity Assessment: Defer to PT evaluation   Cervical / Trunk Assessment Cervical / Trunk Assessment: Other exceptions Cervical / Trunk Exceptions: s/p lumbar kyphoplasty   Communication Communication Communication: No difficulties   Cognition Arousal/Alertness: Awake/alert Behavior During Therapy: WFL for tasks assessed/performed Overall Cognitive Status: Within Functional Limits for tasks assessed                                     General Comments  Daughter present throughout session    Exercises     Shoulder Instructions  Home Living Family/patient expects to be discharged to:: Private residence Living Arrangements: Alone Available Help at Discharge: Family;Friend(s);Available PRN/intermittently Type of Home: House Home Access: Stairs to enter [employer] of Steps: 2 Entrance Stairs-Rails: Right;Left Home Layout: One level     Bathroom Shower/Tub: Walk-in shower;Tub/shower unit   Bathroom Toilet: Standard     Home Equipment: Environmental consultant - 2  wheels;Cane - single point;Shower seat;Bedside commode;Grab bars - tub/shower;Adaptive equipment;Hand held shower head Adaptive Equipment: Reacher;Sock aid        Prior Functioning/Environment Level of Independence: Independent with assistive device(s)        Comments: ADLs, IADLs, and for the past month pt has been ambulating with either a SPC or RW secondary to pain        OT Problem List: Decreased range of motion;Impaired balance (sitting and/or standing);Decreased safety awareness;Decreased knowledge of use of DME or AE;Decreased knowledge of precautions;Pain      OT Treatment/Interventions:      OT Goals(Current goals can be found in the care plan section) Acute Rehab OT Goals Patient Stated Goal: return home ASAP OT Goal Formulation: With patient Time For Goal Achievement: 01/10/17 Potential to Achieve Goals: Good  OT Frequency:     Barriers to D/C:            Co-evaluation              AM-PAC PT "6 Clicks" Daily Activity     Outcome Measure Help from another person eating meals?: None Help from another person taking care of personal grooming?: None Help from another person toileting, which includes using toliet, bedpan, or urinal?: A Little Help from another person bathing (including washing, rinsing, drying)?: A Little Help from another person to put on and taking off regular upper body clothing?: None Help from another person to put on and taking off regular lower body clothing?: A Little 6 Click Score: 21   End of Session Nurse Communication: Mobility status  Activity Tolerance: Patient tolerated treatment well Patient left: in chair;with call bell/phone within reach;with family/visitor present  OT Visit Diagnosis: Unsteadiness on feet (R26.81);Muscle weakness (generalized) (M62.81);Pain Pain - Right/Left: (Back) Pain - part of body: (Back)                Time: 2774-1287 OT Time Calculation (min): 16 min Charges:  OT General Charges $OT Visit: 1  Visit OT Evaluation $OT Eval Low Complexity: 1 Low G-Codes: OT G-codes **NOT FOR INPATIENT CLASS** Functional Assessment Tool Used: Clinical judgement Functional Limitation: Self care Self Care Current Status (O6767): At least 1 percent but less than 20 percent impaired, limited or restricted Self Care Goal Status (M0947): 0 percent impaired, limited or restricted Self Care Discharge Status [phone]): At least 1 percent but less than 20 percent impaired, limited or restricted   [place], OTR/L Acute Rehab Pager: [phone] Office: [place] 12/27/2016, 9:01 AM

## 2016-12-27 NOTE — Discharge Instructions (Signed)

## 2016-12-27 NOTE — Progress Notes (Signed)
Patient alert and oriented, mae's well, voiding adequate amount of urine, swallowing without difficulty, no c/o pain at time of discharge. Patient discharged home with family. Script and discharged instructions given to patient. Patient and family stated understanding of instructions given. Patient has an appointment with Dr.Stern    

## 2016-12-29 ENCOUNTER — Encounter (HOSPITAL_COMMUNITY): Payer: Self-pay | Admitting: Neurosurgery

## 2017-01-05 ENCOUNTER — Ambulatory Visit: Payer: Medicare Other | Admitting: Cardiovascular Disease

## 2017-01-05 ENCOUNTER — Encounter: Payer: Self-pay | Admitting: Cardiovascular Disease

## 2017-01-05 VITALS — BP 180/80 | HR 75 | Ht 63.0 in | Wt 138.6 lb

## 2017-01-05 DIAGNOSIS — I10 Essential (primary) hypertension: Secondary | ICD-10-CM

## 2017-01-05 DIAGNOSIS — F419 Anxiety disorder, unspecified: Secondary | ICD-10-CM

## 2017-01-05 DIAGNOSIS — I4819 Other persistent atrial fibrillation: Secondary | ICD-10-CM

## 2017-01-05 DIAGNOSIS — I481 Persistent atrial fibrillation: Secondary | ICD-10-CM

## 2017-01-05 DIAGNOSIS — I5032 Chronic diastolic (congestive) heart failure: Secondary | ICD-10-CM | POA: Diagnosis not present

## 2017-01-05 MED ORDER — LOSARTAN POTASSIUM 50 MG PO TABS: 50.0000 mg | ORAL_TABLET | Freq: Two times a day (BID) | ORAL | 3 refills | Status: DC

## 2017-01-05 NOTE — Progress Notes (Signed)
SUBJECTIVE: The patient presents for routine follow-up.  She recently underwent kyphoplasty for closed wedge compression fractures of L2, L3, and L5 due to intractable back pain on 12/26/16.  She has a history of malignant hypertension, chronic diastolic heart failure, chronic anxiety, and paroxysmal atrial fibrillation.  Echocardiogram 07/01/16: Vigorous left ventricular systolic function, LVEF 65-70%, mild LVH, mild mitral, aortic, and tricuspid regurgitation.  She had been in excruciating back pain for about a month and a half but this has considerably improved.  Prior to surgery, she tells me her blood pressures got as high as 220/97.  Since surgery, she has had pressures up to 151/83.  She denies chest pain, shortness of breath, leg swelling, and palpitations.  She was happy to tell me that her granddaughter, Rodney Booze, is now engaged.    Review of Systems: As per "subjective", otherwise negative.  Allergies  Allergen Reactions  . Codeine Nausea And Vomiting    Patient states intolerance to all pain medications (NO OPIOIDS) VERY VIOLENT VOMITING!!  . Other Nausea And Vomiting    general anesthesia  . Tramadol Nausea And Vomiting    NO OPIOIDS!!! VERY VIOLENT VOMITING!!    Current Outpatient Medications  Medication Sig Dispense Refill  . acetaminophen (TYLENOL) 500 MG tablet Take 1,000 mg 2 (two) times daily as needed by mouth for moderate pain.    . ANALPRAM-HC 1-1 % rectal cream APPLY TO AFFECTED AREAS TWICE DAILY. (Patient taking differently: APPLY TO AFFECTED AREAS TWICE DAILY AS NEEDED FOR HEMORRHOIDS) 28.4 g prn  . Carboxymethylcellul-Glycerin (LUBRICATING EYE DROPS OP) Apply 1 drop 3 (three) times daily to eye.    . furosemide (LASIX) 20 MG tablet Take 1 tablet (20 mg total) by mouth daily as needed for fluid or edema. For weight gain of 3 Lbs in 1 day or 5 Lbs in 2 days. 90 tablet 3  . LORazepam (ATIVAN) 0.5 MG tablet TAKE ONE TABLET DAILY AS NEEDED. (Patient taking  differently: Take 0.5 mg by mouth once daily in the evening) 30 tablet 5  . losartan (COZAAR) 50 MG tablet Take 1 tablet (50 mg total) by mouth daily. (Patient taking differently: Take 50 mg at bedtime by mouth. ) 90 tablet 3  . metoprolol succinate (TOPROL-XL) 25 MG 24 hr tablet Take 25 mg by mouth in the morning (Patient taking differently: Take 25 mg daily with lunch by mouth. ) 90 tablet 3  . polyethylene glycol powder (MIRALAX) powder Take 17 g daily as needed by mouth for moderate constipation.     . potassium chloride SA (K-DUR,KLOR-CON) 20 MEQ tablet Take 0.5 tablets (10 mEq total) by mouth 2 (two) times daily. (Patient taking differently: Take 20 mEq daily as needed by mouth (swelling). Take with lasix) 180 tablet 3  . rivaroxaban (XARELTO) 20 MG TABS tablet TAKE 1 TABLET DAILY WITH SUPPER. 90 tablet 2  . verapamil (CALAN-SR) 240 MG CR tablet Take 240 mg by mouth in the morning 90 tablet 3  . zolpidem (AMBIEN) 10 MG tablet TAKE 1 TABLET BY MOUTH AT BEDTIME FOR SLEEP. 30 tablet 5   No current facility-administered medications for this visit.     Past Medical History:  Diagnosis Date  . A-fib (HCC)   . Anxiety   . Arthritis   . CHF (congestive heart failure) (HCC)   . Chronic back pain   . Constipation, chronic   . Depression   . Family history of adverse reaction to anesthesia    Mother, Sister,  daughter- N/V  . History of blood transfusion    "pregnancy"  . HTN (hypertension)   . Insomnia   . Migraine headache   . Osteoporosis   . PONV (postoperative nausea and vomiting)    "violent"  . Pulmonary edema 06/2016  . Sodium (Na) deficiency   . Trigeminal neuralgia     Past Surgical History:  Procedure Laterality Date  . ANKLE FRACTURE SURGERY Right   . CARDIAC CATHETERIZATION     denies  . CATARACT EXTRACTION PHACO AND INTRAOCULAR LENS PLACEMENT (IOC) Left 08/09/2012   Performed by Gemma PayorHunt, Kerry, MD at [facility]  . CATARACT EXTRACTION PHACO AND INTRAOCULAR LENS PLACEMENT  (IOC) Right 07/22/2012   Performed by Gemma PayorHunt, Kerry, MD at [facility]  . COLONOSCOPY    . HARDWARE REMOVAL Right 07/18/2011   Performed by Vickki HearingHarrison, Stanley E, MD at [facility]  . Lumbar two Lumbar three and Lumbar five KYPHOPLASTY N/A 12/26/2016   [facility] Mae[facility]Stern, Joseph[facility]  . NERVE REPAIR Right 07/18/2011   Performed by Vickki HearingHarrison, Stanley E, MD at [facility]  . PARTIAL HYSTERECTOMY      Social History   Socioeconomic History  . Marital status: Widowed    Spouse name: Not on file  . Number of children: Not on file  . Years of education: Not on file  . Highest education level: Not on file  Social Needs  . Financial resource strain: Not on file  . Food insecurity - worry: Not on file  . Food insecurity - inability: Not on file  . Transportation needs - medical: Not on file  . Transportation needs - non-medical: Not on file  Occupational History  . Occupation: retired    Associate Professormployer: RETIRED  Tobacco Use  . Smoking status: Never Smoker  . Smokeless tobacco: Never Used  Substance and Sexual Activity  . Alcohol use: No    Alcohol/week: 0.0 oz  . Drug use: No  . Sexual activity: Not Currently    Birth control/protection: Surgical    Comment: hyst  Other Topics Concern  . Not on file  Social History Narrative  . Not on file     Vitals:   01/05/17 0952  BP: (!) 180/80  Pulse: 75  SpO2: 96%  Weight: 138 lb 9.6 oz (62.9 kg)  Height: 5\' 3"  (1.6 m)    Wt Readings from Last 3 Encounters:  01/05/17 138 lb 9.6 oz (62.9 kg)  12/26/16 138 lb (62.6 kg)  09/02/16 138 lb (62.6 kg)     PHYSICAL EXAM General: NAD HEENT: Normal. Neck: No JVD, no thyromegaly. Lungs: Clear to auscultation bilaterally with normal respiratory effort. CV: Regular rate and rhythm, normal S1/S2, no S3/S4, no murmur. No pretibial or periankle edema.  Abdomen: Soft, nontender, no distention.  Neurologic: Alert and oriented.  Psych: Normal affect. Skin: Normal. Musculoskeletal: No gross  deformities.    ECG: Most recent ECG reviewed.   Labs: Lab Results  Component Value Date/Time   K 3.9 12/26/2016 12:04 PM   BUN 15 12/26/2016 12:04 PM   BUN 14 07/18/2016 09:55 AM   CREATININE 0.87 12/26/2016 12:04 PM   CREATININE 0.90 10/18/2014 10:38 AM   ALT 16 06/30/2016 09:23 PM   TSH 1.845 12/06/2015 11:41 AM   TSH 2.200 09/24/2015 08:37 AM   HGB 13.2 12/26/2016 12:04 PM   HGB 13.2 09/24/2015 08:37 AM     Lipids: Lab Results  Component Value Date/Time   LDLCALC 93 09/24/2015 08:37 AM  CHOL 177 09/24/2015 08:37 AM   TRIG 133 09/24/2015 08:37 AM   HDL 57 09/24/2015 08:37 AM       ASSESSMENT AND PLAN:  1. Persistentatrial fibrillation: Symptomatically stable on verapamil and metoprolol succinate. Continue Xarelto.She is in a regular rhythm today.   2. MalignantHTN: Markedly elevated on Toprol-XL 25 mg daily, losartan 50 mg daily, and long-acting verapamil 240 mg daily. No changes. I will increase losartan to 50 mg twice daily.  3. Anxiety:She has prnAtivan. She may require something on a daily basis. I will defer to PCP regarding this.  4.Chronic diastolic heart failure: Currently euvolemic. Takes Lasix as needed. I have asked her to continue to weigh herself daily. I will aim to control BP.     Disposition: Follow up 6 months   Prentice DockerSuresh Virgia Kelner, M.D., F.A.C.C.

## 2017-01-05 NOTE — Patient Instructions (Signed)
Your physician wants you to follow-up in: 6 months with Dr.Koneswaran You will receive a reminder letter in the mail two months in advance. If you don't receive a letter, please call our office to schedule the follow-up appointment.     INCREASE Losartan to 50 mg TWICE a day    No lab work or tests today.       Thank you for choosing [facility] !

## 2017-01-07 ENCOUNTER — Ambulatory Visit: Payer: Medicare Other | Admitting: Nurse Practitioner

## 2017-01-07 ENCOUNTER — Encounter: Payer: Self-pay | Admitting: Nurse Practitioner

## 2017-01-07 VITALS — BP 112/80 | Ht 61.0 in | Wt 139.0 lb

## 2017-01-07 DIAGNOSIS — Z23 Encounter for immunization: Secondary | ICD-10-CM

## 2017-01-07 DIAGNOSIS — R300 Dysuria: Secondary | ICD-10-CM | POA: Diagnosis not present

## 2017-01-07 DIAGNOSIS — K644 Residual hemorrhoidal skin tags: Secondary | ICD-10-CM | POA: Diagnosis not present

## 2017-01-07 LAB — POCT URINALYSIS DIPSTICK
Blood, UA: NEGATIVE
Spec Grav, UA: <=1.005 — AB (ref 1.010–1.025)
pH, UA: 5 (ref 5.0–8.0)

## 2017-01-07 MED ORDER — HYDROCORTISONE ACE-PRAMOXINE 2.5-1 % EX CREA: TOPICAL_CREAM | CUTANEOUS | 0 refills | Status: DC

## 2017-01-07 NOTE — Patient Instructions (Addendum)
Consider IV Boniva or Reclast for osteoporosis    Hemorrhoids Hemorrhoids are swollen veins in and around the rectum or anus. There are two types of hemorrhoids:  Internal hemorrhoids. These occur in the veins that are just inside the rectum. They may poke through to the outside and become irritated and painful.  External hemorrhoids. These occur in the veins that are outside of the anus and can be felt as a painful swelling or hard lump near the anus.  Most hemorrhoids do not cause serious problems, and they can be managed with home treatments such as diet and lifestyle changes. If home treatments do not help your symptoms, procedures can be done to shrink or remove the hemorrhoids. What are the causes? This condition is caused by increased pressure in the anal area. This pressure may result from various things, including:  Constipation.  Straining to have a bowel movement.  Diarrhea.  Pregnancy.  Obesity.  Sitting for long periods of time.  Heavy lifting or other activity that causes you to strain.  Anal sex.  What are the signs or symptoms? Symptoms of this condition include:  Pain.  Anal itching or irritation.  Rectal bleeding.  Leakage of stool (feces).  Anal swelling.  One or more lumps around the anus.  How is this diagnosed? This condition can often be diagnosed through a visual exam. Other exams or tests may also be done, such as:  Examination of the rectal area with a gloved hand (digital rectal exam).  Examination of the anal canal using a small tube (anoscope).  A blood test, if you have lost a significant amount of blood.  A test to look inside the colon (sigmoidoscopy or colonoscopy).  How is this treated? This condition can usually be treated at home. However, various procedures may be done if dietary changes, lifestyle changes, and other home treatments do not help your symptoms. These procedures can help make the hemorrhoids smaller or remove  them completely. Some of these procedures involve surgery, and others do not. Common procedures include:  Rubber band ligation. Rubber bands are placed at the base of the hemorrhoids to cut off the blood supply to them.  Sclerotherapy. Medicine is injected into the hemorrhoids to shrink them.  Infrared coagulation. A type of light energy is used to get rid of the hemorrhoids.  Hemorrhoidectomy surgery. The hemorrhoids are surgically removed, and the veins that supply them are tied off.  Stapled hemorrhoidopexy surgery. A circular stapling device is used to remove the hemorrhoids and use staples to cut off the blood supply to them.  Follow these instructions at home: Eating and drinking  Eat foods that have a lot of fiber in them, such as whole grains, beans, nuts, fruits, and vegetables. Ask your health care provider about taking products that have added fiber (fiber supplements).  Drink enough fluid to keep your urine clear or pale yellow. Managing pain and swelling  Take warm sitz baths for 20 minutes, 3-4 times a day to ease pain and discomfort.  If directed, apply ice to the affected area. Using ice packs between sitz baths may be helpful. ? Put ice in a plastic bag. ? Place a towel between your skin and the bag. ? Leave the ice on for 20 minutes, 2-3 times a day. General instructions  Take over-the-counter and prescription medicines only as told by your health care provider.  Use medicated creams or suppositories as told.  Exercise regularly.  Go to the bathroom when you have  the urge to have a bowel movement. Do not wait.  Avoid straining to have bowel movements.  Keep the anal area dry and clean. Use wet toilet paper or moist towelettes after a bowel movement.  Do not sit on the toilet for long periods of time. This increases blood pooling and pain. Contact a health care provider if:  You have increasing pain and swelling that are not controlled by treatment or  medicine.  You have uncontrolled bleeding.  You have difficulty having a bowel movement, or you are unable to have a bowel movement.  You have pain or inflammation outside the area of the hemorrhoids. This information is not intended to replace advice given to you by your health care provider. Make sure you discuss any questions you have with your health care provider. Document Released: 02/01/2000 Document Revised: 07/04/2015 Document Reviewed: 10/18/2014 Elsevier Interactive Patient Education  2017 [employer].

## 2017-01-09 ENCOUNTER — Encounter: Payer: Self-pay | Admitting: Nurse Practitioner

## 2017-01-09 LAB — URINE CULTURE

## 2017-01-09 LAB — SPECIMEN STATUS REPORT

## 2017-01-09 NOTE — Progress Notes (Signed)
Subjective:  Presents for c/o bright red bleeding most likely from the rectal area. Has history of hemorrhoids. Having rectal pain. Difficulty sitting down. Bowels are soft. No abdominal pain or fever. Has had a partial hysterectomy. No new sexual partners. No vaginal pain or discharge. Mild dysuria and urinary frequency.   Objective:   BP 112/80   Ht 5\' 1"  (1.549 m)   Wt 139 lb (63 kg)   BMI 26.26 kg/m  NAD. Alert, oriented. Lungs clear. Heart RRR. Abdomen soft, non distended, non tender.  External GU pale, no lesions or bleeding noted.  Vagina pale slightly dry, no lesions or bleeding noted.  A large slightly flat older hemorrhoid noted to the right of the anal area.  A smaller protruding hemorrhoid noted as well.  No active bleeding.  Tender to palpation.  Assessment: External hemorrhoids without complication - Plan: POCT urinalysis dipstick  Need for influenza vaccination - Plan: Flu Vaccine QUAD 36+ mos IM  Dysuria - Plan: Urine Culture    Plan:   Meds ordered this encounter  Medications  . Pramoxine-HC (HYDROCORTISONE ACE-PRAMOXINE) 2.5-1 % CREA    Sig: Apply to hemorrhoids QID prn    Dispense:  1 Tube    Refill:  0    Order Specific Question:   Supervising Provider    Answer:   Merlyn AlbertLUKING, WILLIAM S [[id number]]   Discussed nonpharmacologic measures to help with hemorrhoids including warm sits baths.  Warning signs reviewed.  Call back early next week if no improvement, sooner if worse.  Also consider permanent measures to help with hemorrhoids.  Urine culture pending.

## 2017-01-14 ENCOUNTER — Telehealth: Payer: Self-pay | Admitting: Family Medicine

## 2017-01-14 NOTE — Telephone Encounter (Signed)
Pt is needing to know if Katherine Lane was able to find out anything regarding a procedure other than surgery for hemorroids. Please advise.

## 2017-01-23 ENCOUNTER — Other Ambulatory Visit: Payer: Self-pay | Admitting: Nurse Practitioner

## 2017-01-23 NOTE — Telephone Encounter (Signed)
Pt has called to check on this.

## 2017-02-03 ENCOUNTER — Encounter (HOSPITAL_COMMUNITY): Payer: Self-pay | Admitting: Emergency Medicine

## 2017-02-03 ENCOUNTER — Emergency Department (HOSPITAL_COMMUNITY)
Admission: EM | Admit: 2017-02-03 | Discharge: 2017-02-03 | Disposition: A | Discharge status: Home / Self Care | Payer: Medicare Other | Attending: Emergency Medicine | Admitting: Emergency Medicine

## 2017-02-03 ENCOUNTER — Other Ambulatory Visit: Payer: Self-pay

## 2017-02-03 DIAGNOSIS — I110 Hypertensive heart disease with heart failure: Secondary | ICD-10-CM | POA: Diagnosis present

## 2017-02-03 DIAGNOSIS — Z885 Allergy status to narcotic agent status: Secondary | ICD-10-CM | POA: Insufficient documentation

## 2017-02-03 DIAGNOSIS — Z7901 Long term (current) use of anticoagulants: Secondary | ICD-10-CM | POA: Diagnosis not present

## 2017-02-03 DIAGNOSIS — Z79899 Other long term (current) drug therapy: Secondary | ICD-10-CM | POA: Diagnosis not present

## 2017-02-03 DIAGNOSIS — I509 Heart failure, unspecified: Secondary | ICD-10-CM | POA: Diagnosis not present

## 2017-02-03 DIAGNOSIS — I10 Essential (primary) hypertension: Secondary | ICD-10-CM

## 2017-02-03 MED ORDER — CLONIDINE HCL 0.1 MG PO TABS
0.1000 mg | ORAL_TABLET | Freq: Once | ORAL | Status: AC
  Administered 2017-02-03: 0.1 mg via ORAL
  Filled 2017-02-03: qty 1

## 2017-02-03 NOTE — ED Provider Notes (Signed)
[facility] DEPARTMENT Provider Note   [id number] [facility]: [facility]     History   Chief Complaint Chief Complaint  Patient presents with  . Hypertension    HPI Katherine Lane is a 83 y.o. female.  Patient presents with a concern of high blood pressure.  Pressure today was approximately 168/84 on several different readings.  It was slightly higher than this on initial vital signs (190/110).  No chest pain, dyspnea, neurological deficits.  She is presently on losartan 50 mg twice daily.  She took an additional dose today.  Additionally, she takes metoprolol ER 25 mg daily and verapamil ER 240 mg daily.  She has a primary care doctor and a cardiologist who make recommendations on her blood pressure management.  She has had long-standing hypertension.  Nothing makes symptoms better or worse.      Past Medical History:  Diagnosis Date  . A-fib (HCC)   . Anxiety   . Arthritis   . CHF (congestive heart failure) (HCC)   . Chronic back pain   . Constipation, chronic   . Depression   . Family history of adverse reaction to anesthesia    Mother, Sister, daughter- N/V  . History of blood transfusion    "pregnancy"  . HTN (hypertension)   . Insomnia   . Migraine headache   . Osteoporosis   . PONV (postoperative nausea and vomiting)    "violent"  . Pulmonary edema 06/2016  . Sodium (Na) deficiency   . Trigeminal neuralgia     Patient Active Problem List   Diagnosis Date Noted  . Lumbar compression fracture (HCC) 12/26/2016  . Gastroesophageal reflux disease without esophagitis 07/18/2016  . Acute on chronic diastolic CHF (congestive heart failure) (HCC) 07/11/2016  . Chronic coughing 07/11/2016  . Pulmonary edema 07/10/2016  . Migraine headache 07/01/2016  . Blurred vision, bilateral 07/01/2016  . Anxiety 06/30/2016  . Altered mental status 12/06/2015  . Hyponatremia 12/06/2015  . Hypokalemia 12/06/2015  . Dehydration 12/06/2015  .  Prolonged Q-T interval on ECG 12/06/2015  . Postural dizziness with presyncope 12/06/2015  . Atrial fibrillation (HCC) 07/14/2013  . Atrial flutter (HCC) 05/12/2013  . Bulging discs 04/20/2013  . Trigeminal neuralgia 01/15/2013  . Cellulitis 07/31/2011  . Mechanical complication of internal orthopedic implant (HCC) 07/21/2011  . 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 Ankle fracture 07/02/2010  . LESION OF PLANTAR NERVE 05/14/2009  . CLOSED BIMALLEOLAR FRACTURE 03/01/2009  . Essential hypertension 11/07/2008  . CONSTIPATION, CHRONIC 11/07/2008  . HEMATOCHEZIA 11/07/2008  . Osteoporosis 11/07/2008    Past Surgical History:  Procedure Laterality Date  . ANKLE FRACTURE SURGERY Right   . CARDIAC CATHETERIZATION     denies  . CATARACT EXTRACTION W/PHACO Right 07/22/2012   Procedure: CATARACT EXTRACTION PHACO AND INTRAOCULAR LENS PLACEMENT (IOC);  Surgeon: Gemma PayorKerry Hunt, MD;  Location: [facility];  Service: Ophthalmology;  Laterality: Right;  CDE:  18.93  . CATARACT EXTRACTION W/PHACO Left 08/09/2012   Procedure: CATARACT EXTRACTION PHACO AND INTRAOCULAR LENS PLACEMENT (IOC);  Surgeon: Gemma PayorKerry Hunt, MD;  Location: [facility];  Service: Ophthalmology;  Laterality: Left;  CDE: 17.21  . COLONOSCOPY    . HARDWARE REMOVAL  07/18/2011   Procedure: HARDWARE REMOVAL; Right leg-  Surgeon: Vickki HearingStanley E Harrison, MD;  Location: [facility];  Service: Orthopedics;  Laterality: Right;  . KYPHOPLASTY N/A 12/26/2016   Procedure: Lumbar two Lumbar three and Lumbar five KYPHOPLASTY;  Surgeon: Maeola HarmanStern, Joseph, MD;  Location:  [facility];  Service: Neurosurgery;  Laterality: N/A;  . NERVE REPAIR  07/18/2011   Procedure: NERVE REPAIR;  Surgeon: Vickki Hearing, MD;  Location: [facility];  Service: Orthopedics;  Laterality: Right;  Right leg superficial peroneal nerve release    . PARTIAL HYSTERECTOMY      OB History    Gravida Para Term Preterm AB Living   3 3 3     3    SAB TAB Ectopic Multiple Live Births           3        Home Medications    Prior to Admission medications   Medication Sig Start Date End Date Taking? Authorizing Provider  acetaminophen (TYLENOL) 500 MG tablet Take 1,000 mg 2 (two) times daily as needed by mouth for moderate pain.    [provider]  ANALPRAM-HC 1-1 % rectal cream APPLY TO AFFECTED AREAS TWICE DAILY. Patient taking differently: APPLY TO AFFECTED AREAS TWICE DAILY AS NEEDED FOR HEMORRHOIDS 07/01/16   Adline Potter, NP  Carboxymethylcellul-Glycerin (LUBRICATING EYE DROPS OP) Apply 1 drop 3 (three) times daily to eye.    [provider]  furosemide (LASIX) 20 MG tablet Take 1 tablet (20 mg total) by mouth daily as needed for fluid or edema. For weight gain of 3 Lbs in 1 day or 5 Lbs in 2 days. 09/02/16 09/02/17  Laqueta Linden, MD  hydrocortisone-pramoxine [facility]) 2.5-1 % rectal cream APPLY TO HEMORRHOIDS 4 TIMES DAILY AS NEEDED. 01/23/17   Babs Sciara, MD  LORazepam (ATIVAN) 0.5 MG tablet TAKE ONE TABLET DAILY AS NEEDED. Patient taking differently: Take 0.5 mg by mouth once daily in the evening 08/26/16   Merlyn Albert, MD  losartan (COZAAR) 50 MG tablet Take 1 tablet (50 mg total) 2 (two) times daily by mouth. 01/05/17 04/05/17  Laqueta Linden, MD  metoprolol succinate (TOPROL-XL) 25 MG 24 hr tablet Take 25 mg by mouth in the morning Patient taking differently: Take 25 mg daily with lunch by mouth.  09/02/16   Laqueta Linden, MD  polyethylene glycol powder (MIRALAX) powder Take 17 g daily as needed by mouth for moderate constipation.     [provider]  potassium chloride SA (K-DUR,KLOR-CON) 20 MEQ tablet Take 0.5 tablets (10 mEq total) by mouth 2 (two) times daily. Patient taking differently: Take 20 mEq daily as needed by mouth (swelling). Take with lasix 09/02/16   Laqueta Linden, MD  Pramoxine-HC (HYDROCORTISONE ACE-PRAMOXINE) 2.5-1 % CREA Apply to hemorrhoids QID prn 01/07/17   Campbell Riches, NP   rivaroxaban (XARELTO) 20 MG TABS tablet TAKE 1 TABLET DAILY WITH SUPPER. 09/02/16   Laqueta Linden, MD  verapamil (CALAN-SR) 240 MG CR tablet Take 240 mg by mouth in the morning 09/02/16   Laqueta Linden, MD  zolpidem (AMBIEN) 10 MG tablet TAKE 1 TABLET BY MOUTH AT BEDTIME FOR SLEEP. 12/10/16   Merlyn Albert, MD    Family History Family History  Problem Relation Age of Onset  . Hypertension Mother   . Alzheimer's disease Mother   . Cancer Mother   . Hypertension Father   . Other Father        abused pain meds  . Diabetes Brother   . Other Brother        back problems  . Diabetes Daughter   . Anesthesia problems Neg Hx   . Hypotension Neg Hx   . Malignant hyperthermia Neg Hx   .  Pseudochol deficiency Neg Hx     Social History Social History   Tobacco Use  . Smoking status: Never Smoker  . Smokeless tobacco: Never Used  Substance Use Topics  . Alcohol use: No    Alcohol/week: 0.0 oz  . Drug use: No     Allergies   Codeine; Other; and Tramadol   Review of Systems Review of Systems  All other systems reviewed and are negative.    Physical Exam Updated Vital Signs BP (!) 154/84   Pulse (!) 56   Temp 98.1 F (36.7 C) (Oral)   Resp 15   Ht 5\' 3"  (1.6 m)   Wt 62.1 kg (137 lb)   SpO2 100%   BMI 24.27 kg/m   Physical Exam  Constitutional: She is oriented to person, place, and time. She appears well-developed and well-nourished.  HENT:  Head: Normocephalic and atraumatic.  Eyes: Conjunctivae are normal.  Neck: Neck supple.  Cardiovascular: Normal rate and regular rhythm.  Pulmonary/Chest: Effort normal and breath sounds normal.  Abdominal: Soft. Bowel sounds are normal.  Musculoskeletal: Normal range of motion.  Neurological: She is alert and oriented to person, place, and time.  Skin: Skin is warm and dry.  Psychiatric: She has a normal mood and affect. Her behavior is normal.  Nursing note and vitals reviewed.    ED Treatments /  Results  Labs (all labs ordered are listed, but only abnormal results are displayed) Labs Reviewed - No data to display  EKG  EKG Interpretation None       Radiology No results found.  Procedures Procedures (including critical care time)  Medications Ordered in ED Medications  cloNIDine (CATAPRES) tablet 0.1 mg (0.1 mg Oral Given 02/03/17 2003)     Initial Impression / Assessment and Plan / ED Course  I have reviewed the triage vital signs and the nursing notes.  Pertinent labs & imaging results that were available during my care of the patient were reviewed by me and considered in my medical decision making (see chart for details).     Patient has a normal physical exam.  Clonidine 0.1 mg was administered for her blood pressure.  She will see her primary care physician tomorrow for further recommendations.  Final Clinical Impressions(s) / ED Diagnoses   Final diagnoses:  Hypertension, unspecified type    ED Discharge Orders    None       Donnetta Hutchingook, Kayly Kriegel, MD 02/03/17 2125

## 2017-02-03 NOTE — ED Triage Notes (Signed)
Pt C/O of increased BP since her BP medications were changed 3 weeks ago. Pt states she has been treated for "extremely high BP for a long time."

## 2017-02-03 NOTE — Discharge Instructions (Signed)
Follow-up with your primary care doctor tomorrow for blood pressure recommendations.  Mrs Katherine Lane was given clonidine 0.1 mg this evening

## 2017-02-04 ENCOUNTER — Encounter: Payer: Self-pay | Admitting: Family Medicine

## 2017-02-04 ENCOUNTER — Ambulatory Visit (HOSPITAL_COMMUNITY)
Admission: RE | Admit: 2017-02-04 | Discharge: 2017-02-04 | Disposition: A | Discharge status: Home / Self Care | Payer: Medicare Other | Source: Ambulatory Visit | Attending: Family Medicine | Admitting: Family Medicine

## 2017-02-04 ENCOUNTER — Ambulatory Visit: Payer: Medicare Other | Admitting: Family Medicine

## 2017-02-04 VITALS — BP 168/82 | Ht 61.5 in | Wt 139.0 lb

## 2017-02-04 DIAGNOSIS — M545 Low back pain, unspecified: Secondary | ICD-10-CM

## 2017-02-04 DIAGNOSIS — I10 Essential (primary) hypertension: Secondary | ICD-10-CM

## 2017-02-04 DIAGNOSIS — M8588 Other specified disorders of bone density and structure, other site: Secondary | ICD-10-CM | POA: Diagnosis not present

## 2017-02-04 DIAGNOSIS — M4856XA Collapsed vertebra, not elsewhere classified, lumbar region, initial encounter for fracture: Secondary | ICD-10-CM | POA: Insufficient documentation

## 2017-02-04 DIAGNOSIS — S32010B Wedge compression fracture of first lumbar vertebra, initial encounter for open fracture: Secondary | ICD-10-CM

## 2017-02-04 MED ORDER — AMLODIPINE BESYLATE 5 MG PO TABS: 5.0000 mg | ORAL_TABLET | Freq: Every day | ORAL | 11 refills | Status: DC

## 2017-02-04 NOTE — Patient Instructions (Addendum)
Start amlodipine 5 mg daily-stop verapamil Call us readings on Friday Recheck next week Do your xrays Continue losartan 50 mg one twice a day Bring your blood pressure cuff when you come     DASH Eating Plan DASH stands for "Dietary Approaches to Stop Hypertension." The DASH eating plan is a healthy eating plan that has been shown to reduce high blood pressure (hypertension). It may also reduce your risk for type 2 diabetes, heart disease, and stroke. The DASH eating plan may also help with weight loss. What are tips for following this plan? General guidelines  Avoid eating more than 2,300 mg (milligrams) of salt (sodium) a day. If you have hypertension, you may need to reduce your sodium intake to 1,500 mg a day.  Limit alcohol intake to no more than 1 drink a day for nonpregnant women and 2 drinks a day for men. One drink equals 12 oz of beer, 5 oz of wine, or 1 oz of hard liquor.  Work with your health care provider to maintain a healthy body weight or to lose weight. Ask what an ideal weight is for you.  Get at least 30 minutes of exercise that causes your heart to beat faster (aerobic exercise) most days of the week. Activities may include walking, swimming, or biking.  Work with your health care provider or diet and nutrition specialist (dietitian) to adjust your eating plan to your individual calorie needs. Reading food labels  Check food labels for the amount of sodium per serving. Choose foods with less than 5 percent of the Daily Value of sodium. Generally, foods with less than 300 mg of sodium per serving fit into this eating plan.  To find whole grains, look for the word "whole" as the first word in the ingredient list. Shopping  Buy products labeled as "low-sodium" or "no salt added."  Buy fresh foods. Avoid canned foods and premade or frozen meals. Cooking  Avoid adding salt when cooking. Use salt-free seasonings or herbs instead of table salt or sea salt. Check  with your health care provider or pharmacist before using salt substitutes.  Do not fry foods. Cook foods using healthy methods such as baking, boiling, grilling, and broiling instead.  Cook with heart-healthy oils, such as olive, canola, soybean, or sunflower oil. Meal planning   Eat a balanced diet that includes: ? 5 or more servings of fruits and vegetables each day. At each meal, try to fill half of your plate with fruits and vegetables. ? Up to 6-8 servings of whole grains each day. ? Less than 6 oz of lean meat, poultry, or fish each day. A 3-oz serving of meat is about the same size as a deck of cards. One egg equals 1 oz. ? 2 servings of low-fat dairy each day. ? A serving of nuts, seeds, or beans 5 times each week. ? Heart-healthy fats. Healthy fats called Omega-3 fatty acids are found in foods such as flaxseeds and coldwater fish, like sardines, salmon, and mackerel.  Limit how much you eat of the following: ? Canned or prepackaged foods. ? Food that is high in trans fat, such as fried foods. ? Food that is high in saturated fat, such as fatty meat. ? Sweets, desserts, sugary drinks, and other foods with added sugar. ? Full-fat dairy products.  Do not salt foods before eating.  Try to eat at least 2 vegetarian meals each week.  Eat more home-cooked food and less restaurant, buffet, and fast food.  When  eating at a restaurant, ask that your food be prepared with less salt or no salt, if possible. What foods are recommended? The items listed may not be a complete list. Talk with your dietitian about what dietary choices are best for you. Grains Whole-grain or whole-wheat bread. Whole-grain or whole-wheat pasta. Brown rice. [place]. Bulgur. Whole-grain and low-sodium cereals. Pita bread. Low-fat, low-sodium crackers. Whole-wheat flour tortillas. Vegetables Fresh or frozen vegetables (raw, steamed, roasted, or grilled). Low-sodium or reduced-sodium tomato and  vegetable juice. Low-sodium or reduced-sodium tomato sauce and tomato paste. Low-sodium or reduced-sodium canned vegetables. Fruits All fresh, dried, or frozen fruit. Canned fruit in natural juice (without added sugar). Meat and other protein foods Skinless chicken or [place]. Ground chicken or [place]. Pork with fat trimmed off. Fish and seafood. Egg whites. Dried beans, peas, or lentils. Unsalted nuts, nut butters, and seeds. Unsalted canned beans. Lean cuts of beef with fat trimmed off. Low-sodium, lean deli meat. Dairy Low-fat (1%) or fat-free (skim) milk. Fat-free, low-fat, or reduced-fat cheeses. Nonfat, low-sodium ricotta or cottage cheese. Low-fat or nonfat yogurt. Low-fat, low-sodium cheese. Fats and oils Soft margarine without trans fats. Vegetable oil. Low-fat, reduced-fat, or light mayonnaise and salad dressings (reduced-sodium). Canola, safflower, olive, soybean, and sunflower oils. Avocado. Seasoning and other foods Herbs. Spices. Seasoning mixes without salt. Unsalted popcorn and pretzels. Fat-free sweets. What foods are not recommended? The items listed may not be a complete list. Talk with your dietitian about what dietary choices are best for you. Grains Baked goods made with fat, such as croissants, muffins, or some breads. Dry pasta or rice meal packs. Vegetables Creamed or fried vegetables. Vegetables in a cheese sauce. Regular canned vegetables (not low-sodium or reduced-sodium). Regular canned tomato sauce and paste (not low-sodium or reduced-sodium). Regular tomato and vegetable juice (not low-sodium or reduced-sodium). Angie Fava. Olives. Fruits Canned fruit in a light or heavy syrup. Fried fruit. Fruit in cream or butter sauce. Meat and other protein foods Fatty cuts of meat. Ribs. Fried meat. Berniece Salines. Sausage. Bologna and other processed lunch meats. Salami. Fatback. Hotdogs. Bratwurst. Salted nuts and seeds. Canned beans with added salt. Canned or smoked fish. Whole eggs or  egg yolks. Chicken or [place] with skin. Dairy Whole or 2% milk, cream, and half-and-half. Whole or full-fat cream cheese. Whole-fat or sweetened yogurt. Full-fat cheese. Nondairy creamers. Whipped toppings. Processed cheese and cheese spreads. Fats and oils Butter. Stick margarine. Lard. Shortening. Ghee. Bacon fat. Tropical oils, such as coconut, palm kernel, or palm oil. Seasoning and other foods Salted popcorn and pretzels. Onion salt, garlic salt, seasoned salt, table salt, and sea salt. Worcestershire sauce. Tartar sauce. Barbecue sauce. Teriyaki sauce. Soy sauce, including reduced-sodium. Steak sauce. Canned and packaged gravies. Fish sauce. Oyster sauce. Cocktail sauce. Horseradish that you find on the shelf. Ketchup. Mustard. Meat flavorings and tenderizers. Bouillon cubes. Hot sauce and Tabasco sauce. Premade or packaged marinades. Premade or packaged taco seasonings. Relishes. Regular salad dressings. Where to find more information:  National Heart, Lung, and [place]: https://wilson-eaton.com/  American Heart Association: www.heart.org Summary  The DASH eating plan is a healthy eating plan that has been shown to reduce high blood pressure (hypertension). It may also reduce your risk for type 2 diabetes, heart disease, and stroke.  With the DASH eating plan, you should limit salt (sodium) intake to 2,300 mg a day. If you have hypertension, you may need to reduce your sodium intake to 1,500 mg a day.  When on the DASH eating plan, aim  to eat more fresh fruits and vegetables, whole grains, lean proteins, low-fat dairy, and heart-healthy fats.  Work with your health care provider or diet and nutrition specialist (dietitian) to adjust your eating plan to your individual calorie needs. This information is not intended to replace advice given to you by your health care provider. Make sure you discuss any questions you have with your health care provider. Document Released: 01/23/2011  Document Revised: 01/28/2016 Document Reviewed: 01/28/2016 Elsevier Interactive Patient Education  [employer].

## 2017-02-04 NOTE — Progress Notes (Signed)
   Subjective:    Patient ID: Katherine Lane, female    DOB: March 21, 1932, 83 y.o.   MRN: [id number]  HPI  Patient is here today for a hospital follow up for HTN.She states she is taking Losartan 50 one tablet BID,toprol xl 25 mg one daily,Verapamil 240 one daily. She states she was given medication to bring bp down (Clondine) she thinks ,took 45 mins and sent her home. Can not do orthostatics as her back will not let her.  She admits to taking losartan 3 times a day for the past 5-6 days without much improvement She has been on verapamil for a long period of time She denies chest pressure tightness or soreness or shortness of breath She does relate Significant lower back pain and discomfort that started last night after going to the ER she has a history of lumbar fracture and kyphoplasty Review of Systems  Constitutional: Negative for activity change, fatigue and fever.  HENT: Negative for congestion.   Respiratory: Negative for cough, chest tightness and shortness of breath.   Cardiovascular: Negative for chest pain and leg swelling.  Gastrointestinal: Negative for abdominal pain.  Skin: Negative for color change.  Neurological: Negative for headaches.  Psychiatric/Behavioral: Negative for behavioral problems.       Objective:   Physical Exam  Constitutional: She appears well-developed and well-nourished. No distress.  HENT:  Head: Normocephalic and atraumatic.  Eyes: Right eye exhibits no discharge. Left eye exhibits no discharge.  Neck: No tracheal deviation present.  Cardiovascular: Normal rate, regular rhythm and normal heart sounds.  No murmur heard. Pulmonary/Chest: Effort normal and breath sounds normal. No respiratory distress. She has no wheezes. She has no rales.  Musculoskeletal: She exhibits tenderness. She exhibits no edema.  Lymphadenopathy:    She has no cervical adenopathy.  Neurological: She is alert. She exhibits normal muscle tone.  Skin: Skin is warm and  dry. No erythema.  Psychiatric: Her behavior is normal.  Vitals reviewed.  Subjective lumbar pain does not radiate down the leg has history of lumbar fracture with kyphoplasty       Assessment & Plan:  Elevated systolic blood pressure-patient on metoprolol which should help control heart rhythm.  I would recommend stopping verapamil and instead use amlodipine 5 mg daily to see if this will help bring systolic pressure down further.  Patient will call us back with blood pressure readings in a few days may need to use 10 mg.  Continue metoprolol.  Continue losartan.  Patient advised maximum dose losartan 50 mg twice daily  Patient should follow-up next week bring her blood pressure cuff ideally trying to get the top number into the 140s or less patient will notify us if any problems she will follow-up with cardiology on a regular basis as well

## 2017-02-05 NOTE — Addendum Note (Signed)
Addended by: Meredith LeedsSUTTON, Chenoah Mcnally L on: 02/05/2017 10:21 AM   Modules accepted: Orders

## 2017-02-06 ENCOUNTER — Telehealth: Payer: Self-pay | Admitting: Family Medicine

## 2017-02-06 NOTE — Telephone Encounter (Signed)
Patient advised Dr Brett CanalesSteve would recommend staying at same dose and follow up next week as scheduled. Patient verbalized understanding.

## 2017-02-06 NOTE — Telephone Encounter (Signed)
Left message to return call 

## 2017-02-06 NOTE — Telephone Encounter (Signed)
Tell pt I would rec staying at same dose and f u next wk as scheduled

## 2017-02-06 NOTE — Telephone Encounter (Signed)
Patient was seen 12/19 and was told to send in her blood pressure readings for the next two days. 12/20-151/82 and this morning 174/92 am

## 2017-02-11 ENCOUNTER — Emergency Department (HOSPITAL_COMMUNITY)
Admission: EM | Admit: 2017-02-11 | Discharge: 2017-02-11 | Disposition: A | Discharge status: Home / Self Care | Payer: Medicare Other | Attending: Emergency Medicine | Admitting: Emergency Medicine

## 2017-02-11 ENCOUNTER — Encounter (HOSPITAL_COMMUNITY): Payer: Self-pay

## 2017-02-11 DIAGNOSIS — I5032 Chronic diastolic (congestive) heart failure: Secondary | ICD-10-CM | POA: Diagnosis not present

## 2017-02-11 DIAGNOSIS — R21 Rash and other nonspecific skin eruption: Secondary | ICD-10-CM | POA: Diagnosis not present

## 2017-02-11 DIAGNOSIS — Z79899 Other long term (current) drug therapy: Secondary | ICD-10-CM | POA: Insufficient documentation

## 2017-02-11 DIAGNOSIS — I110 Hypertensive heart disease with heart failure: Secondary | ICD-10-CM | POA: Diagnosis not present

## 2017-02-11 DIAGNOSIS — I10 Essential (primary) hypertension: Secondary | ICD-10-CM

## 2017-02-11 NOTE — ED Triage Notes (Signed)
My blood pressure has been up for a while.  Her blood pressure today was 205/118.  She was here last week due to her blood pressure being up and they got it down.  They changed her medications around at her MD office and is still having problems with it.  She has taken another blood pressure pill right before coming here and now it is 194/100.

## 2017-02-11 NOTE — Discharge Instructions (Signed)
Follow-up your primary care doctor tomorrow.  If rash worsens, let your doctor check it

## 2017-02-11 NOTE — ED Provider Notes (Signed)
[facility] EMERGENCY DEPARTMENT Provider Note   CSN: [id number] Arrival date & time: 02/11/17  1853     History   Chief Complaint Chief Complaint  Patient presents with  . Hypertension    HPI Katherine Lane is a 83 y.o. female.  Patient has ongoing hypertension.  She has been seen in the emergency department and by her primary care physician recently for blood pressure management issues.  Today her pressure was 205/118.  No chest pain, dyspnea, neuro deficits.  She is also concerned about a red area on her lower back.      Past Medical History:  Diagnosis Date  . A-fib (HCC)   . Anxiety   . Arthritis   . CHF (congestive heart failure) (HCC)   . Chronic back pain   . Constipation, chronic   . Depression   . Family history of adverse reaction to anesthesia    Mother, Sister, daughter- N/V  . History of blood transfusion    "pregnancy"  . HTN (hypertension)   . Insomnia   . Migraine headache   . Osteoporosis   . PONV (postoperative nausea and vomiting)    "violent"  . Pulmonary edema 06/2016  . Sodium (Na) deficiency   . Trigeminal neuralgia     Patient Active Problem List   Diagnosis Date Noted  . Lumbar compression fracture (HCC) 12/26/2016  . Gastroesophageal reflux disease without esophagitis 07/18/2016  . Acute on chronic diastolic CHF (congestive heart failure) (HCC) 07/11/2016  . Chronic coughing 07/11/2016  . Pulmonary edema 07/10/2016  . Migraine headache 07/01/2016  . Blurred vision, bilateral 07/01/2016  . Anxiety 06/30/2016  . Altered mental status 12/06/2015  . Hyponatremia 12/06/2015  . Hypokalemia 12/06/2015  . Dehydration 12/06/2015  . Prolonged Q-T interval on ECG 12/06/2015  . Postural dizziness with presyncope 12/06/2015  . Atrial fibrillation (HCC) 07/14/2013  . Atrial flutter (HCC) 05/12/2013  . Bulging discs 04/20/2013  . Trigeminal neuralgia 01/15/2013  . Cellulitis 07/31/2011  . Mechanical complication of internal orthopedic  implant (HCC) 07/21/2011  . 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 Ankle fracture 07/02/2010  . LESION OF PLANTAR NERVE 05/14/2009  . CLOSED BIMALLEOLAR FRACTURE 03/01/2009  . Essential hypertension 11/07/2008  . CONSTIPATION, CHRONIC 11/07/2008  . HEMATOCHEZIA 11/07/2008  . Osteoporosis 11/07/2008    Past Surgical History:  Procedure Laterality Date  . ANKLE FRACTURE SURGERY Right   . CARDIAC CATHETERIZATION     denies  . CATARACT EXTRACTION W/PHACO Right 07/22/2012   Procedure: CATARACT EXTRACTION PHACO AND INTRAOCULAR LENS PLACEMENT (IOC);  Surgeon: Gemma PayorKerry Hunt, MD;  Location: [facility];  Service: Ophthalmology;  Laterality: Right;  CDE:  18.93  . CATARACT EXTRACTION W/PHACO Left 08/09/2012   Procedure: CATARACT EXTRACTION PHACO AND INTRAOCULAR LENS PLACEMENT (IOC);  Surgeon: Gemma PayorKerry Hunt, MD;  Location: [facility];  Service: Ophthalmology;  Laterality: Left;  CDE: 17.21  . COLONOSCOPY    . HARDWARE REMOVAL  07/18/2011   Procedure: HARDWARE REMOVAL; Right leg-  Surgeon: Vickki HearingStanley E Harrison, MD;  Location: [facility];  Service: Orthopedics;  Laterality: Right;  . KYPHOPLASTY N/A 12/26/2016   Procedure: Lumbar two Lumbar three and Lumbar five KYPHOPLASTY;  Surgeon: Maeola HarmanStern, Joseph, MD;  [facility];  Service: Neurosurgery;  Laterality: N/A;  . NERVE REPAIR  07/18/2011   Procedure: NERVE REPAIR;  Surgeon: Vickki HearingStanley E Harrison, MD;  Location: [facility];  Service: Orthopedics;  Laterality: Right;  Right leg superficial peroneal nerve release    . PARTIAL HYSTERECTOMY  OB History    Gravida Para Term Preterm AB Living   3 3 3     3    SAB TAB Ectopic Multiple Live Births           3       Home Medications    Prior to Admission medications   Medication Sig Start Date End Date Taking? Authorizing Provider  acetaminophen (TYLENOL) 500 MG tablet Take 1,000 mg 2 (two) times daily as needed by mouth for moderate pain.    [provider]  amLODipine (NORVASC) 5 MG tablet Take 1  tablet (5 mg total) by mouth daily. 02/04/17   Babs Sciara, MD  ANALPRAM-HC 1-1 % rectal cream APPLY TO AFFECTED AREAS TWICE DAILY. Patient taking differently: APPLY TO AFFECTED AREAS TWICE DAILY AS NEEDED FOR HEMORRHOIDS 07/01/16   Adline Potter, NP  Carboxymethylcellul-Glycerin (LUBRICATING EYE DROPS OP) Apply 1 drop 3 (three) times daily to eye.    [provider]  furosemide (LASIX) 20 MG tablet Take 1 tablet (20 mg total) by mouth daily as needed for fluid or edema. For weight gain of 3 Lbs in 1 day or 5 Lbs in 2 days. 09/02/16 09/02/17  Laqueta Linden, MD  hydrocortisone-pramoxine [facility]) 2.5-1 % rectal cream APPLY TO HEMORRHOIDS 4 TIMES DAILY AS NEEDED. 01/23/17   Babs Sciara, MD  LORazepam (ATIVAN) 0.5 MG tablet TAKE ONE TABLET DAILY AS NEEDED. Patient taking differently: Take 0.5 mg by mouth once daily in the evening 08/26/16   Merlyn Albert, MD  losartan (COZAAR) 50 MG tablet Take 1 tablet (50 mg total) 2 (two) times daily by mouth. 01/05/17 04/05/17  Laqueta Linden, MD  metoprolol succinate (TOPROL-XL) 25 MG 24 hr tablet Take 25 mg by mouth in the morning Patient taking differently: Take 25 mg daily with lunch by mouth.  09/02/16   Laqueta Linden, MD  polyethylene glycol powder (MIRALAX) powder Take 17 g daily as needed by mouth for moderate constipation.     [provider]  potassium chloride SA (K-DUR,KLOR-CON) 20 MEQ tablet Take 0.5 tablets (10 mEq total) by mouth 2 (two) times daily. Patient taking differently: Take 20 mEq daily as needed by mouth (swelling). Take with lasix 09/02/16   Laqueta Linden, MD  Pramoxine-HC (HYDROCORTISONE ACE-PRAMOXINE) 2.5-1 % CREA Apply to hemorrhoids QID prn 01/07/17   Campbell Riches, NP  rivaroxaban (XARELTO) 20 MG TABS tablet TAKE 1 TABLET DAILY WITH SUPPER. 09/02/16   Laqueta Linden, MD  zolpidem (AMBIEN) 10 MG tablet TAKE 1 TABLET BY MOUTH AT BEDTIME FOR SLEEP. 12/10/16   Merlyn Albert, MD    Family History Family History  Problem Relation Age of Onset  . Hypertension Mother   . Alzheimer's disease Mother   . Cancer Mother   . Hypertension Father   . Other Father        abused pain meds  . Diabetes Brother   . Other Brother        back problems  . Diabetes Daughter   . Anesthesia problems Neg Hx   . Hypotension Neg Hx   . Malignant hyperthermia Neg Hx   . Pseudochol deficiency Neg Hx     Social History Social History   Tobacco Use  . Smoking status: Never Smoker  . Smokeless tobacco: Never Used  Substance Use Topics  . Alcohol use: No    Alcohol/week: 0.0 oz  . Drug use: No     Allergies  Codeine; Other; and Tramadol   Review of Systems Review of Systems  All other systems reviewed and are negative.    Physical Exam Updated Vital Signs BP (!) 163/84   Pulse 95   Temp 99.1 F (37.3 C) (Oral)   Resp 18   SpO2 96%   Physical Exam  Constitutional: She is oriented to person, place, and time. She appears well-developed and well-nourished.  Elevated blood pressure  HENT:  Head: Normocephalic and atraumatic.  Eyes: Conjunctivae are normal.  Neck: Neck supple.  Cardiovascular: Normal rate and regular rhythm.  Pulmonary/Chest: Effort normal and breath sounds normal.  Abdominal: Soft. Bowel sounds are normal.  Musculoskeletal: Normal range of motion.  Neurological: She is alert and oriented to person, place, and time.  Skin:  Area of erythema approximately 2.5 cm in diameter on her left lower back.  No evidence of shingles.  Psychiatric: She has a normal mood and affect. Her behavior is normal.  Nursing note and vitals reviewed.    ED Treatments / Results  Labs (all labs ordered are listed, but only abnormal results are displayed) Labs Reviewed - No data to display  EKG  EKG Interpretation None       Radiology No results found.  Procedures Procedures (including critical care time)  Medications Ordered in  ED Medications - No data to display   Initial Impression / Assessment and Plan / ED Course  I have reviewed the triage vital signs and the nursing notes.  Pertinent labs & imaging results that were available during my care of the patient were reviewed by me and considered in my medical decision making (see chart for details).    Patient presents with persistent hypertension.  She has a normal physical exam.  Her pressure has lessened.  The rash on her back does not appear to be shingles.  She will follow-up with her primary care doctor tomorrow   Final Clinical Impressions(s) / ED Diagnoses   Final diagnoses:  Hypertension, unspecified type  Rash    ED Discharge Orders    None       Donnetta Hutchingook, Idabelle Mcpeters, MD 02/11/17 2045

## 2017-02-12 ENCOUNTER — Encounter: Payer: Self-pay | Admitting: Family Medicine

## 2017-02-12 ENCOUNTER — Ambulatory Visit: Payer: Medicare Other | Admitting: Family Medicine

## 2017-02-12 VITALS — BP 163/92 | Ht 61.5 in | Wt 137.0 lb

## 2017-02-12 DIAGNOSIS — I10 Essential (primary) hypertension: Secondary | ICD-10-CM | POA: Diagnosis not present

## 2017-02-12 DIAGNOSIS — M8000XA Age-related osteoporosis with current pathological fracture, unspecified site, initial encounter for fracture: Secondary | ICD-10-CM | POA: Diagnosis not present

## 2017-02-12 MED ORDER — ALENDRONATE SODIUM 70 MG PO TABS: 70.0000 mg | ORAL_TABLET | ORAL | 3 refills | Status: DC

## 2017-02-12 MED ORDER — AMLODIPINE BESYLATE 10 MG PO TABS: 10.0000 mg | ORAL_TABLET | Freq: Every day | ORAL | 1 refills | Status: DC

## 2017-02-12 MED ORDER — LORAZEPAM 0.5 MG PO TABS: ORAL_TABLET | ORAL | 5 refills | Status: DC

## 2017-02-12 NOTE — Progress Notes (Signed)
   Subjective:    Patient ID: Katherine Lane, female    DOB: 07-27-1932, 83 y.o.   MRN: [id number]  HPI Patient is here today for a follow up on Hospitalization from yesterday from HTN. She is currently taking metoprolol 25 mg one BID most days,Losartan 50 mg one Bid,Amlodipine 5 mg one daily.Per Dtr Katherine Lane BP is elevated Q night.Took her bp in office bp was 148/88 and 163/92  with her cuff.  Complete emergency room notes reviewed.  Complete hospital room notes reviewed.  Reviewed  Ongoing pain from recent compression fracture.  The past patient has declined to take Fosamax but she is now more motivated with an additional compression fracture.  Patient has been back to the emergency room once again with elevated blood pressure.  She brings in her machine today.   Face blood shot and neck get s blood  Review of Systems No headache, no major weight loss or weight gain, no chest pain no back pain abdominal pain no change in bowel habits complete ROS otherwise negative     Objective:   Physical Exam  Alert and oriented, vitals reviewed and stable, NAD ENT-TM's and ext canals WNL bilat via otoscopic exam Soft palate, tonsils and post pharynx WNL via oropharyngeal exam Neck-symmetric, no masses; thyroid nonpalpable and nontender Pulmonary-no tachypnea or accessory muscle use; Clear without wheezes via auscultation Card--no abnrml murmurs, rhythm reg and rate WNL Carotid pulses symmetric, without bruits Blood pressure on repeat 145/92 both arms  Patient's own machine generally on the high side with systolic often 12-15 points higher than our recorded values     Assessment & Plan:  Impression 1 hypertension.  Suboptimal control.  Complicated by #2 and #3.  We will go ahead and increase Norvasc.  Rationale discussed  2.  Recent additional compression fracture with additional pain  3.  Chronic anxiety.  Definitely plays into things with this patient.  4.  Osteoporosis.  Discussed with  patient willing to revisit Fosamax.  Minimally questions from patient and daughter and  Greater than 50% of this 40 minute face to face visit was spent in counseling and discussion and coordination of care regarding the above diagnosis/diagnosies

## 2017-02-16 ENCOUNTER — Other Ambulatory Visit: Payer: Self-pay | Admitting: Neurosurgery

## 2017-02-18 ENCOUNTER — Encounter (HOSPITAL_COMMUNITY): Payer: Self-pay | Admitting: *Deleted

## 2017-02-18 ENCOUNTER — Other Ambulatory Visit: Payer: Self-pay

## 2017-02-18 NOTE — Progress Notes (Signed)
Spoke with pt for pre-op call. Pt has hx of A-fib and CHF. Pt is on Xarelto and has been instructed to stop it prior to surgery. Last dose was Sunday 02/15/17. Pt has been in the ED recently with her blood pressure being very high. She states she has not checked it recently because it upsets her to do so. She thinks the last one was around 170/90.  Dr. Noreene LarssonJoslin ok'ed pt having a light breakfast by 7 AM, clear liquids until 10 AM and then NPO. Pt given this information and voiced understanding.

## 2017-02-18 NOTE — Progress Notes (Signed)
Anesthesia Chart Review: SAME DAY WORK-UP.  Patient is a 84 year old female scheduled for L1 kyphoplasty on 02/19/2017 by Dr. Maeola HarmanJoseph Stern.  History includes never smoker, hypertension, atrial fibrillation, chronic diastolic CHF, trigeminal neuralgia, anxiety, depression, chronic back pain, migraines, insomnia, arthritis, osteoporosis, hyponatremia, postoperative N/V. She is s/p L2-3 and L5 kyphoplasties on 12/26/2016. - ED visit 02/11/17 with BP 205/118. F/U BP 163/84. No CP or SOB. She also had a 2.5 cm area of erythema on her back that did not appear consistent with Shingles. She was discharged home with PCP follow-up. (She was seen by her PCP Dr. Vilinda BlanksWilliam S. Luking on 02/12/17. BP readings 148/88, 163/92, and 145/92 (both arms). He increased her amlodipine. He also felt pain from compression fracture and her anxiety were contributing to her elevated BP readings.   Cardiologist is Dr. Prentice DockerSuresh Koneswaran, last visit 01/05/17. BP 180/80. Losartan was increased at that visit due to HTN. Afib and CHF stable at that time.   Meds include amlodipine, Lasix, hydromorphone, Ativan, losartan, Toprol-XL, Zofran, KCl PRN, Xarelto (holding for surgery), Ambien.   She reported homes BP readings ~ 170/90--although not checking frequently.   EKG 07/12/16: Afib at 92 bpm, anteroseptal infarct (age undetermined).  Echo 07/01/16: Study Conclusions - Left ventricle: The cavity size was normal. Wall thickness was   increased in a pattern of mild LVH. Systolic function was   vigorous. The estimated ejection fraction was in the range of 65%   to 70%. Wall motion was normal; there were no regional wall   motion abnormalities. The study was not technically sufficient to   allow evaluation of LV diastolic dysfunction due to atrial   fibrillation. - Aortic valve: There was mild regurgitation. - Mitral valve: There was mild regurgitation. - Tricuspid valve: There was mild regurgitation.  30-day event monitor 06/2013:  Sinus rhythm with sinus arrhythmia and sinus bradycardia noted, occasional PACs. Rate controlled atrial fibrillation also noted. No symptoms reported.  Cardiac cath 01/21/08: CONCLUSION: 1. Normal coronary angiography and left ventricular wall motion. 2. Normal pulmonary artery pressure and pulmonary artery wedge pressure.  CXR 07/11/16: IMPRESSION: Findings consistent with persistent congestive heart failure with bilateral mild interstitial edema and small bilateral pleural Effusions.  She will need updated labs and vitals on arrival. If BP is reasonable, afib rate controlled, labs acceptable, and otherwise no acute changes then I would anticipate that she can proceed as planned. I did leave a voice message for Shanda BumpsJessica at Dr. Fredrich BirksStern's office updating about recent ED and PCP visits for elevated BP.   Velna Ochsllison Rosaisela Jamroz, PA-C [facility]Anesthesiology Phone [phone] 02/18/2017 12:13 PM

## 2017-02-19 ENCOUNTER — Ambulatory Visit (HOSPITAL_COMMUNITY): Payer: Medicare Other | Admitting: Vascular Surgery

## 2017-02-19 ENCOUNTER — Other Ambulatory Visit: Payer: Self-pay

## 2017-02-19 ENCOUNTER — Ambulatory Visit (HOSPITAL_COMMUNITY)
Admission: RE | Admit: 2017-02-19 | Discharge: 2017-02-20 | Disposition: A | Discharge status: Home / Self Care | Payer: Medicare Other | Source: Ambulatory Visit | Attending: Neurosurgery | Admitting: Neurosurgery

## 2017-02-19 ENCOUNTER — Encounter (HOSPITAL_COMMUNITY): Payer: Self-pay | Admitting: Anesthesiology

## 2017-02-19 ENCOUNTER — Encounter (HOSPITAL_COMMUNITY)
Admission: RE | Disposition: A | Discharge status: Home / Self Care | Payer: Self-pay | Source: Ambulatory Visit | Attending: Neurosurgery

## 2017-02-19 ENCOUNTER — Inpatient Hospital Stay (HOSPITAL_COMMUNITY): Payer: Medicare Other

## 2017-02-19 DIAGNOSIS — R2689 Other abnormalities of gait and mobility: Secondary | ICD-10-CM | POA: Diagnosis not present

## 2017-02-19 DIAGNOSIS — Z79899 Other long term (current) drug therapy: Secondary | ICD-10-CM | POA: Insufficient documentation

## 2017-02-19 DIAGNOSIS — I110 Hypertensive heart disease with heart failure: Secondary | ICD-10-CM | POA: Diagnosis not present

## 2017-02-19 DIAGNOSIS — I509 Heart failure, unspecified: Secondary | ICD-10-CM | POA: Diagnosis not present

## 2017-02-19 DIAGNOSIS — Z419 Encounter for procedure for purposes other than remedying health state, unspecified: Secondary | ICD-10-CM

## 2017-02-19 DIAGNOSIS — K219 Gastro-esophageal reflux disease without esophagitis: Secondary | ICD-10-CM | POA: Insufficient documentation

## 2017-02-19 DIAGNOSIS — F329 Major depressive disorder, single episode, unspecified: Secondary | ICD-10-CM | POA: Diagnosis not present

## 2017-02-19 DIAGNOSIS — M4856XA Collapsed vertebra, not elsewhere classified, lumbar region, initial encounter for fracture: Secondary | ICD-10-CM | POA: Diagnosis not present

## 2017-02-19 DIAGNOSIS — F419 Anxiety disorder, unspecified: Secondary | ICD-10-CM | POA: Insufficient documentation

## 2017-02-19 DIAGNOSIS — I4891 Unspecified atrial fibrillation: Secondary | ICD-10-CM | POA: Insufficient documentation

## 2017-02-19 DIAGNOSIS — S32010A Wedge compression fracture of first lumbar vertebra, initial encounter for closed fracture: Secondary | ICD-10-CM | POA: Diagnosis present

## 2017-02-19 DIAGNOSIS — Z7902 Long term (current) use of antithrombotics/antiplatelets: Secondary | ICD-10-CM | POA: Diagnosis not present

## 2017-02-19 HISTORY — PX: KYPHOPLASTY: SHX5884

## 2017-02-19 HISTORY — DX: Constipation, unspecified: K59.00

## 2017-02-19 LAB — BASIC METABOLIC PANEL
Anion gap: 9 (ref 5–15)
BUN: 14 mg/dL (ref 6–20)
CO2: 27 mmol/L (ref 22–32)
Calcium: 9.6 mg/dL (ref 8.9–10.3)
Chloride: 104 mmol/L (ref 101–111)
Creatinine, Ser: 0.85 mg/dL (ref 0.44–1.00)
GFR calc Af Amer: >60 mL/min (ref >60)
GFR calc non Af Amer: >60 mL/min (ref >60)
Glucose, Bld: 99 mg/dL (ref 65–99)
Potassium: 3.7 mmol/L (ref 3.5–5.1)
Sodium: 140 mmol/L (ref 135–145)

## 2017-02-19 LAB — PROTIME-INR
INR: 1.05
Prothrombin Time: 13.6 seconds (ref 11.4–15.2)

## 2017-02-19 SURGERY — KYPHOPLASTY
Anesthesia: General

## 2017-02-19 MED ORDER — 0.9 % SODIUM CHLORIDE (POUR BTL) OPTIME
TOPICAL | Status: DC | PRN
  Administered 2017-02-19: 1000 mL

## 2017-02-19 MED ORDER — FENTANYL CITRATE (PF) 100 MCG/2ML IJ SOLN
INTRAMUSCULAR | Status: AC
  Filled 2017-02-19: qty 2

## 2017-02-19 MED ORDER — LORAZEPAM 0.5 MG PO TABS: 0.5000 mg | ORAL_TABLET | Freq: Every day | ORAL | Status: DC | PRN

## 2017-02-19 MED ORDER — LIDOCAINE-EPINEPHRINE 1 %-1:100000 IJ SOLN
INTRAMUSCULAR | Status: AC
  Filled 2017-02-19: qty 1

## 2017-02-19 MED ORDER — DOCUSATE SODIUM 100 MG PO CAPS
100.0000 mg | ORAL_CAPSULE | Freq: Two times a day (BID) | ORAL | Status: DC
  Administered 2017-02-19: 100 mg via ORAL
  Filled 2017-02-19: qty 1

## 2017-02-19 MED ORDER — FLEET ENEMA 7-19 GM/118ML RE ENEM: 1.0000 | ENEMA | Freq: Once | RECTAL | Status: DC | PRN

## 2017-02-19 MED ORDER — SODIUM CHLORIDE 0.9% FLUSH: 3.0000 mL | INTRAVENOUS | Status: DC | PRN

## 2017-02-19 MED ORDER — PROPOFOL 10 MG/ML IV BOLUS
INTRAVENOUS | Status: DC | PRN
  Administered 2017-02-19: 110 mg via INTRAVENOUS

## 2017-02-19 MED ORDER — HYDROMORPHONE HCL 2 MG PO TABS
1.0000 mg | ORAL_TABLET | Freq: Four times a day (QID) | ORAL | Status: DC | PRN
  Administered 2017-02-19: 1 mg via ORAL
  Filled 2017-02-19: qty 1

## 2017-02-19 MED ORDER — SODIUM CHLORIDE 0.9% FLUSH
3.0000 mL | Freq: Two times a day (BID) | INTRAVENOUS | Status: DC
  Administered 2017-02-19: 3 mL via INTRAVENOUS

## 2017-02-19 MED ORDER — HYDROCORTISONE ACE-PRAMOXINE 2.5-1 % EX CREA: 1.0000 "application " | TOPICAL_CREAM | Freq: Four times a day (QID) | CUTANEOUS | Status: DC | PRN

## 2017-02-19 MED ORDER — CEFAZOLIN SODIUM-DEXTROSE 2-4 GM/100ML-% IV SOLN
INTRAVENOUS | Status: AC
  Filled 2017-02-19: qty 100

## 2017-02-19 MED ORDER — ROCURONIUM BROMIDE 100 MG/10ML IV SOLN
INTRAVENOUS | Status: DC | PRN
  Administered 2017-02-19: 40 mg via INTRAVENOUS

## 2017-02-19 MED ORDER — POLYETHYLENE GLYCOL 3350 17 G PO PACK: 17.0000 g | PACK | Freq: Every day | ORAL | Status: DC | PRN

## 2017-02-19 MED ORDER — ONDANSETRON HCL 4 MG/2ML IJ SOLN
INTRAMUSCULAR | Status: DC | PRN
  Administered 2017-02-19: 4 mg via INTRAVENOUS

## 2017-02-19 MED ORDER — MENTHOL 3 MG MT LOZG: 1.0000 | LOZENGE | OROMUCOSAL | Status: DC | PRN

## 2017-02-19 MED ORDER — ONDANSETRON HCL 4 MG PO TABS: 4.0000 mg | ORAL_TABLET | Freq: Four times a day (QID) | ORAL | Status: DC | PRN

## 2017-02-19 MED ORDER — DEXAMETHASONE SODIUM PHOSPHATE 10 MG/ML IJ SOLN
INTRAMUSCULAR | Status: DC | PRN
  Administered 2017-02-19: 10 mg via INTRAVENOUS

## 2017-02-19 MED ORDER — METHOCARBAMOL 500 MG PO TABS: 500.0000 mg | ORAL_TABLET | Freq: Four times a day (QID) | ORAL | Status: DC | PRN

## 2017-02-19 MED ORDER — CHLORHEXIDINE GLUCONATE CLOTH 2 % EX PADS: 6.0000 | MEDICATED_PAD | Freq: Once | CUTANEOUS | Status: DC

## 2017-02-19 MED ORDER — ZOLPIDEM TARTRATE 5 MG PO TABS: 5.0000 mg | ORAL_TABLET | Freq: Every evening | ORAL | Status: DC | PRN

## 2017-02-19 MED ORDER — BUPIVACAINE HCL (PF) 0.5 % IJ SOLN
INTRAMUSCULAR | Status: AC
  Filled 2017-02-19: qty 30

## 2017-02-19 MED ORDER — ONDANSETRON HCL 4 MG/2ML IJ SOLN
INTRAMUSCULAR | Status: AC
  Filled 2017-02-19: qty 2

## 2017-02-19 MED ORDER — LIDOCAINE 2% (20 MG/ML) 5 ML SYRINGE
INTRAMUSCULAR | Status: DC | PRN
  Administered 2017-02-19: 40 mg via INTRAVENOUS

## 2017-02-19 MED ORDER — METOPROLOL SUCCINATE ER 25 MG PO TB24: 25.0000 mg | ORAL_TABLET | Freq: Every day | ORAL | Status: DC

## 2017-02-19 MED ORDER — HYDROMORPHONE HCL 1 MG/ML IJ SOLN: 0.5000 mg | INTRAMUSCULAR | Status: DC | PRN

## 2017-02-19 MED ORDER — SCOPOLAMINE 1 MG/3DAYS TD PT72
MEDICATED_PATCH | TRANSDERMAL | Status: AC
  Filled 2017-02-19: qty 1

## 2017-02-19 MED ORDER — LIDOCAINE 2% (20 MG/ML) 5 ML SYRINGE
INTRAMUSCULAR | Status: AC
  Filled 2017-02-19: qty 5

## 2017-02-19 MED ORDER — CEFAZOLIN SODIUM-DEXTROSE 2-4 GM/100ML-% IV SOLN
2.0000 g | Freq: Three times a day (TID) | INTRAVENOUS | Status: AC
  Administered 2017-02-19 – 2017-02-20 (×2): 2 g via INTRAVENOUS
  Filled 2017-02-19 (×2): qty 100

## 2017-02-19 MED ORDER — LORAZEPAM 0.5 MG PO TABS
0.5000 mg | ORAL_TABLET | Freq: Every evening | ORAL | Status: DC
  Administered 2017-02-19: 0.5 mg via ORAL
  Filled 2017-02-19: qty 1

## 2017-02-19 MED ORDER — ALUM & MAG HYDROXIDE-SIMETH 200-200-20 MG/5ML PO SUSP: 30.0000 mL | Freq: Four times a day (QID) | ORAL | Status: DC | PRN

## 2017-02-19 MED ORDER — ACETAMINOPHEN 500 MG PO TABS: 1000.0000 mg | ORAL_TABLET | ORAL | Status: DC | PRN

## 2017-02-19 MED ORDER — BISACODYL 10 MG RE SUPP: 10.0000 mg | Freq: Every day | RECTAL | Status: DC | PRN

## 2017-02-19 MED ORDER — LACTATED RINGERS IV SOLN
INTRAVENOUS | Status: DC
  Administered 2017-02-19: 13:00:00 via INTRAVENOUS

## 2017-02-19 MED ORDER — CEFAZOLIN SODIUM-DEXTROSE 2-4 GM/100ML-% IV SOLN
2.0000 g | INTRAVENOUS | Status: AC
  Administered 2017-02-19: 2 g via INTRAVENOUS

## 2017-02-19 MED ORDER — ACETAMINOPHEN 325 MG PO TABS: 650.0000 mg | ORAL_TABLET | ORAL | Status: DC | PRN

## 2017-02-19 MED ORDER — FUROSEMIDE 20 MG PO TABS: 20.0000 mg | ORAL_TABLET | Freq: Every day | ORAL | Status: DC | PRN

## 2017-02-19 MED ORDER — FENTANYL CITRATE (PF) 100 MCG/2ML IJ SOLN
INTRAMUSCULAR | Status: DC | PRN
  Administered 2017-02-19: 50 ug via INTRAVENOUS

## 2017-02-19 MED ORDER — FENTANYL CITRATE (PF) 250 MCG/5ML IJ SOLN
INTRAMUSCULAR | Status: AC
Start: 2017-02-19 — End: ?
  Filled 2017-02-19: qty 5

## 2017-02-19 MED ORDER — BUPIVACAINE HCL (PF) 0.5 % IJ SOLN
INTRAMUSCULAR | Status: DC | PRN
  Administered 2017-02-19: 10 mL

## 2017-02-19 MED ORDER — HYDROCORTISONE ACE-PRAMOXINE 2.5-1 % RE CREA
TOPICAL_CREAM | Freq: Three times a day (TID) | RECTAL | Status: DC | PRN
  Filled 2017-02-19: qty 30

## 2017-02-19 MED ORDER — ONDANSETRON HCL 4 MG PO TABS: 4.0000 mg | ORAL_TABLET | Freq: Three times a day (TID) | ORAL | Status: DC | PRN

## 2017-02-19 MED ORDER — ONDANSETRON HCL 4 MG/2ML IJ SOLN: 4.0000 mg | Freq: Four times a day (QID) | INTRAMUSCULAR | Status: DC | PRN

## 2017-02-19 MED ORDER — METHOCARBAMOL 1000 MG/10ML IJ SOLN
500.0000 mg | Freq: Four times a day (QID) | INTRAVENOUS | Status: DC | PRN
  Filled 2017-02-19: qty 5

## 2017-02-19 MED ORDER — ACETAMINOPHEN 650 MG RE SUPP: 650.0000 mg | RECTAL | Status: DC | PRN

## 2017-02-19 MED ORDER — LIDOCAINE-EPINEPHRINE 1 %-1:100000 IJ SOLN
INTRAMUSCULAR | Status: DC | PRN
  Administered 2017-02-19: 10 mL

## 2017-02-19 MED ORDER — KCL IN DEXTROSE-NACL 20-5-0.45 MEQ/L-%-% IV SOLN
INTRAVENOUS | Status: AC
  Filled 2017-02-19: qty 1000

## 2017-02-19 MED ORDER — IOPAMIDOL (ISOVUE-300) INJECTION 61%
INTRAVENOUS | Status: DC | PRN
  Administered 2017-02-19: 50 mL via INTRAVENOUS

## 2017-02-19 MED ORDER — AMLODIPINE BESYLATE 5 MG PO TABS
10.0000 mg | ORAL_TABLET | Freq: Every day | ORAL | Status: DC
  Administered 2017-02-19: 10 mg via ORAL
  Filled 2017-02-19: qty 2

## 2017-02-19 MED ORDER — HYDROCORTISONE ACE-PRAMOXINE 1-1 % RE CREA
TOPICAL_CREAM | Freq: Three times a day (TID) | RECTAL | Status: DC | PRN
  Filled 2017-02-19: qty 30

## 2017-02-19 MED ORDER — KCL IN DEXTROSE-NACL 20-5-0.45 MEQ/L-%-% IV SOLN
INTRAVENOUS | Status: DC
  Administered 2017-02-19: 19:00:00 via INTRAVENOUS

## 2017-02-19 MED ORDER — FENTANYL CITRATE (PF) 100 MCG/2ML IJ SOLN
25.0000 ug | INTRAMUSCULAR | Status: DC | PRN
  Administered 2017-02-19 (×2): 25 ug via INTRAVENOUS
  Administered 2017-02-19: 50 ug via INTRAVENOUS

## 2017-02-19 MED ORDER — LOSARTAN POTASSIUM 50 MG PO TABS
50.0000 mg | ORAL_TABLET | Freq: Two times a day (BID) | ORAL | Status: DC
  Administered 2017-02-19: 50 mg via ORAL
  Filled 2017-02-19: qty 1

## 2017-02-19 MED ORDER — PHENOL 1.4 % MT LIQD: 1.0000 | OROMUCOSAL | Status: DC | PRN

## 2017-02-19 MED ORDER — SUGAMMADEX SODIUM 200 MG/2ML IV SOLN
INTRAVENOUS | Status: AC
  Filled 2017-02-19: qty 2

## 2017-02-19 MED ORDER — SUGAMMADEX SODIUM 200 MG/2ML IV SOLN
INTRAVENOUS | Status: DC | PRN
  Administered 2017-02-19: 150 mg via INTRAVENOUS

## 2017-02-19 MED ORDER — SODIUM CHLORIDE 0.9 % IV SOLN: 250.0000 mL | INTRAVENOUS | Status: DC

## 2017-02-19 MED ORDER — POTASSIUM CHLORIDE CRYS ER 20 MEQ PO TBCR: 20.0000 meq | EXTENDED_RELEASE_TABLET | Freq: Every day | ORAL | Status: DC | PRN

## 2017-02-19 MED ORDER — SCOPOLAMINE 1 MG/3DAYS TD PT72
MEDICATED_PATCH | TRANSDERMAL | Status: DC | PRN
  Administered 2017-02-19: 1 via TRANSDERMAL

## 2017-02-19 MED ORDER — CARBOXYMETHYLCELLUL-GLYCERIN 0.5-0.9 % OP SOLN
2.0000 [drp] | Freq: Three times a day (TID) | OPHTHALMIC | Status: DC
  Filled 2017-02-19: qty 15

## 2017-02-19 MED ORDER — IOPAMIDOL (ISOVUE-300) INJECTION 61%
INTRAVENOUS | Status: AC
  Filled 2017-02-19: qty 50

## 2017-02-19 MED ORDER — ZOLPIDEM TARTRATE 5 MG PO TABS
5.0000 mg | ORAL_TABLET | Freq: Every evening | ORAL | Status: DC | PRN
  Administered 2017-02-19: 5 mg via ORAL
  Filled 2017-02-19: qty 1

## 2017-02-19 MED ORDER — DEXAMETHASONE SODIUM PHOSPHATE 10 MG/ML IJ SOLN
INTRAMUSCULAR | Status: AC
  Filled 2017-02-19: qty 1

## 2017-02-19 MED ORDER — POLYVINYL ALCOHOL 1.4 % OP SOLN
2.0000 [drp] | Freq: Three times a day (TID) | OPHTHALMIC | Status: DC | PRN
  Filled 2017-02-19: qty 15

## 2017-02-19 SURGICAL SUPPLY — 41 items
BLADE CLIPPER SURG (BLADE) IMPLANT
BLADE SURG 15 STRL LF DISP TIS (BLADE) ×1 IMPLANT
BLADE SURG 15 STRL SS (BLADE) ×1
CARTRIDGE OIL MAESTRO DRILL (MISCELLANEOUS) ×1 IMPLANT
CEMENT BONE KYPHX HV R (Orthopedic Implant) ×2 IMPLANT
CEMENT KYPHON C01A KIT/MIXER (Cement) ×2 IMPLANT
DECANTER SPIKE VIAL GLASS SM (MISCELLANEOUS) ×2 IMPLANT
DERMABOND ADVANCED (GAUZE/BANDAGES/DRESSINGS) ×1
DERMABOND ADVANCED .7 DNX12 (GAUZE/BANDAGES/DRESSINGS) ×1 IMPLANT
DIFFUSER DRILL AIR PNEUMATIC (MISCELLANEOUS) ×2 IMPLANT
DRAPE C-ARM 42X72 X-RAY (DRAPES) ×2 IMPLANT
DRAPE HALF SHEET 40X57 (DRAPES) ×2 IMPLANT
DRAPE LAPAROTOMY 100X72X124 (DRAPES) ×2 IMPLANT
DRAPE SURG 17X23 STRL (DRAPES) ×2 IMPLANT
DRAPE WARM FLUID 44X44 (DRAPE) ×2 IMPLANT
DURAPREP 26ML APPLICATOR (WOUND CARE) ×2 IMPLANT
GAUZE SPONGE 4X4 16PLY XRAY LF (GAUZE/BANDAGES/DRESSINGS) ×2 IMPLANT
GLOVE BIO SURGEON STRL SZ8 (GLOVE) ×2 IMPLANT
GLOVE BIOGEL PI IND STRL 8.5 (GLOVE) ×1 IMPLANT
GLOVE BIOGEL PI INDICATOR 8.5 (GLOVE) ×1
GLOVE EXAM NITRILE LRG STRL (GLOVE) IMPLANT
GLOVE EXAM NITRILE XL STR (GLOVE) IMPLANT
GLOVE EXAM NITRILE XS STR PU (GLOVE) IMPLANT
GOWN STRL REUS W/ TWL LRG LVL3 (GOWN DISPOSABLE) IMPLANT
GOWN STRL REUS W/ TWL XL LVL3 (GOWN DISPOSABLE) IMPLANT
GOWN STRL REUS W/TWL 2XL LVL3 (GOWN DISPOSABLE) IMPLANT
GOWN STRL REUS W/TWL LRG LVL3 (GOWN DISPOSABLE)
GOWN STRL REUS W/TWL XL LVL3 (GOWN DISPOSABLE)
KIT BASIN OR (CUSTOM PROCEDURE TRAY) ×2 IMPLANT
KIT ROOM TURNOVER OR (KITS) ×2 IMPLANT
NEEDLE HYPO 25X1 1.5 SAFETY (NEEDLE) ×2 IMPLANT
NS IRRIG 1000ML POUR BTL (IV SOLUTION) ×2 IMPLANT
OIL CARTRIDGE MAESTRO DRILL (MISCELLANEOUS) ×2
PACK SURGICAL SETUP 50X90 (CUSTOM PROCEDURE TRAY) ×2 IMPLANT
PAD ARMBOARD 7.5X6 YLW CONV (MISCELLANEOUS) ×6 IMPLANT
STAPLER SKIN PROX WIDE 3.9 (STAPLE) ×2 IMPLANT
SUT VIC AB 3-0 SH 8-18 (SUTURE) ×2 IMPLANT
SYR CONTROL 10ML LL (SYRINGE) ×4 IMPLANT
TOWEL GREEN STERILE (TOWEL DISPOSABLE) ×2 IMPLANT
TOWEL GREEN STERILE FF (TOWEL DISPOSABLE) ×2 IMPLANT
TRAY KYPHOPAK 20/3 ONESTEP 1ST (MISCELLANEOUS) ×2 IMPLANT

## 2017-02-19 NOTE — Brief Op Note (Signed)
02/19/2017  5:56 PM  PATIENT:  Katherine Lane  84 y.o. female  PRE-OPERATIVE DIAGNOSIS:  COMPRESSION FRACTURE, LUMBAR ONE  POST-OPERATIVE DIAGNOSIS:  COMPRESSION FRACTURE, LUMBAR ONE  PROCEDURE:  Procedure(s) with comments: KYPHOPLASTY LUMBAR ONE (N/A) - KYPHOPLASTY LUMBAR ONE  SURGEON:  Surgeon(s) and Role:    Maeola Harman* Laylana Gerwig, MD - Primary  PHYSICIAN ASSISTANT:   ASSISTANTS: none   ANESTHESIA:   general  EBL:  2 mL   BLOOD ADMINISTERED:none  DRAINS: none   LOCAL MEDICATIONS USED:  MARCAINE    and LIDOCAINE   SPECIMEN:  No Specimen  DISPOSITION OF SPECIMEN:  N/A  COUNTS:  YES  TOURNIQUET:  * No tourniquets in log *  DICTATION: Patient is 84 year old woman with osteoporosis.  She previously had an L 2, L 3, 5 compression fracture and has now developed a painful L 1 compression fracture, which is proving debilitatingly painful to her.  It was elected to take her to surgery for kyphoplasty procedure of L 1.  PROCEDURE:  Following the smooth and uncomplicated induction of general endotracheal anesthesia, the patient was placed in a prone position on chest rolls.  C-arm fluoroscopy was positioned in both the AP and lateral planes, centered on the L 1 vertebra.  Her back was prepped and draped in the usual sterile fashion with betadine scrub and Duraprep.  Using a bi-pedicular approach, the L 1 pedicle and vertebral body were entered with the trochar using standard landmarks.  The drill was used, followed by a 20 cc Kyphon balloon, which was used to re-expand the broken vertebra.  Subsequently, 9 cc of bone cement was placed into the void created by the balloons and was seen to fill the fracture cleft and fill the vertebra in both the AP and lateral direction with good interdigitation and with a small amount of apparent extravasation.  Upon seeing this, the fill was stopped. The bone void fillers were then removed.  Final X-ray demonstrated good filling within the fractured vertebra.   The incisions were closed with two single 3-0 vicryl stitches and dressed with Dermabond. The patient was returned to the OR gurney and extubated in the OR and taken to Recovery in stable and satisfactory condition, having tolerated the procedure well.  Counts were correct at the end of the case.   PLAN OF CARE: Admit for overnight observation  PATIENT DISPOSITION:  PACU - hemodynamically stable.   Delay start of Pharmacological VTE agent (>24hrs) due to surgical blood loss or risk of bleeding: yes

## 2017-02-19 NOTE — Anesthesia Postprocedure Evaluation (Signed)
Anesthesia Post Note  Patient: Harvel RicksBetty M Braunschweig  Procedure(s) Performed: KYPHOPLASTY LUMBAR ONE (N/A )     Patient location during evaluation: PACU Anesthesia Type: General Level of consciousness: awake Pain management: pain level controlled Vital Signs Assessment: post-procedure vital signs reviewed and stable Respiratory status: spontaneous breathing Cardiovascular status: stable Anesthetic complications: no    Last Vitals:  Vitals:   02/19/17 1845 02/19/17 1900  BP: (!) 162/85 (!) 165/83  Pulse: 76 77  Resp: 20 20  Temp:    SpO2: 93% 93%    Last Pain:  Vitals:   02/19/17 1900  TempSrc:   PainSc: 3     LLE Motor Response: Purposeful movement;Responds to commands (02/19/17 1900) LLE Sensation: Full sensation (02/19/17 1900) RLE Motor Response: Purposeful movement;Responds to commands (02/19/17 1900) RLE Sensation: Full sensation (02/19/17 1900)      Irmalee Riemenschneider

## 2017-02-19 NOTE — Plan of Care (Signed)
  Progressing Education: Knowledge of General Education information will improve 02/19/2017 2255 - Progressing by Mel AlmondAguilar, Angelos Wasco D, RN Clinical Measurements: Will remain free from infection 02/19/2017 2255 - Progressing by Mel AlmondAguilar, Darlen Gledhill D, RN Activity: Risk for activity intolerance will decrease 02/19/2017 2255 - Progressing by Mel AlmondAguilar, Liah Morr D, RN Nutrition: Adequate nutrition will be maintained 02/19/2017 2255 - Progressing by Threasa BeardsAguilar, Ryzen Deady D, RN Coping: Level of anxiety will decrease 02/19/2017 2255 - Progressing by Mel AlmondAguilar, Sahir Tolson D, RN Elimination: Will not experience complications related to bowel motility 02/19/2017 2255 - Progressing by Mel AlmondAguilar, Amire Gossen D, RN Will not experience complications related to urinary retention 02/19/2017 2255 - Progressing by Mel AlmondAguilar, Breylin Dom D, RN Pain Managment: General experience of comfort will improve 02/19/2017 2255 - Progressing by Mel AlmondAguilar, Ronne Stefanski D, RN Safety: Ability to remain free from injury will improve 02/19/2017 2255 - Progressing by Mel AlmondAguilar, Shaquavia Whisonant D, RN Skin Integrity: Risk for impaired skin integrity will decrease 02/19/2017 2255 - Progressing by Mel AlmondAguilar, Thurmond Hildebran D, RN

## 2017-02-19 NOTE — Transfer of Care (Signed)
Immediate Anesthesia Transfer of Care Note  Patient: Katherine Lane  Procedure(s) Performed: KYPHOPLASTY LUMBAR ONE (N/A )  Patient Location: PACU  Anesthesia Type:General  Level of Consciousness: awake, alert , oriented and patient cooperative  Airway & Oxygen Therapy: Patient Spontanous Breathing and Patient connected to nasal cannula oxygen  Post-op Assessment: Report given to RN, Post -op Vital signs reviewed and stable and Patient moving all extremities X 4  Post vital signs: Reviewed and stable  Last Vitals:  Vitals:   02/19/17 1256 02/19/17 1800  BP: (!) 168/68 (!) 165/89  Pulse: 85 83  Resp: 18 19  Temp: 36.7 C (!) 36.2 C  SpO2: 98% 97%    Last Pain:  Vitals:   02/19/17 1256  TempSrc: Oral      Patients Stated Pain Goal: 0 (02/19/17 1308)  Complications: No apparent anesthesia complications

## 2017-02-19 NOTE — Anesthesia Procedure Notes (Signed)
Procedure Name: Intubation Date/Time: 02/19/2017 5:00 PM Performed by: Waynard EdwardsSmith, Myli Pae A, CRNA Pre-anesthesia Checklist: Patient identified, Emergency Drugs available, Suction available and Patient being monitored Patient Re-evaluated:Patient Re-evaluated prior to induction Oxygen Delivery Method: Circle system utilized Preoxygenation: Pre-oxygenation with 100% oxygen Induction Type: IV induction Ventilation: Mask ventilation without difficulty Laryngoscope Size: Miller and 2 Grade View: Grade I Tube type: Oral Tube size: 7.0 mm Number of attempts: 1 Airway Equipment and Method: Stylet Placement Confirmation: ETT inserted through vocal cords under direct vision,  positive ETCO2 and breath sounds checked- equal and bilateral Secured at: 22 cm Tube secured with: Tape Dental Injury: Teeth and Oropharynx as per pre-operative assessment

## 2017-02-19 NOTE — H&P (Signed)
  Patient ID:   [id number] Patient: Katherine Lane  Date of Birth: 02-Jun-1932 Visit Type: Office Visit   Date: 02/16/2017 01:15 PM Provider: Danae OrleansJoseph D. Venetia MaxonStern MD   This 84 year old female presents for back pain.   History of Present Illness: 1.  back pain  Patient returns noting a new fracture at L1 was discovered on x-ray from her PCP. She reports unable to lay in a bed due to pain.           MEDICATIONS(added, continued or stopped this visit): Started Medication Directions Instruction Stopped   Ambien 10 mg tablet take 1 tablet by oral route  every day at bedtime     enalapril maleate 20 mg tablet take 1 tablet by oral route 2 times every day    12/17/2016 gabapentin 100 mg capsule take 1 capsule by oral route 3 times every day    02/16/2017 hydromorphone 2 mg tablet take 0.5 tablet by oral route  every 4 - 6 hours as needed     lorazepam 0.5 mg tablet take 1 tablet by oral route 3 times every day as needed     losartan 50 mg tablet take 1 tablet by oral route  every day     metoprolol tartrate 25 mg tablet take 1 tablet by oral route 2 times every day    01/16/2017 tizanidine 2 mg tablet take 1 tablet by oral route  every 8 hours as needed for muscle spasms/pain     verapamil ER (SR) 240 mg tablet,extended release take 1 tablet by oral route  every day with food     Xarelto 20 mg tablet take 1 tablet by oral route  every day with the evening meal       ALLERGIES: Ingredient Reaction Medication Name Comment  CODEINE Nausea/Vomiting     Reviewed, no changes.    Vitals Date Temp F BP Pulse Ht In Wt Lb BMI BSA Pain Score  02/16/2017  175/92 109 63 138 24.45  10/10      IMPRESSION Patient returns noting a new fracture and increased back pain. X-ray confirms a new fracture at L1 with further disc collapse. Schedule L1 kyphoplasty. Prescribed Dilaudid.  Patient placed in LSO brace to help with pain management.  Assessment/Plan # Detail Type Description   1.  Assessment Wedge compression fracture of second lumbar vertebra, init (S32.020A).           Pain Management Plan Pain Scale: 10/10. Method: Numeric Pain Intensity Scale. Location: back. Onset: 05/21/2015.  Prescribe Dilaudid. Schedule L1 kyphoplasty. Fit for hydromorphone.     MEDICATIONS PRESCRIBED TODAY    Rx Quantity Refills  HYDROMORPHONE HCL 2 mg  30 0            Provider:  Venetia MaxonStern MDDanae Orleans, Victor Granados D 02/16/2017 2:31 PM  Dictation edited by: Lajoyce LauberLisa Hoyt    CC Providers: Ardyth GalWilliam  Luking [address] [place]  [phone]   Rolland PorterScott Luking  [facility] [address] [place], [place] [phone]              Electronically signed by Danae OrleansJoseph D. Venetia MaxonStern MD on 02/17/2017 05:23 PM

## 2017-02-19 NOTE — Op Note (Signed)
02/19/2017  5:56 PM  PATIENT:  Katherine Lane  84 y.o. female  PRE-OPERATIVE DIAGNOSIS:  COMPRESSION FRACTURE, LUMBAR ONE  POST-OPERATIVE DIAGNOSIS:  COMPRESSION FRACTURE, LUMBAR ONE  PROCEDURE:  Procedure(s) with comments: KYPHOPLASTY LUMBAR ONE (N/A) - KYPHOPLASTY LUMBAR ONE  SURGEON:  Surgeon(s) and Role:    * Sheena Simonis, MD - Primary  PHYSICIAN ASSISTANT:   ASSISTANTS: none   ANESTHESIA:   general  EBL:  2 mL   BLOOD ADMINISTERED:none  DRAINS: none   LOCAL MEDICATIONS USED:  MARCAINE    and LIDOCAINE   SPECIMEN:  No Specimen  DISPOSITION OF SPECIMEN:  N/A  COUNTS:  YES  TOURNIQUET:  * No tourniquets in log *  DICTATION: Patient is 84 year old woman with osteoporosis.  She previously had an L 2, L 3, 5 compression fracture and has now developed a painful L 1 compression fracture, which is proving debilitatingly painful to her.  It was elected to take her to surgery for kyphoplasty procedure of L 1.  PROCEDURE:  Following the smooth and uncomplicated induction of general endotracheal anesthesia, the patient was placed in a prone position on chest rolls.  C-arm fluoroscopy was positioned in both the AP and lateral planes, centered on the L 1 vertebra.  Her back was prepped and draped in the usual sterile fashion with betadine scrub and Duraprep.  Using a bi-pedicular approach, the L 1 pedicle and vertebral body were entered with the trochar using standard landmarks.  The drill was used, followed by a 20 cc Kyphon balloon, which was used to re-expand the broken vertebra.  Subsequently, 9 cc of bone cement was placed into the void created by the balloons and was seen to fill the fracture cleft and fill the vertebra in both the AP and lateral direction with good interdigitation and with a small amount of apparent extravasation.  Upon seeing this, the fill was stopped. The bone void fillers were then removed.  Final X-ray demonstrated good filling within the fractured vertebra.   The incisions were closed with two single 3-0 vicryl stitches and dressed with Dermabond. The patient was returned to the OR gurney and extubated in the OR and taken to Recovery in stable and satisfactory condition, having tolerated the procedure well.  Counts were correct at the end of the case.   PLAN OF CARE: Admit for overnight observation  PATIENT DISPOSITION:  PACU - hemodynamically stable.   Delay start of Pharmacological VTE agent (>24hrs) due to surgical blood loss or risk of bleeding: yes  

## 2017-02-19 NOTE — Anesthesia Preprocedure Evaluation (Addendum)
Anesthesia Evaluation  Patient identified by MRN, date of birth, ID band Patient awake    Reviewed: Allergy & Precautions, H&P , NPO status , Patient's Chart, lab work & pertinent test results, reviewed documented beta blocker date and time   History of Anesthesia Complications (+) PONV  Airway Mallampati: II  TM Distance: >3 FB Neck ROM: full    Dental no notable dental hx. (+) Teeth Intact, Dental Advisory Given   Pulmonary    Pulmonary exam normal breath sounds clear to auscultation       Cardiovascular hypertension, On Medications and Pt. on home beta blockers +CHF  Atrial Fibrillation  Rhythm:Regular Rate:Normal     Neuro/Psych PSYCHIATRIC DISORDERS Anxiety Depression    GI/Hepatic Neg liver ROS, GERD  ,  Endo/Other  negative endocrine ROS  Renal/GU negative Renal ROS     Musculoskeletal  (+) Arthritis ,   Abdominal   Peds  Hematology   Anesthesia Other Findings Ventilation: Mask ventilation without difficulty Laryngoscope Size: Mac and 3 Grade View: Grade III Tube type: Oral Tube size: 7.0 mm   Echo 07/01/16: Study Conclusions - Left ventricle: The cavity size was normal. Wall thickness wasincreased in a pattern of mild LVH. Systolic function was vigorous. The estimated ejection fraction was in the range of 65% to 70%    Reproductive/Obstetrics                         Anesthesia Physical  Anesthesia Plan  ASA: III  Anesthesia Plan: General   Post-op Pain Management:    Induction: Intravenous  PONV Risk Score and Plan: 4 or greater and 1 and Ondansetron, Dexamethasone and Treatment may vary due to age or medical condition  Airway Management Planned: Oral ETT  Additional Equipment:   Intra-op Plan:   Post-operative Plan: Possible Post-op intubation/ventilation  Informed Consent: I have reviewed the patients History and Physical, chart, labs and discussed the  procedure including the risks, benefits and alternatives for the proposed anesthesia with the patient or authorized representative who has indicated his/her understanding and acceptance.   Dental advisory given  Plan Discussed with: CRNA, Anesthesiologist and Surgeon  Anesthesia Plan Comments:         Anesthesia Quick Evaluation

## 2017-02-19 NOTE — Interval H&P Note (Signed)
History and Physical Interval Note:  02/19/2017 4:50 PM  Katherine Lane  has presented today for surgery, with the diagnosis of COMPRESSION FRACTURE, LUMBAR  The various methods of treatment have been discussed with the patient and family. After consideration of risks, benefits and other options for treatment, the patient has consented to  Procedure(s) with comments: KYPHOPLASTY LUMBAR ONE (N/A) - KYPHOPLASTY LUMBAR ONE as a surgical intervention .  The patient's history has been reviewed, patient examined, no change in status, stable for surgery.  I have reviewed the patient's chart and labs.  Questions were answered to the patient's satisfaction.     Zakaiya Lares D

## 2017-02-19 NOTE — Progress Notes (Signed)
Awake, alert, conversant.  Sore in back, but better than preop.  Full strength in legs without numbness.  Doing well.

## 2017-02-20 ENCOUNTER — Encounter (HOSPITAL_COMMUNITY): Payer: Self-pay | Admitting: Neurosurgery

## 2017-02-20 DIAGNOSIS — M4856XA Collapsed vertebra, not elsewhere classified, lumbar region, initial encounter for fracture: Secondary | ICD-10-CM | POA: Diagnosis not present

## 2017-02-20 NOTE — Discharge Instructions (Signed)

## 2017-02-20 NOTE — Discharge Summary (Signed)
Physician Discharge Summary  Patient ID: Katherine Lane MRN: [id number] DOB/AGE: 1932-08-05 84 y.o.  Admit date: 02/19/2017 Discharge date: 02/20/2017  Admission Diagnoses: COMPRESSION FRACTURE, LUMBAR ONE    Discharge Diagnoses: COMPRESSION FRACTURE, LUMBAR ONE s/p KYPHOPLASTY LUMBAR ONE (N/A) - KYPHOPLASTY LUMBAR ONE   Active Problems:   Compression fracture of L1 lumbar vertebra [facility])   Discharged Condition: good  Hospital Course: Katherine Lane was admitted for surgery with dx compression fracture L1. Following uncomplicated kyphoplasty L1, she is mobilizing nicely with significant pain relief.   Consults: None  Significant Diagnostic Studies: radiology: X-Ray: intra-op  Treatments: surgery: KYPHOPLASTY LUMBAR ONE (N/A) - KYPHOPLASTY LUMBAR ONE    Discharge Exam: Blood pressure 122/70, pulse 94, temperature 98.1 F (36.7 C), temperature source Oral, resp. rate 18, height 5' 1.5" (1.562 m), weight 62.2 kg (137 lb 0.6 oz), SpO2 96 %. Alert, conversant, smiling. Daughter present. Pt reports no pain this am. Stab incisions L1 region without erythema, swelling, or drainage. Dermabond. Good strength BLE.    Disposition: 01-Home or Self Care Discharge to home. Pt verbalizes understanding of d/c instructions and will f/u in office in 3-4wks. She has a TLSO for prn use (pain) and has Dilaudid for prn use at home.      Allergies as of 02/20/2017      Reactions   Codeine Nausea And Vomiting, Other (See Comments)   Patient states "intolerance to all pain medications"  (NO OPIOIDS) VERY VIOLENT VOMITING!!   Other Nausea And Vomiting, Other (See Comments)   general anesthesia   Tramadol Nausea And Vomiting, Other (See Comments)   "NO OPIOIDS!!! VERY VIOLENT VOMITING!!" per Med History prior to 02/18/17      Medication List    TAKE these medications   acetaminophen 500 MG tablet Commonly known as:  TYLENOL Take 1,000 mg by mouth every 4 (four) hours as  needed for moderate pain or headache.   alendronate 70 MG tablet Commonly known as:  FOSAMAX Take 1 tablet (70 mg total) by mouth every 7 (seven) days. Take with a full glass of water on an empty stomach.   amLODipine 10 MG tablet Commonly known as:  NORVASC Take 1 tablet (10 mg total) by mouth daily.   ANALPRAM-HC 1-1 % rectal cream Generic drug:  pramoxine-hydrocortisone APPLY TO AFFECTED AREAS TWICE DAILY. What changed:  See the new instructions.   hydrocortisone-pramoxine 2.5-1 % rectal cream Commonly known as:  ANALPRAM-HC APPLY TO HEMORRHOIDS 4 TIMES DAILY AS NEEDED. What changed:  See the new instructions.   furosemide 20 MG tablet Commonly known as:  LASIX Take 1 tablet (20 mg total) by mouth daily as needed for fluid or edema. For weight gain of 3 Lbs in 1 day or 5 Lbs in 2 days.   Hydrocortisone Ace-Pramoxine 2.5-1 % Crea Apply to hemorrhoids QID prn What changed:    how much to take  how to take this  when to take this  reasons to take this  additional instructions   HYDROmorphone 2 MG tablet Commonly known as:  DILAUDID Take 1 mg by mouth every 6 (six) hours as needed for moderate pain or severe pain.   LORazepam 0.5 MG tablet Commonly known as:  ATIVAN One up to BID What changed:    how much to take  how to take this  when to take this  additional instructions   losartan 50 MG tablet Commonly known as:  COZAAR Take 1 tablet (50 mg total) 2 (two) times daily by  mouth.   LUBRICATING EYE DROPS OP Place 1 drop into both eyes 3 (three) times daily.   metoprolol succinate 25 MG 24 hr tablet Commonly known as:  TOPROL-XL Take 25 mg by mouth in the morning What changed:    how much to take  how to take this  when to take this  additional instructions   MIRALAX powder Generic drug:  polyethylene glycol powder Take 17 g daily as needed by mouth for moderate constipation.   ondansetron 4 MG tablet Commonly known as:  ZOFRAN Take 4 mg  by mouth every 8 (eight) hours as needed for nausea or vomiting.   potassium chloride SA 20 MEQ tablet Commonly known as:  K-DUR,KLOR-CON Take 0.5 tablets (10 mEq total) by mouth 2 (two) times daily. What changed:    how much to take  when to take this  reasons to take this  additional instructions   rivaroxaban 20 MG Tabs tablet Commonly known as:  XARELTO TAKE 1 TABLET DAILY WITH SUPPER. What changed:    how much to take  how to take this  when to take this  additional instructions   zolpidem 10 MG tablet Commonly known as:  AMBIEN TAKE 1 TABLET BY MOUTH AT BEDTIME FOR SLEEP.        Signed: Dorian HeckleSTERN,Ryver Zadrozny D, MD 02/20/2017, 7:38 AM

## 2017-02-20 NOTE — Progress Notes (Signed)
Patient is discharged from room 3C11 at this time. Alert and in stable condition. IV site d/c'd and instructions read to patient and daughter with understanding verbalized. Left unit via wheelchair with all belongings at side. 

## 2017-02-20 NOTE — Progress Notes (Addendum)
Subjective: Patient reports "I'm not having any pain right now"  Objective: Vital signs in last 24 hours: Temp:  [97.2 F (36.2 C)-98.3 F (36.8 C)] 98.1 F (36.7 C) (01/04 0415) Pulse Rate:  [68-94] 94 (01/04 0415) Resp:  [16-20] 18 (01/04 0415) BP: (107-168)/(68-89) 122/70 (01/04 0415) SpO2:  [92 %-98 %] 96 % (01/04 0415) Weight:  [62.2 kg (137 lb 0.6 oz)] 62.2 kg (137 lb 0.6 oz) (01/03 1256)  Intake/Output from previous day: 01/03 0701 - 01/04 0700 In: 743 [P.O.:240; I.V.:403; IV Piggyback:100] Out: 2 [Blood:2] Intake/Output this shift: No intake/output data recorded.  Alert, conversant, smiling. Daughter present. Pt reports no pain this am. Stab incisions L1 region without erythema, swelling, or drainage. Dermabond. Good strength BLE.   Lab Results: No results for input(s): WBC, HGB, HCT, PLT in the last 72 hours. BMET Recent Labs    02/19/17 1321  NA 140  K 3.7  CL 104  CO2 27  GLUCOSE 99  BUN 14  CREATININE 0.85  CALCIUM 9.6    Studies/Results: Dg Lumbar Spine 2-3 Views  Result Date: 02/19/2017 CLINICAL DATA:  L1 kyphoplasty EXAM: LUMBAR SPINE - 2-3 VIEW; DG C-ARM 61-120 MIN COMPARISON:  12/26/2016 FLUOROSCOPY TIME:  Fluoroscopy Time:  2 minutes 25 seconds Radiation Exposure Index (if provided by the fluoroscopic device): Available Number of Acquired Spot Images: 4 FINDINGS: Contrast laden cement is now seen within L1 with mild venous intravasation laterally on the left. Mild extension in the epidural venous plexus is noted posteriorly. No other focal abnormality is noted. IMPRESSION: L1 kyphoplasty. Changes of prior L2 and L3 kyphoplasty are noted. Electronically Signed   By: Alcide CleverMark  Lukens M.D.   On: 02/19/2017 20:15   Dg C-arm 1-60 Min  Result Date: 02/19/2017 CLINICAL DATA:  L1 kyphoplasty EXAM: LUMBAR SPINE - 2-3 VIEW; DG C-ARM 61-120 MIN COMPARISON:  12/26/2016 FLUOROSCOPY TIME:  Fluoroscopy Time:  2 minutes 25 seconds Radiation Exposure Index (if provided by  the fluoroscopic device): Available Number of Acquired Spot Images: 4 FINDINGS: Contrast laden cement is now seen within L1 with mild venous intravasation laterally on the left. Mild extension in the epidural venous plexus is noted posteriorly. No other focal abnormality is noted. IMPRESSION: L1 kyphoplasty. Changes of prior L2 and L3 kyphoplasty are noted. Electronically Signed   By: Alcide CleverMark  Lukens M.D.   On: 02/19/2017 20:15    Assessment/Plan: Improved  LOS: 1 day  Per Dr. Venetia MaxonStern, d/c to home. Pt verbalizes understanding of d/c instructions and will f/u in office in 3-4wks. She has a TLSO for prn use (pain) and has Dilaudid for prn use at home.    Georgiann Cockeroteat, Brian 02/20/2017, 7:27 AM   Patient smiling and happy this morning.  She says she no longer has any pain.

## 2017-02-20 NOTE — Evaluation (Signed)
Physical Therapy Evaluation Patient Details Name: Katherine Lane MRN: [id number] DOB: 1932/09/16 Today's Date: 02/20/2017   History of Present Illness  Pt is an 84 y/o female s/p with L2, L3, and L5 KYPHOPLASTY   Clinical Impression  Pt admitted with above diagnosis. Pt currently with functional limitations due to the deficits listed below (see PT Problem List). At the time of PT eval pt was mobilizing well with minor unsteadiness noted. Will keep on PT caseload until d/c to maximize safety with ambulation. Pt will benefit from skilled PT to increase their independence and safety with mobility to allow discharge to the venue listed below.       Follow Up Recommendations No PT follow up;Supervision/Assistance - 24 hour    Equipment Recommendations  None recommended by PT    Recommendations for Other Services       Precautions / Restrictions Precautions Precautions: Back Precaution Booklet Issued: No Precaution Comments: Pt able to recall 3/3 back precautions Other Brace/Splint: No brace per MD order Restrictions Weight Bearing Restrictions: No      Mobility  Bed Mobility               General bed mobility comments: Pt OOB upon arrival  Transfers Overall transfer level: Needs assistance Equipment used: None Transfers: Sit to/from Stand Sit to Stand: Supervision         General transfer comment: for safety  Ambulation/Gait Ambulation/Gait assistance: Supervision Ambulation Distance (Feet): 400 Feet Assistive device: None Gait Pattern/deviations: Step-through pattern;Decreased stride length Gait velocity: decreased   General Gait Details: mildly unsteady gait pattern but no overt LOB or need for physical assistance, supervision for safety  Stairs         General stair comments: Pt declined stair training.   Wheelchair Mobility    Modified Rankin (Stroke Patients Only)       Balance Overall balance assessment: Needs assistance Sitting-balance  support: Feet supported;No upper extremity supported Sitting balance-Leahy Scale: Normal     Standing balance support: During functional activity;No upper extremity supported Standing balance-Leahy Scale: Good                               Pertinent Vitals/Pain Pain Assessment: Faces Faces Pain Scale: Hurts a little bit Pain Location: back Pain Descriptors / Indicators: Grimacing;Sore Pain Intervention(s): Monitored during session    Home Living Family/patient expects to be discharged to:: Private residence Living Arrangements: Alone Available Help at Discharge: Family;Friend(s);Available PRN/intermittently Type of Home: House Home Access: Stairs to enter Entrance Stairs-Rails: Doctor, general practice of Steps: 2 Home Layout: One level Home Equipment: Walker - 2 wheels;Cane - single point;Shower seat;Bedside commode;Grab bars - tub/shower;Adaptive equipment;Hand held shower head      Prior Function Level of Independence: Independent with assistive device(s)         Comments: ADLs, IADLs, and for the past month pt has been ambulating with either a SPC or RW secondary to pain     Hand Dominance   Dominant Hand: Right    Extremity/Trunk Assessment   Upper Extremity Assessment Upper Extremity Assessment: Overall WFL for tasks assessed    Lower Extremity Assessment Lower Extremity Assessment: Overall WFL for tasks assessed    Cervical / Trunk Assessment Cervical / Trunk Assessment: Other exceptions Cervical / Trunk Exceptions: s/p lumbar kyphoplasty  Communication   Communication: No difficulties  Cognition Arousal/Alertness: Awake/alert Behavior During Therapy: WFL for tasks assessed/performed Overall Cognitive Status: Within Functional Limits  for tasks assessed                                        General Comments      Exercises     Assessment/Plan    PT Assessment Patient needs continued PT services  PT  Problem List Decreased strength;Decreased range of motion;Decreased activity tolerance;Decreased balance;Decreased mobility;Decreased knowledge of use of DME;Decreased safety awareness;Decreased knowledge of precautions;Pain       PT Treatment Interventions DME instruction;Gait training;Stair training;Functional mobility training;Therapeutic activities;Therapeutic exercise;Neuromuscular re-education;Patient/family education    PT Goals (Current goals can be found in the Care Plan section)  Acute Rehab PT Goals Patient Stated Goal: return home ASAP PT Goal Formulation: With patient Time For Goal Achievement: 02/27/17 Potential to Achieve Goals: Good    Frequency Min 5X/week   Barriers to discharge        Co-evaluation               AM-PAC PT "6 Clicks" Daily Activity  Outcome Measure Difficulty turning over in bed (including adjusting bedclothes, sheets and blankets)?: A Little Difficulty moving from lying on back to sitting on the side of the bed? : A Little Difficulty sitting down on and standing up from a chair with arms (e.g., wheelchair, bedside commode, etc,.)?: None Help needed moving to and from a bed to chair (including a wheelchair)?: None Help needed walking in hospital room?: None Help needed climbing 3-5 steps with a railing? : A Little 6 Click Score: 21    End of Session Equipment Utilized During Treatment: Gait belt Activity Tolerance: Patient tolerated treatment well Patient left: in chair;with call bell/phone within reach Nurse Communication: Mobility status PT Visit Diagnosis: Other abnormalities of gait and mobility (R26.89);Pain Pain - part of body: (back)    Time: [id number] PT Time Calculation (min) (ACUTE ONLY): 12 min   Charges:   PT Evaluation $PT Eval Low Complexity: 1 Low     PT G Codes:        Katherine Lane, PT, DPT Acute Rehabilitation Services Pager: [phone]   Katherine Lane 02/20/2017, 9:22 AM

## 2017-02-20 NOTE — Progress Notes (Signed)
Occupational Therapy Treatment Patient Details Name: Katherine Lane MRN: [id number] DOB: 1933/02/17 Today's Date: 02/20/2017    History of present illness Pt is an 84 y/o female s/p with L2, L3, and L5 KYPHOPLASTY    OT comments  Pt reports she was independent with ADL PTA. Currently pt overall supervision for ADL and functional mobility. All back, safety, and ADL education completed. Pt planning to d/c home with supervision from family initially then will be home alone. Recommending HH Aide to decrease burden of care and for safety with ADL. No further acute OT needs identified; signing off at this time. Please re-consult if needs change. Thank you for this referral.   Follow Up Recommendations  Other (comment);Supervision - Intermittent(HH Aide)    Equipment Recommendations  None recommended by OT    Recommendations for Other Services      Precautions / Restrictions Precautions Precautions: Back Precaution Booklet Issued: No Precaution Comments: Pt able to recall 3/3 back precautions Other Brace/Splint: No brace per MD order Restrictions Weight Bearing Restrictions: No       Mobility Bed Mobility               General bed mobility comments: Pt OOB upon arrival  Transfers Overall transfer level: Needs assistance Equipment used: None Transfers: Sit to/from Stand Sit to Stand: Supervision         General transfer comment: for safety    Balance Overall balance assessment: Needs assistance Sitting-balance support: Feet supported;No upper extremity supported Sitting balance-Leahy Scale: Normal     Standing balance support: During functional activity;No upper extremity supported Standing balance-Leahy Scale: Good                             ADL either performed or assessed with clinical judgement   ADL Overall ADL's : Needs assistance/impaired Eating/Feeding: Set up;Sitting   Grooming: Supervision/safety;Standing   Upper Body Bathing: Set  up;Sitting   Lower Body Bathing: Supervison/ safety;Sit to/from stand   Upper Body Dressing : Set up;Sitting   Lower Body Dressing: Minimal assistance;Sit to/from stand Lower Body Dressing Details (indicate cue type and reason): for socks, pt reports she typically uses sock aide Toilet Transfer: Supervision/safety;Ambulation     Toileting - Clothing Manipulation Details (indicate cue type and reason): Educated on peri care technique and use of wet wipes     Functional mobility during ADLs: Supervision/safety General ADL Comments: Educated pt on back precautions and maintaining during functional tasks.     Vision Baseline Vision/History: Wears glasses Wears Glasses: At all times Patient Visual Report: No change from baseline Vision Assessment?: No apparent visual deficits   Perception     Praxis      Cognition Arousal/Alertness: Awake/alert Behavior During Therapy: WFL for tasks assessed/performed Overall Cognitive Status: Within Functional Limits for tasks assessed                                          Exercises     Shoulder Instructions       General Comments      Pertinent Vitals/ Pain       Pain Assessment: Faces Faces Pain Scale: Hurts a little bit Pain Location: back Pain Descriptors / Indicators: Grimacing;Sore Pain Intervention(s): Monitored during session;Repositioned  Home Living Family/patient expects to be discharged to:: Private residence Living Arrangements: Alone Available Help at Discharge:  Family;Friend(s);Available PRN/intermittently Type of Home: House Home Access: Stairs to enter Entergy CorporationEntrance Stairs-Number of Steps: 2 Entrance Stairs-Rails: Right;Left Home Layout: One level     Bathroom Shower/Tub: Walk-in shower;Tub/shower unit   Bathroom Toilet: Standard     Home Equipment: Environmental consultantWalker - 2 wheels;Cane - single point;Shower seat;Bedside commode;Grab bars - tub/shower;Adaptive equipment;Hand held shower head Adaptive  Equipment: Reacher;Sock aid        Prior Functioning/Environment Level of Independence: Independent with assistive device(s)        Comments: ADLs, IADLs, and for the past month pt has been ambulating with either a SPC or RW secondary to pain   Frequency           Progress Toward Goals  OT Goals(current goals can now be found in the care plan section)     Acute Rehab OT Goals Patient Stated Goal: return home ASAP OT Goal Formulation: All assessment and education complete, DC therapy  Plan      Co-evaluation                 AM-PAC PT "6 Clicks" Daily Activity     Outcome Measure   Help from another person eating meals?: None Help from another person taking care of personal grooming?: A Little Help from another person toileting, which includes using toliet, bedpan, or urinal?: A Little Help from another person bathing (including washing, rinsing, drying)?: A Little Help from another person to put on and taking off regular upper body clothing?: None Help from another person to put on and taking off regular lower body clothing?: A Little 6 Click Score: 20    End of Session    OT Visit Diagnosis: Pain;Unsteadiness on feet (R26.81) Pain - part of body: (back)   Activity Tolerance Patient tolerated treatment well   Patient Left with family/visitor present;Other (comment)(sitting EOB)   Nurse Communication Mobility status;Other (comment)(f/u needs)        Time: [id number] OT Time Calculation (min): 15 min  Charges: OT General Charges $OT Visit: 1 Visit OT Evaluation $OT Eval Low Complexity: 1 Low  Kayren Holck A. Brett Albinooffey, M.S., OTR/L Pager: [phone]   Gaye AlkenBailey A Hendel Gatliff 02/20/2017, 8:17 AM

## 2017-03-26 ENCOUNTER — Other Ambulatory Visit: Payer: Self-pay | Admitting: Cardiovascular Disease

## 2017-04-08 ENCOUNTER — Telehealth: Payer: Self-pay

## 2017-04-08 NOTE — Telephone Encounter (Signed)
Bonnell PublicBetty Mcnulty needed a PA for Zolpidem 10 mg. It was approved through 02/16/2018 Under Medicare part D benefit.Reference # [id number]. Phone # [phone],Fax# [phone].GPI/NDC:[id number].

## 2017-04-09 ENCOUNTER — Other Ambulatory Visit: Payer: Self-pay

## 2017-04-09 ENCOUNTER — Encounter: Payer: Self-pay | Admitting: Adult Health

## 2017-04-09 ENCOUNTER — Ambulatory Visit: Payer: Medicare Other | Admitting: Adult Health

## 2017-04-09 VITALS — BP 148/76 | HR 56 | Ht 62.0 in | Wt 136.0 lb

## 2017-04-09 DIAGNOSIS — M810 Age-related osteoporosis without current pathological fracture: Secondary | ICD-10-CM

## 2017-04-09 DIAGNOSIS — I4891 Unspecified atrial fibrillation: Secondary | ICD-10-CM

## 2017-04-09 NOTE — Progress Notes (Signed)
Subjective:     Patient ID: Katherine Lane, female   DOB: [birth year], 84 y.o.   MRN: [id number]  HPI Katherine Lane is a 84 year old white female, widowed in to discuss prolia, she says she can't take fosamax it hurts her stomach, and she has had vertebral fractures and sees Dr Venetia MaxonStern.She also has a fib and would like her trash to be picked up at her house, not the street.  PCP is Dr Lubertha SouthSteve Luking.   Review of Systems  +a fib +back pain, has had vertebral fracture, has osteoporosis   Reviewed past medical,surgical, social and family history. Reviewed medications and allergies.     Objective:   Physical Exam BP (!) 148/76 (BP Location: Right Arm, Patient Position: Sitting, Cuff Size: Normal)   Pulse (!) 56   Ht 5\' 2"  (1.575 m)   Wt 136 lb (61.7 kg)   BMI 24.87 kg/m    Discussed prolia as option and gave her the pt information, but talk with Dr Gerda DissLuking, as her prescribed the fosamax and would also prescribe the prolia.   Assessment:     1. Osteoporosis, unspecified osteoporosis type, unspecified pathological fracture presence       Plan:     Given pt info on prolia  Talk with PCP and Dr Venetia MaxonStern about Prolia Given note to have trash pick up at her house not the street F/U prn

## 2017-04-21 ENCOUNTER — Ambulatory Visit: Payer: Medicare Other | Admitting: Cardiovascular Disease

## 2017-04-21 ENCOUNTER — Encounter: Payer: Self-pay | Admitting: Cardiovascular Disease

## 2017-04-21 VITALS — BP 138/78 | HR 69 | Ht 62.0 in | Wt 136.0 lb

## 2017-04-21 DIAGNOSIS — Z9289 Personal history of other medical treatment: Secondary | ICD-10-CM

## 2017-04-21 DIAGNOSIS — Z79899 Other long term (current) drug therapy: Secondary | ICD-10-CM | POA: Diagnosis not present

## 2017-04-21 DIAGNOSIS — I10 Essential (primary) hypertension: Secondary | ICD-10-CM

## 2017-04-21 DIAGNOSIS — I4819 Other persistent atrial fibrillation: Secondary | ICD-10-CM

## 2017-04-21 DIAGNOSIS — I5032 Chronic diastolic (congestive) heart failure: Secondary | ICD-10-CM | POA: Diagnosis not present

## 2017-04-21 DIAGNOSIS — I481 Persistent atrial fibrillation: Secondary | ICD-10-CM | POA: Diagnosis not present

## 2017-04-21 DIAGNOSIS — F419 Anxiety disorder, unspecified: Secondary | ICD-10-CM

## 2017-04-21 MED ORDER — CHLORTHALIDONE 25 MG PO TABS: 12.5000 mg | ORAL_TABLET | Freq: Every day | ORAL | 3 refills | Status: DC

## 2017-04-21 NOTE — Patient Instructions (Addendum)
Your physician wants you to follow-up in:3 weeks  with Randall AnBrittany Strader PA-C      STOP Amlodipine   START Chlorthalidone 12.5 mg every morning   Lab work :BMET 3 days after starting Chlorthalidone      No tets ordered today

## 2017-04-21 NOTE — Progress Notes (Signed)
SUBJECTIVE: [name] presents for routine follow-up.  She has an L1 compression fracture and underwent kyphoplasty on 02/19/17.  She underwent kyphoplasty for closed wedge compression fractures of L2, L3, and L5 due to intractable back pain on 12/26/16.  She was evaluated in the ED for hypertension on 02/11/17 with a blood pressure of 205/118.  She was seen for the same reason in the ED on 02/03/17.  Echocardiogram 07/01/16: Vigorous left ventricular systolic function, LVEF 65-70%, mild LVH, mild mitral, aortic, and tricuspid regurgitation.  She has a long history of malignant hypertension, chronic diastolic heart failure, paroxysmal atrial fibrillation, and anxiety and depression.  She said she has been feeling fatigued for the past 2 months.  Amlodipine led to leg swelling so she stopped taking it.  She started taking extra doses of metoprolol succinate on her own, sometimes 2 or 3 tablets at a time.  She denies chest pain and shortness of breath.  CBC was normal on 12/26/16 and basic metabolic panel was normal on 02/19/17.  She is here with her daughter.    Review of Systems: As per "subjective", otherwise negative.  Allergies  Allergen Reactions  . Codeine Nausea And Vomiting and Other (See Comments)    Patient states "intolerance to all pain medications"  (NO OPIOIDS) VERY VIOLENT VOMITING!!  . Other Nausea And Vomiting and Other (See Comments)    general anesthesia  . Tramadol Nausea And Vomiting and Other (See Comments)    "NO OPIOIDS!!! VERY VIOLENT VOMITING!!" per Med History prior to 02/18/17    Current Outpatient Medications  Medication Sig Dispense Refill  . acetaminophen (TYLENOL) 500 MG tablet Take 1,000 mg by mouth every 4 (four) hours as needed for moderate pain or headache.     . alendronate (FOSAMAX) 70 MG tablet Take 1 tablet (70 mg total) by mouth every 7 (seven) days. Take with a full glass of water on an empty stomach. 12 tablet 3  . ANALPRAM-HC 1-1 % rectal  cream APPLY TO AFFECTED AREAS TWICE DAILY. (Patient taking differently: APPLY TO AFFECTED AREAS TWICE DAILY AS NEEDED FOR HEMORRHOIDS) 28.4 g prn  . Carboxymethylcellul-Glycerin (LUBRICATING EYE DROPS OP) Place 1 drop into both eyes 3 (three) times daily.     . furosemide (LASIX) 20 MG tablet Take 1 tablet (20 mg total) by mouth daily as needed for fluid or edema. For weight gain of 3 Lbs in 1 day or 5 Lbs in 2 days. 90 tablet 3  . hydrocortisone-pramoxine (ANALPRAM-HC) 2.5-1 % rectal cream APPLY TO HEMORRHOIDS 4 TIMES DAILY AS NEEDED. (Patient taking differently: APPLY TO HEMORRHOIDS 4 TIMES DAILY AS NEEDED FOR HEMMORRHOIDS) 30 g 3  . HYDROmorphone (DILAUDID) 2 MG tablet Take 1 mg by mouth every 6 (six) hours as needed for moderate pain or severe pain.    Marland Kitchen. LORazepam (ATIVAN) 0.5 MG tablet One up to BID (Patient taking differently: Take 0.5 mg by mouth See admin instructions. BID prn, Pt takes 1 in am prn and takes 1 every evening) 60 tablet 5  . metoprolol succinate (TOPROL-XL) 25 MG 24 hr tablet Take 25 mg by mouth in the morning (Patient taking differently: Take 25 mg daily with lunch by mouth. ) 90 tablet 3  . ondansetron (ZOFRAN) 4 MG tablet Take 4 mg by mouth every 8 (eight) hours as needed for nausea or vomiting.    . polyethylene glycol powder (MIRALAX) powder Take 17 g daily as needed by mouth for moderate constipation.     .Marland Kitchen  potassium chloride SA (K-DUR,KLOR-CON) 20 MEQ tablet Take 0.5 tablets (10 mEq total) by mouth 2 (two) times daily. (Patient taking differently: Take 20 mEq daily as needed by mouth (swelling). Take with lasix) 180 tablet 3  . Pramoxine-HC (HYDROCORTISONE ACE-PRAMOXINE) 2.5-1 % CREA Apply to hemorrhoids QID prn (Patient taking differently: Place 1 application rectally 4 (four) times daily as needed (for hemorrhoids). ) 1 Tube 0  . XARELTO 20 MG TABS tablet TAKE 1 TABLET DAILY WITH SUPPER. 90 tablet 2  . zolpidem (AMBIEN) 10 MG tablet TAKE 1 TABLET BY MOUTH AT BEDTIME FOR  SLEEP. 30 tablet 5  . amLODipine (NORVASC) 10 MG tablet Take 1 tablet (10 mg total) by mouth daily. (Patient not taking: Reported on 04/21/2017) 90 tablet 1  . losartan (COZAAR) 50 MG tablet Take 1 tablet (50 mg total) 2 (two) times daily by mouth. 180 tablet 3   No current facility-administered medications for this visit.     Past Medical History:  Diagnosis Date  . A-fib (HCC)   . Anxiety   . Arthritis   . CHF (congestive heart failure) (HCC)   . Chronic back pain   . Constipation   . Constipation, chronic   . Depression   . Family history of adverse reaction to anesthesia    Mother, Sister, daughter- N/V  . History of blood transfusion    "pregnancy"  . HTN (hypertension)   . Insomnia   . Migraine headache   . Osteoporosis   . PONV (postoperative nausea and vomiting)    "violent"  . Pulmonary edema 06/2016  . Sodium (Na) deficiency   . Trigeminal neuralgia     Past Surgical History:  Procedure Laterality Date  . ANKLE FRACTURE SURGERY Right   . CARDIAC CATHETERIZATION     denies  . CATARACT EXTRACTION W/PHACO Right 07/22/2012   Procedure: CATARACT EXTRACTION PHACO AND INTRAOCULAR LENS PLACEMENT (IOC);  Surgeon: Gemma Payor, MD;  Location: [facility];  Service: Ophthalmology;  Laterality: Right;  CDE:  18.93  . CATARACT EXTRACTION W/PHACO Left 08/09/2012   Procedure: CATARACT EXTRACTION PHACO AND INTRAOCULAR LENS PLACEMENT (IOC);  Surgeon: Gemma Payor, MD;  Location: [facility];  Service: Ophthalmology;  Laterality: Left;  CDE: 17.21  . COLONOSCOPY    . HARDWARE REMOVAL  07/18/2011   Procedure: HARDWARE REMOVAL; Right leg-  Surgeon: Vickki Hearing, MD;  Location: [facility];  Service: Orthopedics;  Laterality: Right;  . KYPHOPLASTY N/A 12/26/2016   Procedure: Lumbar two Lumbar three and Lumbar five KYPHOPLASTY;  Surgeon: Maeola Harman, MD;  [facility];  Service: Neurosurgery;  Laterality: N/A;  . KYPHOPLASTY N/A 02/19/2017   Procedure: KYPHOPLASTY LUMBAR ONE;  Surgeon: Maeola Harman, MD;  Location: [facility] OR;  Service: Neurosurgery;  Laterality: N/A;  KYPHOPLASTY LUMBAR ONE  . NERVE REPAIR  07/18/2011   Procedure: NERVE REPAIR;  Surgeon: Vickki Hearing, MD;  Location: [facility];  Service: Orthopedics;  Laterality: Right;  Right leg superficial peroneal nerve release    . PARTIAL HYSTERECTOMY      Social History   Socioeconomic History  . Marital status: Widowed    Spouse name: Not on file  . Number of children: Not on file  . Years of education: Not on file  . Highest education level: Not on file  Social Needs  . Financial resource strain: Not on file  . Food insecurity - worry: Not on file  . Food insecurity - inability: Not on file  . Transportation needs - medical:  Not on file  . Transportation needs - non-medical: Not on file  Occupational History  . Occupation: retired    Associate Professor: RETIRED  Tobacco Use  . Smoking status: Never Smoker  . Smokeless tobacco: Never Used  Substance and Sexual Activity  . Alcohol use: No    Alcohol/week: 0.0 oz  . Drug use: No  . Sexual activity: Not Currently    Birth control/protection: Surgical    Comment: hyst  Other Topics Concern  . Not on file  Social History Narrative  . Not on file     Vitals:   04/21/17 1105  BP: 138/78  Pulse: 69  SpO2: 96%  Weight: 136 lb (61.7 kg)  Height: 5\' 2"  (1.575 m)    Wt Readings from Last 3 Encounters:  04/21/17 136 lb (61.7 kg)  04/09/17 136 lb (61.7 kg)  02/19/17 137 lb 0.6 oz (62.2 kg)     PHYSICAL EXAM General: NAD HEENT: Normal. Neck: No JVD, no thyromegaly. Lungs: Clear to auscultation bilaterally with normal respiratory effort. CV: Regular rate and rhythm, normal S1/S2, no S3/S4,  2/4 holodiastolic murmur along left sternal border. No pretibial or periankle edema.  No carotid bruit.   Abdomen: Soft, nontender, no distention.  Neurologic: Alert and oriented.  Psych: Normal affect. Skin: Normal. Musculoskeletal: No gross deformities.    ECG: Most  recent ECG reviewed.   Labs: Lab Results  Component Value Date/Time   K 3.7 02/19/2017 01:21 PM   BUN 14 02/19/2017 01:21 PM   BUN 14 07/18/2016 09:55 AM   CREATININE 0.85 02/19/2017 01:21 PM   CREATININE 0.90 10/18/2014 10:38 AM   ALT 16 06/30/2016 09:23 PM   TSH 1.845 12/06/2015 11:41 AM   TSH 2.200 09/24/2015 08:37 AM   HGB 13.2 12/26/2016 12:04 PM   HGB 13.2 09/24/2015 08:37 AM     Lipids: Lab Results  Component Value Date/Time   LDLCALC 93 09/24/2015 08:37 AM   CHOL 177 09/24/2015 08:37 AM   TRIG 133 09/24/2015 08:37 AM   HDL 57 09/24/2015 08:37 AM       ASSESSMENT AND PLAN: 1. Persistentatrial fibrillation: Symptomatically stable on metoprolol succinate. Continue Xarelto for systemic anticoagulation.Sheis in a regular rhythm today.  2. Malignanthypertension:Amlodipine led to leg swelling which has been stopped.  She is currently on losartan 50 mg twice daily and metoprolol succinate 25 mg every morning.  I will add low-dose chlorthalidone 12.5 mg daily and check a basic metabolic panel within a few days of initiation.  I have asked her to check her blood pressure 4 times per week for the next 3 weeks and will then have her follow-up.  If it remains elevated, chlorthalidone can be increased to a more optimal dose of 25 mg daily.  3. Anxiety:She has prnAtivan. She may require something on a daily basis. I will defer to PCP regarding this.  4.Chronic diastolic heart failure: Currently euvolemic. Takes Lasix as needed. I will aim to control BP.      Disposition: Follow up in 3 weeks   Prentice Docker, M.D., F.A.C.C.

## 2017-04-27 ENCOUNTER — Other Ambulatory Visit (HOSPITAL_COMMUNITY)
Admission: RE | Admit: 2017-04-27 | Discharge: 2017-04-27 | Disposition: A | Discharge status: Home / Self Care | Payer: Medicare Other | Source: Ambulatory Visit | Attending: Cardiovascular Disease | Admitting: Cardiovascular Disease

## 2017-04-27 ENCOUNTER — Telehealth: Payer: Self-pay

## 2017-04-27 DIAGNOSIS — E876 Hypokalemia: Secondary | ICD-10-CM

## 2017-04-27 DIAGNOSIS — Z79899 Other long term (current) drug therapy: Secondary | ICD-10-CM | POA: Insufficient documentation

## 2017-04-27 LAB — BASIC METABOLIC PANEL
Anion gap: 10 (ref 5–15)
BUN: 14 mg/dL (ref 6–20)
CO2: 30 mmol/L (ref 22–32)
Calcium: 9.5 mg/dL (ref 8.9–10.3)
Chloride: 93 mmol/L — ABNORMAL LOW (ref 101–111)
Creatinine, Ser: 0.85 mg/dL (ref 0.44–1.00)
GFR calc Af Amer: >60 mL/min (ref >60)
GFR calc non Af Amer: >60 mL/min (ref >60)
Glucose, Bld: 100 mg/dL — ABNORMAL HIGH (ref 65–99)
Potassium: 3.2 mmol/L — ABNORMAL LOW (ref 3.5–5.1)
Sodium: 133 mmol/L — ABNORMAL LOW (ref 135–145)

## 2017-04-27 MED ORDER — POTASSIUM CHLORIDE CRYS ER 20 MEQ PO TBCR: EXTENDED_RELEASE_TABLET | ORAL | 3 refills | Status: DC

## 2017-04-27 NOTE — Telephone Encounter (Signed)
-----   Message from Laqueta LindenSuresh A Koneswaran, MD sent at 04/27/2017 11:04 AM EDT ----- K is low. Start 20 meq KCl twice daily for 3 days, and then daily thereafter. Repeat BMET in one week.

## 2017-04-27 NOTE — Telephone Encounter (Signed)
Pt will take potassium 20 meq BID x 3 days and then daily therafter.Mailed lab slip for BMET in 1 week

## 2017-05-05 ENCOUNTER — Telehealth: Payer: Self-pay

## 2017-05-05 ENCOUNTER — Other Ambulatory Visit (HOSPITAL_COMMUNITY)
Admission: RE | Admit: 2017-05-05 | Discharge: 2017-05-05 | Disposition: A | Discharge status: Home / Self Care | Payer: Medicare Other | Source: Ambulatory Visit | Attending: Cardiovascular Disease | Admitting: Cardiovascular Disease

## 2017-05-05 DIAGNOSIS — E876 Hypokalemia: Secondary | ICD-10-CM | POA: Diagnosis present

## 2017-05-05 DIAGNOSIS — E871 Hypo-osmolality and hyponatremia: Secondary | ICD-10-CM

## 2017-05-05 LAB — BASIC METABOLIC PANEL
Anion gap: 13 (ref 5–15)
BUN: 16 mg/dL (ref 6–20)
CO2: 25 mmol/L (ref 22–32)
Calcium: 9.2 mg/dL (ref 8.9–10.3)
Chloride: 88 mmol/L — ABNORMAL LOW (ref 101–111)
Creatinine, Ser: 0.95 mg/dL (ref 0.44–1.00)
GFR calc Af Amer: >60 mL/min (ref >60)
GFR calc non Af Amer: 54 mL/min — ABNORMAL LOW (ref >60)
Glucose, Bld: 157 mg/dL — ABNORMAL HIGH (ref 65–99)
Potassium: 3.5 mmol/L (ref 3.5–5.1)
Sodium: 126 mmol/L — ABNORMAL LOW (ref 135–145)

## 2017-05-05 NOTE — Telephone Encounter (Signed)
Pt understands to Hold Chlorthalidone for 3 days and repeat BMET on Friday 3/22

## 2017-05-05 NOTE — Telephone Encounter (Signed)
-----   Message from Laqueta LindenSuresh A Koneswaran, MD sent at 05/05/2017 12:22 PM EDT ----- Sodium is decreasing.  Discontinue chlorthalidone and repeat basic metabolic panel in 3 days.  Ask her about urinary frequency after starting chlorthalidone.

## 2017-05-08 ENCOUNTER — Other Ambulatory Visit (HOSPITAL_COMMUNITY)
Admission: RE | Admit: 2017-05-08 | Discharge: 2017-05-08 | Disposition: A | Discharge status: Home / Self Care | Payer: Medicare Other | Source: Ambulatory Visit | Attending: Cardiovascular Disease | Admitting: Cardiovascular Disease

## 2017-05-08 DIAGNOSIS — E871 Hypo-osmolality and hyponatremia: Secondary | ICD-10-CM | POA: Diagnosis present

## 2017-05-08 LAB — BASIC METABOLIC PANEL
Anion gap: 12 (ref 5–15)
BUN: 10 mg/dL (ref 6–20)
CO2: 27 mmol/L (ref 22–32)
Calcium: 9.3 mg/dL (ref 8.9–10.3)
Chloride: 93 mmol/L — ABNORMAL LOW (ref 101–111)
Creatinine, Ser: 0.75 mg/dL (ref 0.44–1.00)
GFR calc Af Amer: >60 mL/min (ref >60)
GFR calc non Af Amer: >60 mL/min (ref >60)
Glucose, Bld: 87 mg/dL (ref 65–99)
Potassium: 3.6 mmol/L (ref 3.5–5.1)
Sodium: 132 mmol/L — ABNORMAL LOW (ref 135–145)

## 2017-05-11 ENCOUNTER — Telehealth: Payer: Self-pay

## 2017-05-11 NOTE — Telephone Encounter (Signed)
Patient has apt tomorrow with PA, needs copy of labs for Dr Venetia MaxonStern.She will stop Chlorthalidone and I removed Lasix from her med list as she is not on it

## 2017-05-11 NOTE — Telephone Encounter (Signed)
-----   Message from Laqueta LindenSuresh A Koneswaran, MD sent at 05/11/2017  9:37 AM EDT ----- Sodium is normalizing. DC chlorthalidone and avoid diuretics altogether. Have her monitor BP for another few weeks. If it remains elevated, I may try hydralazine.

## 2017-05-12 ENCOUNTER — Encounter: Payer: Self-pay | Admitting: Student

## 2017-05-12 ENCOUNTER — Ambulatory Visit (INDEPENDENT_AMBULATORY_CARE_PROVIDER_SITE_OTHER): Payer: Medicare Other | Admitting: Student

## 2017-05-12 VITALS — BP 150/84 | HR 82 | Ht 62.0 in | Wt 134.0 lb

## 2017-05-12 DIAGNOSIS — I5032 Chronic diastolic (congestive) heart failure: Secondary | ICD-10-CM | POA: Diagnosis not present

## 2017-05-12 DIAGNOSIS — Z7901 Long term (current) use of anticoagulants: Secondary | ICD-10-CM

## 2017-05-12 DIAGNOSIS — I481 Persistent atrial fibrillation: Secondary | ICD-10-CM

## 2017-05-12 DIAGNOSIS — I10 Essential (primary) hypertension: Secondary | ICD-10-CM

## 2017-05-12 DIAGNOSIS — I4819 Other persistent atrial fibrillation: Secondary | ICD-10-CM

## 2017-05-12 MED ORDER — METOPROLOL SUCCINATE ER 50 MG PO TB24: 50.0000 mg | ORAL_TABLET | Freq: Every day | ORAL | 3 refills | Status: DC

## 2017-05-12 NOTE — Patient Instructions (Signed)
Medication Instructions:  Your physician has recommended you make the following change in your medication:  Stop Taking Potassium  Increase Toprol XL to 50 mg Daily    Labwork: NONE   Testing/Procedures: NONE   Follow-Up: Your physician recommends that you schedule a follow-up appointment in: 6-8 Weeks    Any Other Special Instructions Will Be Listed Below (If Applicable). Your physician has requested that you regularly monitor and record your blood pressure readings at home. Please use the same machine at the same time of day to check your readings and record them to bring to your follow-up visit.    If you need a refill on your cardiac medications before your next appointment, please call your pharmacy.  Thank you for choosing [facility]!

## 2017-05-12 NOTE — Progress Notes (Signed)
Cardiology Office Note    Date:  05/12/2017   ID:  Katherine RicksBetty M Hendrix, DOB Apr 20, 1932, MRN [id number]  PCP:  Merlyn AlbertLuking, William S, MD  Cardiologist: Prentice DockerSuresh Koneswaran, MD    Chief Complaint  Patient presents with  . Follow-up    3 week visit    History of Present Illness:    Katherine Lane is a 84 y.o. female with past medical history of chronic diastolic CHF, PAF (on Xarelto), HTN, and chronic back pain (s/p recent kyphoplasty on 02/19/2017) who since to the office for 3-week follow-up.  She was recently examined by Dr. Purvis SheffieldKoneswaran on 04/21/2017 and reported feeling fatigued over the past few months.  She self discontinued amlodipine due to this causing worsening edema and was taking extra doses of Toprol-XL, sometimes up to 3 tablets at once. She was continued on Losartan 50mg  BID and Toprol-XL 25mg  daily with Chlorthalidone 12.5mg  daily being added to her medication regimen. Repeat labs were obtained on 04/27/2017 and showed Na+ had declined to 133 with K+ at 3.2 and creatinine at 0.85. She was started on K+ supplementation with repeat labs on 3/19 showing Na+ further down to 126, K+ at 3.5, and creatinine at 0.95. Chlorthalidone was discontinued at that time and repeat labs on 3/22 showed Na+ had improved to 132 with K+ at 3.6.  In talking with the patient today, she reports having fatigue and overall weakness since her last office visit.  She feels like her chronic back pain is likely playing a role in this as she is in persistent pain throughout the day. She stopped Chlorthalidone approximately 2 weeks ago and has noticed some improvement in her symptoms. She has continued to follow blood pressure at home and says SBP has been variable in the 130's-180's with diastolic readings in the 80's-90's.  She does report frequent palpitations but says heart rate has been in the 80's-90's when checked at home.  Denies any associated lightheadedness, dizziness, or presyncope. Does take an extra Toprol-XL  tablet at times and experiences improvement in her symptoms.   She remains on Xarelto for anticoagulation and denies any evidence of active bleeding.    Past Medical History:  Diagnosis Date  . A-fib (HCC)   . Anxiety   . Arthritis   . CHF (congestive heart failure) (HCC)   . Chronic back pain   . Constipation   . Constipation, chronic   . Depression   . Family history of adverse reaction to anesthesia    Mother, Sister, daughter- N/V  . History of blood transfusion    "pregnancy"  . HTN (hypertension)   . Insomnia   . Migraine headache   . Osteoporosis   . PONV (postoperative nausea and vomiting)    "violent"  . Pulmonary edema 06/2016  . Sodium (Na) deficiency   . Trigeminal neuralgia     Past Surgical History:  Procedure Laterality Date  . ANKLE FRACTURE SURGERY Right   . CARDIAC CATHETERIZATION     denies  . CATARACT EXTRACTION W/PHACO Right 07/22/2012   Procedure: CATARACT EXTRACTION PHACO AND INTRAOCULAR LENS PLACEMENT (IOC);  Surgeon: Gemma PayorKerry Hunt, MD;  Location: [facility];  Service: Ophthalmology;  Laterality: Right;  CDE:  18.93  . CATARACT EXTRACTION W/PHACO Left 08/09/2012   Procedure: CATARACT EXTRACTION PHACO AND INTRAOCULAR LENS PLACEMENT (IOC);  Surgeon: Gemma PayorKerry Hunt, MD;  Location: [facility];  Service: Ophthalmology;  Laterality: Left;  CDE: 17.21  . COLONOSCOPY    . HARDWARE REMOVAL  07/18/2011  Procedure: HARDWARE REMOVAL; Right leg-  Surgeon: Vickki Hearing, MD;  Location: [facility];  Service: Orthopedics;  Laterality: Right;  . KYPHOPLASTY N/A 12/26/2016   Procedure: Lumbar two Lumbar three and Lumbar five KYPHOPLASTY;  Surgeon: Maeola Harman, MD;  Location[facility];  Service: Neurosurgery;  Laterality: N/A;  . KYPHOPLASTY N/A 02/19/2017   Procedure: KYPHOPLASTY LUMBAR ONE;  Surgeon: Maeola Harman, MD;  Location: [facility] OR;  Service: Neurosurgery;  Laterality: N/A;  KYPHOPLASTY LUMBAR ONE  . NERVE REPAIR  07/18/2011   Procedure: NERVE REPAIR;  Surgeon: Vickki Hearing, MD;  Location: [facility];  Service: Orthopedics;  Laterality: Right;  Right leg superficial peroneal nerve release    . PARTIAL HYSTERECTOMY      Current Medications: Outpatient Medications Prior to Visit  Medication Sig Dispense Refill  . acetaminophen (TYLENOL) 500 MG tablet Take 1,000 mg by mouth every 4 (four) hours as needed for moderate pain or headache.     . alendronate (FOSAMAX) 70 MG tablet Take 1 tablet (70 mg total) by mouth every 7 (seven) days. Take with a full glass of water on an empty stomach. 12 tablet 3  . ANALPRAM-HC 1-1 % rectal cream APPLY TO AFFECTED AREAS TWICE DAILY. (Patient taking differently: APPLY TO AFFECTED AREAS TWICE DAILY AS NEEDED FOR HEMORRHOIDS) 28.4 g prn  . Carboxymethylcellul-Glycerin (LUBRICATING EYE DROPS OP) Place 1 drop into both eyes 3 (three) times daily.     . hydrocortisone-pramoxine (ANALPRAM-HC) 2.5-1 % rectal cream APPLY TO HEMORRHOIDS 4 TIMES DAILY AS NEEDED. (Patient taking differently: APPLY TO HEMORRHOIDS 4 TIMES DAILY AS NEEDED FOR HEMMORRHOIDS) 30 g 3  . HYDROmorphone (DILAUDID) 2 MG tablet Take 1 mg by mouth every 6 (six) hours as needed for moderate pain or severe pain.    Marland Kitchen LORazepam (ATIVAN) 0.5 MG tablet One up to BID (Patient taking differently: Take 0.5 mg by mouth See admin instructions. BID prn, Pt takes 1 in am prn and takes 1 every evening) 60 tablet 5  . ondansetron (ZOFRAN) 4 MG tablet Take 4 mg by mouth every 8 (eight) hours as needed for nausea or vomiting.    . polyethylene glycol powder (MIRALAX) powder Take 17 g daily as needed by mouth for moderate constipation.     . Pramoxine-HC (HYDROCORTISONE ACE-PRAMOXINE) 2.5-1 % CREA Apply to hemorrhoids QID prn (Patient taking differently: Place 1 application rectally 4 (four) times daily as needed (for hemorrhoids). ) 1 Tube 0  . XARELTO 20 MG TABS tablet TAKE 1 TABLET DAILY WITH SUPPER. 90 tablet 2  . zolpidem (AMBIEN) 10 MG tablet TAKE 1 TABLET BY MOUTH AT BEDTIME FOR  SLEEP. 30 tablet 5  . metoprolol succinate (TOPROL-XL) 25 MG 24 hr tablet Take 25 mg by mouth in the morning (Patient taking differently: Take 25 mg daily with lunch by mouth. ) 90 tablet 3  . potassium chloride SA (K-DUR,KLOR-CON) 20 MEQ tablet Take 20 meq twice a day for 3 days, THEN, take 20 meq daily thereafter 90 tablet 3  . losartan (COZAAR) 50 MG tablet Take 1 tablet (50 mg total) 2 (two) times daily by mouth. 180 tablet 3   No facility-administered medications prior to visit.      Allergies:   Amlodipine; Codeine; Other; and Tramadol   Social History   Socioeconomic History  . Marital status: Widowed    Spouse name: Not on file  . Number of children: Not on file  . Years of education: Not on file  . Highest  education level: Not on file  Occupational History  . Occupation: retired    Associate Professor: RETIRED  Social Needs  . Financial resource strain: Not on file  . Food insecurity:    Worry: Not on file    Inability: Not on file  . Transportation needs:    Medical: Not on file    Non-medical: Not on file  Tobacco Use  . Smoking status: Never Smoker  . Smokeless tobacco: Never Used  Substance and Sexual Activity  . Alcohol use: No    Alcohol/week: 0.0 oz  . Drug use: No  . Sexual activity: Not Currently    Birth control/protection: Surgical    Comment: hyst  Lifestyle  . Physical activity:    Days per week: Not on file    Minutes per session: Not on file  . Stress: Not on file  Relationships  . Social connections:    Talks on phone: Not on file    Gets together: Not on file    Attends religious service: Not on file    Active member of club or organization: Not on file    Attends meetings of clubs or organizations: Not on file    Relationship status: Not on file  Other Topics Concern  . Not on file  Social History Narrative  . Not on file     Family History:  The patient's family history includes Alzheimer's disease in her mother; Cancer in her mother;  Diabetes in her brother and daughter; Hypertension in her father and mother; Other in her brother and father.   Review of Systems:   Please see the history of present illness.     General:  No chills, fever, night sweats or weight changes. Positive for fatigue and chronic back pain.  Cardiovascular:  No chest pain, dyspnea on exertion, edema, orthopnea,  paroxysmal nocturnal dyspnea. Positive for palpitations.  Dermatological: No rash, lesions/masses Respiratory: No cough, dyspnea Urologic: No hematuria, dysuria Abdominal:   No nausea, vomiting, diarrhea, bright red blood per rectum, melena, or hematemesis Neurologic:  No visual changes, wkns, changes in mental status. All other systems reviewed and are otherwise negative except as noted above.   Physical Exam:    VS:  BP (!) 150/84   Pulse 82   Ht 5\' 2"  (1.575 m)   Wt 134 lb (60.8 kg)   SpO2 96%   BMI 24.51 kg/m    General: Well developed, well nourished Caucasian female appearing in no acute distress. Head: Normocephalic, atraumatic, sclera non-icteric, no xanthomas, nares are without discharge.  Neck: No carotid bruits. JVD not elevated.  Lungs: Respirations regular and unlabored, without wheezes or rales.  Heart: Irregularly irregular. No S3 or S4.  No murmur, no rubs, or gallops appreciated. Abdomen: Soft, non-tender, non-distended with normoactive bowel sounds. No hepatomegaly. No rebound/guarding. No obvious abdominal masses. Msk:  Strength and tone appear normal for age. No joint deformities or effusions. Extremities: No clubbing or cyanosis. No edema.  Distal pedal pulses are 2+ bilaterally. Neuro: Alert and oriented X 3. Moves all extremities spontaneously. No focal deficits noted. Psych:  Responds to questions appropriately with a normal affect. Skin: No rashes or lesions noted  Wt Readings from Last 3 Encounters:  05/12/17 134 lb (60.8 kg)  04/21/17 136 lb (61.7 kg)  04/09/17 136 lb (61.7 kg)     Studies/Labs  Reviewed:   EKG:  EKG is not ordered today.    Recent Labs: 06/30/2016: ALT 16 07/10/2016: B Natriuretic Peptide 253.3 07/25/2016:  Magnesium 2.2 12/26/2016: Hemoglobin 13.2; Platelets 313 05/08/2017: BUN 10; Creatinine, Ser 0.75; Potassium 3.6; Sodium 132   Lipid Panel    Component Value Date/Time   CHOL 177 09/24/2015 0837   TRIG 133 09/24/2015 0837   HDL 57 09/24/2015 0837   CHOLHDL 3.1 09/24/2015 0837   CHOLHDL 3.1 11/07/2013 0807   VLDL 26 11/07/2013 0807   LDLCALC 93 09/24/2015 0837    Additional studies/ records that were reviewed today include:   Echocardiogram: 06/2016 Study Conclusions  - Left ventricle: The cavity size was normal. Wall thickness was   increased in a pattern of mild LVH. Systolic function was   vigorous. The estimated ejection fraction was in the range of 65%   to 70%. Wall motion was normal; there were no regional wall   motion abnormalities. The study was not technically sufficient to   allow evaluation of LV diastolic dysfunction due to atrial   fibrillation. - Aortic valve: There was mild regurgitation. - Mitral valve: There was mild regurgitation. - Tricuspid valve: There was mild regurgitation.   Assessment:    1. Persistent atrial fibrillation (HCC)   2. Current use of long term anticoagulation   3. Essential hypertension   4. Chronic diastolic CHF (congestive heart failure) (HCC)      Plan:   In order of problems listed above:  1. Persistent Atrial Fibrillation/ Use of Long-Term Anticoagulation - She does note occasional palpitations which improves when she takes an additional Metoprolol tablet. It had been noted in previous office visits that she had fatigue when doing this but in talking with the patient today, she reports this overall helps with her symptoms. In the setting of her palpitations and elevated BP, will increase Toprol-XL to 50mg  daily.  - denies any evidence of active bleeding. Remains on Xarelto for  anticoagulation.  2. HTN - has remained on Losartan 50mg  BID and Toprol-XL 25mg  daily with Chlorthalidone 12.5mg  daily recently being added to her medication regimen. This was discontinued due to fluctuations in her kidney function and electrolytes. Since, BP has remained elevated when checked at home and is at 150/84 during today's visit.  - she was previously intolerant to Amlodipine secondary to edema. We reviewed starting Hydralazine as outlined in previous notes but she wishes to remain on her current medication regimen at this time. Is in agreement to further titrate Toprol-XL to 50mg  daily which can assist with palpitations as well. Can consider switching to Coreg in the future for additional BP effect if unable to tolerate Toprol dose adjustment. If BP still not at goal, would plan for initiation of Hydralazine as discussed with the patient today.   3. Chronic Diastolic CHF - She denies any recent dyspnea on exertion, orthopnea, PND, or lower extremity edema.  Appears euvolemic on examination today. - continue with conservative measures of sodium and fluid restriction. No longer on diuretic therapy given recent fluctuations in her kidney function and electrolytes.     Medication Adjustments/Labs and Tests Ordered: Current medicines are reviewed at length with the patient today.  Concerns regarding medicines are outlined above.  Medication changes, Labs and Tests ordered today are listed in the Patient Instructions below. Patient Instructions  Medication Instructions:  Your physician has recommended you make the following change in your medication:  Stop Taking Potassium  Increase Toprol XL to 50 mg Daily    Labwork: NONE   Testing/Procedures: NONE   Follow-Up: Your physician recommends that you schedule a follow-up appointment in: 6-8 Weeks  Any Other Special Instructions Will Be Listed Below (If Applicable). Your physician has requested that you regularly monitor and record  your blood pressure readings at home. Please use the same machine at the same time of day to check your readings and record them to bring to your follow-up visit.    If you need a refill on your cardiac medications before your next appointment, please call your pharmacy.  Thank you for choosing [facility]!     Signed, Ellsworth Lennox, PA-C  05/12/2017 6:20 PM    [facility] [address] [place], [place] [address] Phone: [phone]

## 2017-05-29 ENCOUNTER — Other Ambulatory Visit: Payer: Self-pay | Admitting: Family Medicine

## 2017-05-29 NOTE — Telephone Encounter (Signed)
Six mo worth 

## 2017-06-03 ENCOUNTER — Telehealth: Payer: Self-pay

## 2017-06-03 MED ORDER — HYDRALAZINE HCL 25 MG PO TABS: 25.0000 mg | ORAL_TABLET | Freq: Two times a day (BID) | ORAL | 6 refills | Status: DC

## 2017-06-03 NOTE — Telephone Encounter (Signed)
BP log reviewed by Dr.Koneswaran, to be scanned.Start Hydralazine 25 mg BID, pt aware

## 2017-06-04 ENCOUNTER — Emergency Department (HOSPITAL_COMMUNITY): Payer: Medicare Other

## 2017-06-04 ENCOUNTER — Encounter (HOSPITAL_COMMUNITY): Payer: Self-pay

## 2017-06-04 ENCOUNTER — Emergency Department (HOSPITAL_COMMUNITY)
Admission: EM | Admit: 2017-06-04 | Discharge: 2017-06-04 | Disposition: A | Discharge status: Home / Self Care | Payer: Medicare Other | Attending: Emergency Medicine | Admitting: Emergency Medicine

## 2017-06-04 ENCOUNTER — Other Ambulatory Visit: Payer: Self-pay

## 2017-06-04 DIAGNOSIS — I110 Hypertensive heart disease with heart failure: Secondary | ICD-10-CM | POA: Diagnosis not present

## 2017-06-04 DIAGNOSIS — Y999 Unspecified external cause status: Secondary | ICD-10-CM | POA: Insufficient documentation

## 2017-06-04 DIAGNOSIS — I5032 Chronic diastolic (congestive) heart failure: Secondary | ICD-10-CM | POA: Diagnosis not present

## 2017-06-04 DIAGNOSIS — M79671 Pain in right foot: Secondary | ICD-10-CM | POA: Diagnosis not present

## 2017-06-04 DIAGNOSIS — Y929 Unspecified place or not applicable: Secondary | ICD-10-CM | POA: Diagnosis not present

## 2017-06-04 DIAGNOSIS — W010XXA Fall on same level from slipping, tripping and stumbling without subsequent striking against object, initial encounter: Secondary | ICD-10-CM | POA: Diagnosis not present

## 2017-06-04 DIAGNOSIS — Z79899 Other long term (current) drug therapy: Secondary | ICD-10-CM | POA: Insufficient documentation

## 2017-06-04 DIAGNOSIS — Y9301 Activity, walking, marching and hiking: Secondary | ICD-10-CM | POA: Diagnosis not present

## 2017-06-04 DIAGNOSIS — S99911A Unspecified injury of right ankle, initial encounter: Secondary | ICD-10-CM | POA: Diagnosis present

## 2017-06-04 DIAGNOSIS — S93401A Sprain of unspecified ligament of right ankle, initial encounter: Secondary | ICD-10-CM | POA: Diagnosis not present

## 2017-06-04 NOTE — ED Triage Notes (Signed)
Pt arrives via POV from home. Pt lives alone. Pt states she fell/ tripped over her walker getting out of bed this morning.

## 2017-06-04 NOTE — ED Provider Notes (Signed)
[facility] EMERGENCY DEPARTMENT Provider Note   CSN: [id number] Arrival date & time: 06/04/17  4098     History   Chief Complaint Chief Complaint  Patient presents with  . Fall    HPI Katherine Lane is a 84 y.o. female.  Patient presents to the emergency department for evaluation of right foot and ankle injury.  Patient reports that she was using her walker this morning when her feet got tangled up in the walker and she tripped and fell.  Patient reports that she twisted her right ankle and foot area, has moderate to severe pain that worsens with trying to bear weight.  She did not hit her head, no neck or back pain.  Patient reports previous injury to the ankle with surgical repair.     Past Medical History:  Diagnosis Date  . A-fib (HCC)   . Anxiety   . Arthritis   . CHF (congestive heart failure) (HCC)   . Chronic back pain   . Constipation   . Constipation, chronic   . Depression   . Family history of adverse reaction to anesthesia    Mother, Sister, daughter- N/V  . History of blood transfusion    "pregnancy"  . HTN (hypertension)   . Insomnia   . Migraine headache   . Osteoporosis   . PONV (postoperative nausea and vomiting)    "violent"  . Pulmonary edema 06/2016  . Sodium (Na) deficiency   . Trigeminal neuralgia     Patient Active Problem List   Diagnosis Date Noted  . Compression fracture of L1 lumbar vertebra (HCC) 02/19/2017  . Lumbar compression fracture (HCC) 12/26/2016  . Gastroesophageal reflux disease without esophagitis 07/18/2016  . Acute on chronic diastolic CHF (congestive heart failure) (HCC) 07/11/2016  . Chronic coughing 07/11/2016  . Pulmonary edema 07/10/2016  . Migraine headache 07/01/2016  . Blurred vision, bilateral 07/01/2016  . Anxiety 06/30/2016  . Altered mental status 12/06/2015  . Hyponatremia 12/06/2015  . Hypokalemia 12/06/2015  . Dehydration 12/06/2015  . Prolonged Q-T interval on ECG 12/06/2015  . Postural  dizziness with presyncope 12/06/2015  . Atrial fibrillation (HCC) 07/14/2013  . Atrial flutter (HCC) 05/12/2013  . Bulging discs 04/20/2013  . Trigeminal neuralgia 01/15/2013  . Cellulitis 07/31/2011  . Mechanical complication of internal orthopedic implant (HCC) 07/21/2011  . 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 Ankle fracture 07/02/2010  . LESION OF PLANTAR NERVE 05/14/2009  . CLOSED BIMALLEOLAR FRACTURE 03/01/2009  . Essential hypertension 11/07/2008  . CONSTIPATION, CHRONIC 11/07/2008  . HEMATOCHEZIA 11/07/2008  . Osteoporosis 11/07/2008    Past Surgical History:  Procedure Laterality Date  . ANKLE FRACTURE SURGERY Right   . CARDIAC CATHETERIZATION     denies  . CATARACT EXTRACTION W/PHACO Right 07/22/2012   Procedure: CATARACT EXTRACTION PHACO AND INTRAOCULAR LENS PLACEMENT (IOC);  Surgeon: Gemma Payor, MD;  Location: [facility];  Service: Ophthalmology;  Laterality: Right;  CDE:  18.93  . CATARACT EXTRACTION W/PHACO Left 08/09/2012   Procedure: CATARACT EXTRACTION PHACO AND INTRAOCULAR LENS PLACEMENT (IOC);  Surgeon: Gemma Payor, MD;  Location: [facility];  Service: Ophthalmology;  Laterality: Left;  CDE: 17.21  . COLONOSCOPY    . HARDWARE REMOVAL  07/18/2011   Procedure: HARDWARE REMOVAL; Right leg-  Surgeon: Vickki Hearing, MD;  Location: [facility];  Service: Orthopedics;  Laterality: Right;  . KYPHOPLASTY N/A 12/26/2016   Procedure: Lumbar two Lumbar three and Lumbar five KYPHOPLASTY;  Surgeon: Maeola Harman, MD;  Location: [facility] OR;  Service: Neurosurgery;  Laterality: N/A;  . KYPHOPLASTY N/A 02/19/2017   Procedure: KYPHOPLASTY LUMBAR ONE;  Surgeon: Maeola HarmanStern, Joseph, MD;  [facility];  Service: Neurosurgery;  Laterality: N/A;  KYPHOPLASTY LUMBAR ONE  . NERVE REPAIR  07/18/2011   Procedure: NERVE REPAIR;  Surgeon: Vickki HearingStanley E Harrison, MD;  Location: [facility];  Service: Orthopedics;  Laterality: Right;  Right leg superficial peroneal nerve release    . PARTIAL HYSTERECTOMY        OB History    Gravida  3   Para  3   Term  3   Preterm      AB      Living  3     SAB      TAB      Ectopic      Multiple      Live Births  3            Home Medications    Prior to Admission medications   Medication Sig Start Date End Date Taking? Authorizing Provider  acetaminophen (TYLENOL) 500 MG tablet Take 1,000 mg by mouth every 4 (four) hours as needed for moderate pain or headache.    Yes [provider]  hydrocortisone-pramoxine (ANALPRAM-HC) 2.5-1 % rectal cream APPLY TO HEMORRHOIDS 4 TIMES DAILY AS NEEDED. Patient taking differently: APPLY TO HEMORRHOIDS 4 TIMES DAILY AS NEEDED FOR HEMMORRHOIDS 01/23/17  Yes Luking, Jonna CoupScott A, MD  HYDROmorphone (DILAUDID) 2 MG tablet Take 1 mg by mouth every 6 (six) hours as needed for moderate pain or severe pain.   Yes [provider]  LORazepam (ATIVAN) 0.5 MG tablet One up to BID Patient taking differently: Take 0.5 mg by mouth See admin instructions. BID prn, Pt takes 1 in am prn and takes 1 every evening 02/12/17  Yes Merlyn AlbertLuking, William S, MD  metoprolol succinate (TOPROL-XL) 50 MG 24 hr tablet Take 1 tablet (50 mg total) by mouth daily. Take with or immediately following a meal. 05/12/17 08/10/17 Yes Strader, Lennart PallBrittany M, PA-C  XARELTO 20 MG TABS tablet TAKE 1 TABLET DAILY WITH SUPPER. 03/26/17  Yes Laqueta LindenKoneswaran, Suresh A, MD  zolpidem (AMBIEN) 10 MG tablet TAKE 1 TABLET BY MOUTH AT BEDTIME FOR SLEEP. 05/29/17  Yes Merlyn AlbertLuking, William S, MD  alendronate (FOSAMAX) 70 MG tablet Take 1 tablet (70 mg total) by mouth every 7 (seven) days. Take with a full glass of water on an empty stomach. 02/12/17   Merlyn AlbertLuking, William S, MD  [facility] % rectal cream APPLY TO AFFECTED AREAS TWICE DAILY. Patient taking differently: APPLY TO AFFECTED AREAS TWICE DAILY AS NEEDED FOR HEMORRHOIDS 07/01/16   Adline PotterGriffin, Jennifer A, NP  Carboxymethylcellul-Glycerin (LUBRICATING EYE DROPS OP) Place 1 drop into both eyes 3 (three) times  daily.     [provider]  hydrALAZINE (APRESOLINE) 25 MG tablet Take 1 tablet (25 mg total) by mouth 2 (two) times daily. 06/03/17 09/01/17  Laqueta LindenKoneswaran, Suresh A, MD  losartan (COZAAR) 50 MG tablet Take 1 tablet (50 mg total) 2 (two) times daily by mouth. 01/05/17 04/05/17  Laqueta LindenKoneswaran, Suresh A, MD  ondansetron (ZOFRAN) 4 MG tablet Take 4 mg by mouth every 8 (eight) hours as needed for nausea or vomiting.    [provider]  polyethylene glycol powder (MIRALAX) powder Take 17 g daily as needed by mouth for moderate constipation.     [provider]  Pramoxine-HC (HYDROCORTISONE ACE-PRAMOXINE) 2.5-1 % CREA Apply to hemorrhoids QID prn Patient taking differently: Place 1 application rectally 4 (  four) times daily as needed (for hemorrhoids).  01/07/17   Campbell Riches, NP    Family History Family History  Problem Relation Age of Onset  . Hypertension Mother   . Alzheimer's disease Mother   . Cancer Mother   . Hypertension Father   . Other Father        abused pain meds  . Diabetes Brother   . Other Brother        back problems  . Diabetes Daughter   . Anesthesia problems Neg Hx   . Hypotension Neg Hx   . Malignant hyperthermia Neg Hx   . Pseudochol deficiency Neg Hx     Social History Social History   Tobacco Use  . Smoking status: Never Smoker  . Smokeless tobacco: Never Used  Substance Use Topics  . Alcohol use: No    Alcohol/week: 0.0 oz  . Drug use: No     Allergies   Amlodipine; Codeine; Other; and Tramadol   Review of Systems Review of Systems  Musculoskeletal: Positive for arthralgias. Negative for neck pain.  Neurological: Negative for syncope.     Physical Exam Updated Vital Signs BP (!) 148/76   Pulse 69   Temp 97.8 F (36.6 C) (Oral)   Resp 18   Ht 5\' 3"  (1.6 m)   Wt 61.7 kg (136 lb)   SpO2 94%   BMI 24.09 kg/m   Physical Exam  Constitutional: She appears well-developed.  HENT:  Head: Normocephalic and  atraumatic.  Neck: Normal range of motion. Neck supple.  Cardiovascular: Regular rhythm.  Pulmonary/Chest: Effort normal and breath sounds normal.  Musculoskeletal:       Right ankle: She exhibits swelling. She exhibits no deformity. Tenderness. Lateral malleolus tenderness found. No proximal fibula tenderness found.       Right foot: There is tenderness. There is no deformity.     ED Treatments / Results  Labs (all labs ordered are listed, but only abnormal results are displayed) Labs Reviewed - No data to display  EKG None  Radiology Dg Ankle Complete Right  Result Date: 06/04/2017 CLINICAL DATA:  Status post fall, with right ankle pain. Initial encounter. EXAM: RIGHT ANKLE - COMPLETE 3+ VIEW COMPARISON:  None. FINDINGS: There is no evidence of fracture or dislocation. Medial malleolar hardware appears grossly intact. The ankle mortise is intact; the interosseous space is within normal limits. No talar tilt or subluxation is seen. The joint spaces are preserved. A large ankle joint effusion is noted. IMPRESSION: 1. No evidence of fracture or dislocation. Hardware appears grossly intact. 2. Large ankle joint effusion noted. Electronically Signed   By: Roanna Raider M.D.   On: 06/04/2017 07:03   Dg Foot Complete Right  Result Date: 06/04/2017 CLINICAL DATA:  Status post fall, with dorsal right foot pain. Initial encounter. EXAM: RIGHT FOOT COMPLETE - 3+ VIEW COMPARISON:  Right ankle radiographs performed 02/15/2009 FINDINGS: There is no evidence of acute fracture or dislocation. Pins at the medial malleolus appear grossly intact. The joint spaces are preserved. There is no evidence of talar subluxation; the subtalar joint is unremarkable in appearance. An ankle joint effusion is noted. IMPRESSION: 1. No evidence of acute fracture or dislocation. Medial malleolar hardware appears intact. 2. Ankle joint effusion noted. Electronically Signed   By: Roanna Raider M.D.   On: 06/04/2017 07:02     Procedures Procedures (including critical care time)  Medications Ordered in ED Medications - No data to display   Initial Impression /  Assessment and Plan / ED Course  I have reviewed the triage vital signs and the nursing notes.  Pertinent labs & imaging results that were available during my care of the patient were reviewed by me and considered in my medical decision making (see chart for details).     Patient presents with isolated right foot and ankle pain after a fall this morning.  She had a mechanical fall when she tripped over her walker.  She is not on blood thinners.  No head injury.  No spinal injury.  X-ray of ankle and foot do not show any evidence of fracture.  Final Clinical Impressions(s) / ED Diagnoses   Final diagnoses:  Sprain of right ankle, unspecified ligament, initial encounter    ED Discharge Orders    None       Christinia Lambeth, Canary Brim, MD 06/04/17 [phone]

## 2017-06-04 NOTE — ED Notes (Signed)
Patient given ice pak at this time 

## 2017-06-04 NOTE — ED Notes (Signed)
Patient transported to X-ray 

## 2017-06-04 NOTE — ED Notes (Signed)
Pt returned from X Ray.

## 2017-06-25 ENCOUNTER — Ambulatory Visit: Payer: Medicare Other | Admitting: Cardiovascular Disease

## 2017-06-25 ENCOUNTER — Encounter: Payer: Self-pay | Admitting: Cardiovascular Disease

## 2017-06-25 VITALS — BP 160/80 | HR 79 | Ht 62.0 in | Wt 139.0 lb

## 2017-06-25 DIAGNOSIS — I5032 Chronic diastolic (congestive) heart failure: Secondary | ICD-10-CM | POA: Diagnosis not present

## 2017-06-25 DIAGNOSIS — I10 Essential (primary) hypertension: Secondary | ICD-10-CM | POA: Diagnosis not present

## 2017-06-25 DIAGNOSIS — Z7901 Long term (current) use of anticoagulants: Secondary | ICD-10-CM

## 2017-06-25 DIAGNOSIS — I481 Persistent atrial fibrillation: Secondary | ICD-10-CM

## 2017-06-25 DIAGNOSIS — I4819 Other persistent atrial fibrillation: Secondary | ICD-10-CM

## 2017-06-25 MED ORDER — METOPROLOL SUCCINATE ER 25 MG PO TB24: 25.0000 mg | ORAL_TABLET | Freq: Every day | ORAL | 3 refills | Status: DC

## 2017-06-25 MED ORDER — RIVAROXABAN 20 MG PO TABS: ORAL_TABLET | ORAL | 2 refills | Status: DC

## 2017-06-25 NOTE — Addendum Note (Signed)
Addended by: Marlyn Corporal A on: 06/25/2017 02:33 PM   Modules accepted: Orders

## 2017-06-25 NOTE — Progress Notes (Signed)
SUBJECTIVE: The patient presents for follow-up of accelerated hypertension.  I started hydralazine 25 mg twice daily in mid April.  Toprol-XL was increased to 50 mg daily on 05/12/2017.  She is feeling well today but had palpitations and lightheadedness with a heart rate of about 115 bpm this past Sunday.  She said her blood pressure fluctuates quite a bit at home.  When it is normal and in the 113/80 range, she does not feel well because she is not used to such blood pressures.  She had gained 4 pounds between last Thursday and Saturday and took Lasix.   Review of Systems: As per "subjective", otherwise negative.  Allergies  Allergen Reactions  . Amlodipine Swelling    Leg swelling   . Codeine Nausea And Vomiting and Other (See Comments)    Patient states "intolerance to all pain medications"  (NO OPIOIDS) VERY VIOLENT VOMITING!!  . Other Nausea And Vomiting and Other (See Comments)    general anesthesia  . Tramadol Nausea And Vomiting and Other (See Comments)    "NO OPIOIDS!!! VERY VIOLENT VOMITING!!" per Med History prior to 02/18/17    Current Outpatient Medications  Medication Sig Dispense Refill  . ANALPRAM-HC 1-1 % rectal cream APPLY TO AFFECTED AREAS TWICE DAILY. (Patient taking differently: APPLY TO AFFECTED AREAS TWICE DAILY AS NEEDED FOR HEMORRHOIDS) 28.4 g prn  . Carboxymethylcellul-Glycerin (LUBRICATING EYE DROPS OP) Place 1 drop into both eyes 3 (three) times daily.     . hydrALAZINE (APRESOLINE) 25 MG tablet Take 1 tablet (25 mg total) by mouth 2 (two) times daily. 60 tablet 6  . LORazepam (ATIVAN) 0.5 MG tablet One up to BID (Patient taking differently: Take 0.5 mg by mouth See admin instructions. BID prn, Pt takes 1 in am prn and takes 1 every evening) 60 tablet 5  . losartan (COZAAR) 50 MG tablet Take 1 tablet (50 mg total) 2 (two) times daily by mouth. 180 tablet 3  . metoprolol succinate (TOPROL-XL) 50 MG 24 hr tablet Take 1 tablet (50 mg total) by mouth  daily. Take with or immediately following a meal. 90 tablet 3  . polyethylene glycol powder (MIRALAX) powder Take 17 g daily as needed by mouth for moderate constipation.     Carlena Hurl 20 MG TABS tablet TAKE 1 TABLET DAILY WITH SUPPER. 90 tablet 2  . zolpidem (AMBIEN) 10 MG tablet TAKE 1 TABLET BY MOUTH AT BEDTIME FOR SLEEP. 30 tablet 5   No current facility-administered medications for this visit.     Past Medical History:  Diagnosis Date  . A-fib (HCC)   . Anxiety   . Arthritis   . CHF (congestive heart failure) (HCC)   . Chronic back pain   . Constipation   . Constipation, chronic   . Depression   . Family history of adverse reaction to anesthesia    Mother, Sister, daughter- N/V  . History of blood transfusion    "pregnancy"  . HTN (hypertension)   . Insomnia   . Migraine headache   . Osteoporosis   . PONV (postoperative nausea and vomiting)    "violent"  . Pulmonary edema 06/2016  . Sodium (Na) deficiency   . Trigeminal neuralgia     Past Surgical History:  Procedure Laterality Date  . ANKLE FRACTURE SURGERY Right   . CARDIAC CATHETERIZATION     denies  . CATARACT EXTRACTION W/PHACO Right 07/22/2012   Procedure: CATARACT EXTRACTION PHACO AND INTRAOCULAR LENS PLACEMENT (IOC);  Surgeon:  Gemma Payor, MD;  Location: [facility];  Service: Ophthalmology;  Laterality: Right;  CDE:  18.93  . CATARACT EXTRACTION W/PHACO Left 08/09/2012   Procedure: CATARACT EXTRACTION PHACO AND INTRAOCULAR LENS PLACEMENT (IOC);  Surgeon: Gemma Payor, MD;  Location: [facility];  Service: Ophthalmology;  Laterality: Left;  CDE: 17.21  . COLONOSCOPY    . HARDWARE REMOVAL  07/18/2011   Procedure: HARDWARE REMOVAL; Right leg-  Surgeon: Vickki Hearing, MD;  Location: [facility];  Service: Orthopedics;  Laterality: Right;  . KYPHOPLASTY N/A 12/26/2016   Procedure: Lumbar two Lumbar three and Lumbar five KYPHOPLASTY;  Surgeon: Maeola Harman, MD;  [facility];  Service: Neurosurgery;  Laterality: N/A;  .  KYPHOPLASTY N/A 02/19/2017   Procedure: KYPHOPLASTY LUMBAR ONE;  Surgeon: Maeola Harman, MD;  Location[facility];  Service: Neurosurgery;  Laterality: N/A;  KYPHOPLASTY LUMBAR ONE  . NERVE REPAIR  07/18/2011   Procedure: NERVE REPAIR;  Surgeon: Vickki Hearing, MD;  Location: [facility];  Service: Orthopedics;  Laterality: Right;  Right leg superficial peroneal nerve release    . PARTIAL HYSTERECTOMY      Social History   Socioeconomic History  . Marital status: Widowed    Spouse name: Not on file  . Number of children: Not on file  . Years of education: Not on file  . Highest education level: Not on file  Occupational History  . Occupation: retired    Associate Professor: RETIRED  Social Needs  . Financial resource strain: Not on file  . Food insecurity:    Worry: Not on file    Inability: Not on file  . Transportation needs:    Medical: Not on file    Non-medical: Not on file  Tobacco Use  . Smoking status: Never Smoker  . Smokeless tobacco: Never Used  Substance and Sexual Activity  . Alcohol use: No    Alcohol/week: 0.0 oz  . Drug use: No  . Sexual activity: Not Currently    Birth control/protection: Surgical    Comment: hyst  Lifestyle  . Physical activity:    Days per week: Not on file    Minutes per session: Not on file  . Stress: Not on file  Relationships  . Social connections:    Talks on phone: Not on file    Gets together: Not on file    Attends religious service: Not on file    Active member of club or organization: Not on file    Attends meetings of clubs or organizations: Not on file    Relationship status: Not on file  . Intimate partner violence:    Fear of current or ex partner: Not on file    Emotionally abused: Not on file    Physically abused: Not on file    Forced sexual activity: Not on file  Other Topics Concern  . Not on file  Social History Narrative  . Not on file     Vitals:   06/25/17 1339  BP: (!) 160/80  Pulse: 79  SpO2: 97%  Weight: 139  lb (63 kg)  Height:  (1.575 m)    Wt Readings from Last 3 Encounters:  06/25/17 139 lb (63 kg)  06/04/17 136 lb (61.7 kg)  05/12/17 134 lb (60.8 kg)     PHYSICAL EXAM General: NAD HEENT: Normal. Neck: No JVD, no thyromegaly. Lungs: Clear to auscultation bilaterally with normal respiratory effort. CV: Regular rate and rhythm, normal S1/S2, no S3/S4, no murmur. No pretibial or  periankle edema.  No carotid bruit.   Abdomen: Soft, nontender, no distention.  Neurologic: Alert and oriented.  Psych: Normal affect. Skin: Normal. Musculoskeletal: No gross deformities.    ECG: Most recent ECG reviewed.   Labs: Lab Results  Component Value Date/Time   K 3.6 05/08/2017 09:39 AM   BUN 10 05/08/2017 09:39 AM   BUN 14 07/18/2016 09:55 AM   CREATININE 0.75 05/08/2017 09:39 AM   CREATININE 0.90 10/18/2014 10:38 AM   ALT 16 06/30/2016 09:23 PM   TSH 1.845 12/06/2015 11:41 AM   TSH 2.200 09/24/2015 08:37 AM   HGB 13.2 12/26/2016 12:04 PM   HGB 13.2 09/24/2015 08:37 AM     Lipids: Lab Results  Component Value Date/Time   LDLCALC 93 09/24/2015 08:37 AM   CHOL 177 09/24/2015 08:37 AM   TRIG 133 09/24/2015 08:37 AM   HDL 57 09/24/2015 08:37 AM       ASSESSMENT AND PLAN: 1.  Persistent atrial fibrillation: Currently in a regular rhythm.  I will increase Toprol-XL to 75 mg every morning in an attempt to further suppress paroxysms of atrial fibrillation.  Systemically anticoagulated with Xarelto.  2.  Accelerated hypertension: Blood pressure remains elevated but tends to fluctuate quite a bit.  I recently started hydralazine 25 mg twice daily.  She is currently on losartan 50 mg twice daily and Toprol-XL 50 mg daily.  She was intolerant to amlodipine in the past. I will increase Toprol-XL to 75 mg every morning as noted above to suppress paroxysms of atrial fibrillation, which may also help to regulate blood pressure.  3.  Chronic diastolic heart failure: Diuretic therapy has  led to hyponatremia in the past.  Continue conservative measures including sodium and fluid restriction.  She takes Lasix as needed.    Disposition: Follow up 4 months   Prentice Docker, M.D., F.A.C.C.

## 2017-06-25 NOTE — Patient Instructions (Addendum)
Your physician wants you to follow-up in:4 months with Dr.Koneswaran     INCREASE Toprol XL to 75 mg (take 1 - 50 mg tablet and 1- 25 mg tablet)     No other medication changes     No lab work or tests ordered today.     Thank you for choosing [facility] !

## 2017-08-05 ENCOUNTER — Ambulatory Visit (INDEPENDENT_AMBULATORY_CARE_PROVIDER_SITE_OTHER): Payer: Medicare Other

## 2017-08-05 ENCOUNTER — Ambulatory Visit: Payer: Medicare Other | Admitting: Orthopedic Surgery

## 2017-08-05 VITALS — BP 130/77 | HR 82 | Ht 62.0 in | Wt 139.0 lb

## 2017-08-05 DIAGNOSIS — M25551 Pain in right hip: Secondary | ICD-10-CM | POA: Diagnosis not present

## 2017-08-05 DIAGNOSIS — M5410 Radiculopathy, site unspecified: Secondary | ICD-10-CM | POA: Diagnosis not present

## 2017-08-05 MED ORDER — GABAPENTIN 400 MG PO CAPS: 400.0000 mg | ORAL_CAPSULE | Freq: Three times a day (TID) | ORAL | 0 refills | Status: DC

## 2017-08-05 NOTE — Progress Notes (Signed)
Katherine Lane  08/05/2017  HISTORY SECTION :  Chief Complaint  Patient presents with  . Hip Pain    Right hip pain, no injury.   84 year old female came in for evaluation of her right ankle.  She had right ankle surgery open treatment internal fixation bimalleolar fracture but had the lateral plates removed.  She fell and injured her ankle was having swelling had an x-ray at the hospital x-rays were negative however, she woke up this morning severe right hip pain radiating down her right leg trouble walking.  She is on Xarelto  She has a history of vertebroplasty via Dr. Venetia Maxon and sees him again in July  She denies numbness but has burning pain in the right leg.  Bowel and bladder function remain intact   Review of Systems  Constitutional: Negative for fever, malaise/fatigue and weight loss.    Past Medical History:  Diagnosis Date  . A-fib (HCC)   . Anxiety   . Arthritis   . CHF (congestive heart failure) (HCC)   . Chronic back pain   . Constipation   . Constipation, chronic   . Depression   . Family history of adverse reaction to anesthesia    Mother, Sister, daughter- N/V  . History of blood transfusion    "pregnancy"  . HTN (hypertension)   . Insomnia   . Migraine headache   . Osteoporosis   . PONV (postoperative nausea and vomiting)    "violent"  . Pulmonary edema 06/2016  . Sodium (Na) deficiency   . Trigeminal neuralgia     Allergies  Allergen Reactions  . Amlodipine Swelling    Leg swelling   . Codeine Nausea And Vomiting and Other (See Comments)    Patient states "intolerance to all pain medications"  (NO OPIOIDS) VERY VIOLENT VOMITING!!  . Other Nausea And Vomiting and Other (See Comments)    general anesthesia  . Tramadol Nausea And Vomiting and Other (See Comments)    "NO OPIOIDS!!! VERY VIOLENT VOMITING!!" per Med History prior to 02/18/17     Current Outpatient Medications:  .  ANALPRAM-HC 1-1 % rectal cream, APPLY TO AFFECTED AREAS TWICE  DAILY. (Patient taking differently: APPLY TO AFFECTED AREAS TWICE DAILY AS NEEDED FOR HEMORRHOIDS), Disp: 28.4 g, Rfl: prn .  Carboxymethylcellul-Glycerin (LUBRICATING EYE DROPS OP), Place 1 drop into both eyes 3 (three) times daily. , Disp: , Rfl:  .  hydrALAZINE (APRESOLINE) 25 MG tablet, Take 1 tablet (25 mg total) by mouth 2 (two) times daily., Disp: 60 tablet, Rfl: 6 .  LORazepam (ATIVAN) 0.5 MG tablet, One up to BID (Patient taking differently: Take 0.5 mg by mouth See admin instructions. BID prn, Pt takes 1 in am prn and takes 1 every evening), Disp: 60 tablet, Rfl: 5 .  losartan (COZAAR) 50 MG tablet, Take 1 tablet (50 mg total) 2 (two) times daily by mouth., Disp: 180 tablet, Rfl: 3 .  metoprolol succinate (TOPROL XL) 25 MG 24 hr tablet, Take 1 tablet (25 mg total) by mouth daily. Take in addition to the 50 mg tablet daily for a total of 75 mg daily, Disp: 90 tablet, Rfl: 3 .  metoprolol succinate (TOPROL-XL) 50 MG 24 hr tablet, Take 1 tablet (50 mg total) by mouth daily. Take with or immediately following a meal., Disp: 90 tablet, Rfl: 3 .  polyethylene glycol powder (MIRALAX) powder, Take 17 g daily as needed by mouth for moderate constipation. , Disp: , Rfl:  .  rivaroxaban (XARELTO) 20  MG TABS tablet, TAKE 1 TABLET DAILY WITH SUPPER., Disp: 90 tablet, Rfl: 2 .  zolpidem (AMBIEN) 10 MG tablet, TAKE 1 TABLET BY MOUTH AT BEDTIME FOR SLEEP., Disp: 30 tablet, Rfl: 5   PHYSICAL EXAM SECTION: BP 130/77   Pulse 82   Ht 5\' 2"  (1.575 m)   Wt 139 lb (63 kg)   BMI 25.42 kg/m   General appearance the patient is normally developed grooming and hygiene are normal  Oriented x3  Mood pleasant affect normal  Gait no disturbances  Extremity #1 right hip and right leg Inspection swelling and tenderness over the right greater trochanteric bursa Range of motion full Stability tests were normal Strength is 5/5 Skin was warm dry and intact without rash lesion or ulceration Pulse and perfusion  normal without edema Normal sensation  Extremity #2 left hip Inspection no swelling or tenderness Range of motion full Stability tests were normal Strength 5 out of 5 to manual muscle test Skin warm dry and intact without rash lesions or ulceration  Lower back pain and tenderness to palpation  MEDICAL DECISION SECTION:  Encounter Diagnosis  Name Primary?  . Pain of right hip joint Yes    Imaging [facility] pelvis shows no fracture dislocation in the hip she does have degenerative disc disease and lumbar scoliosis on x-ray  Plan:   The patient is on Xarelto she is allergic to all opioids and steroids increase her blood pressure   (Rx., Inj., surg., Frx, MRI/CT, XR:2) Inject right greater trochanteric bursa Increase gabapentin to 400 mg 3 times a day   Procedure note injection for   right hip bursitis  Verbal consent was obtained for injection of the  right hip  Timeout was completed to confirm the injection site  The medications used were 40 mg of Depo-Medrol and 1% lidocaine 3 cc  Anesthesia was provided by ethyl chloride and the skin was prepped with alcohol.  After cleaning the skin with alcohol a 25-gauge needle was used to inject the  right bursa of the hip

## 2017-08-13 ENCOUNTER — Other Ambulatory Visit: Payer: Self-pay | Admitting: Family Medicine

## 2017-08-31 ENCOUNTER — Other Ambulatory Visit: Payer: Self-pay | Admitting: Family Medicine

## 2017-08-31 NOTE — Telephone Encounter (Signed)
Six mo worth ok 

## 2017-09-22 ENCOUNTER — Other Ambulatory Visit: Payer: Self-pay | Admitting: Adult Health

## 2017-09-25 ENCOUNTER — Other Ambulatory Visit: Payer: Self-pay | Admitting: Adult Health

## 2017-09-25 NOTE — Telephone Encounter (Signed)
Rx request sent to pharmacy.  

## 2017-10-14 ENCOUNTER — Ambulatory Visit: Payer: Medicare Other | Admitting: Adult Health

## 2017-10-14 ENCOUNTER — Encounter: Payer: Self-pay | Admitting: Adult Health

## 2017-10-14 VITALS — BP 126/75 | HR 64 | Ht 62.5 in | Wt 140.0 lb

## 2017-10-14 DIAGNOSIS — K625 Hemorrhage of anus and rectum: Secondary | ICD-10-CM | POA: Diagnosis not present

## 2017-10-14 DIAGNOSIS — R5383 Other fatigue: Secondary | ICD-10-CM

## 2017-10-14 DIAGNOSIS — K649 Unspecified hemorrhoids: Secondary | ICD-10-CM | POA: Diagnosis not present

## 2017-10-14 MED ORDER — LIDOCAINE 5 % EX OINT: 1.0000 "application " | TOPICAL_OINTMENT | Freq: Three times a day (TID) | CUTANEOUS | 1 refills | Status: DC | PRN

## 2017-10-14 NOTE — Progress Notes (Signed)
  Subjective:     Patient ID: Katherine Lane, female   DOB: January 02, 1933, 84 y.o.   MRN: [name]  HPI Katherine RhodesBetty is a 84 year old white female, widowed in complaining of rectal bleeding, from hemorrhoids and feeling fatigued and hurts to sit, has seen surgeon but decided against surgery at this time. PCP is Lubertha SouthSteve Luking.  Review of Systems +rectal bleeding ?from hemorrhoids +fatigue +pain when sits  Reviewed past medical,surgical, social and family history. Reviewed medications and allergies.     Objective:   Physical Exam BP 126/75 (BP Location: Left Arm, Patient Position: Sitting, Cuff Size: Normal)   Pulse 64   Ht 5' 2.5" (1.588 m)   Wt 140 lb (63.5 kg)   BMI 25.20 kg/m On rectal exam, has external hemorrhoids, that are irritated and has internal hemorrhoids, co exam with Dr Emelda FearFerguson, will not I&D today,put lidocaine 5% ointment on and use supp.  Will check labs today at her request, too.     Assessment:     1. Hemorrhoids, unspecified hemorrhoid type   2. Fatigue, unspecified type   3. Rectal bleeding       Plan:     Continue supp Meds ordered this encounter  Medications  . lidocaine (XYLOCAINE) 5 % ointment    Sig: Apply 1 application topically 3 (three) times daily as needed.    Dispense:  35.44 g    Refill:  1    Order Specific Question:   Supervising Provider    Answer:   Duane LopeEURE, LUTHER H [[id number]]  Check CBC, CMP and TSH Try warm moist compresses and frozen peas F/U in 2 weeks

## 2017-10-15 ENCOUNTER — Telehealth: Payer: Self-pay | Admitting: Adult Health

## 2017-10-15 LAB — COMPREHENSIVE METABOLIC PANEL
ALT: 8 IU/L (ref 0–32)
AST: 14 IU/L (ref 0–40)
Albumin/Globulin Ratio: 2.5 — ABNORMAL HIGH (ref 1.2–2.2)
Albumin: 4.9 g/dL — ABNORMAL HIGH (ref 3.5–4.7)
Alkaline Phosphatase: 84 IU/L (ref 39–117)
BUN/Creatinine Ratio: 15 (ref 12–28)
BUN: 13 mg/dL (ref 8–27)
Bilirubin Total: 0.5 mg/dL (ref 0.0–1.2)
CO2: 25 mmol/L (ref 20–29)
Calcium: 9.8 mg/dL (ref 8.7–10.3)
Chloride: 100 mmol/L (ref 96–106)
Creatinine, Ser: 0.85 mg/dL (ref 0.57–1.00)
GFR calc Af Amer: 74 mL/min/{1.73_m2} (ref >59)
GFR calc non Af Amer: 64 mL/min/{1.73_m2} (ref >59)
Globulin, Total: 2 g/dL (ref 1.5–4.5)
Glucose: 97 mg/dL (ref 65–99)
Potassium: 4.8 mmol/L (ref 3.5–5.2)
Sodium: 141 mmol/L (ref 134–144)
Total Protein: 6.9 g/dL (ref 6.0–8.5)

## 2017-10-15 LAB — CBC
Hematocrit: 44.1 % (ref 34.0–46.6)
Hemoglobin: 14.1 g/dL (ref 11.1–15.9)
MCH: 29 pg (ref 26.6–33.0)
MCHC: 32 g/dL (ref 31.5–35.7)
MCV: 91 fL (ref 79–97)
Platelets: 353 10*3/uL (ref 150–450)
RBC: 4.86 x10E6/uL (ref 3.77–5.28)
RDW: 14.5 % (ref 12.3–15.4)
WBC: 6.8 10*3/uL (ref 3.4–10.8)

## 2017-10-15 LAB — TSH: TSH: 2.49 u[IU]/mL (ref 0.450–4.500)

## 2017-10-15 NOTE — Telephone Encounter (Signed)
Pt aware labs looked good, and lidocaine helped the pain of her hemorrhoids

## 2017-10-15 NOTE — Telephone Encounter (Signed)
Phone busy.

## 2017-10-27 ENCOUNTER — Encounter: Payer: Self-pay | Admitting: Gastroenterology

## 2017-10-27 ENCOUNTER — Encounter: Payer: Self-pay | Admitting: Adult Health

## 2017-10-27 ENCOUNTER — Ambulatory Visit: Payer: Medicare Other | Admitting: Adult Health

## 2017-10-27 VITALS — BP 125/77 | HR 69 | Ht 62.5 in | Wt 138.5 lb

## 2017-10-27 DIAGNOSIS — K649 Unspecified hemorrhoids: Secondary | ICD-10-CM

## 2017-10-27 DIAGNOSIS — K625 Hemorrhage of anus and rectum: Secondary | ICD-10-CM | POA: Diagnosis not present

## 2017-10-27 MED ORDER — LIDOCAINE 5 % EX OINT: 1.0000 "application " | TOPICAL_OINTMENT | Freq: Three times a day (TID) | CUTANEOUS | 1 refills | Status: DC | PRN

## 2017-10-27 NOTE — Progress Notes (Signed)
  Subjective:     Patient ID: Katherine Lane, female   DOB: Mar 04, 1932, 84 y.o.   MRN: [id number]  HPI Katherine Lane is a 84 year old white female in for recheck on hemorrhoids, still bleeding and has some pain.Her daughter [name] is with her.  Review of Systems +pain and bleeding still Mildly short of breath today  Reviewed past medical,surgical, social and family history. Reviewed medications and allergies.     Objective:   Physical Exam BP 125/77 (BP Location: Left Arm, Patient Position: Sitting, Cuff Size: Normal)   Pulse 69   Ht 5' 2.5" (1.588 m)   Wt 138 lb 8 oz (62.8 kg)   BMI 24.93 kg/m    Skin warm and dry. Lungs: clear to ausculation bilaterally. Cardiovascular: regular rate and rhythm.On rectal exam has external hemorrhoid, no clot seen and no bleeding, has internal hemorrhoids palpated with my little finger. She has seen surgeon in [place] in past and does not want surgery, but when I told her about banding she is interested, will refer to Dr Darrick Penna, who did her colonoscopy years ago, to evaluate.   Assessment:     1. Hemorrhoids, unspecified hemorrhoid type   2. Rectal bleeding       Plan:     Referred to Dr Darrick Penna to evaluate hemorrhoids Can use frozen peas prn to bottom Meds ordered this encounter  Medications  . lidocaine (XYLOCAINE) 5 % ointment    Sig: Apply 1 application topically 3 (three) times daily as needed.    Dispense:  35.44 g    Refill:  1    Order Specific Question:   Supervising Provider    Answer:   Duane Lope H [[id number]]  F/U prn Given handout on hemorrhoid banding

## 2017-10-28 ENCOUNTER — Encounter: Payer: Self-pay | Admitting: Cardiovascular Disease

## 2017-10-28 ENCOUNTER — Ambulatory Visit: Payer: Medicare Other | Admitting: Cardiovascular Disease

## 2017-10-28 VITALS — BP 132/78 | HR 80 | Ht 62.0 in | Wt 140.0 lb

## 2017-10-28 DIAGNOSIS — I10 Essential (primary) hypertension: Secondary | ICD-10-CM | POA: Diagnosis not present

## 2017-10-28 DIAGNOSIS — Z7901 Long term (current) use of anticoagulants: Secondary | ICD-10-CM

## 2017-10-28 DIAGNOSIS — I5032 Chronic diastolic (congestive) heart failure: Secondary | ICD-10-CM

## 2017-10-28 DIAGNOSIS — I481 Persistent atrial fibrillation: Secondary | ICD-10-CM

## 2017-10-28 DIAGNOSIS — Z79899 Other long term (current) drug therapy: Secondary | ICD-10-CM

## 2017-10-28 DIAGNOSIS — I4819 Other persistent atrial fibrillation: Secondary | ICD-10-CM

## 2017-10-28 NOTE — Patient Instructions (Addendum)
Your physician wants you to follow-up in:4 months with Randall An PA-C     Take an extra Toprol XL 25 mg today     All other meds stay the same      No lab work or tests today     Thank you for choosing [facility] !

## 2017-10-28 NOTE — Progress Notes (Signed)
SUBJECTIVE: The patient presents for follow-up of accelerated hypertension.  I started hydralazine 25 mg twice daily in mid April.  Toprol-XL was increased to 75 mg daily on 06/25/2017.  Blood pressure was normal at providers office yesterday, 125/77.  She had been feeling well until these past few days. She has been more anxious as this is the time of year her husband was put into hospice. She has also lacked motivation to get up and do anything over the past week. She said has felt "unwell". She denies chest pain, palpitations, and shortness of breath. Her BP has been fluctuating more recently.  ECG performed in the office today which I ordered and personally interpreted demonstrates rapid atrial fibrillation, 114 bpm.  Labs 10/14/17: Sodium 141, potassium 4.8, BUN 13, creatinine 0.85, normal CBC, TSH 2.49.    Review of Systems: As per "subjective", otherwise negative.  Allergies  Allergen Reactions  . Amlodipine Swelling    Leg swelling   . Codeine Nausea And Vomiting and Other (See Comments)    Patient states "intolerance to all pain medications"  (NO OPIOIDS) VERY VIOLENT VOMITING!!  . Other Nausea And Vomiting and Other (See Comments)    general anesthesia  . Tramadol Nausea And Vomiting and Other (See Comments)    "NO OPIOIDS!!! VERY VIOLENT VOMITING!!" per Med History prior to 02/18/17    Current Outpatient Medications  Medication Sig Dispense Refill  . ANALPRAM-HC 1-1 % rectal cream APPLY TO AFFECTED AREAS TWICE DAILY. (Patient taking differently: APPLY TO AFFECTED AREAS TWICE DAILY AS NEEDED FOR HEMORRHOIDS) 28.4 g prn  . Carboxymethylcellul-Glycerin (LUBRICATING EYE DROPS OP) Place 1 drop into both eyes 3 (three) times daily.     . hydrALAZINE (APRESOLINE) 25 MG tablet 25 mg 2 (two) times daily.     Marland Kitchen HYDROmorphone (DILAUDID) 2 MG tablet as needed.     . lidocaine (XYLOCAINE) 5 % ointment Apply 1 application topically 3 (three) times daily as needed. 35.44 g 1  .  LORazepam (ATIVAN) 0.5 MG tablet One up to BID (Patient taking differently: Take 0.5 mg by mouth See admin instructions. BID prn, Pt takes 1 in am prn and takes 1 every evening) 60 tablet 5  . losartan (COZAAR) 50 MG tablet Take 50 mg by mouth 2 (two) times daily.    . metoprolol succinate (TOPROL XL) 25 MG 24 hr tablet Take 1 tablet (25 mg total) by mouth daily. Take in addition to the 50 mg tablet daily for a total of 75 mg daily 90 tablet 3  . pantoprazole (PROTONIX) 40 MG tablet TAKE 1 TABLET BY MOUTH ONCE A DAY. (Patient taking differently: as needed. ) 30 tablet 0  . polyethylene glycol powder (MIRALAX) powder Take 17 g daily as needed by mouth for moderate constipation.     . rivaroxaban (XARELTO) 20 MG TABS tablet TAKE 1 TABLET DAILY WITH SUPPER. 90 tablet 2  . zolpidem (AMBIEN) 10 MG tablet TAKE 1 TABLET BY MOUTH AT BEDTIME FOR SLEEP. 30 tablet 5   No current facility-administered medications for this visit.     Past Medical History:  Diagnosis Date  . A-fib (HCC)   . Anxiety   . Arthritis   . CHF (congestive heart failure) (HCC)   . Chronic back pain   . Constipation   . Constipation, chronic   . Depression   . Family history of adverse reaction to anesthesia    Mother, Sister, daughter- N/V  . History of blood transfusion    "  pregnancy"  . HTN (hypertension)   . Insomnia   . Migraine headache   . Osteoporosis   . PONV (postoperative nausea and vomiting)    "violent"  . Pulmonary edema 06/2016  . Sodium (Na) deficiency   . Trigeminal neuralgia     Past Surgical History:  Procedure Laterality Date  . ANKLE FRACTURE SURGERY Right   . CARDIAC CATHETERIZATION     denies  . CATARACT EXTRACTION W/PHACO Right 07/22/2012   Procedure: CATARACT EXTRACTION PHACO AND INTRAOCULAR LENS PLACEMENT (IOC);  Surgeon: Gemma Payor, MD;  Location: [facility];  Service: Ophthalmology;  Laterality: Right;  CDE:  18.93  . CATARACT EXTRACTION W/PHACO Left 08/09/2012   Procedure: CATARACT  EXTRACTION PHACO AND INTRAOCULAR LENS PLACEMENT (IOC);  Surgeon: Gemma Payor, MD;  Location: [facility];  Service: Ophthalmology;  Laterality: Left;  CDE: 17.21  . COLONOSCOPY    . HARDWARE REMOVAL  07/18/2011   Procedure: HARDWARE REMOVAL; Right leg-  Surgeon: Vickki Hearing, MD;  Location: [facility];  Service: Orthopedics;  Laterality: Right;  . KYPHOPLASTY N/A 12/26/2016   Procedure: Lumbar two Lumbar three and Lumbar five KYPHOPLASTY;  Surgeon: Maeola Harman, MD;  Location: [facility] OR;  Service: Neurosurgery;  Laterality: N/A;  . KYPHOPLASTY N/A 02/19/2017   Procedure: KYPHOPLASTY LUMBAR ONE;  Surgeon: Maeola Harman, MD;  Location: [facility];  Service: Neurosurgery;  Laterality: N/A;  KYPHOPLASTY LUMBAR ONE  . NERVE REPAIR  07/18/2011   Procedure: NERVE REPAIR;  Surgeon: Vickki Hearing, MD;  Location: [facility];  Service: Orthopedics;  Laterality: Right;  Right leg superficial peroneal nerve release    . PARTIAL HYSTERECTOMY      Social History   Socioeconomic History  . Marital status: Widowed    Spouse name: Not on file  . Number of children: Not on file  . Years of education: Not on file  . Highest education level: Not on file  Occupational History  . Occupation: retired    Associate Professor: RETIRED  Social Needs  . Financial resource strain: Not on file  . Food insecurity:    Worry: Not on file    Inability: Not on file  . Transportation needs:    Medical: Not on file    Non-medical: Not on file  Tobacco Use  . Smoking status: Never Smoker  . Smokeless tobacco: Never Used  Substance and Sexual Activity  . Alcohol use: No    Alcohol/week: 0.0 standard drinks  . Drug use: No  . Sexual activity: Not Currently    Birth control/protection: Surgical    Comment: hyst  Lifestyle  . Physical activity:    Days per week: Not on file    Minutes per session: Not on file  . Stress: Not on file  Relationships  . Social connections:    Talks on phone: Not on file    Gets together: Not on file     Attends religious service: Not on file    Active member of club or organization: Not on file    Attends meetings of clubs or organizations: Not on file    Relationship status: Not on file  . Intimate partner violence:    Fear of current or ex partner: Not on file    Emotionally abused: Not on file    Physically abused: Not on file    Forced sexual activity: Not on file  Other Topics Concern  . Not on file  Social History Narrative  . Not on file  Vitals:   10/28/17 1257  BP: 132/78  Pulse: 80  SpO2: 97%  Weight: 140 lb (63.5 kg)  Height: 5\' 2"  (1.575 m)    Wt Readings from Last 3 Encounters:  10/28/17 140 lb (63.5 kg)  10/27/17 138 lb 8 oz (62.8 kg)  10/14/17 140 lb (63.5 kg)     PHYSICAL EXAM General: NAD HEENT: Normal. Neck: No JVD, no thyromegaly. Lungs: Clear to auscultation bilaterally with normal respiratory effort. CV: Tachycardic, irregular rhythm, normal S1/S2, no S3, no murmur. No pretibial or periankle edema.   Abdomen: Soft, nontender, no distention.  Neurologic: Alert and oriented.  Psych: Normal affect. Skin: Normal. Musculoskeletal: No gross deformities.    ECG: Reviewed above under Subjective   Labs: Lab Results  Component Value Date/Time   K 4.8 10/14/2017 12:50 PM   BUN 13 10/14/2017 12:50 PM   CREATININE 0.85 10/14/2017 12:50 PM   CREATININE 0.90 10/18/2014 10:38 AM   ALT 8 10/14/2017 12:50 PM   TSH 2.490 10/14/2017 12:50 PM   HGB 14.1 10/14/2017 12:50 PM     Lipids: Lab Results  Component Value Date/Time   LDLCALC 93 09/24/2015 08:37 AM   CHOL 177 09/24/2015 08:37 AM   TRIG 133 09/24/2015 08:37 AM   HDL 57 09/24/2015 08:37 AM       ASSESSMENT AND PLAN:  1.  Persistent atrial fibrillation: Currently in symptomatic rapid atrial fibrillation.  I will have her take an extra Toprol-XL 25 mg today in addition to the 75 mg she took this morning, as the extra dose usually suppresses her atrial fibrillation.   I talked to her  about the possibility of antiarrhythmic therapy in the future.  Systemically anticoagulated with Xarelto.  2.  Accelerated hypertension: Blood pressure is normal today and was yesterday as well.    She is currently taking hydralazine 25 mg twice daily, losartan 50 mg twice daily, and Toprol-XL 75 mg daily.  She was intolerant to amlodipine in the past.  3.  Chronic diastolic heart failure: Diuretic therapy has led to hyponatremia in the past.  Continue conservative measures including sodium and fluid restriction.  She takes Lasix as needed.   Disposition: Follow up or months   Prentice Docker, M.D., F.A.C.C.

## 2017-10-31 ENCOUNTER — Other Ambulatory Visit: Payer: Self-pay

## 2017-10-31 ENCOUNTER — Emergency Department (HOSPITAL_COMMUNITY): Payer: Medicare Other

## 2017-10-31 ENCOUNTER — Observation Stay (HOSPITAL_COMMUNITY)
Admission: EM | Admit: 2017-10-31 | Discharge: 2017-11-01 | Disposition: A | Discharge status: Home / Self Care | Payer: Medicare Other | Attending: Family Medicine | Admitting: Family Medicine

## 2017-10-31 ENCOUNTER — Encounter (HOSPITAL_COMMUNITY): Payer: Self-pay | Admitting: Emergency Medicine

## 2017-10-31 DIAGNOSIS — I110 Hypertensive heart disease with heart failure: Secondary | ICD-10-CM | POA: Diagnosis not present

## 2017-10-31 DIAGNOSIS — R0789 Other chest pain: Secondary | ICD-10-CM | POA: Diagnosis not present

## 2017-10-31 DIAGNOSIS — I4892 Unspecified atrial flutter: Secondary | ICD-10-CM | POA: Diagnosis not present

## 2017-10-31 DIAGNOSIS — R079 Chest pain, unspecified: Secondary | ICD-10-CM | POA: Insufficient documentation

## 2017-10-31 DIAGNOSIS — R072 Precordial pain: Principal | ICD-10-CM | POA: Insufficient documentation

## 2017-10-31 DIAGNOSIS — I10 Essential (primary) hypertension: Secondary | ICD-10-CM | POA: Diagnosis present

## 2017-10-31 DIAGNOSIS — I5032 Chronic diastolic (congestive) heart failure: Secondary | ICD-10-CM | POA: Diagnosis not present

## 2017-10-31 DIAGNOSIS — I480 Paroxysmal atrial fibrillation: Secondary | ICD-10-CM | POA: Diagnosis present

## 2017-10-31 DIAGNOSIS — I4891 Unspecified atrial fibrillation: Secondary | ICD-10-CM | POA: Insufficient documentation

## 2017-10-31 LAB — BASIC METABOLIC PANEL
Anion gap: 11 (ref 5–15)
BUN: 16 mg/dL (ref 8–23)
CO2: 26 mmol/L (ref 22–32)
Calcium: 9.2 mg/dL (ref 8.9–10.3)
Chloride: 105 mmol/L (ref 98–111)
Creatinine, Ser: 0.94 mg/dL (ref 0.44–1.00)
GFR calc Af Amer: >60 mL/min (ref >60)
GFR calc non Af Amer: 55 mL/min — ABNORMAL LOW (ref >60)
Glucose, Bld: 137 mg/dL — ABNORMAL HIGH (ref 70–99)
Potassium: 3.6 mmol/L (ref 3.5–5.1)
Sodium: 142 mmol/L (ref 135–145)

## 2017-10-31 LAB — BRAIN NATRIURETIC PEPTIDE: B Natriuretic Peptide: 353 pg/mL — ABNORMAL HIGH (ref 0.0–100.0)

## 2017-10-31 LAB — CBC
HCT: 40.5 % (ref 36.0–46.0)
Hemoglobin: 13.1 g/dL (ref 12.0–15.0)
MCH: 29.4 pg (ref 26.0–34.0)
MCHC: 32.3 g/dL (ref 30.0–36.0)
MCV: 90.8 fL (ref 78.0–100.0)
Platelets: 299 10*3/uL (ref 150–400)
RBC: 4.46 MIL/uL (ref 3.87–5.11)
RDW: 13.9 % (ref 11.5–15.5)
WBC: 6.2 10*3/uL (ref 4.0–10.5)

## 2017-10-31 LAB — I-STAT TROPONIN, ED: Troponin i, poc: 0.01 ng/mL (ref 0.00–0.08)

## 2017-10-31 IMAGING — CT CT CERVICAL SPINE W/O CM
4 of 7 series · 14 of 33 positions shown, 15 images · non-contrast
Comparison: Head CT from the same day reported separately.

CLINICAL DATA: 82-year-old female with syncope at 1612 hours.
Subsequent confusion. Initial encounter.

EXAM:
CT CERVICAL SPINE WITHOUT CONTRAST
TECHNIQUE: Multidetector CT imaging of the cervical spine was performed without
intravenous contrast. Multiplanar CT image reconstructions were also
generated.

[Series 5: coronal soft tissue · coronal · 0.29mm/px · 3 of 70 slices shown]
[im 18/70  bone]
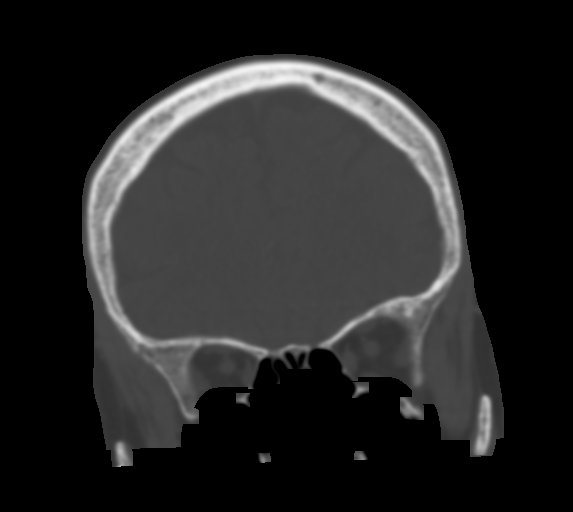
[im 35/70  bone]
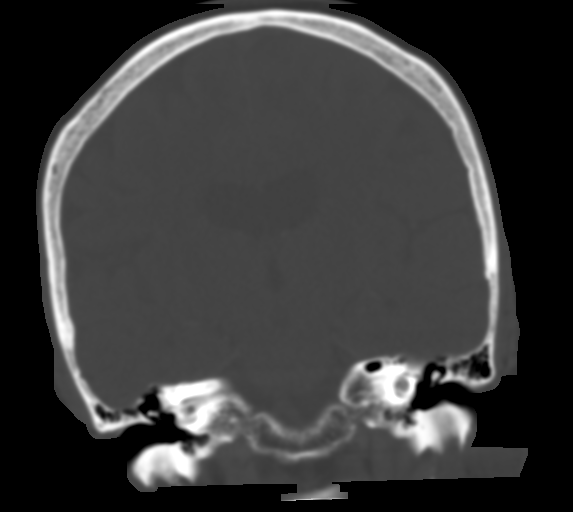
[im 52/70  bone]
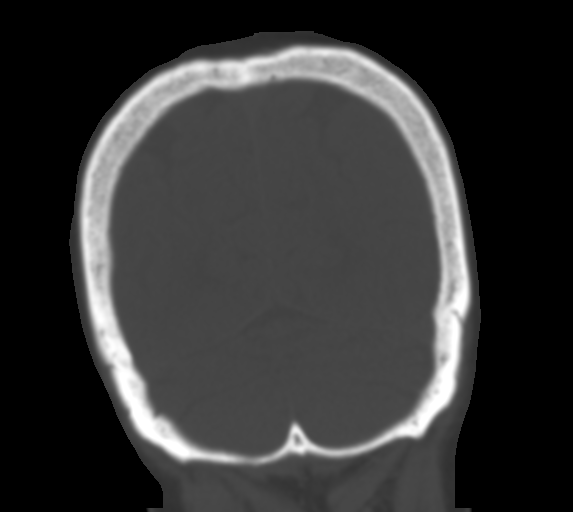

[Series 8: c spine soft · axial · 0.38mm/px · z∈[-143,-79]mm · 3 of 81 slices shown]
[im 17/81  soft-tissue]
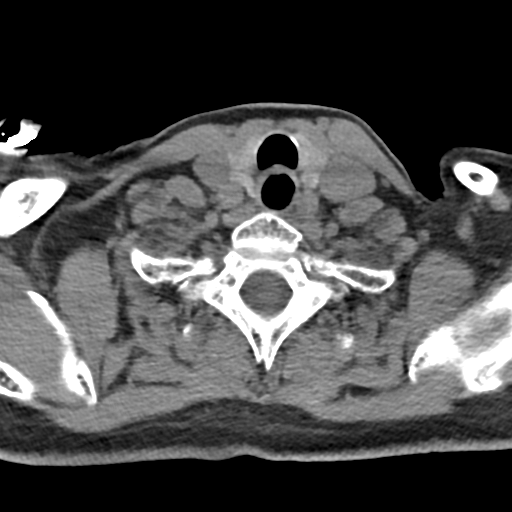
[im 33/81  soft-tissue]
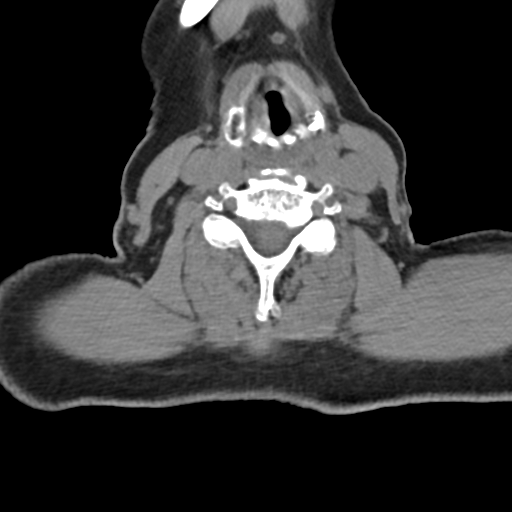
[im 49/81  soft-tissue]
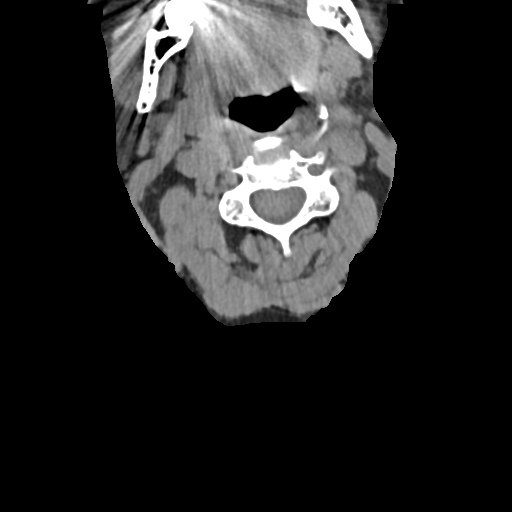

[Series 9: sagittal bone · sagittal · 0.20mm/px · 4 of 61 slices shown]
[im 13/61  bone]
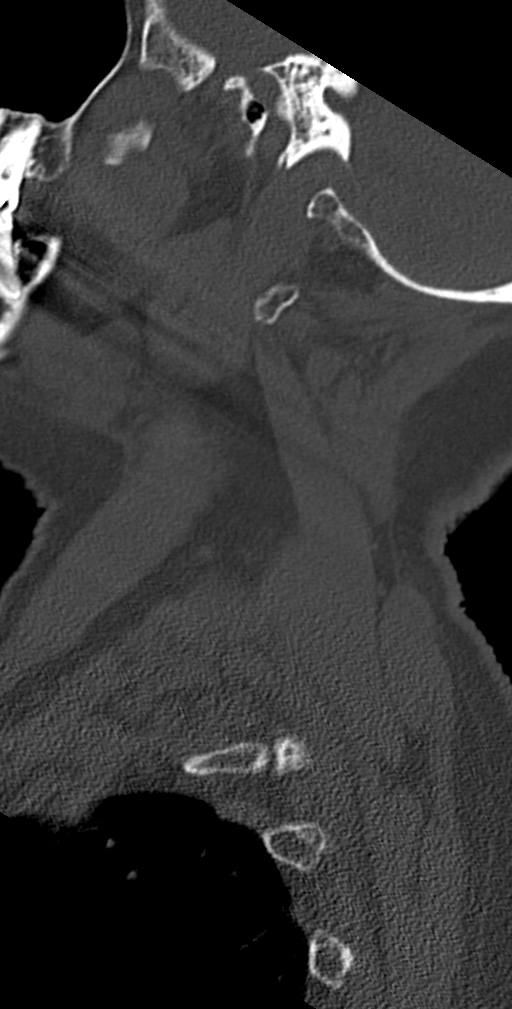
[im 25/61  bone]
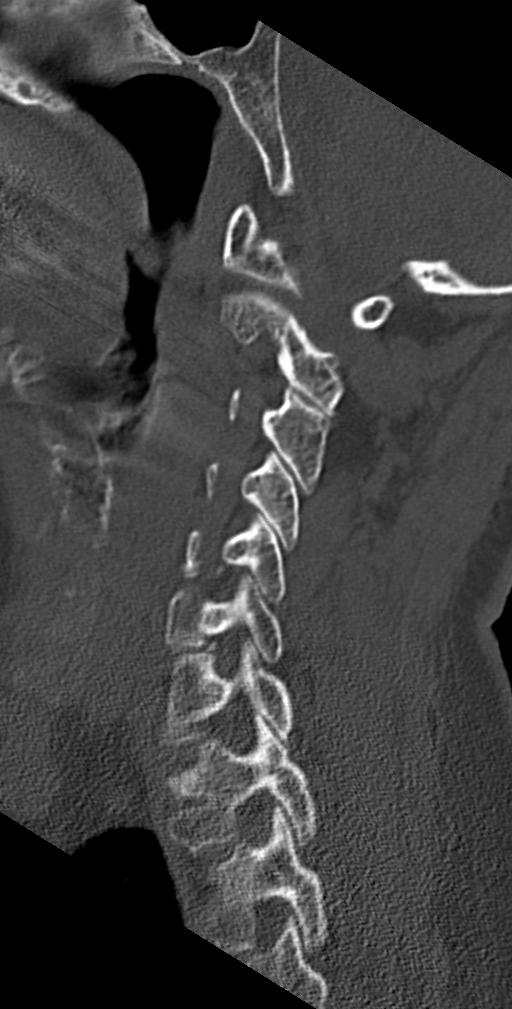
[im 37/61  bone]
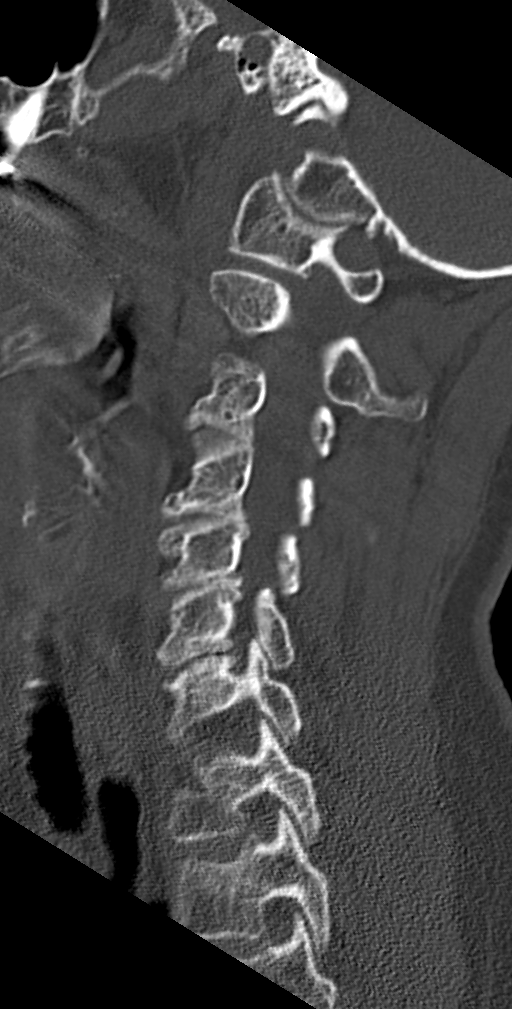
[im 49/61  bone]
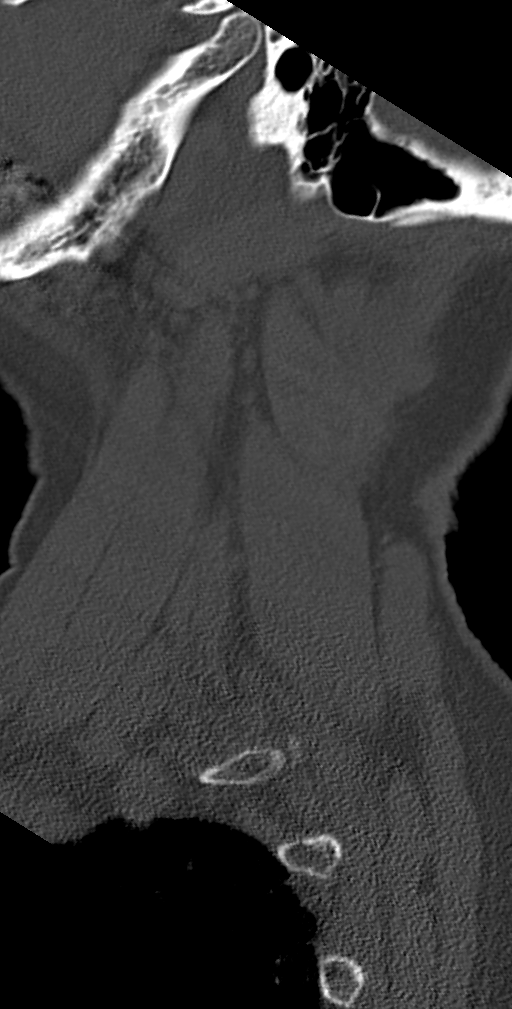

[Series 13: orthogonal bone · axial · 0.21mm/px · z∈[-165,-86]mm · 4 of 86 slices shown, 5 images]
[im 18/86  soft-tissue]
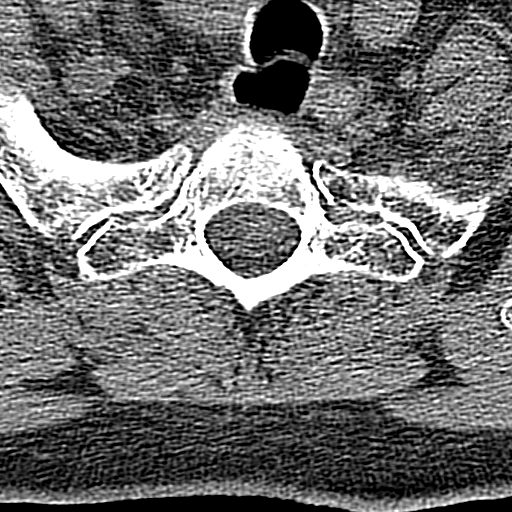
[im 18/86  bone]
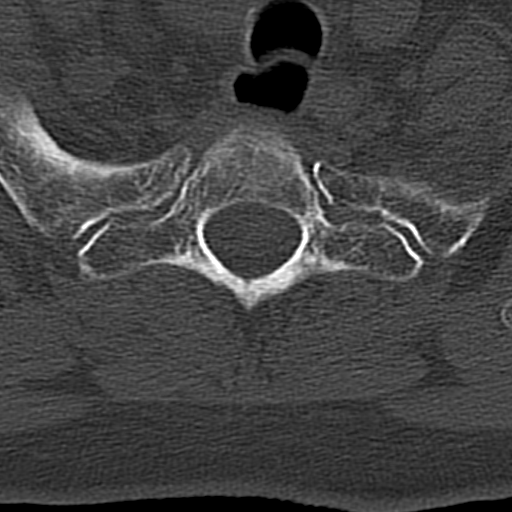
[im 35/86  bone]
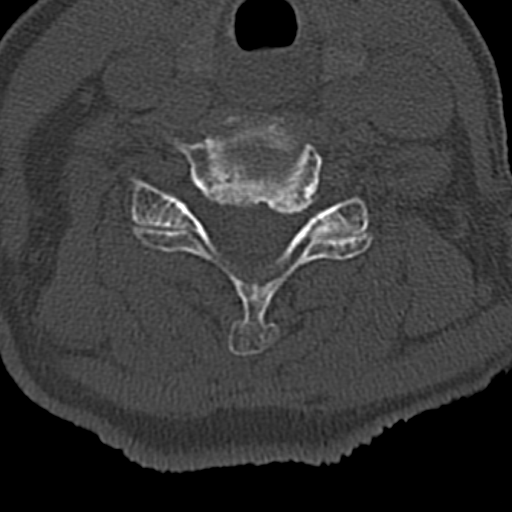
[im 52/86  bone]
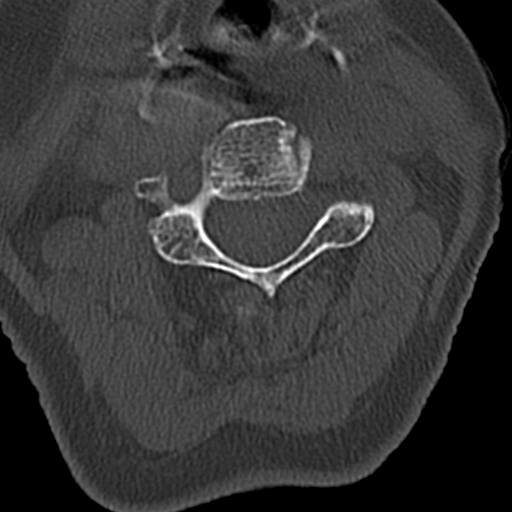
[im 69/86  bone]
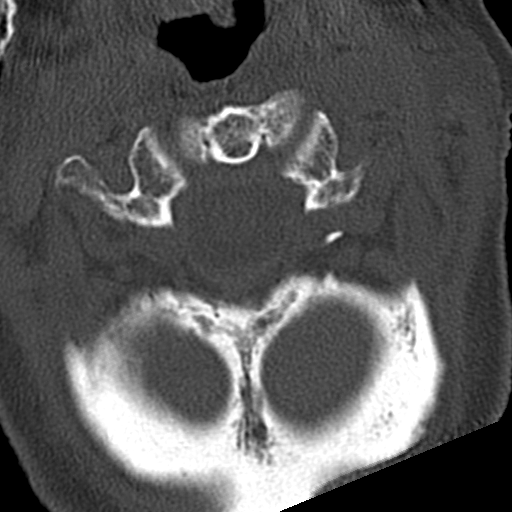

[14 of 33 positions shown; findings below may reference images not displayed]

FINDINGS: Alignment: Straightening of cervical lordosis. Mild retrolisthesis
of C5 on C6. Cervicothoracic junction alignment is within normal
limits. Bilateral posterior element alignment is within normal
limits.

Skull base and vertebrae: Osteopenia. Visualized skull base is
intact. No atlanto-occipital dissociation. No cervical spine
fracture identified.

Soft tissues and spinal canal: No prevertebral fluid or swelling. No
visible canal hematoma.

Motion artifact at the hypopharynx. Negative noncontrast neck soft
tissues.

Disc levels: Lower cervical disc and endplate degeneration. No
cervical spinal stenosis.

Upper chest: Visible upper thoracic levels appear intact. Negative
lung apices.
IMPRESSION: 1.  No acute fracture or listhesis identified in the cervical spine.
2. CT head reported separately.

## 2017-10-31 IMAGING — MR MR HEAD W/O CM
8 of 10 series · 39 of 48 positions shown · non-contrast
Comparison: CT HEAD December 06, 2015 at 3339 hours

CLINICAL DATA: Syncopal episode in bathroom, transient blindness.
Confusion. Concurrent severe hypertension. History of migraine
headaches and trigeminal neuralgia, atrial fibrillation.

EXAM:
MRI HEAD WITHOUT CONTRAST
MRA HEAD WITHOUT CONTRAST
TECHNIQUE: Multiplanar, multiecho pulse sequences of the brain and surrounding
structures were obtained without intravenous contrast. Angiographic
images of the head were obtained using MRA technique without
contrast.

[Series 3: DWI · axial · 3.0mm · 0.82mm/px · z∈[-38,+121]mm · 7 of 53 slices shown (1 of 4)]
[im 1/53]
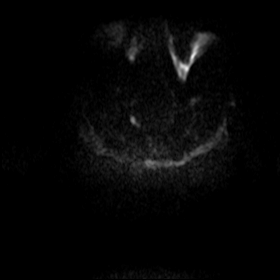
[im 9/53]
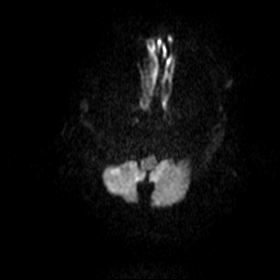
[im 18/53]
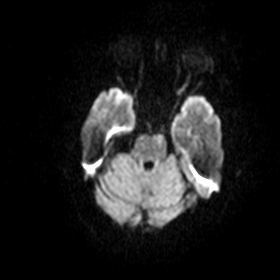
[im 27/53]
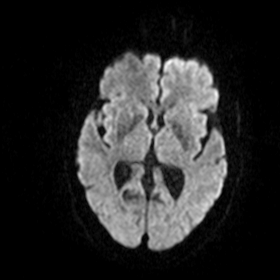
[im 35/53]
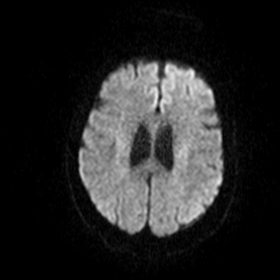
[im 44/53]
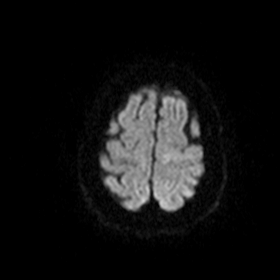
[im 53/53]
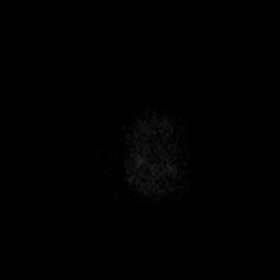

[Series 4: DWI · axial · 3.0mm · 0.82mm/px · z∈[-41,+121]mm · 7 of 55 slices shown (2 of 4)]
[im 1/55]
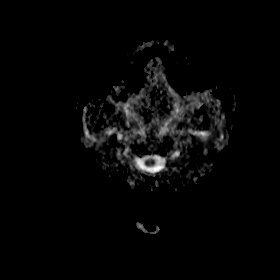
[im 10/55]
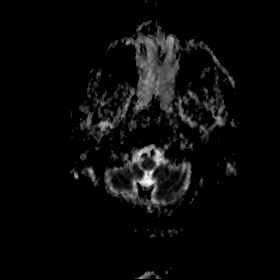
[im 19/55]
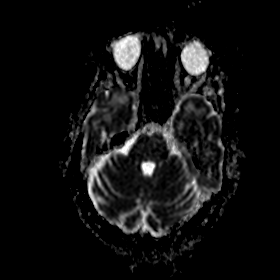
[im 28/55]
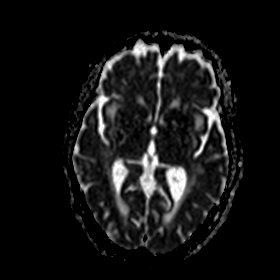
[im 37/55]
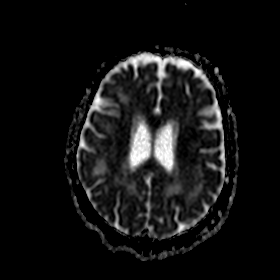
[im 46/55]
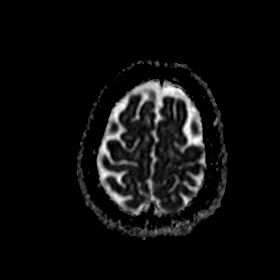
[im 55/55]
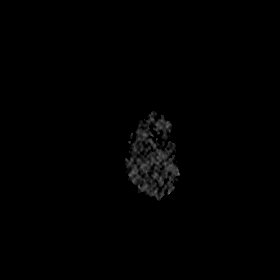

[Series 5: DWI · coronal · 5.0mm · 0.60mm/px · 4 of 34 slices shown (3 of 4)]
[im 1/34]
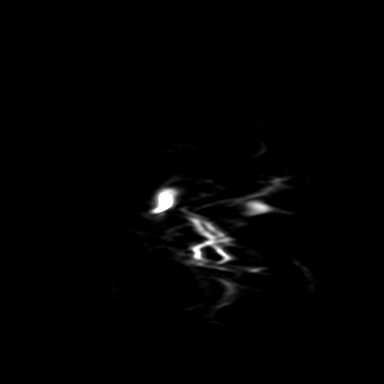
[im 12/34]
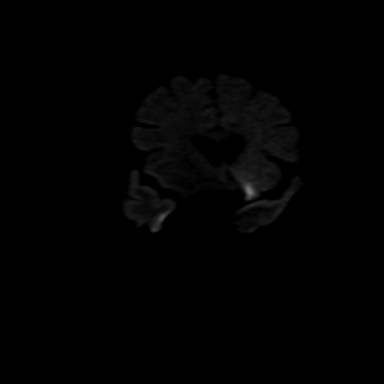
[im 23/34]
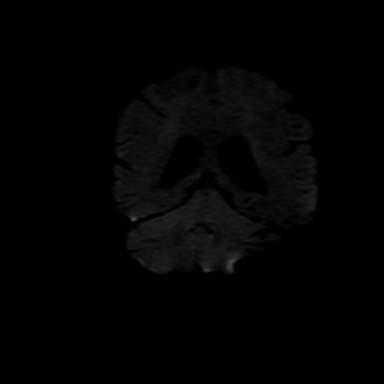
[im 34/34]
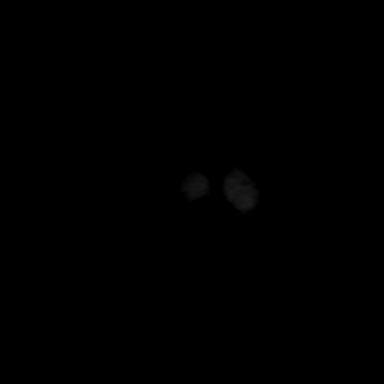

[Series 6: DWI · coronal · 5.0mm · 0.60mm/px · 4 of 34 slices shown (4 of 4)]
[im 1/34]
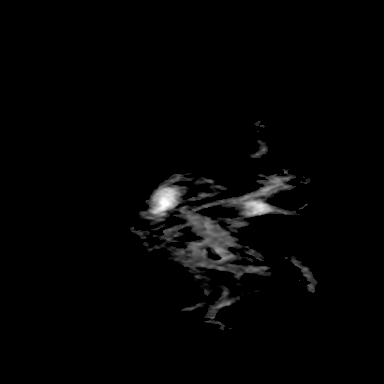
[im 12/34]
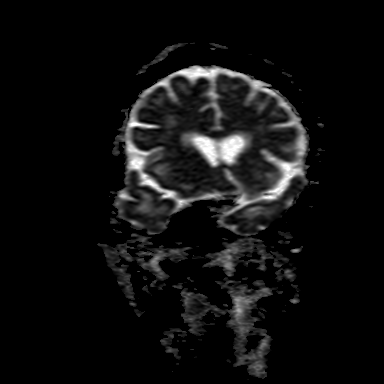
[im 23/34]
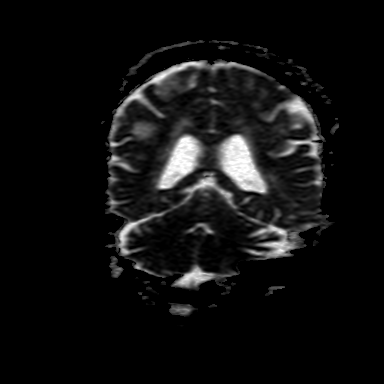
[im 34/34]
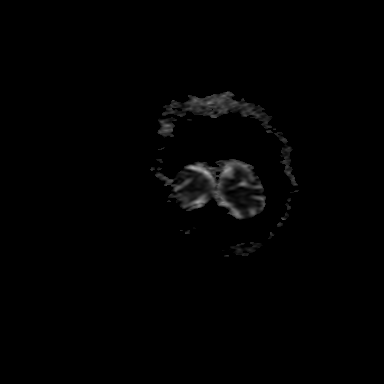

[Series 7: T2 · axial · 5.0mm · 0.65mm/px · z∈[-32,+111]mm · 3 of 23 slices shown (1 of 2)]
[im 1/23]
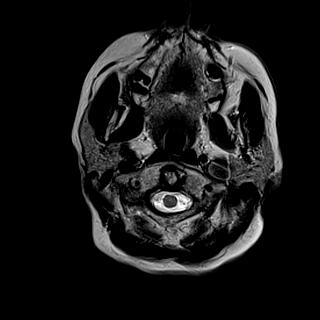
[im 12/23]
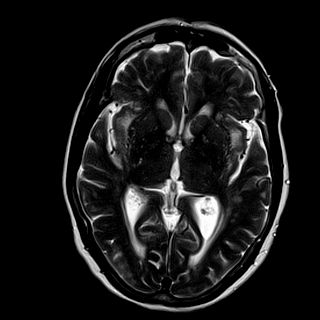
[im 23/23]
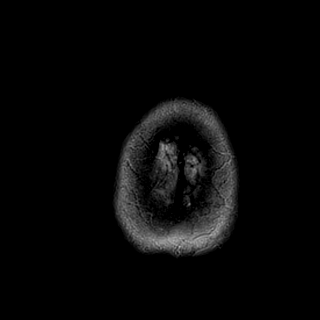

[Series 8: FLAIR · axial · 5.0mm · 0.89mm/px · z∈[-32,+111]mm · 3 of 23 slices shown]
[im 1/23]
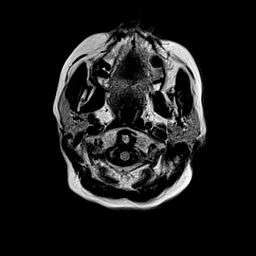
[im 12/23]
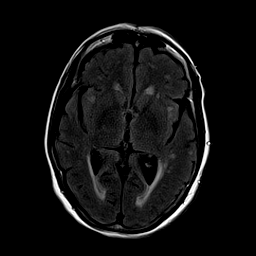
[im 23/23]
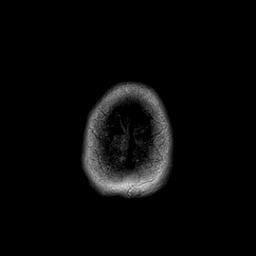

[Series 9: T1 · axial · 2.0mm · 0.41mm/px · z∈[-37,+105]mm · 7 of 81 slices shown]
[im 1/81]
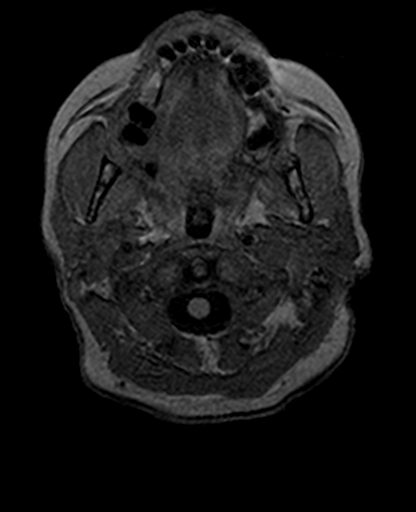
[im 9/81]
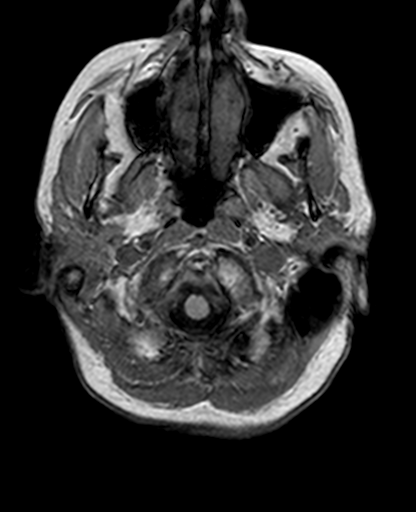
[im 27/81]
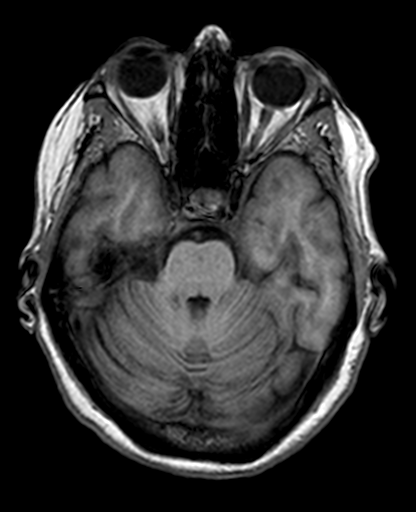
[im 36/81]
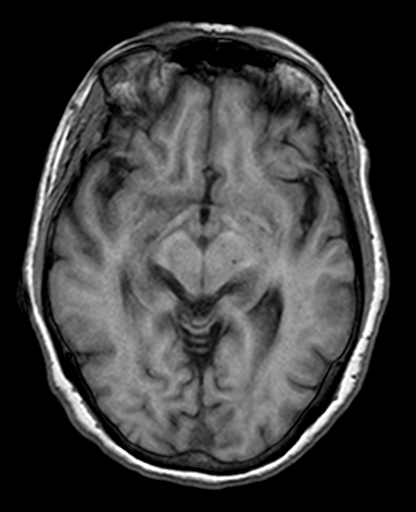
[im 45/81]
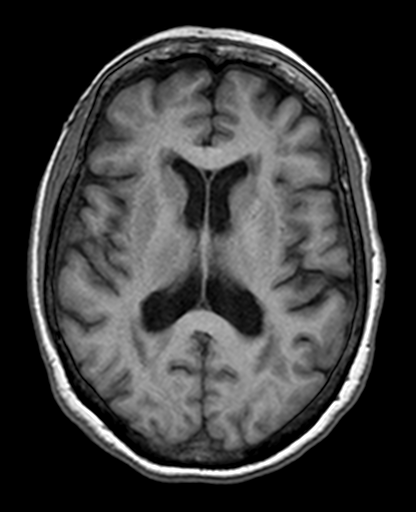
[im 54/81]
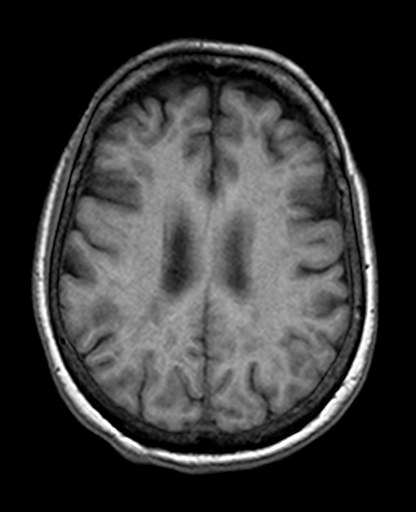
[im 72/81]
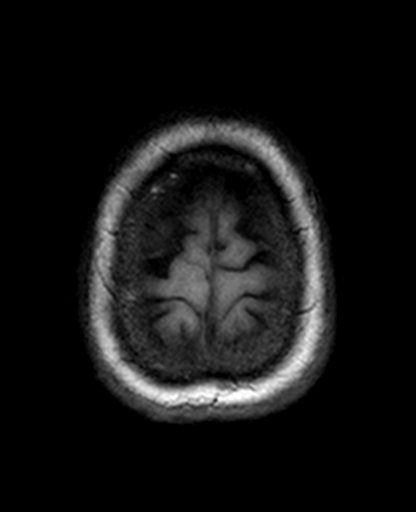

[Series 11: T2 · coronal · 5.0mm · 0.58mm/px · 4 of 28 slices shown (2 of 2)]
[im 1/28]
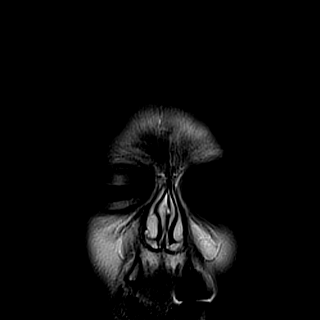
[im 10/28]
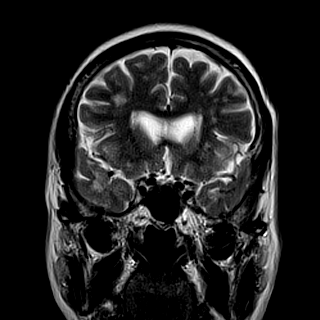
[im 19/28]
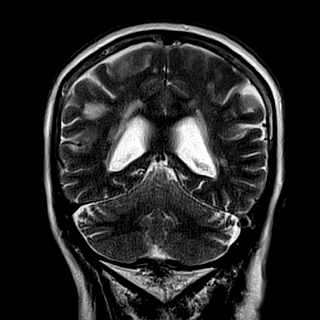
[im 28/28]
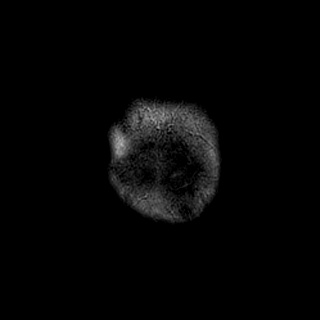

[39 of 48 positions shown; findings below may reference images not displayed]

FINDINGS: MRI HEAD FINDINGS

BRAIN: No reduced diffusion to suggest acute ischemia. No
susceptibility artifact to suggest hemorrhage. The ventricles and
sulci are normal for patient's age. Patchy to confluent
supratentorial white matter FLAIR T2 hyperintensities including
disproportion subcortical white matter involvement. No suspicious
parenchymal signal, masses or mass effect. No abnormal extra-axial
fluid collections. No extra-axial masses though, contrast enhanced
sequences would be more sensitive.

VASCULAR: Normal major intracranial vascular flow voids present at
skull base.

SKULL AND UPPER CERVICAL SPINE: No abnormal sellar expansion. No
suspicious calvarial bone marrow signal. Craniocervical junction
maintained.

SINUSES/ORBITS: The mastoid air-cells and included paranasal sinuses
are well-aerated. Status post bilateral ocular lens implants. The
included ocular globes and orbital contents are non-suspicious.

OTHER: None.

MRA HEAD FINDINGS- signal loss favored to reflect patient motion.

ANTERIOR CIRCULATION: Flow related enhancement of the included
cervical, petrous, cavernous and supraclinoid internal carotid
arteries. Narrowed LEFT cavernous internal carotid artery flow void
favoring artifact. Patent anterior communicating artery. Normal flow
related enhancement of the anterior and middle cerebral arteries,
including distal segments.

No large vessel occlusion, high-grade stenosis, abnormal luminal
irregularity, aneurysm.

POSTERIOR CIRCULATION: Codominant vertebral artery's. Basilar artery
is patent, with normal flow related enhancement of the main branch
vessels. Normal flow related enhancement of the posterior cerebral
arteries. Mild stenosis RIGHT P1 2 junction versus flow artifact
from tortuosity.

No large vessel occlusion, high-grade stenosis, abnormal luminal
irregularity, aneurysm.
IMPRESSION: MRI HEAD: No acute intracranial process.

Moderate to severe white matter changes most compatible with chronic
small vessel ischemic disease. Considering distribution of white
matter changes, superimposed chronic changes from trauma, old
infection or vasculopathy are possible.

MRA HEAD: No emergent large vessel occlusion or severe stenosis.

## 2017-10-31 MED ORDER — CARBOXYMETHYLCELLUL-GLYCERIN 0.5-0.9 % OP SOLN
1.0000 [drp] | Freq: Three times a day (TID) | OPHTHALMIC | Status: DC
  Filled 2017-10-31: qty 15

## 2017-10-31 MED ORDER — LOSARTAN POTASSIUM 25 MG PO TABS: 50.0000 mg | ORAL_TABLET | Freq: Two times a day (BID) | ORAL | Status: DC

## 2017-10-31 MED ORDER — LORAZEPAM 0.5 MG PO TABS
0.5000 mg | ORAL_TABLET | Freq: Two times a day (BID) | ORAL | Status: DC | PRN
  Administered 2017-11-01: 0.5 mg via ORAL
  Filled 2017-10-31: qty 1

## 2017-10-31 MED ORDER — SODIUM CHLORIDE 0.9% FLUSH: 3.0000 mL | INTRAVENOUS | Status: DC | PRN

## 2017-10-31 MED ORDER — SODIUM CHLORIDE 0.9% FLUSH
3.0000 mL | Freq: Two times a day (BID) | INTRAVENOUS | Status: DC
  Administered 2017-11-01 (×2): 3 mL via INTRAVENOUS

## 2017-10-31 MED ORDER — HYDRALAZINE HCL 20 MG/ML IJ SOLN: 10.0000 mg | INTRAMUSCULAR | Status: DC | PRN

## 2017-10-31 MED ORDER — METOPROLOL SUCCINATE ER 25 MG PO TB24: 25.0000 mg | ORAL_TABLET | Freq: Every day | ORAL | Status: DC

## 2017-10-31 MED ORDER — ACETAMINOPHEN 650 MG RE SUPP: 650.0000 mg | Freq: Four times a day (QID) | RECTAL | Status: DC | PRN

## 2017-10-31 MED ORDER — LOSARTAN POTASSIUM 50 MG PO TABS
50.0000 mg | ORAL_TABLET | Freq: Two times a day (BID) | ORAL | Status: DC
  Administered 2017-11-01: 50 mg via ORAL
  Filled 2017-10-31: qty 1

## 2017-10-31 MED ORDER — HYDRALAZINE HCL 25 MG PO TABS
25.0000 mg | ORAL_TABLET | Freq: Two times a day (BID) | ORAL | Status: DC
  Administered 2017-11-01: 25 mg via ORAL
  Filled 2017-10-31: qty 1

## 2017-10-31 MED ORDER — PANTOPRAZOLE SODIUM 40 MG PO TBEC
40.0000 mg | DELAYED_RELEASE_TABLET | Freq: Every day | ORAL | Status: DC
  Administered 2017-11-01: 40 mg via ORAL
  Filled 2017-10-31: qty 1

## 2017-10-31 MED ORDER — NITROGLYCERIN 0.4 MG SL SUBL
0.4000 mg | SUBLINGUAL_TABLET | Freq: Once | SUBLINGUAL | Status: AC
  Administered 2017-10-31: 0.4 mg via SUBLINGUAL
  Filled 2017-10-31: qty 1

## 2017-10-31 MED ORDER — RIVAROXABAN 15 MG PO TABS: 15.0000 mg | ORAL_TABLET | Freq: Every evening | ORAL | Status: DC

## 2017-10-31 MED ORDER — HYDROMORPHONE HCL 2 MG PO TABS: 2.0000 mg | ORAL_TABLET | Freq: Every day | ORAL | Status: DC | PRN

## 2017-10-31 MED ORDER — METOPROLOL TARTRATE 5 MG/5ML IV SOLN
5.0000 mg | Freq: Once | INTRAVENOUS | Status: AC
  Administered 2017-10-31: 5 mg via INTRAVENOUS
  Filled 2017-10-31: qty 5

## 2017-10-31 MED ORDER — METOPROLOL TARTRATE 5 MG/5ML IV SOLN
5.0000 mg | Freq: Four times a day (QID) | INTRAVENOUS | Status: DC
  Filled 2017-10-31 (×3): qty 5

## 2017-10-31 MED ORDER — ZOLPIDEM TARTRATE 5 MG PO TABS
5.0000 mg | ORAL_TABLET | Freq: Every day | ORAL | Status: DC
  Administered 2017-11-01: 5 mg via ORAL
  Filled 2017-10-31: qty 1

## 2017-10-31 MED ORDER — SODIUM CHLORIDE 0.9 % IV SOLN: 250.0000 mL | INTRAVENOUS | Status: DC | PRN

## 2017-10-31 MED ORDER — ACETAMINOPHEN 325 MG PO TABS: 650.0000 mg | ORAL_TABLET | Freq: Four times a day (QID) | ORAL | Status: DC | PRN

## 2017-10-31 NOTE — H&P (Addendum)
History and Physical    Katherine Lane:[id number] DOB: [birth year] DOA: 10/31/2017  PCP: Merlyn Albert, MD   Patient coming from: Home  Chief Complaint: Chest pressure  HPI: Katherine Lane is a 84 y.o. female with medical history significant for persistent atrial fibrillation on Xarelto, chronic diastolic CHF, and hypertension who had recently seen her cardiologist Dr. Purvis Sheffield on 9/11 for symptoms of elevated blood pressure and heart rate.  He had given her an extra dose of Toprol-XL 25 mg in the evening and this appeared to have bottomed out her blood pressure, therefore she did not take the rest of her blood pressure medications.  She came to the ED today with onset of chest heaviness with radiation to her back along with some shortness of breath.  Her blood pressure is noted to be elevated and her heart rate is similar to what it was in the office around 100 to 110 feet per minute. She denies any fever, chills, cough, swelling, or other symptoms to include orthopnea or paroxysmal nocturnal dyspnea.  She has taken her medications today as otherwise prescribed.  She does state that she takes magnesium on a daily basis and has developed some diarrhea today.   ED Course: Vital signs are stable with elevated blood pressure readings; telemetry demonstrating some atrial fibrillation with tachycardia.  Troponin is 0.01 and 2 view chest x-ray with no acute findings aside from some cardiomegaly.  BNP is 353.  Prior echocardiogram performed on 06/2016 with EF 65 to 70% and some mild LVH noted.  Review of Systems: All others reviewed and otherwise negative.  Past Medical History:  Diagnosis Date  . A-fib (HCC)   . Anxiety   . Arthritis   . CHF (congestive heart failure) (HCC)   . Chronic back pain   . Constipation   . Constipation, chronic   . Depression   . Family history of adverse reaction to anesthesia    Mother, Sister, daughter- N/V  . History of blood transfusion    "pregnancy"    . HTN (hypertension)   . Insomnia   . Migraine headache   . Osteoporosis   . PONV (postoperative nausea and vomiting)    "violent"  . Pulmonary edema 06/2016  . Sodium (Na) deficiency   . Trigeminal neuralgia     Past Surgical History:  Procedure Laterality Date  . ANKLE FRACTURE SURGERY Right   . CARDIAC CATHETERIZATION     denies  . CATARACT EXTRACTION W/PHACO Right 07/22/2012   Procedure: CATARACT EXTRACTION PHACO AND INTRAOCULAR LENS PLACEMENT (IOC);  Surgeon: Gemma Payor, MD;  Location: [facility];  Service: Ophthalmology;  Laterality: Right;  CDE:  18.93  . CATARACT EXTRACTION W/PHACO Left 08/09/2012   Procedure: CATARACT EXTRACTION PHACO AND INTRAOCULAR LENS PLACEMENT (IOC);  Surgeon: Gemma Payor, MD;  Location: [facility];  Service: Ophthalmology;  Laterality: Left;  CDE: 17.21  . COLONOSCOPY    . HARDWARE REMOVAL  07/18/2011   Procedure: HARDWARE REMOVAL; Right leg-  Surgeon: Vickki Hearing, MD;  Location: [facility];  Service: Orthopedics;  Laterality: Right;  . KYPHOPLASTY N/A 12/26/2016   Procedure: Lumbar two Lumbar three and Lumbar five KYPHOPLASTY;  Surgeon: Maeola Harman, MD;  Location: [facility] OR;  Service: Neurosurgery;  Laterality: N/A;  . KYPHOPLASTY N/A 02/19/2017   Procedure: KYPHOPLASTY LUMBAR ONE;  Surgeon: Maeola Harman, MD;  Location: [facility] OR;  Service: Neurosurgery;  Laterality: N/A;  KYPHOPLASTY LUMBAR ONE  . NERVE REPAIR  07/18/2011   Procedure: NERVE  REPAIR;  Surgeon: Vickki HearingStanley E Harrison, MD;  Location: [facility];  Service: Orthopedics;  Laterality: Right;  Right leg superficial peroneal nerve release    . PARTIAL HYSTERECTOMY       reports that she has never smoked. She has never used smokeless tobacco. She reports that she does not drink alcohol or use drugs.  Allergies  Allergen Reactions  . Amlodipine Swelling    Leg swelling   . Codeine Nausea And Vomiting and Other (See Comments)    Patient states "intolerance to all pain medications"  (NO OPIOIDS) VERY VIOLENT  VOMITING!!  . Other Nausea And Vomiting and Other (See Comments)    general anesthesia  . Tramadol Nausea And Vomiting and Other (See Comments)    "NO OPIOIDS!!! VERY VIOLENT VOMITING!!" per Med History prior to 02/18/17    Family History  Problem Relation Age of Onset  . Hypertension Mother   . Alzheimer's disease Mother   . Cancer Mother   . Hypertension Father   . Other Father        abused pain meds  . Diabetes Brother   . Other Brother        back problems  . Diabetes Daughter   . Anesthesia problems Neg Hx   . Hypotension Neg Hx   . Malignant hyperthermia Neg Hx   . Pseudochol deficiency Neg Hx     Prior to Admission medications   Medication Sig Start Date End Date Taking? Authorizing Provider  ANALPRAM-HC 1-1 % rectal cream APPLY TO AFFECTED AREAS TWICE DAILY. Patient taking differently: Place 1 application rectally 2 (two) times daily as needed for hemorrhoids or anal itching.  07/01/16  Yes Adline PotterGriffin, Jennifer A, NP  Carboxymethylcellul-Glycerin (LUBRICATING EYE DROPS OP) Place 1 drop into both eyes 3 (three) times daily.    Yes [provider]  hydrALAZINE (APRESOLINE) 25 MG tablet Take 25 mg by mouth 2 (two) times daily.  09/22/17  Yes [provider]  HYDROmorphone (DILAUDID) 2 MG tablet Take 2 mg by mouth daily as needed for moderate pain.  08/19/17  Yes [provider]  lidocaine (XYLOCAINE) 5 % ointment Apply 1 application topically 3 (three) times daily as needed. 10/27/17  Yes Cyril MourningGriffin, Jennifer A, NP  LORazepam (ATIVAN) 0.5 MG tablet One up to BID Patient taking differently: Take 0.5 mg by mouth See admin instructions. Pt takes 1 in am  and takes 1 every evening. *Takes as needed 02/12/17  Yes Merlyn AlbertLuking, William S, MD  losartan (COZAAR) 50 MG tablet Take 50 mg by mouth 2 (two) times daily.   Yes [provider]  metoprolol succinate (TOPROL XL) 25 MG 24 hr tablet Take 1 tablet (25 mg total) by mouth daily. Take in addition to the 50 mg  tablet daily for a total of 75 mg daily Patient taking differently: Take 75 mg by mouth daily. *Take an additional 25mg  once daily as needed for heart rate/A-FIB 06/25/17  Yes Laqueta LindenKoneswaran, Suresh A, MD  pantoprazole (PROTONIX) 40 MG tablet TAKE 1 TABLET BY MOUTH ONCE A DAY. Patient taking differently: Take 40 mg by mouth daily as needed (for acid reflux).  09/25/17  Yes Laqueta LindenKoneswaran, Suresh A, MD  polyethylene glycol powder (MIRALAX) powder Take 17 g daily as needed by mouth for moderate constipation.    Yes [provider]  rivaroxaban (XARELTO) 20 MG TABS tablet TAKE 1 TABLET DAILY WITH SUPPER. Patient taking differently: Take 20 mg by mouth every evening. . 06/25/17  Yes Laqueta LindenKoneswaran, Suresh A,  MD  zolpidem (AMBIEN) 10 MG tablet TAKE 1 TABLET BY MOUTH AT BEDTIME FOR SLEEP. Patient taking differently: Take 10 mg by mouth at bedtime.  05/29/17  Yes Merlyn Albert, MD    Physical Exam: Vitals:   10/31/17 2036 10/31/17 2100 10/31/17 2130 10/31/17 2200  BP:  (!) 149/85 (!) 158/80 (!) 159/107  Pulse:  (!) 133 75 77  Resp:  14 13 13   Temp:      TempSrc:      SpO2:  96% 94% 95%  Weight: 63.5 kg     Height: 5\' 2"  (1.575 m)       Constitutional: NAD, calm, comfortable Vitals:   10/31/17 2036 10/31/17 2100 10/31/17 2130 10/31/17 2200  BP:  (!) 149/85 (!) 158/80 (!) 159/107  Pulse:  (!) 133 75 77  Resp:  14 13 13   Temp:      TempSrc:      SpO2:  96% 94% 95%  Weight: 63.5 kg     Height: 5\' 2"  (1.575 m)      Eyes: lids and conjunctivae normal ENMT: Mucous membranes are moist.  Neck: normal, supple Respiratory: clear to auscultation bilaterally. Normal respiratory effort. No accessory muscle use.  Cardiovascular: Irregular rate and rhythm, no murmurs. No extremity edema. Abdomen: no tenderness, no distention. Bowel sounds positive.  Musculoskeletal:  No joint deformity upper and lower extremities.   Skin: no rashes, lesions, ulcers.  Psychiatric: Normal judgment and insight. Alert and  oriented x 3. Normal mood.   Labs on Admission: I have personally reviewed following labs and imaging studies  CBC: Recent Labs  Lab 10/31/17 2046  WBC 6.2  HGB 13.1  HCT 40.5  MCV 90.8  PLT 299   Basic Metabolic Panel: Recent Labs  Lab 10/31/17 2046  NA 142  K 3.6  CL 105  CO2 26  GLUCOSE 137*  BUN 16  CREATININE 0.94  CALCIUM 9.2   GFR: Estimated Creatinine Clearance: 40.4 mL/min (by C-G formula based on SCr of 0.94 mg/dL). Liver Function Tests: No results for input(s): AST, ALT, ALKPHOS, BILITOT, PROT, ALBUMIN in the last 168 hours. No results for input(s): LIPASE, AMYLASE in the last 168 hours. No results for input(s): AMMONIA in the last 168 hours. Coagulation Profile: No results for input(s): INR, PROTIME in the last 168 hours. Cardiac Enzymes: No results for input(s): CKTOTAL, CKMB, CKMBINDEX, TROPONINI in the last 168 hours. BNP (last 3 results) No results for input(s): PROBNP in the last 8760 hours. HbA1C: No results for input(s): HGBA1C in the last 72 hours. CBG: No results for input(s): GLUCAP in the last 168 hours. Lipid Profile: No results for input(s): CHOL, HDL, LDLCALC, TRIG, CHOLHDL, LDLDIRECT in the last 72 hours. Thyroid Function Tests: No results for input(s): TSH, T4TOTAL, FREET4, T3FREE, THYROIDAB in the last 72 hours. Anemia Panel: No results for input(s): VITAMINB12, FOLATE, FERRITIN, TIBC, IRON, RETICCTPCT in the last 72 hours. Urine analysis:    Component Value Date/Time   COLORURINE STRAW (A) 07/10/2016 1200   APPEARANCEUR CLEAR 07/10/2016 1200   APPEARANCEUR Cloudy (A) 06/20/2016 1400   LABSPEC 1.004 (L) 07/10/2016 1200   PHURINE 6.0 07/10/2016 1200   GLUCOSEU NEGATIVE 07/10/2016 1200   HGBUR SMALL (A) 07/10/2016 1200   BILIRUBINUR NEGATIVE 07/10/2016 1200   BILIRUBINUR Negative 06/20/2016 1400   KETONESUR NEGATIVE 07/10/2016 1200   PROTEINUR NEGATIVE 07/10/2016 1200   UROBILINOGEN 0.2 03/17/2016 1043   UROBILINOGEN 0.2  02/15/2009 1217   NITRITE NEGATIVE 07/10/2016 1200  LEUKOCYTESUR Trace (A) 01/07/2017 1447   LEUKOCYTESUR 3+ (A) 06/20/2016 1400    Radiological Exams on Admission: Dg Chest 2 View  Result Date: 10/31/2017 CLINICAL DATA:  Chest pain.  Back pain. EXAM: CHEST - 2 VIEW COMPARISON:  07/11/2016 FINDINGS: Lateral view degraded by patient arm position. Osteopenia. Upper lumbar vertebral augmentation. Midline trachea. Mild cardiomegaly. Atherosclerosis in the transverse aorta. No pleural effusion or pneumothorax. Mildly low lung volumes. No congestive failure. Increased density in the right lower lung on the frontal radiograph is not correlated on the lateral and could represent calcified granuloma or embolization of cement from vertebral augmentation. This is unchanged. No lobar consolidation. IMPRESSION: No acute cardiopulmonary disease. Cardiomegaly without congestive failure. Aortic Atherosclerosis (ICD10-I70.0). Electronically Signed   By: Jeronimo Greaves M.D.   On: 10/31/2017 21:23    EKG: Independently reviewed.  Atrial fibrillation 107 bpm.  Assessment/Plan Principal Problem:   Atypical chest pain Active Problems:   Essential hypertension   Atrial fibrillation (HCC)   Chronic diastolic CHF (congestive heart failure) (HCC)    1. Atypical chest pain.  This is likely related to her elevated blood pressure as well as poor rate control.  Will push IV metoprolol 5mg  q6 hours for 4 doses and maintain on her usual home medications and monitor with hydralazine pushes as needed for blood pressure control.  Consider addition of Toprol-XL at 12.5 mg in addition to her usual daily dose should blood pressure and heart rate not be adequately controlled.  She appears to have had hypotension from an extra 25 mg dose in the office.  Monitor on telemetry for now.  Continue to trend troponins and check 2D echocardiogram in a.m.  Check serum magnesium as well as repeat labs in a.m. 2. Essential hypertension.   Continue home blood pressure medications, and monitor.  Hydralazine pushes as needed. 3. Atrial fibrillation.  Continue Xarelto and monitor carefully on telemetry.  Patient does not need any IV drip at this time. 4. Chronic diastolic CHF.  No signs of exacerbation noted at this time.  Will monitor daily weights and recheck 2D echocardiogram.   DVT prophylaxis: Xarelto Code Status: Full Family Communication: Daughter at bedside Disposition Plan: Rule out ACS and control blood pressure Consults called: None Admission status: Observation, telemetry   Pattijo Juste Hoover Brunette DO [facility] Pager [phone]  If 7PM-7AM, please contact night-coverage [email or web address] Password [email or web address]  10/31/2017, 11:18 PM

## 2017-10-31 NOTE — ED Notes (Signed)
Pt updated on plan of care, Dr Sherryll BurgerShah at bedside,

## 2017-10-31 NOTE — ED Triage Notes (Signed)
Pt c/o chest heaviness, back pain and SOB x 1 hour, pt has hx of A-fib, saw cardiologist Wed and was instructed to take additional Metoprolol beginning Wed, family states BP shot up to 168/112 with HR of 119 prior to arrival

## 2017-10-31 NOTE — ED Notes (Signed)
Last 2 BP reported to Dr. Sherryll BurgerShah, no additional orders

## 2017-10-31 NOTE — ED Notes (Signed)
Lab at bedside for blood work.

## 2017-10-31 NOTE — ED Provider Notes (Signed)
Emergency Department Provider Note   I have reviewed the triage vital signs and the nursing notes.   HISTORY  Chief Complaint Chest Pain   HPI Katherine Lane is a 84 y.o. female with PMH of A-fib, CHF, and HTN to the emergency department for evaluation of sudden onset chest heaviness with radiation to the back, shortness of breath.  Patient states that with the above symptoms she is noticed elevated blood pressure and heart rate.  She saw her Cardiologist, Dr. Purvis Sheffield, on 9/11 for the same. No CP at that time but found to have A-fib with RVR.  She took an additional dose of metoprolol and found that her heart rate improved.  Patient notes episodes of blood pressure "bottoming out" and HR becoming suddenly elevated but denies any CP. No fever or chills. No productive cough.   Past Medical History:  Diagnosis Date  . A-fib (HCC)   . Anxiety   . Arthritis   . CHF (congestive heart failure) (HCC)   . Chronic back pain   . Constipation   . Constipation, chronic   . Depression   . Family history of adverse reaction to anesthesia    Mother, Sister, daughter- N/V  . History of blood transfusion    "pregnancy"  . HTN (hypertension)   . Insomnia   . Migraine headache   . Osteoporosis   . PONV (postoperative nausea and vomiting)    "violent"  . Pulmonary edema 06/2016  . Sodium (Na) deficiency   . Trigeminal neuralgia     Patient Active Problem List   Diagnosis Date Noted  . Chronic diastolic CHF (congestive heart failure) (HCC) 10/31/2017  . Atypical chest pain 10/31/2017  . Compression fracture of L1 lumbar vertebra (HCC) 02/19/2017  . Lumbar compression fracture (HCC) 12/26/2016  . Gastroesophageal reflux disease without esophagitis 07/18/2016  . Acute on chronic diastolic CHF (congestive heart failure) (HCC) 07/11/2016  . Chronic coughing 07/11/2016  . Pulmonary edema 07/10/2016  . Migraine headache 07/01/2016  . Blurred vision, bilateral 07/01/2016  . Anxiety  06/30/2016  . Altered mental status 12/06/2015  . Hyponatremia 12/06/2015  . Hypokalemia 12/06/2015  . Dehydration 12/06/2015  . Prolonged Q-T interval on ECG 12/06/2015  . Postural dizziness with presyncope 12/06/2015  . Atrial fibrillation (HCC) 07/14/2013  . Atrial flutter (HCC) 05/12/2013  . Bulging discs 04/20/2013  . Trigeminal neuralgia 01/15/2013  . Cellulitis 07/31/2011  . Mechanical complication of internal orthopedic implant (HCC) 07/21/2011  . 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 Ankle fracture 07/02/2010  . LESION OF PLANTAR NERVE 05/14/2009  . CLOSED BIMALLEOLAR FRACTURE 03/01/2009  . Essential hypertension 11/07/2008  . CONSTIPATION, CHRONIC 11/07/2008  . HEMATOCHEZIA 11/07/2008  . Osteoporosis 11/07/2008    Past Surgical History:  Procedure Laterality Date  . ANKLE FRACTURE SURGERY Right   . CARDIAC CATHETERIZATION     denies  . CATARACT EXTRACTION W/PHACO Right 07/22/2012   Procedure: CATARACT EXTRACTION PHACO AND INTRAOCULAR LENS PLACEMENT (IOC);  Surgeon: Gemma Payor, MD;  Location: [facility];  Service: Ophthalmology;  Laterality: Right;  CDE:  18.93  . CATARACT EXTRACTION W/PHACO Left 08/09/2012   Procedure: CATARACT EXTRACTION PHACO AND INTRAOCULAR LENS PLACEMENT (IOC);  Surgeon: Gemma Payor, MD;  Location: [facility];  Service: Ophthalmology;  Laterality: Left;  CDE: 17.21  . COLONOSCOPY    . HARDWARE REMOVAL  07/18/2011   Procedure: HARDWARE REMOVAL; Right leg-  Surgeon: Vickki Hearing, MD;  Location: [facility];  Service: Orthopedics;  Laterality: Right;  .  KYPHOPLASTY N/A 12/26/2016   Procedure: Lumbar two Lumbar three and Lumbar five KYPHOPLASTY;  Surgeon: Maeola HarmanStern, Joseph, MD;  [facility];  Service: Neurosurgery;  Laterality: N/A;  . KYPHOPLASTY N/A 02/19/2017   Procedure: KYPHOPLASTY LUMBAR ONE;  Surgeon: Maeola HarmanStern, Joseph, MD;  [facility];  Service: Neurosurgery;  Laterality: N/A;  KYPHOPLASTY LUMBAR ONE  . NERVE REPAIR  07/18/2011   Procedure:  NERVE REPAIR;  Surgeon: Vickki HearingStanley E Harrison, MD;  Location: [facility];  Service: Orthopedics;  Laterality: Right;  Right leg superficial peroneal nerve release    . PARTIAL HYSTERECTOMY     Allergies Amlodipine; Codeine; Other; and Tramadol  Family History  Problem Relation Age of Onset  . Hypertension Mother   . Alzheimer's disease Mother   . Cancer Mother   . Hypertension Father   . Other Father        abused pain meds  . Diabetes Brother   . Other Brother        back problems  . Diabetes Daughter   . Anesthesia problems Neg Hx   . Hypotension Neg Hx   . Malignant hyperthermia Neg Hx   . Pseudochol deficiency Neg Hx     Social History Social History   Tobacco Use  . Smoking status: Never Smoker  . Smokeless tobacco: Never Used  Substance Use Topics  . Alcohol use: No    Alcohol/week: 0.0 standard drinks  . Drug use: No    Review of Systems  Constitutional: No fever/chills Eyes: No visual changes. ENT: No sore throat. Cardiovascular: Positive chest pain and elevated BP.  Respiratory: Positive shortness of breath. Gastrointestinal: No abdominal pain.  No nausea, no vomiting.  No diarrhea.  No constipation. Genitourinary: Negative for dysuria. Musculoskeletal: Negative for back pain. Skin: Negative for rash. Neurological: Negative for headaches, focal weakness or numbness.  10-point ROS otherwise negative.  ____________________________________________   PHYSICAL EXAM:  VITAL SIGNS: Vitals:   10/31/17 2130 10/31/17 2200  BP: (!) 158/80 (!) 159/107  Pulse: 75 77  Resp: 13 13  Temp:    SpO2: 94% 95%     Constitutional: Alert and oriented. Well appearing and in no acute distress. Eyes: Conjunctivae are normal.  Head: Atraumatic. Nose: No congestion/rhinnorhea. Mouth/Throat: Mucous membranes are moist.  Oropharynx non-erythematous. Neck: No stridor.   Cardiovascular: A-fib. Good peripheral circulation. Grossly normal heart sounds.   Respiratory: Normal  respiratory effort.  No retractions. Lungs CTAB. Gastrointestinal: Soft and nontender. No distention.  Musculoskeletal: No lower extremity tenderness nor edema. No gross deformities of extremities. Neurologic:  Normal speech and language. No gross focal neurologic deficits are appreciated.  Skin:  Skin is warm, dry and intact. No rash noted.  ____________________________________________   LABS (all labs ordered are listed, but only abnormal results are displayed)  Labs Reviewed  BASIC METABOLIC PANEL - Abnormal; Notable for the following components:      Result Value   Glucose, Bld 137 (*)    GFR calc non Af Amer 55 (*)    All other components within normal limits  BRAIN NATRIURETIC PEPTIDE - Abnormal; Notable for the following components:   B Natriuretic Peptide 353.0 (*)    All other components within normal limits  CBC  I-STAT TROPONIN, ED   ____________________________________________  EKG   EKG Interpretation  Date/Time:  Saturday October 31 2017 20:35:40 EDT Ventricular Rate:  107 PR Interval:    QRS Duration: 88 QT Interval:  363 QTC Calculation: 485 R Axis:   86 Text Interpretation:  Atrial fibrillation Borderline right axis deviation Borderline ST depression, anterolateral leads No STEMI.  Confirmed by Alona Bene [phone]) on 10/31/2017 8:37:31 PM       ____________________________________________  RADIOLOGY  Dg Chest 2 View  Result Date: 10/31/2017 CLINICAL DATA:  Chest pain.  Back pain. EXAM: CHEST - 2 VIEW COMPARISON:  07/11/2016 FINDINGS: Lateral view degraded by patient arm position. Osteopenia. Upper lumbar vertebral augmentation. Midline trachea. Mild cardiomegaly. Atherosclerosis in the transverse aorta. No pleural effusion or pneumothorax. Mildly low lung volumes. No congestive failure. Increased density in the right lower lung on the frontal radiograph is not correlated on the lateral and could represent calcified granuloma or embolization of cement  from vertebral augmentation. This is unchanged. No lobar consolidation. IMPRESSION: No acute cardiopulmonary disease. Cardiomegaly without congestive failure. Aortic Atherosclerosis (ICD10-I70.0). Electronically Signed   By: Jeronimo Greaves M.D.   On: 10/31/2017 21:23    ____________________________________________   PROCEDURES  Procedure(s) performed:   Procedures  None ____________________________________________   INITIAL IMPRESSION / ASSESSMENT AND PLAN / ED COURSE  Pertinent labs & imaging results that were available during my care of the patient were reviewed by me and considered in my medical decision making (see chart for details).  Patient presents to the emergency department with chest pressure and shortness of breath.  She has A. fib on EKG with heart rate into the low 100s.  She is hypertensive.  No hypoxemia. Doubt PE. Plan for Metoprolol IV and labs including BNP and CXR.   CXR and labs reviewed with no acute findings. Patient give Metoprolol and Nitro and is not chest pain free. Plan for obs admit for further chest pain evaluation.   Discussed patient's case with Hospitalist, Dr. Sherryll Burger to request admission. Patient and family (if present) updated with plan. Care transferred to Hospitalist service.  I reviewed all nursing notes, vitals, pertinent old records, EKGs, labs, imaging (as available).  ____________________________________________  FINAL CLINICAL IMPRESSION(S) / ED DIAGNOSES  Final diagnoses:  Precordial chest pain    MEDICATIONS GIVEN DURING THIS VISIT:  Medications  metoprolol tartrate (LOPRESSOR) injection 5 mg (5 mg Intravenous Given 10/31/17 2051)  nitroGLYCERIN (NITROSTAT) SL tablet 0.4 mg (0.4 mg Sublingual Given 10/31/17 2216)    Note:  This document was prepared using Dragon voice recognition software and may include unintentional dictation errors.  Alona Bene, MD Emergency Medicine    Long, Arlyss Repress, MD 10/31/17 [phone]

## 2017-11-01 ENCOUNTER — Observation Stay (HOSPITAL_BASED_OUTPATIENT_CLINIC_OR_DEPARTMENT_OTHER): Payer: Medicare Other

## 2017-11-01 DIAGNOSIS — I351 Nonrheumatic aortic (valve) insufficiency: Secondary | ICD-10-CM

## 2017-11-01 DIAGNOSIS — I361 Nonrheumatic tricuspid (valve) insufficiency: Secondary | ICD-10-CM

## 2017-11-01 DIAGNOSIS — R0789 Other chest pain: Secondary | ICD-10-CM | POA: Diagnosis not present

## 2017-11-01 LAB — BASIC METABOLIC PANEL
Anion gap: 6 (ref 5–15)
BUN: 14 mg/dL (ref 8–23)
CO2: 25 mmol/L (ref 22–32)
Calcium: 8.6 mg/dL — ABNORMAL LOW (ref 8.9–10.3)
Chloride: 111 mmol/L (ref 98–111)
Creatinine, Ser: 0.74 mg/dL (ref 0.44–1.00)
GFR calc Af Amer: >60 mL/min (ref >60)
GFR calc non Af Amer: >60 mL/min (ref >60)
Glucose, Bld: 96 mg/dL (ref 70–99)
Potassium: 3.7 mmol/L (ref 3.5–5.1)
Sodium: 142 mmol/L (ref 135–145)

## 2017-11-01 LAB — TROPONIN I
Troponin I: <0.03 ng/mL (ref <0.03)
Troponin I: <0.03 ng/mL (ref <0.03)
Troponin I: <0.03 ng/mL (ref <0.03)

## 2017-11-01 LAB — CBC
HCT: 37.2 % (ref 36.0–46.0)
Hemoglobin: 11.9 g/dL — ABNORMAL LOW (ref 12.0–15.0)
MCH: 29 pg (ref 26.0–34.0)
MCHC: 32 g/dL (ref 30.0–36.0)
MCV: 90.7 fL (ref 78.0–100.0)
Platelets: 269 10*3/uL (ref 150–400)
RBC: 4.1 MIL/uL (ref 3.87–5.11)
RDW: 13.9 % (ref 11.5–15.5)
WBC: 5.1 10*3/uL (ref 4.0–10.5)

## 2017-11-01 LAB — MAGNESIUM: Magnesium: 2.3 mg/dL (ref 1.7–2.4)

## 2017-11-01 LAB — ECHOCARDIOGRAM COMPLETE
Height: 62 in
Weight: 2229.29 oz

## 2017-11-01 MED ORDER — METOPROLOL SUCCINATE ER 50 MG PO TB24
75.0000 mg | ORAL_TABLET | Freq: Once | ORAL | Status: AC
  Administered 2017-11-01: 75 mg via ORAL
  Filled 2017-11-01: qty 1

## 2017-11-01 MED ORDER — METOPROLOL TARTRATE 25 MG PO TABS
25.0000 mg | ORAL_TABLET | Freq: Once | ORAL | Status: DC
  Filled 2017-11-01: qty 1

## 2017-11-01 MED ORDER — METOPROLOL SUCCINATE ER 50 MG PO TB24: 50.0000 mg | ORAL_TABLET | Freq: Every day | ORAL | 11 refills | Status: DC

## 2017-11-01 MED ORDER — METOPROLOL SUCCINATE ER 50 MG PO TB24: 75.0000 mg | ORAL_TABLET | Freq: Every day | ORAL | Status: DC

## 2017-11-01 MED ORDER — POLYVINYL ALCOHOL 1.4 % OP SOLN
1.0000 [drp] | Freq: Three times a day (TID) | OPHTHALMIC | Status: DC
  Administered 2017-11-01: 1 [drp] via OPHTHALMIC
  Filled 2017-11-01: qty 15

## 2017-11-01 MED ORDER — LOSARTAN POTASSIUM 50 MG PO TABS: 50.0000 mg | ORAL_TABLET | Freq: Two times a day (BID) | ORAL | 2 refills | Status: DC

## 2017-11-01 MED ORDER — METOPROLOL TARTRATE 25 MG PO TABS: 25.0000 mg | ORAL_TABLET | Freq: Two times a day (BID) | ORAL | 11 refills | Status: DC | PRN

## 2017-11-01 MED ORDER — HYDRALAZINE HCL 50 MG PO TABS: 50.0000 mg | ORAL_TABLET | Freq: Two times a day (BID) | ORAL | 2 refills | Status: DC

## 2017-11-01 MED ORDER — METOPROLOL SUCCINATE ER 25 MG PO TB24: 25.0000 mg | ORAL_TABLET | Freq: Every day | ORAL | 3 refills | Status: DC

## 2017-11-01 NOTE — Progress Notes (Signed)
Patients BP 151/88 HR 49. Notified Dr. Sherryll BurgerShah to make aware, new order to hold 6am dose of metoprolol. Patient denies any "chest heaviness" as stated on admission. Will continue to monitor.

## 2017-11-01 NOTE — Progress Notes (Signed)
*  PRELIMINARY RESULTS* Echocardiogram 2D Echocardiogram has been performed.  Jeryl Columbialliott, Mirtie Bastyr 11/01/2017, 9:52 AM

## 2017-11-01 NOTE — Discharge Instructions (Signed)
1)Take metoprolol SUCCINATE/Toprol-XL   1 tablet (25 mg )   in addition to the 50 mg tablet daily for a total of 75 mg daily for blood pressure and heart rate 2)Take Metoprolol TARTRATE  25 mg every 12 hours as needed for palpitations or fast heart rate 3) increase hydralazine to 50 mg twice a day from 25 mg for better blood pressure control 4) you are taking Xarelto for blood thinner so Avoid ibuprofen/Advil/Aleve/Motrin/Goody Powders/Naproxen/BC powders/Meloxicam/Diclofenac/Indomethacin and other Nonsteroidal anti-inflammatory medications as these will make you more likely to bleed and can cause stomach ulcers, can also cause Kidney problems.  5) follow-up with your cardiologist within a week for reevaluation, follow-up sooner if chest pains, persistent dizziness or shortness of breath 6) check your blood pressure and your heart rate at least once a day, keep a diary, take this diary with you when you next see your doctor 7)Very low-salt diet advised 8)Weigh yourself daily, call if you gain more than 3 pounds in 1 day or more than 5 pounds in 1 week as your diuretic medications may need to be adjusted   Please note  You were cared for by a hospitalist Physician during your hospital stay. Once you are discharged, your primary care physician will handle any further medical issues. Please note that NO REFILLS for any discharge medications will be authorized once you are discharged, as it is imperative that you return to your primary care physician (or establish a relationship with a primary care physician if you do not have one) for your aftercare needs so that they can reassess your need for medications and monitor your lab values

## 2017-11-01 NOTE — Progress Notes (Signed)
Katherine RicksBetty M Lane discharged Home per MD order.  Discharge instructions reviewed and discussed with the patient, all questions and concerns answered. Copy of instructions and scripts given to patient.   Patients skin is clean, dry and intact, no evidence of skin break down. IV site discontinued and catheter remains intact. Site without signs and symptoms of complications. Dressing and pressure applied.  Patient escorted to the elevator  no distress noted upon discharge.  Rica KoyanagiBonnie M Bronte Kropf 11/01/2017 4:26 PM

## 2017-11-01 NOTE — Discharge Summary (Signed)
Katherine Lane, is a 84 y.o. female  DOB 07-21-1932  MRN [id number].  Admission date:  10/31/2017  Admitting Physician  Pratik Hoover Brunette, DO  Discharge Date:  11/01/2017   Primary MD  Merlyn Albert, MD  Recommendations for primary care physician for things to follow:   1)Take metoprolol SUCCINATE/Toprol-XL   1 tablet (25 mg )   in addition to the 50 mg tablet daily for a total of 75 mg daily for blood pressure and heart rate 2)Take Metoprolol TARTRATE  25 mg every 12 hours as needed for palpitations or fast heart rate 3) increase hydralazine to 50 mg twice a day from 25 mg for better blood pressure control 4) you are taking Xarelto for blood thinner so Avoid ibuprofen/Advil/Aleve/Motrin/Goody Powders/Naproxen/BC powders/Meloxicam/Diclofenac/Indomethacin and other Nonsteroidal anti-inflammatory medications as these will make you more likely to bleed and can cause stomach ulcers, can also cause Kidney problems.  5) follow-up with your cardiologist within a week for reevaluation, follow-up sooner if chest pains, persistent dizziness or shortness of breath 6) check your blood pressure and your heart rate at least once a day, keep a diary, take this diary with you when you next see your doctor 7)Very low-salt diet advised 8)Weigh yourself daily, call if you gain more than 3 pounds in 1 day or more than 5 pounds in 1 week as your diuretic medications may need to be adjusted  Please note  You were cared for by a hospitalist Physician during your hospital stay. Once you are discharged, your primary care physician will handle any further medical issues. Please note that NO REFILLS for any discharge medications will be authorized once you are discharged, as it is imperative that you return to your primary care physician (or establish a relationship with a primary care physician if you do not have one) for your aftercare  needs so that they can reassess your need for medications and monitor your lab values   Admission Diagnosis  Precordial chest pain [R07.2]   Discharge Diagnosis  Precordial chest pain [R07.2]    Principal Problem:   Atypical chest pain Active Problems:   Essential hypertension   Atrial flutter (HCC)   Atrial fibrillation (HCC)   Chronic diastolic CHF (congestive heart failure) (HCC)      Past Medical History:  Diagnosis Date  . A-fib (HCC)   . Anxiety   . Arthritis   . CHF (congestive heart failure) (HCC)   . Chronic back pain   . Constipation   . Constipation, chronic   . Depression   . Family history of adverse reaction to anesthesia    Mother, Sister, daughter- N/V  . History of blood transfusion    "pregnancy"  . HTN (hypertension)   . Insomnia   . Migraine headache   . Osteoporosis   . PONV (postoperative nausea and vomiting)    "violent"  . Pulmonary edema 06/2016  . Sodium (Na) deficiency   . Trigeminal neuralgia     Past Surgical History:  Procedure Laterality  Date  . ANKLE FRACTURE SURGERY Right   . CARDIAC CATHETERIZATION     denies  . CATARACT EXTRACTION W/PHACO Right 07/22/2012   Procedure: CATARACT EXTRACTION PHACO AND INTRAOCULAR LENS PLACEMENT (IOC);  Surgeon: Gemma Payor, MD;  Location: [facility];  Service: Ophthalmology;  Laterality: Right;  CDE:  18.93  . CATARACT EXTRACTION W/PHACO Left 08/09/2012   Procedure: CATARACT EXTRACTION PHACO AND INTRAOCULAR LENS PLACEMENT (IOC);  Surgeon: Gemma Payor, MD;  Location: [facility];  Service: Ophthalmology;  Laterality: Left;  CDE: 17.21  . COLONOSCOPY    . HARDWARE REMOVAL  07/18/2011   Procedure: HARDWARE REMOVAL; Right leg-  Surgeon: Vickki Hearing, MD;  Location: [facility];  Service: Orthopedics;  Laterality: Right;  . KYPHOPLASTY N/A 12/26/2016   Procedure: Lumbar two Lumbar three and Lumbar five KYPHOPLASTY;  Surgeon: Maeola Harman, MD;  Location: [facility] OR;  Service: Neurosurgery;  Laterality: N/A;  . KYPHOPLASTY  N/A 02/19/2017   Procedure: KYPHOPLASTY LUMBAR ONE;  Surgeon: Maeola Harman, MD;  Location: [facility] OR;  Service: Neurosurgery;  Laterality: N/A;  KYPHOPLASTY LUMBAR ONE  . NERVE REPAIR  07/18/2011   Procedure: NERVE REPAIR;  Surgeon: Vickki Hearing, MD;  Location: [facility];  Service: Orthopedics;  Laterality: Right;  Right leg superficial peroneal nerve release    . PARTIAL HYSTERECTOMY         HPI  from the history and physical done on the day of admission:    Patient coming from: Home  Chief Complaint: Chest pressure  HPI: Katherine Lane is a 84 y.o. female with medical history significant for persistent atrial fibrillation on Xarelto, chronic diastolic CHF, and hypertension who had recently seen her cardiologist Dr. Purvis Sheffield on 9/11 for symptoms of elevated blood pressure and heart rate.  He had given her an extra dose of Toprol-XL 25 mg in the evening and this appeared to have bottomed out her blood pressure, therefore she did not take the rest of her blood pressure medications.  She came to the ED today with onset of chest heaviness with radiation to her back along with some shortness of breath.  Her blood pressure is noted to be elevated and her heart rate is similar to what it was in the office around 100 to 110 feet per minute. She denies any fever, chills, cough, swelling, or other symptoms to include orthopnea or paroxysmal nocturnal dyspnea.  She has taken her medications today as otherwise prescribed.  She does state that she takes magnesium on a daily basis and has developed some diarrhea today.   ED Course: Vital signs are stable with elevated blood pressure readings; telemetry demonstrating some atrial fibrillation with tachycardia.  Troponin is 0.01 and 2 view chest x-ray with no acute findings aside from some cardiomegaly.  BNP is 353.  Prior echocardiogram performed on 06/2016 with EF 65 to 70% and some mild LVH noted.   Hospital Course:   1)Atypical Chest Pain-resolved chest  pain, ruled out for ACS by EKGs and serial troponins, echocardiogram without significant regional wall motion abnormalities, follow-up with cardiologist as outpatient, continue current regimen including Toprol-XL and Xarelto  2)chronic Atrial fibrillation--stable, patient with episodes of RVR with palpitations and dizziness, okay to take metoprolol succinate 75 mg daily for rate control, may use metoprolol tartrate 25 mg every 12 hours as needed for palpitations or RVR, continue Xarelto for anticoagulation.  Echocardiogram with preserved EF of 65 to 70% with regional wall motion abnormalities  3)HFpEF--- patient with history of chronic diastolic dysfunction  CHF,  last known EF 65 to 70%, clinically appears euvolemic and compensated,  4)HTN--BP was not at goal, increase hydralazine to 50 mg 3 times daily, will not increase scheduled metoprolol XL beyond 75 mg daily due to concerns about bradycardia  Discharge Condition: stable  Follow UP--- with cardiologist in less than a week   Diet and Activity recommendation:  As advised  Discharge Instructions    Discharge Instructions    (HEART FAILURE PATIENTS) Call MD:  Anytime you have any of the following symptoms: 1) 3 pound weight gain in 24 hours or 5 pounds in 1 week 2) shortness of breath, with or without a dry hacking cough 3) swelling in the hands, feet or stomach 4) if you have to sleep on extra pillows at night in order to breathe.   Complete by:  As directed    Call MD for:  difficulty breathing, headache or visual disturbances   Complete by:  As directed    Call MD for:  persistant dizziness or light-headedness   Complete by:  As directed    Call MD for:  persistant nausea and vomiting   Complete by:  As directed    Call MD for:  severe uncontrolled pain   Complete by:  As directed    Call MD for:  temperature >100.4   Complete by:  As directed    Diet - low sodium heart healthy   Complete by:  As directed    Discharge instructions    Complete by:  As directed    1)Take metoprolol SUCCINATE/Toprol-XL   1 tablet (25 mg )   in addition to the 50 mg tablet daily for a total of 75 mg daily for blood pressure and heart rate 2)Take Metoprolol TARTRATE  25 mg every 12 hours as needed for palpitations or fast heart rate 3) increase hydralazine to 50 mg twice a day from 25 mg for better blood pressure control 4) you are taking Xarelto for blood thinner so Avoid ibuprofen/Advil/Aleve/Motrin/Goody Powders/Naproxen/BC powders/Meloxicam/Diclofenac/Indomethacin and other Nonsteroidal anti-inflammatory medications as these will make you more likely to bleed and can cause stomach ulcers, can also cause Kidney problems.  5) follow-up with your cardiologist within a week for reevaluation, follow-up sooner if chest pains, persistent dizziness or shortness of breath 6) check your blood pressure and your heart rate at least once a day, keep a diary, take this diary with you when you next see your doctor 7)Very low-salt diet advised 8)Weigh yourself daily, call if you gain more than 3 pounds in 1 day or more than 5 pounds in 1 week as your diuretic medications may need to be adjusted   Please note  You were cared for by a hospitalist Physician during your hospital stay. Once you are discharged, your primary care physician will handle any further medical issues. Please note that NO REFILLS for any discharge medications will be authorized once you are discharged, as it is imperative that you return to your primary care physician (or establish a relationship with a primary care physician if you do not have one) for your aftercare needs so that they can reassess your need for medications and monitor your lab values   Increase activity slowly   Complete by:  As directed        Discharge Medications     Allergies as of 11/01/2017      Reactions   Amlodipine Swelling   Leg swelling   Codeine Nausea And Vomiting, Other (See Comments)  Patient  states "intolerance to all pain medications"  (NO OPIOIDS) VERY VIOLENT VOMITING!!   Other Nausea And Vomiting, Other (See Comments)   general anesthesia   Tramadol Nausea And Vomiting, Other (See Comments)   "NO OPIOIDS!!! VERY VIOLENT VOMITING!!" per Med History prior to 02/18/17      Medication List    TAKE these medications   ANALPRAM-HC 1-1 % rectal cream Generic drug:  pramoxine-hydrocortisone APPLY TO AFFECTED AREAS TWICE DAILY. What changed:  See the new instructions.   hydrALAZINE 50 MG tablet Commonly known as:  APRESOLINE Take 1 tablet (50 mg total) by mouth 2 (two) times daily. What changed:    medication strength  how much to take   HYDROmorphone 2 MG tablet Commonly known as:  DILAUDID Take 2 mg by mouth daily as needed for moderate pain.   lidocaine 5 % ointment Commonly known as:  XYLOCAINE Apply 1 application topically 3 (three) times daily as needed.   LORazepam 0.5 MG tablet Commonly known as:  ATIVAN One up to BID What changed:    how much to take  how to take this  when to take this  additional instructions   losartan 50 MG tablet Commonly known as:  COZAAR Take 1 tablet (50 mg total) by mouth 2 (two) times daily.   LUBRICATING EYE DROPS OP Place 1 drop into both eyes 3 (three) times daily.   metoprolol succinate 25 MG 24 hr tablet Commonly known as:  TOPROL-XL Take 1 tablet (25 mg total) by mouth daily. Take in addition to the 50 mg tablet daily for a total of 75 mg daily What changed:    how much to take  additional instructions   metoprolol succinate 50 MG 24 hr tablet Commonly known as:  TOPROL-XL Take 1 tablet (50 mg total) by mouth daily. Take with or immediately following a meal. Take 1 tablet (25 mg total) by mouth daily. Take in addition to the 50 mg tablet daily for a total of 75 mg daily What changed:  You were already taking a medication with the same name, and this prescription was added. Make sure you understand how  and when to take each.   metoprolol tartrate 25 MG tablet Commonly known as:  LOPRESSOR Take 1 tablet (25 mg total) by mouth every 12 (twelve) hours as needed. For palpitations and fast heart rate   MIRALAX powder Generic drug:  polyethylene glycol powder Take 17 g daily as needed by mouth for moderate constipation.   pantoprazole 40 MG tablet Commonly known as:  PROTONIX TAKE 1 TABLET BY MOUTH ONCE A DAY. What changed:    when to take this  reasons to take this   rivaroxaban 20 MG Tabs tablet Commonly known as:  XARELTO TAKE 1 TABLET DAILY WITH SUPPER. What changed:    how much to take  how to take this  when to take this  additional instructions   zolpidem 10 MG tablet Commonly known as:  AMBIEN TAKE 1 TABLET BY MOUTH AT BEDTIME FOR SLEEP. What changed:  See the new instructions.       Major procedures and Radiology Reports - PLEASE review detailed and final reports for all details, in brief -   Dg Chest 2 View  Result Date: 10/31/2017 CLINICAL DATA:  Chest pain.  Back pain. EXAM: CHEST - 2 VIEW COMPARISON:  07/11/2016 FINDINGS: Lateral view degraded by patient arm position. Osteopenia. Upper lumbar vertebral augmentation. Midline trachea. Mild cardiomegaly. Atherosclerosis in the transverse aorta.  No pleural effusion or pneumothorax. Mildly low lung volumes. No congestive failure. Increased density in the right lower lung on the frontal radiograph is not correlated on the lateral and could represent calcified granuloma or embolization of cement from vertebral augmentation. This is unchanged. No lobar consolidation. IMPRESSION: No acute cardiopulmonary disease. Cardiomegaly without congestive failure. Aortic Atherosclerosis (ICD10-I70.0). Electronically Signed   By: Jeronimo Greaves M.D.   On: 10/31/2017 21:23    Micro Results   No results found for this or any previous visit (from the past 240 hour(s)).  Today   Subjective    Katherine Lane today has no  complaints, no further  chest pains,       patient's daughter and other family buys at bedside, questions answered, ambulating around the hallways in the hospital without dizziness, palpitations, dyspnea on exertion, shortness of breath or chest pains   Patient has been seen and examined prior to discharge   Objective   Blood pressure (!) 155/79, pulse 78, temperature 97.7 F (36.5 C), temperature source Oral, resp. rate 18, height 5\' 2"  (1.575 m), weight 63.2 kg, SpO2 98 %.   Intake/Output Summary (Last 24 hours) at 11/01/2017 1524 Last data filed at 11/01/2017 0800 Gross per 24 hour  Intake 360 ml  Output 3 ml  Net 357 ml    Exam Gen:- Awake Alert,  In no apparent distress  HEENT:- [place].AT, No sclera icterus Neck-Supple Neck,No JVD,.  Lungs-  CTAB , good air movement CV- S1, S2 normal, regular Abd-  +ve B.Sounds, Abd Soft, No tenderness,    Extremity/Skin:- No  edema,   good pulses Psych-affect is appropriate, oriented x3 Neuro-no new focal deficits, no tremors   Data Review   CBC w Diff:  Lab Results  Component Value Date   WBC 5.1 11/01/2017   HGB 11.9 (L) 11/01/2017   HGB 14.1 10/14/2017   HCT 37.2 11/01/2017   HCT 44.1 10/14/2017   PLT 269 11/01/2017   PLT 353 10/14/2017   LYMPHOPCT 40 07/01/2016   MONOPCT 9 07/01/2016   EOSPCT 1 07/01/2016   BASOPCT 1 07/01/2016    CMP:  Lab Results  Component Value Date   NA 142 11/01/2017   NA 141 10/14/2017   K 3.7 11/01/2017   CL 111 11/01/2017   CO2 25 11/01/2017   BUN 14 11/01/2017   BUN 13 10/14/2017   CREATININE 0.74 11/01/2017   CREATININE 0.90 10/18/2014   PROT 6.9 10/14/2017   ALBUMIN 4.9 (H) 10/14/2017   BILITOT 0.5 10/14/2017   ALKPHOS 84 10/14/2017   AST 14 10/14/2017   ALT 8 10/14/2017  .   Total Discharge time is about 33 minutes  Shon Hale M.D on 11/01/2017 at 3:24 PM  Pager---[phone]  Go to [email or web address] - password [email or web address] for contact info  [facility] - Office   [phone]

## 2017-11-02 ENCOUNTER — Other Ambulatory Visit: Payer: Self-pay | Admitting: Family Medicine

## 2017-11-02 ENCOUNTER — Telehealth: Payer: Self-pay | Admitting: Cardiovascular Disease

## 2017-11-02 NOTE — Telephone Encounter (Signed)
I did offer an apt w/ B. Strader on 11/19/17, but pt's daughter wanted her seen w/in the week or the beginning of next week.

## 2017-11-02 NOTE — Telephone Encounter (Signed)
3:40 on 9/27.

## 2017-11-02 NOTE — Telephone Encounter (Signed)
Pt's daughter Katherine Lane called stating that her mother is needing to be seen for post hosp f/u, she was admitted all weekend w/ heart problems and they wanted her to f/u w/in a week.   She was seen in the office last week on 10/28/17   Could we get her scheduled next week on one of Dr. Charm RingsK's hosp days?

## 2017-11-02 NOTE — Telephone Encounter (Signed)
Six mo worth 

## 2017-11-02 NOTE — Telephone Encounter (Signed)
See ED H&P dated 10/31/17, patient apparently had stopped beta blockers after taking the extra Toprol XL 25 mg and had decreased BP, BP not noted.Dr.Emokpae wrote in discharge summary to see cardiology within a week or sooner.This is the patients expectation and they declined f/u in 2 weeks which is next available. Please advise.

## 2017-11-02 NOTE — Telephone Encounter (Signed)
Daughter notified 

## 2017-11-13 ENCOUNTER — Ambulatory Visit: Payer: Medicare Other | Admitting: Cardiovascular Disease

## 2017-11-13 ENCOUNTER — Encounter: Payer: Self-pay | Admitting: Cardiovascular Disease

## 2017-11-13 VITALS — BP 152/70 | HR 55 | Ht 62.0 in | Wt 143.0 lb

## 2017-11-13 DIAGNOSIS — I481 Persistent atrial fibrillation: Secondary | ICD-10-CM | POA: Diagnosis not present

## 2017-11-13 DIAGNOSIS — I4811 Longstanding persistent atrial fibrillation: Secondary | ICD-10-CM

## 2017-11-13 DIAGNOSIS — Z9289 Personal history of other medical treatment: Secondary | ICD-10-CM | POA: Diagnosis not present

## 2017-11-13 DIAGNOSIS — I10 Essential (primary) hypertension: Secondary | ICD-10-CM | POA: Diagnosis not present

## 2017-11-13 DIAGNOSIS — I5032 Chronic diastolic (congestive) heart failure: Secondary | ICD-10-CM | POA: Diagnosis not present

## 2017-11-13 NOTE — Patient Instructions (Addendum)
Your physician wants you to follow-up in: 2 months with B.Strader PA-C     Your physician recommends that you continue on your current medications as directed. Please refer to the Current Medication list given to you today.    If you need a refill on your cardiac medications before your next appointment, please call your pharmacy.   Check BP 3-4 times a week  No labs or tests ordered today      Thank you for choosing [facility] !

## 2017-11-13 NOTE — Progress Notes (Signed)
SUBJECTIVE: The patient presents for posthospitalization follow-up.  She was discharged on 11/01/2017 for atypical chest pain.  I had recently increased her Toprol-XL to 25 mg every evening and this led to some symptomatic hypotension.  She did not take her metoprolol the next day and presented to the ED with chest heaviness rating to her back with some associated shortness of breath.  Blood pressure was elevated to 158/80 and 159/107 and heart rate was 100-110 bpm.   She ruled out for an acute coronary syndrome with serial troponins. She was discharged on Toprol-XL 75 mg.  Hydralazine was increased to 50 mg two times daily.  CBC, magnesium, and basic metabolic panel were unremarkable.  BNP was mildly elevated at 353.  Chest x-ray showed no CHF.  Echocardiogram 11/01/2017 showed vigorous left ventricular systolic function, LVEF 65 to 46%, mild LVH, high ventricular filling pressures, mild aortic regurgitation, mild ascending aortic dilatation, and mild mitral and tricuspid regurgitation.  Pulmonary pressures were mild to moderately elevated, 41 mmHg.  She is here with her daughter [name].  She has been monitoring her blood pressure since being discharged and readings range from 120-150/71-80 range.  She was instructed by the hospitalist to take an extra dose of metoprolol tartrate 25 mg should her blood pressure significantly rise in the evening or if she becomes tachycardic.  She currently denies chest pain, shortness of breath, and palpitations.  She is mildly fatigued but did not sleep well last night.   Review of Systems: As per "subjective", otherwise negative.  Allergies  Allergen Reactions  . Amlodipine Swelling    Leg swelling   . Codeine Nausea And Vomiting and Other (See Comments)    Patient states "intolerance to all pain medications"  (NO OPIOIDS) VERY VIOLENT VOMITING!!  . Other Nausea And Vomiting and Other (See Comments)    general anesthesia  . Tramadol Nausea  And Vomiting and Other (See Comments)    "NO OPIOIDS!!! VERY VIOLENT VOMITING!!" per Med History prior to 02/18/17    Current Outpatient Medications  Medication Sig Dispense Refill  . ANALPRAM-HC 1-1 % rectal cream APPLY TO AFFECTED AREAS TWICE DAILY. (Patient taking differently: Place 1 application rectally 2 (two) times daily as needed for hemorrhoids or anal itching. ) 28.4 g prn  . Carboxymethylcellul-Glycerin (LUBRICATING EYE DROPS OP) Place 1 drop into both eyes 3 (three) times daily.     . hydrALAZINE (APRESOLINE) 50 MG tablet Take 1 tablet (50 mg total) by mouth 2 (two) times daily. 60 tablet 2  . HYDROmorphone (DILAUDID) 2 MG tablet Take 2 mg by mouth daily as needed for moderate pain.     Marland Kitchen lidocaine (XYLOCAINE) 5 % ointment Apply 1 application topically 3 (three) times daily as needed. 35.44 g 1  . LORazepam (ATIVAN) 0.5 MG tablet One up to BID (Patient taking differently: Take 0.5 mg by mouth See admin instructions. Pt takes 1 in am  and takes 1 every evening. *Takes as needed) 60 tablet 5  . losartan (COZAAR) 50 MG tablet Take 1 tablet (50 mg total) by mouth 2 (two) times daily. 60 tablet 2  . metoprolol succinate (TOPROL XL) 25 MG 24 hr tablet Take 1 tablet (25 mg total) by mouth daily. Take in addition to the 50 mg tablet daily for a total of 75 mg daily 90 tablet 3  . metoprolol succinate (TOPROL XL) 50 MG 24 hr tablet Take 1 tablet (50 mg total) by mouth daily. Take with or immediately  following a meal. Take 1 tablet (25 mg total) by mouth daily. Take in addition to the 50 mg tablet daily for a total of 75 mg daily 30 tablet 11  . metoprolol tartrate (LOPRESSOR) 25 MG tablet Take 1 tablet (25 mg total) by mouth every 12 (twelve) hours as needed. For palpitations and fast heart rate 60 tablet 11  . pantoprazole (PROTONIX) 40 MG tablet TAKE 1 TABLET BY MOUTH ONCE A DAY. (Patient taking differently: Take 40 mg by mouth daily as needed (for acid reflux). ) 30 tablet 0  . polyethylene  glycol powder (MIRALAX) powder Take 17 g daily as needed by mouth for moderate constipation.     . rivaroxaban (XARELTO) 20 MG TABS tablet TAKE 1 TABLET DAILY WITH SUPPER. (Patient taking differently: Take 20 mg by mouth every evening. Marland Kitchen) 90 tablet 2  . zolpidem (AMBIEN) 10 MG tablet TAKE 1 TABLET BY MOUTH AT BEDTIME FOR SLEEP. 30 tablet 0   No current facility-administered medications for this visit.     Past Medical History:  Diagnosis Date  . A-fib (HCC)   . Anxiety   . Arthritis   . CHF (congestive heart failure) (HCC)   . Chronic back pain   . Constipation   . Constipation, chronic   . Depression   . Family history of adverse reaction to anesthesia    Mother, Sister, daughter- N/V  . History of blood transfusion    "pregnancy"  . HTN (hypertension)   . Insomnia   . Migraine headache   . Osteoporosis   . PONV (postoperative nausea and vomiting)    "violent"  . Pulmonary edema 06/2016  . Sodium (Na) deficiency   . Trigeminal neuralgia     Past Surgical History:  Procedure Laterality Date  . ANKLE FRACTURE SURGERY Right   . CARDIAC CATHETERIZATION     denies  . CATARACT EXTRACTION W/PHACO Right 07/22/2012   Procedure: CATARACT EXTRACTION PHACO AND INTRAOCULAR LENS PLACEMENT (IOC);  Surgeon: Gemma Payor, MD;  Location: [facility];  Service: Ophthalmology;  Laterality: Right;  CDE:  18.93  . CATARACT EXTRACTION W/PHACO Left 08/09/2012   Procedure: CATARACT EXTRACTION PHACO AND INTRAOCULAR LENS PLACEMENT (IOC);  Surgeon: Gemma Payor, MD;  Location: [facility];  Service: Ophthalmology;  Laterality: Left;  CDE: 17.21  . COLONOSCOPY    . HARDWARE REMOVAL  07/18/2011   Procedure: HARDWARE REMOVAL; Right leg-  Surgeon: Vickki Hearing, MD;  Location: [facility];  Service: Orthopedics;  Laterality: Right;  . KYPHOPLASTY N/A 12/26/2016   Procedure: Lumbar two Lumbar three and Lumbar five KYPHOPLASTY;  Surgeon: Maeola Harman, MD;  Location: [facility] OR;  Service: Neurosurgery;  Laterality: N/A;  .  KYPHOPLASTY N/A 02/19/2017   Procedure: KYPHOPLASTY LUMBAR ONE;  Surgeon: Maeola Harman, MD;  Location: [facility];  Service: Neurosurgery;  Laterality: N/A;  KYPHOPLASTY LUMBAR ONE  . NERVE REPAIR  07/18/2011   Procedure: NERVE REPAIR;  Surgeon: Vickki Hearing, MD;  Location: [facility];  Service: Orthopedics;  Laterality: Right;  Right leg superficial peroneal nerve release    . PARTIAL HYSTERECTOMY      Social History   Socioeconomic History  . Marital status: Widowed    Spouse name: Not on file  . Number of children: Not on file  . Years of education: Not on file  . Highest education level: Not on file  Occupational History  . Occupation: retired    Associate Professor: RETIRED  Social Needs  . Financial resource strain: Not on file  .  Food insecurity:    Worry: Not on file    Inability: Not on file  . Transportation needs:    Medical: Not on file    Non-medical: Not on file  Tobacco Use  . Smoking status: Never Smoker  . Smokeless tobacco: Never Used  Substance and Sexual Activity  . Alcohol use: No    Alcohol/week: 0.0 standard drinks  . Drug use: No  . Sexual activity: Not Currently    Birth control/protection: Surgical    Comment: hyst  Lifestyle  . Physical activity:    Days per week: Not on file    Minutes per session: Not on file  . Stress: Not on file  Relationships  . Social connections:    Talks on phone: Not on file    Gets together: Not on file    Attends religious service: Not on file    Active member of club or organization: Not on file    Attends meetings of clubs or organizations: Not on file    Relationship status: Not on file  . Intimate partner violence:    Fear of current or ex partner: Not on file    Emotionally abused: Not on file    Physically abused: Not on file    Forced sexual activity: Not on file  Other Topics Concern  . Not on file  Social History Narrative  . Not on file     Vitals:   11/13/17 1543  BP: (!) 152/70  Pulse: (!) 55  SpO2:  97%  Weight: 143 lb (64.9 kg)  Height: 5\' 2"  (1.575 m)    Wt Readings from Last 3 Encounters:  11/13/17 143 lb (64.9 kg)  11/01/17 139 lb 5.3 oz (63.2 kg)  10/28/17 140 lb (63.5 kg)     PHYSICAL EXAM General: NAD HEENT: Normal. Neck: No JVD, no thyromegaly. Lungs: Clear to auscultation bilaterally with normal respiratory effort. CV: Regular rate and rhythm, normal S1/S2, no S3/S4, no murmur. No pretibial or periankle edema.    Abdomen: Soft, nontender, no distention.  Neurologic: Alert and oriented.  Psych: Normal affect. Skin: Normal. Musculoskeletal: No gross deformities.    ECG: Reviewed above under Subjective   Labs: Lab Results  Component Value Date/Time   K 3.7 11/01/2017 06:05 AM   BUN 14 11/01/2017 06:05 AM   BUN 13 10/14/2017 12:50 PM   CREATININE 0.74 11/01/2017 06:05 AM   CREATININE 0.90 10/18/2014 10:38 AM   ALT 8 10/14/2017 12:50 PM   TSH 2.490 10/14/2017 12:50 PM   HGB 11.9 (L) 11/01/2017 06:05 AM   HGB 14.1 10/14/2017 12:50 PM     Lipids: Lab Results  Component Value Date/Time   LDLCALC 93 09/24/2015 08:37 AM   CHOL 177 09/24/2015 08:37 AM   TRIG 133 09/24/2015 08:37 AM   HDL 57 09/24/2015 08:37 AM       ASSESSMENT AND PLAN:  1. Long-standing persistent atrial fibrillation: Symptomatically stable.  She did not tolerate the extra dose of Toprol-XL 25 mg in the evening due to hypotension.  Internal medicine discharged her with metoprolol tartrate 25 mg every afternoon as needed for elevated blood pressure and/or tachycardia.   Left atrial size was normal by most recent echocardiogram this month.  She is systemically anticoagulated with Xarelto. No changes to therapy.  2. Accelerated hypertension: Blood pressure is elevated.   She is currently taking hydralazine 50 mg BID, losartan 50 mg twice daily, and Toprol-XL 75 mg daily. She was intolerant to amlodipine in  the past.  I reviewed her blood pressure log as detailed above.  I have asked  her to continue to check her blood pressures 3-4 times per week for a month and to inform me of these values.  3. Chronic diastolic heart failure: Weight is up 4 lbs since last visit but she does not appear to be in decompensated heart failure. Diuretic therapy has led to hyponatremia in the past. Continue conservative measures including sodium and fluid restriction. She takes Lasix as needed.   Disposition: Follow up 2 months  Time spent: 40 minutes, of which greater than 50% was spent reviewing symptoms, relevant blood tests and studies, and discussing management plan with the patient.   Prentice Docker, M.D., F.A.C.C.

## 2017-12-02 ENCOUNTER — Encounter: Payer: Self-pay | Admitting: Family Medicine

## 2017-12-02 ENCOUNTER — Ambulatory Visit: Payer: Medicare Other | Admitting: Family Medicine

## 2017-12-02 VITALS — BP 140/82 | Ht 62.0 in | Wt 140.8 lb

## 2017-12-02 DIAGNOSIS — M545 Low back pain, unspecified: Secondary | ICD-10-CM

## 2017-12-02 DIAGNOSIS — R21 Rash and other nonspecific skin eruption: Secondary | ICD-10-CM

## 2017-12-02 DIAGNOSIS — F5101 Primary insomnia: Secondary | ICD-10-CM | POA: Diagnosis not present

## 2017-12-02 DIAGNOSIS — Z23 Encounter for immunization: Secondary | ICD-10-CM | POA: Diagnosis not present

## 2017-12-02 DIAGNOSIS — I10 Essential (primary) hypertension: Secondary | ICD-10-CM

## 2017-12-02 MED ORDER — CLOBETASOL PROPIONATE 0.05 % EX SOLN: CUTANEOUS | 3 refills | Status: DC

## 2017-12-02 MED ORDER — LORAZEPAM 0.5 MG PO TABS: ORAL_TABLET | ORAL | 5 refills | Status: DC

## 2017-12-02 MED ORDER — ZOLPIDEM TARTRATE 10 MG PO TABS: ORAL_TABLET | ORAL | 5 refills | Status: DC

## 2017-12-02 NOTE — Progress Notes (Signed)
   Subjective:    Patient ID: Katherine Lane, female    DOB: April 14, 1932, 84 y.o.   MRN: [id number]  Hypertension  This is a chronic problem. (PT was in hospital in September for chest pain) There are no compliance problems.    Blood pressure medicine and blood pressure levels reviewed today with patient. Compliant with blood pressure medicine. States does not miss a dose. No obvious side effects. Blood pressure generally good when checked elsewhere. Watching salt intake.   Patient compliant with insomnia medication. Generally takes most nights. No obvious morning drowsiness. Definitely helps patient sleep. Without it patient states would not get a good nights rest.  Anxiety meds take s daily   Ongoing chronic back pain , walks some but not a lot    nees the ambien at night inorder to get slee[m     BP meds overall doing better     PT would like refills on Ativan 0.5 mg and Ambien10 mg. . PT  would like refill on psoriasis liquid that she uses.  Has been flaring up lately.  Primarily on posterior scalp   Review of Systems No headache, no major weight loss or weight gain, no chest pain no back pain abdominal pain no change in bowel habits complete ROS otherwise negative     Objective:   Physical Exam  Alert and oriented, vitals reviewed and stable, NAD ENT-TM's and ext canals WNL bilat via otoscopic exam Soft palate, tonsils and post pharynx WNL via oropharyngeal exam Neck-symmetric, no masses; thyroid nonpalpable and nontender Pulmonary-no tachypnea or accessory muscle use; Clear without wheezes via auscultation Card--no abnrml murmurs, rhythm reg and rate WNL Carotid pulses symmetric, without bruits Posterior scalp irritation      Assessment & Plan:  Impression 1 anxiety.  Ongoing patient requests maintain same dose.  States definitely needs  2.  Insomnia.  Ongoing need for Ambien Ambien prescribed  3.  Psoriasis of scalp medications refilled per patient  request  4.  Hypertension.  Discussed with patient.  Currently followed by cardiologist.  Patient did fill [place] in on details  Flu shot  Greater than 50% of this 25 minute face to face visit was spent in counseling and discussion and coordination of care regarding the above diagnosis/diagnosies

## 2017-12-15 ENCOUNTER — Encounter (HOSPITAL_COMMUNITY): Payer: Self-pay | Admitting: Emergency Medicine

## 2017-12-15 ENCOUNTER — Observation Stay (HOSPITAL_COMMUNITY)
Admission: EM | Admit: 2017-12-15 | Discharge: 2017-12-16 | Disposition: A | Discharge status: Home / Self Care | Payer: Medicare Other | Attending: Internal Medicine | Admitting: Internal Medicine

## 2017-12-15 ENCOUNTER — Other Ambulatory Visit: Payer: Self-pay

## 2017-12-15 DIAGNOSIS — G44011 Episodic cluster headache, intractable: Secondary | ICD-10-CM | POA: Insufficient documentation

## 2017-12-15 DIAGNOSIS — F329 Major depressive disorder, single episode, unspecified: Secondary | ICD-10-CM | POA: Insufficient documentation

## 2017-12-15 DIAGNOSIS — R519 Headache, unspecified: Secondary | ICD-10-CM

## 2017-12-15 DIAGNOSIS — I480 Paroxysmal atrial fibrillation: Secondary | ICD-10-CM | POA: Diagnosis not present

## 2017-12-15 DIAGNOSIS — I160 Hypertensive urgency: Principal | ICD-10-CM | POA: Insufficient documentation

## 2017-12-15 DIAGNOSIS — H538 Other visual disturbances: Secondary | ICD-10-CM | POA: Diagnosis present

## 2017-12-15 DIAGNOSIS — R531 Weakness: Secondary | ICD-10-CM | POA: Diagnosis not present

## 2017-12-15 DIAGNOSIS — R51 Headache: Secondary | ICD-10-CM

## 2017-12-15 DIAGNOSIS — I110 Hypertensive heart disease with heart failure: Secondary | ICD-10-CM | POA: Insufficient documentation

## 2017-12-15 DIAGNOSIS — Z79899 Other long term (current) drug therapy: Secondary | ICD-10-CM | POA: Insufficient documentation

## 2017-12-15 DIAGNOSIS — F419 Anxiety disorder, unspecified: Secondary | ICD-10-CM | POA: Insufficient documentation

## 2017-12-15 DIAGNOSIS — I5032 Chronic diastolic (congestive) heart failure: Secondary | ICD-10-CM | POA: Insufficient documentation

## 2017-12-15 DIAGNOSIS — F418 Other specified anxiety disorders: Secondary | ICD-10-CM

## 2017-12-15 DIAGNOSIS — I10 Essential (primary) hypertension: Secondary | ICD-10-CM

## 2017-12-15 DIAGNOSIS — H539 Unspecified visual disturbance: Secondary | ICD-10-CM

## 2017-12-15 NOTE — ED Triage Notes (Signed)
Per family pt having elevated bp and vision changes starting around 4pm today. Pt nauseated but no vomiting.

## 2017-12-16 ENCOUNTER — Encounter (HOSPITAL_COMMUNITY): Payer: Self-pay | Admitting: Internal Medicine

## 2017-12-16 ENCOUNTER — Emergency Department (HOSPITAL_COMMUNITY): Payer: Medicare Other

## 2017-12-16 ENCOUNTER — Other Ambulatory Visit: Payer: Self-pay

## 2017-12-16 ENCOUNTER — Observation Stay (HOSPITAL_COMMUNITY): Payer: Medicare Other

## 2017-12-16 DIAGNOSIS — R519 Headache, unspecified: Secondary | ICD-10-CM

## 2017-12-16 DIAGNOSIS — H539 Unspecified visual disturbance: Secondary | ICD-10-CM | POA: Diagnosis not present

## 2017-12-16 DIAGNOSIS — I5032 Chronic diastolic (congestive) heart failure: Secondary | ICD-10-CM

## 2017-12-16 DIAGNOSIS — I160 Hypertensive urgency: Secondary | ICD-10-CM | POA: Diagnosis present

## 2017-12-16 DIAGNOSIS — R51 Headache: Secondary | ICD-10-CM

## 2017-12-16 DIAGNOSIS — I480 Paroxysmal atrial fibrillation: Secondary | ICD-10-CM

## 2017-12-16 DIAGNOSIS — F418 Other specified anxiety disorders: Secondary | ICD-10-CM

## 2017-12-16 LAB — CBC WITH DIFFERENTIAL/PLATELET
Abs Immature Granulocytes: 0.02 10*3/uL (ref 0.00–0.07)
Basophils Absolute: 0 10*3/uL (ref 0.0–0.1)
Basophils Relative: 0 %
Eosinophils Absolute: 0 10*3/uL (ref 0.0–0.5)
Eosinophils Relative: 0 %
HCT: 42.6 % (ref 36.0–46.0)
Hemoglobin: 13.4 g/dL (ref 12.0–15.0)
Immature Granulocytes: 0 %
Lymphocytes Relative: 40 %
Lymphs Abs: 2.5 10*3/uL (ref 0.7–4.0)
MCH: 28.8 pg (ref 26.0–34.0)
MCHC: 31.5 g/dL (ref 30.0–36.0)
MCV: 91.4 fL (ref 80.0–100.0)
Monocytes Absolute: 0.5 10*3/uL (ref 0.1–1.0)
Monocytes Relative: 7 %
Neutro Abs: 3.2 10*3/uL (ref 1.7–7.7)
Neutrophils Relative %: 53 %
Platelets: 300 10*3/uL (ref 150–400)
RBC: 4.66 MIL/uL (ref 3.87–5.11)
RDW: 13.8 % (ref 11.5–15.5)
WBC: 6.2 10*3/uL (ref 4.0–10.5)
nRBC: 0 % (ref 0.0–0.2)

## 2017-12-16 LAB — TROPONIN I
Troponin I: <0.03 ng/mL (ref <0.03)
Troponin I: <0.03 ng/mL (ref <0.03)
Troponin I: <0.03 ng/mL (ref <0.03)

## 2017-12-16 LAB — COMPREHENSIVE METABOLIC PANEL
ALT: 12 U/L (ref 0–44)
AST: 19 U/L (ref 15–41)
Albumin: 5 g/dL (ref 3.5–5.0)
Alkaline Phosphatase: 78 U/L (ref 38–126)
Anion gap: 11 (ref 5–15)
BUN: 10 mg/dL (ref 8–23)
CO2: 23 mmol/L (ref 22–32)
Calcium: 9.5 mg/dL (ref 8.9–10.3)
Chloride: 105 mmol/L (ref 98–111)
Creatinine, Ser: 0.85 mg/dL (ref 0.44–1.00)
GFR calc Af Amer: >60 mL/min (ref >60)
GFR calc non Af Amer: >60 mL/min (ref >60)
Glucose, Bld: 121 mg/dL — ABNORMAL HIGH (ref 70–99)
Potassium: 3.5 mmol/L (ref 3.5–5.1)
Sodium: 139 mmol/L (ref 135–145)
Total Bilirubin: 0.7 mg/dL (ref 0.3–1.2)
Total Protein: 8 g/dL (ref 6.5–8.1)

## 2017-12-16 LAB — BRAIN NATRIURETIC PEPTIDE: B Natriuretic Peptide: 337 pg/mL — ABNORMAL HIGH (ref 0.0–100.0)

## 2017-12-16 MED ORDER — LORAZEPAM 0.5 MG PO TABS: 0.5000 mg | ORAL_TABLET | Freq: Every evening | ORAL | Status: DC

## 2017-12-16 MED ORDER — POLYETHYLENE GLYCOL 3350 17 GM/SCOOP PO POWD
17.0000 g | Freq: Every day | ORAL | Status: DC | PRN
  Filled 2017-12-16: qty 255

## 2017-12-16 MED ORDER — HYDRALAZINE HCL 50 MG PO TABS: 50.0000 mg | ORAL_TABLET | Freq: Three times a day (TID) | ORAL | 1 refills | Status: DC

## 2017-12-16 MED ORDER — SODIUM CHLORIDE 0.9% FLUSH: 3.0000 mL | INTRAVENOUS | Status: DC | PRN

## 2017-12-16 MED ORDER — SODIUM CHLORIDE 0.9% FLUSH: 3.0000 mL | Freq: Two times a day (BID) | INTRAVENOUS | Status: DC

## 2017-12-16 MED ORDER — ACETAMINOPHEN 650 MG RE SUPP: 650.0000 mg | Freq: Four times a day (QID) | RECTAL | Status: DC | PRN

## 2017-12-16 MED ORDER — LOSARTAN POTASSIUM 50 MG PO TABS: 50.0000 mg | ORAL_TABLET | Freq: Two times a day (BID) | ORAL | Status: DC

## 2017-12-16 MED ORDER — LOSARTAN POTASSIUM 50 MG PO TABS: 75.0000 mg | ORAL_TABLET | Freq: Every day | ORAL | 1 refills | Status: DC

## 2017-12-16 MED ORDER — HYDRALAZINE HCL 20 MG/ML IJ SOLN: 10.0000 mg | Freq: Four times a day (QID) | INTRAMUSCULAR | Status: DC | PRN

## 2017-12-16 MED ORDER — HYDRALAZINE HCL 20 MG/ML IJ SOLN
10.0000 mg | Freq: Once | INTRAMUSCULAR | Status: AC
  Administered 2017-12-16: 10 mg via INTRAVENOUS
  Filled 2017-12-16: qty 1

## 2017-12-16 MED ORDER — METOPROLOL SUCCINATE ER 50 MG PO TB24: 50.0000 mg | ORAL_TABLET | Freq: Every day | ORAL | 2 refills | Status: DC

## 2017-12-16 MED ORDER — LOSARTAN POTASSIUM 50 MG PO TABS: 75.0000 mg | ORAL_TABLET | Freq: Two times a day (BID) | ORAL | Status: DC

## 2017-12-16 MED ORDER — CLONIDINE HCL 0.1 MG PO TABS
0.1000 mg | ORAL_TABLET | Freq: Once | ORAL | Status: AC
  Administered 2017-12-16: 0.1 mg via ORAL
  Filled 2017-12-16: qty 1

## 2017-12-16 MED ORDER — ACETAMINOPHEN 325 MG PO TABS: 650.0000 mg | ORAL_TABLET | Freq: Four times a day (QID) | ORAL | Status: DC | PRN

## 2017-12-16 MED ORDER — METOPROLOL SUCCINATE ER 50 MG PO TB24: 50.0000 mg | ORAL_TABLET | Freq: Every day | ORAL | Status: DC

## 2017-12-16 MED ORDER — CARBOXYMETHYLCELLUL-GLYCERIN 0.5-0.9 % OP SOLN: 1.0000 [drp] | Freq: Three times a day (TID) | OPHTHALMIC | Status: DC

## 2017-12-16 MED ORDER — RIVAROXABAN 20 MG PO TABS: 20.0000 mg | ORAL_TABLET | Freq: Every evening | ORAL | Status: DC

## 2017-12-16 MED ORDER — HYDRALAZINE HCL 25 MG PO TABS
50.0000 mg | ORAL_TABLET | Freq: Three times a day (TID) | ORAL | Status: DC
  Administered 2017-12-16: 50 mg via ORAL

## 2017-12-16 MED ORDER — ZOLPIDEM TARTRATE 5 MG PO TABS: 5.0000 mg | ORAL_TABLET | Freq: Every evening | ORAL | Status: DC | PRN

## 2017-12-16 MED ORDER — SODIUM CHLORIDE 0.9 % IV SOLN: 250.0000 mL | INTRAVENOUS | Status: DC | PRN

## 2017-12-16 MED ORDER — PANTOPRAZOLE SODIUM 40 MG PO TBEC: 40.0000 mg | DELAYED_RELEASE_TABLET | Freq: Every day | ORAL | Status: DC | PRN

## 2017-12-16 MED ORDER — HYDRALAZINE HCL 25 MG PO TABS
50.0000 mg | ORAL_TABLET | Freq: Three times a day (TID) | ORAL | Status: DC
  Filled 2017-12-16: qty 2

## 2017-12-16 NOTE — ED Provider Notes (Signed)
[facility] EMERGENCY DEPARTMENT Provider Note   CSN: [id number] Arrival date & time: 12/15/17  2345  Time seen 23:51 PM    History   Chief Complaint Chief Complaint  Patient presents with  . Hypertension    HPI Katherine Lane is a 84 y.o. female.  HPI patient states today was her birthday and her sister made hot dogs.  She ate 1 hot dog around noon.  She states however she did not feel good yesterday and cannot really tell me how she felt.  She states her blood pressure was up yesterday evening when she went to bed approximately 189/106 and she took an extra 1 of her blood pressure pills, she states "the new one" that starts with a "M".  However when I go through all of her blood pressure medicine she states those are not the ones she took or that were new.  She states about 4 PM she checked her blood pressure which she normally does due to her having a history of hypertension and it started getting over 200.  She states that she has blurred vision but cannot describe what that means, she has a pressure on the side of her head, she denies chest pain, headache, she does have some nausea without vomiting, and she denies shortness of breath.  She denies any swelling of her legs or palpitations today although she has a history of atrial fibrillation.  She states she took an extra 1 of her blood pressure pills at 6 PM, a half a pill at 10 PM and another half a pill at 11 PM.  Again she does not know which one this is.  Patient was just discharged from the hospital on September 14 when she was admitted for atypical chest pain.  At that time it looks like her blood pressure medications were adjusted, I cannot tell that there were any new medication started.  She also followed up with Dr. Purvis Sheffield on September 27.  At that time also I could not tell that she was started on a new blood pressure pill but it appears her doses were changed how she was in the hospital.  Patient states in the morning her  blood pressure is usually around 120/80.  She states this morning it was 169 over 80s.  PCP Merlyn Albert, MD Cardiology Dr Darl Householder  Past Medical History:  Diagnosis Date  . A-fib (HCC)   . Anxiety   . Arthritis   . CHF (congestive heart failure) (HCC)   . Chronic back pain   . Constipation   . Constipation, chronic   . Depression   . Family history of adverse reaction to anesthesia    Mother, Sister, daughter- N/V  . History of blood transfusion    "pregnancy"  . HTN (hypertension)   . Insomnia   . Migraine headache   . Osteoporosis   . PONV (postoperative nausea and vomiting)    "violent"  . Pulmonary edema 06/2016  . Sodium (Na) deficiency   . Trigeminal neuralgia     Patient Active Problem List   Diagnosis Date Noted  . Chronic diastolic CHF (congestive heart failure) (HCC) 10/31/2017  . Atypical chest pain 10/31/2017  . Chest pain 10/31/2017  . Compression fracture of L1 lumbar vertebra (HCC) 02/19/2017  . Lumbar compression fracture (HCC) 12/26/2016  . Gastroesophageal reflux disease without esophagitis 07/18/2016  . Acute on chronic diastolic CHF (congestive heart failure) (HCC) 07/11/2016  . Chronic coughing 07/11/2016  . Pulmonary edema  07/10/2016  . Migraine headache 07/01/2016  . Blurred vision, bilateral 07/01/2016  . Anxiety 06/30/2016  . Altered mental status 12/06/2015  . Hyponatremia 12/06/2015  . Hypokalemia 12/06/2015  . Dehydration 12/06/2015  . Prolonged Q-T interval on ECG 12/06/2015  . Postural dizziness with presyncope 12/06/2015  . Atrial fibrillation (HCC) 07/14/2013  . Atrial flutter (HCC) 05/12/2013  . Bulging discs 04/20/2013  . Trigeminal neuralgia 01/15/2013  . Cellulitis 07/31/2011  . Mechanical complication of internal orthopedic implant (HCC) 07/21/2011  . 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 Ankle fracture 07/02/2010  . LESION OF PLANTAR NERVE 05/14/2009  . CLOSED BIMALLEOLAR FRACTURE 03/01/2009  .  Essential hypertension 11/07/2008  . CONSTIPATION, CHRONIC 11/07/2008  . HEMATOCHEZIA 11/07/2008  . Osteoporosis 11/07/2008    Past Surgical History:  Procedure Laterality Date  . ANKLE FRACTURE SURGERY Right   . CARDIAC CATHETERIZATION     denies  . CATARACT EXTRACTION W/PHACO Right 07/22/2012   Procedure: CATARACT EXTRACTION PHACO AND INTRAOCULAR LENS PLACEMENT (IOC);  Surgeon: Gemma Payor, MD;  Location: [facility];  Service: Ophthalmology;  Laterality: Right;  CDE:  18.93  . CATARACT EXTRACTION W/PHACO Left 08/09/2012   Procedure: CATARACT EXTRACTION PHACO AND INTRAOCULAR LENS PLACEMENT (IOC);  Surgeon: Gemma Payor, MD;  Location: [facility];  Service: Ophthalmology;  Laterality: Left;  CDE: 17.21  . COLONOSCOPY    . HARDWARE REMOVAL  07/18/2011   Procedure: HARDWARE REMOVAL; Right leg-  Surgeon: Vickki Hearing, MD;  Location: [facility];  Service: Orthopedics;  Laterality: Right;  . KYPHOPLASTY N/A 12/26/2016   Procedure: Lumbar two Lumbar three and Lumbar five KYPHOPLASTY;  Surgeon: Maeola Harman, MD;  [facility];  Service: Neurosurgery;  Laterality: N/A;  . KYPHOPLASTY N/A 02/19/2017   Procedure: KYPHOPLASTY LUMBAR ONE;  Surgeon: Maeola Harman, MD;  Location: [facility] OR;  Service: Neurosurgery;  Laterality: N/A;  KYPHOPLASTY LUMBAR ONE  . NERVE REPAIR  07/18/2011   Procedure: NERVE REPAIR;  Surgeon: Vickki Hearing, MD;  Location: [facility];  Service: Orthopedics;  Laterality: Right;  Right leg superficial peroneal nerve release    . PARTIAL HYSTERECTOMY       OB History    Gravida  3   Para  3   Term  3   Preterm      AB      Living  3     SAB      TAB      Ectopic      Multiple      Live Births  3            Home Medications    Prior to Admission medications   Medication Sig Start Date End Date Taking? Authorizing Provider  ANALPRAM-HC 1-1 % rectal cream APPLY TO AFFECTED AREAS TWICE DAILY. Patient taking differently: Place 1 application rectally 2 (two) times  daily as needed for hemorrhoids or anal itching.  07/01/16   Adline Potter, NP  Carboxymethylcellul-Glycerin (LUBRICATING EYE DROPS OP) Place 1 drop into both eyes 3 (three) times daily.     [provider]  clobetasol (TEMOVATE) 0.05 % external solution APPLY TO AFFECTED AREAS TWICE DAILY. 12/02/17   Merlyn Albert, MD  hydrALAZINE (APRESOLINE) 50 MG tablet Take 1 tablet (50 mg total) by mouth 2 (two) times daily. 11/01/17   Shon Hale, MD  HYDROmorphone (DILAUDID) 2 MG tablet Take 2 mg by mouth daily as needed for moderate pain.  08/19/17   [provider]  lidocaine (XYLOCAINE)  5 % ointment Apply 1 application topically 3 (three) times daily as needed. 10/27/17   Adline Potter, NP  LORazepam (ATIVAN) 0.5 MG tablet One up to BID 12/02/17   Merlyn Albert, MD  losartan (COZAAR) 50 MG tablet Take 1 tablet (50 mg total) by mouth 2 (two) times daily. 11/01/17   Shon Hale, MD  metoprolol succinate (TOPROL XL) 25 MG 24 hr tablet Take 1 tablet (25 mg total) by mouth daily. Take in addition to the 50 mg tablet daily for a total of 75 mg daily 11/01/17   Shon Hale, MD  metoprolol succinate (TOPROL XL) 50 MG 24 hr tablet Take 1 tablet (50 mg total) by mouth daily. Take with or immediately following a meal. Take 1 tablet (25 mg total) by mouth daily. Take in addition to the 50 mg tablet daily for a total of 75 mg daily 11/01/17 11/01/18  Shon Hale, MD  metoprolol tartrate (LOPRESSOR) 25 MG tablet Take 1 tablet (25 mg total) by mouth every 12 (twelve) hours as needed. For palpitations and fast heart rate 11/01/17 11/01/18  Emokpae, Courage, MD  pantoprazole (PROTONIX) 40 MG tablet TAKE 1 TABLET BY MOUTH ONCE A DAY. Patient taking differently: Take 40 mg by mouth daily as needed (for acid reflux).  09/25/17   Laqueta Linden, MD  polyethylene glycol powder (MIRALAX) powder Take 17 g daily as needed by mouth for moderate constipation.     [provider]  rivaroxaban (XARELTO) 20 MG TABS tablet TAKE 1 TABLET DAILY WITH SUPPER. Patient taking differently: Take 20 mg by mouth every evening. . 06/25/17   Laqueta Linden, MD  zolpidem (AMBIEN) 10 MG tablet TAKE 1 TABLET BY MOUTH AT BEDTIME FOR SLEEP. 12/02/17   Merlyn Albert, MD    Family History Family History  Problem Relation Age of Onset  . Hypertension Mother   . Alzheimer's disease Mother   . Cancer Mother   . Hypertension Father   . Other Father        abused pain meds  . Diabetes Brother   . Other Brother        back problems  . Diabetes Daughter   . Anesthesia problems Neg Hx   . Hypotension Neg Hx   . Malignant hyperthermia Neg Hx   . Pseudochol deficiency Neg Hx     Social History Social History   Tobacco Use  . Smoking status: Never Smoker  . Smokeless tobacco: Never Used  Substance Use Topics  . Alcohol use: No    Alcohol/week: 0.0 standard drinks  . Drug use: No  lives at home Lives alone  Allergies   Amlodipine; Codeine; Other; and Tramadol   Review of Systems Review of Systems  All other systems reviewed and are negative.    Physical Exam Updated Vital Signs BP (!) 196/75   Pulse 77   Temp 98.4 F (36.9 C) (Oral)   Resp (!) 21   Ht 5\' 2"  (1.575 m)   Wt 64 kg   SpO2 95%   BMI 25.81 kg/m   Vital signs normal except for hypertension   Physical Exam  Constitutional: She is oriented to person, place, and time. She appears well-developed and well-nourished.  Non-toxic appearance. She does not appear ill. No distress.  HENT:  Head: Normocephalic and atraumatic.  Right Ear: External ear normal.  Left Ear: External ear normal.  Nose: Nose normal. No mucosal edema or rhinorrhea.  Mouth/Throat: Oropharynx is clear and moist  and mucous membranes are normal. No dental abscesses or uvula swelling.  Eyes: Pupils are equal, round, and reactive to light. Conjunctivae and EOM are normal.  Neck: Normal range of motion and full passive range  of motion without pain. Neck supple.  Cardiovascular: Normal rate, regular rhythm and normal heart sounds. Exam reveals no gallop and no friction rub.  No murmur heard. Pulmonary/Chest: Effort normal and breath sounds normal. No respiratory distress. She has no wheezes. She has no rhonchi. She has no rales. She exhibits no tenderness and no crepitus.  Abdominal: Soft. Normal appearance and bowel sounds are normal. She exhibits no distension. There is no tenderness. There is no rebound and no guarding.  Musculoskeletal: Normal range of motion. She exhibits no edema or tenderness.  Moves all extremities well.   Neurological: She is alert and oriented to person, place, and time. She has normal strength. No cranial nerve deficit.  Skin: Skin is warm, dry and intact. No rash noted. No erythema. No pallor.  Psychiatric: Her speech is normal and behavior is normal. Her mood appears anxious.  Nursing note and vitals reviewed.    ED Treatments / Results  Labs (all labs ordered are listed, but only abnormal results are displayed) Results for orders placed or performed during the hospital encounter of 12/15/17  Comprehensive metabolic panel  Result Value Ref Range   Sodium 139 135 - 145 mmol/L   Potassium 3.5 3.5 - 5.1 mmol/L   Chloride 105 98 - 111 mmol/L   CO2 23 22 - 32 mmol/L   Glucose, Bld 121 (H) 70 - 99 mg/dL   BUN 10 8 - 23 mg/dL   Creatinine, Ser 1.61 0.44 - 1.00 mg/dL   Calcium 9.5 8.9 - 09.6 mg/dL   Total Protein 8.0 6.5 - 8.1 g/dL   Albumin 5.0 3.5 - 5.0 g/dL   AST 19 15 - 41 U/L   ALT 12 0 - 44 U/L   Alkaline Phosphatase 78 38 - 126 U/L   Total Bilirubin 0.7 0.3 - 1.2 mg/dL   GFR calc non Af Amer >60 >60 mL/min   GFR calc Af Amer >60 >60 mL/min   Anion gap 11 5 - 15  CBC with Differential  Result Value Ref Range   WBC 6.2 4.0 - 10.5 K/uL   RBC 4.66 3.87 - 5.11 MIL/uL   Hemoglobin 13.4 12.0 - 15.0 g/dL   HCT 04.5 40.9 - 81.1 %   MCV 91.4 80.0 - 100.0 fL   MCH 28.8 26.0 -  34.0 pg   MCHC 31.5 30.0 - 36.0 g/dL   RDW 91.4 78.2 - 95.6 %   Platelets 300 150 - 400 K/uL   nRBC 0.0 0.0 - 0.2 %   Neutrophils Relative % 53 %   Neutro Abs 3.2 1.7 - 7.7 K/uL   Lymphocytes Relative 40 %   Lymphs Abs 2.5 0.7 - 4.0 K/uL   Monocytes Relative 7 %   Monocytes Absolute 0.5 0.1 - 1.0 K/uL   Eosinophils Relative 0 %   Eosinophils Absolute 0.0 0.0 - 0.5 K/uL   Basophils Relative 0 %   Basophils Absolute 0.0 0.0 - 0.1 K/uL   Immature Granulocytes 0 %   Abs Immature Granulocytes 0.02 0.00 - 0.07 K/uL  Troponin I  Result Value Ref Range   Troponin I <0.03 <0.03 ng/mL  Brain natriuretic peptide  Result Value Ref Range   B Natriuretic Peptide 337.0 (H) 0.0 - 100.0 pg/mL   Laboratory interpretation all  normal except mild elevation of BNP    EKG EKG Interpretation  Date/Time:  Wednesday December 16 2017 00:00:47 EDT Ventricular Rate:  81 PR Interval:    QRS Duration: 109 QT Interval:  423 QTC Calculation: 491 R Axis:   54 Text Interpretation:  Sinus rhythm Ventricular premature complex Consider left atrial enlargement Anterior infarct, old Baseline wander in lead(s) II III aVF Since last tracing 31 Oct 2017 Normal sinus rhythm has replaced Atrial fibrillation Confirmed by Devoria Albe ([id number]) on 12/16/2017 1:07:34 AM   Radiology Ct Head Wo Contrast  Result Date: 12/16/2017 CLINICAL DATA:  84 year old female with hypertension and headache. EXAM: CT HEAD WITHOUT CONTRAST TECHNIQUE: Contiguous axial images were obtained from the base of the skull through the vertex without intravenous contrast. COMPARISON:  Head CT dated 06/30/2016 FINDINGS: Brain: There is mild age-related atrophy and moderate chronic microvascular ischemic changes. There is no acute intracranial hemorrhage. No mass effect or midline shift. No extra-axial fluid collection. Vascular: No hyperdense vessel or unexpected calcification. Skull: Normal. Negative for fracture or focal lesion. Sinuses/Orbits: No  acute finding. Other: None IMPRESSION: 1. No acute intracranial hemorrhage. 2. Age-related atrophy and chronic microvascular ischemic changes. Electronically Signed   By: Elgie Collard M.D.   On: 12/16/2017 04:28    Procedures .Critical Care Performed by: Devoria Albe, MD Authorized by: Devoria Albe, MD   Critical care provider statement:    Critical care time (minutes):  35   Critical care was necessary to treat or prevent imminent or life-threatening deterioration of the following conditions:  CNS failure or compromise   Critical care was time spent personally by me on the following activities:  Discussions with consultants, examination of patient, obtaining history from patient or surrogate, ordering and review of laboratory studies, ordering and review of radiographic studies and re-evaluation of patient's condition   (including critical care time)  Medications Ordered in ED Medications  hydrALAZINE (APRESOLINE) injection 10 mg (10 mg Intravenous Given 12/16/17 0054)  hydrALAZINE (APRESOLINE) injection 10 mg (10 mg Intravenous Given 12/16/17 0126)  cloNIDine (CATAPRES) tablet 0.1 mg (0.1 mg Oral Given 12/16/17 0216)     Initial Impression / Assessment and Plan / ED Course  I have reviewed the triage vital signs and the nursing notes.  Pertinent labs & imaging results that were available during my care of the patient were reviewed by me and considered in my medical decision making (see chart for details).     Pt was given hydralazine IV her BP in triage that was 230/99.   12:25 AM After the first dose of hydralazine her blood pressure improved into the 186/64 range.  She received a second dose of Apresoline.  Please note that she did become bradycardic which is why I did not give her a beta-blocker or Cardizem.   1:50 AM her blood pressure is still elevated at 186/84.  She states she still having pressure in the sides of her head and her vision has not improved. She was given  oral clonidine 0.1 mg orally.  Recheck at 3:10 AM patient states she still has pressure in the side of her head, she still has the visual change that she cannot describe.  She states she feels her heart pulsating in her toes and her feet feel like they are in ice water.  She states her headache is better but her visual acuity changes are still there, she states she has had it before with elevation of her blood pressure.  Her blood pressure  had improved to 145/96 but currently is in the 167/59 range.  CT of the head was ordered.  Patient remains in normal sinus rhythm on the monitor with PACs.   Recheck around 4:45 AM patient still complains of the headache, her head CT does not show anything acute.  I will talk to the hospitalist about admitting.  Patient probably needs an MRI to make sure she is not having an occipital stroke.  Our MRI scanning will be available at around 630 to 7 AM this morning.  Her blood pressure is controlled now and she still has symptoms.  Her last blood pressure at 3:30 AM was 141/55.  She does not appear to have any other localizing symptoms to suggest stroke.  5:14 AM Dr. Selena Batten, hospitalist will admit.  Final Clinical Impressions(s) / ED Diagnoses   Final diagnoses:  Essential hypertension  Visual changes  Intractable episodic headache, unspecified headache type    Plan admission  Devoria Albe, MD, Concha Pyo, MD 12/16/17 [id number]

## 2017-12-16 NOTE — Plan of Care (Signed)
Adequate for discharge.

## 2017-12-16 NOTE — Discharge Summary (Signed)
Physician Discharge Summary  Katherine Lane ZOX:[id number] DOB: 1932-07-23 DOA: 12/15/2017  PCP: Merlyn Albert, MD  Admit date: 12/15/2017 Discharge date: 12/16/2017  Time spent: 35 minutes  Recommendations for Outpatient Follow-up:  1. Reassess blood pressure and further adjust antihypertensive regimen as needed 2. Repeat basic metabolic panel to follow electrolytes and renal function.   Discharge Diagnoses:  Principal Problem:   Hypertensive urgency Active Problems:   PAF (paroxysmal atrial fibrillation) (HCC)   Chronic diastolic congestive heart failure (HCC)   Intractable episodic headache   Visual changes   Depression with anxiety   Discharge Condition: Stable and improved.  Patient discharged home with instruction to follow-up with PCP in 2 weeks.  Diet recommendation: Heart healthy/low-sodium diet.  Filed Weights   12/15/17 2356  Weight: 64 kg    History of present illness:  As per H&P written by Dr. Selena Batten on 12/16/2017 84 y.o. female, w hx of hypertension,  CHF (diastolic), Pafib,  Migraines apparently c/o visual disturbance to ED, however, she is pointing to pressure over her bilateral temples.  She also states that she had "tunnel vision"  And that this started at 4pm 12/15/2017.  Pt noted that her bp was high and decided to come to ED.  Pt denies dysarthria, focal weakness, numbness, tingling, cp, palp, sob, n/v, diarrhea, brbpr.    In Ed,  Bp 230/99  T 98.4, P 61, Bp 174/74 (after hydralazine 10mg  iv x2,), pox 96%  CT brain IMPRESSION: 1. No acute intracranial hemorrhage. 2. Age-related atrophy and chronic microvascular ischemic changes.  Na 139, K 3.5, Bun 10, Creatinine 0.85  Wbc 6.2, Hgb 13.4, Plt 300  Trop <0.03   BNP 337  Per RN hr dropped to 37 ?  Hospital Course:  1-hypertensive urgency -According to patient know the first time that she experienced trouble controlling her blood pressure -Will continue the use of metoprolol 50  mg extended release once a day; hydralazine will be adjusted to 50 mg 3 times a day and she will use Cozaar 75 mg once a day -Patient has been instructed to follow a low-sodium diet and she will follow-up with her PCP in the next 2 weeks to further adjust antihypertensive regimen as needed. -At discharge she was asymptomatic and her BP was much improved.  2-paroxysmal atrial fibrillation with bradycardia -Patient reaching heart rate in the 30s to 40s especially around bedtime -Metoprolol dose has been adjusted to 50 mg daily instead of 75. -At the end of discharge heart rate in the 50s to 60s stable and well-controlled -Patient will follow-up with her cardiologist as an outpatient to further adjust rate control agents as needed -Continue Xarelto for secondary prevention -CHADsVASC score 3-4  3-GERD -continue PPI  4-depression/anxiety/insomnia  -No suicidal ideation or hallucination -Continue Ativan as needed -continue PRN zolpidem   5-hx of hemorrhoids  -continue the use of Anal Cream -no active bleeding reported.  6-chronic diastolic heart failure -Compensated -Continue beta-blocker and low-sodium diet -Outpatient follow-up with her cardiologist. -Patient encouraged to check her weight on daily basis and to follow low-sodium diet.  Procedures:  See below for x-ray reports.  Consultations:  None  Discharge Exam: Vitals:   12/16/17 0800 12/16/17 0847  BP: (!) 156/56 (!) 163/71  Pulse: (!) 40 94  Resp: 20 18  Temp:    SpO2: 95% 93%    General: Afebrile, no chest pain, no shortness of breath, no nausea or vomiting.  Patient reports significant improvement in her headache and vision disturbances.  Cardiovascular: Rate controlled, soft systolic ejection murmur, no rubs, no gallops, no JVD. Respiratory: Good air movement bilaterally, no wheezing, no crackles, good oxygen saturation on room air. Abdomen: Soft, nontender, nondistended, positive bowel sounds. Extremities: No  edema, no cyanosis or clubbing.  Discharge Instructions   Discharge Instructions    Diet - low sodium heart healthy   Complete by:  As directed    Discharge instructions   Complete by:  As directed    Keep yourself well-hydrated Take medications as prescribed Notice losartan has been change to 75 mg daily (taken nighttime) Your hydralazine 50 mg has been changed to 3 times a day. Continue metoprolol 50 mg extended release once a day (take in the morning). Follow heart healthy diet (sodium intake less than 2.5 g daily). Arrange follow-up with your PCP in 2 weeks Arrange follow-up with your cardiologist (Dr. Purvis Sheffield) in 10 days.   Increase activity slowly   Complete by:  As directed      Allergies as of 12/16/2017      Reactions   Amlodipine Swelling   Leg swelling   Codeine Nausea And Vomiting, Other (See Comments)   Patient states "intolerance to all pain medications"  (NO OPIOIDS) VERY VIOLENT VOMITING!!   Other Nausea And Vomiting, Other (See Comments)   general anesthesia   Tramadol Nausea And Vomiting, Other (See Comments)   "NO OPIOIDS!!! VERY VIOLENT VOMITING!!" per Med History prior to 02/18/17      Medication List    TAKE these medications   ANALPRAM-HC 1-1 % rectal cream Generic drug:  pramoxine-hydrocortisone APPLY TO AFFECTED AREAS TWICE DAILY. What changed:  See the new instructions.   clobetasol 0.05 % external solution Commonly known as:  TEMOVATE APPLY TO AFFECTED AREAS TWICE DAILY.   hydrALAZINE 50 MG tablet Commonly known as:  APRESOLINE Take 1 tablet (50 mg total) by mouth 3 (three) times daily. What changed:  when to take this   HYDROmorphone 2 MG tablet Commonly known as:  DILAUDID Take 2 mg by mouth daily as needed for moderate pain.   lidocaine 5 % ointment Commonly known as:  XYLOCAINE Apply 1 application topically 3 (three) times daily as needed.   LORazepam 0.5 MG tablet Commonly known as:  ATIVAN One up to BID   losartan 50 MG  tablet Commonly known as:  COZAAR Take 1.5 tablets (75 mg total) by mouth daily. What changed:    how much to take  when to take this   LUBRICATING EYE DROPS OP Place 1 drop into both eyes 3 (three) times daily.   metoprolol succinate 50 MG 24 hr tablet Commonly known as:  TOPROL-XL Take 1 tablet (50 mg total) by mouth daily. Take with or immediately following a meal. What changed:  additional instructions   MIRALAX powder Generic drug:  polyethylene glycol powder Take 17 g daily as needed by mouth for moderate constipation.   pantoprazole 40 MG tablet Commonly known as:  PROTONIX TAKE 1 TABLET BY MOUTH ONCE A DAY. What changed:    when to take this  reasons to take this   rivaroxaban 20 MG Tabs tablet Commonly known as:  XARELTO TAKE 1 TABLET DAILY WITH SUPPER. What changed:    how much to take  how to take this  when to take this  additional instructions   zolpidem 10 MG tablet Commonly known as:  AMBIEN TAKE 1 TABLET BY MOUTH AT BEDTIME FOR SLEEP.      Allergies  Allergen Reactions  .  Amlodipine Swelling    Leg swelling   . Codeine Nausea And Vomiting and Other (See Comments)    Patient states "intolerance to all pain medications"  (NO OPIOIDS) VERY VIOLENT VOMITING!!  . Other Nausea And Vomiting and Other (See Comments)    general anesthesia  . Tramadol Nausea And Vomiting and Other (See Comments)    "NO OPIOIDS!!! VERY VIOLENT VOMITING!!" per Med History prior to 02/18/17   Follow-up Information    Luking, Vilinda Blanks, MD. Schedule an appointment as soon as possible for a visit in 2 week(s).   Specialty:  Family Medicine Contact information: [address] [place] [address] [phone]        Laqueta Linden, MD Follow up.   Specialty:  Cardiology Why:  contact office for appointment details Contact information: [address] [place] [address] [phone]           The results of significant diagnostics from  this hospitalization (including imaging, microbiology, ancillary and laboratory) are listed below for reference.    Significant Diagnostic Studies: Ct Head Wo Contrast  Result Date: 12/16/2017 CLINICAL DATA:  84 year old female with hypertension and headache. EXAM: CT HEAD WITHOUT CONTRAST TECHNIQUE: Contiguous axial images were obtained from the base of the skull through the vertex without intravenous contrast. COMPARISON:  Head CT dated 06/30/2016 FINDINGS: Brain: There is mild age-related atrophy and moderate chronic microvascular ischemic changes. There is no acute intracranial hemorrhage. No mass effect or midline shift. No extra-axial fluid collection. Vascular: No hyperdense vessel or unexpected calcification. Skull: Normal. Negative for fracture or focal lesion. Sinuses/Orbits: No acute finding. Other: None IMPRESSION: 1. No acute intracranial hemorrhage. 2. Age-related atrophy and chronic microvascular ischemic changes. Electronically Signed   By: Elgie Collard M.D.   On: 12/16/2017 04:28   Mr Brain Wo Contrast  Result Date: 12/16/2017 CLINICAL DATA:  84 year old female with headache and weakness for 1 day. EXAM: MRI HEAD WITHOUT CONTRAST TECHNIQUE: Multiplanar, multiecho pulse sequences of the brain and surrounding structures were obtained without intravenous contrast. COMPARISON:  Head CT without contrast 0333 hours today. Brain MRI and intracranial MRA 12/06/2015. FINDINGS: Brain: Stable cerebral volume. No restricted diffusion to suggest acute infarction. No midline shift, mass effect, evidence of mass lesion, ventriculomegaly, extra-axial collection or acute intracranial hemorrhage. Cervicomedullary junction and pituitary are within normal limits. Patchy and confluent bilateral cerebral white matter T2 and FLAIR hyperintensity is stable since 2017 and in a nonspecific configuration. No cortical encephalomalacia or chronic cerebral blood products identified. The deep gray matter nuclei,  brainstem, and cerebellum remain normal. Vascular: Major intracranial vascular flow voids are stable since 2017. Skull and upper cervical spine: Negative visible cervical spine. Visualized bone marrow signal is within normal limits. Sinuses/Orbits: Stable and negative. Other: Mastoids remain clear. Visible internal auditory structures appear normal. Scalp and face soft tissues appear negative. IMPRESSION: 1.  No acute intracranial abnormality. 2. Stable MRI appearance of the brain since 2017. Extensive chronic white matter signal changes, nonspecific but most commonly due to chronic small vessel disease. Electronically Signed   By: Odessa Fleming M.D.   On: 12/16/2017 07:30   Labs: Basic Metabolic Panel: Recent Labs  Lab 12/16/17 0009  NA 139  K 3.5  CL 105  CO2 23  GLUCOSE 121*  BUN 10  CREATININE 0.85  CALCIUM 9.5   Liver Function Tests: Recent Labs  Lab 12/16/17 0009  AST 19  ALT 12  ALKPHOS 78  BILITOT 0.7  PROT 8.0  ALBUMIN 5.0   CBC: Recent Labs  Lab 12/16/17 0009  WBC 6.2  NEUTROABS 3.2  HGB 13.4  HCT 42.6  MCV 91.4  PLT 300   Cardiac Enzymes: Recent Labs  Lab 12/16/17 0009 12/16/17 0634 12/16/17 1145  TROPONINI <0.03 <0.03 <0.03   BNP: BNP (last 3 results) Recent Labs    10/31/17 2046 12/16/17 0009  BNP 353.0* 337.0*    Signed:  Vassie Loll MD.  [facility] 12/16/2017, 1:13 PM

## 2017-12-16 NOTE — H&P (Signed)
[facility] H&P   Patient Demographics:    Katherine Lane, is a 84 y.o. female  MRN: [id number]   DOB - Jun 19, 1932  Admit Date - 12/15/2017  Outpatient Primary MD for the patient is Gerda Diss Vilinda Blanks, MD  Referring MD/NP/PA: Devoria Albe  Outpatient Specialists:  Dr. Purvis Sheffield   Patient coming from: home  Chief Complaint  Patient presents with  . Hypertension      HPI:    Katherine Lane  is a 84 y.o. female, w hx of hypertension,  CHF (diastolic), Pafib,  Migraines apparently c/o visual disturbance to ED, however, she is pointing to pressure over her bilateral temples.  She also states that she had "tunnel vision"  And that this started at 4pm 12/15/2017.  Pt noted that her bp was high and decided to come to ED.  Pt denies dysarthria, focal weakness, numbness, tingling, cp, palp, sob, n/v, diarrhea, brbpr.    In Ed,  Bp 230/99  T 98.4, P 61, Bp 174/74 (after hydralazine 10mg  iv x2,), pox 96%  CT brain IMPRESSION: 1. No acute intracranial hemorrhage. 2. Age-related atrophy and chronic microvascular ischemic changes.  Na 139, K 3.5, Bun 10, Creatinine 0.85  Wbc 6.2, Hgb 13.4, Plt 300  Trop <0.03   BNP 337  Per RN hr dropped to 37 ?  Pt will be admitted for hypertensive urgency and visual disturbance r/o CVA.     Review of systems:    In addition to the HPI above,  No Fever-chills, No Headache, No changes with hearing, No problems swallowing food or Liquids, No Chest pain, Cough or Shortness of Breath, No Abdominal pain, No Nausea or Vommitting, Bowel movements are regular, No Blood in stool or Urine, No dysuria, No new skin rashes or bruises, No new joints pains-aches,  No new weakness, tingling, numbness in any extremity, No recent weight gain or loss, No polyuria, polydypsia or polyphagia, No significant Mental Stressors.  A full 10 point Review of Systems  was done, except as stated above, all other Review of Systems were negative.   With Past History of the following :    Past Medical History:  Diagnosis Date  . A-fib (HCC)   . Anxiety   . Arthritis   . CHF (congestive heart failure) (HCC)   . Chronic back pain   . Constipation   . Constipation, chronic   . Depression   . Family history of adverse reaction to anesthesia    Mother, Sister, daughter- N/V  . History of blood transfusion    "pregnancy"  . HTN (hypertension)   . Insomnia   . Migraine headache   . Osteoporosis   . PONV (postoperative nausea and vomiting)    "violent"  . Pulmonary edema 06/2016  . Sodium (Na) deficiency   . Trigeminal neuralgia       Past Surgical History:  Procedure Laterality Date  .  ANKLE FRACTURE SURGERY Right   . CARDIAC CATHETERIZATION     denies  . CATARACT EXTRACTION W/PHACO Right 07/22/2012   Procedure: CATARACT EXTRACTION PHACO AND INTRAOCULAR LENS PLACEMENT (IOC);  Surgeon: Gemma Payor, MD;  Location: [facility];  Service: Ophthalmology;  Laterality: Right;  CDE:  18.93  . CATARACT EXTRACTION W/PHACO Left 08/09/2012   Procedure: CATARACT EXTRACTION PHACO AND INTRAOCULAR LENS PLACEMENT (IOC);  Surgeon: Gemma Payor, MD;  Location: [facility];  Service: Ophthalmology;  Laterality: Left;  CDE: 17.21  . COLONOSCOPY    . HARDWARE REMOVAL  07/18/2011   Procedure: HARDWARE REMOVAL; Right leg-  Surgeon: Vickki Hearing, MD;  Location: [facility];  Service: Orthopedics;  Laterality: Right;  . KYPHOPLASTY N/A 12/26/2016   Procedure: Lumbar two Lumbar three and Lumbar five KYPHOPLASTY;  Surgeon: Maeola Harman, MD;  Location: [facility] OR;  Service: Neurosurgery;  Laterality: N/A;  . KYPHOPLASTY N/A 02/19/2017   Procedure: KYPHOPLASTY LUMBAR ONE;  Surgeon: Maeola Harman, MD;  Location: [facility] OR;  Service: Neurosurgery;  Laterality: N/A;  KYPHOPLASTY LUMBAR ONE  . NERVE REPAIR  07/18/2011   Procedure: NERVE REPAIR;  Surgeon: Vickki Hearing, MD;  Location: [facility];   Service: Orthopedics;  Laterality: Right;  Right leg superficial peroneal nerve release    . PARTIAL HYSTERECTOMY        Social History:     Social History   Tobacco Use  . Smoking status: Never Smoker  . Smokeless tobacco: Never Used  Substance Use Topics  . Alcohol use: No    Alcohol/week: 0.0 standard drinks     Lives - at home  Mobility - walks by self   Family History :     Family History  Problem Relation Age of Onset  . Hypertension Mother   . Alzheimer's disease Mother   . Cancer Mother   . Hypertension Father   . Other Father        abused pain meds  . Diabetes Brother   . Other Brother        back problems  . Diabetes Daughter   . Anesthesia problems Neg Hx   . Hypotension Neg Hx   . Malignant hyperthermia Neg Hx   . Pseudochol deficiency Neg Hx        Home Medications:   Prior to Admission medications   Medication Sig Start Date End Date Taking? Authorizing Provider  ANALPRAM-HC 1-1 % rectal cream APPLY TO AFFECTED AREAS TWICE DAILY. Patient taking differently: Place 1 application rectally 2 (two) times daily as needed for hemorrhoids or anal itching.  07/01/16   Adline Potter, NP  Carboxymethylcellul-Glycerin (LUBRICATING EYE DROPS OP) Place 1 drop into both eyes 3 (three) times daily.     [provider]  clobetasol (TEMOVATE) 0.05 % external solution APPLY TO AFFECTED AREAS TWICE DAILY. 12/02/17   Merlyn Albert, MD  hydrALAZINE (APRESOLINE) 50 MG tablet Take 1 tablet (50 mg total) by mouth 2 (two) times daily. 11/01/17   Shon Hale, MD  HYDROmorphone (DILAUDID) 2 MG tablet Take 2 mg by mouth daily as needed for moderate pain.  08/19/17   [provider]  lidocaine (XYLOCAINE) 5 % ointment Apply 1 application topically 3 (three) times daily as needed. 10/27/17   Adline Potter, NP  LORazepam (ATIVAN) 0.5 MG tablet One up to BID 12/02/17   Merlyn Albert, MD  losartan (COZAAR) 50 MG tablet Take 1 tablet (50 mg  total) by mouth 2 (two) times daily. 11/01/17  Shon Hale, MD  metoprolol succinate (TOPROL XL) 25 MG 24 hr tablet Take 1 tablet (25 mg total) by mouth daily. Take in addition to the 50 mg tablet daily for a total of 75 mg daily 11/01/17   Shon Hale, MD  metoprolol succinate (TOPROL XL) 50 MG 24 hr tablet Take 1 tablet (50 mg total) by mouth daily. Take with or immediately following a meal. Take 1 tablet (25 mg total) by mouth daily. Take in addition to the 50 mg tablet daily for a total of 75 mg daily 11/01/17 11/01/18  Shon Hale, MD  metoprolol tartrate (LOPRESSOR) 25 MG tablet Take 1 tablet (25 mg total) by mouth every 12 (twelve) hours as needed. For palpitations and fast heart rate 11/01/17 11/01/18  Emokpae, Courage, MD  pantoprazole (PROTONIX) 40 MG tablet TAKE 1 TABLET BY MOUTH ONCE A DAY. Patient taking differently: Take 40 mg by mouth daily as needed (for acid reflux).  09/25/17   Laqueta Linden, MD  polyethylene glycol powder (MIRALAX) powder Take 17 g daily as needed by mouth for moderate constipation.     [provider]  rivaroxaban (XARELTO) 20 MG TABS tablet TAKE 1 TABLET DAILY WITH SUPPER. Patient taking differently: Take 20 mg by mouth every evening. . 06/25/17   Laqueta Linden, MD  zolpidem (AMBIEN) 10 MG tablet TAKE 1 TABLET BY MOUTH AT BEDTIME FOR SLEEP. 12/02/17   Merlyn Albert, MD     Allergies:     Allergies  Allergen Reactions  . Amlodipine Swelling    Leg swelling   . Codeine Nausea And Vomiting and Other (See Comments)    Patient states "intolerance to all pain medications"  (NO OPIOIDS) VERY VIOLENT VOMITING!!  . Other Nausea And Vomiting and Other (See Comments)    general anesthesia  . Tramadol Nausea And Vomiting and Other (See Comments)    "NO OPIOIDS!!! VERY VIOLENT VOMITING!!" per Med History prior to 02/18/17     Physical Exam:   Vitals  Blood pressure (!) 141/55, pulse (!) 47, temperature 98.4 F (36.9 C),  temperature source Oral, resp. rate 16, height 5\' 2"  (1.575 m), weight 64 kg, SpO2 96 %.   1. General  lying in bed in NAD,   2. Normal affect and insight, Not Suicidal or Homicidal, Awake Alert, Oriented X 3.  3. No F.N deficits, ALL C.Nerves Intact, Strength 5/5 all 4 extremities, Sensation intact all 4 extremities, Plantars down going.  4. Ears and Eyes appear Normal, Conjunctivae clear, pupils 1.10mm symmetric, PERRLA. Moist Oral Mucosa.  5. Supple Neck, No JVD, No cervical lymphadenopathy appriciated, No Carotid Bruits.  6. Symmetrical Chest wall movement, Good air movement bilaterally, CTAB.  7. RRR, No Gallops, Rubs or Murmurs, No Parasternal Heave.  8. Positive Bowel Sounds, Abdomen Soft, No tenderness, No organomegaly appriciated,No rebound -guarding or rigidity.  9.  No Cyanosis, Normal Skin Turgor, No Skin Rash or Bruise.  10. Good muscle tone,  joints appear normal , no effusions, Normal ROM.  11. No Palpable Lymph Nodes in Neck or Axillae     Data Review:    CBC Recent Labs  Lab 12/16/17 0009  WBC 6.2  HGB 13.4  HCT 42.6  PLT 300  MCV 91.4  MCH 28.8  MCHC 31.5  RDW 13.8  LYMPHSABS 2.5  MONOABS 0.5  EOSABS 0.0  BASOSABS 0.0   ------------------------------------------------------------------------------------------------------------------  Chemistries  Recent Labs  Lab 12/16/17 0009  NA 139  K 3.5  CL 105  CO2 23  GLUCOSE 121*  BUN 10  CREATININE 0.85  CALCIUM 9.5  AST 19  ALT 12  ALKPHOS 78  BILITOT 0.7   ------------------------------------------------------------------------------------------------------------------ estimated creatinine clearance is 44.1 mL/min (by C-G formula based on SCr of 0.85 mg/dL). ------------------------------------------------------------------------------------------------------------------ No results for input(s): TSH, T4TOTAL, T3FREE, THYROIDAB in the last 72 hours.  Invalid input(s):  FREET3  Coagulation profile No results for input(s): INR, PROTIME in the last 168 hours. ------------------------------------------------------------------------------------------------------------------- No results for input(s): DDIMER in the last 72 hours. -------------------------------------------------------------------------------------------------------------------  Cardiac Enzymes Recent Labs  Lab 12/16/17 0009  TROPONINI <0.03   ------------------------------------------------------------------------------------------------------------------    Component Value Date/Time   BNP 337.0 (H) 12/16/2017 0009     ---------------------------------------------------------------------------------------------------------------  Urinalysis    Component Value Date/Time   COLORURINE STRAW (A) 07/10/2016 1200   APPEARANCEUR CLEAR 07/10/2016 1200   APPEARANCEUR Cloudy (A) 06/20/2016 1400   LABSPEC 1.004 (L) 07/10/2016 1200   PHURINE 6.0 07/10/2016 1200   GLUCOSEU NEGATIVE 07/10/2016 1200   HGBUR SMALL (A) 07/10/2016 1200   BILIRUBINUR NEGATIVE 07/10/2016 1200   BILIRUBINUR Negative 06/20/2016 1400   KETONESUR NEGATIVE 07/10/2016 1200   PROTEINUR NEGATIVE 07/10/2016 1200   UROBILINOGEN 0.2 03/17/2016 1043   UROBILINOGEN 0.2 02/15/2009 1217   NITRITE NEGATIVE 07/10/2016 1200   LEUKOCYTESUR Trace (A) 01/07/2017 1447   LEUKOCYTESUR 3+ (A) 06/20/2016 1400    ----------------------------------------------------------------------------------------------------------------   Imaging Results:    Ct Head Wo Contrast  Result Date: 12/16/2017 CLINICAL DATA:  84 year old female with hypertension and headache. EXAM: CT HEAD WITHOUT CONTRAST TECHNIQUE: Contiguous axial images were obtained from the base of the skull through the vertex without intravenous contrast. COMPARISON:  Head CT dated 06/30/2016 FINDINGS: Brain: There is mild age-related atrophy and moderate chronic microvascular  ischemic changes. There is no acute intracranial hemorrhage. No mass effect or midline shift. No extra-axial fluid collection. Vascular: No hyperdense vessel or unexpected calcification. Skull: Normal. Negative for fracture or focal lesion. Sinuses/Orbits: No acute finding. Other: None IMPRESSION: 1. No acute intracranial hemorrhage. 2. Age-related atrophy and chronic microvascular ischemic changes. Electronically Signed   By: Elgie Collard M.D.   On: 12/16/2017 04:28    ekg nsr at 80, nl axis, poor R progression    Assessment & Plan:    Principal Problem:   Hypertensive urgency    Vision disturbance r/o CVA vs hypertensive urgency/ emergency MRI brain   Hypertensive urgency Increase hydralazine to 50mg  po tid Cont Losartan 50mg  po qday Hydralazine 10mg  iv q6h prn sbp >160  Bradycardia Decrease Toprol XL from 75mg  to 50mg  po qday  (hold for hr <60)  Pafib Cont Xarelto  Gerd Cont PPI  Anxiety Cont Ativan    DVT Prophylaxis Xarelto , SCDs  AM Labs Ordered, also please review Full Orders  Family Communication: Admission, patients condition and plan of care including tests being ordered have been discussed with the patient who indicate understanding and agree with the plan and Code Status.  Code Status  FULL CODE  Likely DC to  home  Condition GUARDED    Consults called: none  Admission status: observation   Time spent in minutes : 84   Pearson Grippe M.D on 12/16/2017 at 5:37 AM  Between 7am to 7pm - Pager - [phone]   After 7pm go to [email or web address] - password [facility]  [facility] - Office  [phone]

## 2017-12-16 NOTE — Care Management Obs Status (Signed)
MEDICARE OBSERVATION STATUS NOTIFICATION   Patient Details  Name: Katherine Lane MRN: [id number] Date of Birth: 03/09/1932   Medicare Observation Status Notification Given:  Yes    Renie Ora 12/16/2017, 1:17 PM

## 2017-12-22 ENCOUNTER — Ambulatory Visit: Payer: Medicare Other | Admitting: Cardiovascular Disease

## 2017-12-22 ENCOUNTER — Encounter: Payer: Self-pay | Admitting: Cardiovascular Disease

## 2017-12-22 VITALS — BP 144/84 | HR 80 | Ht 63.0 in | Wt 139.0 lb

## 2017-12-22 DIAGNOSIS — Z9289 Personal history of other medical treatment: Secondary | ICD-10-CM

## 2017-12-22 DIAGNOSIS — I4811 Longstanding persistent atrial fibrillation: Secondary | ICD-10-CM | POA: Diagnosis not present

## 2017-12-22 DIAGNOSIS — I5032 Chronic diastolic (congestive) heart failure: Secondary | ICD-10-CM

## 2017-12-22 DIAGNOSIS — I10 Essential (primary) hypertension: Secondary | ICD-10-CM | POA: Diagnosis not present

## 2017-12-22 MED ORDER — HYDRALAZINE HCL 50 MG PO TABS: 50.0000 mg | ORAL_TABLET | Freq: Four times a day (QID) | ORAL | 1 refills | Status: DC

## 2017-12-22 NOTE — Progress Notes (Signed)
SUBJECTIVE: The patient presents for follow-up after being hospitalized for hypertensive urgency.  She was discharged on 12/16/2017. Blood pressure was 230/99 in the ED.  She was given several doses of IV hydralazine.  Head CT showed no acute intracranial hemorrhage. She was discharged on Toprol-XL 50 mg daily, hydralazine 50 mg 3 times daily, and Cozaar 75 mg once daily.  She was bradycardic with a heart rate in the 30 to 40 bpm range around bedtime and Toprol-XL was reduced from 75 to 50 mg daily for this reason.  Echocardiogram 11/01/2017 showed vigorous left ventricular systolic function, LVEF 65 to 16%, mild LVH, high ventricular filling pressures, mild aortic regurgitation, mild ascending aortic dilatation, and mild mitral and tricuspid regurgitation.  Pulmonary pressures were mild to moderately elevated, 41 mmHg.  She feels slightly tired today but otherwise is doing well overall.  She denies chest pain, palpitations, shortness of breath.  She finds that her blood pressure gets up to 175/94 in the late afternoon/early evening hours and she takes an extra 50 mg of hydralazine.  This tends to bring it down.  Renal function was normal on 12/16/2017, BUN 10 and creatinine 0.85.  Review of Systems: As per "subjective", otherwise negative.  Allergies  Allergen Reactions  . Amlodipine Swelling    Leg swelling   . Codeine Nausea And Vomiting and Other (See Comments)    Patient states "intolerance to all pain medications"  (NO OPIOIDS) VERY VIOLENT VOMITING!!  . Other Nausea And Vomiting and Other (See Comments)    general anesthesia  . Tramadol Nausea And Vomiting and Other (See Comments)    "NO OPIOIDS!!! VERY VIOLENT VOMITING!!" per Med History prior to 02/18/17    Current Outpatient Medications  Medication Sig Dispense Refill  . ANALPRAM-HC 1-1 % rectal cream APPLY TO AFFECTED AREAS TWICE DAILY. (Patient taking differently: Place 1 application rectally 2 (two) times daily as  needed for hemorrhoids or anal itching. ) 28.4 g prn  . Carboxymethylcellul-Glycerin (LUBRICATING EYE DROPS OP) Place 1 drop into both eyes 3 (three) times daily.     . clobetasol (TEMOVATE) 0.05 % external solution APPLY TO AFFECTED AREAS TWICE DAILY. 50 mL 3  . hydrALAZINE (APRESOLINE) 50 MG tablet Take 1 tablet (50 mg total) by mouth 3 (three) times daily. 90 tablet 1  . HYDROmorphone (DILAUDID) 2 MG tablet Take 2 mg by mouth daily as needed for moderate pain.     Marland Kitchen lidocaine (XYLOCAINE) 5 % ointment Apply 1 application topically 3 (three) times daily as needed. 35.44 g 1  . LORazepam (ATIVAN) 0.5 MG tablet One up to BID 60 tablet 5  . losartan (COZAAR) 50 MG tablet Take 1.5 tablets (75 mg total) by mouth daily. 45 tablet 1  . metoprolol succinate (TOPROL XL) 50 MG 24 hr tablet Take 1 tablet (50 mg total) by mouth daily. Take with or immediately following a meal. 30 tablet 2  . pantoprazole (PROTONIX) 40 MG tablet TAKE 1 TABLET BY MOUTH ONCE A DAY. (Patient taking differently: Take 40 mg by mouth daily as needed (for acid reflux). ) 30 tablet 0  . polyethylene glycol powder (MIRALAX) powder Take 17 g daily as needed by mouth for moderate constipation.     . rivaroxaban (XARELTO) 20 MG TABS tablet TAKE 1 TABLET DAILY WITH SUPPER. (Patient taking differently: Take 20 mg by mouth every evening. Marland Kitchen) 90 tablet 2  . zolpidem (AMBIEN) 10 MG tablet TAKE 1 TABLET BY MOUTH AT BEDTIME FOR  SLEEP. 30 tablet 5   No current facility-administered medications for this visit.     Past Medical History:  Diagnosis Date  . A-fib (HCC)   . Anxiety   . Arthritis   . CHF (congestive heart failure) (HCC)   . Chronic back pain   . Constipation   . Constipation, chronic   . Depression   . Family history of adverse reaction to anesthesia    Mother, Sister, daughter- N/V  . History of blood transfusion    "pregnancy"  . HTN (hypertension)   . Insomnia   . Migraine headache   . Osteoporosis   . PONV  (postoperative nausea and vomiting)    "violent"  . Pulmonary edema 06/2016  . Sodium (Na) deficiency   . Trigeminal neuralgia     Past Surgical History:  Procedure Laterality Date  . ANKLE FRACTURE SURGERY Right   . CARDIAC CATHETERIZATION     denies  . CATARACT EXTRACTION W/PHACO Right 07/22/2012   Procedure: CATARACT EXTRACTION PHACO AND INTRAOCULAR LENS PLACEMENT (IOC);  Surgeon: Gemma Payor, MD;  Location: [facility];  Service: Ophthalmology;  Laterality: Right;  CDE:  18.93  . CATARACT EXTRACTION W/PHACO Left 08/09/2012   Procedure: CATARACT EXTRACTION PHACO AND INTRAOCULAR LENS PLACEMENT (IOC);  Surgeon: Gemma Payor, MD;  Location: [facility];  Service: Ophthalmology;  Laterality: Left;  CDE: 17.21  . COLONOSCOPY    . HARDWARE REMOVAL  07/18/2011   Procedure: HARDWARE REMOVAL; Right leg-  Surgeon: Vickki Hearing, MD;  Location: [facility];  Service: Orthopedics;  Laterality: Right;  . KYPHOPLASTY N/A 12/26/2016   Procedure: Lumbar two Lumbar three and Lumbar five KYPHOPLASTY;  Surgeon: Maeola Harman, MD;  Location: [facility] OR;  Service: Neurosurgery;  Laterality: N/A;  . KYPHOPLASTY N/A 02/19/2017   Procedure: KYPHOPLASTY LUMBAR ONE;  Surgeon: Maeola Harman, MD;  Location: [facility];  Service: Neurosurgery;  Laterality: N/A;  KYPHOPLASTY LUMBAR ONE  . NERVE REPAIR  07/18/2011   Procedure: NERVE REPAIR;  Surgeon: Vickki Hearing, MD;  Location: [facility];  Service: Orthopedics;  Laterality: Right;  Right leg superficial peroneal nerve release    . PARTIAL HYSTERECTOMY      Social History   Socioeconomic History  . Marital status: Widowed    Spouse name: Not on file  . Number of children: Not on file  . Years of education: Not on file  . Highest education level: Not on file  Occupational History  . Occupation: retired    Associate Professor: RETIRED  Social Needs  . Financial resource strain: Not on file  . Food insecurity:    Worry: Not on file    Inability: Not on file  . Transportation needs:     Medical: Not on file    Non-medical: Not on file  Tobacco Use  . Smoking status: Never Smoker  . Smokeless tobacco: Never Used  Substance and Sexual Activity  . Alcohol use: No    Alcohol/week: 0.0 standard drinks  . Drug use: No  . Sexual activity: Not Currently    Birth control/protection: Surgical    Comment: hyst  Lifestyle  . Physical activity:    Days per week: Not on file    Minutes per session: Not on file  . Stress: Not on file  Relationships  . Social connections:    Talks on phone: Not on file    Gets together: Not on file    Attends religious service: Not on file    Active member of club  or organization: Not on file    Attends meetings of clubs or organizations: Not on file    Relationship status: Not on file  . Intimate partner violence:    Fear of current or ex partner: Not on file    Emotionally abused: Not on file    Physically abused: Not on file    Forced sexual activity: Not on file  Other Topics Concern  . Not on file  Social History Narrative  . Not on file     Vitals:   12/22/17 1346  BP: (!) 144/84  Pulse: 80  SpO2: 96%  Weight: 139 lb (63 kg)  Height: 5\' 3"  (1.6 m)    Wt Readings from Last 3 Encounters:  12/22/17 139 lb (63 kg)  12/15/17 141 lb 1.5 oz (64 kg)  12/02/17 140 lb 12.8 oz (63.9 kg)     PHYSICAL EXAM General: NAD HEENT: Normal. Neck: No JVD, no thyromegaly. Lungs: Clear to auscultation bilaterally with normal respiratory effort. CV: Regular rate and rhythm, normal S1/S2, no S3/S4, soft systolic murmur along left sternal border. No pretibial or periankle edema.  Abdomen: Soft, nontender, no distention.  Neurologic: Alert and oriented.  Psych: Normal affect. Skin: Normal. Musculoskeletal: No gross deformities.    ECG: Reviewed above under Subjective   Labs: Lab Results  Component Value Date/Time   K 3.5 12/16/2017 12:09 AM   BUN 10 12/16/2017 12:09 AM   BUN 13 10/14/2017 12:50 PM   CREATININE 0.85 12/16/2017  12:09 AM   CREATININE 0.90 10/18/2014 10:38 AM   ALT 12 12/16/2017 12:09 AM   TSH 2.490 10/14/2017 12:50 PM   HGB 13.4 12/16/2017 12:09 AM   HGB 14.1 10/14/2017 12:50 PM     Lipids: Lab Results  Component Value Date/Time   LDLCALC 93 09/24/2015 08:37 AM   CHOL 177 09/24/2015 08:37 AM   TRIG 133 09/24/2015 08:37 AM   HDL 57 09/24/2015 08:37 AM       ASSESSMENT AND PLAN:  1. Long-standing persistent atrial fibrillation: Symptomatically stable.    Currently on Toprol-XL 50 mg daily.  75 mg led to bradycardia.  Left atrial size was normal by most recent echocardiogram in September 2019.  She is systemically anticoagulated with Xarelto. No changes to therapy.  2. Accelerated hypertension: Blood pressureis  only mildly elevated.She is currently prescribedhydralazine 50 mg TID (but she does take an extra 50 mg in the late afternoon/early evening),losartan 75 mg daily,and Toprol-XL 50mg  daily. She was intolerant to amlodipine in the past.   No changes today.  I will obtain renal artery Dopplers to evaluate for renal artery stenosis.  3. Chronic diastolic heart failure: To medically stable. Diuretic therapy has led to hyponatremia in the past. Continue conservative measures including sodium and fluid restriction. She takes Lasix as needed.   Disposition: Follow up 2 to 3 months   Prentice Docker, M.D., F.A.C.C.

## 2017-12-22 NOTE — Patient Instructions (Addendum)
Medication Instructions:  Your physician recommends that you continue on your current medications as directed. Please refer to the Current Medication list given to you today.   If you need a refill on your cardiac medications before your next appointment, please call your pharmacy.   Lab work: NONE If you have labs (blood work) drawn today and your tests are completely normal, you will receive your results only by: Marland Kitchen MyChart Message (if you have MyChart) OR . A paper copy in the mail If you have any lab test that is abnormal or we need to change your treatment, we will call you to review the results.  Testing/Procedures: Your physician has requested that you have a renal artery duplex at the [facility] During this test, an ultrasound is used to evaluate blood flow to the kidneys. Allow one hour for this exam. Do not eat after midnight the day before and avoid carbonated beverages. Take your medications as you usually do.    Follow-Up: At [facility], you and your health needs are our priority.  As part of our continuing mission to provide you with exceptional heart care, we have created designated Provider Care Teams.  These Care Teams include your primary Cardiologist (physician) and Advanced Practice Providers (APPs -  Physician Assistants and Nurse Practitioners) who all work together to provide you with the care you need, when you need it. You will need a follow up appointment in 3 months.  Please call our office 2 months in advance to schedule this appointment.  You may see Prentice Docker, MD or one of the following Advanced Practice Providers on your designated Care Team:   Randall An, PA-C [facility]) . Jacolyn Reedy, PA-C [facility] Office)  Any Other Special Instructions Will Be Listed Below (If Applicable). NONE

## 2018-01-06 ENCOUNTER — Encounter: Payer: Self-pay | Admitting: *Deleted

## 2018-01-06 ENCOUNTER — Ambulatory Visit: Payer: Medicare Other | Admitting: Gastroenterology

## 2018-01-06 ENCOUNTER — Encounter: Payer: Self-pay | Admitting: Gastroenterology

## 2018-01-06 ENCOUNTER — Other Ambulatory Visit: Payer: Self-pay | Admitting: *Deleted

## 2018-01-06 DIAGNOSIS — K219 Gastro-esophageal reflux disease without esophagitis: Secondary | ICD-10-CM

## 2018-01-06 DIAGNOSIS — K5909 Other constipation: Secondary | ICD-10-CM | POA: Diagnosis not present

## 2018-01-06 DIAGNOSIS — K6289 Other specified diseases of anus and rectum: Secondary | ICD-10-CM

## 2018-01-06 DIAGNOSIS — K648 Other hemorrhoids: Secondary | ICD-10-CM

## 2018-01-06 DIAGNOSIS — K625 Hemorrhage of anus and rectum: Secondary | ICD-10-CM

## 2018-01-06 NOTE — Assessment & Plan Note (Signed)
SYMPTOMS FAIRLY WELL CONTROLLED WITH MIRALAX.  CONTINUE TO MONITOR SYMPTOMS. FOLLOW UP IN 6 MOS.

## 2018-01-06 NOTE — Progress Notes (Signed)
ON RECALL  °

## 2018-01-06 NOTE — Assessment & Plan Note (Signed)
GRADE III. SYMPTOMS NOT CONTROLLED. DECLINED SURGICAL MANAGEMENT.   DRINK WATER TO KEEP YOUR URINE LIGHT YELLOW. FOLLOW A HIGH FIBER DIET.  AVOID ITEMS THAT CAUSE BLOATING & GAS. SEE INFO BELOW. USE PREPARATION H FOUR TIMES  A DAY IF NEEDED TO RELIEVE RECTAL PAIN/PRESSURE/BLEEDING. COMPLETE SIGMOIDOSCOPY TO BAND HEMORRHOIDS. HOLD XARELTO 2 DAYS BEFORE. BRING ENEMA TO PREOP TO ADMINISTER. DISCUSSED PROCEDURE, BENEFITS, RISKS, AND MANAGEMENT OF HEMORRHOID BANDING VIA FLEX SIG. PT/DAUGHTER VOICED HER UNDERSTANDING.  FOLLOW UP IN 6 MOS.

## 2018-01-06 NOTE — Assessment & Plan Note (Signed)
SYMPTOMS CONTROLLED ON PROTONIX.  CONTINUE TO MONITOR SYMPTOMS.

## 2018-01-06 NOTE — Progress Notes (Signed)
CC'D TO PCP °

## 2018-01-06 NOTE — Patient Instructions (Signed)
DRINK WATER TO KEEP YOUR URINE LIGHT YELLOW.  FOLLOW A HIGH FIBER DIET.  AVOID ITEMS THAT CAUSE BLOATING & GAS. SEE INFO BELOW.  USE PREPARATION H FOUR TIMES  A DAY IF NEEDED TO RELIEVE RECTAL PAIN/PRESSURE/BLEEDING.  COMPLETE SIGMOIDOSCOPY TO BAND HEMORRHOIDS. HOLD XARELTO 2 DAYS BEFORE. BRING ENEMA TO PREOP TO ADMINISTER.  FOLLOW UP IN 6 MOS.    POTENTIAL HEMORRHOIDAL BANDING COMPLICATIONS:  COMMON: 1. MINOR PAIN  UNCOMMON: 1. ABSCESS 2. BAND FALLS OFF 3. PROLAPSE OF HEMORRHOIDS AND PAIN 4. ULCER BLEEDING  A. USUALLY SELF-LIMITED: MAY LAST 3-5 DAYS  B. MAY REQUIRE INTERVENTION: 1-2 WEEKS AFTER INTERACTIONS 5. NECROTIZING PELVIC SEPSIS  A. SYMPTOMS: FEVER, PAIN, DIFFICULTY URINATING   High-Fiber Diet A high-fiber diet changes your normal diet to include more whole grains, legumes, fruits, and vegetables. Changes in the diet involve replacing refined carbohydrates with unrefined foods. The calorie level of the diet is essentially unchanged. The Dietary Reference Intake (recommended amount) for adult males is 38 grams per day. For adult females, it is 25 grams per day. Pregnant and lactating women should consume 28 grams of fiber per day. Fiber is the intact part of a plant that is not broken down during digestion. Functional fiber is fiber that has been isolated from the plant to provide a beneficial effect in the body.  PURPOSE  Increase stool bulk.   Ease and regulate bowel movements.   Lower cholesterol.   REDUCE RISK OF COLON CANCER  INDICATIONS THAT YOU NEED MORE FIBER  Constipation and hemorrhoids.   Uncomplicated diverticulosis (intestine condition) and irritable bowel syndrome.   Weight management.   As a protective measure against hardening of the arteries (atherosclerosis), diabetes, and cancer.   GUIDELINES FOR INCREASING FIBER IN THE DIET  Start adding fiber to the diet slowly. A gradual increase of about 5 more grams (2 slices of whole-wheat bread, 2  servings of most fruits or vegetables, or 1 bowl of high-fiber cereal) per day is best. Too rapid an increase in fiber may result in constipation, flatulence, and bloating.   Drink enough water and fluids to keep your urine clear or pale yellow. Water, juice, or caffeine-free drinks are recommended. Not drinking enough fluid may cause constipation.   Eat a variety of high-fiber foods rather than one type of fiber.   Try to increase your intake of fiber through using high-fiber foods rather than fiber pills or supplements that contain small amounts of fiber.   The goal is to change the types of food eaten. Do not supplement your present diet with high-fiber foods, but replace foods in your present diet.   INCLUDE A VARIETY OF FIBER SOURCES  Replace refined and processed grains with whole grains, canned fruits with fresh fruits, and incorporate other fiber sources. White rice, white breads, and most bakery goods contain little or no fiber.   Brown whole-grain rice, buckwheat oats, and many fruits and vegetables are all good sources of fiber. These include: broccoli, Brussels sprouts, cabbage, cauliflower, beets, sweet potatoes, white potatoes (skin on), carrots, tomatoes, eggplant, squash, berries, fresh fruits, and dried fruits.   Cereals appear to be the richest source of fiber. Cereal fiber is found in whole grains and bran. Bran is the fiber-rich outer coat of cereal grain, which is largely removed in refining. In whole-grain cereals, the bran remains. In breakfast cereals, the largest amount of fiber is found in those with "bran" in their names. The fiber content is sometimes indicated on the label.  You may need to include additional fruits and vegetables each day.   In baking, for 1 cup white flour, you may use the following substitutions:   1 cup whole-wheat flour minus 2 tablespoons.   1/2 cup white flour plus 1/2 cup whole-wheat flour.

## 2018-01-06 NOTE — Progress Notes (Signed)
Subjective:    Patient ID: Katherine Lane, female    DOB: 01/20/1933, 84 y.o.   MRN: [id number]  Merlyn Albert, MD  HPI Has RECTAL BLEEDING RARELY. TROUBLE WITH FALLING OUT AND LASY DOWN. LOTS OF RECTAL PAIN. NOT A LOT OF ITCHING, BURNING.LOTS OF SOILING. SAW SURGERY AND DOESN'T REALLY WANT TO HAVE IT DUE TO HEART PROBLEMS.PAIN MEDS MAKE HER SICK. NO ANALPRAM/LIDOCAINE IN THE PAST 3 WEEKS DUE TO HTN UNCONTROLLED AND FELT LIKE STEROIDS MAY HAVE CONTRIBUTED. HAS A LOT OF TAIL BONE PAIN. BMs: REGULAR WITH MIRALAX Q3-4 DAYS.  PT DENIES FEVER, CHILLS, HEMATEMESIS, nausea, vomiting, melena, diarrhea, CHEST PAIN, SHORTNESS OF BREATH, CHANGE IN BOWEL IN HABITS, abdominal pain, problems swallowing, problems with sedation, OR heartburn or indigestion.  Past Medical History:  Diagnosis Date  . A-fib (HCC)   . Anxiety   . Arthritis   . CHF (congestive heart failure) (HCC)   . Chronic back pain   . Constipation   . Constipation, chronic   . Depression   . Family history of adverse reaction to anesthesia    Mother, Sister, daughter- N/V  . History of blood transfusion    "pregnancy"  . HTN (hypertension)   . Insomnia   . Migraine headache   . Osteoporosis   . PONV (postoperative nausea and vomiting)    "violent"  . Pulmonary edema 06/2016  . Sodium (Na) deficiency   . Trigeminal neuralgia    Past Surgical History:  Procedure Laterality Date  . ANKLE FRACTURE SURGERY Right   . CARDIAC CATHETERIZATION     denies  . CATARACT EXTRACTION W/PHACO Right 07/22/2012   Procedure: CATARACT EXTRACTION PHACO AND INTRAOCULAR LENS PLACEMENT (IOC);  Surgeon: Gemma Payor, MD;  Location: [facility];  Service: Ophthalmology;  Laterality: Right;  CDE:  18.93  . CATARACT EXTRACTION W/PHACO Left 08/09/2012   Procedure: CATARACT EXTRACTION PHACO AND INTRAOCULAR LENS PLACEMENT (IOC);  Surgeon: Gemma Payor, MD;  Location: [facility];  Service: Ophthalmology;  Laterality: Left;  CDE: 17.21  . COLONOSCOPY    .  HARDWARE REMOVAL  07/18/2011   Procedure: HARDWARE REMOVAL; Right leg-  Surgeon: Vickki Hearing, MD;  Location: [facility];  Service: Orthopedics;  Laterality: Right;  . KYPHOPLASTY N/A 12/26/2016   Procedure: Lumbar two Lumbar three and Lumbar five KYPHOPLASTY;  Surgeon: Maeola Harman, MD;  Location: [facility] OR;  Service: Neurosurgery;  Laterality: N/A;  . KYPHOPLASTY N/A 02/19/2017   Procedure: KYPHOPLASTY LUMBAR ONE;  Surgeon: Maeola Harman, MD;  Location: [facility] OR;  Service: Neurosurgery;  Laterality: N/A;  KYPHOPLASTY LUMBAR ONE  . NERVE REPAIR  07/18/2011   Procedure: NERVE REPAIR;  Surgeon: Vickki Hearing, MD;  Location: [facility];  Service: Orthopedics;  Laterality: Right;  Right leg superficial peroneal nerve release    . PARTIAL HYSTERECTOMY     Allergies  Allergen Reactions  . Amlodipine Swelling    Leg swelling   . Codeine Nausea And Vomiting and Other (See Comments)    Patient states "intolerance to all pain medications"  (NO OPIOIDS) VERY VIOLENT VOMITING!!  . Other Nausea And Vomiting and Other (See Comments)    general anesthesia  . Tramadol Nausea And Vomiting and Other (See Comments)    "NO OPIOIDS!!! VERY VIOLENT VOMITING!!" per Med History prior to 02/18/17   Current Outpatient Medications  Medication Sig    . Carboxymethylcellul-Glycerin (LUBRICATING EYE DROPS OP) Place 1 drop into both eyes 3 (three) times daily.     . clobetasol (TEMOVATE)  0.05 % external solution APPLY TO AFFECTED AREAS TWICE DAILY. (Patient taking differently: as needed. APPLY TO AFFECTED AREAS TWICE DAILY.)    . hydrALAZINE (APRESOLINE) 50 MG tablet Take 1 tablet (50 mg total) by mouth 4 (four) times daily. (Patient taking differently: Take 50 mg by mouth 3 (three) times daily. )    . HYDROmorphone (DILAUDID) 2 MG tablet Take 2 mg by mouth daily as needed for moderate pain.     Marland Kitchen. LORazepam (ATIVAN) 0.5 MG tablet One up to BID (Patient taking differently: Take 0.5 mg by mouth daily. One up to BID)    .  losartan (COZAAR) 50 MG tablet Take 1.5 tablets (75 mg total) by mouth daily.    . metoprolol succinate (TOPROL XL) 50 MG 24 hr tablet Take 1 tablet (50 mg total) by mouth daily. Take with or immediately following a meal.    . pantoprazole (PROTONIX) 40 MG tablet TAKE 1 TABLET BY MOUTH ONCE A DAYas needed (for acid reflux)    . polyethylene glycol powder (MIRALAX) powder Take 17 g daily as needed by mouth for moderate constipation.     . rivaroxaban (XARELTO) 20 MG TABS tablet Take 20 mg by mouth every evening.    . zolpidem (AMBIEN) 10 MG tablet TAKE 1 TABLET BY MOUTH AT BEDTIME FOR SLEEP.    .      .       Review of Systems PER HPI OTHERWISE ALL SYSTEMS ARE NEGATIVE.    Objective:   Physical Exam  Constitutional: She is oriented to person, place, and time. She appears well-developed and well-nourished. No distress.  HENT:  Head: Normocephalic and atraumatic.  Mouth/Throat: Oropharynx is clear and moist. No oropharyngeal exudate.  Eyes: Pupils are equal, round, and reactive to light. No scleral icterus.  Neck: Normal range of motion. Neck supple.  Cardiovascular: Normal rate and normal heart sounds.  Irregular RHYTHM  Pulmonary/Chest: Effort normal and breath sounds normal. No respiratory distress.  Abdominal: Soft. Bowel sounds are normal. She exhibits no distension. There is no tenderness.  Genitourinary: Rectal exam shows external hemorrhoid and internal hemorrhoid (grade III).  Musculoskeletal: She exhibits no edema.  Lymphadenopathy:    She has no cervical adenopathy.  Neurological: She is alert and oriented to person, place, and time.  NO  NEW FOCAL DEFICITS  Psychiatric:  SLIGHTLY ANXIOUS MOOD, NL AFFECT  Vitals reviewed.     Assessment & Plan:

## 2018-01-08 ENCOUNTER — Ambulatory Visit: Payer: Medicare Other | Admitting: Student

## 2018-01-12 ENCOUNTER — Ambulatory Visit (INDEPENDENT_AMBULATORY_CARE_PROVIDER_SITE_OTHER): Payer: Medicare Other

## 2018-01-12 DIAGNOSIS — I10 Essential (primary) hypertension: Secondary | ICD-10-CM | POA: Diagnosis not present

## 2018-01-13 ENCOUNTER — Telehealth: Payer: Self-pay | Admitting: *Deleted

## 2018-01-13 NOTE — Telephone Encounter (Signed)
Called patient with test results. No answer. Left message to call back.  

## 2018-01-13 NOTE — Telephone Encounter (Signed)
-----   Message from [place], [place] sent at 01/13/2018  8:20 AM EST ----- Covering for Dr. Purvis SheffieldKoneswaran. Despite patient's history of accelerated high blood pressure, both renal arteries are widely open. No sign of renal artery stenosis as a secondary cause for her high blood pressure. (as a side note, the ultrasound probe also picked up the origin of celiac artery and superior mesenteric artery which feed blood flow into the gut and intestines, no blockages seen there either)

## 2018-02-05 ENCOUNTER — Other Ambulatory Visit: Payer: Self-pay

## 2018-02-05 MED ORDER — HYDRALAZINE HCL 50 MG PO TABS: 50.0000 mg | ORAL_TABLET | Freq: Four times a day (QID) | ORAL | 6 refills | Status: DC

## 2018-02-05 NOTE — Telephone Encounter (Signed)
Refilled hydralazine 50 mg tid with ok for additional tab in hs

## 2018-02-15 ENCOUNTER — Telehealth: Payer: Self-pay | Admitting: Gastroenterology

## 2018-02-15 NOTE — Telephone Encounter (Signed)
Called and informed endo scheduler to cancel procedure. 

## 2018-02-15 NOTE — Telephone Encounter (Signed)
REVIEWED-NO ADDITIONAL RECOMMENDATIONS. 

## 2018-02-15 NOTE — Telephone Encounter (Signed)
Pt said it was a bad time for her right now and she wanted to cancel her procedure with SF on 1/17. She is aware that she will need another OV to reschedule, but she will call back when she's ready

## 2018-02-22 NOTE — Progress Notes (Signed)
Cardiology Office Note    Date:  02/23/2018   ID:  Katherine Lane, DOB 22-Mar-1932, MRN [id number]  PCP:  Merlyn AlbertLuking, William S, MD  Cardiologist: Prentice DockerSuresh Koneswaran, MD    Chief Complaint  Patient presents with  . Follow-up    2 month visit    History of Present Illness:    Katherine Lane is a 85 y.o. female with past medical history of chronic diastolic CHF, PAF (on Xarelto), HTN, and chronic back pain (s/p kyphoplasty on 02/19/2017) who presents to the office today for 8920-month follow-up.  She was last examined by Dr. Purvis SheffieldKoneswaran in 12/2017 for follow-up from a recently hospitalization for hypertensive urgency. She denied any chest pain or dyspnea on exertion but reported variable BP, with SBP in the 170's during the afternoon hours. BP was 144/84 at the time of her office visit. She was continued on her current medication regimen which included Toprol-XL 50mg  daily (previously had bradycardia with higher dosing), Losartan 75mg  daily, and Hydralazine 50mg  TID (with an extra dose as needed for elevated readings). Renal dopplers were obtained given her fluctuating BP and showed no evidence of renal artery stenosis.   In talking with the patient and her daughter today, she reports overall doing well from a cardiac perspective since her last office visit. She denies any recent chest pain, dyspnea on exertion, orthopnea, PND, or lower extremity edema. She does report having an occasional "flutter" in her chest but says this usually lasts for a few minutes and then resolves. She does follow her heart rate at home and reports this has overall been well controlled in the 50's to 70's with no significant bradycardia or tachycardia.  She reports good compliance with her current BP medication and typically takes Hydralazine 50 mg 4 times daily but sometimes skips her nighttime dose if BP is on the softer side (she defines this as SBP less than 130 for her). Then experiences elevated readings in the morning  when skipping her bedtime dose.   Past Medical History:  Diagnosis Date  . A-fib (HCC)   . Anxiety   . Arthritis   . CHF (congestive heart failure) (HCC)   . Chronic back pain   . Constipation   . Constipation, chronic   . Depression   . Family history of adverse reaction to anesthesia    Mother, Sister, daughter- N/V  . History of blood transfusion    "pregnancy"  . HTN (hypertension)   . Insomnia   . Migraine headache   . Osteoporosis   . PONV (postoperative nausea and vomiting)    "violent"  . Pulmonary edema 06/2016  . Sodium (Na) deficiency   . Trigeminal neuralgia     Past Surgical History:  Procedure Laterality Date  . ANKLE FRACTURE SURGERY Right   . CARDIAC CATHETERIZATION     denies  . CATARACT EXTRACTION W/PHACO Right 07/22/2012   Procedure: CATARACT EXTRACTION PHACO AND INTRAOCULAR LENS PLACEMENT (IOC);  Surgeon: Gemma PayorKerry Hunt, MD;  Location: [facility];  Service: Ophthalmology;  Laterality: Right;  CDE:  18.93  . CATARACT EXTRACTION W/PHACO Left 08/09/2012   Procedure: CATARACT EXTRACTION PHACO AND INTRAOCULAR LENS PLACEMENT (IOC);  Surgeon: Gemma PayorKerry Hunt, MD;  Location: [facility];  Service: Ophthalmology;  Laterality: Left;  CDE: 17.21  . COLONOSCOPY    . HARDWARE REMOVAL  07/18/2011   Procedure: HARDWARE REMOVAL; Right leg-  Surgeon: Vickki HearingStanley E Harrison, MD;  Location: [facility];  Service: Orthopedics;  Laterality: Right;  . KYPHOPLASTY N/A  12/26/2016   Procedure: Lumbar two Lumbar three and Lumbar five KYPHOPLASTY;  Surgeon: Maeola Harman, MD;  Location: [facility] OR;  Service: Neurosurgery;  Laterality: N/A;  . KYPHOPLASTY N/A 02/19/2017   Procedure: KYPHOPLASTY LUMBAR ONE;  Surgeon: Maeola Harman, MD;  [facility] OR;  Service: Neurosurgery;  Laterality: N/A;  KYPHOPLASTY LUMBAR ONE  . NERVE REPAIR  07/18/2011   Procedure: NERVE REPAIR;  Surgeon: Vickki Hearing, MD;  Location: [facility];  Service: Orthopedics;  Laterality: Right;  Right leg superficial peroneal nerve release    .  PARTIAL HYSTERECTOMY      Current Medications: Outpatient Medications Prior to Visit  Medication Sig Dispense Refill  . ANALPRAM-HC 1-1 % rectal cream APPLY TO AFFECTED AREAS TWICE DAILY. 28.4 g prn  . Carboxymethylcellul-Glycerin (LUBRICATING EYE DROPS OP) Place 1 drop into both eyes 3 (three) times daily.     . clobetasol (TEMOVATE) 0.05 % external solution APPLY TO AFFECTED AREAS TWICE DAILY. (Patient taking differently: as needed. APPLY TO AFFECTED AREAS TWICE DAILY.) 50 mL 3  . hydrALAZINE (APRESOLINE) 50 MG tablet Take 1 tablet (50 mg total) by mouth 4 (four) times daily. 120 tablet 6  . HYDROmorphone (DILAUDID) 2 MG tablet Take 2 mg by mouth daily as needed for moderate pain.     Marland Kitchen lidocaine (XYLOCAINE) 5 % ointment Apply 1 application topically 3 (three) times daily as needed. 35.44 g 1  . LORazepam (ATIVAN) 0.5 MG tablet One up to BID (Patient taking differently: Take 0.5 mg by mouth daily. One up to BID) 60 tablet 5  . pantoprazole (PROTONIX) 40 MG tablet TAKE 1 TABLET BY MOUTH ONCE A DAY. (Patient taking differently: Take 40 mg by mouth daily as needed (for acid reflux). ) 30 tablet 0  . polyethylene glycol powder (MIRALAX) powder Take 17 g daily as needed by mouth for moderate constipation.     . rivaroxaban (XARELTO) 20 MG TABS tablet TAKE 1 TABLET DAILY WITH SUPPER. (Patient taking differently: Take 20 mg by mouth every evening. Marland Kitchen) 90 tablet 2  . zolpidem (AMBIEN) 10 MG tablet TAKE 1 TABLET BY MOUTH AT BEDTIME FOR SLEEP. 30 tablet 5  . losartan (COZAAR) 50 MG tablet Take 1.5 tablets (75 mg total) by mouth daily. 45 tablet 1  . metoprolol succinate (TOPROL XL) 50 MG 24 hr tablet Take 1 tablet (50 mg total) by mouth daily. Take with or immediately following a meal. 30 tablet 2   No facility-administered medications prior to visit.      Allergies:   Amlodipine; Codeine; Other; and Tramadol   Social History   Socioeconomic History  . Marital status: Widowed    Spouse name: Not  on file  . Number of children: Not on file  . Years of education: Not on file  . Highest education level: Not on file  Occupational History  . Occupation: retired    Associate Professor: RETIRED  Social Needs  . Financial resource strain: Not on file  . Food insecurity:    Worry: Not on file    Inability: Not on file  . Transportation needs:    Medical: Not on file    Non-medical: Not on file  Tobacco Use  . Smoking status: Never Smoker  . Smokeless tobacco: Never Used  Substance and Sexual Activity  . Alcohol use: No    Alcohol/week: 0.0 standard drinks  . Drug use: No  . Sexual activity: Not Currently    Birth control/protection: Surgical    Comment: hyst  Lifestyle  . Physical activity:    Days per week: Not on file    Minutes per session: Not on file  . Stress: Not on file  Relationships  . Social connections:    Talks on phone: Not on file    Gets together: Not on file    Attends religious service: Not on file    Active member of club or organization: Not on file    Attends meetings of clubs or organizations: Not on file    Relationship status: Not on file  Other Topics Concern  . Not on file  Social History Narrative  . Not on file     Family History:  The patient's family history includes Alzheimer's disease in her mother; Cancer in her mother; Diabetes in her brother and daughter; Hypertension in her father and mother; Other in her brother and father.   Review of Systems:   Please see the history of present illness.     General:  No chills, fever, night sweats or weight changes.  Cardiovascular:  No chest pain, dyspnea on exertion, edema, orthopnea, paroxysmal nocturnal dyspnea. Positive for palpitations.  Dermatological: No rash, lesions/masses Respiratory: No cough, dyspnea Urologic: No hematuria, dysuria Abdominal:   No nausea, vomiting, diarrhea, bright red blood per rectum, melena, or hematemesis Neurologic:  No visual changes, wkns, changes in mental  status. All other systems reviewed and are otherwise negative except as noted above.   Physical Exam:    VS:  BP (!) 142/68   Pulse 65   Ht 5\' 3"  (1.6 m)   Wt 137 lb (62.1 kg)   SpO2 97%   BMI 24.27 kg/m    General: Well developed, well nourished Caucasian female appearing in no acute distress. Head: Normocephalic, atraumatic, sclera non-icteric, no xanthomas, nares are without discharge.  Neck: No carotid bruits. JVD not elevated.  Lungs: Respirations regular and unlabored, without wheezes or rales.  Heart: Irregularly irregular. No S3 or S4.  No murmur, no rubs, or gallops appreciated. Abdomen: Soft, non-tender, non-distended with normoactive bowel sounds. No hepatomegaly. No rebound/guarding. No obvious abdominal masses. Msk:  Strength and tone appear normal for age. No joint deformities or effusions. Extremities: No clubbing or cyanosis. No lower extremity edema.  Distal pedal pulses are 2+ bilaterally. Neuro: Alert and oriented X 3. Moves all extremities spontaneously. No focal deficits noted. Psych:  Responds to questions appropriately with a normal affect. Skin: No rashes or lesions noted  Wt Readings from Last 3 Encounters:  02/23/18 137 lb (62.1 kg)  01/06/18 136 lb 12.8 oz (62.1 kg)  12/22/17 139 lb (63 kg)     Studies/Labs Reviewed:   EKG:  EKG is not ordered today.   Recent Labs: 10/14/2017: TSH 2.490 10/31/2017: Magnesium 2.3 12/16/2017: ALT 12; B Natriuretic Peptide 337.0; BUN 10; Creatinine, Ser 0.85; Hemoglobin 13.4; Platelets 300; Potassium 3.5; Sodium 139   Lipid Panel    Component Value Date/Time   CHOL 177 09/24/2015 0837   TRIG 133 09/24/2015 0837   HDL 57 09/24/2015 0837   CHOLHDL 3.1 09/24/2015 0837   CHOLHDL 3.1 11/07/2013 0807   VLDL 26 11/07/2013 0807   LDLCALC 93 09/24/2015 0837    Additional studies/ records that were reviewed today include:   Echocardiogram: 10/2017 Study Conclusions  - Left ventricle: The cavity size was normal.  Wall thickness was   increased in a pattern of mild LVH. Systolic function was   vigorous. The estimated ejection fraction was in the range of 65%  to 70%. Wall motion was normal; there were no regional wall   motion abnormalities. The study was not technically sufficient to   allow evaluation of LV diastolic dysfunction due to atrial   fibrillation. Doppler parameters are consistent with high   ventricular filling pressure. - Aortic valve: Trileaflet; mildly thickened leaflets. There was   mild regurgitation. - Aorta: Mild ascending aortic dilatation. Diameter 4.03 cm. - Mitral valve: There was mild regurgitation. - Atrial septum: No defect or patent foramen ovale was identified. - Tricuspid valve: There was mild regurgitation. - Pulmonary arteries: Systolic pressure was mildly to moderately   increased. PA peak pressure: 41 mm Hg (S).  Renal Dopplers: 12/2017 Summary: Largest Aortic Diameter: 2.1 cm   Renal:   Right: Normal size right kidney. Normal right Resisitive Index.        Normal cortical thickness of right kidney. RRV flow present.        No evidence of right renal artery stenosis. Left:  Normal left Resistive Index. Normal cortical thickness of the        left kidney. No evidence of left renal artery stenosis. LRV        flow present. Mesenteric: Normal Celiac artery and Superior Mesenteric artery findings. IVC is patent.   Assessment:    1. Paroxysmal atrial fibrillation (HCC)   2. Current use of long term anticoagulation   3. Chronic diastolic CHF (congestive heart failure) (HCC)   4. Essential hypertension      Plan:   In order of problems listed above:  1. Paroxysmal Atrial Fibrillation/ Current Use of Long-Term Anticoagulation - She does report occasional palpitations but says her heart rate has been well-controlled when checked at home. She is in an irregular rhythm on examination but heart rate remains well controlled in the 60's. Will remain on  Toprol-XL 50 mg daily for rate control. We briefly discussed antiarrhythmic therapy but she does not wish to be on additional medications and will continue with a rate-control strategy at this time.  - She denies any recent melena, hematochezia, or hematuria. Hemoglobin stable at 13.4 by most recent check in 11/2017. Continue Xarelto for anticoagulation.  2. Chronic Diastolic CHF - she denies any recent dyspnea on exertion, orthopnea, PND, or lower extremity edema. Appears euvolemic by examination. Continue with PRN Lasix for weight gain or worsening edema.   3. HTN - BP initially elevated to 164/70, significantly improved to 142/68 on recheck. Recent Renal Dopplers were obtained and showed no evidence of renal artery stenosis. She reports that BP has overall been stable when checked at home but she does experience variable readings when skipping her nighttime Hydralazine dose due to concerns for hypotension throughout the night. Reviewed with her parameters in regards to holding Hydralazine but recommended that she take this as prescribed with the exception of taking Hydralazine 25 mg at night instead of 50 mg if SBP less than 120. Continue Hydralazine, Toprol-XL 50 mg daily (previously had issues with bradycardia at higher dosing), and Losartan 75mg  daily. Would initially plan to titrate Losartan to 100mg  daily if additional BP control is needed in the future.     Medication Adjustments/Labs and Tests Ordered: Current medicines are reviewed at length with the patient today.  Concerns regarding medicines are outlined above.  Medication changes, Labs and Tests ordered today are listed in the Patient Instructions below. Patient Instructions  Medication Instructions:  Your physician recommends that you continue on your current medications as directed. Please refer to the Current  Medication list given to you today.  If you need a refill on your cardiac medications before your next appointment, please  call your pharmacy.   Lab work: NONE  If you have labs (blood work) drawn today and your tests are completely normal, you will receive your results only by: Marland Kitchen MyChart Message (if you have MyChart) OR . A paper copy in the mail If you have any lab test that is abnormal or we need to change your treatment, we will call you to review the results.  Testing/Procedures: NONE   Follow-Up: At [facility], you and your health needs are our priority.  As part of our continuing mission to provide you with exceptional heart care, we have created designated Provider Care Teams.  These Care Teams include your primary Cardiologist (physician) and Advanced Practice Providers (APPs -  Physician Assistants and Nurse Practitioners) who all work together to provide you with the care you need, when you need it. You will need a follow up appointment in 6 months.  Please call our office 2 months in advance to schedule this appointment.  You may see Prentice Docker, MD or one of the following Advanced Practice Providers on your designated Care Team:   Randall An, PA-C [facility]) . Jacolyn Reedy, PA-C [facility] Office)  Any Other Special Instructions Will Be Listed Below (If Applicable).  If SBP (top blood pressure number) is less than 120, only take Hydralazine 25mg  at night.  If SBP (top blood pressure number) is greater than 120, take Hydralazine as prescribed at 50mg  at night.   Thank you for choosing [facility]!    Signed, Ellsworth Lennox, PA-C  02/23/2018 2:46 PM    [facility] [address] [place], [place] [address] Phone: [phone]

## 2018-02-23 ENCOUNTER — Encounter: Payer: Self-pay | Admitting: Student

## 2018-02-23 ENCOUNTER — Ambulatory Visit: Payer: Medicare Other | Admitting: Student

## 2018-02-23 VITALS — BP 142/68 | HR 65 | Ht 63.0 in | Wt 137.0 lb

## 2018-02-23 DIAGNOSIS — I480 Paroxysmal atrial fibrillation: Secondary | ICD-10-CM | POA: Diagnosis not present

## 2018-02-23 DIAGNOSIS — I10 Essential (primary) hypertension: Secondary | ICD-10-CM | POA: Diagnosis not present

## 2018-02-23 DIAGNOSIS — Z7901 Long term (current) use of anticoagulants: Secondary | ICD-10-CM

## 2018-02-23 DIAGNOSIS — I5032 Chronic diastolic (congestive) heart failure: Secondary | ICD-10-CM | POA: Diagnosis not present

## 2018-02-23 MED ORDER — LOSARTAN POTASSIUM 50 MG PO TABS: 75.0000 mg | ORAL_TABLET | Freq: Every day | ORAL | 11 refills | Status: DC

## 2018-02-23 MED ORDER — METOPROLOL SUCCINATE ER 50 MG PO TB24: 50.0000 mg | ORAL_TABLET | Freq: Every day | ORAL | 11 refills | Status: DC

## 2018-02-23 NOTE — Patient Instructions (Addendum)
Medication Instructions:  Your physician recommends that you continue on your current medications as directed. Please refer to the Current Medication list given to you today.  If you need a refill on your cardiac medications before your next appointment, please call your pharmacy.   Lab work: NONE  If you have labs (blood work) drawn today and your tests are completely normal, you will receive your results only by: Marland Kitchen MyChart Message (if you have MyChart) OR . A paper copy in the mail If you have any lab test that is abnormal or we need to change your treatment, we will call you to review the results.  Testing/Procedures: NONE   Follow-Up: At [facility], you and your health needs are our priority.  As part of our continuing mission to provide you with exceptional heart care, we have created designated Provider Care Teams.  These Care Teams include your primary Cardiologist (physician) and Advanced Practice Providers (APPs -  Physician Assistants and Nurse Practitioners) who all work together to provide you with the care you need, when you need it. You will need a follow up appointment in 6 months.  Please call our office 2 months in advance to schedule this appointment.  You may see Prentice Docker, MD or one of the following Advanced Practice Providers on your designated Care Team:   Randall An, PA-C [facility]) . Jacolyn Reedy, PA-C [facility] Office)  Any Other Special Instructions Will Be Listed Below (If Applicable).  If SBP (top blood pressure number) is less than 120, only take Hydralazine 25mg  at night.  If SBP (top blood pressure number) is greater than 120, take Hydralazine as prescribed at 50mg  at night.   Thank you for choosing [facility]!

## 2018-03-01 ENCOUNTER — Ambulatory Visit: Payer: Medicare Other | Admitting: Family Medicine

## 2018-03-01 VITALS — BP 122/78 | HR 86 | Temp 98.0°F | Ht 63.0 in | Wt 137.8 lb

## 2018-03-01 DIAGNOSIS — J111 Influenza due to unidentified influenza virus with other respiratory manifestations: Secondary | ICD-10-CM | POA: Diagnosis not present

## 2018-03-01 DIAGNOSIS — J019 Acute sinusitis, unspecified: Secondary | ICD-10-CM | POA: Diagnosis not present

## 2018-03-01 MED ORDER — OSELTAMIVIR PHOSPHATE 75 MG PO CAPS: 75.0000 mg | ORAL_CAPSULE | Freq: Two times a day (BID) | ORAL | 0 refills | Status: DC

## 2018-03-01 MED ORDER — CEFPROZIL 500 MG PO TABS: 500.0000 mg | ORAL_TABLET | Freq: Two times a day (BID) | ORAL | 0 refills | Status: DC

## 2018-03-01 NOTE — Progress Notes (Signed)
   Subjective:    Patient ID: Katherine Lane, female    DOB: 1932-11-22, 85 y.o.   MRN: [id number]  Cough  This is a new problem. The current episode started in the past 7 days. Associated symptoms include a fever, nasal congestion, rhinorrhea and wheezing. Pertinent negatives include no chest pain, ear pain or shortness of breath. Treatments tried: otc cold meds.   Nice patient here today with her daughter The patient states that on Saturday she started feeling bad with cough congestion drainage apparently a family member did have flu over a week ago The patient relates headache on Sunday along with feeling fatigued and fever on Sunday evening No nausea or vomiting   Review of Systems  Constitutional: Positive for fever. Negative for activity change.  HENT: Positive for congestion and rhinorrhea. Negative for ear pain.   Eyes: Negative for discharge.  Respiratory: Positive for cough and wheezing. Negative for shortness of breath.   Cardiovascular: Negative for chest pain.       Objective:   Physical Exam Vitals signs and nursing note reviewed.  Constitutional:      Appearance: She is well-developed.  HENT:     Head: Normocephalic.     Nose: Nose normal.     Mouth/Throat:     Pharynx: No oropharyngeal exudate.  Neck:     Musculoskeletal: Neck supple.  Cardiovascular:     Rate and Rhythm: Normal rate.     Heart sounds: Normal heart sounds. No murmur.  Pulmonary:     Effort: Pulmonary effort is normal.     Breath sounds: Normal breath sounds. No wheezing.  Lymphadenopathy:     Cervical: No cervical adenopathy.  Skin:    General: Skin is warm and dry.           Assessment & Plan:  More than likely influenza I would not recommend flu testing because flu testing can often be negative even in the face of flu Her symptomatology kicked in on Saturday into today which is within the 48-hour range Tamiflu twice daily for the next 5 days recommended A family member was  present with her and was very adamant that the patient has had some cough and congestion ever since last Wednesday The patient states her started up over the past couple days Given this discrepancy we will go ahead and cover with an antibiotic for the next 10 days I do not feel lab work or x-rays are indicated We will recheck the patient on Friday sooner if any problems if doing significantly better at that time certainly can cancel the follow-up appointment Warning signs were discussed in detail

## 2018-03-01 NOTE — Patient Instructions (Signed)

## 2018-03-04 ENCOUNTER — Ambulatory Visit: Payer: Medicare Other | Admitting: Family Medicine

## 2018-03-04 ENCOUNTER — Encounter: Payer: Self-pay | Admitting: Family Medicine

## 2018-03-04 ENCOUNTER — Telehealth: Payer: Self-pay | Admitting: Family Medicine

## 2018-03-04 ENCOUNTER — Ambulatory Visit (HOSPITAL_COMMUNITY)
Admission: RE | Admit: 2018-03-04 | Discharge: 2018-03-04 | Disposition: A | Discharge status: Home / Self Care | Payer: Medicare Other | Source: Ambulatory Visit | Attending: Family Medicine | Admitting: Family Medicine

## 2018-03-04 VITALS — BP 132/84 | Temp 98.6°F | Ht 63.0 in | Wt 137.0 lb

## 2018-03-04 DIAGNOSIS — J111 Influenza due to unidentified influenza virus with other respiratory manifestations: Secondary | ICD-10-CM

## 2018-03-04 DIAGNOSIS — R05 Cough: Secondary | ICD-10-CM | POA: Insufficient documentation

## 2018-03-04 DIAGNOSIS — J4520 Mild intermittent asthma, uncomplicated: Secondary | ICD-10-CM

## 2018-03-04 DIAGNOSIS — R059 Cough, unspecified: Secondary | ICD-10-CM

## 2018-03-04 MED ORDER — LEVALBUTEROL TARTRATE 45 MCG/ACT IN AERO: 2.0000 | INHALATION_SPRAY | Freq: Four times a day (QID) | RESPIRATORY_TRACT | 1 refills | Status: DC | PRN

## 2018-03-04 MED ORDER — BENZONATATE 100 MG PO CAPS: 100.0000 mg | ORAL_CAPSULE | Freq: Three times a day (TID) | ORAL | 0 refills | Status: DC | PRN

## 2018-03-04 NOTE — Telephone Encounter (Signed)
levalbuterol (XOPENEX HFA) 45 MCG/ACT inhaler  Needs prior auth.  Daughter said Domenic Schwab was going to fax a form over.  They went ahead and paid for the medicine but Robbie Lis said they will reimburse the patient it PA done.  Told daughter there are no guarantees that insurance will cover even with the prior auth but that we could try.

## 2018-03-04 NOTE — Telephone Encounter (Signed)
Pt's daughter checking on status of PA.   Pt's daughter is not on DPR   She also asked if xray results are back

## 2018-03-04 NOTE — Progress Notes (Signed)
   Subjective:    Patient ID: Katherine Lane, female    DOB: 1932/07/18, 85 y.o.   MRN: [id number] Patient arrives for very protracted discussion regarding all of her symptomatology. Cough  This is a new problem. Episode onset: Diagnosed with flu this past monday. Associated symptoms include headaches. Associated symptoms comments: Cough has gotten worse, feels more congested, fever going up in the afternoons.    Temp going up in the evening  cougnh is pretty rough   Getting some up last night   tmax 103   Temp  Has gone on for the past wek prety rough    nergy level down  Appetite fair   Fluid intak e decent  bp meds currently managed by cardiologist       Review of Systems  Respiratory: Positive for cough.   Neurological: Positive for headaches.       Objective:   Physical Exam  Alert active substantial malaise.  Blood pressure good on repeat HEENT mild nasal congestion pharynx normal lungs bilateral wheezes.  No tachypnea no inspiratory crackles heart regular rate and rhythm.  Ankles without edema.   Chest x-ray see results.  No acute changes.     Assessment & Plan:  Impression influenza/bronchitis.  With substantial malaise.  Fatigue.  Ongoing fevers.  Discussed at length.  Mostly consistent with flu symptomatology.  Patient has developed impressive reactive airways.  Due to history of paroxysmal atrial fibrillation along with blood pressure issues feel that Xopenex would be a better choice for patient in terms of side effects.  Inhaler use educated and discussed.  Patient sent for x-ray.  Results as noted  Greater than 50% of this 25 minute face to face visit was spent in counseling and discussion and coordination of care regarding the above diagnosis/diagnosies

## 2018-03-04 NOTE — Telephone Encounter (Signed)
Spoke with [name] regarding Xray (no pneumonia, no fluid, no lung changes) and informed that PA takes 24-72 hours to hear back from. Informed patient daughter that we would contact her when we hear back. Pt daughter verbalized understanding.

## 2018-03-04 NOTE — Telephone Encounter (Signed)
PA attempted; awaiting response from [facility]

## 2018-03-05 ENCOUNTER — Ambulatory Visit (HOSPITAL_COMMUNITY): Admit: 2018-03-05 | Payer: Medicare Other | Admitting: Gastroenterology

## 2018-03-05 ENCOUNTER — Encounter (HOSPITAL_COMMUNITY): Payer: Self-pay

## 2018-03-05 SURGERY — SIGMOIDOSCOPY, FLEXIBLE
Anesthesia: Moderate Sedation

## 2018-03-08 ENCOUNTER — Telehealth: Payer: Self-pay | Admitting: Family Medicine

## 2018-03-08 NOTE — Telephone Encounter (Signed)
PA done for Levalbuterol; was denied by insurance. Contacted [name] and informed [name] that insurance denied this med. Robin verbalized understanding

## 2018-04-10 ENCOUNTER — Emergency Department (HOSPITAL_COMMUNITY)
Admission: EM | Admit: 2018-04-10 | Discharge: 2018-04-11 | Disposition: A | Discharge status: Home / Self Care | Payer: Medicare Other | Attending: Emergency Medicine | Admitting: Emergency Medicine

## 2018-04-10 DIAGNOSIS — E876 Hypokalemia: Secondary | ICD-10-CM | POA: Insufficient documentation

## 2018-04-10 DIAGNOSIS — I4821 Permanent atrial fibrillation: Secondary | ICD-10-CM | POA: Diagnosis not present

## 2018-04-10 DIAGNOSIS — Z7901 Long term (current) use of anticoagulants: Secondary | ICD-10-CM | POA: Diagnosis not present

## 2018-04-10 DIAGNOSIS — R002 Palpitations: Secondary | ICD-10-CM | POA: Diagnosis present

## 2018-04-10 DIAGNOSIS — I110 Hypertensive heart disease with heart failure: Secondary | ICD-10-CM | POA: Diagnosis not present

## 2018-04-10 DIAGNOSIS — I509 Heart failure, unspecified: Secondary | ICD-10-CM | POA: Diagnosis not present

## 2018-04-10 DIAGNOSIS — Z79899 Other long term (current) drug therapy: Secondary | ICD-10-CM | POA: Diagnosis not present

## 2018-04-10 NOTE — ED Triage Notes (Signed)
Pt reports increased stress. PT reports palpations. EMS called out for anxiety. PT took extra metoprolol as directed by PCP for increased heart rate. Pt says heart doesn't feel like it's racing right now. PT has had 500 ml bolus. HR now is 89- hr is irregular.

## 2018-04-11 ENCOUNTER — Other Ambulatory Visit: Payer: Self-pay

## 2018-04-11 ENCOUNTER — Encounter (HOSPITAL_COMMUNITY): Payer: Self-pay

## 2018-04-11 LAB — BASIC METABOLIC PANEL
Anion gap: 6 (ref 5–15)
BUN: 15 mg/dL (ref 8–23)
CO2: 24 mmol/L (ref 22–32)
Calcium: 8.4 mg/dL — ABNORMAL LOW (ref 8.9–10.3)
Chloride: 109 mmol/L (ref 98–111)
Creatinine, Ser: 0.77 mg/dL (ref 0.44–1.00)
GFR calc Af Amer: >60 mL/min (ref >60)
GFR calc non Af Amer: >60 mL/min (ref >60)
Glucose, Bld: 107 mg/dL — ABNORMAL HIGH (ref 70–99)
Potassium: 3.4 mmol/L — ABNORMAL LOW (ref 3.5–5.1)
Sodium: 139 mmol/L (ref 135–145)

## 2018-04-11 LAB — CBC WITH DIFFERENTIAL/PLATELET
Abs Immature Granulocytes: 0.02 10*3/uL (ref 0.00–0.07)
Basophils Absolute: 0 10*3/uL (ref 0.0–0.1)
Basophils Relative: 0 %
Eosinophils Absolute: 0 10*3/uL (ref 0.0–0.5)
Eosinophils Relative: 1 %
HCT: 37.5 % (ref 36.0–46.0)
Hemoglobin: 11.8 g/dL — ABNORMAL LOW (ref 12.0–15.0)
Immature Granulocytes: 0 %
Lymphocytes Relative: 31 %
Lymphs Abs: 1.8 10*3/uL (ref 0.7–4.0)
MCH: 29.7 pg (ref 26.0–34.0)
MCHC: 31.5 g/dL (ref 30.0–36.0)
MCV: 94.5 fL (ref 80.0–100.0)
Monocytes Absolute: 0.7 10*3/uL (ref 0.1–1.0)
Monocytes Relative: 12 %
Neutro Abs: 3.3 10*3/uL (ref 1.7–7.7)
Neutrophils Relative %: 56 %
Platelets: 293 10*3/uL (ref 150–400)
RBC: 3.97 MIL/uL (ref 3.87–5.11)
RDW: 14.1 % (ref 11.5–15.5)
WBC: 5.9 10*3/uL (ref 4.0–10.5)
nRBC: 0 % (ref 0.0–0.2)

## 2018-04-11 MED ORDER — POTASSIUM CHLORIDE CRYS ER 20 MEQ PO TBCR
40.0000 meq | EXTENDED_RELEASE_TABLET | Freq: Once | ORAL | Status: AC
  Administered 2018-04-11: 40 meq via ORAL
  Filled 2018-04-11: qty 2

## 2018-04-11 NOTE — ED Notes (Signed)
Pt daughter asking when the physician would be in. I stated that we only have one doctor here at this time and he would be with them as soon as he could, but we had several patients come in recently. Daughter asking for a specific time when they would be seen. I informed her that I could not give her an accurate time, but she would be seen as soon as possible. Pt stated "well I'm just going to get up and leave".

## 2018-04-11 NOTE — ED Notes (Signed)
Pt and daughter push call bell again requesting RN to bedside. When at bedside, Pt states she would like to see the EDP and file a complaint for having to wait to be seen since she came in by EMS and states she could get seen fasted at [facility]. RN assured Pt the ED was very busy and the EDP and seeing Pts as fast as he can. RN spoke to Press photographer and [facility] on duty and [facility] now at Pts bedside. Pts V/S and condition remain stable. Pt denying Chest Pain and SOB.

## 2018-04-11 NOTE — ED Notes (Signed)
ED Provider at bedside. 

## 2018-04-11 NOTE — Discharge Instructions (Addendum)
Continue your current medications.

## 2018-04-11 NOTE — ED Provider Notes (Signed)
[facility] EMERGENCY DEPARTMENT Provider Note   CSN: [id number] Arrival date & time: 04/10/18  2355    History   Chief Complaint Chief Complaint  Patient presents with  . Palpitations    HPI Katherine Lane is a 85 y.o. female.   The history is provided by the patient.  Palpitations  She has history of diastolic heart failure, hypertension, atrial fibrillation and comes to the ED because of increased heart rate.  She noted that her heart was beating fast at about 6 PM.  Heart rate got up to over 160.  She took an extra dose of metoprolol, but it did not seem to be helping.  She thought her potassium might be low, so she took an extra dose of potassium without any improvement.  She denies chest pain, heaviness, tightness, pressure.  She did have some mild dyspnea.  EMS arrived and gave her some IV fluids and she states that her heart rate has come down and she feels fine now.    Past Medical History:  Diagnosis Date  . A-fib (HCC)   . Anxiety   . Arthritis   . CHF (congestive heart failure) (HCC)   . Chronic back pain   . Constipation   . Constipation, chronic   . Depression   . Family history of adverse reaction to anesthesia    Mother, Sister, daughter- N/V  . History of blood transfusion    "pregnancy"  . HTN (hypertension)   . Insomnia   . Migraine headache   . Osteoporosis   . PONV (postoperative nausea and vomiting)    "violent"  . Pulmonary edema 06/2016  . Sodium (Na) deficiency   . Trigeminal neuralgia     Patient Active Problem List   Diagnosis Date Noted  . Hemorrhoids, internal, with bleeding 01/06/2018  . Hypertensive urgency 12/16/2017  . Intractable episodic headache   . Visual changes   . Depression with anxiety   . Chronic diastolic congestive heart failure (HCC) 10/31/2017  . Atypical chest pain 10/31/2017  . Compression fracture of L1 lumbar vertebra (HCC) 02/19/2017  . Lumbar compression fracture (HCC) 12/26/2016  . Gastroesophageal reflux  disease without esophagitis 07/18/2016  . Acute on chronic diastolic CHF (congestive heart failure) (HCC) 07/11/2016  . Chronic coughing 07/11/2016  . Pulmonary edema 07/10/2016  . Migraine headache 07/01/2016  . Anxiety 06/30/2016  . Altered mental status 12/06/2015  . Prolonged Q-T interval on ECG 12/06/2015  . Postural dizziness with presyncope 12/06/2015  . PAF (paroxysmal atrial fibrillation) (HCC) 07/14/2013  . Atrial flutter (HCC) 05/12/2013  . Bulging discs 04/20/2013  . Trigeminal neuralgia 01/15/2013  . Cellulitis 07/31/2011  . Mechanical complication of internal orthopedic implant (HCC) 07/21/2011  . 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 Ankle fracture 07/02/2010  . LESION OF PLANTAR NERVE 05/14/2009  . CLOSED BIMALLEOLAR FRACTURE 03/01/2009  . Essential hypertension 11/07/2008  . CONSTIPATION, CHRONIC 11/07/2008  . Osteoporosis 11/07/2008    Past Surgical History:  Procedure Laterality Date  . ANKLE FRACTURE SURGERY Right   . CARDIAC CATHETERIZATION     denies  . CATARACT EXTRACTION W/PHACO Right 07/22/2012   Procedure: CATARACT EXTRACTION PHACO AND INTRAOCULAR LENS PLACEMENT (IOC);  Surgeon: Gemma Payor, MD;  Location: [facility];  Service: Ophthalmology;  Laterality: Right;  CDE:  18.93  . CATARACT EXTRACTION W/PHACO Left 08/09/2012   Procedure: CATARACT EXTRACTION PHACO AND INTRAOCULAR LENS PLACEMENT (IOC);  Surgeon: Gemma Payor, MD;  Location: [facility];  Service: Ophthalmology;  Laterality: Left;  CDE: 17.21  . COLONOSCOPY    . HARDWARE REMOVAL  07/18/2011   Procedure: HARDWARE REMOVAL; Right leg-  Surgeon: Vickki Hearing, MD;  Location: [facility];  Service: Orthopedics;  Laterality: Right;  . KYPHOPLASTY N/A 12/26/2016   Procedure: Lumbar two Lumbar three and Lumbar five KYPHOPLASTY;  Surgeon: Maeola Harman, MD;  Location: [facility] OR;  Service: Neurosurgery;  Laterality: N/A;  . KYPHOPLASTY N/A 02/19/2017   Procedure: KYPHOPLASTY LUMBAR ONE;  Surgeon: Maeola Harman,  MD;  Location: [facility] OR;  Service: Neurosurgery;  Laterality: N/A;  KYPHOPLASTY LUMBAR ONE  . NERVE REPAIR  07/18/2011   Procedure: NERVE REPAIR;  Surgeon: Vickki Hearing, MD;  Location: [facility];  Service: Orthopedics;  Laterality: Right;  Right leg superficial peroneal nerve release    . PARTIAL HYSTERECTOMY       OB History    Gravida  3   Para  3   Term  3   Preterm      AB      Living  3     SAB      TAB      Ectopic      Multiple      Live Births  3            Home Medications    Prior to Admission medications   Medication Sig Start Date End Date Taking? Authorizing Provider  Carboxymethylcellul-Glycerin (LUBRICATING EYE DROPS OP) Place 1 drop into both eyes 3 (three) times daily.    Yes [provider]  hydrALAZINE (APRESOLINE) 50 MG tablet Take 1 tablet (50 mg total) by mouth 4 (four) times daily. 02/05/18  Yes Laqueta Linden, MD  HYDROmorphone (DILAUDID) 2 MG tablet Take 2 mg by mouth daily as needed for moderate pain.  08/19/17  Yes [provider]  levalbuterol (XOPENEX HFA) 45 MCG/ACT inhaler Inhale 2 puffs into the lungs every 6 (six) hours as needed for wheezing. 03/04/18  Yes Merlyn Albert, MD  LORazepam (ATIVAN) 0.5 MG tablet One up to BID Patient taking differently: Take 0.5 mg by mouth daily. One up to BID 12/02/17  Yes Merlyn Albert, MD  losartan (COZAAR) 50 MG tablet Take 1.5 tablets (75 mg total) by mouth daily. 02/23/18  Yes Strader, [place] M, PA-C  metoprolol succinate (TOPROL XL) 50 MG 24 hr tablet Take 1 tablet (50 mg total) by mouth daily. Take with or immediately following a meal. 02/23/18 02/23/19 Yes Strader, [place] M, PA-C  polyethylene glycol powder (MIRALAX) powder Take 17 g daily as needed by mouth for moderate constipation.    Yes [provider]  rivaroxaban (XARELTO) 20 MG TABS tablet TAKE 1 TABLET DAILY WITH SUPPER. Patient taking differently: Take 20 mg by mouth every evening. . 06/25/17  Yes  Laqueta Linden, MD  zolpidem (AMBIEN) 10 MG tablet TAKE 1 TABLET BY MOUTH AT BEDTIME FOR SLEEP. 12/02/17  Yes Merlyn Albert, MD  [facility] 1-1 % rectal cream APPLY TO AFFECTED AREAS TWICE DAILY. 07/01/16   Adline Potter, NP  clobetasol (TEMOVATE) 0.05 % external solution APPLY TO AFFECTED AREAS TWICE DAILY. Patient taking differently: as needed. APPLY TO AFFECTED AREAS TWICE DAILY. 12/02/17   Merlyn Albert, MD  lidocaine (XYLOCAINE) 5 % ointment Apply 1 application topically 3 (three) times daily as needed. 10/27/17   Adline Potter, NP  pantoprazole (PROTONIX) 40 MG tablet TAKE 1 TABLET BY MOUTH ONCE A DAY. Patient taking differently: Take 40 mg by mouth  daily as needed (for acid reflux).  09/25/17   Laqueta Linden, MD    Family History Family History  Problem Relation Age of Onset  . Hypertension Mother   . Alzheimer's disease Mother   . Cancer Mother   . Hypertension Father   . Other Father        abused pain meds  . Diabetes Brother   . Other Brother        back problems  . Diabetes Daughter   . Anesthesia problems Neg Hx   . Hypotension Neg Hx   . Malignant hyperthermia Neg Hx   . Pseudochol deficiency Neg Hx     Social History Social History   Tobacco Use  . Smoking status: Never Smoker  . Smokeless tobacco: Never Used  Substance Use Topics  . Alcohol use: No    Alcohol/week: 0.0 standard drinks  . Drug use: No     Allergies   Amlodipine; Codeine; Other; and Tramadol   Review of Systems Review of Systems  Cardiovascular: Positive for palpitations.  All other systems reviewed and are negative.    Physical Exam Updated Vital Signs BP (!) 147/102   Pulse 83   Temp 97.6 F (36.4 C) (Oral)   Resp 16   Ht 5\' 3"  (1.6 m)   Wt 60.8 kg   SpO2 94%   BMI 23.74 kg/m   Physical Exam Vitals signs and nursing note reviewed.    85 year old female, resting comfortably and in no acute distress. Vital signs are significant for  elevated blood pressure. Oxygen saturation is 94%, which is normal. Head is normocephalic and atraumatic. PERRLA, EOMI. Oropharynx is clear. Neck is nontender and supple without adenopathy or JVD. Back is nontender and there is no CVA tenderness. Lungs are clear without rales, wheezes, or rhonchi. Chest is nontender. Heart has an irregular rhythm without murmur. Abdomen is soft, flat, nontender without masses or hepatosplenomegaly and peristalsis is normoactive. Extremities have no cyanosis or edema, full range of motion is present. Skin is warm and dry without rash. Neurologic: Mental status is normal, cranial nerves are intact, there are no motor or sensory deficits.  ED Treatments / Results  Labs (all labs ordered are listed, but only abnormal results are displayed) Labs Reviewed  BASIC METABOLIC PANEL - Abnormal; Notable for the following components:      Result Value   Potassium 3.4 (*)    Glucose, Bld 107 (*)    Calcium 8.4 (*)    All other components within normal limits  CBC WITH DIFFERENTIAL/PLATELET - Abnormal; Notable for the following components:   Hemoglobin 11.8 (*)    All other components within normal limits    EKG EKG Interpretation  Date/Time:  Sunday April 11 2018 00:00:29 EST Ventricular Rate:  86 PR Interval:    QRS Duration: 92 QT Interval:  373 QTC Calculation: 447 R Axis:   68 Text Interpretation:  Atrial fibrillation Anterior infarct, old Baseline wander in lead(s) II III aVF When compared with ECG of 12/16/2017, Atrial fibrillation has replaced Sinus rhythm Confirmed by Dione Booze ([id number]) on 04/11/2018 12:13:53 AM  Procedures Procedures  Medications Ordered in ED Medications  potassium chloride SA (K-DUR,KLOR-CON) CR tablet 40 mEq (40 mEq Oral Given 04/11/18 0320)     Initial Impression / Assessment and Plan / ED Course  I have reviewed the triage vital signs and the nursing notes.  Pertinent lab results that were available during my  care of the patient were  reviewed by me and considered in my medical decision making (see chart for details).  Atrial fibrillation with initial rapid ventricular response which is now well controlled.  I cannot be sure if it was delay in metoprolol getting into her system or the IV fluids received an ambulance, but she has a well controlled ventricular response in the ED.  Screening labs do show mild hypokalemia and she is given additional dose of oral potassium.  Old records are reviewed confirming management by cardiology for persistent atrial fibrillation.  She is discharged with instructions to follow-up with her cardiologist.  Final Clinical Impressions(s) / ED Diagnoses   Final diagnoses:  Permanent atrial fibrillation  Hypokalemia    ED Discharge Orders    None       Dione Booze, MD 04/11/18 [id number]

## 2018-04-11 NOTE — ED Notes (Signed)
Patient pushed call bell and asked when EDP would be coming to see her. Daughter came to Nursing station wanting to know why her mother had not been seen since she came in be EMS. Stated to patient that EDP is making rounds and will be with her as soon as he could. Patient's daughter standing in doorway watching staff at this time

## 2018-04-13 ENCOUNTER — Telehealth: Payer: Self-pay | Admitting: Cardiovascular Disease

## 2018-04-13 NOTE — Telephone Encounter (Signed)
Patient left message on voicemail requesting to see Dr.Koneswaran today due to heart racing. States she does not want to go to ER. / tg

## 2018-04-13 NOTE — Telephone Encounter (Signed)
Returned pt call. She stated that her daughter set  Up appointments for her to speak with doctor.

## 2018-04-14 ENCOUNTER — Ambulatory Visit: Payer: Medicare Other | Admitting: Cardiovascular Disease

## 2018-04-14 ENCOUNTER — Encounter: Payer: Self-pay | Admitting: Cardiovascular Disease

## 2018-04-14 VITALS — BP 140/80 | HR 76 | Ht 63.0 in | Wt 132.8 lb

## 2018-04-14 DIAGNOSIS — Z7901 Long term (current) use of anticoagulants: Secondary | ICD-10-CM | POA: Diagnosis not present

## 2018-04-14 DIAGNOSIS — E876 Hypokalemia: Secondary | ICD-10-CM

## 2018-04-14 DIAGNOSIS — I480 Paroxysmal atrial fibrillation: Secondary | ICD-10-CM | POA: Diagnosis not present

## 2018-04-14 DIAGNOSIS — I5032 Chronic diastolic (congestive) heart failure: Secondary | ICD-10-CM

## 2018-04-14 DIAGNOSIS — I10 Essential (primary) hypertension: Secondary | ICD-10-CM

## 2018-04-14 MED ORDER — POTASSIUM CHLORIDE ER 10 MEQ PO TBCR: 10.0000 meq | EXTENDED_RELEASE_TABLET | ORAL | 3 refills | Status: DC

## 2018-04-14 NOTE — Patient Instructions (Signed)
Medication Instructions:  START POTASSIUM 10 MEQ EVERY OTHER DAY  Labwork: NONE  Testing/Procedures: NONE  Follow-Up: Your physician recommends that you schedule a follow-up appointment in: 3 MONTHS     Any Other Special Instructions Will Be Listed Below (If Applicable).     If you need a refill on your cardiac medications before your next appointment, please call your pharmacy.

## 2018-04-14 NOTE — Progress Notes (Signed)
SUBJECTIVE: The patient presents for post ED follow-up.  She was evaluated on 04/11/2018 for palpitations.  Her heart rate apparently had gotten up to over 160 for which she took an extra dose of metoprolol.  She took some potassium as well.  EMS gave her some IV fluids and her heart rate decreased and she felt fine by the time she got to the ED.  Blood pressure was 147/102.  Potassium was mildly low at 3.4.  Hemoglobin was 11.8.  I personally reviewed the ECG performed on 04/11/2018 which showed atrial fibrillation, 86 bpm.  There was late R wave transition.  She told me that her grandson, [place], may have lymphoma.  She became very tearful after telling me this.  She has been under a lot of emotional stress since finding out about this last week.  She currently denies chest pain, palpitations, shortness of breath.    Review of Systems: As per "subjective", otherwise negative.  Allergies  Allergen Reactions  . Amlodipine Swelling    Leg swelling   . Codeine Nausea And Vomiting and Other (See Comments)    Patient states "intolerance to all pain medications"  (NO OPIOIDS) VERY VIOLENT VOMITING!!  . Other Nausea And Vomiting and Other (See Comments)    general anesthesia  . Tramadol Nausea And Vomiting and Other (See Comments)    "NO OPIOIDS!!! VERY VIOLENT VOMITING!!" per Med History prior to 02/18/17    Current Outpatient Medications  Medication Sig Dispense Refill  . ANALPRAM-HC 1-1 % rectal cream APPLY TO AFFECTED AREAS TWICE DAILY. 28.4 g prn  . Carboxymethylcellul-Glycerin (LUBRICATING EYE DROPS OP) Place 1 drop into both eyes 3 (three) times daily.     . clobetasol (TEMOVATE) 0.05 % external solution APPLY TO AFFECTED AREAS TWICE DAILY. (Patient taking differently: as needed. APPLY TO AFFECTED AREAS TWICE DAILY.) 50 mL 3  . hydrALAZINE (APRESOLINE) 50 MG tablet Take 1 tablet (50 mg total) by mouth 4 (four) times daily. 120 tablet 6  . HYDROmorphone (DILAUDID) 2 MG  tablet Take 2 mg by mouth daily as needed for moderate pain.     Marland Kitchen levalbuterol (XOPENEX HFA) 45 MCG/ACT inhaler Inhale 2 puffs into the lungs every 6 (six) hours as needed for wheezing. 1 Inhaler 1  . lidocaine (XYLOCAINE) 5 % ointment Apply 1 application topically 3 (three) times daily as needed. 35.44 g 1  . LORazepam (ATIVAN) 0.5 MG tablet One up to BID (Patient taking differently: Take 0.5 mg by mouth daily. One up to BID) 60 tablet 5  . losartan (COZAAR) 50 MG tablet Take 1.5 tablets (75 mg total) by mouth daily. 45 tablet 11  . metoprolol succinate (TOPROL XL) 50 MG 24 hr tablet Take 1 tablet (50 mg total) by mouth daily. Take with or immediately following a meal. 30 tablet 11  . metoprolol tartrate (LOPRESSOR) 25 MG tablet Take 25 mg by mouth 2 (two) times daily as needed.    . pantoprazole (PROTONIX) 40 MG tablet TAKE 1 TABLET BY MOUTH ONCE A DAY. (Patient taking differently: Take 40 mg by mouth daily as needed (for acid reflux). ) 30 tablet 0  . polyethylene glycol powder (MIRALAX) powder Take 17 g daily as needed by mouth for moderate constipation.     . rivaroxaban (XARELTO) 20 MG TABS tablet TAKE 1 TABLET DAILY WITH SUPPER. (Patient taking differently: Take 20 mg by mouth every evening. Marland Kitchen) 90 tablet 2  . zolpidem (AMBIEN) 10 MG tablet TAKE 1 TABLET  BY MOUTH AT BEDTIME FOR SLEEP. 30 tablet 5   No current facility-administered medications for this visit.     Past Medical History:  Diagnosis Date  . A-fib (HCC)   . Anxiety   . Arthritis   . CHF (congestive heart failure) (HCC)   . Chronic back pain   . Constipation   . Constipation, chronic   . Depression   . Family history of adverse reaction to anesthesia    Mother, Sister, daughter- N/V  . History of blood transfusion    "pregnancy"  . HTN (hypertension)   . Insomnia   . Migraine headache   . Osteoporosis   . PONV (postoperative nausea and vomiting)    "violent"  . Pulmonary edema 06/2016  . Sodium (Na) deficiency     . Trigeminal neuralgia     Past Surgical History:  Procedure Laterality Date  . ANKLE FRACTURE SURGERY Right   . CARDIAC CATHETERIZATION     denies  . CATARACT EXTRACTION W/PHACO Right 07/22/2012   Procedure: CATARACT EXTRACTION PHACO AND INTRAOCULAR LENS PLACEMENT (IOC);  Surgeon: Gemma Payor, MD;  Location: [facility];  Service: Ophthalmology;  Laterality: Right;  CDE:  18.93  . CATARACT EXTRACTION W/PHACO Left 08/09/2012   Procedure: CATARACT EXTRACTION PHACO AND INTRAOCULAR LENS PLACEMENT (IOC);  Surgeon: Gemma Payor, MD;  Location: [facility];  Service: Ophthalmology;  Laterality: Left;  CDE: 17.21  . COLONOSCOPY    . HARDWARE REMOVAL  07/18/2011   Procedure: HARDWARE REMOVAL; Right leg-  Surgeon: Vickki Hearing, MD;  Location: [facility];  Service: Orthopedics;  Laterality: Right;  . KYPHOPLASTY N/A 12/26/2016   Procedure: Lumbar two Lumbar three and Lumbar five KYPHOPLASTY;  Surgeon: Maeola Harman, MD;  Location: [facility] OR;  Service: Neurosurgery;  Laterality: N/A;  . KYPHOPLASTY N/A 02/19/2017   Procedure: KYPHOPLASTY LUMBAR ONE;  Surgeon: Maeola Harman, MD;  Location: [facility] OR;  Service: Neurosurgery;  Laterality: N/A;  KYPHOPLASTY LUMBAR ONE  . NERVE REPAIR  07/18/2011   Procedure: NERVE REPAIR;  Surgeon: Vickki Hearing, MD;  Location: [facility];  Service: Orthopedics;  Laterality: Right;  Right leg superficial peroneal nerve release    . PARTIAL HYSTERECTOMY      Social History   Socioeconomic History  . Marital status: Widowed    Spouse name: Not on file  . Number of children: Not on file  . Years of education: Not on file  . Highest education level: Not on file  Occupational History  . Occupation: retired    Associate Professor: RETIRED  Social Needs  . Financial resource strain: Not on file  . Food insecurity:    Worry: Not on file    Inability: Not on file  . Transportation needs:    Medical: Not on file    Non-medical: Not on file  Tobacco Use  . Smoking status: Never Smoker  . Smokeless  tobacco: Never Used  Substance and Sexual Activity  . Alcohol use: No    Alcohol/week: 0.0 standard drinks  . Drug use: No  . Sexual activity: Not Currently    Birth control/protection: Surgical    Comment: hyst  Lifestyle  . Physical activity:    Days per week: Not on file    Minutes per session: Not on file  . Stress: Not on file  Relationships  . Social connections:    Talks on phone: Not on file    Gets together: Not on file    Attends religious service: Not on file  Active member of club or organization: Not on file    Attends meetings of clubs or organizations: Not on file    Relationship status: Not on file  . Intimate partner violence:    Fear of current or ex partner: Not on file    Emotionally abused: Not on file    Physically abused: Not on file    Forced sexual activity: Not on file  Other Topics Concern  . Not on file  Social History Narrative  . Not on file     Vitals:   04/14/18 1550  BP: 140/80  Pulse: 76  SpO2: 97%  Weight: 132 lb 12.8 oz (60.2 kg)  Height: 5\' 3"  (1.6 m)    Wt Readings from Last 3 Encounters:  04/14/18 132 lb 12.8 oz (60.2 kg)  04/11/18 134 lb (60.8 kg)  03/04/18 137 lb (62.1 kg)     PHYSICAL EXAM General: NAD HEENT: Normal. Neck: No JVD, no thyromegaly. Lungs: Clear to auscultation bilaterally with normal respiratory effort. CV: Regular rate and irregular rhythm, normal S1/S2, no S3, no murmur. No pretibial or periankle edema.  Abdomen: Soft, nontender, no distention.  Neurologic: Alert and oriented.  Psych: Normal affect.  Somewhat tearful. Skin: Normal. Musculoskeletal: No gross deformities.    ECG: Reviewed above under Subjective   Labs: Lab Results  Component Value Date/Time   K 3.4 (L) 04/11/2018 02:04 AM   BUN 15 04/11/2018 02:04 AM   BUN 13 10/14/2017 12:50 PM   CREATININE 0.77 04/11/2018 02:04 AM   CREATININE 0.90 10/18/2014 10:38 AM   ALT 12 12/16/2017 12:09 AM   TSH 2.490 10/14/2017 12:50 PM    HGB 11.8 (L) 04/11/2018 02:04 AM   HGB 14.1 10/14/2017 12:50 PM     Lipids: Lab Results  Component Value Date/Time   LDLCALC 93 09/24/2015 08:37 AM   CHOL 177 09/24/2015 08:37 AM   TRIG 133 09/24/2015 08:37 AM   HDL 57 09/24/2015 08:37 AM     Echocardiogram 11/01/2017 showed vigorous left ventricular systolic function, LVEF 65 to 29%, mild LVH, high ventricular filling pressures, mild aortic regurgitation, mild ascending aortic dilatation, and mild mitral and tricuspid regurgitation. Pulmonary pressures were mild to moderately elevated, 41 mmHg.   ASSESSMENT AND PLAN: 1.  Paroxysmal atrial fibrillation: Currently on Toprol-XL 50 mg daily.  She had issues with bradycardia with higher doses in the past.  She has deferred antiarrhythmic therapy.  Continue Xarelto for anticoagulation.  She takes 25 mg of metoprolol tartrate for heart rates which are consistently greater than 120 bpm.  2.  Hypertension: Blood pressure is borderline elevated.  She has no evidence of renal artery stenosis by renal Dopplers.  She tends to take hydralazine 4 times per day.  Also takes losartan 75 mg daily along with Toprol-XL 50 mg daily.  Intolerant to amlodipine in the past.  3.  Chronic diastolic heart failure: Euvolemic.  Diuretic therapy has led to hyponatremia in the past. Continue conservative measures including sodium and fluid restriction. She takes Lasix as needed.  4.  Hypokalemia: I have instructed her to take potassium chloride 10 mEq every other day.   Disposition: Follow up 3 months.   Prentice Docker, M.D., F.A.C.C.

## 2018-04-15 ENCOUNTER — Ambulatory Visit: Payer: Medicare Other | Admitting: Student

## 2018-04-16 ENCOUNTER — Other Ambulatory Visit: Payer: Self-pay | Admitting: Cardiovascular Disease

## 2018-05-12 ENCOUNTER — Ambulatory Visit: Payer: Medicare Other | Admitting: Cardiovascular Disease

## 2018-05-18 ENCOUNTER — Other Ambulatory Visit: Payer: Self-pay | Admitting: *Deleted

## 2018-05-18 ENCOUNTER — Telehealth: Payer: Self-pay | Admitting: Family Medicine

## 2018-05-18 MED ORDER — ONDANSETRON HCL 4 MG PO TABS: 4.0000 mg | ORAL_TABLET | Freq: Three times a day (TID) | ORAL | 2 refills | Status: DC | PRN

## 2018-05-18 NOTE — Telephone Encounter (Signed)
Left message to return call 

## 2018-05-18 NOTE — Telephone Encounter (Signed)
Last got from ER in 2018. Has a couple left. Takes a pain med she takes for her back and when she takes pain med she takes the zofran 4mg  tablet. She does not want the disolvable.

## 2018-05-18 NOTE — Telephone Encounter (Signed)
Ok numb 30 plus two ref

## 2018-05-18 NOTE — Telephone Encounter (Signed)
Med sent to pharm. Pt notified.  

## 2018-05-18 NOTE — Telephone Encounter (Signed)
Patient needing new prescription for ondansetron 4 mg for nausea called into [facility]

## 2018-05-21 ENCOUNTER — Telehealth: Payer: Self-pay | Admitting: Family Medicine

## 2018-05-21 NOTE — Telephone Encounter (Signed)
A refill was sent to the pharmacy but can not be filled for 9 days per pharmacy. Left message to return call to notify patient.

## 2018-05-21 NOTE — Telephone Encounter (Signed)
sure

## 2018-05-21 NOTE — Telephone Encounter (Signed)
Patient is requesting a refill on Ambien 10 mg not due until 4/13 but she wanting to get her done now if possible. [facility]

## 2018-05-21 NOTE — Telephone Encounter (Signed)
Patient notified

## 2018-06-07 ENCOUNTER — Telehealth: Payer: Self-pay | Admitting: Cardiovascular Disease

## 2018-06-07 ENCOUNTER — Telehealth (INDEPENDENT_AMBULATORY_CARE_PROVIDER_SITE_OTHER): Payer: Medicare Other | Admitting: Physician Assistant

## 2018-06-07 ENCOUNTER — Telehealth: Payer: Self-pay | Admitting: Physician Assistant

## 2018-06-07 VITALS — BP 132/74 | HR 86 | Wt 133.0 lb

## 2018-06-07 DIAGNOSIS — I5032 Chronic diastolic (congestive) heart failure: Secondary | ICD-10-CM

## 2018-06-07 DIAGNOSIS — I480 Paroxysmal atrial fibrillation: Secondary | ICD-10-CM

## 2018-06-07 DIAGNOSIS — I10 Essential (primary) hypertension: Secondary | ICD-10-CM | POA: Diagnosis not present

## 2018-06-07 MED ORDER — LOSARTAN POTASSIUM 50 MG PO TABS: ORAL_TABLET | ORAL | 3 refills | Status: DC

## 2018-06-07 NOTE — Telephone Encounter (Signed)
° ° °  Virtual Visit Pre-Appointment Phone Call  Steps For Call:  1. Confirm consent - "In the setting of the current Covid19 crisis, you are scheduled for a (phone or video) visit with your provider on (date) at (time).  Just as we do with many in-office visits, in order for you to participate in this visit, we must obtain consent.  If you'd like, I can send this to your mychart (if signed up) or email for you to review.  Otherwise, I can obtain your verbal consent now.  All virtual visits are billed to your insurance company just like a normal visit would be.  By agreeing to a virtual visit, we'd like you to understand that the technology does not allow for your provider to perform an examination, and thus may limit your provider's ability to fully assess your condition. If your provider identifies any concerns that need to be evaluated in person, we will make arrangements to do so.  Finally, though the technology is pretty good, we cannot assure that it will always work on either your or our end, and in the setting of a video visit, we may have to convert it to a phone-only visit.  In either situation, we cannot ensure that we have a secure connection.  Are you willing to proceed?" STAFF: Did the patient verbally acknowledge consent to telehealth visit? Document YES/NO here: Yes  2. Confirm the BEST phone number to call the day of the visit by including in appointment notes  3. Give patient instructions for WebEx/MyChart download to smartphone as below or Doximity/Doxy.me if video visit (depending on what platform provider is using)  4. Advise patient to be prepared with their blood pressure, heart rate, weight, any heart rhythm information, their current medicines, and a piece of paper and pen handy for any instructions they may receive the day of their visit  5. Inform patient they will receive a phone call 15 minutes prior to their appointment time (may be from unknown caller ID) so they should be  prepared to answer  6. Confirm that appointment type is correct in Epic appointment notes (VIDEO vs PHONE)     TELEPHONE CALL NOTE  Katherine Lane has been deemed a candidate for a follow-up tele-health visit to limit community exposure during the Covid-19 pandemic. I spoke with the patient via phone to ensure availability of phone/video source, confirm preferred email & phone number, and discuss instructions and expectations.  I reminded Katherine Lane to be prepared with any vital sign and/or heart rhythm information that could potentially be obtained via home monitoring, at the time of her visit. I reminded Katherine Lane to expect a phone call at the time of her visit if her visit.  Terry L Goins 06/07/2018 1:23 PM

## 2018-06-07 NOTE — Telephone Encounter (Signed)
[name] (daughter) called stating that her mother continues to retain fluid . Her weight continues to go up. When her weight goes up she starts having shortness of breath.   Please call [name] - [phone]_

## 2018-06-07 NOTE — Progress Notes (Signed)
Virtual Visit via Telephone Note   This visit type was conducted due to national recommendations for restrictions regarding the COVID-19 Pandemic (e.g. social distancing) in an effort to limit this patient's exposure and mitigate transmission in our community.  Due to her co-morbid illnesses, this patient is at least at moderate risk for complications without adequate follow up.  This format is felt to be most appropriate for this patient at this time.  The patient did not have access to video technology/had technical difficulties with video requiring transitioning to audio format only (telephone).  All issues noted in this document were discussed and addressed.  No physical exam could be performed with this format.  Please refer to the patient's chart for her  consent to telehealth for [facility].   Evaluation Performed:  Follow-up visit  Date:  06/07/2018   ID:  Katherine, Lane [birth year], MRN [id number]  Patient Location: Home Provider Location: Home  PCP:  Mikey Kirschner, MD  Cardiologist:  Kate Sable, MD  Electrophysiologist:  None   Chief Complaint:  High blood pressure and fluid build up  History of Present Illness:    Katherine Lane is a 85 y.o. female with history of PAF on Xarelto and Toprol with bradycardia in the past on higher doses.  She has deferred antiarrhythmic therapy.  Also has hypertension-fluctuating blood pressures with no evidence of renal artery stenosis on Dopplers,, chronic diastolic CHF with hyponatremia on diuretics in the past she continues to be treated with sodium and fluid restrictions and takes Lasix as needed.  Problems with hypokalemia and takes potassium every other day.  Patient last saw Dr. Bronson Ing 04/14/2018 after an emergency room visit for palpitations.  Her heart rate got over 160 bpm and she took extra metoprolol and potassium.  Blood pressure was 147/102.  She responded to IV fluids that EMS gave her and her heart rate  decreased.  Patient added onto my schedule today because of concerns for elevated blood pressures 2 weeks and weight up 5 pounds. Patient says BP 150/113 in the afternoon. Weight today 133 lbs after taking a Lasix 20 mg yesterday.Also having a lot of palpitations  In the afternoon when her BP goes up and also taking an extra metoprolol.Very anxious about everything.   The patient does not have symptoms concerning for COVID-19 infection (fever, chills, cough, or new shortness of breath).    Past Medical History:  Diagnosis Date  . A-fib ([place])   . Anxiety   . Arthritis   . CHF (congestive heart failure) ([place])   . Chronic back pain   . Constipation   . Constipation, chronic   . Depression   . Family history of adverse reaction to anesthesia    Mother, Sister, daughter- N/V  . History of blood transfusion    "pregnancy"  . HTN (hypertension)   . Insomnia   . Migraine headache   . Osteoporosis   . PONV (postoperative nausea and vomiting)    "violent"  . Pulmonary edema 06/2016  . Sodium (Na) deficiency   . Trigeminal neuralgia    Past Surgical History:  Procedure Laterality Date  . ANKLE FRACTURE SURGERY Right   . CARDIAC CATHETERIZATION     denies  . CATARACT EXTRACTION W/PHACO Right 07/22/2012   Procedure: CATARACT EXTRACTION PHACO AND INTRAOCULAR LENS PLACEMENT (IOC);  Surgeon: Tonny Branch, MD;  Location: [facility];  Service: Ophthalmology;  Laterality: Right;  CDE:  18.93  . CATARACT EXTRACTION W/PHACO Left 08/09/2012  Procedure: CATARACT EXTRACTION PHACO AND INTRAOCULAR LENS PLACEMENT (IOC);  Surgeon: Tonny Branch, MD;  Location: [facility];  Service: Ophthalmology;  Laterality: Left;  CDE: 17.21  . COLONOSCOPY    . HARDWARE REMOVAL  07/18/2011   Procedure: HARDWARE REMOVAL; Right leg-  Surgeon: Carole Civil, MD;  Location: [facility];  Service: Orthopedics;  Laterality: Right;  . KYPHOPLASTY N/A 12/26/2016   Procedure: Lumbar two Lumbar three and Lumbar five KYPHOPLASTY;  Surgeon:  Erline Levine, MD;  Location: [place];  Service: Neurosurgery;  Laterality: N/A;  . KYPHOPLASTY N/A 02/19/2017   Procedure: KYPHOPLASTY LUMBAR ONE;  Surgeon: Erline Levine, MD;  Location: [place];  Service: Neurosurgery;  Laterality: N/A;  KYPHOPLASTY LUMBAR ONE  . NERVE REPAIR  07/18/2011   Procedure: NERVE REPAIR;  Surgeon: Carole Civil, MD;  Location: [facility];  Service: Orthopedics;  Laterality: Right;  Right leg superficial peroneal nerve release    . PARTIAL HYSTERECTOMY       Current Meds  Medication Sig  . furosemide (LASIX) 20 MG tablet Take 20 mg by mouth as needed.     Allergies:   Amlodipine; Codeine; Other; and Tramadol   Social History   Tobacco Use  . Smoking status: Never Smoker  . Smokeless tobacco: Never Used  Substance Use Topics  . Alcohol use: No    Alcohol/week: 0.0 standard drinks  . Drug use: No     Family Hx: The patient's family history includes Alzheimer's disease in her mother; Cancer in her mother; Diabetes in her brother and daughter; Hypertension in her father and mother; Other in her brother and father. There is no history of Anesthesia problems, Hypotension, Malignant hyperthermia, or Pseudochol deficiency.  ROS:   Please see the history of present illness.    Review of Systems  Constitution: Positive for decreased appetite and weight gain.  Cardiovascular: Positive for dyspnea on exertion.  Psychiatric/Behavioral: The patient is nervous/anxious.     All other systems reviewed and are negative.   Prior CV studies:   The following studies were reviewed today:  2D echo 9/2019Study Conclusions   - Left ventricle: The cavity size was normal. Wall thickness was   increased in a pattern of mild LVH. Systolic function was   vigorous. The estimated ejection fraction was in the range of 65%   to 70%. Wall motion was normal; there were no regional wall   motion abnormalities. The study was not technically sufficient to   allow evaluation of LV  diastolic dysfunction due to atrial   fibrillation. Doppler parameters are consistent with high   ventricular filling pressure. - Aortic valve: Trileaflet; mildly thickened leaflets. There was   mild regurgitation. - Aorta: Mild ascending aortic dilatation. Diameter 4.03 cm. - Mitral valve: There was mild regurgitation. - Atrial septum: No defect or patent foramen ovale was identified. - Tricuspid valve: There was mild regurgitation. - Pulmonary arteries: Systolic pressure was mildly to moderately   increased. PA peak pressure: 41 mm Hg (S).     Labs/Other Tests and Data Reviewed:    EKG:  An ECG dated 04/12/2018 was personally reviewed today and demonstrated:  Atrial fibrillation at 86 bpm with poor R wave progression anteriorly, no acute change  Recent Labs: 10/14/2017: TSH 2.490 10/31/2017: Magnesium 2.3 12/16/2017: ALT 12; B Natriuretic Peptide 337.0 04/11/2018: BUN 15; Creatinine, Ser 0.77; Hemoglobin 11.8; Platelets 293; Potassium 3.4; Sodium 139   Recent Lipid Panel Lab Results  Component Value Date/Time   CHOL 177 09/24/2015 08:37  AM   TRIG 133 09/24/2015 08:37 AM   HDL 57 09/24/2015 08:37 AM   CHOLHDL 3.1 09/24/2015 08:37 AM   CHOLHDL 3.1 11/07/2013 08:07 AM   LDLCALC 93 09/24/2015 08:37 AM    Wt Readings from Last 3 Encounters:  06/07/18 133 lb (60.3 kg)  04/14/18 132 lb 12.8 oz (60.2 kg)  04/11/18 134 lb (60.8 kg)     Objective:    Vital Signs:  BP 132/74   Pulse 86   Wt 133 lb (60.3 kg)   BMI 23.56 kg/m      ASSESSMENT & PLAN:    1. PAF on Toprol and Xarelto-some days heart rate goes up when her blood pressure is up and she takes an extra metoprolol.  She has had trouble with bradycardia in the past so we cannot go up on her daily Toprol dose. 2. Essential hypertension patient's blood pressure has been up in the past 2 weeks- Will increase losartan to 50 mg BID. 2 gm sodium diet.  May need to increase hydralazine if blood pressure does not come down.  She  has been taking an extra hydralazine in the afternoon most days when her blood pressure is elevated. 3. Chronic diastolic CHF labs 03/30/1733 sodium 139 potassium 3.4 creatinine 0.77-Patient very anxious about her meds/ fluid/CHF/HTN-will arrange for homehealth to draw be met check on her for heart failure, A. fib and hypertension.  Follow-up with Dr. Bronson Ing phone visit in 2 weeks.  COVID-19 Education: The signs and symptoms of COVID-19 were discussed with the patient and how to seek care for testing (follow up with PCP or arrange E-visit).  The importance of social distancing was discussed today.  Time:   Today, I have spent 25 minutes with the patient with telehealth technology discussing the above problems.     Medication Adjustments/Labs and Tests Ordered: Current medicines are reviewed at length with the patient today.  Concerns regarding medicines are outlined above.   Tests Ordered: No orders of the defined types were placed in this encounter.   Medication Changes: Meds ordered this encounter  Medications  . losartan (COZAAR) 50 MG tablet    Sig: Take 50 mg  two times Daily at 7 AM and at 7 PM    Dispense:  90 tablet    Refill:  3    Disposition:  Follow up in 2 week(s) Dr. Bronson Ing phone visit(offer video if daughter is available)  Signed, Ermalinda Barrios, PA-C  06/07/2018 2:38 PM    [place]

## 2018-06-07 NOTE — Patient Instructions (Addendum)
Medication Instructions:  Your physician has recommended you make the following change in your medication:   Increase Losartan to 50 mg at 7AM and at 7PM   Take an extra Potassium 10 meq on the days that you take a Lasix  Labwork: Your physician recommends that you return for lab work in: BMET( [facility])   Testing/Procedures: None   Follow-Up: Your physician recommends that you schedule a follow-up appointment in: 2 Weeks with Dr. Purvis SheffieldKoneswaran   Any Other Special Instructions Will Be Listed Below (If Applicable).     If you need a refill on your cardiac medications before your next appointment, please call your pharmacy.  Thank you for choosing [facility]!   Low-Sodium Eating Plan Sodium, which is an element that makes up salt, helps you maintain a healthy balance of fluids in your body. Too much sodium can increase your blood pressure and cause fluid and waste to be held in your body. Your health care provider or dietitian may recommend following this plan if you have high blood pressure (hypertension), kidney disease, liver disease, or heart failure. Eating less sodium can help lower your blood pressure, reduce swelling, and protect your heart, liver, and kidneys. What are tips for following this plan? General guidelines  Most people on this plan should limit their sodium intake to 1,500-2,000 mg (milligrams) of sodium each day. Reading food labels   The Nutrition Facts label lists the amount of sodium in one serving of the food. If you eat more than one serving, you must multiply the listed amount of sodium by the number of servings.  Choose foods with less than 140 mg of sodium per serving.  Avoid foods with 300 mg of sodium or more per serving. Shopping  Look for lower-sodium products, often labeled as "low-sodium" or "no salt added."  Always check the sodium content even if foods are labeled as "unsalted" or "no salt added".  Buy  fresh foods. ? Avoid canned foods and premade or frozen meals. ? Avoid canned, cured, or processed meats  Buy breads that have less than 80 mg of sodium per slice. Cooking  Eat more home-cooked food and less restaurant, buffet, and fast food.  Avoid adding salt when cooking. Use salt-free seasonings or herbs instead of table salt or sea salt. Check with your health care provider or pharmacist before using salt substitutes.  Cook with plant-based oils, such as canola, sunflower, or olive oil. Meal planning  When eating at a restaurant, ask that your food be prepared with less salt or no salt, if possible.  Avoid foods that contain MSG (monosodium glutamate). MSG is sometimes added to [place]inese food, bouillon, and some canned foods. What foods are recommended? The items listed may not be a complete list. Talk with your dietitian about what dietary choices are best for you. Grains Low-sodium cereals, including oats, puffed wheat and rice, and shredded wheat. Low-sodium crackers. Unsalted rice. Unsalted pasta. Low-sodium bread. Whole-grain breads and whole-grain pasta. Vegetables Fresh or frozen vegetables. "No salt added" canned vegetables. "No salt added" tomato sauce and paste. Low-sodium or reduced-sodium tomato and vegetable juice. Fruits Fresh, frozen, or canned fruit. Fruit juice. Meats and other protein foods Fresh or frozen (no salt added) meat, poultry, seafood, and fish. Low-sodium canned tuna and salmon. Unsalted nuts. Dried peas, beans, and lentils without added salt. Unsalted canned beans. Eggs. Unsalted nut butters. Dairy Milk. Soy milk. Cheese that is naturally low in sodium, such as  ricotta cheese, fresh mozzarella, or Swiss cheese Low-sodium or reduced-sodium cheese. Cream cheese. Yogurt. Fats and oils Unsalted butter. Unsalted margarine with no trans fat. Vegetable oils such as canola or olive oils. Seasonings and other foods Fresh and dried herbs and spices. Salt-free  seasonings. Low-sodium mustard and ketchup. Sodium-free salad dressing. Sodium-free light mayonnaise. Fresh or refrigerated horseradish. Lemon juice. Vinegar. Homemade, reduced-sodium, or low-sodium soups. Unsalted popcorn and pretzels. Low-salt or salt-free chips. What foods are not recommended? The items listed may not be a complete list. Talk with your dietitian about what dietary choices are best for you. Grains Instant hot cereals. Bread stuffing, pancake, and biscuit mixes. Croutons. Seasoned rice or pasta mixes. Noodle soup cups. Boxed or frozen macaroni and cheese. Regular salted crackers. Self-rising flour. Vegetables Sauerkraut, pickled vegetables, and relishes. Olives. [place] fries. Onion rings. Regular canned vegetables (not low-sodium or reduced-sodium). Regular canned tomato sauce and paste (not low-sodium or reduced-sodium). Regular tomato and vegetable juice (not low-sodium or reduced-sodium). Frozen vegetables in sauces. Meats and other protein foods Meat or fish that is salted, canned, smoked, spiced, or pickled. Bacon, ham, sausage, hotdogs, corned beef, chipped beef, packaged lunch meats, salt pork, jerky, pickled herring, anchovies, regular canned tuna, sardines, salted nuts. Dairy Processed cheese and cheese spreads. Cheese curds. Blue cheese. Feta cheese. String cheese. Regular cottage cheese. Buttermilk. Canned milk. Fats and oils Salted butter. Regular margarine. Ghee. Bacon fat. Seasonings and other foods Onion salt, garlic salt, seasoned salt, table salt, and sea salt. Canned and packaged gravies. Worcestershire sauce. Tartar sauce. Barbecue sauce. Teriyaki sauce. Soy sauce, including reduced-sodium. Steak sauce. Fish sauce. Oyster sauce. Cocktail sauce. Horseradish that you find on the shelf. Regular ketchup and mustard. Meat flavorings and tenderizers. Bouillon cubes. Hot sauce and Tabasco sauce. Premade or packaged marinades. Premade or packaged taco seasonings. Relishes.  Regular salad dressings. Salsa. Potato and tortilla chips. Corn chips and puffs. Salted popcorn and pretzels. Canned or dried soups. Pizza. Frozen entrees and pot pies. Summary  Eating less sodium can help lower your blood pressure, reduce swelling, and protect your heart, liver, and kidneys.  Most people on this plan should limit their sodium intake to 1,500-2,000 mg (milligrams) of sodium each day.  Canned, boxed, and frozen foods are high in sodium. Restaurant foods, fast foods, and pizza are also very high in sodium. You also get sodium by adding salt to food.  Try to cook at home, eat more fresh fruits and vegetables, and eat less fast food, canned, processed, or prepared foods. This information is not intended to replace advice given to you by your health care provider. Make sure you discuss any questions you have with your health care provider. Document Released: 07/26/2001 Document Revised: 01/28/2016 Document Reviewed: 01/28/2016 Elsevier Interactive Patient Education  2019 [employer].

## 2018-06-07 NOTE — Telephone Encounter (Signed)
Daughter does not have weights or BP's, says for 2 weeks her BP is elevated and her weight is up 5 lbs.They would like to discuss her medications and the wish for lab work to be done.She has BP spikes in the evening and daughter feels her medications need to be changed for her BP    I spoke with patient, she will agree to telephone consult with M.Lenze PA-C today at 1:30 pm

## 2018-06-08 ENCOUNTER — Telehealth: Payer: Self-pay | Admitting: Physician Assistant

## 2018-06-08 NOTE — Telephone Encounter (Signed)
Daughter wanted to confirm that pt is to continue to take Hydralazine 4 times daily. Meds reviewed. Daughter voiced understanding.

## 2018-06-08 NOTE — Telephone Encounter (Signed)
Katherine Lane had gotten email about medication changes - she still has one more question about another medication

## 2018-06-12 ENCOUNTER — Other Ambulatory Visit: Payer: Self-pay | Admitting: Family Medicine

## 2018-06-14 ENCOUNTER — Ambulatory Visit (INDEPENDENT_AMBULATORY_CARE_PROVIDER_SITE_OTHER): Payer: Medicare Other | Admitting: Family Medicine

## 2018-06-14 ENCOUNTER — Encounter: Payer: Self-pay | Admitting: Family Medicine

## 2018-06-14 ENCOUNTER — Other Ambulatory Visit: Payer: Self-pay

## 2018-06-14 DIAGNOSIS — I10 Essential (primary) hypertension: Secondary | ICD-10-CM | POA: Diagnosis not present

## 2018-06-14 DIAGNOSIS — F5101 Primary insomnia: Secondary | ICD-10-CM | POA: Diagnosis not present

## 2018-06-14 DIAGNOSIS — F411 Generalized anxiety disorder: Secondary | ICD-10-CM

## 2018-06-14 MED ORDER — LORAZEPAM 0.5 MG PO TABS: ORAL_TABLET | ORAL | 5 refills | Status: DC

## 2018-06-14 MED ORDER — ZOLPIDEM TARTRATE 10 MG PO TABS: ORAL_TABLET | ORAL | 5 refills | Status: DC

## 2018-06-14 NOTE — Progress Notes (Signed)
   Subjective:    Patient ID: Katherine Lane, female    DOB: 1932-10-03, 85 y.o.   MRN: [id number] Format - phone  Patient present at home Provider present at office Consent for interaction obtained Coronavirus outbreak made virtual visit necessary  Hypertension  This is a chronic problem. There are no compliance problems (eats healthy, takes meds as prescribed, does a little walking for exercise).    Needs refill on ambien and ativan.   Virtual Visit via Telephone Note  I connected with Katherine Lane on 06/14/18 at  1:40 PM EDT by telephone and verified that I am speaking with the correct person using two identifiers.   I discussed the limitations, risks, security and privacy concerns of performing an evaluation and management service by telephone and the availability of in person appointments. I also discussed with the patient that there may be a patient responsible charge related to this service. The patient expressed understanding and agreed to proceed.   History of Present Illness:    Observations/Objective:   Assessment and Plan:   Follow Up Instructions:    I discussed the assessment and treatment plan with the patient. The patient was provided an opportunity to ask questions and all were answered. The patient agreed with the plan and demonstrated an understanding of the instructions.   The patient was advised to call back or seek an in-person evaluation if the symptoms worsen or if the condition fails to improve as anticipated.  I provided  minutes of non-face-to-face time during this encounter.  Patient compliant with insomnia medication. Generally takes most nights. No obvious morning drowsiness. Definitely helps patient sleep. Without it patient states would not get a good nights rest.  Patient notes she has anxiety at times.  Compliant with medication.  Medicine definitely helps.  Continues to struggle with her blood pressure.  Please see prior note.  Blood  pressure meds just adjusted.  Patient states compliance with.  Currently numbers are looking better.  Most days.   Review of Systems No headache, no major weight loss or weight gain, no chest pain no back pain abdominal pain no change in bowel habits complete ROS otherwise negative     Objective:   Physical Exam  Virtual visit      Assessment & Plan:  Impression insomnia.  Ambien helps well.  Good control.  Compliant.  No side effects.  Definitely needs will maintain.  2.  Chronic anxiety.  Not interested in any regular medication.  Uses PRN clonazepam.  May continue to use potential side effects discussed  3.  Hypertension discussed followed elsewhere now.  Patient maintain same meds.  Greater than 50% of this 25 minute face to face visit was spent in counseling and discussion and coordination of care regarding the above diagnosis/diagnosies

## 2018-06-14 NOTE — Telephone Encounter (Signed)
plz take off , since done elsewhere

## 2018-06-15 ENCOUNTER — Telehealth: Payer: Self-pay | Admitting: Cardiovascular Disease

## 2018-06-15 NOTE — Telephone Encounter (Signed)
Returned pt call. She stated that she will take the medication until she has her virtual visit with Dr. Konrad Dolores on 5/2.

## 2018-06-15 NOTE — Telephone Encounter (Signed)
Pt is having side effects from her hydrALAZINE (APRESOLINE) 50 MG tablet [[id number]]  No appetite and a racing heart rate in the afternoon

## 2018-06-22 ENCOUNTER — Encounter: Payer: Self-pay | Admitting: Cardiovascular Disease

## 2018-06-22 ENCOUNTER — Other Ambulatory Visit: Payer: Self-pay | Admitting: Cardiovascular Disease

## 2018-06-22 ENCOUNTER — Telehealth (INDEPENDENT_AMBULATORY_CARE_PROVIDER_SITE_OTHER): Payer: Medicare Other | Admitting: Cardiovascular Disease

## 2018-06-22 ENCOUNTER — Encounter: Payer: Self-pay | Admitting: Gastroenterology

## 2018-06-22 VITALS — BP 144/87 | HR 88 | Ht 63.0 in

## 2018-06-22 DIAGNOSIS — E876 Hypokalemia: Secondary | ICD-10-CM

## 2018-06-22 DIAGNOSIS — I5032 Chronic diastolic (congestive) heart failure: Secondary | ICD-10-CM

## 2018-06-22 DIAGNOSIS — I480 Paroxysmal atrial fibrillation: Secondary | ICD-10-CM | POA: Diagnosis not present

## 2018-06-22 DIAGNOSIS — R630 Anorexia: Secondary | ICD-10-CM

## 2018-06-22 DIAGNOSIS — I10 Essential (primary) hypertension: Secondary | ICD-10-CM

## 2018-06-22 DIAGNOSIS — Z7901 Long term (current) use of anticoagulants: Secondary | ICD-10-CM

## 2018-06-22 NOTE — Progress Notes (Signed)
Virtual Visit via Telephone Note   This visit type was conducted due to national recommendations for restrictions regarding the COVID-19 Pandemic (e.g. social distancing) in an effort to limit this patient's exposure and mitigate transmission in our community.  Due to her co-morbid illnesses, this patient is at least at moderate risk for complications without adequate follow up.  This format is felt to be most appropriate for this patient at this time.  The patient did not have access to video technology/had technical difficulties with video requiring transitioning to audio format only (telephone).  All issues noted in this document were discussed and addressed.  No physical exam could be performed with this format.  Please refer to the patient's chart for her  consent to telehealth for [facility].   Date:  06/22/2018   ID:  Katherine Lane, DOB 05/04/1932, MRN [id number]  Patient Location: Home Provider Location: Home  PCP:  Merlyn Albert, MD  Cardiologist:  Prentice Docker, MD  Electrophysiologist:  None   Evaluation Performed:  Follow-Up Visit  Chief Complaint:  Atrial fibrillation  History of Present Illness:    Katherine Lane is a 85 y.o. female with PAF and HTN.  Had a telehealth visit with Leda Gauze PA-C on 06/07/18 for HTN and weight gain as well as palpitations.  She a long h/o anxiety as well.  Losartan was increased to 50 mg bid.   Labs 06/11/18: BUN 16, SCr 0.87, Na 136, K 3.9.  She has good and bad days. She's been staying in. Her BP has been under better control. She's lost 5 lbs and thinks the meds are affecting her appetite. However, it does control BP within an hour of taking it.  She denies leg swelling. She's been taking walks.  The patient does not have symptoms concerning for COVID-19 infection (fever, chills, cough, or new shortness of breath).    Past Medical History:  Diagnosis Date  . A-fib (HCC)   . Anxiety   . Arthritis   . CHF (congestive  heart failure) (HCC)   . Chronic back pain   . Constipation   . Constipation, chronic   . Depression   . Family history of adverse reaction to anesthesia    Mother, Sister, daughter- N/V  . History of blood transfusion    "pregnancy"  . HTN (hypertension)   . Insomnia   . Migraine headache   . Osteoporosis   . PONV (postoperative nausea and vomiting)    "violent"  . Pulmonary edema 06/2016  . Sodium (Na) deficiency   . Trigeminal neuralgia    Past Surgical History:  Procedure Laterality Date  . ANKLE FRACTURE SURGERY Right   . CARDIAC CATHETERIZATION     denies  . CATARACT EXTRACTION W/PHACO Right 07/22/2012   Procedure: CATARACT EXTRACTION PHACO AND INTRAOCULAR LENS PLACEMENT (IOC);  Surgeon: Gemma Payor, MD;  Location: [facility];  Service: Ophthalmology;  Laterality: Right;  CDE:  18.93  . CATARACT EXTRACTION W/PHACO Left 08/09/2012   Procedure: CATARACT EXTRACTION PHACO AND INTRAOCULAR LENS PLACEMENT (IOC);  Surgeon: Gemma Payor, MD;  Location: [facility];  Service: Ophthalmology;  Laterality: Left;  CDE: 17.21  . COLONOSCOPY    . HARDWARE REMOVAL  07/18/2011   Procedure: HARDWARE REMOVAL; Right leg-  Surgeon: Vickki Hearing, MD;  Location: [facility];  Service: Orthopedics;  Laterality: Right;  . KYPHOPLASTY N/A 12/26/2016   Procedure: Lumbar two Lumbar three and Lumbar five KYPHOPLASTY;  Surgeon: Maeola Harman, MD;  Location: [facility]  OR;  Service: Neurosurgery;  Laterality: N/A;  . KYPHOPLASTY N/A 02/19/2017   Procedure: KYPHOPLASTY LUMBAR ONE;  Surgeon: Maeola HarmanStern, Joseph, MD;  [facility];  Service: Neurosurgery;  Laterality: N/A;  KYPHOPLASTY LUMBAR ONE  . NERVE REPAIR  07/18/2011   Procedure: NERVE REPAIR;  Surgeon: Vickki HearingStanley E Harrison, MD;  Location: [facility];  Service: Orthopedics;  Laterality: Right;  Right leg superficial peroneal nerve release    . PARTIAL HYSTERECTOMY       Current Meds  Medication Sig  . ANALPRAM-HC 1-1 % rectal cream APPLY TO AFFECTED AREAS TWICE DAILY.  .  Carboxymethylcellul-Glycerin (LUBRICATING EYE DROPS OP) Place 1 drop into both eyes 3 (three) times daily.   . clobetasol (TEMOVATE) 0.05 % external solution APPLY TO AFFECTED AREAS TWICE DAILY. (Patient taking differently: as needed. APPLY TO AFFECTED AREAS TWICE DAILY.)  . furosemide (LASIX) 20 MG tablet Take 20 mg by mouth as needed.  . hydrALAZINE (APRESOLINE) 50 MG tablet Take 1 tablet (50 mg total) by mouth 4 (four) times daily.  Marland Kitchen. HYDROmorphone (DILAUDID) 2 MG tablet Take 2 mg by mouth daily as needed for moderate pain.   Marland Kitchen. lidocaine (XYLOCAINE) 5 % ointment Apply 1 application topically 3 (three) times daily as needed.  Marland Kitchen. LORazepam (ATIVAN) 0.5 MG tablet One up to BID  . losartan (COZAAR) 50 MG tablet Take 50 mg  two times Daily at 7 AM and at 7 PM  . metoprolol succinate (TOPROL XL) 50 MG 24 hr tablet Take 1 tablet (50 mg total) by mouth daily. Take with or immediately following a meal.  . metoprolol tartrate (LOPRESSOR) 25 MG tablet Take 25 mg by mouth 2 (two) times daily as needed.  . ondansetron (ZOFRAN) 4 MG tablet Take 1 tablet (4 mg total) by mouth every 8 (eight) hours as needed for nausea or vomiting.  . pantoprazole (PROTONIX) 40 MG tablet TAKE 1 TABLET BY MOUTH ONCE A DAY. (Patient taking differently: Take 40 mg by mouth daily as needed (for acid reflux). )  . polyethylene glycol powder (MIRALAX) powder Take 17 g daily as needed by mouth for moderate constipation.   . potassium chloride (K-DUR) 10 MEQ tablet Take 1 tablet by mouth daily as needed. When taking lasix  . zolpidem (AMBIEN) 10 MG tablet TAKE 1 TABLET BY MOUTH AT BEDTIME FOR SLEEP.  . [DISCONTINUED] potassium chloride (K-DUR) 10 MEQ tablet Take 1 tablet (10 mEq total) by mouth every other day. (Patient taking differently: Take 10 mEq by mouth every other day. Take an extra 10 meq on the days that you take a Lasix)  . [DISCONTINUED] rivaroxaban (XARELTO) 20 MG TABS tablet TAKE 1 TABLET DAILY WITH SUPPER. (Patient taking  differently: Take 20 mg by mouth every evening. Marland Kitchen.)     Allergies:   Amlodipine; Codeine; Other; and Tramadol   Social History   Tobacco Use  . Smoking status: Never Smoker  . Smokeless tobacco: Never Used  Substance Use Topics  . Alcohol use: No    Alcohol/week: 0.0 standard drinks  . Drug use: No     Family Hx: The patient's family history includes Alzheimer's disease in her mother; Cancer in her mother; Diabetes in her brother and daughter; Hypertension in her father and mother; Other in her brother and father. There is no history of Anesthesia problems, Hypotension, Malignant hyperthermia, or Pseudochol deficiency.  ROS:   Please see the history of present illness.     All other systems reviewed and are negative.  Prior CV studies:   The following studies were reviewed today:  NA  Labs/Other Tests and Data Reviewed:    EKG:  No ECG reviewed.  Recent Labs: 10/14/2017: TSH 2.490 10/31/2017: Magnesium 2.3 12/16/2017: ALT 12; B Natriuretic Peptide 337.0 04/11/2018: BUN 15; Creatinine, Ser 0.77; Hemoglobin 11.8; Platelets 293; Potassium 3.4; Sodium 139   Recent Lipid Panel Lab Results  Component Value Date/Time   CHOL 177 09/24/2015 08:37 AM   TRIG 133 09/24/2015 08:37 AM   HDL 57 09/24/2015 08:37 AM   CHOLHDL 3.1 09/24/2015 08:37 AM   CHOLHDL 3.1 11/07/2013 08:07 AM   LDLCALC 93 09/24/2015 08:37 AM    Wt Readings from Last 3 Encounters:  06/07/18 133 lb (60.3 kg)  04/14/18 132 lb 12.8 oz (60.2 kg)  04/11/18 134 lb (60.8 kg)     Objective:    Vital Signs:  BP (!) 144/87   Pulse 88   Ht  (1.6 m)   BMI 23.56 kg/m    VITAL SIGNS:  reviewed  ASSESSMENT & PLAN:    1.  Paroxysmal atrial fibrillation: Symptomatically stable. Currently on Toprol-XL 50 mg daily.  She had issues with bradycardia with higher doses in the past.  She has deferred antiarrhythmic therapy.  Continue Xarelto for anticoagulation.  She takes 25 mg of metoprolol tartrate for heart  rates which are consistently greater than 120 bpm.  2.  Hypertension: She has no evidence of renal artery stenosis by renal Dopplers.  She tends to take hydralazine 50 mg 4 times per day.  Now taking losartan 50 mg twice daily along with Toprol-XL 50 mg daily.  Intolerant to amlodipine in the past.  3.  Chronic diastolic heart failure: Euvolemic.  Diuretic therapy has led to hyponatremia in the past. Continue conservative measures including sodium and fluid restriction. She takes Lasix as needed. Labs reviewed above.  4.  Hypokalemia: I have instructed her to take potassium chloride 10 mEq every other day. K 3.9 on 06/11/18.  5. Loss of appetite: I have encouraged her to speak with her PCP about getting something to stimulate her appetite.   COVID-19 Education: The signs and symptoms of COVID-19 were discussed with the patient and how to seek care for testing (follow up with PCP or arrange E-visit).  The importance of social distancing was discussed today.  Time:   Today, I have spent 25 minutes with the patient with telehealth technology discussing the above problems.     Medication Adjustments/Labs and Tests Ordered: Current medicines are reviewed at length with the patient today.  Concerns regarding medicines are outlined above.   Tests Ordered: No orders of the defined types were placed in this encounter.   Medication Changes: No orders of the defined types were placed in this encounter.   Disposition:  Follow up in 3 month(s)  Signed, Prentice Docker, MD  06/22/2018 2:13 PM    [facility]

## 2018-06-22 NOTE — Patient Instructions (Signed)
Medication Instructions:   Your physician recommends that you continue on your current medications as directed. Please refer to the Current Medication list given to you today.  Labwork:  NONE  Testing/Procedures:  NONE  Follow-Up:  Your physician recommends that you schedule a follow-up appointment in: 3 months.  Any Other Special Instructions Will Be Listed Below (If Applicable).  If you need a refill on your cardiac medications before your next appointment, please call your pharmacy. 

## 2018-06-23 ENCOUNTER — Telehealth: Payer: Self-pay | Admitting: Family Medicine

## 2018-06-23 NOTE — Telephone Encounter (Signed)
Discussed with pt. Pt verbalized understanding.  °

## 2018-06-23 NOTE — Telephone Encounter (Signed)
Please advise. Thank you

## 2018-06-23 NOTE — Telephone Encounter (Signed)
We have only one prescriptiopn medicine that s truly effective for appetite, but it has many side effects, and is reserved for cancer patient ad gnerally costly  No easy answers here  May consider starting a nurtritional supplement ith a daily foost o carnation inst bfast or ensure  And take a daiy multi vit like centrum silver  Monitor weight loss if substntil will need to consider changing b p meds

## 2018-06-23 NOTE — Telephone Encounter (Signed)
Patient seen her heart doctor and doing well but she stated to him she has no appetite due to her blood pressure medication which is working for her. She is wanting something to help with appetite. [facility] pharmacy

## 2018-07-01 ENCOUNTER — Other Ambulatory Visit: Payer: Self-pay

## 2018-07-01 ENCOUNTER — Ambulatory Visit (INDEPENDENT_AMBULATORY_CARE_PROVIDER_SITE_OTHER): Payer: Medicare Other | Admitting: Family Medicine

## 2018-07-01 ENCOUNTER — Encounter: Payer: Self-pay | Admitting: Family Medicine

## 2018-07-01 VITALS — Temp 98.1°F

## 2018-07-01 DIAGNOSIS — H00014 Hordeolum externum left upper eyelid: Secondary | ICD-10-CM

## 2018-07-01 MED ORDER — GENTAMICIN SULFATE 0.3 % OP SOLN: OPHTHALMIC | 0 refills | Status: DC

## 2018-07-01 NOTE — Progress Notes (Signed)
   Subjective:    Patient ID: Katherine Lane, female    DOB: 01-06-1933, 85 y.o.   MRN: [id number]  HPI Patient arrives with a swollen eyelid for 3 weeks. Patient states the lid is irritated and itches and seems to have a bump on it.   Review of Systems     Objective:   Physical Exam        Assessment & Plan:

## 2018-07-01 NOTE — Progress Notes (Signed)
Patient arrives with an irritated eye.  Several days duration.  Upper eyelid.  Patient was very worried about the possibility of shingles.  Had a relatively developed shingles and lost her vision in affected eye.  Claims up-to-date on shingles vaccine.  Vital signs stable blood pressure on assessment is 134/80.  Alert vitals stable, NAD. Blood pressure good on repeat. HEENT normal. Lungs clear. Heart regular rate and rhythm.  Left upper eyelid.  Pointing stye evident.  Ocular exam normal  Impression 1 hypertension good control discussed  2.  Left eye.  Stye.  Local measures discussed.  Antibiotic eyedrops discussed.  Concerns regarding shingles discussed  Greater than 50% of this 15 minute face to face visit was spent in counseling and discussion and coordination of care regarding the above diagnosis/diagnosies

## 2018-07-26 ENCOUNTER — Ambulatory Visit: Payer: Medicare Other | Admitting: Cardiovascular Disease

## 2018-08-16 ENCOUNTER — Other Ambulatory Visit: Payer: Self-pay | Admitting: Cardiovascular Disease

## 2018-09-06 ENCOUNTER — Other Ambulatory Visit: Payer: Self-pay | Admitting: Family Medicine

## 2018-10-18 ENCOUNTER — Encounter: Payer: Self-pay | Admitting: Cardiovascular Disease

## 2018-10-18 ENCOUNTER — Ambulatory Visit: Payer: Medicare Other | Admitting: Cardiovascular Disease

## 2018-10-18 VITALS — BP 157/88 | HR 74 | Temp 96.9°F | Ht 63.0 in | Wt 129.0 lb

## 2018-10-18 DIAGNOSIS — I4819 Other persistent atrial fibrillation: Secondary | ICD-10-CM | POA: Diagnosis not present

## 2018-10-18 DIAGNOSIS — I10 Essential (primary) hypertension: Secondary | ICD-10-CM | POA: Diagnosis not present

## 2018-10-18 DIAGNOSIS — I5032 Chronic diastolic (congestive) heart failure: Secondary | ICD-10-CM

## 2018-10-18 DIAGNOSIS — Z7901 Long term (current) use of anticoagulants: Secondary | ICD-10-CM | POA: Diagnosis not present

## 2018-10-18 MED ORDER — METOPROLOL SUCCINATE ER 50 MG PO TB24: ORAL_TABLET | ORAL | 11 refills | Status: DC

## 2018-10-18 NOTE — Progress Notes (Signed)
SUBJECTIVE: Katherine Lane presents for follow-up of paroxysmal atrial fibrillation and accelerated hypertension.  She has a history of chronic anxiety.  She is here with her daughter, Zella BallRobin.  She has been having palpitations and takes as needed Lopressor almost every other day.  She thinks her appetite is diminished since starting hydralazine but she has been force-feeding and Zella BallRobin says she has been forcing her to eat as well.  She does have some loose stools about 2 hours after taking hydralazine but this appears to be the only medication which has helped control her blood pressure.  She denies exertional chest pain.  She does have some exertional dyspnea.  She denies leg swelling, orthopnea, and paroxysmal nocturnal dyspnea.    Review of Systems: As per "subjective", otherwise negative.  Allergies  Allergen Reactions  . Amlodipine Swelling    Leg swelling   . Codeine Nausea And Vomiting and Other (See Comments)    Patient states "intolerance to all pain medications"  (NO OPIOIDS) VERY VIOLENT VOMITING!!  . Other Nausea And Vomiting and Other (See Comments)    general anesthesia  . Tramadol Nausea And Vomiting and Other (See Comments)    "NO OPIOIDS!!! VERY VIOLENT VOMITING!!" per Med History prior to 02/18/17    Current Outpatient Medications  Medication Sig Dispense Refill  . ANALPRAM-HC 1-1 % rectal cream APPLY TO AFFECTED AREAS TWICE DAILY. 28.4 g prn  . Carboxymethylcellul-Glycerin (LUBRICATING EYE DROPS OP) Place 1 drop into both eyes 3 (three) times daily.     . clobetasol (TEMOVATE) 0.05 % external solution APPLY TO AFFECTED AREAS TWICE DAILY. (Patient taking differently: as needed. APPLY TO AFFECTED AREAS TWICE DAILY.) 50 mL 3  . furosemide (LASIX) 20 MG tablet Take 20 mg by mouth as needed.    Marland Kitchen. gentamicin (GARAMYCIN) 0.3 % ophthalmic solution 2 drops affected eye QID 5 mL 0  . hydrALAZINE (APRESOLINE) 50 MG tablet TAKE (1) TABLET BY MOUTH (4) TIMES DAILY. 120 tablet  6  . hydrocortisone (ANUSOL-HC) 2.5 % rectal cream APPLY RECTALLY THREE TIMES DAILY AS DIRECTED 30 g 0  . HYDROmorphone (DILAUDID) 2 MG tablet Take 2 mg by mouth daily as needed for moderate pain.     Marland Kitchen. lidocaine (XYLOCAINE) 5 % ointment Apply 1 application topically 3 (three) times daily as needed. 35.44 g 1  . LORazepam (ATIVAN) 0.5 MG tablet One up to BID 60 tablet 5  . losartan (COZAAR) 50 MG tablet Take 50 mg  two times Daily at 7 AM and at 7 PM 90 tablet 3  . metoprolol succinate (TOPROL XL) 50 MG 24 hr tablet Take 1 tablet (50 mg total) by mouth daily. Take with or immediately following a meal. 30 tablet 11  . metoprolol tartrate (LOPRESSOR) 25 MG tablet Take 25 mg by mouth 2 (two) times daily as needed.    . ondansetron (ZOFRAN) 4 MG tablet Take 1 tablet (4 mg total) by mouth every 8 (eight) hours as needed for nausea or vomiting. 30 tablet 2  . pantoprazole (PROTONIX) 40 MG tablet TAKE 1 TABLET BY MOUTH ONCE A DAY. (Patient taking differently: Take 40 mg by mouth daily as needed (for acid reflux). ) 30 tablet 0  . polyethylene glycol powder (MIRALAX) powder Take 17 g daily as needed by mouth for moderate constipation.     . potassium chloride (K-DUR) 10 MEQ tablet Take 1 tablet by mouth daily as needed. When taking lasix    . XARELTO 20 MG TABS  tablet TAKE 1 TABLET DAILY WITH SUPPER. 90 tablet 3  . zolpidem (AMBIEN) 10 MG tablet TAKE 1 TABLET BY MOUTH AT BEDTIME FOR SLEEP. 30 tablet 5   No current facility-administered medications for this visit.     Past Medical History:  Diagnosis Date  . A-fib ([place])   . Anxiety   . Arthritis   . CHF (congestive heart failure) ([place])   . Chronic back pain   . Constipation   . Constipation, chronic   . Depression   . Family history of adverse reaction to anesthesia    Mother, Sister, daughter- N/V  . History of blood transfusion    "pregnancy"  . HTN (hypertension)   . Insomnia   . Migraine headache   . Osteoporosis   . PONV  (postoperative nausea and vomiting)    "violent"  . Pulmonary edema 06/2016  . Sodium (Na) deficiency   . Trigeminal neuralgia     Past Surgical History:  Procedure Laterality Date  . ANKLE FRACTURE SURGERY Right   . CARDIAC CATHETERIZATION     denies  . CATARACT EXTRACTION W/PHACO Right 07/22/2012   Procedure: CATARACT EXTRACTION PHACO AND INTRAOCULAR LENS PLACEMENT (IOC);  Surgeon: Tonny Branch, MD;  Location: [facility];  Service: Ophthalmology;  Laterality: Right;  CDE:  18.93  . CATARACT EXTRACTION W/PHACO Left 08/09/2012   Procedure: CATARACT EXTRACTION PHACO AND INTRAOCULAR LENS PLACEMENT (IOC);  Surgeon: Tonny Branch, MD;  Location: [facility];  Service: Ophthalmology;  Laterality: Left;  CDE: 17.21  . COLONOSCOPY    . HARDWARE REMOVAL  07/18/2011   Procedure: HARDWARE REMOVAL; Right leg-  Surgeon: Carole Civil, MD;  Location: [facility];  Service: Orthopedics;  Laterality: Right;  . KYPHOPLASTY N/A 12/26/2016   Procedure: Lumbar two Lumbar three and Lumbar five KYPHOPLASTY;  Surgeon: Erline Levine, MD;  Location: [place];  Service: Neurosurgery;  Laterality: N/A;  . KYPHOPLASTY N/A 02/19/2017   Procedure: KYPHOPLASTY LUMBAR ONE;  Surgeon: Erline Levine, MD;  Location: [place];  Service: Neurosurgery;  Laterality: N/A;  KYPHOPLASTY LUMBAR ONE  . NERVE REPAIR  07/18/2011   Procedure: NERVE REPAIR;  Surgeon: Carole Civil, MD;  Location: [facility];  Service: Orthopedics;  Laterality: Right;  Right leg superficial peroneal nerve release    . PARTIAL HYSTERECTOMY      Social History   Socioeconomic History  . Marital status: Widowed    Spouse name: Not on file  . Number of children: Not on file  . Years of education: Not on file  . Highest education level: Not on file  Occupational History  . Occupation: retired    Fish farm manager: RETIRED  Social Needs  . Financial resource strain: Not on file  . Food insecurity    Worry: Not on file    Inability: Not on file  . Transportation needs     Medical: Not on file    Non-medical: Not on file  Tobacco Use  . Smoking status: Never Smoker  . Smokeless tobacco: Never Used  Substance and Sexual Activity  . Alcohol use: No    Alcohol/week: 0.0 standard drinks  . Drug use: No  . Sexual activity: Not Currently    Birth control/protection: Surgical    Comment: hyst  Lifestyle  . Physical activity    Days per week: Not on file    Minutes per session: Not on file  . Stress: Not on file  Relationships  . Social connections    Talks on phone: Not on  file    Gets together: Not on file    Attends religious service: Not on file    Active member of club or organization: Not on file    Attends meetings of clubs or organizations: Not on file    Relationship status: Not on file  . Intimate partner violence    Fear of current or ex partner: Not on file    Emotionally abused: Not on file    Physically abused: Not on file    Forced sexual activity: Not on file  Other Topics Concern  . Not on file  Social History Narrative  . Not on file     Vitals:   10/18/18 1119  BP: (!) 157/88  Pulse: 74  Temp: (!) 96.9 F (36.1 C)  SpO2: 98%  Height: 5\' 3"  (1.6 m)    Wt Readings from Last 3 Encounters:  06/07/18 133 lb (60.3 kg)  04/14/18 132 lb 12.8 oz (60.2 kg)  04/11/18 134 lb (60.8 kg)     PHYSICAL EXAM General: NAD HEENT: Normal. Neck: No JVD, no thyromegaly. Lungs: Clear to auscultation bilaterally with normal respiratory effort. CV: Regular rate and irregular rhythm, normal S1/S2, no S3, no murmur. No pretibial or periankle edema.  No carotid bruit.   Abdomen: Soft, nontender, no distention.  Neurologic: Alert and oriented.  Psych: Normal affect. Skin: Normal. Musculoskeletal: No gross deformities.    ECG: Reviewed above under Subjective   Labs: Lab Results  Component Value Date/Time   K 3.4 (L) 04/11/2018 02:04 AM   BUN 15 04/11/2018 02:04 AM   BUN 13 10/14/2017 12:50 PM   CREATININE 0.77 04/11/2018 02:04  AM   CREATININE 0.90 10/18/2014 10:38 AM   ALT 12 12/16/2017 12:09 AM   TSH 2.490 10/14/2017 12:50 PM   HGB 11.8 (L) 04/11/2018 02:04 AM   HGB 14.1 10/14/2017 12:50 PM     Lipids: Lab Results  Component Value Date/Time   LDLCALC 93 09/24/2015 08:37 AM   CHOL 177 09/24/2015 08:37 AM   TRIG 133 09/24/2015 08:37 AM   HDL 57 09/24/2015 08:37 AM       ASSESSMENT AND PLAN:  1. Persistent atrial fibrillation:  She does have some exertional dyspnea and has been taking as needed Lopressor almost every other day. Currently on Toprol-XL 50 mg daily. She had issues with bradycardia with higher doses in the past.  I will cautiously try increasing Toprol-XL to 75 mg daily. She has deferred antiarrhythmic therapy. Continue Xarelto for anticoagulation. She takes 25 mg of metoprolol tartrate for heart rates which are consistently greater than 120 bpm.  2. Accelerated hypertension: She has no evidence of renal artery stenosis by renal Dopplers. She tends to take hydralazine 50 mg 4 times per day and takes losartan 50 mg twice daily along with Toprol-XL 50 mg daily. Intolerant to amlodipine in the past.  I will monitor given increase of Toprol-XL.  3. Chronic diastolic heart failure: Euvolemic. Diuretic therapy has led to hyponatremia in the past. Continue conservative measures including sodium and fluid restriction. She takes Lasix as needed.    Disposition: Follow up 6 months   Prentice Docker, M.D., F.A.C.C.

## 2018-10-18 NOTE — Patient Instructions (Addendum)
Medication Instructions:INCREASE Toprol to 75 mg daily  Labwork:None   Procedures/Testing: None  Follow-Up:6 months with Dr.koneswaran   Any Additional Special Instructions Will Be Listed Below (If Applicable).     If you need a refill on your cardiac medications before your next appointment, please call your pharmacy.      Thank you for choosing [place] !

## 2018-10-19 ENCOUNTER — Other Ambulatory Visit: Payer: Self-pay | Admitting: Family Medicine

## 2018-10-19 NOTE — Telephone Encounter (Signed)
Ok six ref 

## 2018-11-03 ENCOUNTER — Encounter

## 2018-11-03 ENCOUNTER — Encounter: Payer: Self-pay | Admitting: Gastroenterology

## 2018-11-03 ENCOUNTER — Ambulatory Visit: Payer: Medicare Other | Admitting: Gastroenterology

## 2018-11-03 ENCOUNTER — Other Ambulatory Visit: Payer: Self-pay

## 2018-11-03 DIAGNOSIS — K648 Other hemorrhoids: Secondary | ICD-10-CM

## 2018-11-03 DIAGNOSIS — K6289 Other specified diseases of anus and rectum: Secondary | ICD-10-CM

## 2018-11-03 DIAGNOSIS — K625 Hemorrhage of anus and rectum: Secondary | ICD-10-CM

## 2018-11-03 NOTE — Patient Instructions (Signed)
DRINK WATER TO KEEP YOUR URINE LIGHT YELLOW.  FOLLOW A HIGH FIBER DIET. AVOID ITEMS THAT CAUSE BLOATING & GAS. SEE INFO BELOW.  Complete flexible sigmoidoscopy WITH HEMORRHOID BANDING IN THE NEAR FUTURE.   CONTINUE HEMORRHOID CREAM/SUPPOSITORIES TO CONTROL SYMPTOMS.  FOLLOW UP IN 6 MOS.   High-Fiber Diet A high-fiber diet changes your normal diet to include more whole grains, legumes, fruits, and vegetables. Changes in the diet involve replacing refined carbohydrates with unrefined foods. The calorie level of the diet is essentially unchanged. The Dietary Reference Intake (recommended amount) for adult males is 38 grams per day. For adult females, it is 25 grams per day. Pregnant and lactating women should consume 28 grams of fiber per day. Fiber is the intact part of a plant that is not broken down during digestion. Functional fiber is fiber that has been isolated from the plant to provide a beneficial effect in the body.  PURPOSE  Increase stool bulk.   Ease and regulate bowel movements.   Lower cholesterol.   REDUCE RISK OF COLON CANCER  INDICATIONS THAT YOU NEED MORE FIBER  Constipation and hemorrhoids.   Uncomplicated diverticulosis (intestine condition) and irritable bowel syndrome.   Weight management.   As a protective measure against hardening of the arteries (atherosclerosis), diabetes, and cancer.   GUIDELINES FOR INCREASING FIBER IN THE DIET  Start adding fiber to the diet slowly. A gradual increase of about 5 more grams (2 servings of most fruits or vegetables) per day is best. Too rapid an increase in fiber may result in constipation, flatulence, and bloating.   Drink enough water and fluids to keep your urine clear or pale yellow. Water, juice, or caffeine-free drinks are recommended. Not drinking enough fluid may cause constipation.   Eat a variety of high-fiber foods rather than one type of fiber.   Try to increase your intake of fiber through using  high-fiber foods rather than fiber pills or supplements that contain small amounts of fiber.   The goal is to change the types of food eaten. Do not supplement your present diet with high-fiber foods, but replace foods in your present diet.

## 2018-11-03 NOTE — Assessment & Plan Note (Signed)
SYMPTOMS NOT IDEALLY CONTROLLED AND PT FAILED MEDICAL MANAGEMENT. NOT INTERESTED IN SURGICAL MANAGEMENT AT THIS TIME.  DRINK WATER TO KEEP YOUR URINE LIGHT YELLOW. FOLLOW A HIGH FIBER DIET. AVOID ITEMS THAT CAUSE BLOATING & GAS.  HANDOUT GIVEN. Complete flexible sigmoidoscopy WITH HEMORRHOID BANDING IN THE NEAR FUTURE. DISCUSSED PROCEDURE, BENEFITS, & RISKS: < 1% chance of medication reaction, bleeding, perforation, PELVIC VEIN SEPSIS, ASPIRATION, or rupture of spleen/liver. NEEDS ZOFRAN 4 MG IV IN PREOP. CONTINUE HEMORRHOID CREAM/SUPPOSITORIES TO CONTROL SYMPTOMS. FOLLOW UP IN 6 MOS.

## 2018-11-03 NOTE — Progress Notes (Signed)
 Subjective:    Patient ID: Katherine Lane, female    DOB: 11/23/1932, 85 y.o.   MRN: [id number]  Luking, William S, MD   HPI STILL HAVING PROBLEMS WITH HEMORRHOIDS. CAN'T SIT. BLEEDING: 1X/MO AND FEELS BETTER AFTERWARDS. BMs: MAGNESIUM PILL AND MOVE REAL GOOD A VERY DAY.  HAS BEEN USING HYDROCORTISONE AND PREPARATION H. HEMORRHOIDS BOTHER HER ALL THE TIME. WHEN SHE LAYS DOWN THEY ARE BETTER BUT WHEN UP AND MOVING AROUND IT'S MISERABLE. HAS KNOWN HISTORY OF RECTAL PROLAPSE. SOB: AT TIMES IT GETS WORSE AND AT TIMES IT'S BETTER. ALSO HAVING A LOT OF SINUS DRAINAGE. DOESN'T TAKE DILAUDID DUE TO SIDE EFFECTS. LEGS HURT A LOT. HAS PROBLEMS WITH GAS IN UPPER ABDOMEN. SEDATION MAKES HER SICK. ZOFRAN BEEN EFFECTIVE FOR NAUSEA   PT DENIES FEVER, CHILLS, HEMATOCHEZIA, HEMATEMESIS, nausea, vomiting, melena, diarrhea, CHEST PAIN, SHORTNESS OF BREATH, CHANGE IN BOWEL IN HABITS, constipation, abdominal pain, problems swallowing, OR heartburn or indigestion.  Past Medical History:  Diagnosis Date  . A-fib (HCC)   . Anxiety   . Arthritis   . CHF (congestive heart failure) (HCC)   . Chronic back pain   . Constipation   . Constipation, chronic   . Depression   . Family history of adverse reaction to anesthesia    Mother, Sister, daughter- N/V  . History of blood transfusion    "pregnancy"  . HTN (hypertension)   . Insomnia   . Migraine headache   . Osteoporosis   . PONV (postoperative nausea and vomiting)    "violent"  . Pulmonary edema 06/2016  . Sodium (Na) deficiency   . Trigeminal neuralgia    Past Surgical History:  Procedure Laterality Date  . ANKLE FRACTURE SURGERY Right   . CARDIAC CATHETERIZATION     denies  . CATARACT EXTRACTION W/PHACO Right 07/22/2012   Procedure: CATARACT EXTRACTION PHACO AND INTRAOCULAR LENS PLACEMENT (IOC);  Surgeon: Kerry Hunt, MD;  Location: [facility];  Service: Ophthalmology;  Laterality: Right;  CDE:  18.93  . CATARACT EXTRACTION W/PHACO Left 08/09/2012   Procedure: CATARACT EXTRACTION PHACO AND INTRAOCULAR LENS PLACEMENT (IOC);  Surgeon: Kerry Hunt, MD;  Location: [facility];  Service: Ophthalmology;  Laterality: Left;  CDE: 17.21  . COLONOSCOPY    . HARDWARE REMOVAL  07/18/2011   Procedure: HARDWARE REMOVAL; Right leg-  Surgeon: Stanley E Harrison, MD;  Location: [facility];  Service: Orthopedics;  Laterality: Right;  . KYPHOPLASTY N/A 12/26/2016   Procedure: Lumbar two Lumbar three and Lumbar five KYPHOPLASTY;  Surgeon: Stern, Joseph, MD;  Location: [facility];  Service: Neurosurgery;  Laterality: N/A;  . KYPHOPLASTY N/A 02/19/2017   Procedure: KYPHOPLASTY LUMBAR ONE;  Surgeon: Stern, Joseph, MD;  Location: [facility];  Service: Neurosurgery;  Laterality: N/A;  KYPHOPLASTY LUMBAR ONE  . NERVE REPAIR  07/18/2011   Procedure: NERVE REPAIR;  Surgeon: Stanley E Harrison, MD;  Location: [facility];  Service: Orthopedics;  Laterality: Right;  Right leg superficial peroneal nerve release    . PARTIAL HYSTERECTOMY     Allergies  Allergen Reactions  . Amlodipine Swelling    Leg swelling   . Codeine Nausea And Vomiting and Other (See Comments)    Patient states "intolerance to all pain medications"  (NO OPIOIDS) VERY VIOLENT VOMITING!!  . Other Nausea And Vomiting and Other (See Comments)    general anesthesia  . Tramadol Nausea And Vomiting and Other (See Comments)    "NO OPIOIDS!!! VERY VIOLENT VOMITING!!" per Med History prior to 02/18/17      Current Outpatient Medications  Medication Sig    . ANALPRAM-HC 1-1 % rectal cream APPLY TO AFFECTED AREAS TWICE DAILY.    . Carboxymethylcellul-Glycerin (LUBRICATING EYE DROPS OP) Place 1 drop into both eyes 3 (three) times daily.     . clobetasol (TEMOVATE) 0.05 % external solution APPLY TO AFFECTED AREAS TWICE DAILY. (Patient taking differently: as needed. APPLY TO AFFECTED AREAS TWICE DAILY.)    . furosemide (LASIX) 20 MG tablet Take 20 mg by mouth as needed.    . hydrALAZINE (APRESOLINE) 50 MG tablet TAKE (1) TABLET BY MOUTH  (4) TIMES DAILY.    . hydrocortisone (ANUSOL-HC) 2.5 % rectal cream APPLY RECTALLY THREE TIMES DAILY AS DIRECTED    . lidocaine (XYLOCAINE) 5 % ointment Apply 1 application topically 3 (three) times daily as needed.    Marland Kitchen. LORazepam (ATIVAN) 0.5 MG tablet One up to BID    . losartan (COZAAR) 50 MG tablet Take 50 mg  two times Daily at 7 AM and at 7 PM    . Magnesium 250 MG TABS Take 1 tablet by mouth daily.    . metoprolol succinate (TOPROL XL) 50 MG 24 hr tablet Take 75 mg (1 1/2 tablets daily) Take with or immediately following a meal.    . metoprolol tartrate (LOPRESSOR) 25 MG tablet Take 25 mg by mouth 2 (two) times daily as needed.    . Multiple Vitamins-Minerals (CENTRUM SILVER PO) Take 1 tablet by mouth daily.    . Multiple Vitamins-Minerals (PRESERVISION/LUTEIN PO) Take 1 tablet by mouth 2 (two) times daily.    . ondansetron (ZOFRAN) 4 MG tablet Take 1 tablet (4 mg total) by mouth every 8 (eight) hours as needed for nausea or vomiting.    . polyethylene glycol powder (MIRALAX) powder Take 17 g daily as needed by mouth for moderate constipation.     . potassium chloride (K-DUR) 10 MEQ tablet Take 1 tablet by mouth every other day. When taking lasix    . shark liver oil-cocoa butter (PREPARATION H) 0.25-3-85.5 % suppository Place 1 suppository rectally 2 (two) times daily.    Carlena Hurl. XARELTO 20 MG TABS tablet TAKE 1 TABLET DAILY WITH SUPPER.    Marland Kitchen. zolpidem (AMBIEN) 10 MG tablet TAKE 1 TABLET BY MOUTH AT BEDTIME FOR SLEEP.    .      . HYDROmorphone (DILAUDID) 2 MG tablet Take 2 mg by mouth daily as needed for moderate pain.  DOESN'T TAKE IT   .         Review of Systems     Objective:   Physical Exam Vitals signs reviewed.  Constitutional:      General: She is not in acute distress.    Appearance: She is well-developed.  HENT:     Head: Normocephalic and atraumatic.     Mouth/Throat:     Pharynx: No oropharyngeal exudate.  Eyes:     General: No scleral icterus.    Pupils: Pupils are  equal, round, and reactive to light.  Neck:     Musculoskeletal: Normal range of motion and neck supple.  Cardiovascular:     Rate and Rhythm: Normal rate. Rhythm irregular.     Heart sounds: Normal heart sounds.  Pulmonary:     Effort: Pulmonary effort is normal. No respiratory distress.     Breath sounds: Normal breath sounds.  Abdominal:     General: Bowel sounds are normal. There is no distension.     Palpations: Abdomen is soft.  Tenderness: There is no abdominal tenderness.  Genitourinary:    Rectum: External hemorrhoid and internal hemorrhoid (GRADE 3) present. No mass or tenderness. Normal anal tone.     Comments: UNABLE TO FEEL RECTAL PROLAPSE WITH VALSALVA BEFORE OR DURING DIGITAL EXAM. Lymphadenopathy:     Cervical: No cervical adenopathy.  Neurological:     Mental Status: She is alert and oriented to person, place, and time.           Assessment & Plan:

## 2018-11-03 NOTE — H&P (View-Only) (Signed)
Subjective:    Patient ID: Katherine Lane, female    DOB: [birth year], 85 y.o.   MRN: [id number]  Merlyn Albert, MD   HPI STILL HAVING PROBLEMS WITH HEMORRHOIDS. CAN'T SIT. BLEEDING: 1X/MO AND FEELS BETTER AFTERWARDS. BMs: MAGNESIUM PILL AND MOVE REAL GOOD A VERY DAY.  HAS BEEN USING HYDROCORTISONE AND PREPARATION H. HEMORRHOIDS BOTHER HER ALL THE TIME. WHEN SHE LAYS DOWN THEY ARE BETTER BUT WHEN UP AND MOVING AROUND IT'S MISERABLE. HAS KNOWN HISTORY OF RECTAL PROLAPSE. SOB: AT TIMES IT GETS WORSE AND AT TIMES IT'S BETTER. ALSO HAVING A LOT OF SINUS DRAINAGE. DOESN'T TAKE DILAUDID DUE TO SIDE EFFECTS. LEGS HURT A LOT. HAS PROBLEMS WITH GAS IN UPPER ABDOMEN. SEDATION MAKES HER SICK. ZOFRAN BEEN EFFECTIVE FOR NAUSEA   PT DENIES FEVER, CHILLS, HEMATOCHEZIA, HEMATEMESIS, nausea, vomiting, melena, diarrhea, CHEST PAIN, SHORTNESS OF BREATH, CHANGE IN BOWEL IN HABITS, constipation, abdominal pain, problems swallowing, OR heartburn or indigestion.  Past Medical History:  Diagnosis Date  . A-fib (HCC)   . Anxiety   . Arthritis   . CHF (congestive heart failure) (HCC)   . Chronic back pain   . Constipation   . Constipation, chronic   . Depression   . Family history of adverse reaction to anesthesia    Mother, Sister, daughter- N/V  . History of blood transfusion    "pregnancy"  . HTN (hypertension)   . Insomnia   . Migraine headache   . Osteoporosis   . PONV (postoperative nausea and vomiting)    "violent"  . Pulmonary edema 06/2016  . Sodium (Na) deficiency   . Trigeminal neuralgia    Past Surgical History:  Procedure Laterality Date  . ANKLE FRACTURE SURGERY Right   . CARDIAC CATHETERIZATION     denies  . CATARACT EXTRACTION W/PHACO Right 07/22/2012   Procedure: CATARACT EXTRACTION PHACO AND INTRAOCULAR LENS PLACEMENT (IOC);  Surgeon: Gemma Payor, MD;  Location: [facility];  Service: Ophthalmology;  Laterality: Right;  CDE:  18.93  . CATARACT EXTRACTION W/PHACO Left 08/09/2012   Procedure: CATARACT EXTRACTION PHACO AND INTRAOCULAR LENS PLACEMENT (IOC);  Surgeon: Gemma Payor, MD;  Location: [facility];  Service: Ophthalmology;  Laterality: Left;  CDE: 17.21  . COLONOSCOPY    . HARDWARE REMOVAL  07/18/2011   Procedure: HARDWARE REMOVAL; Right leg-  Surgeon: Vickki Hearing, MD;  Location: [facility];  Service: Orthopedics;  Laterality: Right;  . KYPHOPLASTY N/A 12/26/2016   Procedure: Lumbar two Lumbar three and Lumbar five KYPHOPLASTY;  Surgeon: Maeola Harman, MD;  Location: [facility] OR;  Service: Neurosurgery;  Laterality: N/A;  . KYPHOPLASTY N/A 02/19/2017   Procedure: KYPHOPLASTY LUMBAR ONE;  Surgeon: Maeola Harman, MD;  Location: [facility] OR;  Service: Neurosurgery;  Laterality: N/A;  KYPHOPLASTY LUMBAR ONE  . NERVE REPAIR  07/18/2011   Procedure: NERVE REPAIR;  Surgeon: Vickki Hearing, MD;  Location: [facility];  Service: Orthopedics;  Laterality: Right;  Right leg superficial peroneal nerve release    . PARTIAL HYSTERECTOMY     Allergies  Allergen Reactions  . Amlodipine Swelling    Leg swelling   . Codeine Nausea And Vomiting and Other (See Comments)    Patient states "intolerance to all pain medications"  (NO OPIOIDS) VERY VIOLENT VOMITING!!  . Other Nausea And Vomiting and Other (See Comments)    general anesthesia  . Tramadol Nausea And Vomiting and Other (See Comments)    "NO OPIOIDS!!! VERY VIOLENT VOMITING!!" per Med History prior to 02/18/17  Current Outpatient Medications  Medication Sig    . ANALPRAM-HC 1-1 % rectal cream APPLY TO AFFECTED AREAS TWICE DAILY.    . Carboxymethylcellul-Glycerin (LUBRICATING EYE DROPS OP) Place 1 drop into both eyes 3 (three) times daily.     . clobetasol (TEMOVATE) 0.05 % external solution APPLY TO AFFECTED AREAS TWICE DAILY. (Patient taking differently: as needed. APPLY TO AFFECTED AREAS TWICE DAILY.)    . furosemide (LASIX) 20 MG tablet Take 20 mg by mouth as needed.    . hydrALAZINE (APRESOLINE) 50 MG tablet TAKE (1) TABLET BY MOUTH  (4) TIMES DAILY.    . hydrocortisone (ANUSOL-HC) 2.5 % rectal cream APPLY RECTALLY THREE TIMES DAILY AS DIRECTED    . lidocaine (XYLOCAINE) 5 % ointment Apply 1 application topically 3 (three) times daily as needed.    Marland Kitchen. LORazepam (ATIVAN) 0.5 MG tablet One up to BID    . losartan (COZAAR) 50 MG tablet Take 50 mg  two times Daily at 7 AM and at 7 PM    . Magnesium 250 MG TABS Take 1 tablet by mouth daily.    . metoprolol succinate (TOPROL XL) 50 MG 24 hr tablet Take 75 mg (1 1/2 tablets daily) Take with or immediately following a meal.    . metoprolol tartrate (LOPRESSOR) 25 MG tablet Take 25 mg by mouth 2 (two) times daily as needed.    . Multiple Vitamins-Minerals (CENTRUM SILVER PO) Take 1 tablet by mouth daily.    . Multiple Vitamins-Minerals (PRESERVISION/LUTEIN PO) Take 1 tablet by mouth 2 (two) times daily.    . ondansetron (ZOFRAN) 4 MG tablet Take 1 tablet (4 mg total) by mouth every 8 (eight) hours as needed for nausea or vomiting.    . polyethylene glycol powder (MIRALAX) powder Take 17 g daily as needed by mouth for moderate constipation.     . potassium chloride (K-DUR) 10 MEQ tablet Take 1 tablet by mouth every other day. When taking lasix    . shark liver oil-cocoa butter (PREPARATION H) 0.25-3-85.5 % suppository Place 1 suppository rectally 2 (two) times daily.    Carlena Hurl. XARELTO 20 MG TABS tablet TAKE 1 TABLET DAILY WITH SUPPER.    Marland Kitchen. zolpidem (AMBIEN) 10 MG tablet TAKE 1 TABLET BY MOUTH AT BEDTIME FOR SLEEP.    .      . HYDROmorphone (DILAUDID) 2 MG tablet Take 2 mg by mouth daily as needed for moderate pain.  DOESN'T TAKE IT   .         Review of Systems     Objective:   Physical Exam Vitals signs reviewed.  Constitutional:      General: She is not in acute distress.    Appearance: She is well-developed.  HENT:     Head: Normocephalic and atraumatic.     Mouth/Throat:     Pharynx: No oropharyngeal exudate.  Eyes:     General: No scleral icterus.    Pupils: Pupils are  equal, round, and reactive to light.  Neck:     Musculoskeletal: Normal range of motion and neck supple.  Cardiovascular:     Rate and Rhythm: Normal rate. Rhythm irregular.     Heart sounds: Normal heart sounds.  Pulmonary:     Effort: Pulmonary effort is normal. No respiratory distress.     Breath sounds: Normal breath sounds.  Abdominal:     General: Bowel sounds are normal. There is no distension.     Palpations: Abdomen is soft.  Tenderness: There is no abdominal tenderness.  Genitourinary:    Rectum: External hemorrhoid and internal hemorrhoid (GRADE 3) present. No mass or tenderness. Normal anal tone.     Comments: UNABLE TO FEEL RECTAL PROLAPSE WITH VALSALVA BEFORE OR DURING DIGITAL EXAM. Lymphadenopathy:     Cervical: No cervical adenopathy.  Neurological:     Mental Status: She is alert and oriented to person, place, and time.           Assessment & Plan:

## 2018-11-05 ENCOUNTER — Other Ambulatory Visit: Payer: Self-pay | Admitting: Family Medicine

## 2018-11-05 NOTE — Progress Notes (Signed)
cc'ed to pcp °

## 2018-11-08 ENCOUNTER — Ambulatory Visit: Payer: Medicare Other | Admitting: Family Medicine

## 2018-11-08 ENCOUNTER — Encounter: Payer: Self-pay | Admitting: Family Medicine

## 2018-11-08 ENCOUNTER — Other Ambulatory Visit: Payer: Self-pay

## 2018-11-08 VITALS — BP 122/84 | Temp 98.4°F | Ht 63.0 in | Wt 130.8 lb

## 2018-11-08 DIAGNOSIS — Z23 Encounter for immunization: Secondary | ICD-10-CM

## 2018-11-08 DIAGNOSIS — I10 Essential (primary) hypertension: Secondary | ICD-10-CM

## 2018-11-08 DIAGNOSIS — R82998 Other abnormal findings in urine: Secondary | ICD-10-CM | POA: Diagnosis not present

## 2018-11-08 DIAGNOSIS — F5101 Primary insomnia: Secondary | ICD-10-CM | POA: Diagnosis not present

## 2018-11-08 DIAGNOSIS — F411 Generalized anxiety disorder: Secondary | ICD-10-CM

## 2018-11-08 LAB — POCT URINALYSIS DIPSTICK
Spec Grav, UA: <=1.005 — AB (ref 1.010–1.025)
pH, UA: 5 (ref 5.0–8.0)

## 2018-11-08 NOTE — Progress Notes (Signed)
   Subjective:  Patient arrives office with numerous concerns  Patient ID: Katherine Lane, female    DOB: 06-30-1932, 85 y.o.   MRN: [id number]  HPIdark urine for a few months and odor to urine. Pt just wants to be sure she does not have an infection before her procedure with Dr. Oneida Alar.  No dysuria.  No increased frequency.  No fever no nausea.  Just a dark color and sometimes even inform checkup  Bilateral leg pain with weather change.  Worse towards the knees.  Uses Tylenol intermittently.  Unfortunately this seems that cause constipation also.  Patient compliant with insomnia medication. Generally takes most nights. No obvious morning drowsiness. Definitely helps patient sleep. Without it patient states would not get a good nights rest.  Generalized anxiety.  Ongoing uses benzodiazepines with success.  Flu vaccine today.     Results for orders placed or performed in visit on 11/08/18  POCT urinalysis dipstick  Result Value Ref Range   Color, UA     Clarity, UA     Glucose, UA     Bilirubin, UA     Ketones, UA     Spec Grav, UA <=1.005 (A) 1.010 - 1.025   Blood, UA     pH, UA 5.0 5.0 - 8.0   Protein, UA     Urobilinogen, UA     Nitrite, UA     Leukocytes, UA     Appearance     Odor      Notes dark color urine at ties   Pt notes leg pain both legs   And also  Feels  achey  Takes occas tyleno if nedessary   Can cause and add to constipation      Review of Systems No headache, no major weight loss or weight gain, no chest pain no back pain abdominal pain no change in bowel habits complete ROS otherwise negative     Objective:   Physical Exam  Alert and oriented, vitals reviewed and stable, NAD ENT-TM's and ext canals WNL bilat via otoscopic exam Soft palate, tonsils and post pharynx WNL via oropharyngeal exam Neck-symmetric, no masses; thyroid nonpalpable and nontender Pulmonary-no tachypnea or accessory muscle use; Clear without wheezes via auscultation  Card--no abnrml murmurs, rhythm not reg and rate WNL Carotid pulses symmetric, without bruits  Right knee crepitations left knee mild crepitations trace edema both ankles arterial pulses good  Urinalysis within normal limits    Assessment & Plan:  Impression urinary tract infection concerns.  None evident.  Discussed  Imaging leg pain.  Likely arthritis.  Use Tylenol for pain.  Regular exercise encouraged.  3.  Insomnia ongoing need for meds meds refilled  4.  Hypertension followed by cardiologist  5.  Due to have banding for internal hemorrhoids syndrome.  Patient was wondering how this can go.  6.  Atrial fibrillation and compliant meds  Follow-up in 6 months diet exercise discussed  Greater than 50% of this 25 minute face to face visit was spent in counseling and discussion and coordination of care regarding the above diagnosis/diagnosies Flu shot today

## 2018-11-09 ENCOUNTER — Telehealth: Payer: Self-pay

## 2018-11-09 NOTE — Telephone Encounter (Signed)
Spoke with pt, she is aware that it's ok to take Ambien the night prior to her procedure and will bring a non mineral oil enema.

## 2018-11-09 NOTE — Telephone Encounter (Signed)
Pt called with a few questions. Pt has a procedure flexible sigmoidoscopy with banding  on 11/19/2018. Pt wanted to know if it was ok for her to take her Ambien the night prior to the procedure or should she skip that night? Pt also says she was told to do one enema and her paper says 2 enemas. Pt would like clarity on how many enemas she should do. Please advise.

## 2018-11-09 NOTE — Progress Notes (Signed)
ON RECALL  °

## 2018-11-09 NOTE — Telephone Encounter (Signed)
PLEASE CALL PT. She can take [place]. She can bring one enema to preop. SHE SHOULD AVOID THE MINERAL OIL ENEMA.

## 2018-11-12 ENCOUNTER — Telehealth: Payer: Self-pay | Admitting: Gastroenterology

## 2018-11-12 NOTE — Telephone Encounter (Signed)
ROBIN, PATIENT DAUGHTER [phone], HAS SOME QUESTIONS ABOUT THE ENEMAS THAT NEED TO BE DONE PRIOR TO HER PROCEDURE

## 2018-11-12 NOTE — Telephone Encounter (Signed)
Danie Binder, MD Note   PLEASE CALL PT. She can take [place]. She can bring one enema to preop. SHE SHOULD AVOID THE MINERAL OIL ENEMA.

## 2018-11-12 NOTE — Telephone Encounter (Signed)
LMOVM for Katherine Lane

## 2018-11-15 ENCOUNTER — Telehealth: Payer: Self-pay | Admitting: Gastroenterology

## 2018-11-15 NOTE — Telephone Encounter (Signed)
[phone] please call patient, she has a question about the enemas for her procedure

## 2018-11-15 NOTE — Telephone Encounter (Signed)
Called patient. Made her aware of SLF recs from last TE note. "She can bring one enema to preop. SHE SHOULD AVOID THE MINERAL OIL ENEMA."  Patient needed nothing further

## 2018-11-17 ENCOUNTER — Other Ambulatory Visit: Payer: Self-pay

## 2018-11-17 ENCOUNTER — Other Ambulatory Visit (HOSPITAL_COMMUNITY)
Admission: RE | Admit: 2018-11-17 | Discharge: 2018-11-17 | Disposition: A | Discharge status: Home / Self Care | Payer: Medicare Other | Source: Ambulatory Visit | Attending: Gastroenterology | Admitting: Gastroenterology

## 2018-11-17 DIAGNOSIS — Z20828 Contact with and (suspected) exposure to other viral communicable diseases: Secondary | ICD-10-CM | POA: Insufficient documentation

## 2018-11-18 LAB — SARS CORONAVIRUS 2 (TAT 6-24 HRS): SARS Coronavirus 2: NEGATIVE

## 2018-11-19 ENCOUNTER — Ambulatory Visit (HOSPITAL_COMMUNITY)
Admission: RE | Admit: 2018-11-19 | Discharge: 2018-11-19 | Disposition: A | Discharge status: Home / Self Care | Payer: Medicare Other | Attending: Gastroenterology | Admitting: Gastroenterology

## 2018-11-19 ENCOUNTER — Encounter (HOSPITAL_COMMUNITY)
Admission: RE | Disposition: A | Discharge status: Home / Self Care | Payer: Self-pay | Source: Home / Self Care | Attending: Gastroenterology

## 2018-11-19 ENCOUNTER — Encounter (HOSPITAL_COMMUNITY): Payer: Self-pay | Admitting: Gastroenterology

## 2018-11-19 DIAGNOSIS — G4700 Insomnia, unspecified: Secondary | ICD-10-CM | POA: Diagnosis not present

## 2018-11-19 DIAGNOSIS — I509 Heart failure, unspecified: Secondary | ICD-10-CM | POA: Insufficient documentation

## 2018-11-19 DIAGNOSIS — M1990 Unspecified osteoarthritis, unspecified site: Secondary | ICD-10-CM | POA: Insufficient documentation

## 2018-11-19 DIAGNOSIS — Z79899 Other long term (current) drug therapy: Secondary | ICD-10-CM | POA: Diagnosis not present

## 2018-11-19 DIAGNOSIS — Z9841 Cataract extraction status, right eye: Secondary | ICD-10-CM | POA: Diagnosis not present

## 2018-11-19 DIAGNOSIS — Z7901 Long term (current) use of anticoagulants: Secondary | ICD-10-CM | POA: Diagnosis not present

## 2018-11-19 DIAGNOSIS — K648 Other hemorrhoids: Secondary | ICD-10-CM | POA: Insufficient documentation

## 2018-11-19 DIAGNOSIS — K625 Hemorrhage of anus and rectum: Secondary | ICD-10-CM | POA: Diagnosis not present

## 2018-11-19 DIAGNOSIS — Z961 Presence of intraocular lens: Secondary | ICD-10-CM | POA: Insufficient documentation

## 2018-11-19 DIAGNOSIS — Z885 Allergy status to narcotic agent status: Secondary | ICD-10-CM | POA: Insufficient documentation

## 2018-11-19 DIAGNOSIS — I110 Hypertensive heart disease with heart failure: Secondary | ICD-10-CM | POA: Insufficient documentation

## 2018-11-19 DIAGNOSIS — F419 Anxiety disorder, unspecified: Secondary | ICD-10-CM | POA: Insufficient documentation

## 2018-11-19 DIAGNOSIS — Z888 Allergy status to other drugs, medicaments and biological substances status: Secondary | ICD-10-CM | POA: Diagnosis not present

## 2018-11-19 DIAGNOSIS — K6289 Other specified diseases of anus and rectum: Secondary | ICD-10-CM | POA: Diagnosis not present

## 2018-11-19 DIAGNOSIS — Z9842 Cataract extraction status, left eye: Secondary | ICD-10-CM | POA: Insufficient documentation

## 2018-11-19 DIAGNOSIS — K921 Melena: Secondary | ICD-10-CM | POA: Diagnosis present

## 2018-11-19 DIAGNOSIS — I4891 Unspecified atrial fibrillation: Secondary | ICD-10-CM | POA: Diagnosis not present

## 2018-11-19 HISTORY — PX: HEMORRHOID BANDING: SHX5850

## 2018-11-19 HISTORY — PX: FLEXIBLE SIGMOIDOSCOPY: SHX5431

## 2018-11-19 SURGERY — SIGMOIDOSCOPY, FLEXIBLE
Anesthesia: Moderate Sedation

## 2018-11-19 MED ORDER — MIDAZOLAM HCL 5 MG/5ML IJ SOLN
INTRAMUSCULAR | Status: DC | PRN
  Administered 2018-11-19: 2 mg via INTRAVENOUS

## 2018-11-19 MED ORDER — ONDANSETRON HCL 4 MG/2ML IJ SOLN
4.0000 mg | Freq: Once | INTRAMUSCULAR | Status: AC
  Administered 2018-11-19: 4 mg via INTRAVENOUS

## 2018-11-19 MED ORDER — ONDANSETRON HCL 4 MG/2ML IJ SOLN
INTRAMUSCULAR | Status: AC
  Filled 2018-11-19: qty 2

## 2018-11-19 MED ORDER — MEPERIDINE HCL 100 MG/ML IJ SOLN
INTRAMUSCULAR | Status: DC | PRN
  Administered 2018-11-19: 25 mg

## 2018-11-19 MED ORDER — MEPERIDINE HCL 100 MG/ML IJ SOLN
INTRAMUSCULAR | Status: AC
  Filled 2018-11-19: qty 1

## 2018-11-19 MED ORDER — SODIUM CHLORIDE 0.9 % IV SOLN
INTRAVENOUS | Status: DC
  Administered 2018-11-19: 07:00:00 via INTRAVENOUS

## 2018-11-19 MED ORDER — MIDAZOLAM HCL 5 MG/5ML IJ SOLN
INTRAMUSCULAR | Status: AC
  Filled 2018-11-19: qty 5

## 2018-11-19 NOTE — Interval H&P Note (Signed)
History and Physical Interval Note:  11/19/2018 7:38 AM  Katherine Lane  has presented today for surgery, with the diagnosis of rectal bleeding/rectal pain.  The various methods of treatment have been discussed with the patient and family. After consideration of risks, benefits and other options for treatment, the patient has consented to  Procedure(s) with comments: FLEXIBLE SIGMOIDOSCOPY (N/A) - 7:30am HEMORRHOID BANDING (N/A) as a surgical intervention.  The patient's history has been reviewed, patient examined, no change in status, stable for surgery.  I have reviewed the patient's chart and labs.  Questions were answered to the patient's satisfaction.     [employer]

## 2018-11-19 NOTE — Discharge Instructions (Signed)
You have external and internal hemorrhoids. I placed 2 bands ON YOUR INTERNAL HEMORRHOIDS to treat your rectal bleeding/pain.    Re-start XARELTO OCT 4.  YOU CAN CONTINUE YOUR MAGNESIUM AT BEDTIME.  CALL [phone] IF YOUR RECTAL BLEEDING OR RECTAL PAIN GETS WORSE OR IF YOU HAVE FEVER, OR PROBLEMS URINATING.   USE COLACE IF NEEDED TO SOFTEN STOOL.  USE PREPARATION H FOUR TIMES  A DAY IF NEEDED TO RELIEVE RECTAL PAIN/PRESSURE/BLEEDING.  FOLLOW UP IN 6 MOS.      ENDOSCOPY Care After Read the instructions outlined below and refer to this sheet in the next week. These discharge instructions provide you with general information on caring for yourself after you leave the hospital. While your treatment has been planned according to the most current medical practices available, unavoidable complications occasionally occur. If you have any problems or questions after discharge, call DR. Devon Pretty, [phone].  ACTIVITY  You may resume your regular activity, but move at a slower pace for the next 24 hours.   Take frequent rest periods for the next 24 hours.   Walking will help get rid of the air and reduce the bloated feeling in your belly (abdomen).   No driving for 24 hours (because of the medicine (anesthesia) used during the test).   You may shower.   Do not sign any important legal documents or operate any machinery for 24 hours (because of the anesthesia used during the test).    NUTRITION  Drink plenty of fluids.   You may resume your normal diet as instructed by your doctor.   Begin with a light meal and progress to your normal diet. Heavy or fried foods are harder to digest and may make you feel sick to your stomach (nauseated).   Avoid alcoholic beverages for 24 hours or as instructed.    MEDICATIONS  You may resume your normal medications.   WHAT YOU CAN EXPECT TODAY  Some feelings of bloating in the abdomen.   Passage of more gas than usual.   Spotting  of blood in your stool or on the toilet paper  .  IF YOU HADBIOPSIES TAKEN  DURING THE SIGMOIDOSCOPY/UPPER ENDOSCOPY:  Eat a soft diet IF YOU HAVE NAUSEA, BLOATING, ABDOMINAL PAIN, OR VOMITING.    FINDING OUT THE RESULTS OF YOUR TEST Not all test results are available during your visit. DR. Oneida Alar WILL CALL YOU WITHIN 7 DAYS OF YOUR PROCEDUE WITH YOUR RESULTS. Do not assume everything is normal if you have not heard from DR. Quashawn Jewkes IN ONE WEEK, CALL HER OFFICE AT [phone].  SEEK IMMEDIATE MEDICAL ATTENTION AND CALL THE OFFICE: [phone] IF:  You have more than a spotting of blood in your stool.   Your belly is swollen (abdominal distention).   You are nauseated or vomiting.   You have a temperature over 101F.   You have abdominal pain or discomfort that is severe or gets worse throughout the day.    HEMORRHOIDAL BANDING COMPLICATIONS:  COMMON: 1. MINOR PAIN  UNCOMMON: 1. ABSCESS 2. BAND FALLS OFF 3. PROLAPSE OF HEMORRHOIDS AND PAIN 4. ULCER BLEEDING  A. USUALLY SELF-LIMITED: MAY LAST 3-5 DAYS  B. MAY REQUIRE INTERVENTION: 1-2 WEEKS AFTER INTERACTIONS 5. NECROTIZING PELVIC SEPSIS  A. SYMPTOMS: FEVER, PAIN, DIFFICULTY URINATING

## 2018-11-19 NOTE — Op Note (Addendum)
[facility] Patient Name: Katherine Lane Procedure Date: 11/19/2018 7:06 AM MRN: [id number] Date of Birth: 08-22-1932 Attending MD: Jonette Eva MD, MD CSN: [id number] Age: 85 Admit Type: Outpatient Procedure:                Flexible Sigmoidoscopy WITH HEMORRHOID BANDING Indications:              Hematochezia, Rectal pain Providers:                Jonette Eva MD, MD, Jannett Celestine, RN, Dyann Ruddle Referring MD:             Simone Curia, MD Medicines:                Meperidine 25 mg IV, Midazolam 2 mg IV, Ondansetron                            4 mg IV Complications:            No immediate complications. Estimated Blood Loss:     Estimated blood loss was minimal. Procedure:                Pre-Anesthesia Assessment:                           - Prior to the procedure, a History and Physical                            was performed, and patient medications and                            allergies were reviewed. The patient's tolerance of                            previous anesthesia was also reviewed. The risks                            and benefits of the procedure and the sedation                            options and risks were discussed with the patient.                            All questions were answered, and informed consent                            was obtained. Prior Anticoagulants: The patient has                            taken no previous anticoagulant or antiplatelet                            agents. ASA Grade Assessment: II - A patient with                            mild systemic disease. After reviewing the risks  and benefits, the patient was deemed in                            satisfactory condition to undergo the procedure.                            After obtaining informed consent, the scope was                            passed under direct vision. The GIF-[id number] ([id number]                            scope was introduced through  the anus and advanced                            to the the sigmoid colon. The flexible                            sigmoidoscopy was accomplished without difficulty.                            The patient tolerated the procedure well. The                            quality of the bowel preparation was adequate. Scope In: 7:47:08 AM Scope Out: 7:58:08 AM Total Procedure Duration: 0 hours 11 minutes 0 seconds  Findings:      Internal hemorrhoids were found during retroflexion and during perianal       exam. The hemorrhoids were large. Two bands were successfully placed.       There was no bleeding at the end of the procedure.      The exam was otherwise without abnormality. Impression:               - Internal hemorrhoids. Banded.                           - The examination was otherwise normal. Moderate Sedation:      Moderate (conscious) sedation was administered by the endoscopy nurse       and supervised by the endoscopist. The following parameters were       monitored: oxygen saturation, heart rate, blood pressure, and response       to care. Total physician intraservice time was 18 minutes. Recommendation:           - Patient has a contact number available for                            emergencies. The signs and symptoms of potential                            delayed complications were discussed with the                            patient. Return to normal activities tomorrow.  Written discharge instructions were provided to the                            patient.                           - Resume previous diet.                           - Continue present medications. RE-START XARELTO                            OCT 4. OK TO CONTINUE Mg. COLACE/PREPARATION H PRN.                           - Return to my office in 6 months. Procedure Code(s):        --- Professional ---                           [phone], Sigmoidoscopy, flexible; with band                             ligation(s) (eg, hemorrhoids)                           G0500, Moderate sedation services provided by the                            same physician or other qualified health care                            professional performing a gastrointestinal                            endoscopic service that sedation supports,                            requiring the presence of an independent trained                            observer to assist in the monitoring of the                            patient's level of consciousness and physiological                            status; initial 15 minutes of intra-service time;                            patient age 21 years or older (additional time may                            be reported with [phone], as appropriate) Diagnosis Code(s):        --- Professional ---  K64.8, Other hemorrhoids                           K92.1, Melena (includes Hematochezia)                           K62.89, Other specified diseases of anus and rectum CPT copyright 2019 [employer]. All rights reserved. The codes documented in this report are preliminary and upon coder review may  be revised to meet current compliance requirements. Jonette Eva, MD Jonette Eva MD, MD 11/19/2018 8:29:15 AM This report has been signed electronically. Number of Addenda: 0

## 2018-11-22 ENCOUNTER — Telehealth: Payer: Self-pay | Admitting: Gastroenterology

## 2018-11-22 NOTE — Telephone Encounter (Signed)
PT is aware of recommendations.

## 2018-11-22 NOTE — Telephone Encounter (Signed)
PLEASE CALL PT. IT IS OK FOR HER TO EAT AND TO HAVE A BM. THE BANDS HAVE BEEN ON FOR 3 DAYS. IT'S OK IF THE BANDS FALL OFF AT THIS POINT. CONTINUE COLACE. AVOID STRAINING WHICH CAN INCREASE LIKELIHOOD OF BLEEDING.

## 2018-11-22 NOTE — Telephone Encounter (Signed)
I spoke to Katherine Lane and she had flex sig and hemorrhoid banding on Friday 11/19/2018. She has not eaten much so had very little BM since procedure. However, she feels the need to strain and she is afraid that will affect the bands. She is taking colace but wants to know if there is anything more that she needs to do to avoid causing problems to the bands that were placed.  Dr. Oneida Alar, please advise!

## 2018-11-22 NOTE — Telephone Encounter (Signed)
[place], HAVING PROBLEMS GOING TO THE BATHROOM AFTER HER PROCEDURE, AFRAID TO STRAIN.

## 2018-11-23 ENCOUNTER — Telehealth: Payer: Self-pay | Admitting: Gastroenterology

## 2018-11-23 NOTE — Telephone Encounter (Signed)
PLEASE CALL PATIENT [phone] SHE SAID SOMETHING IS NOT RIGHT WITH HER HEMORRHOIDS-SOME ARE COMING OUT

## 2018-11-23 NOTE — Telephone Encounter (Signed)
PLEASE CALL PT. SHE HAS INTERNAL AND EXTERNAL HEMORRHOID. HER INTERNAL HEMORRHOIDS WERE ADDRESSED WITH BANDING. ONLY CREAMS/OINTMENT OR SURGERY CAN ADDRESS THE EXTERNAL HEMORRHOIDS.  SHE CAN USE HEMORRHOID CREAM OR OINTMENT AND THEN LET ME KNOW IF SHE WOULD LIKE TO SEE THE SURGEON.

## 2018-11-23 NOTE — Telephone Encounter (Signed)
Pt is aware of recommendation and will use the cream that she has. She will call if needed, she really wants to avoid surgery if at all possible.

## 2018-11-23 NOTE — Telephone Encounter (Signed)
Pt said she had a lot of pressure this morning before BM and had to strain a little. She said her hemorrhoids have come out and she is fearful. She is not having pain as much as just aggravating because she has suffered so much from the hemorrhoids.  She is sitting on some ice and said she is just so upset that they have come out. Dr. Oneida Alar, please advise!

## 2018-11-26 ENCOUNTER — Other Ambulatory Visit: Payer: Self-pay

## 2018-11-26 ENCOUNTER — Encounter (HOSPITAL_COMMUNITY): Payer: Self-pay | Admitting: Gastroenterology

## 2018-11-26 DIAGNOSIS — Z20822 Contact with and (suspected) exposure to covid-19: Secondary | ICD-10-CM

## 2018-11-27 LAB — NOVEL CORONAVIRUS, NAA: SARS-CoV-2, NAA: DETECTED — AB

## 2018-11-29 ENCOUNTER — Encounter (HOSPITAL_COMMUNITY): Payer: Self-pay | Admitting: *Deleted

## 2018-11-29 ENCOUNTER — Emergency Department (HOSPITAL_COMMUNITY)
Admission: EM | Admit: 2018-11-29 | Discharge: 2018-11-29 | Disposition: A | Discharge status: Home / Self Care | Payer: Medicare Other | Attending: Emergency Medicine | Admitting: Emergency Medicine

## 2018-11-29 ENCOUNTER — Other Ambulatory Visit: Payer: Self-pay

## 2018-11-29 ENCOUNTER — Emergency Department (HOSPITAL_COMMUNITY): Payer: Medicare Other

## 2018-11-29 DIAGNOSIS — J189 Pneumonia, unspecified organism: Secondary | ICD-10-CM | POA: Diagnosis not present

## 2018-11-29 DIAGNOSIS — I110 Hypertensive heart disease with heart failure: Secondary | ICD-10-CM | POA: Insufficient documentation

## 2018-11-29 DIAGNOSIS — U071 COVID-19: Secondary | ICD-10-CM | POA: Diagnosis not present

## 2018-11-29 DIAGNOSIS — I5032 Chronic diastolic (congestive) heart failure: Secondary | ICD-10-CM | POA: Diagnosis not present

## 2018-11-29 DIAGNOSIS — Z79899 Other long term (current) drug therapy: Secondary | ICD-10-CM | POA: Diagnosis not present

## 2018-11-29 DIAGNOSIS — R079 Chest pain, unspecified: Secondary | ICD-10-CM | POA: Diagnosis present

## 2018-11-29 LAB — CBC WITH DIFFERENTIAL/PLATELET
Abs Immature Granulocytes: 0.02 10*3/uL (ref 0.00–0.07)
Basophils Absolute: 0 10*3/uL (ref 0.0–0.1)
Basophils Relative: 0 %
Eosinophils Absolute: 0 10*3/uL (ref 0.0–0.5)
Eosinophils Relative: 0 %
HCT: 41.1 % (ref 36.0–46.0)
Hemoglobin: 13.4 g/dL (ref 12.0–15.0)
Immature Granulocytes: 0 %
Lymphocytes Relative: 15 %
Lymphs Abs: 1.1 10*3/uL (ref 0.7–4.0)
MCH: 30.2 pg (ref 26.0–34.0)
MCHC: 32.6 g/dL (ref 30.0–36.0)
MCV: 92.6 fL (ref 80.0–100.0)
Monocytes Absolute: 1 10*3/uL (ref 0.1–1.0)
Monocytes Relative: 13 %
Neutro Abs: 5.3 10*3/uL (ref 1.7–7.7)
Neutrophils Relative %: 72 %
Platelets: 272 10*3/uL (ref 150–400)
RBC: 4.44 MIL/uL (ref 3.87–5.11)
RDW: 14.3 % (ref 11.5–15.5)
WBC: 7.5 10*3/uL (ref 4.0–10.5)
nRBC: 0 % (ref 0.0–0.2)

## 2018-11-29 LAB — COMPREHENSIVE METABOLIC PANEL
ALT: 20 U/L (ref 0–44)
AST: 24 U/L (ref 15–41)
Albumin: 4.1 g/dL (ref 3.5–5.0)
Alkaline Phosphatase: 70 U/L (ref 38–126)
Anion gap: 10 (ref 5–15)
BUN: 14 mg/dL (ref 8–23)
CO2: 25 mmol/L (ref 22–32)
Calcium: 9 mg/dL (ref 8.9–10.3)
Chloride: 102 mmol/L (ref 98–111)
Creatinine, Ser: 0.73 mg/dL (ref 0.44–1.00)
GFR calc Af Amer: >60 mL/min (ref >60)
GFR calc non Af Amer: >60 mL/min (ref >60)
Glucose, Bld: 112 mg/dL — ABNORMAL HIGH (ref 70–99)
Potassium: 3.3 mmol/L — ABNORMAL LOW (ref 3.5–5.1)
Sodium: 137 mmol/L (ref 135–145)
Total Bilirubin: 1.2 mg/dL (ref 0.3–1.2)
Total Protein: 6.8 g/dL (ref 6.5–8.1)

## 2018-11-29 LAB — TROPONIN I (HIGH SENSITIVITY)
Troponin I (High Sensitivity): 8 ng/L (ref <18)
Troponin I (High Sensitivity): 7 ng/L (ref <18)

## 2018-11-29 LAB — C-REACTIVE PROTEIN: CRP: 2.4 mg/dL — ABNORMAL HIGH (ref <1.0)

## 2018-11-29 LAB — PROCALCITONIN: Procalcitonin: <0.1 ng/mL

## 2018-11-29 LAB — D-DIMER, QUANTITATIVE: D-Dimer, Quant: 0.37 ug/mL-FEU (ref 0.00–0.50)

## 2018-11-29 LAB — LACTATE DEHYDROGENASE: LDH: 142 U/L (ref 98–192)

## 2018-11-29 LAB — TRIGLYCERIDES: Triglycerides: 71 mg/dL (ref <150)

## 2018-11-29 LAB — FERRITIN: Ferritin: 50 ng/mL (ref 11–307)

## 2018-11-29 LAB — FIBRINOGEN: Fibrinogen: 428 mg/dL (ref 210–475)

## 2018-11-29 LAB — LACTIC ACID, PLASMA: Lactic Acid, Venous: 1.1 mmol/L (ref 0.5–1.9)

## 2018-11-29 MED ORDER — PREDNISONE 20 MG PO TABS: ORAL_TABLET | ORAL | 0 refills | Status: DC

## 2018-11-29 MED ORDER — SODIUM CHLORIDE 0.9 % IV SOLN
1000.0000 mL | INTRAVENOUS | Status: DC
  Administered 2018-11-29: 1000 mL via INTRAVENOUS

## 2018-11-29 MED ORDER — DOXYCYCLINE HYCLATE 100 MG PO CAPS: 100.0000 mg | ORAL_CAPSULE | Freq: Two times a day (BID) | ORAL | 0 refills | Status: DC

## 2018-11-29 MED ORDER — POTASSIUM CHLORIDE CRYS ER 20 MEQ PO TBCR
40.0000 meq | EXTENDED_RELEASE_TABLET | Freq: Once | ORAL | Status: AC
  Administered 2018-11-29: 40 meq via ORAL
  Filled 2018-11-29: qty 2

## 2018-11-29 MED ORDER — LOSARTAN POTASSIUM 25 MG PO TABS
50.0000 mg | ORAL_TABLET | Freq: Once | ORAL | Status: AC
  Administered 2018-11-29: 50 mg via ORAL
  Filled 2018-11-29: qty 2

## 2018-11-29 MED ORDER — HYDRALAZINE HCL 25 MG PO TABS
50.0000 mg | ORAL_TABLET | Freq: Once | ORAL | Status: AC
  Administered 2018-11-29: 50 mg via ORAL
  Filled 2018-11-29: qty 2

## 2018-11-29 MED ORDER — SODIUM CHLORIDE 0.9 % IV SOLN
1.0000 g | Freq: Once | INTRAVENOUS | Status: AC
  Administered 2018-11-29: 1 g via INTRAVENOUS
  Filled 2018-11-29: qty 10

## 2018-11-29 NOTE — ED Triage Notes (Signed)
Pt diagnosed with covid yesterday, started having chest pressure to the back tonight

## 2018-11-29 NOTE — ED Notes (Signed)
Pt has been discharged. Awaiting ride to go home.

## 2018-11-29 NOTE — Discharge Instructions (Signed)
Take the medications as prescribed.  You can take acetaminophen for your chest pain.  You can take Mucinex DM over-the-counter for your cough.  Take the medications as prescribed.  Return to the emergency department if you get worse such as a very high fever, you are struggling to breathe, or you have uncontrolled vomiting or diarrhea.  Please self quarantine yourself for at least the next 10 days.

## 2018-11-29 NOTE — ED Provider Notes (Addendum)
[facility] EMERGENCY DEPARTMENT Provider Note   CSN: [id number] Arrival date & time: 11/29/18  0421   Time seen 5:39 AM  History   Chief Complaint Chief Complaint  Patient presents with  . Chest Pain    HPI Katherine Lane is a 85 y.o. female.     HPI patient states her son-in-law was diagnosed with Covid 8 days ago and her daughter was diagnosed on October 9.  She states her daughter came to her house on October 8 and just walked through her house.  She states she herself had a positive Covid test on October 9.  She states she was feeling fine until about midnight tonight when she started having upper chest pain.  She states the pain is pleuritic.  She denies cough or feeling shortness of breath.  She states she had a temperature tonight of 99.  She denies nausea or vomiting but states she gagged once from sinus drainage.  She denies sore throat.  She states she can smell and taste fine.  Patient states she is on Xarelto for her atrial fib and she has been taking it. Pt is obsessing about her blood pressure.   PCP Merlyn Albert, MD   Past Medical History:  Diagnosis Date  . A-fib (HCC)   . Anxiety   . Arthritis   . CHF (congestive heart failure) (HCC)   . Chronic back pain   . Constipation   . Constipation, chronic   . Depression   . Family history of adverse reaction to anesthesia    Mother, Sister, daughter- N/V  . History of blood transfusion    "pregnancy"  . HTN (hypertension)   . Insomnia   . Migraine headache   . Osteoporosis   . PONV (postoperative nausea and vomiting)    "violent"  . Pulmonary edema 06/2016  . Sodium (Na) deficiency   . Trigeminal neuralgia     Patient Active Problem List   Diagnosis Date Noted  . Rectal bleeding   . Rectal pain   . Hemorrhoids, internal, with bleeding 01/06/2018  . Hypertensive urgency 12/16/2017  . Intractable episodic headache   . Visual changes   . Depression with anxiety   . Chronic diastolic congestive  heart failure (HCC) 10/31/2017  . Atypical chest pain 10/31/2017  . Compression fracture of L1 lumbar vertebra (HCC) 02/19/2017  . Lumbar compression fracture (HCC) 12/26/2016  . Gastroesophageal reflux disease without esophagitis 07/18/2016  . Acute on chronic diastolic CHF (congestive heart failure) (HCC) 07/11/2016  . Chronic coughing 07/11/2016  . Pulmonary edema 07/10/2016  . Migraine headache 07/01/2016  . Anxiety 06/30/2016  . Altered mental status 12/06/2015  . Prolonged Q-T interval on ECG 12/06/2015  . Postural dizziness with presyncope 12/06/2015  . PAF (paroxysmal atrial fibrillation) (HCC) 07/14/2013  . Atrial flutter (HCC) 05/12/2013  . Bulging discs 04/20/2013  . Trigeminal neuralgia 01/15/2013  . Cellulitis 07/31/2011  . Mechanical complication of internal orthopedic implant (HCC) 07/21/2011  . 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 Ankle fracture 07/02/2010  . LESION OF PLANTAR NERVE 05/14/2009  . CLOSED BIMALLEOLAR FRACTURE 03/01/2009  . Essential hypertension 11/07/2008  . CONSTIPATION, CHRONIC 11/07/2008  . Osteoporosis 11/07/2008    Past Surgical History:  Procedure Laterality Date  . ANKLE FRACTURE SURGERY Right   . CARDIAC CATHETERIZATION     denies  . CATARACT EXTRACTION W/PHACO Right 07/22/2012   Procedure: CATARACT EXTRACTION PHACO AND INTRAOCULAR LENS PLACEMENT (IOC);  Surgeon: Gemma Payor, MD;  Location:  [facility];  Service: Ophthalmology;  Laterality: Right;  CDE:  18.93  . CATARACT EXTRACTION W/PHACO Left 08/09/2012   Procedure: CATARACT EXTRACTION PHACO AND INTRAOCULAR LENS PLACEMENT (IOC);  Surgeon: Gemma PayorKerry Hunt, MD;  Location: [facility];  Service: Ophthalmology;  Laterality: Left;  CDE: 17.21  . COLONOSCOPY    . FLEXIBLE SIGMOIDOSCOPY N/A 11/19/2018   Procedure: FLEXIBLE SIGMOIDOSCOPY;  Surgeon: West BaliFields, Sandi L, MD;  Location: [facility];  Service: Endoscopy;  Laterality: N/A;  7:30am  . HARDWARE REMOVAL  07/18/2011   Procedure: HARDWARE  REMOVAL; Right leg-  Surgeon: Vickki HearingStanley E Harrison, MD;  Location: [facility];  Service: Orthopedics;  Laterality: Right;  . HEMORRHOID BANDING N/A 11/19/2018   Procedure: HEMORRHOID BANDING;  Surgeon: West BaliFields, Sandi L, MD;  Location: [facility];  Service: Endoscopy;  Laterality: N/A;  . KYPHOPLASTY N/A 12/26/2016   Procedure: Lumbar two Lumbar three and Lumbar five KYPHOPLASTY;  Surgeon: Maeola HarmanStern, Joseph, MD;  [facility];  Service: Neurosurgery;  Laterality: N/A;  . KYPHOPLASTY N/A 02/19/2017   Procedure: KYPHOPLASTY LUMBAR ONE;  Surgeon: Maeola HarmanStern, Joseph, MD;  [facility];  Service: Neurosurgery;  Laterality: N/A;  KYPHOPLASTY LUMBAR ONE  . NERVE REPAIR  07/18/2011   Procedure: NERVE REPAIR;  Surgeon: Vickki HearingStanley E Harrison, MD;  Location: [facility];  Service: Orthopedics;  Laterality: Right;  Right leg superficial peroneal nerve release    . PARTIAL HYSTERECTOMY       OB History    Gravida  3   Para  3   Term  3   Preterm      AB      Living  3     SAB      TAB      Ectopic      Multiple      Live Births  3            Home Medications    Prior to Admission medications   Medication Sig Start Date End Date Taking? Authorizing Provider  clobetasol (TEMOVATE) 0.05 % external solution APPLY TO AFFECTED AREAS TWICE DAILY. Patient taking differently: Apply 1 application topically 2 (two) times daily as needed (psoriasis). . 12/02/17   Merlyn AlbertLuking, William S, MD  doxycycline (VIBRAMYCIN) 100 MG capsule Take 1 capsule (100 mg total) by mouth 2 (two) times daily. 11/29/18   Devoria AlbeKnapp, Kristene Liberati, MD  furosemide (LASIX) 20 MG tablet Take 20 mg by mouth daily as needed (weight gain/fluid retention.).     [provider]  hydrALAZINE (APRESOLINE) 50 MG tablet TAKE (1) TABLET BY MOUTH (4) TIMES DAILY. Patient taking differently: Take 50 mg by mouth 4 (four) times daily.  08/16/18   Laqueta LindenKoneswaran, Suresh A, MD  hydrocortisone (ANUSOL-HC) 2.5 % rectal cream APPLY RECTALLY THREE TIMES DAILY AS DIRECTED  Patient taking differently: Place 1 application rectally 2 (two) times daily.  10/19/18   Merlyn AlbertLuking, William S, MD  LORazepam (ATIVAN) 0.5 MG tablet TAKE ONE TABLET BY MOUTH UP TO TWICE DAILY AS NEEDED. Patient taking differently: Take 0.5 mg by mouth 2 (two) times daily as needed for anxiety.  11/06/18   Merlyn AlbertLuking, William S, MD  losartan (COZAAR) 50 MG tablet Take 50 mg  two times Daily at 7 AM and at 7 PM Patient taking differently: Take 50 mg by mouth 2 (two) times daily. Daily at 7 AM and at 7 PM 06/07/18   Dyann KiefLenze, Michele M, PA-C  Magnesium 250 MG TABS Take 250 mg by mouth at bedtime.  [provider]  metoprolol succinate (TOPROL XL) 50 MG 24 hr tablet Take 75 mg (1 1/2 tablets daily) Take with or immediately following a meal. Patient taking differently: Take 75 mg by mouth daily after breakfast. Take 75 mg (1 1/2 tablets daily) Take with or immediately following a meal. 10/18/18   Laqueta Linden, MD  metoprolol tartrate (LOPRESSOR) 25 MG tablet Take 25 mg by mouth 2 (two) times daily as needed (palpitation.).     [provider]  Multiple Vitamin (MULTIVITAMIN WITH MINERALS) TABS tablet Take 1 tablet by mouth daily. Centrum Silver    [provider]  Multiple Vitamins-Minerals (PRESERVISION AREDS 2 PO) Take 1 tablet by mouth 2 (two) times daily.    [provider]  ondansetron (ZOFRAN) 4 MG tablet Take 1 tablet (4 mg total) by mouth every 8 (eight) hours as needed for nausea or vomiting. 05/18/18   Merlyn Albert, MD  Polyethyl Glycol-Propyl Glycol (LUBRICANT EYE DROPS) 0.4-0.3 % SOLN Place 1 drop into both eyes at bedtime.    [provider]  polyethylene glycol powder (MIRALAX) powder Take 17 g daily as needed by mouth for moderate constipation.     [provider]  potassium chloride (K-DUR) 10 MEQ tablet Take 10 mEq by mouth every other day. In the morning. 04/14/18   [provider]  predniSONE (DELTASONE) 20 MG tablet Take 3  po the first day then 2 po QD x 2d then 1 po QD x 3d 11/29/18   Devoria Albe, MD  shark liver oil-cocoa butter (PREPARATION H) 0.25-3-85.5 % suppository Place 1 suppository rectally 2 (two) times daily as needed for hemorrhoids.     [provider]  XARELTO 20 MG TABS tablet TAKE 1 TABLET DAILY WITH SUPPER. Patient taking differently: Take 20 mg by mouth daily with supper.  06/22/18   Laqueta Linden, MD  zolpidem (AMBIEN) 10 MG tablet TAKE 1 TABLET BY MOUTH AT BEDTIME FOR SLEEP. Patient taking differently: Take 10 mg by mouth at bedtime. TAKE 1 TABLET BY MOUTH AT BEDTIME FOR SLEEP. 06/14/18   Merlyn Albert, MD    Family History Family History  Problem Relation Age of Onset  . Hypertension Mother   . Alzheimer's disease Mother   . Cancer Mother   . Hypertension Father   . Other Father        abused pain meds  . Diabetes Brother   . Other Brother        back problems  . Diabetes Daughter   . Anesthesia problems Neg Hx   . Hypotension Neg Hx   . Malignant hyperthermia Neg Hx   . Pseudochol deficiency Neg Hx     Social History Social History   Tobacco Use  . Smoking status: Never Smoker  . Smokeless tobacco: Never Used  Substance Use Topics  . Alcohol use: No    Alcohol/week: 0.0 standard drinks  . Drug use: No  lives at home Lives alone   Allergies   Amlodipine, Codeine, Other, and Tramadol   Review of Systems Review of Systems  All other systems reviewed and are negative.    Physical Exam Updated Vital Signs BP (!) 156/101   Pulse (!) 50   Temp 99.3 F (37.4 C) (Oral)   Resp (!) 23   Ht  (1.6 m)   Wt 59.3 kg   SpO2 96%   BMI 23.17 kg/m   Physical Exam Vitals signs and nursing note reviewed.  Constitutional:  General: She is not in acute distress.    Appearance: Normal appearance. She is well-developed. She is not ill-appearing or toxic-appearing.  HENT:     Head: Normocephalic and atraumatic.     Right Ear: External ear  normal.     Left Ear: External ear normal.     Nose: Nose normal. No mucosal edema or rhinorrhea.     Mouth/Throat:     Dentition: No dental abscesses.     Pharynx: No uvula swelling.  Eyes:     Conjunctiva/sclera: Conjunctivae normal.     Pupils: Pupils are equal, round, and reactive to light.  Neck:     Musculoskeletal: Full passive range of motion without pain, normal range of motion and neck supple.  Cardiovascular:     Rate and Rhythm: Normal rate and regular rhythm.     Heart sounds: Normal heart sounds. No murmur. No friction rub. No gallop.   Pulmonary:     Effort: Pulmonary effort is normal. No respiratory distress.     Breath sounds: Normal breath sounds. No wheezing, rhonchi or rales.  Chest:     Chest wall: No tenderness or crepitus.  Abdominal:     General: Bowel sounds are normal. There is no distension.     Palpations: Abdomen is soft.     Tenderness: There is no abdominal tenderness. There is no guarding or rebound.  Musculoskeletal: Normal range of motion.        General: No tenderness.     Comments: Moves all extremities well.   Skin:    General: Skin is warm and dry.     Coloration: Skin is not pale.     Findings: No erythema or rash.  Neurological:     Mental Status: She is alert and oriented to person, place, and time.     Cranial Nerves: No cranial nerve deficit.  Psychiatric:        Mood and Affect: Mood is not anxious.        Speech: Speech normal.        Behavior: Behavior normal.      ED Treatments / Results  Labs (all labs ordered are listed, but only abnormal results are displayed)  Results for orders placed or performed during the hospital encounter of 11/29/18  Blood Culture (routine x 2)   Specimen: BLOOD RIGHT HAND  Result Value Ref Range   Specimen Description      BLOOD RIGHT HAND BOTTLES DRAWN AEROBIC AND ANAEROBIC   Special Requests Blood Culture adequate volume    Culture      NO GROWTH <12 HOURS Performed at [facility], [address] [place], [place] [address]    Report Status PENDING   Blood Culture (routine x 2)   Specimen: BLOOD LEFT HAND  Result Value Ref Range   Specimen Description      BLOOD LEFT HAND BOTTLES DRAWN AEROBIC AND ANAEROBIC   Special Requests Blood Culture adequate volume    Culture      NO GROWTH <12 HOURS Performed at [facility], [address], [place], [place] [address]    Report Status PENDING   Lactic acid, plasma  Result Value Ref Range   Lactic Acid, Venous 1.1 0.5 - 1.9 mmol/L  CBC WITH DIFFERENTIAL  Result Value Ref Range   WBC 7.5 4.0 - 10.5 K/uL   RBC 4.44 3.87 - 5.11 MIL/uL   Hemoglobin 13.4 12.0 - 15.0 g/dL   HCT 82.9 56.2 - 13.0 %  MCV 92.6 80.0 - 100.0 fL   MCH 30.2 26.0 - 34.0 pg   MCHC 32.6 30.0 - 36.0 g/dL   RDW 14.3 11.5 - 15.5 %   Platelets 272 150 - 400 K/uL   nRBC 0.0 0.0 - 0.2 %   Neutrophils Relative % 72 %   Neutro Abs 5.3 1.7 - 7.7 K/uL   Lymphocytes Relative 15 %   Lymphs Abs 1.1 0.7 - 4.0 K/uL   Monocytes Relative 13 %   Monocytes Absolute 1.0 0.1 - 1.0 K/uL   Eosinophils Relative 0 %   Eosinophils Absolute 0.0 0.0 - 0.5 K/uL   Basophils Relative 0 %   Basophils Absolute 0.0 0.0 - 0.1 K/uL   Immature Granulocytes 0 %   Abs Immature Granulocytes 0.02 0.00 - 0.07 K/uL  Comprehensive metabolic panel  Result Value Ref Range   Sodium 137 135 - 145 mmol/L   Potassium 3.3 (L) 3.5 - 5.1 mmol/L   Chloride 102 98 - 111 mmol/L   CO2 25 22 - 32 mmol/L   Glucose, Bld 112 (H) 70 - 99 mg/dL   BUN 14 8 - 23 mg/dL   Creatinine, Ser 0.73 0.44 - 1.00 mg/dL   Calcium 9.0 8.9 - 10.3 mg/dL   Total Protein 6.8 6.5 - 8.1 g/dL   Albumin 4.1 3.5 - 5.0 g/dL   AST 24 15 - 41 U/L   ALT 20 0 - 44 U/L   Alkaline Phosphatase 70 38 - 126 U/L   Total Bilirubin 1.2 0.3 - 1.2 mg/dL   GFR calc non Af Amer >60 >60 mL/min   GFR calc Af Amer >60 >60 mL/min   Anion gap 10 5 - 15  D-dimer, quantitative  Result Value Ref Range   D-Dimer, Quant 0.37 0.00 -  0.50 ug/mL-FEU  Procalcitonin  Result Value Ref Range   Procalcitonin <0.10 ng/mL  Lactate dehydrogenase  Result Value Ref Range   LDH 142 98 - 192 U/L  Triglycerides  Result Value Ref Range   Triglycerides 71 <150 mg/dL  Fibrinogen  Result Value Ref Range   Fibrinogen 428 210 - 475 mg/dL  Troponin I (High Sensitivity)  Result Value Ref Range   Troponin I (High Sensitivity) 8 <18 ng/L  Troponin I (High Sensitivity)  Result Value Ref Range   Troponin I (High Sensitivity) 7 <18 ng/L   Laboratory interpretation all normal except mild hypokalemia   EKG EKG Interpretation  Date/Time:  Monday November 29 2018 04:35:09 EDT Ventricular Rate:  90 PR Interval:    QRS Duration: 87 QT Interval:  367 QTC Calculation: 449 R Axis:   97 Text Interpretation:  Atrial fibrillation Anterior infarct, old No significant change since last tracing 11 Apr 2018 Confirmed by Rolland Porter [phone]) on 11/29/2018 5:12:42 AM   Radiology Dg Chest Port 1 View  Result Date: 11/29/2018 CLINICAL DATA:  COVID-19 positive, chest pain EXAM: PORTABLE CHEST 1 VIEW COMPARISON:  Chest radiograph 03/04/2018, chest CT 05/11/2013 FINDINGS: Stable calcified granuloma in the right mid lung. There are some patchy areas of opacity in the right infrahilar lung and more confluent density present in the retrocardiac space. No pneumothorax or visible effusion. Cardiomediastinal contours are similar to prior accounting for differences in technique. The aorta is calcified and tortuous. No acute osseous or soft tissue abnormality. IMPRESSION: 1. Patchy areas of opacity in the right infrahilar lung and more confluent density in the retrocardiac space, concerning for multifocal pneumonia. 2. Unchanged calcified granuloma in the right mid  lung. 3.  Aortic Atherosclerosis (ICD10-I70.0). Electronically Signed   By: Kreg Shropshire M.D.   On: 11/29/2018 05:06    Procedures Procedures (including critical care time)  Medications Ordered in  ED Medications  0.9 %  sodium chloride infusion (1,000 mLs Intravenous New Bag/Given 11/29/18 0508)  hydrALAZINE (APRESOLINE) tablet 50 mg (50 mg Oral Given 11/29/18 0731)  losartan (COZAAR) tablet 50 mg (50 mg Oral Given 11/29/18 0731)  potassium chloride SA (KLOR-CON) CR tablet 40 mEq (40 mEq Oral Given 11/29/18 0731)  cefTRIAXone (ROCEPHIN) 1 g in sodium chloride 0.9 % 100 mL IVPB (1 g Intravenous New Bag/Given 11/29/18 0736)     Initial Impression / Assessment and Plan / ED Course  I have reviewed the triage vital signs and the nursing notes.  Pertinent labs & imaging results that were available during my care of the patient were reviewed by me and considered in my medical decision making (see chart for details).   Covid testing was done.  Chest x-ray was done.  After reviewing her x-ray she was given Rocephin and Zithromax IV.  After reviewing her lab work she was given 40 mEq of potassium orally.  Patient's pulse ox on room air was 97 to 99%.  Her laboratory tests are all normal.  At this point she can be treated at home.  Patient has history of prolonged QT and she has mild hypokalemia, I cannot give her Zithromax or Levaquin.  She was discharged home on doxycycline.  She also was sent home with prednisone.    Katherine Lane was evaluated in Emergency Department on 11/29/2018 for the symptoms described in the history of present illness. She was evaluated in the context of the global COVID-19 pandemic, which necessitated consideration that the patient might be at risk for infection with the SARS-CoV-2 virus that causes COVID-19. Institutional protocols and algorithms that pertain to the evaluation of patients at risk for COVID-19 are in a state of rapid change based on information released by regulatory bodies including the CDC and federal and state organizations. These policies and algorithms were followed during the patient's care in the ED.   Final Clinical Impressions(s) / ED  Diagnoses   Final diagnoses:  COVID-19  Community acquired pneumonia, unspecified laterality    ED Discharge Orders         Ordered    doxycycline (VIBRAMYCIN) 100 MG capsule  2 times daily     11/29/18 0811    predniSONE (DELTASONE) 20 MG tablet     11/29/18 4098         Plan discharge  Devoria Albe, MD, Concha Pyo, MD 11/29/18 1191    Devoria Albe, MD 11/29/18 [phone]

## 2018-11-30 ENCOUNTER — Telehealth: Payer: Self-pay | Admitting: Family Medicine

## 2018-11-30 NOTE — Telephone Encounter (Signed)
It is against [facility] policy to bring an active covid 19 patient to our office because of the risk to staff and other patients. We see folks in f u virtually, and then if sick enough or unstable we can refer onward to our urgicare facility here in town where they can do f u Health and safety inspector and o2 sats etc. OR depending on eval sometimes we send directly to ER  So, I can see Okie Raybuck tom morn virtually in f [place], and dr scott can see robin tomorrow after virt f u  If she feels her mom is unstable this aft and in need of urgent eval would rec back to er this eve

## 2018-11-30 NOTE — Telephone Encounter (Signed)
Left message on daughter Katherine Lane) voicemail to return call

## 2018-11-30 NOTE — Telephone Encounter (Signed)
Discussed with pt's daughter Shirlean Mylar and she verbalized understanding. She wanted to make the hospital follow up for one week out so virtual appt was scheduled for next Monday.

## 2018-11-30 NOTE — Telephone Encounter (Signed)
Please advise. Thank you

## 2018-11-30 NOTE — Telephone Encounter (Signed)
Daughter calling because they both tested positive for Covid and they both were seen in the ER yesterday and told to follow up here.  I offered a phone appt and she is insisted that her mom needs to be seen in office to check on her lungs.  She said they both have pneumonia.  I told her that we could not have her seen in office due to being Covid positive and she keeps insisting.  I told her I would check with Dr. Richardson Landry and have a nurse call her back.

## 2018-12-04 LAB — CULTURE, BLOOD (ROUTINE X 2)
Culture: NO GROWTH
Culture: NO GROWTH
Special Requests: ADEQUATE
Special Requests: ADEQUATE

## 2018-12-06 ENCOUNTER — Ambulatory Visit (INDEPENDENT_AMBULATORY_CARE_PROVIDER_SITE_OTHER): Payer: Medicare Other | Admitting: Family Medicine

## 2018-12-06 ENCOUNTER — Encounter: Payer: Self-pay | Admitting: Family Medicine

## 2018-12-06 DIAGNOSIS — U071 COVID-19: Secondary | ICD-10-CM

## 2018-12-06 NOTE — Progress Notes (Signed)
   Subjective:  Audio plus video  Patient ID: Katherine Lane, female    DOB: [birth year], 85 y.o.   MRN: [id number]  HPI  Patient calls for a follow up on recent ER visit for Covid. Patient was also diagnosed with pneumonia. Patient states she is feeling better and no longer has fever and oxygen level is steady.  Virtual Visit via Video Note  I connected with [name] on 12/06/18 at  9:00 AM EDT by a video enabled telemedicine application and verified that I am speaking with the correct person using two identifiers.  Location: Patient: home Provider: office   I discussed the limitations of evaluation and management by telemedicine and the availability of in person appointments. The patient expressed understanding and agreed to proceed.  History of Present Illness:    Observations/Objective:   Assessment and Plan:   Follow Up Instructions:    I discussed the assessment and treatment plan with the patient. The patient was provided an opportunity to ask questions and all were answered. The patient agreed with the plan and demonstrated an understanding of the instructions.   The patient was advised to call back or seek an in-person evaluation if the symptoms worsen or if the condition fails to improve as anticipated.  I provided 15 minutes of non-face-to-face time during this encounter.  Thankfully patient is feeling quite a bit better at this time.  Cough has diminished.  No fever.  Energy level has improved.  Appetite has maintained good oxygen saturations.    Review of Systems No headache, no major weight loss or weight gain, no chest pain no back pain abdominal pain no change in bowel habits complete ROS otherwise negative     Objective:   Physical Exam  Smiling no obvious distress on video      Assessment & Plan:  Impression COVID-19 with viral pneumonia.  Clinically much improved.  Warning signs discussed.  Convalescence discussed.  Questions answered

## 2018-12-10 ENCOUNTER — Other Ambulatory Visit: Payer: Self-pay | Admitting: Family Medicine

## 2018-12-10 ENCOUNTER — Telehealth: Payer: Self-pay | Admitting: Family Medicine

## 2018-12-10 MED ORDER — ZOLPIDEM TARTRATE 10 MG PO TABS: ORAL_TABLET | ORAL | 0 refills | Status: DC

## 2018-12-10 NOTE — Telephone Encounter (Signed)
1.  I will send in a 30-day supply only #2 send request to Dr. Richardson Landry regarding further instructions or refills of this medicine

## 2018-12-10 NOTE — Telephone Encounter (Signed)
Pt notified 30 day supply sent to [facility] and dr Richardson Landry will review message when he returns next week

## 2018-12-10 NOTE — Telephone Encounter (Signed)
Please advise. Thank you

## 2018-12-10 NOTE — Telephone Encounter (Signed)
Patient calling back again about her Ambien.  I told her that Dr. Richardson Landry isn't here and there is a 24-48 hour wait for prescriptions. She said she is out and can not sleep with out it.

## 2018-12-12 NOTE — Telephone Encounter (Signed)
Six mo worth 

## 2018-12-12 NOTE — Telephone Encounter (Signed)
As answered in phone mess six mo worth

## 2018-12-13 MED ORDER — ZOLPIDEM TARTRATE 10 MG PO TABS: ORAL_TABLET | ORAL | 5 refills | Status: DC

## 2018-12-13 NOTE — Telephone Encounter (Signed)
Additional prescription refills faxed to the pharmacy.

## 2018-12-17 ENCOUNTER — Other Ambulatory Visit: Payer: Self-pay

## 2018-12-17 ENCOUNTER — Ambulatory Visit (INDEPENDENT_AMBULATORY_CARE_PROVIDER_SITE_OTHER): Payer: Medicare Other | Admitting: Family Medicine

## 2018-12-17 ENCOUNTER — Encounter: Payer: Self-pay | Admitting: Family Medicine

## 2018-12-17 VITALS — BP 136/88 | Temp 97.1°F | Wt 128.2 lb

## 2018-12-17 DIAGNOSIS — U071 COVID-19: Secondary | ICD-10-CM

## 2018-12-17 MED ORDER — ZOLPIDEM TARTRATE 10 MG PO TABS: ORAL_TABLET | ORAL | 5 refills | Status: DC

## 2018-12-17 MED ORDER — LORAZEPAM 0.5 MG PO TABS: ORAL_TABLET | ORAL | 5 refills | Status: DC

## 2018-12-17 NOTE — Progress Notes (Signed)
   Subjective:    Patient ID: Katherine Lane, female    DOB: [birth year], 85 y.o.   MRN: [id number]  HPI Pt here today for follow up on pneumonia and COVID. Pt had COVID on 11/25/2018. Pt daughter states that she is still tired, poor appetite and weak.   Appetite  Has   diminshed   Pt feels sens in chest and wonders about the heart   fam worried about heart    notessome short winded sens with exertion    Oxy  Sat 96 per cent, doing well with the numvers  Patient arrives for a protracted discussion regarding her ongoing sensations post Covid notes shortness of breath at times.  Notes fatigue at times.  He has had a lingering cough overall improved.  Was concerned that her blood pressure was elevated.  Patient has been followed very closely in very attentive family by her cardiologist for her blood pressure.    Review of Systems No headache, no major weight loss or weight gain, no chest pain no back pain abdominal pain no change in bowel habits complete ROS otherwise negative     Objective:   Physical Exam  Alert and oriented, vitals reviewed and stable, NAD ENT-TM's and ext canals WNL bilat via otoscopic exam Soft palate, tonsils and post pharynx WNL via oropharyngeal exam Neck-symmetric, no masses; thyroid nonpalpable and nontender Pulmonary-no tachypnea or accessory muscle use; Clear without wheezes via auscultation Card--no abnrml murmurs, rhythm not reg and rate WNL Carotid pulses symmetric, without bruits       Assessment & Plan:  Impression 1 status post COVID-19.  Ongoing fatigue.  Ongoing diminished appetite.  Dietary supplement discussed.  Sense of shortness of breath at times.  Yet oxygen saturation fantastic.  I really think this is due to lack of physical activity.  Blood pressure improved on repeat.  I have advised family the patient's cardiologist has done an awesome job with her blood pressure and would recommend continued work with them.  At this time blood  pressure is good though not perfect  Greater than 50% of this 25 minute face to face visit was spent in counseling and discussion and coordination of care regarding the above diagnosis/diagnosies

## 2018-12-23 ENCOUNTER — Telehealth (INDEPENDENT_AMBULATORY_CARE_PROVIDER_SITE_OTHER): Payer: Medicare Other | Admitting: Student

## 2018-12-23 ENCOUNTER — Encounter: Payer: Self-pay | Admitting: Student

## 2018-12-23 VITALS — BP 130/90 | HR 104 | Ht 63.5 in | Wt 129.0 lb

## 2018-12-23 DIAGNOSIS — I4819 Other persistent atrial fibrillation: Secondary | ICD-10-CM

## 2018-12-23 DIAGNOSIS — E876 Hypokalemia: Secondary | ICD-10-CM

## 2018-12-23 DIAGNOSIS — I5032 Chronic diastolic (congestive) heart failure: Secondary | ICD-10-CM | POA: Diagnosis not present

## 2018-12-23 DIAGNOSIS — I10 Essential (primary) hypertension: Secondary | ICD-10-CM

## 2018-12-23 MED ORDER — METOPROLOL SUCCINATE ER 100 MG PO TB24: 100.0000 mg | ORAL_TABLET | Freq: Every day | ORAL | 11 refills | Status: DC

## 2018-12-23 NOTE — Patient Instructions (Signed)
Medication Instructions:  Your physician has recommended you make the following change in your medication:  Increase Toprol XL to 100 mg Daily   *If you need a refill on your cardiac medications before your next appointment, please call your pharmacy*  Lab Work: NONE  If you have labs (blood work) drawn today and your tests are completely normal, you will receive your results only by: Marland Kitchen MyChart Message (if you have MyChart) OR . A paper copy in the mail If you have any lab test that is abnormal or we need to change your treatment, we will call you to review the results.  Testing/Procedures: NONE   Follow-Up: At [facility], you and your health needs are our priority.  As part of our continuing mission to provide you with exceptional heart care, we have created designated Provider Care Teams.  These Care Teams include your primary Cardiologist (physician) and Advanced Practice Providers (APPs -  Physician Assistants and Nurse Practitioners) who all work together to provide you with the care you need, when you need it.  Your next appointment:   1 Week   The format for your next appointment:   In Person  Provider:   Bernerd Pho, PA-C  Other Instructions Thank you for choosing [facility]!

## 2018-12-23 NOTE — Progress Notes (Signed)
Virtual Visit via Telephone Note   This visit type was conducted due to national recommendations for restrictions regarding the COVID-19 Pandemic (e.g. social distancing) in an effort to limit this patient's exposure and mitigate transmission in our community.  Due to her co-morbid illnesses, this patient is at least at moderate risk for complications without adequate follow up.  This format is felt to be most appropriate for this patient at this time.  The patient did not have access to video technology/had technical difficulties with video requiring transitioning to audio format only (telephone).  All issues noted in this document were discussed and addressed.  No physical exam could be performed with this format.  Please refer to the patient's chart for her  consent to telehealth for [facility].   Date:  12/23/2018   ID:  Katherine Lane, DOB December 23, 1932, MRN [id number]  Patient Location: Home Provider Location: Office  PCP:  Merlyn Albert, MD  Cardiologist:  Prentice Docker, MD  Electrophysiologist:  None   Evaluation Performed:  Follow-Up Visit  Chief Complaint: "Jittery"  History of Present Illness:    Katherine Lane is a 85 y.o. female with past medical history of chronic diastolic CHF, paroxysmal atrial fibrillation, HTN, and chronic back pain who presents for a follow-up telehealth visit in regards to recent palpitations.  She was last examined by Dr. Purvis Sheffield in 09/2018 and reported having frequent palpitations and was having to take as needed Lopressor almost every day. She also felt that her appetite had decreased since being started on Hydralazine. Weight was stable at 133 lbs and she was continued on PRN Lasix given issues with hyponatremia with diuretic therapy in the past. Reported she was having to use her as needed Lopressor frequently, therefore Toprol-XL was increased from 50 mg daily to 75 mg daily. In regards to her blood pressure, she was continued on  Losartan 50 mg twice daily along with Hydralazine 50 mg QID.  By review of notes, she did test positive for COVID-19 early last month and was also diagnosed and treated for PNA. She was evaluated in the ED but did not require hospitalization.  In talking with the patient today, she feels like she has fully recovered from COVID-19 and says that her breathing has been at baseline. She denies any specific dyspnea on exertion, orthopnea or PND. No recent edema and her weight has overall been stable on her home scales.  This morning, she did check her blood pressure and was alarmed when it was elevated at 155/118.  Around that timeframe, she reported also feeling very jittery and that her heart was going fast. She rechecked vitals at the time of her phone visit and BP had improved to 130/90 after taking her AM medications but heart rate was elevated to 116. She still reports palpitations at this time but denies any chest pain.  Reports good compliance with Xarelto and denies any melena or hematochezia. She did experience a mild nosebleed earlier this morning which spontaneously resolved.  The patient does not have symptoms concerning for COVID-19 infection (fever, chills, cough, or new shortness of breath).    Past Medical History:  Diagnosis Date  . A-fib (HCC)   . Anxiety   . Arthritis   . CHF (congestive heart failure) (HCC)   . Chronic back pain   . Constipation   . Constipation, chronic   . Depression   . Family history of adverse reaction to anesthesia    Mother, Sister, daughter- N/V  .  History of blood transfusion    "pregnancy"  . HTN (hypertension)   . Insomnia   . Migraine headache   . Osteoporosis   . PONV (postoperative nausea and vomiting)    "violent"  . Pulmonary edema 06/2016  . Sodium (Na) deficiency   . Trigeminal neuralgia    Past Surgical History:  Procedure Laterality Date  . ANKLE FRACTURE SURGERY Right   . CARDIAC CATHETERIZATION     denies  . CATARACT  EXTRACTION W/PHACO Right 07/22/2012   Procedure: CATARACT EXTRACTION PHACO AND INTRAOCULAR LENS PLACEMENT (IOC);  Surgeon: Gemma PayorKerry Hunt, MD;  Location: [facility];  Service: Ophthalmology;  Laterality: Right;  CDE:  18.93  . CATARACT EXTRACTION W/PHACO Left 08/09/2012   Procedure: CATARACT EXTRACTION PHACO AND INTRAOCULAR LENS PLACEMENT (IOC);  Surgeon: Gemma PayorKerry Hunt, MD;  Location: [facility];  Service: Ophthalmology;  Laterality: Left;  CDE: 17.21  . COLONOSCOPY    . FLEXIBLE SIGMOIDOSCOPY N/A 11/19/2018   Procedure: FLEXIBLE SIGMOIDOSCOPY;  Surgeon: West BaliFields, Sandi L, MD;  Location: [facility];  Service: Endoscopy;  Laterality: N/A;  7:30am  . HARDWARE REMOVAL  07/18/2011   Procedure: HARDWARE REMOVAL; Right leg-  Surgeon: Vickki HearingStanley E Harrison, MD;  Location: [facility];  Service: Orthopedics;  Laterality: Right;  . HEMORRHOID BANDING N/A 11/19/2018   Procedure: HEMORRHOID BANDING;  Surgeon: West BaliFields, Sandi L, MD;  Location: [facility];  Service: Endoscopy;  Laterality: N/A;  . KYPHOPLASTY N/A 12/26/2016   Procedure: Lumbar two Lumbar three and Lumbar five KYPHOPLASTY;  Surgeon: Maeola HarmanStern, Joseph, MD;  [facility];  Service: Neurosurgery;  Laterality: N/A;  . KYPHOPLASTY N/A 02/19/2017   Procedure: KYPHOPLASTY LUMBAR ONE;  Surgeon: Maeola HarmanStern, Joseph, MD;  [facility];  Service: Neurosurgery;  Laterality: N/A;  KYPHOPLASTY LUMBAR ONE  . NERVE REPAIR  07/18/2011   Procedure: NERVE REPAIR;  Surgeon: Vickki HearingStanley E Harrison, MD;  Location: [facility];  Service: Orthopedics;  Laterality: Right;  Right leg superficial peroneal nerve release    . PARTIAL HYSTERECTOMY       Current Meds  Medication Sig  . clobetasol (TEMOVATE) 0.05 % external solution APPLY TO AFFECTED AREAS TWICE DAILY. (Patient taking differently: Apply 1 application topically 2 (two) times daily as needed (psoriasis). Marland Kitchen.)  . furosemide (LASIX) 20 MG tablet Take 20 mg by mouth daily as needed (weight gain/fluid retention.).   Marland Kitchen. hydrALAZINE (APRESOLINE) 50 MG tablet  TAKE (1) TABLET BY MOUTH (4) TIMES DAILY. (Patient taking differently: Take 50 mg by mouth 4 (four) times daily. )  . hydrocortisone (ANUSOL-HC) 2.5 % rectal cream APPLY RECTALLY THREE TIMES DAILY AS DIRECTED (Patient taking differently: Place 1 application rectally 2 (two) times daily. )  . LORazepam (ATIVAN) 0.5 MG tablet TAKE ONE TABLET BY MOUTH UP TO TWICE DAILY AS NEEDED.  Marland Kitchen. losartan (COZAAR) 50 MG tablet Take 50 mg  two times Daily at 7 AM and at 7 PM (Patient taking differently: Take 50 mg by mouth 2 (two) times daily. Daily at 7 AM and at 7 PM)  . Magnesium 250 MG TABS Take 250 mg by mouth at bedtime.   . Multiple Vitamin (MULTIVITAMIN WITH MINERALS) TABS tablet Take 1 tablet by mouth daily. Centrum Silver  . Multiple Vitamins-Minerals (PRESERVISION AREDS 2 PO) Take 1 tablet by mouth 2 (two) times daily.  . ondansetron (ZOFRAN) 4 MG tablet Take 1 tablet (4 mg total) by mouth every 8 (eight) hours as needed for nausea or vomiting.  Bertram Gala. Polyethyl Glycol-Propyl Glycol (LUBRICANT EYE DROPS)  0.4-0.3 % SOLN Place 1 drop into both eyes at bedtime.  . polyethylene glycol powder (MIRALAX) powder Take 17 g daily as needed by mouth for moderate constipation.   . potassium chloride (K-DUR) 10 MEQ tablet Take 10 mEq by mouth every other day. In the morning.  . shark liver oil-cocoa butter (PREPARATION H) 0.25-3-85.5 % suppository Place 1 suppository rectally 2 (two) times daily as needed for hemorrhoids.   Alveda Reasons 20 MG TABS tablet TAKE 1 TABLET DAILY WITH SUPPER. (Patient taking differently: Take 20 mg by mouth daily with supper. )  . zolpidem (AMBIEN) 10 MG tablet TAKE 1 TABLET BY MOUTH AT BEDTIME FOR SLEEP.  . [DISCONTINUED] doxycycline (VIBRAMYCIN) 100 MG capsule Take 1 capsule (100 mg total) by mouth 2 (two) times daily.  . [DISCONTINUED] metoprolol succinate (TOPROL XL) 50 MG 24 hr tablet Take 75 mg (1 1/2 tablets daily) Take with or immediately following a meal. (Patient taking differently: Take 75  mg by mouth daily after breakfast. Take 75 mg (1 1/2 tablets daily) Take with or immediately following a meal.)  . [DISCONTINUED] metoprolol tartrate (LOPRESSOR) 25 MG tablet Take 25 mg by mouth 2 (two) times daily as needed (palpitation.).   . [DISCONTINUED] predniSONE (DELTASONE) 20 MG tablet Take 3 po the first day then 2 po QD x 2d then 1 po QD x 3d     Allergies:   Amlodipine, Codeine, Other, and Tramadol   Social History   Tobacco Use  . Smoking status: Never Smoker  . Smokeless tobacco: Never Used  Substance Use Topics  . Alcohol use: No    Alcohol/week: 0.0 standard drinks  . Drug use: No     Family Hx: The patient's family history includes Alzheimer's disease in her mother; Cancer in her mother; Diabetes in her brother and daughter; Hypertension in her father and mother; Other in her brother and father. There is no history of Anesthesia problems, Hypotension, Malignant hyperthermia, or Pseudochol deficiency.  ROS:   Please see the history of present illness.     All other systems reviewed and are negative.   Prior CV studies:   The following studies were reviewed today:  Echocardiogram: 10/2017 Study Conclusions  - Left ventricle: The cavity size was normal. Wall thickness was   increased in a pattern of mild LVH. Systolic function was   vigorous. The estimated ejection fraction was in the range of 65%   to 70%. Wall motion was normal; there were no regional wall   motion abnormalities. The study was not technically sufficient to   allow evaluation of LV diastolic dysfunction due to atrial   fibrillation. Doppler parameters are consistent with high   ventricular filling pressure. - Aortic valve: Trileaflet; mildly thickened leaflets. There was   mild regurgitation. - Aorta: Mild ascending aortic dilatation. Diameter 4.03 cm. - Mitral valve: There was mild regurgitation. - Atrial septum: No defect or patent foramen ovale was identified. - Tricuspid valve: There  was mild regurgitation. - Pulmonary arteries: Systolic pressure was mildly to moderately   increased. PA peak pressure: 41 mm Hg (S).   Labs/Other Tests and Data Reviewed:    EKG:  An ECG dated 11/29/2018 was personally reviewed today and demonstrated: rate-controlled atrial fibrillation, HR 90 with nonspecific IVCD.   Recent Labs: 11/29/2018: ALT 20; BUN 14; Creatinine, Ser 0.73; Hemoglobin 13.4; Platelets 272; Potassium 3.3; Sodium 137   Recent Lipid Panel Lab Results  Component Value Date/Time   CHOL 177 09/24/2015 08:37 AM  TRIG 71 11/29/2018 04:38 AM   HDL 57 09/24/2015 08:37 AM   CHOLHDL 3.1 09/24/2015 08:37 AM   CHOLHDL 3.1 11/07/2013 08:07 AM   LDLCALC 93 09/24/2015 08:37 AM    Wt Readings from Last 3 Encounters:  12/23/18 129 lb (58.5 kg)  12/17/18 128 lb 3.2 oz (58.2 kg)  11/29/18 130 lb 12.8 oz (59.3 kg)     Objective:    Vital Signs:  BP 130/90   Pulse (!) 104   Ht 5' 3.5" (1.613 m)   Wt 129 lb (58.5 kg)   BMI 22.49 kg/m    General: Pleasant female sounding in NAD Psych: Normal affect. Neuro: Alert and oriented X 3. Lungs:  Resp regular and unlabored while talking on the phone.   ASSESSMENT & PLAN:    1. Chronic Diastolic CHF - She denies any recent dyspnea on exertion, orthopnea, PND or edema. Weight has overall been stable on her home scales. She is not on diuretic therapy at baseline given issues with hyponatremia in the past. Continue with close monitoring of sodium and fluid restriction.  2. Persistent Atrial Fibrillation - She reports feeling jittery and feels like her heart has been going fast this morning with heart rate ranging from 104 to 116 on her BP cuff when checked. She reports breathing has been at baseline.  - She is currently taking Toprol-XL 75 mg daily. Will further titrate to 100 mg daily. Will arrange for an in office visit next week where an EKG can be obtained if rates remain elevated. She was hypokalemic at 3.3 by recent labs on  11/29/2018 and would recommend checking a repeat BMET next week if rate remains elevated.  - Continue Xarelto for anticoagulation. Hemoglobin was stable at 13.4 by recent labs in 11/2018.  3. HTN - BP was elevated at 155/118 earlier this morning but improved to 130/90 on repeat check after checking her BP again. I recommended that she continue her current dosing of Losartan 50 mg daily and Hydralazine 50 mg QID. Will titrate Toprol-XL to  daily as outlined above. She feels like Hydralazine is causing her decreased appetite and I reviewed with her we need to get her blood pressure under stable control before we start weaning medications. Options in the future are limited given her intolerance to diuretics due to hyponatremia and intolerance to Amlodipine in the past.  4. Hypokalemia - K+ was low at 3.3 by recent labs in 11/2018.  She does take K-dur 10 mEq every other day.  Compliance with this was encouraged and will plan to obtain a repeat BMET next week.   COVID-19 Education: The signs and symptoms of COVID-19 were discussed with the patient and how to seek care for testing (follow up with PCP or arrange E-visit).  The importance of social distancing was discussed today.  Time:   Today, I have spent 24 minutes with the patient with telehealth technology discussing the above problems.     Medication Adjustments/Labs and Tests Ordered: Current medicines are reviewed at length with the patient today.  Concerns regarding medicines are outlined above.   Tests Ordered: No orders of the defined types were placed in this encounter.   Medication Changes: Meds ordered this encounter  Medications  . metoprolol succinate (TOPROL-XL) 100 MG 24 hr tablet    Sig: Take 1 tablet (100 mg total) by mouth daily.    Dispense:  30 tablet    Refill:  11    Follow Up: Will arrange for [place]  OFFICE visit next week.   Signed, Ellsworth Lennox, PA-C  12/23/2018 2:50 PM    [facility]

## 2018-12-30 ENCOUNTER — Other Ambulatory Visit: Payer: Self-pay

## 2018-12-30 ENCOUNTER — Encounter: Payer: Self-pay | Admitting: Cardiovascular Disease

## 2018-12-30 ENCOUNTER — Ambulatory Visit: Payer: Medicare Other | Admitting: Student

## 2018-12-30 ENCOUNTER — Ambulatory Visit (INDEPENDENT_AMBULATORY_CARE_PROVIDER_SITE_OTHER): Payer: Medicare Other | Admitting: Cardiovascular Disease

## 2018-12-30 VITALS — BP 150/78 | HR 103 | Ht 62.0 in | Wt 132.0 lb

## 2018-12-30 DIAGNOSIS — I10 Essential (primary) hypertension: Secondary | ICD-10-CM | POA: Diagnosis not present

## 2018-12-30 DIAGNOSIS — I4819 Other persistent atrial fibrillation: Secondary | ICD-10-CM

## 2018-12-30 DIAGNOSIS — E876 Hypokalemia: Secondary | ICD-10-CM | POA: Diagnosis not present

## 2018-12-30 DIAGNOSIS — I5032 Chronic diastolic (congestive) heart failure: Secondary | ICD-10-CM | POA: Diagnosis not present

## 2018-12-30 DIAGNOSIS — Z7901 Long term (current) use of anticoagulants: Secondary | ICD-10-CM

## 2018-12-30 NOTE — Progress Notes (Signed)
SUBJECTIVE: The patient presents routine follow-up.  She just had a telehealth visit on 12/23/2018.  She tested positive for COVID-19 in October and was also treated for pneumonia.  She is here with her daughter, [name], who also tested positive for COVID-19 and was hospitalized for at least 2 or 3 days.  Robin's husband also got Covid.  The patient is felt fatigued and slightly short of breath during this recovery period.  She thinks she may feel more fatigued after the increased dose of Toprol-XL.  She says that hydralazine has given her a diminished appetite.    Review of Systems: As per "subjective", otherwise negative.  Allergies  Allergen Reactions  . Amlodipine Swelling    Leg swelling   . Codeine Nausea And Vomiting and Other (See Comments)    Patient states "intolerance to all pain medications"  (NO OPIOIDS) VERY VIOLENT VOMITING!!  . Other Nausea And Vomiting and Other (See Comments)    general anesthesia  . Tramadol Nausea And Vomiting and Other (See Comments)    "NO OPIOIDS!!! VERY VIOLENT VOMITING!!" per Med History prior to 02/18/17    Current Outpatient Medications  Medication Sig Dispense Refill  . clobetasol (TEMOVATE) 0.05 % external solution APPLY TO AFFECTED AREAS TWICE DAILY. (Patient taking differently: Apply 1 application topically 2 (two) times daily as needed (psoriasis). Marland Kitchen) 50 mL 3  . furosemide (LASIX) 20 MG tablet Take 20 mg by mouth daily as needed (weight gain/fluid retention.).     Marland Kitchen hydrALAZINE (APRESOLINE) 50 MG tablet TAKE (1) TABLET BY MOUTH (4) TIMES DAILY. (Patient taking differently: Take 50 mg by mouth 4 (four) times daily. ) 120 tablet 6  . hydrocortisone (ANUSOL-HC) 2.5 % rectal cream APPLY RECTALLY THREE TIMES DAILY AS DIRECTED (Patient taking differently: Place 1 application rectally 2 (two) times daily. ) 30 g 5  . LORazepam (ATIVAN) 0.5 MG tablet TAKE ONE TABLET BY MOUTH UP TO TWICE DAILY AS NEEDED. 60 tablet 5  . losartan (COZAAR) 50  MG tablet Take 50 mg  two times Daily at 7 AM and at 7 PM (Patient taking differently: Take 50 mg by mouth 2 (two) times daily. Daily at 7 AM and at 7 PM) 90 tablet 3  . Magnesium 250 MG TABS Take 250 mg by mouth at bedtime.     . metoprolol succinate (TOPROL-XL) 100 MG 24 hr tablet Take 1 tablet (100 mg total) by mouth daily. 30 tablet 11  . Multiple Vitamin (MULTIVITAMIN WITH MINERALS) TABS tablet Take 1 tablet by mouth daily. Centrum Silver    . Multiple Vitamins-Minerals (PRESERVISION AREDS 2 PO) Take 1 tablet by mouth 2 (two) times daily.    . ondansetron (ZOFRAN) 4 MG tablet Take 1 tablet (4 mg total) by mouth every 8 (eight) hours as needed for nausea or vomiting. 30 tablet 2  . Polyethyl Glycol-Propyl Glycol (LUBRICANT EYE DROPS) 0.4-0.3 % SOLN Place 1 drop into both eyes at bedtime.    . polyethylene glycol powder (MIRALAX) powder Take 17 g daily as needed by mouth for moderate constipation.     . potassium chloride (K-DUR) 10 MEQ tablet Take 10 mEq by mouth every other day. In the morning.    . shark liver oil-cocoa butter (PREPARATION H) 0.25-3-85.5 % suppository Place 1 suppository rectally 2 (two) times daily as needed for hemorrhoids.     Carlena Hurl 20 MG TABS tablet TAKE 1 TABLET DAILY WITH SUPPER. (Patient taking differently: Take 20 mg by mouth daily  with supper. ) 90 tablet 3  . zolpidem (AMBIEN) 10 MG tablet TAKE 1 TABLET BY MOUTH AT BEDTIME FOR SLEEP. 30 tablet 5   No current facility-administered medications for this visit.     Past Medical History:  Diagnosis Date  . A-fib (HCC)   . Anxiety   . Arthritis   . CHF (congestive heart failure) (HCC)   . Chronic back pain   . Constipation   . Constipation, chronic   . Depression   . Family history of adverse reaction to anesthesia    Mother, Sister, daughter- N/V  . History of blood transfusion    "pregnancy"  . HTN (hypertension)   . Insomnia   . Migraine headache   . Osteoporosis   . PONV (postoperative nausea and  vomiting)    "violent"  . Pulmonary edema 06/2016  . Sodium (Na) deficiency   . Trigeminal neuralgia     Past Surgical History:  Procedure Laterality Date  . ANKLE FRACTURE SURGERY Right   . CARDIAC CATHETERIZATION     denies  . CATARACT EXTRACTION W/PHACO Right 07/22/2012   Procedure: CATARACT EXTRACTION PHACO AND INTRAOCULAR LENS PLACEMENT (IOC);  Surgeon: Gemma PayorKerry Hunt, MD;  Location: [facility];  Service: Ophthalmology;  Laterality: Right;  CDE:  18.93  . CATARACT EXTRACTION W/PHACO Left 08/09/2012   Procedure: CATARACT EXTRACTION PHACO AND INTRAOCULAR LENS PLACEMENT (IOC);  Surgeon: Gemma PayorKerry Hunt, MD;  Location: [facility];  Service: Ophthalmology;  Laterality: Left;  CDE: 17.21  . COLONOSCOPY    . FLEXIBLE SIGMOIDOSCOPY N/A 11/19/2018   Procedure: FLEXIBLE SIGMOIDOSCOPY;  Surgeon: West BaliFields, Sandi L, MD;  Location: [facility];  Service: Endoscopy;  Laterality: N/A;  7:30am  . HARDWARE REMOVAL  07/18/2011   Procedure: HARDWARE REMOVAL; Right leg-  Surgeon: Vickki HearingStanley E Harrison, MD;  Location: [facility];  Service: Orthopedics;  Laterality: Right;  . HEMORRHOID BANDING N/A 11/19/2018   Procedure: HEMORRHOID BANDING;  Surgeon: West BaliFields, Sandi L, MD;  Location: [facility];  Service: Endoscopy;  Laterality: N/A;  . KYPHOPLASTY N/A 12/26/2016   Procedure: Lumbar two Lumbar three and Lumbar five KYPHOPLASTY;  Surgeon: Maeola HarmanStern, Joseph, MD;  [facility];  Service: Neurosurgery;  Laterality: N/A;  . KYPHOPLASTY N/A 02/19/2017   Procedure: KYPHOPLASTY LUMBAR ONE;  Surgeon: Maeola HarmanStern, Joseph, MD;  [facility];  Service: Neurosurgery;  Laterality: N/A;  KYPHOPLASTY LUMBAR ONE  . NERVE REPAIR  07/18/2011   Procedure: NERVE REPAIR;  Surgeon: Vickki HearingStanley E Harrison, MD;  Location: [facility];  Service: Orthopedics;  Laterality: Right;  Right leg superficial peroneal nerve release    . PARTIAL HYSTERECTOMY      Social History   Socioeconomic History  . Marital status: Widowed    Spouse name: Not on file  . Number of  children: Not on file  . Years of education: Not on file  . Highest education level: Not on file  Occupational History  . Occupation: retired    Associate Professormployer: RETIRED  Social Needs  . Financial resource strain: Not on file  . Food insecurity    Worry: Not on file    Inability: Not on file  . Transportation needs    Medical: Not on file    Non-medical: Not on file  Tobacco Use  . Smoking status: Never Smoker  . Smokeless tobacco: Never Used  Substance and Sexual Activity  . Alcohol use: No    Alcohol/week: 0.0 standard drinks  . Drug use: No  . Sexual activity: Not Currently  Birth control/protection: Surgical    Comment: hyst  Lifestyle  . Physical activity    Days per week: Not on file    Minutes per session: Not on file  . Stress: Not on file  Relationships  . Social Herbalist on phone: Not on file    Gets together: Not on file    Attends religious service: Not on file    Active member of club or organization: Not on file    Attends meetings of clubs or organizations: Not on file    Relationship status: Not on file  . Intimate partner violence    Fear of current or ex partner: Not on file    Emotionally abused: Not on file    Physically abused: Not on file    Forced sexual activity: Not on file  Other Topics Concern  . Not on file  Social History Narrative  . Not on file     Vitals:   12/30/18 1358  BP: (!) 150/78  Pulse: (!) 103  SpO2: 98%  Weight: 132 lb (59.9 kg)  Height: 5\' 2"  (1.575 m)    Wt Readings from Last 3 Encounters:  12/30/18 132 lb (59.9 kg)  12/23/18 129 lb (58.5 kg)  12/17/18 128 lb 3.2 oz (58.2 kg)     PHYSICAL EXAM General: NAD HEENT: Normal. Neck: No JVD, no thyromegaly. Lungs: Clear to auscultation bilaterally with normal respiratory effort. CV: Regular rate and irregular rhythm, normal S1/S2, no S3, no murmur. No pretibial or periankle edema.   Abdomen: Soft, nontender, no distention.  Neurologic: Alert and  oriented.  Psych: Normal affect. Skin: Normal. Musculoskeletal: No gross deformities.      Labs: Lab Results  Component Value Date/Time   K 3.3 (L) 11/29/2018 04:38 AM   BUN 14 11/29/2018 04:38 AM   BUN 13 10/14/2017 12:50 PM   CREATININE 0.73 11/29/2018 04:38 AM   CREATININE 0.90 10/18/2014 10:38 AM   ALT 20 11/29/2018 04:38 AM   TSH 2.490 10/14/2017 12:50 PM   HGB 13.4 11/29/2018 04:38 AM   HGB 14.1 10/14/2017 12:50 PM     Lipids: Lab Results  Component Value Date/Time   LDLCALC 93 09/24/2015 08:37 AM   CHOL 177 09/24/2015 08:37 AM   TRIG 71 11/29/2018 04:38 AM   HDL 57 09/24/2015 08:37 AM       ASSESSMENT AND PLAN: 1.  Persistent atrial fibrillation: By physical exam heart rate in the high 80 bpm range.  Toprol-XL recently titrated to 100 mg daily on 12/23/2018.  I will check a basic metabolic panel and magnesium level.  Continue Xarelto for anticoagulation.  2.  Chronic diastolic heart failure: She is not on diuretic therapy given issues with hyponatremia in the past.  She has been educated on close monitoring of sodium and fluid restriction.  3.  Hypertension: Blood pressure is elevated today.  Toprol-XL was recently increased to 100 mg daily.  She has been intolerant of several other medications.  4.  Hypokalemia: I will check a basic metabolic panel.    Disposition: Follow up 3 months (virtual)   Kate Sable, M.D., F.A.C.C.

## 2018-12-30 NOTE — Patient Instructions (Signed)
Medication Instructions:  Continue all current medications.  Labwork:  BMET, Magnesium - orders given today.   Office will contact with results via phone or letter.    Testing/Procedures: none  Follow-Up: 3 months   Any Other Special Instructions Will Be Listed Below (If Applicable).  If you need a refill on your cardiac medications before your next appointment, please call your pharmacy.

## 2019-01-01 LAB — BASIC METABOLIC PANEL
BUN: 14 mg/dL (ref 7–25)
CO2: 28 mmol/L (ref 20–32)
Calcium: 9.5 mg/dL (ref 8.6–10.4)
Chloride: 100 mmol/L (ref 98–110)
Creat: 0.82 mg/dL (ref 0.60–0.88)
Glucose, Bld: 148 mg/dL — ABNORMAL HIGH (ref 65–139)
Potassium: 4 mmol/L (ref 3.5–5.3)
Sodium: 137 mmol/L (ref 135–146)

## 2019-01-01 LAB — MAGNESIUM: Magnesium: 2.3 mg/dL (ref 1.5–2.5)

## 2019-01-05 ENCOUNTER — Telehealth: Payer: Self-pay | Admitting: *Deleted

## 2019-01-05 NOTE — Telephone Encounter (Signed)
Notes recorded by Laurine Blazer, LPN on 36/01/2448 at 11:41 AM EST  Patient notified. Copy to pmd. Follow up scheduled for 03/28/2019.    ------   Notes recorded by Herminio Commons, MD on 01/01/2019 at 9:04 AM EST  Normal renal function, sodium, k, and mag.

## 2019-01-31 ENCOUNTER — Other Ambulatory Visit: Payer: Self-pay | Admitting: Cardiovascular Disease

## 2019-02-23 ENCOUNTER — Telehealth: Payer: Self-pay | Admitting: Family Medicine

## 2019-02-23 MED ORDER — CEFPROZIL 500 MG PO TABS: ORAL_TABLET | ORAL | 0 refills | Status: DC

## 2019-02-23 NOTE — Telephone Encounter (Signed)
Cefzil 500 bid ten d 

## 2019-02-23 NOTE — Telephone Encounter (Signed)
Right ear full of fluid no pain, but not able to hear out of it. She would like something called in.   [facility] - [place], [place] - [address]

## 2019-02-23 NOTE — Telephone Encounter (Signed)
Medication sent to pharmacy and pt is aware 

## 2019-02-23 NOTE — Telephone Encounter (Signed)
Please advise. Thank you

## 2019-02-24 ENCOUNTER — Other Ambulatory Visit: Payer: Self-pay | Admitting: Cardiovascular Disease

## 2019-02-24 ENCOUNTER — Telehealth: Payer: Self-pay | Admitting: Cardiovascular Disease

## 2019-02-24 NOTE — Telephone Encounter (Signed)
Pt left message stating she's needing a refill on her metoprolol succinate (TOPROL-XL) 100 MG 24 hr tablet [[id number]]  -- stated it was denied refill today

## 2019-02-24 NOTE — Telephone Encounter (Signed)
Patient reports she has had a prescription for Metoprolol Tartrate 25mg  that she was instructed a couple of years ago to take one tab daily as needed for palpitations.  She reports she goes through periods where she has doesn't need it and then periods where she might need it a couple of time per week.  Pt is requesting refills on this.  Noted that the only Metoprolol on record for her is Succinate 100mg , pt reports that the prescribing MD on the Metoprolol was hospital MD from 2 years ago.

## 2019-02-25 MED ORDER — METOPROLOL TARTRATE 25 MG PO TABS: 25.0000 mg | ORAL_TABLET | Freq: Every day | ORAL | 1 refills | Status: DC | PRN

## 2019-02-25 MED ORDER — METOPROLOL SUCCINATE ER 100 MG PO TB24: 100.0000 mg | ORAL_TABLET | Freq: Every day | ORAL | 11 refills | Status: DC

## 2019-02-25 NOTE — Telephone Encounter (Signed)
Spoke with pt and advised rx for metoprolol tartrate PRN being sent, pt also request reorder for 100mg  succinate as the pharmacy does not seem to have this current dosage.

## 2019-02-25 NOTE — Telephone Encounter (Signed)
Yes, that would be fine.

## 2019-03-07 ENCOUNTER — Other Ambulatory Visit: Payer: Self-pay

## 2019-03-07 ENCOUNTER — Encounter: Payer: Self-pay | Admitting: Family Medicine

## 2019-03-07 ENCOUNTER — Ambulatory Visit: Payer: Medicare PPO | Admitting: Family Medicine

## 2019-03-07 VITALS — BP 140/86 | Temp 97.0°F | Wt 128.2 lb

## 2019-03-07 DIAGNOSIS — H6501 Acute serous otitis media, right ear: Secondary | ICD-10-CM | POA: Diagnosis not present

## 2019-03-07 DIAGNOSIS — I10 Essential (primary) hypertension: Secondary | ICD-10-CM | POA: Diagnosis not present

## 2019-03-07 MED ORDER — AMOXICILLIN-POT CLAVULANATE 875-125 MG PO TABS: ORAL_TABLET | ORAL | 0 refills | Status: DC

## 2019-03-07 NOTE — Progress Notes (Signed)
   Subjective:    Patient ID: Katherine Lane, female    DOB: 07-09-1932, 86 y.o.   MRN: [id number]  HPI    Review of Systems     Objective:   Physical Exam        Assessment & Plan:

## 2019-03-07 NOTE — Progress Notes (Signed)
   Subjective:    Patient ID: Katherine Lane, female    DOB: 07-24-1932, 86 y.o.   MRN: [id number]  HPI Pt here today for ear infection. Pt can feel fluid in right ear but no drainage from ear. Pt has been on Cefprozil since the 6th and that has not helped any. Pt has put sweet oil in ear but that felt like it made things worse.   Patient also concerned about palpitations she is experiencing in her chest. Compliant with medications. Added an extra Toprol for the palpitations today. Has been using a few more diuretic tablets than usual also because of perceived issues with fluid. No swelling in the ankles Review of Systems See above    Objective:   Physical Exam  Alert vitals stable, NAD. Blood pressure good on repeat. HEENT see below otherwise normal. Lungs clear. Heart regular rate and rhythm. Ankles trace edema at most. Distinct right otitis media      Assessment & Plan:  Impression 1 otitis media discussed #2 palpitations. Patient concerned about these. Will check magnesium and Potassium.  3. Hypertension blood pressure good control today  Antibiotics prescribed symptom care discussed expect slow resolution of fluid in ears

## 2019-03-08 LAB — BASIC METABOLIC PANEL
BUN/Creatinine Ratio: 16 (ref 12–28)
BUN: 12 mg/dL (ref 8–27)
CO2: 24 mmol/L (ref 20–29)
Calcium: 10 mg/dL (ref 8.7–10.3)
Chloride: 101 mmol/L (ref 96–106)
Creatinine, Ser: 0.74 mg/dL (ref 0.57–1.00)
GFR calc Af Amer: 86 mL/min/{1.73_m2} (ref >59)
GFR calc non Af Amer: 75 mL/min/{1.73_m2} (ref >59)
Glucose: 144 mg/dL — ABNORMAL HIGH (ref 65–99)
Potassium: 4.2 mmol/L (ref 3.5–5.2)
Sodium: 137 mmol/L (ref 134–144)

## 2019-03-08 LAB — MAGNESIUM: Magnesium: 2.1 mg/dL (ref 1.6–2.3)

## 2019-03-09 ENCOUNTER — Other Ambulatory Visit: Payer: Self-pay | Admitting: Cardiovascular Disease

## 2019-03-17 ENCOUNTER — Telehealth: Payer: Self-pay | Admitting: Family Medicine

## 2019-03-17 NOTE — Telephone Encounter (Signed)
Pt called office regarding ear pain. Spoke with provider and provider states office visit tomorrow afternoon (beginning of afternoon). Pt would like to know what she can take for pain. Has been taking Tylenol ever 5-6 hours but that has not been helping. Pt states she was told by cardiologist to only take Tylenol. Pt is would like suggestions on what to take to help with pain until her visit tomorrow. Please advise. Thank you  [facility]

## 2019-03-17 NOTE — Telephone Encounter (Signed)
Recommend we eval pt first before discussing more stronger forms of pain control which can cause substantial side effects

## 2019-03-18 ENCOUNTER — Telehealth: Payer: Self-pay | Admitting: *Deleted

## 2019-03-18 ENCOUNTER — Other Ambulatory Visit: Payer: Self-pay

## 2019-03-18 ENCOUNTER — Other Ambulatory Visit: Payer: Self-pay | Admitting: *Deleted

## 2019-03-18 ENCOUNTER — Other Ambulatory Visit (HOSPITAL_COMMUNITY)
Admission: RE | Admit: 2019-03-18 | Discharge: 2019-03-18 | Disposition: A | Discharge status: Home / Self Care | Payer: Medicare PPO | Source: Ambulatory Visit | Attending: Family Medicine | Admitting: Family Medicine

## 2019-03-18 ENCOUNTER — Encounter: Payer: Self-pay | Admitting: Family Medicine

## 2019-03-18 ENCOUNTER — Ambulatory Visit: Payer: Medicare PPO | Admitting: Family Medicine

## 2019-03-18 VITALS — BP 132/88 | Temp 98.1°F | Ht 62.0 in | Wt 124.8 lb

## 2019-03-18 DIAGNOSIS — G4489 Other headache syndrome: Secondary | ICD-10-CM

## 2019-03-18 DIAGNOSIS — H9202 Otalgia, left ear: Secondary | ICD-10-CM

## 2019-03-18 DIAGNOSIS — R519 Headache, unspecified: Secondary | ICD-10-CM | POA: Diagnosis not present

## 2019-03-18 DIAGNOSIS — F419 Anxiety disorder, unspecified: Secondary | ICD-10-CM

## 2019-03-18 DIAGNOSIS — H9209 Otalgia, unspecified ear: Secondary | ICD-10-CM

## 2019-03-18 DIAGNOSIS — I10 Essential (primary) hypertension: Secondary | ICD-10-CM | POA: Diagnosis not present

## 2019-03-18 LAB — SEDIMENTATION RATE: Sed Rate: 3 mm/hr (ref 0–22)

## 2019-03-18 MED ORDER — ONDANSETRON HCL 4 MG PO TABS: 4.0000 mg | ORAL_TABLET | Freq: Three times a day (TID) | ORAL | 2 refills | Status: DC | PRN

## 2019-03-18 MED ORDER — HYDROMORPHONE HCL 2 MG PO TABS: ORAL_TABLET | ORAL | 0 refills | Status: DC

## 2019-03-18 NOTE — Telephone Encounter (Signed)
Pt contacted and verbalized understanding. Pt has appt today at 1:10 and is aware that we would need to evaluate her first. Pt states as long as she does not move or turn head, she can stand the pain. Has not slept much.

## 2019-03-18 NOTE — Telephone Encounter (Signed)
Called pt's daughter [name] and gave stat bw results per dr Brett Canales. Zero chance of temporal arteritis and pain is from neuropathic headache. Treatment is pain med which pt got at visit today. Daughter verbalized understanding  And she wanted to know if she could get refill on zofran to [facility] since the pain med does make her sick.

## 2019-03-18 NOTE — Telephone Encounter (Signed)
Yes plus two ref

## 2019-03-18 NOTE — Telephone Encounter (Signed)
Refills sent to pharm and robin notified.

## 2019-03-18 NOTE — Progress Notes (Signed)
   Subjective:    Patient ID: Katherine Lane, female    DOB: Jul 11, 1932, 86 y.o.   MRN: [id number]  Otalgia  There is pain in the left ear. This is a new problem. Episode onset: a few weeks. Associated symptoms include ear discharge. Associated symptoms comments: Facial pain on left side, some pain in right ear also. She has tried acetaminophen (2 antibiotics) for the symptoms.   bp was 155/107 and daughter states mother took another one of her bp meds.    Patient presents describing fairly severe pain on the left side of the face.  Of note was being treated for otitis media.  Has finished 2 rounds of antibiotics.  Still noticing fullness and diminished hearing on the right side.  Her pain is on the left side.  Patient reports seeing blood from that side of the ear.  No fever no chills.  Pain is sharp lancinating very severe at times.  Review of Systems  HENT: Positive for ear discharge and ear pain.        Objective:   Physical Exam  Alert active clearly in pain grimacing with sharp shooting pains left side temporal periauricular region right TM effusion still present pharynx normal blood pressure improved on repeat lungs clear heart irregular rhythm but good control.  Lateral scalp no hypersensitivity no obvious tenderness temporal artery on left side or right side for that matter no adenopathy      Assessment & Plan:  Impression probable neuropathic headaches.  However with patient's age and severity need to assess sedimentation rate.  Stat sed rate ordered.  Please return very well at 3 which is reassuring.  Pain really is not on the temporal arteritis description more of a sudden neuropathic sharp lancinating pain left-sided scalp.  Dilaudid prescribed patient apparently allergic to all other medications  2.  Persistent effusion right ear.  This worries the patient.  We will work on ENT referral  3.  Hypertension blood pressure improved on repeat  4.  Anxiety ongoing and  substantial.  As per orders  Greater than 50% of this 30 minute face to face visit was spent in counseling and discussion and coordination of care regarding the above diagnosis/diagnosies

## 2019-03-22 ENCOUNTER — Telehealth: Payer: Self-pay | Admitting: Family Medicine

## 2019-03-22 ENCOUNTER — Other Ambulatory Visit: Payer: Self-pay | Admitting: *Deleted

## 2019-03-22 ENCOUNTER — Other Ambulatory Visit: Payer: Self-pay | Admitting: Cardiovascular Disease

## 2019-03-22 MED ORDER — CIPROFLOXACIN-DEXAMETHASONE 0.3-0.1 % OT SUSP: OTIC | 0 refills | Status: DC

## 2019-03-22 NOTE — Telephone Encounter (Signed)
Refill ciprodex, ck on ent ref status need to make sure we have done our part

## 2019-03-22 NOTE — Telephone Encounter (Signed)
Ciprodex sent to [facility]. Left message to return call to discuss with robin.

## 2019-03-22 NOTE — Telephone Encounter (Signed)
Daughter notified ciprodex was sent to pharm and that the referral was being worked on. She wants to see dr Suszanne Conners and said to call her mother directly at her home number in chart to let her know of the appt

## 2019-03-22 NOTE — Telephone Encounter (Signed)
Daughter would like ear drops called in. Said she has a bottle from 2018 called Citrodex otic susp. 7.5, use in ear twice a day.  Also checking on ENT referral to Dr. Suszanne Conners.   [facility]

## 2019-03-24 ENCOUNTER — Encounter: Payer: Self-pay | Admitting: Family Medicine

## 2019-03-25 NOTE — Telephone Encounter (Signed)
Referral sent to Dr. Suszanne Conners, letter mailed to pt & sent MyChart message sent to pt to notify that referral was sent

## 2019-03-28 ENCOUNTER — Encounter: Payer: Self-pay | Admitting: Cardiovascular Disease

## 2019-03-28 ENCOUNTER — Encounter: Payer: Self-pay | Admitting: Gastroenterology

## 2019-03-28 ENCOUNTER — Telehealth (INDEPENDENT_AMBULATORY_CARE_PROVIDER_SITE_OTHER): Payer: Medicare PPO | Admitting: Cardiovascular Disease

## 2019-03-28 VITALS — BP 144/98 | HR 70 | Ht 62.0 in | Wt 124.0 lb

## 2019-03-28 DIAGNOSIS — E876 Hypokalemia: Secondary | ICD-10-CM

## 2019-03-28 DIAGNOSIS — I4819 Other persistent atrial fibrillation: Secondary | ICD-10-CM

## 2019-03-28 DIAGNOSIS — I10 Essential (primary) hypertension: Secondary | ICD-10-CM

## 2019-03-28 DIAGNOSIS — Z79899 Other long term (current) drug therapy: Secondary | ICD-10-CM

## 2019-03-28 DIAGNOSIS — Z7901 Long term (current) use of anticoagulants: Secondary | ICD-10-CM

## 2019-03-28 DIAGNOSIS — I5032 Chronic diastolic (congestive) heart failure: Secondary | ICD-10-CM

## 2019-03-28 MED ORDER — CLONIDINE HCL 0.1 MG PO TABS: 0.1000 mg | ORAL_TABLET | Freq: Every evening | ORAL | 6 refills | Status: DC

## 2019-03-28 NOTE — Patient Instructions (Signed)
Medication Instructions:   Begin Clonidine 0.1mg  every evening at 8:00 pm.  Continue all other medications.    Labwork: none  Testing/Procedures: none  Follow-Up: Your physician wants you to follow up in: 6 months.  You will receive a reminder letter in the mail one-two months in advance.  If you don't receive a letter, please call our office to schedule the follow up appointment   Any Other Special Instructions Will Be Listed Below (If Applicable).  If you need a refill on your cardiac medications before your next appointment, please call your pharmacy.

## 2019-03-28 NOTE — Progress Notes (Signed)
Virtual Visit via Telephone Note   This visit type was conducted due to national recommendations for restrictions regarding the COVID-19 Pandemic (e.g. social distancing) in an effort to limit this patient's exposure and mitigate transmission in our community.  Due to her co-morbid illnesses, this patient is at least at moderate risk for complications without adequate follow up.  This format is felt to be most appropriate for this patient at this time.  The patient did not have access to video technology/had technical difficulties with video requiring transitioning to audio format only (telephone).  All issues noted in this document were discussed and addressed.  No physical exam could be performed with this format.  Please refer to the patient's chart for her  consent to telehealth for [facility].   Date:  03/28/2019   ID:  Katherine Lane, DOB 06/01/1932, MRN [id number]  Patient Location: Home Provider Location: Office  PCP:  Mikey Kirschner, MD  Cardiologist:  Kate Sable, MD  Electrophysiologist:  None   Evaluation Performed:  Follow-Up Visit  Chief Complaint:  HTN, atrial fibrillation  History of Present Illness:    Katherine Lane is a 86 y.o. female with chronic diastolic CHF, persistent atrial fibrillation, HTN, generalized anxiety disorder, and chronic back pain  I reviewed PCP notes dated 03/18/2019.  She has been experiencing neuropathic headaches and a persistent effusion in her right ear.  This past Saturday at 6 pm, BP was 160/107.    She takes losartan at 7 pm and hydralazine at 10:30 pm when she goes to bed.  She received the first dose of the COVID vaccine and is scheduled to receive the second dose on 2/19.  We talked about some additional antihypertensive strategies at length.  Past Medical History:  Diagnosis Date  . A-fib ([place])   . Anxiety   . Arthritis   . CHF (congestive heart failure) ([place])   . Chronic back pain   . Constipation   .  Constipation, chronic   . Depression   . Family history of adverse reaction to anesthesia    Mother, Sister, daughter- N/V  . History of blood transfusion    "pregnancy"  . HTN (hypertension)   . Insomnia   . Migraine headache   . Osteoporosis   . PONV (postoperative nausea and vomiting)    "violent"  . Pulmonary edema 06/2016  . Sodium (Na) deficiency   . Trigeminal neuralgia    Past Surgical History:  Procedure Laterality Date  . ANKLE FRACTURE SURGERY Right   . CARDIAC CATHETERIZATION     denies  . CATARACT EXTRACTION W/PHACO Right 07/22/2012   Procedure: CATARACT EXTRACTION PHACO AND INTRAOCULAR LENS PLACEMENT (IOC);  Surgeon: Tonny Branch, MD;  Location: [facility];  Service: Ophthalmology;  Laterality: Right;  CDE:  18.93  . CATARACT EXTRACTION W/PHACO Left 08/09/2012   Procedure: CATARACT EXTRACTION PHACO AND INTRAOCULAR LENS PLACEMENT (IOC);  Surgeon: Tonny Branch, MD;  Location: [facility];  Service: Ophthalmology;  Laterality: Left;  CDE: 17.21  . COLONOSCOPY    . FLEXIBLE SIGMOIDOSCOPY N/A 11/19/2018   Procedure: FLEXIBLE SIGMOIDOSCOPY;  Surgeon: Danie Binder, MD;  Location: [facility];  Service: Endoscopy;  Laterality: N/A;  7:30am  . HARDWARE REMOVAL  07/18/2011   Procedure: HARDWARE REMOVAL; Right leg-  Surgeon: Carole Civil, MD;  Location: [facility];  Service: Orthopedics;  Laterality: Right;  . HEMORRHOID BANDING N/A 11/19/2018   Procedure: HEMORRHOID BANDING;  Surgeon: Danie Binder, MD;  Location: [facility];  Service: Endoscopy;  Laterality: N/A;  . KYPHOPLASTY N/A 12/26/2016   Procedure: Lumbar two Lumbar three and Lumbar five KYPHOPLASTY;  Surgeon: Maeola Harman, MD;  [facility]: [facility] OR;  Service: Neurosurgery;  Laterality: N/A;  . KYPHOPLASTY N/A 02/19/2017   Procedure: KYPHOPLASTY LUMBAR ONE;  Surgeon: Maeola Harman, MD;  Location: [facility] OR;  Service: Neurosurgery;  Laterality: N/A;  KYPHOPLASTY LUMBAR ONE  . NERVE REPAIR  07/18/2011   Procedure: NERVE REPAIR;  Surgeon:  Vickki Hearing, MD;  Location: [facility];  Service: Orthopedics;  Laterality: Right;  Right leg superficial peroneal nerve release    . PARTIAL HYSTERECTOMY       Current Meds  Medication Sig  . ciprofloxacin-dexamethasone (CIPRODEX) OTIC suspension Place 4 drops into affected ear bid  . clobetasol (TEMOVATE) 0.05 % external solution APPLY TO AFFECTED AREAS TWICE DAILY. (Patient taking differently: Apply 1 application topically 2 (two) times daily as needed (psoriasis). Marland Kitchen)  . furosemide (LASIX) 20 MG tablet Take 20 mg by mouth daily as needed (weight gain/fluid retention.).   Marland Kitchen hydrALAZINE (APRESOLINE) 50 MG tablet TAKE (1) TABLET BY MOUTH (4) TIMES DAILY.  . hydrocortisone (ANUSOL-HC) 2.5 % rectal cream APPLY RECTALLY THREE TIMES DAILY AS DIRECTED (Patient taking differently: Place 1 application rectally 2 (two) times daily. )  . HYDROmorphone (DILAUDID) 2 MG tablet Take one half to one tablet po every 6 - 8 hours prn pain  . LORazepam (ATIVAN) 0.5 MG tablet TAKE ONE TABLET BY MOUTH UP TO TWICE DAILY AS NEEDED.  Marland Kitchen losartan (COZAAR) 50 MG tablet Take 50 mg  two times Daily at 7 AM and at 7 PM (Patient taking differently: Take 50 mg by mouth 2 (two) times daily. Daily at 7 AM and at 7 PM)  . Magnesium 250 MG TABS Take 250 mg by mouth at bedtime.   . metoprolol succinate (TOPROL-XL) 100 MG 24 hr tablet Take 1 tablet (100 mg total) by mouth daily.  . metoprolol tartrate (LOPRESSOR) 25 MG tablet Take 1 tablet (25 mg total) by mouth daily as needed (palpitations).  . Multiple Vitamin (MULTIVITAMIN WITH MINERALS) TABS tablet Take 1 tablet by mouth daily. Centrum Silver  . Multiple Vitamins-Minerals (PRESERVISION AREDS 2 PO) Take 1 tablet by mouth 2 (two) times daily.  . ondansetron (ZOFRAN) 4 MG tablet Take 1 tablet (4 mg total) by mouth every 8 (eight) hours as needed for nausea or vomiting.  Bertram Gala Glycol-Propyl Glycol (LUBRICANT EYE DROPS) 0.4-0.3 % SOLN Place 1 drop into both eyes at bedtime.   . polyethylene glycol powder (MIRALAX) powder Take 17 g daily as needed by mouth for moderate constipation.   . potassium chloride (KLOR-CON) 10 MEQ tablet TAKE 1 TABLET EVERY OTHER DAY.  . shark liver oil-cocoa butter (PREPARATION H) 0.25-3-85.5 % suppository Place 1 suppository rectally 2 (two) times daily as needed for hemorrhoids.   Carlena Hurl 20 MG TABS tablet TAKE 1 TABLET DAILY WITH SUPPER.  Marland Kitchen zolpidem (AMBIEN) 10 MG tablet TAKE 1 TABLET BY MOUTH AT BEDTIME FOR SLEEP.     Allergies:   Amlodipine, Codeine, Other, and Tramadol   Social History   Tobacco Use  . Smoking status: Never Smoker  . Smokeless tobacco: Never Used  Substance Use Topics  . Alcohol use: No    Alcohol/week: 0.0 standard drinks  . Drug use: No     Family Hx: The patient's family history includes Alzheimer's disease in her mother; Cancer in her mother; Diabetes in her brother and  daughter; Hypertension in her father and mother; Other in her brother and father. There is no history of Anesthesia problems, Hypotension, Malignant hyperthermia, or Pseudochol deficiency.  ROS:   Please see the history of present illness.     All other systems reviewed and are negative.   Prior CV studies:   The following studies were reviewed today:   Echocardiogram: 10/2017 Study Conclusions  - Left ventricle: The cavity size was normal. Wall thickness was increased in a pattern of mild LVH. Systolic function was vigorous. The estimated ejection fraction was in the range of 65% to 70%. Wall motion was normal; there were no regional wall motion abnormalities. The study was not technically sufficient to allow evaluation of LV diastolic dysfunction due to atrial fibrillation. Doppler parameters are consistent with high ventricular filling pressure. - Aortic valve: Trileaflet; mildly thickened leaflets. There was mild regurgitation. - Aorta: Mild ascending aortic dilatation. Diameter 4.03 cm. - Mitral  valve: There was mild regurgitation. - Atrial septum: No defect or patent foramen ovale was identified. - Tricuspid valve: There was mild regurgitation. - Pulmonary arteries: Systolic pressure was mildly to moderately increased. PA peak pressure: 41 mm Hg (S).  Labs/Other Tests and Data Reviewed:    EKG:  No ECG reviewed.  Recent Labs: 11/29/2018: ALT 20; Hemoglobin 13.4; Platelets 272 03/07/2019: BUN 12; Creatinine, Ser 0.74; Magnesium 2.1; Potassium 4.2; Sodium 137   Recent Lipid Panel Lab Results  Component Value Date/Time   CHOL 177 09/24/2015 08:37 AM   TRIG 71 11/29/2018 04:38 AM   HDL 57 09/24/2015 08:37 AM   CHOLHDL 3.1 09/24/2015 08:37 AM   CHOLHDL 3.1 11/07/2013 08:07 AM   LDLCALC 93 09/24/2015 08:37 AM    Wt Readings from Last 3 Encounters:  03/28/19 124 lb (56.2 kg)  03/18/19 124 lb 12.8 oz (56.6 kg)  03/07/19 128 lb 3.2 oz (58.2 kg)     Objective:    Vital Signs:  BP (!) 144/98   Pulse 70   Ht 5\' 2"  (1.575 m)   Wt 124 lb (56.2 kg)   BMI 22.68 kg/m    VITAL SIGNS:  reviewed  ASSESSMENT & PLAN:    1.  Persistent atrial fibrillation: Heart rate controlled on Toprol-XL 100 mg daily.  Continue Xarelto for anticoagulation.  2.  Chronic diastolic heart failure: She is not on diuretic therapy given issues with hyponatremia in the past.  She has been educated on close monitoring of sodium and fluid restriction.  I will aim to control blood pressure.  3.  Hypertension: Blood pressure is elevated today.  Currently on Toprol-XL 100 mg daily along with hydralazine and losartan.  She has been intolerant of several other medications.  This may be due to neuropathic headaches and ear effusion.  We talked about the addition of clonidine 0.1 mg at 8 PM.  I told her this may lead to fatigue which may help her sleep better and she was eager to try it.  4.  Hypokalemia: Potassium normal, 4.2 on 03/07/2019.    COVID-19 Education: The signs and symptoms of COVID-19  were discussed with the patient and how to seek care for testing (follow up with PCP or arrange E-visit).  The importance of social distancing was discussed today.  Time:   Today, I have spent 15 minutes with the patient with telehealth technology discussing the above problems.     Medication Adjustments/Labs and Tests Ordered: Current medicines are reviewed at length with the patient today.  Concerns regarding medicines  are outlined above.   Tests Ordered: No orders of the defined types were placed in this encounter.   Medication Changes: No orders of the defined types were placed in this encounter.   Follow Up:  Office visit with APP in 6 months  Signed, Prentice Docker, MD  03/28/2019 10:03 AM    [facility]

## 2019-03-28 NOTE — Addendum Note (Signed)
Addended by: Lesle Chris on: 03/28/2019 01:14 PM   Modules accepted: Orders

## 2019-04-19 DIAGNOSIS — H7201 Central perforation of tympanic membrane, right ear: Secondary | ICD-10-CM | POA: Diagnosis not present

## 2019-04-19 DIAGNOSIS — H9311 Tinnitus, right ear: Secondary | ICD-10-CM | POA: Diagnosis not present

## 2019-04-19 DIAGNOSIS — H9071 Mixed conductive and sensorineural hearing loss, unilateral, right ear, with unrestricted hearing on the contralateral side: Secondary | ICD-10-CM | POA: Diagnosis not present

## 2019-04-20 ENCOUNTER — Ambulatory Visit: Payer: Medicare PPO | Admitting: Gastroenterology

## 2019-04-25 ENCOUNTER — Other Ambulatory Visit: Payer: Self-pay | Admitting: Cardiovascular Disease

## 2019-05-05 ENCOUNTER — Other Ambulatory Visit: Payer: Self-pay

## 2019-05-05 ENCOUNTER — Encounter: Payer: Self-pay | Admitting: Gastroenterology

## 2019-05-05 ENCOUNTER — Ambulatory Visit: Payer: Medicare PPO | Admitting: Gastroenterology

## 2019-05-05 DIAGNOSIS — K644 Residual hemorrhoidal skin tags: Secondary | ICD-10-CM

## 2019-05-05 DIAGNOSIS — K6289 Other specified diseases of anus and rectum: Secondary | ICD-10-CM

## 2019-05-05 MED ORDER — LIDOCAINE-HYDROCORTISONE ACE 3-2.5 % RE KIT: PACK | RECTAL | 0 refills | Status: DC

## 2019-05-05 NOTE — Patient Instructions (Addendum)
DRINK WATER TO KEEP YOUR URINE LIGHT YELLOW.  FOLLOW A HIGH FIBER DIET. AVOID ITEMS THAT CAUSE BLOATING & GAS. SEE INFO BELOW.   USE [place] [facility] CREAM FOUR TIMES A DAY FOR 10 DAYS THEN IF NEEDED TO RELIEVE RECTAL PAIN/PRESSURE/BLEEDING.   YOU CAN SEE DR. Lovell Sheehan TO HAVE AN EVALUATION FOR RECTAL PAIN AND DISCUSS BENEFITS V. RISKS OF HEMORRHOID SURGERY.  FOLLOW UP IN 6 MOS WITH DR. Marletta Lor.  High-Fiber Diet A high-fiber diet changes your normal diet to include more whole grains, legumes, fruits, and vegetables. Changes in the diet involve replacing refined carbohydrates with unrefined foods. The calorie level of the diet is essentially unchanged. The Dietary Reference Intake (recommended amount) for adult males is 38 grams per day. For adult females, it is 25 grams per day. Pregnant and lactating women should consume 28 grams of fiber per day. Fiber is the intact part of a plant that is not broken down during digestion. Functional fiber is fiber that has been isolated from the plant to provide a beneficial effect in the body.  PURPOSE  Increase stool bulk.   Ease and regulate bowel movements.   Lower cholesterol.   REDUCE RISK OF COLON CANCER  INDICATIONS THAT YOU NEED MORE FIBER  Constipation and hemorrhoids.   Uncomplicated diverticulosis (intestine condition) and irritable bowel syndrome.   Weight management.   As a protective measure against hardening of the arteries (atherosclerosis), diabetes, and cancer.   GUIDELINES FOR INCREASING FIBER IN THE DIET  Start adding fiber to the diet slowly. A gradual increase of about 5 more grams (2 servings of most fruits or vegetables) per day is best. Too rapid an increase in fiber may result in constipation, flatulence, and bloating.   Drink enough water and fluids to keep your urine clear or pale yellow. Water, juice, or caffeine-free drinks are recommended. Not drinking enough fluid may cause constipation.   Eat a variety of  high-fiber foods rather than one type of fiber.   Try to increase your intake of fiber through using high-fiber foods rather than fiber pills or supplements that contain small amounts of fiber.   The goal is to change the types of food eaten. Do not supplement your present diet with high-fiber foods, but replace foods in your present diet.

## 2019-05-05 NOTE — Progress Notes (Signed)
Subjective:    Patient ID: Katherine Lane, female    DOB: February 21, 1932, 86 y.o.   MRN: [id number]  Merlyn Albert, MD  HPI BEEN A LONG TIME SINCE SAW SURGEON FOR HEMORRHOIDS. RECTAL BLEEDING RESOLVED AFTER THE BANDING. STILL HAS RECTAL PAIN: MOST DAYS. HAD COVID 4 MOS AGO. GOT 2 COVID SHOTS. BMs: EVERY DAY WITH MAGNESIUM AT NIGHT. NO PROBLEMS AFTER FLEX SIG.  PT DENIES FEVER, CHILLS, HEMATOCHEZIA, HEMATEMESIS, nausea, vomiting, melena, diarrhea, CHEST PAIN, SHORTNESS OF BREATH,  CHANGE IN BOWEL IN HABITS, constipation, abdominal pain, problems swallowing, problems with sedation, OR heartburn or indigestion.  Past Medical History:  Diagnosis Date  . A-fib ([facility])   . Anxiety   . Arthritis   . CHF (congestive heart failure) ([facility])   . Chronic back pain   . Constipation   . Constipation, chronic   . Depression   . Family history of adverse reaction to anesthesia    Mother, Sister, daughter- N/V  . History of blood transfusion    "pregnancy"  . HTN (hypertension)   . Insomnia   . Migraine headache   . Osteoporosis   . PONV (postoperative nausea and vomiting)    "violent"  . Pulmonary edema 06/2016  . Sodium (Na) deficiency   . Trigeminal neuralgia    Past Surgical History:  Procedure Laterality Date  . ANKLE FRACTURE SURGERY Right   . CARDIAC CATHETERIZATION     denies  . CATARACT EXTRACTION W/PHACO Right 07/22/2012   Procedure: CATARACT EXTRACTION PHACO AND INTRAOCULAR LENS PLACEMENT (IOC);  Surgeon: Gemma Payor, MD;  Location: [facility];  Service: Ophthalmology;  Laterality: Right;  CDE:  18.93  . CATARACT EXTRACTION W/PHACO Left 08/09/2012   Procedure: CATARACT EXTRACTION PHACO AND INTRAOCULAR LENS PLACEMENT (IOC);  Surgeon: Gemma Payor, MD;  Location: [facility];  Service: Ophthalmology;  Laterality: Left;  CDE: 17.21  . COLONOSCOPY    . FLEXIBLE SIGMOIDOSCOPY N/A 11/19/2018   Procedure: FLEXIBLE SIGMOIDOSCOPY;  Surgeon: West Bali, MD;  Location: [facility];  Service:  Endoscopy;  Laterality: N/A;  7:30am  . HARDWARE REMOVAL  07/18/2011   Procedure: HARDWARE REMOVAL; Right leg-  Surgeon: Vickki Hearing, MD;  Location: [facility];  Service: Orthopedics;  Laterality: Right;  . HEMORRHOID BANDING N/A 11/19/2018   Procedure: HEMORRHOID BANDING;  Surgeon: West Bali, MD;  [facility];  Service: Endoscopy;  Laterality: N/A;  . KYPHOPLASTY N/A 12/26/2016   Procedure: Lumbar two Lumbar three and Lumbar five KYPHOPLASTY;  Surgeon: Maeola Harman, MD;  [facility] OR;  Service: Neurosurgery;  Laterality: N/A;  . KYPHOPLASTY N/A 02/19/2017   Procedure: KYPHOPLASTY LUMBAR ONE;  Surgeon: Maeola Harman, MD;  Location: [facility] OR;  Service: Neurosurgery;  Laterality: N/A;  KYPHOPLASTY LUMBAR ONE  . NERVE REPAIR  07/18/2011   Procedure: NERVE REPAIR;  Surgeon: Vickki Hearing, MD;  Location: [facility];  Service: Orthopedics;  Laterality: Right;  Right leg superficial peroneal nerve release    . PARTIAL HYSTERECTOMY     Allergies  Allergen Reactions  . Amlodipine Swelling    Leg swelling   . Codeine Nausea And Vomiting and Other (See Comments)    Patient states "intolerance to all pain medications"  (NO OPIOIDS) VERY VIOLENT VOMITING!!  . Other Nausea And Vomiting and Other (See Comments)    general anesthesia  . Tramadol Nausea And Vomiting and Other (See Comments)    "NO OPIOIDS!!! VERY VIOLENT VOMITING!!" per Med History prior to 02/18/17  Current Outpatient Medications  Medication Sig    . hydrALAZINE (APRESOLINE) 50 MG tablet TAKE (1) TABLET BY MOUTH (4) TIMES DAILY.    Marland Kitchen losartan (COZAAR) 50 MG tablet Take 50 mg  two times Daily at 7 AM and at 7 PM (Patient taking differently: Take 50 mg by mouth 2 (two) times daily. Daily at 7 AM and at 7 PM)    . metoprolol succinate (TOPROL-XL) 100 MG 24 hr tablet Take 1 tablet (100 mg total) by mouth daily.    . metoprolol tartrate (LOPRESSOR) 25 MG tablet Take 1 tablet (25 mg total) by mouth daily as needed  (palpitations).    Carlena Hurl 20 MG TABS tablet TAKE 1 TABLET DAILY WITH SUPPER.    Marland Kitchen zolpidem (AMBIEN) 10 MG tablet TAKE 1 TABLET BY MOUTH AT BEDTIME FOR SLEEP.    . ciprofloxacin-dexamethasone (CIPRODEX) OTIC suspension Place 4 drops into affected ear bid    . clobetasol (TEMOVATE) 0.05 % external solution APPLY TO AFFECTED AREAS TWICE DAILY. (Patient taking differently: Apply 1 application topically 2 (two) times daily as needed (psoriasis). Marland Kitchen)    . cloNIDine (CATAPRES) 0.1 MG tablet Take 1 tablet (0.1 mg total) by mouth every evening. At 8:00 p.m.    Marland Kitchen furosemide (LASIX) 20 MG tablet Take 20 mg by mouth daily as needed (weight gain/fluid retention.).     Marland Kitchen hydrocortisone (ANUSOL-HC) 2.5 % rectal cream APPLY RECTALLY THREE TIMES DAILY AS DIRECTED (Patient taking differently: Place 1 application rectally 2 (two) times daily. )    . HYDROmorphone (DILAUDID) 2 MG tablet Take one half to one tablet po every 6 - 8 hours prn pain    . LORazepam (ATIVAN) 0.5 MG tablet TAKE ONE TABLET BY MOUTH UP TO TWICE DAILY AS NEEDED.    . Magnesium 250 MG TABS Take 250 mg by mouth at bedtime.     . Multiple Vitamin (MULTIVITAMIN WITH MINERALS) TABS tablet Take 1 tablet by mouth daily. Centrum Silver    . Multiple Vitamins-Minerals (PRESERVISION AREDS 2 PO) Take 1 tablet by mouth 2 (two) times daily.    . ondansetron (ZOFRAN) 4 MG tablet Take 1 tablet (4 mg total) by mouth every 8 (eight) hours as needed for nausea or vomiting.    Bertram Gala Glycol-Propyl Glycol (LUBRICANT EYE DROPS) 0.4-0.3 % SOLN Place 1 drop into both eyes at bedtime.    . polyethylene glycol powder (MIRALAX) powder Take 17 g daily as needed by mouth for moderate constipation.     . potassium chloride (KLOR-CON) 10 MEQ tablet TAKE 1 TABLET EVERY OTHER DAY.    . shark liver oil-cocoa butter (PREPARATION H) 0.25-3-85.5 % suppository Place 1 suppository rectally 2 (two) times daily as needed for hemorrhoids.      Review of Systems PER HPI  OTHERWISE ALL SYSTEMS ARE NEGATIVE.    Objective:   Physical Exam Constitutional:      General: She is not in acute distress.    Appearance: Normal appearance.  HENT:     Mouth/Throat:     Comments: MASK IN PLACE Eyes:     General: No scleral icterus.    Pupils: Pupils are equal, round, and reactive to light.  Cardiovascular:     Rate and Rhythm: Normal rate and regular rhythm.     Pulses: Normal pulses.     Heart sounds: Normal heart sounds.  Pulmonary:     Effort: Pulmonary effort is normal.     Breath sounds: Normal breath sounds.  Abdominal:  General: Bowel sounds are normal.     Palpations: Abdomen is soft.     Tenderness: There is no abdominal tenderness.  Musculoskeletal:     Cervical back: Normal range of motion.     Right lower leg: No edema.     Left lower leg: No edema.  Lymphadenopathy:     Cervical: No cervical adenopathy.  Skin:    General: Skin is warm and dry.  Neurological:     Mental Status: She is alert and oriented to person, place, and time.     Comments: NO  NEW FOCAL DEFICITS  Psychiatric:        Mood and Affect: Mood normal.     Comments: NORMAL AFFECT        Assessment & Plan:

## 2019-05-05 NOTE — Assessment & Plan Note (Signed)
SYMPTOMS NOT IDEALLY CONTROLLED.  DRINK WATER TO KEEP YOUR URINE LIGHT YELLOW. FOLLOW A HIGH FIBER DIET. AVOID ITEMS THAT CAUSE BLOATING & GAS.  HANDOUT GIVEN. USE  APOT[facility]ARY CREAM FOUR TIMES A DAY FOR 10 DAYS THEN IF NEEDED TO RELIEVE RECTAL PAIN/PRESSURE/BLEEDING. YOU CAN SEE DR. Lovell Sheehan TO HAVE AN EVALUATION FOR RECTAL PAIN AND DISCUSS BENEFITS V. RISKS OF HEMORRHOID SURGERY. FOLLOW UP IN 6 MOS WITH DR.CARVER.

## 2019-05-05 NOTE — Progress Notes (Signed)
Cc'ed to pcp °

## 2019-05-11 ENCOUNTER — Other Ambulatory Visit: Payer: Self-pay | Admitting: Physician Assistant

## 2019-05-11 NOTE — Telephone Encounter (Signed)
This is a [place] pt.  °

## 2019-05-12 ENCOUNTER — Other Ambulatory Visit: Payer: Self-pay | Admitting: Emergency Medicine

## 2019-05-12 MED ORDER — LIDOCAINE-HYDROCORTISONE ACE 3-2.5 % RE KIT: PACK | RECTAL | 1 refills | Status: DC

## 2019-05-12 NOTE — Telephone Encounter (Signed)
Notified robin that rx was sent in

## 2019-05-12 NOTE — Telephone Encounter (Signed)
Pt daughter called in and requested refills on anusol-hc 2.5% rectal cream. She stated that [facility] said that  there are not anymore refills on the medication left, daughter stated that she  is concerned because the provider is leaving the practice in may and she ust wants to make sure her mother has enough medication to last her for awhile because it is working so good for her.

## 2019-05-12 NOTE — Telephone Encounter (Signed)
PLEASE CALL PT. Rx sent TO [facility].

## 2019-05-12 NOTE — Addendum Note (Signed)
Addended by: West Bali on: 05/12/2019 04:17 PM   Modules accepted: Orders

## 2019-05-18 ENCOUNTER — Other Ambulatory Visit: Payer: Self-pay | Admitting: Cardiovascular Disease

## 2019-05-27 ENCOUNTER — Encounter: Payer: Self-pay | Admitting: Family Medicine

## 2019-05-27 DIAGNOSIS — H9011 Conductive hearing loss, unilateral, right ear, with unrestricted hearing on the contralateral side: Secondary | ICD-10-CM | POA: Diagnosis not present

## 2019-05-27 DIAGNOSIS — H7201 Central perforation of tympanic membrane, right ear: Secondary | ICD-10-CM | POA: Diagnosis not present

## 2019-06-10 ENCOUNTER — Other Ambulatory Visit: Payer: Self-pay

## 2019-06-10 ENCOUNTER — Encounter: Payer: Self-pay | Admitting: Adult Health

## 2019-06-10 ENCOUNTER — Ambulatory Visit: Payer: Medicare PPO | Admitting: Adult Health

## 2019-06-10 VITALS — BP 186/100 | HR 77 | Ht 63.0 in | Wt 124.0 lb

## 2019-06-10 DIAGNOSIS — M810 Age-related osteoporosis without current pathological fracture: Secondary | ICD-10-CM | POA: Diagnosis not present

## 2019-06-10 NOTE — Progress Notes (Signed)
  Subjective:     Patient ID: Katherine Lane, female   DOB: 04-19-1932, 86 y.o.   MRN: [id number]  HPI Katherine Lane is a 86 year old white female,widowed,sp hysterectomy in to discuss Prolia, she had osteoporosis, in 2017 of DEXA in femur neck and tried bisphosphonate and could not tolerate,so stopped. She has had COVID with pneumonia and had CHF, and had to take fluid ptill yesterday she said. PCP is Dr Lubertha South  Review of Systems  No pain,no falls   Reviewed past medical,surgical, social and family history. Reviewed medications and allergies.     Objective:   Physical Exam BP (!) 186/100 (BP Location: Right Arm, Patient Position: Sitting, Cuff Size: Normal)   Pulse 77   Ht 5\' 3"  (1.6 m)   Wt 124 lb (56.2 kg)   BMI 21.97 kg/m  Skin warm and dry.  Lungs: clear to ausculation bilaterally. Cardiovascular: regular rate and rhythm.   Fall risk is low Has not had BP meds for 11 am yet We reviewed data on up to date together and not sure benefits out way some of the risks for her at 30.   Assessment:     1. Age-related osteoporosis without current pathological fracture     Plan:     Follow up prn

## 2019-06-20 ENCOUNTER — Telehealth: Payer: Self-pay | Admitting: Family Medicine

## 2019-06-20 ENCOUNTER — Other Ambulatory Visit: Payer: Self-pay | Admitting: *Deleted

## 2019-06-20 MED ORDER — ZOLPIDEM TARTRATE 10 MG PO TABS: ORAL_TABLET | ORAL | 5 refills | Status: DC

## 2019-06-20 NOTE — Telephone Encounter (Signed)
Sorry this is max dose, may add otc melatonin 3 or 5 mg to this

## 2019-06-20 NOTE — Telephone Encounter (Signed)
Patient states the 10mg  works for a few hours and she wakes up and can't go back to sleep. She states if we can not send the higher dosage she is fine with refilling the 10mg  to [facility]

## 2019-06-20 NOTE — Telephone Encounter (Signed)
Patient is requesting refill on Ambien 10 mg also she would like increase on mg. [facility]

## 2019-07-11 ENCOUNTER — Other Ambulatory Visit: Payer: Self-pay | Admitting: Family Medicine

## 2019-07-11 NOTE — Telephone Encounter (Signed)
6 mo worth  

## 2019-07-27 ENCOUNTER — Encounter (HOSPITAL_COMMUNITY): Payer: Self-pay

## 2019-07-27 ENCOUNTER — Other Ambulatory Visit: Payer: Self-pay

## 2019-07-27 ENCOUNTER — Telehealth: Payer: Self-pay | Admitting: *Deleted

## 2019-07-27 ENCOUNTER — Emergency Department (HOSPITAL_COMMUNITY)
Admission: EM | Admit: 2019-07-27 | Discharge: 2019-07-27 | Disposition: A | Discharge status: Home / Self Care | Payer: Medicare PPO | Attending: Emergency Medicine | Admitting: Emergency Medicine

## 2019-07-27 ENCOUNTER — Emergency Department (HOSPITAL_COMMUNITY): Payer: Medicare PPO

## 2019-07-27 DIAGNOSIS — Z885 Allergy status to narcotic agent status: Secondary | ICD-10-CM | POA: Insufficient documentation

## 2019-07-27 DIAGNOSIS — J181 Lobar pneumonia, unspecified organism: Secondary | ICD-10-CM | POA: Insufficient documentation

## 2019-07-27 DIAGNOSIS — Z79899 Other long term (current) drug therapy: Secondary | ICD-10-CM | POA: Diagnosis not present

## 2019-07-27 DIAGNOSIS — R079 Chest pain, unspecified: Secondary | ICD-10-CM | POA: Diagnosis not present

## 2019-07-27 DIAGNOSIS — I5032 Chronic diastolic (congestive) heart failure: Secondary | ICD-10-CM | POA: Diagnosis not present

## 2019-07-27 DIAGNOSIS — J189 Pneumonia, unspecified organism: Secondary | ICD-10-CM

## 2019-07-27 DIAGNOSIS — R071 Chest pain on breathing: Secondary | ICD-10-CM | POA: Diagnosis not present

## 2019-07-27 DIAGNOSIS — J9811 Atelectasis: Secondary | ICD-10-CM | POA: Diagnosis not present

## 2019-07-27 DIAGNOSIS — I110 Hypertensive heart disease with heart failure: Secondary | ICD-10-CM | POA: Insufficient documentation

## 2019-07-27 DIAGNOSIS — I4891 Unspecified atrial fibrillation: Secondary | ICD-10-CM | POA: Diagnosis not present

## 2019-07-27 DIAGNOSIS — J90 Pleural effusion, not elsewhere classified: Secondary | ICD-10-CM | POA: Diagnosis not present

## 2019-07-27 DIAGNOSIS — I517 Cardiomegaly: Secondary | ICD-10-CM | POA: Diagnosis not present

## 2019-07-27 DIAGNOSIS — Z888 Allergy status to other drugs, medicaments and biological substances status: Secondary | ICD-10-CM | POA: Insufficient documentation

## 2019-07-27 LAB — BASIC METABOLIC PANEL
Anion gap: 12 (ref 5–15)
BUN: 14 mg/dL (ref 8–23)
CO2: 23 mmol/L (ref 22–32)
Calcium: 8.7 mg/dL — ABNORMAL LOW (ref 8.9–10.3)
Chloride: 96 mmol/L — ABNORMAL LOW (ref 98–111)
Creatinine, Ser: 0.85 mg/dL (ref 0.44–1.00)
GFR calc Af Amer: >60 mL/min (ref >60)
GFR calc non Af Amer: >60 mL/min (ref >60)
Glucose, Bld: 180 mg/dL — ABNORMAL HIGH (ref 70–99)
Potassium: 3.2 mmol/L — ABNORMAL LOW (ref 3.5–5.1)
Sodium: 131 mmol/L — ABNORMAL LOW (ref 135–145)

## 2019-07-27 LAB — CBC
HCT: 38.4 % (ref 36.0–46.0)
Hemoglobin: 12.4 g/dL (ref 12.0–15.0)
MCH: 30.2 pg (ref 26.0–34.0)
MCHC: 32.3 g/dL (ref 30.0–36.0)
MCV: 93.7 fL (ref 80.0–100.0)
Platelets: 319 10*3/uL (ref 150–400)
RBC: 4.1 MIL/uL (ref 3.87–5.11)
RDW: 15.4 % (ref 11.5–15.5)
WBC: 10.2 10*3/uL (ref 4.0–10.5)
nRBC: 0 % (ref 0.0–0.2)

## 2019-07-27 LAB — TROPONIN I (HIGH SENSITIVITY): Troponin I (High Sensitivity): 10 ng/L (ref <18)

## 2019-07-27 MED ORDER — DOXYCYCLINE HYCLATE 100 MG PO TABS
100.0000 mg | ORAL_TABLET | Freq: Once | ORAL | Status: AC
  Administered 2019-07-27: 100 mg via ORAL
  Filled 2019-07-27: qty 1

## 2019-07-27 MED ORDER — DOXYCYCLINE HYCLATE 100 MG PO CAPS: 100.0000 mg | ORAL_CAPSULE | Freq: Two times a day (BID) | ORAL | 0 refills | Status: AC

## 2019-07-27 NOTE — ED Triage Notes (Addendum)
Pt reports chest pain radiating to left shoulder since last night. Pt reports weakness and pain with inspiration esp on right side

## 2019-07-27 NOTE — Telephone Encounter (Signed)
Daughter Katherine Lane called and states her mom is having nausea, sob, pain through her shoulders when taking a deep breath. Has had several bm this morning which is usual for her. Consult with dr Ladona Ridgel and advised pt to go to ED. Robin notified she should go to ED.

## 2019-07-27 NOTE — ED Provider Notes (Signed)
[facility] EMERGENCY DEPARTMENT Provider Note   CSN: [id number] Arrival date & time: 07/27/19  1540     History Chief Complaint  Patient presents with  . Chest Pain    Katherine Lane is a 86 y.o. female.  HPI   Last night at 9 PM, she had pain in her bilateral shoulders, then her left chest - can't take deep breath due to pain - sharp and radiates to the back - 8/10, similar to pleurisy years ago - she has been coughing up some thick yellow phlegm yesterday - no other cough, denies palpitations, no fevers, chills or vomiting, had 3 loose BM's today - no leg swelling.    Hx of afib - CHF, on xarelto.  Cardiologist is Dr. Jacinta Shoe.  Review of the medical record shows that her echocardiogram from 2019 showed a normal ejection fraction of 65 to 70%.  Most recently seen by cardiology in February 2021   Past Medical History:  Diagnosis Date  . A-fib ([place])   . Anxiety   . Arthritis   . CHF (congestive heart failure) ([place])   . Chronic back pain   . Constipation   . Constipation, chronic   . Depression   . Family history of adverse reaction to anesthesia    Mother, Sister, daughter- N/V  . History of blood transfusion    "pregnancy"  . HTN (hypertension)   . Insomnia   . Migraine headache   . Osteoporosis   . PONV (postoperative nausea and vomiting)    "violent"  . Pulmonary edema 06/2016  . Sodium (Na) deficiency   . Trigeminal neuralgia     Patient Active Problem List   Diagnosis Date Noted  . Age-related osteoporosis without current pathological fracture 06/10/2019  . Hemorrhoids, external, with complication 08/67/6195  . Rectal bleeding   . Rectal pain   . Hemorrhoids, internal, with bleeding 01/06/2018  . Hypertensive urgency 12/16/2017  . Intractable episodic headache   . Visual changes   . Depression with anxiety   . Chronic diastolic congestive heart failure ([place]) 10/31/2017  . Atypical chest pain 10/31/2017  . Compression fracture of L1 lumbar  vertebra (HCC) 02/19/2017  . Lumbar compression fracture ([place]) 12/26/2016  . Gastroesophageal reflux disease without esophagitis 07/18/2016  . Acute on chronic diastolic CHF (congestive heart failure) ([place]) 07/11/2016  . Chronic coughing 07/11/2016  . Pulmonary edema 07/10/2016  . Migraine headache 07/01/2016  . Anxiety 06/30/2016  . Altered mental status 12/06/2015  . Prolonged Q-T interval on ECG 12/06/2015  . Postural dizziness with presyncope 12/06/2015  . PAF (paroxysmal atrial fibrillation) ([place]) 07/14/2013  . Atrial flutter ([place]) 05/12/2013  . Bulging discs 04/20/2013  . Trigeminal neuralgia 01/15/2013  . Cellulitis 07/31/2011  . Mechanical complication of internal orthopedic implant ([place]) 07/21/2011  . 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 Ankle fracture 07/02/2010  . LESION OF PLANTAR NERVE 05/14/2009  . CLOSED BIMALLEOLAR FRACTURE 03/01/2009  . Essential hypertension 11/07/2008  . CONSTIPATION, CHRONIC 11/07/2008  . Osteoporosis 11/07/2008    Past Surgical History:  Procedure Laterality Date  . ANKLE FRACTURE SURGERY Right   . CARDIAC CATHETERIZATION     denies  . CATARACT EXTRACTION W/PHACO Right 07/22/2012   Procedure: CATARACT EXTRACTION PHACO AND INTRAOCULAR LENS PLACEMENT (IOC);  Surgeon: Tonny Branch, MD;  Location: [facility] ORS;  Service: Ophthalmology;  Laterality: Right;  CDE:  18.93  . CATARACT EXTRACTION W/PHACO Left 08/09/2012   Procedure: CATARACT EXTRACTION PHACO AND INTRAOCULAR LENS PLACEMENT (IOC);  Surgeon:  Kerry Hunt, MD;  Location: [facility] ORS;  Service: Ophthalmology;  Laterality: Left;  CDE: 17.21  . COLONOSCOPY    . FLEXIBLE SIGMOIDOSCOPY N/A 11/19/2018   Procedure: FLEXIBLE SIGMOIDOSCOPY;  Surgeon: Fields, Sandi L, MD;  Location: [facility] ENDO SUITE;  Service: Endoscopy;  Laterality: N/A;  7:30am  . HARDWARE REMOVAL  07/18/2011   Procedure: HARDWARE REMOVAL; Right leg-  Surgeon: Stanley E Harrison, MD;  Location: [facility] ORS;  Service: Orthopedics;   Laterality: Right;  . HEMORRHOID BANDING N/A 11/19/2018   Procedure: HEMORRHOID BANDING;  Surgeon: Fields, Sandi L, MD;  Location: [facility] END[facility] SUITE;  Service: Endoscopy;  Laterality: N/A;  . KYPHOPLASTY N/A 12/26/2016   Procedure: Lumbar two Lumbar three and Lumbar five KYPHOPLASTY;  Surgeon: Stern, Joseph, MD;  Location: [facility] OR;  Service: Neurosurgery;  Laterality: N/A;  . KYPHOPLASTY N/A 02/19/2017   Procedure: KYPHOPLASTY LUMBAR ONE;  Surgeon: Stern, Joseph, MD;  Location: [facility] OR;  Service: Neurosurgery;  Laterality: N/A;  KYPHOPLASTY LUMBAR ONE  . NERVE REPAIR  07/18/2011   Procedure: NERVE REPAIR;  Surgeon: Stanley E Harrison, MD;  Location: [facility] ORS;  Service: Orthopedics;  Laterality: Right;  Right leg superficial peroneal nerve release    . PARTIAL HYSTERECTOMY       OB History    Gravida  3   Para  3   Term  3   Preterm      AB      Living  3     SAB      TAB      Ectopic      Multiple      Live Births  3           Family History  Problem Relation Age of Onset  . Hypertension Mother   . Alzheimer's disease Mother   . Cancer Mother   . Hypertension Father   . Other Father        abused pain meds  . Diabetes Brother   . Other Brother        back problems  . Diabetes Daughter   . Anesthesia problems Neg Hx   . Hypotension Neg Hx   . Malignant hyperthermia Neg Hx   . Pseudochol deficiency Neg Hx     Social History   Tobacco Use  . Smoking status: Never Smoker  . Smokeless tobacco: Never Used  Substance Use Topics  . Alcohol use: No    Alcohol/week: 0.0 standard drinks  . Drug use: No    Home Medications Prior to Admission medications   Medication Sig Start Date End Date Taking? Authorizing Provider  clobetasol (TEMOVATE) 0.05 % external solution APPLY TO AFFECTED AREAS TWICE DAILY. Patient taking differently: Apply 1 application topically daily as needed (psoriasis). . 12/02/17  Yes Luking, William S, MD  cloNIDine (CATAPRES) 0.1 MG tablet Take 1  tablet (0.1 mg total) by mouth every evening. At 8:00 p.m. 03/28/19  Yes Koneswaran, Suresh A, MD  furosemide (LASIX) 20 MG tablet Take 20 mg by mouth daily as needed (weight gain/fluid retention.).    Yes [provider]  hydrALAZINE (APRESOLINE) 50 MG tablet TAKE (1) TABLET BY MOUTH (4) TIMES DAILY. Patient taking differently: Take 50 mg by mouth 4 (four) times daily.  05/19/19  Yes Koneswaran, Suresh A, MD  hydrocortisone (ANUSOL-HC) 2.5 % rectal cream APPLY RECTALLY THREE TIMES DAILY AS DIRECTED Patient taking differently: Place 1 application rectally 2 (two) times daily.  10/19/18  Yes Luking, William S, MD  Lidocaine-Hydrocortisone   Ace 3-2.5 % KIT APPLY TO RECTUM QID AS NEEDED FOR RECTAL PAIN OR BLEEDING Patient taking differently: Place 1 application rectally daily as needed (rectal pain or bleeding).  05/12/19  Yes Fields, Sandi L, MD  LORazepam (ATIVAN) 0.5 MG tablet TAKE ONE TABLET BY MOUTH UP TO TWICE DAILY AS NEEDED. Patient taking differently: Take 0.5 mg by mouth 2 (two) times daily as needed for anxiety or sleep. TAKE ONE TABLET BY MOUTH UP TO TWICE DAILY AS NEEDED. 07/11/19  Yes Luking, William S, MD  losartan (COZAAR) 50 MG tablet Take 1 tablet (50 mg total) by mouth 2 (two) times daily. Daily at 7 AM and at 7 PM 05/11/19  Yes Koneswaran, Suresh A, MD  Magnesium 250 MG TABS Take 250 mg by mouth at bedtime.    Yes [provider]  metoprolol succinate (TOPROL-XL) 100 MG 24 hr tablet Take 1 tablet (100 mg total) by mouth daily. 02/25/19  Yes Koneswaran, Suresh A, MD  metoprolol tartrate (LOPRESSOR) 25 MG tablet Take 1 tablet (25 mg total) by mouth daily as needed (palpitations). 02/25/19  Yes Koneswaran, Suresh A, MD  Multiple Vitamin (MULTIVITAMIN WITH MINERALS) TABS tablet Take 1 tablet by mouth daily. Centrum Silver   Yes [provider]  Polyethyl Glycol-Propyl Glycol (LUBRICANT EYE DROPS) 0.4-0.3 % SOLN Place 1 drop into both eyes at bedtime.   Yes [provider]  polyethylene glycol powder (MIRALAX) powder Take 17 g daily as needed by mouth for moderate constipation.    Yes [provider]  potassium chloride (KLOR-CON) 10 MEQ tablet TAKE 1 TABLET EVERY OTHER DAY. Patient taking differently: Take 10 mEq by mouth every other day.  03/09/19  Yes Koneswaran, Suresh A, MD  shark liver oil-cocoa butter (PREPARATION H) 0.25-3-85.5 % suppository Place 1 suppository rectally 2 (two) times daily as needed for hemorrhoids.    Yes [provider]  XARELTO 20 MG TABS tablet TAKE 1 TABLET DAILY WITH SUPPER. Patient taking differently: Take 20 mg by mouth daily with supper.  03/22/19  Yes Koneswaran, Suresh A, MD  zolpidem (AMBIEN) 10 MG tablet TAKE 1 TABLET BY MOUTH AT BEDTIME FOR SLEEP. Patient taking differently: Take 10 mg by mouth at bedtime. TAKE 1 TABLET BY MOUTH AT BEDTIME FOR SLEEP. 06/20/19  Yes Luking, William S, MD  ciprofloxacin-dexamethasone (CIPRODEX) OTIC suspension Place 4 drops into affected ear bid Patient not taking: Reported on 06/10/2019 03/22/19   Luking, William S, MD  doxycycline (VIBRAMYCIN) 100 MG capsule Take 1 capsule (100 mg total) by mouth 2 (two) times daily for 7 days. 07/27/19 08/03/19  Miller, Brian, MD  HYDROmorphone (DILAUDID) 2 MG tablet Take one half to one tablet po every 6 - 8 hours prn pain Patient not taking: Reported on 06/10/2019 03/18/19   Luking, William S, MD  Multiple Vitamins-Minerals (PRESERVISION AREDS 2 PO) Take 1 tablet by mouth 2 (two) times daily.    [provider]  ondansetron (ZOFRAN) 4 MG tablet Take 1 tablet (4 mg total) by mouth every 8 (eight) hours as needed for nausea or vomiting. Patient not taking: Reported on 06/10/2019 03/18/19   Luking, William S, MD    Allergies    Amlodipine, Codeine, Other, and Tramadol  Review of Systems   Review of Systems  All other systems reviewed and are negative.   Physical Exam Updated Vital Signs BP 138/90 (BP Location: Left Arm)    Pulse 96   Temp 98.9 F (37.2 C) (Oral)   Resp (!)   24   Ht 1.575 m (5' 2")   Wt 55.8 kg   SpO2 95%   BMI 22.50 kg/m   Physical Exam Vitals and nursing note reviewed.  Constitutional:      General: She is not in acute distress.    Appearance: She is well-developed.  HENT:     Head: Normocephalic and atraumatic.     Mouth/Throat:     Pharynx: No oropharyngeal exudate.  Eyes:     General: No scleral icterus.       Right eye: No discharge.        Left eye: No discharge.     Conjunctiva/sclera: Conjunctivae normal.     Pupils: Pupils are equal, round, and reactive to light.  Neck:     Thyroid: No thyromegaly.     Vascular: No JVD.  Cardiovascular:     Rate and Rhythm: Normal rate and regular rhythm.     Heart sounds: Normal heart sounds. No murmur. No friction rub. No gallop.   Pulmonary:     Effort: Pulmonary effort is normal. No respiratory distress.     Breath sounds: Normal breath sounds. No wheezing or rales.  Abdominal:     General: Bowel sounds are normal. There is no distension.     Palpations: Abdomen is soft. There is no mass.     Tenderness: There is no abdominal tenderness.  Musculoskeletal:        General: No tenderness. Normal range of motion.     Cervical back: Normal range of motion and neck supple.  Lymphadenopathy:     Cervical: No cervical adenopathy.  Skin:    General: Skin is warm and dry.     Findings: No erythema or rash.  Neurological:     Mental Status: She is alert.     Coordination: Coordination normal.  Psychiatric:        Behavior: Behavior normal.     ED Results / Procedures / Treatments   Labs (all labs ordered are listed, but only abnormal results are displayed) Labs Reviewed  BASIC METABOLIC PANEL - Abnormal; Notable for the following components:      Result Value   Sodium 131 (*)    Potassium 3.2 (*)    Chloride 96 (*)    Glucose, Bld 180 (*)    Calcium 8.7 (*)    All other components within normal limits  CBC  TROPONIN I  (HIGH SENSITIVITY)    EKG EKG Interpretation  Date/Time:  Wednesday July 27 2019 09:48:12 EDT Ventricular Rate:  105 PR Interval:    QRS Duration: 86 QT Interval:  367 QTC Calculation: 464 R Axis:   -28 Text Interpretation: Atrial fibrillation Ventricular premature complex Borderline left axis deviation Probable anterior infarct, age indeterminate Left axis deviation now present - unlike prior, otherwise unchanged Confirmed by Noemi Chapel [phone]) on 07/27/2019 10:04:53 AM   Radiology DG Chest 2 View  Result Date: 07/27/2019 CLINICAL DATA:  Chest pain.  Atrial fibrillation EXAM: CHEST - 2 VIEW COMPARISON:  November 29, 2018 FINDINGS: There is a small left pleural effusion with associated atelectasis in the left base. There is also left midlung atelectasis. There is an apparent granuloma in the right lower lobe region, stable. Lungs elsewhere are clear. There is cardiomegaly with pulmonary vascularity within normal limits. No adenopathy. There is aortic atherosclerosis. Patient is undergone previous kyphoplasty procedures in the upper lumbar region. IMPRESSION: Small left pleural effusion with left base atelectasis. A small area of associated pneumonia in in  the lateral left base cannot be excluded. Mild atelectasis left mid lung also noted. Stable calcified granuloma right lower lung region. Right lung otherwise clear. Stable cardiomegaly.  Aortic Atherosclerosis (ICD10-I70.0). Electronically Signed   By: Lowella Grip III M.D.   On: 07/27/2019 11:31    Procedures Procedures (including critical care time)  Medications Ordered in ED Medications  doxycycline (VIBRA-TABS) tablet 100 mg (has no administration in time range)    ED Course  I have reviewed the triage vital signs and the nursing notes.  Pertinent labs & imaging results that were available during my care of the patient were reviewed by me and considered in my medical decision making (see chart for details).    MDM  Rules/Calculators/A&P                      This patient is very low risk for pulmonary embolism and is already anticoagulated on Xarelto.  Her lung sounds are clear, she has had significant amounts of drainage which may be causing some of the coughing and phlegm from last night and subsequent pleurisy.  She otherwise appears well, vital signs are unremarkable, EKG shows A. fib but no signs of acute ischemia.  Reviewed medical record at length, she has no history of coronary disease or obstructive pathology.  Chest x-ray, labs, the patient is agreeable, she wants to go home, she is willing to stay for the work-up.  I have looked at the chest x-ray, there is a very small effusion with possible pneumonia, this fits with the patient's clinical picture of increasing coughing and phlegm but no having a bit of pleurisy.  Vital signs again reviewed and stable, reassuring, patient well-appearing, given doxycycline and instructions for close follow-up.  Final Clinical Impression(s) / ED Diagnoses Final diagnoses:  Community acquired pneumonia of left lower lobe of lung    Rx / DC Orders ED Discharge Orders         Ordered    doxycycline (VIBRAMYCIN) 100 MG capsule  2 times daily     07/27/19 1136           Noemi Chapel, MD 07/27/19 1138

## 2019-07-27 NOTE — Discharge Instructions (Signed)
Your x-ray shows that you have a small amount of fluid on the left side of your chest with possible pneumonia, please take doxycycline twice a day for the next 7 days, follow-up with your doctor very closely but return to the emergency department for any severe or worsening symptoms.  There is no signs of heart attack at this time

## 2019-07-29 ENCOUNTER — Encounter: Payer: Self-pay | Admitting: Family Medicine

## 2019-07-29 ENCOUNTER — Ambulatory Visit: Payer: Medicare PPO | Admitting: Family Medicine

## 2019-07-29 ENCOUNTER — Other Ambulatory Visit: Payer: Self-pay

## 2019-07-29 VITALS — BP 132/82 | HR 95 | Temp 97.6°F | Wt 125.8 lb

## 2019-07-29 DIAGNOSIS — Z8701 Personal history of pneumonia (recurrent): Secondary | ICD-10-CM | POA: Diagnosis not present

## 2019-07-29 DIAGNOSIS — I10 Essential (primary) hypertension: Secondary | ICD-10-CM | POA: Diagnosis not present

## 2019-07-29 DIAGNOSIS — J90 Pleural effusion, not elsewhere classified: Secondary | ICD-10-CM

## 2019-07-29 DIAGNOSIS — E876 Hypokalemia: Secondary | ICD-10-CM | POA: Diagnosis not present

## 2019-07-29 DIAGNOSIS — Z79899 Other long term (current) drug therapy: Secondary | ICD-10-CM | POA: Diagnosis not present

## 2019-07-29 DIAGNOSIS — I480 Paroxysmal atrial fibrillation: Secondary | ICD-10-CM

## 2019-07-29 MED ORDER — POTASSIUM CHLORIDE CRYS ER 10 MEQ PO TBCR: 10.0000 meq | EXTENDED_RELEASE_TABLET | ORAL | 3 refills | Status: DC

## 2019-07-29 NOTE — Telephone Encounter (Signed)
FYI Pt has called Kt into to [facility], however she want to made sure it is filled today because she only has 2 tablets left.   thanks,renee

## 2019-07-29 NOTE — Telephone Encounter (Signed)
Refilled to [facility] 

## 2019-07-29 NOTE — Progress Notes (Signed)
   Subjective:    Patient ID: Katherine Lane, female    DOB: 03-Nov-1932, 86 y.o.   MRN: [id number]  HPI Patient comes in today for follow up on ER visit for pneumonia.  Patient is starting to feel better, this is the first day she has been able to take a breath without pain. Having some wheezing and SOB with exertion. Taking doxycyline.  Patient was in the ER diagnosed with a pleural effusion and pneumonia treated with antibiotics here today for follow-up states her breathing seems to be doing fairly well Patient also complains of nausea x several months since starting hydralazine. No appetite/weight loss.  Patient relates intermittent nausea that is ongoing. Fall Risk  06/10/2019  Falls in the past year? 0  Number falls in past yr: 0  Injury with Fall? 0    Review of Systems Please see above    Objective:   Physical Exam Her lungs are clear respiratory rate normal not tachycardic extremities no edema skin warm dry blood pressure good      Assessment & Plan:  1. Essential hypertension Blood pressure good control continue current measures - Basic metabolic panel  2. PAF (paroxysmal atrial fibrillation) (HCC) Atrial fib under decent control we will get a chest x-ray as a follow-up to her pneumonia - DG Chest 4 View  3. High risk medication use Liver function indicated to look at albumin because of pleural effusion - Hepatic function panel  4. Pleural effusion Liver function indicated  5. History of pneumonia Chest x-ray indicated do this in 3 weeks  Patient to do follow-up in 2 months Also hyperkalemia when she was in the ER repeat metabolic center

## 2019-08-01 DIAGNOSIS — I10 Essential (primary) hypertension: Secondary | ICD-10-CM | POA: Diagnosis not present

## 2019-08-01 DIAGNOSIS — Z79899 Other long term (current) drug therapy: Secondary | ICD-10-CM | POA: Diagnosis not present

## 2019-08-02 ENCOUNTER — Encounter: Payer: Self-pay | Admitting: Family Medicine

## 2019-08-02 LAB — HEPATIC FUNCTION PANEL
ALT: 13 IU/L (ref 0–32)
AST: 19 IU/L (ref 0–40)
Albumin: 4 g/dL (ref 3.6–4.6)
Alkaline Phosphatase: 117 IU/L (ref 48–121)
Bilirubin Total: 0.8 mg/dL (ref 0.0–1.2)
Bilirubin, Direct: 0.29 mg/dL (ref 0.00–0.40)
Total Protein: 6.8 g/dL (ref 6.0–8.5)

## 2019-08-02 LAB — BASIC METABOLIC PANEL
BUN/Creatinine Ratio: 16 (ref 12–28)
BUN: 16 mg/dL (ref 8–27)
CO2: 21 mmol/L (ref 20–29)
Calcium: 9.3 mg/dL (ref 8.7–10.3)
Chloride: 98 mmol/L (ref 96–106)
Creatinine, Ser: 0.97 mg/dL (ref 0.57–1.00)
GFR calc Af Amer: 62 mL/min/{1.73_m2} (ref >59)
GFR calc non Af Amer: 54 mL/min/{1.73_m2} — ABNORMAL LOW (ref >59)
Glucose: 113 mg/dL — ABNORMAL HIGH (ref 65–99)
Potassium: 5.1 mmol/L (ref 3.5–5.2)
Sodium: 137 mmol/L (ref 134–144)

## 2019-08-08 ENCOUNTER — Telehealth: Payer: Self-pay

## 2019-08-08 NOTE — Telephone Encounter (Signed)
Yes, bringing form to AB office.

## 2019-08-08 NOTE — Telephone Encounter (Signed)
RX was signed by AB and faxed to pts pharmacy. Pt is aware that RX was sent to her pharmacy.

## 2019-08-08 NOTE — Telephone Encounter (Addendum)
May refill as requested. Is there a paper form for this?

## 2019-08-08 NOTE — Telephone Encounter (Signed)
Refill request received from [facility] Hem[facility] #30, apply to rectum four times daily as needed for pain or bleeding.

## 2019-08-12 ENCOUNTER — Ambulatory Visit (HOSPITAL_COMMUNITY)
Admission: RE | Admit: 2019-08-12 | Discharge: 2019-08-12 | Disposition: A | Discharge status: Home / Self Care | Payer: Medicare PPO | Source: Ambulatory Visit | Attending: Family Medicine | Admitting: Family Medicine

## 2019-08-12 ENCOUNTER — Other Ambulatory Visit: Payer: Self-pay | Admitting: *Deleted

## 2019-08-12 ENCOUNTER — Ambulatory Visit (INDEPENDENT_AMBULATORY_CARE_PROVIDER_SITE_OTHER): Payer: Medicare PPO | Admitting: Family Medicine

## 2019-08-12 ENCOUNTER — Other Ambulatory Visit: Payer: Self-pay

## 2019-08-12 ENCOUNTER — Encounter: Payer: Self-pay | Admitting: Family Medicine

## 2019-08-12 ENCOUNTER — Other Ambulatory Visit (HOSPITAL_COMMUNITY)
Admission: RE | Admit: 2019-08-12 | Discharge: 2019-08-12 | Disposition: A | Discharge status: Home / Self Care | Payer: Medicare PPO | Source: Ambulatory Visit | Attending: Family Medicine | Admitting: Family Medicine

## 2019-08-12 VITALS — BP 140/82 | HR 112 | Temp 97.5°F | Wt 122.2 lb

## 2019-08-12 DIAGNOSIS — R8290 Unspecified abnormal findings in urine: Secondary | ICD-10-CM

## 2019-08-12 DIAGNOSIS — R531 Weakness: Secondary | ICD-10-CM | POA: Diagnosis not present

## 2019-08-12 DIAGNOSIS — Z8701 Personal history of pneumonia (recurrent): Secondary | ICD-10-CM | POA: Insufficient documentation

## 2019-08-12 DIAGNOSIS — R509 Fever, unspecified: Secondary | ICD-10-CM | POA: Insufficient documentation

## 2019-08-12 DIAGNOSIS — J90 Pleural effusion, not elsewhere classified: Secondary | ICD-10-CM | POA: Diagnosis not present

## 2019-08-12 DIAGNOSIS — R5383 Other fatigue: Secondary | ICD-10-CM | POA: Diagnosis not present

## 2019-08-12 DIAGNOSIS — I517 Cardiomegaly: Secondary | ICD-10-CM | POA: Diagnosis not present

## 2019-08-12 LAB — COMPREHENSIVE METABOLIC PANEL
ALT: 13 U/L (ref 0–44)
AST: 20 U/L (ref 15–41)
Albumin: 3.5 g/dL (ref 3.5–5.0)
Alkaline Phosphatase: 93 U/L (ref 38–126)
Anion gap: 11 (ref 5–15)
BUN: 14 mg/dL (ref 8–23)
CO2: 28 mmol/L (ref 22–32)
Calcium: 9.1 mg/dL (ref 8.9–10.3)
Chloride: 96 mmol/L — ABNORMAL LOW (ref 98–111)
Creatinine, Ser: 0.84 mg/dL (ref 0.44–1.00)
GFR calc Af Amer: >60 mL/min (ref >60)
GFR calc non Af Amer: >60 mL/min (ref >60)
Glucose, Bld: 108 mg/dL — ABNORMAL HIGH (ref 70–99)
Potassium: 3.8 mmol/L (ref 3.5–5.1)
Sodium: 135 mmol/L (ref 135–145)
Total Bilirubin: 0.8 mg/dL (ref 0.3–1.2)
Total Protein: 6.9 g/dL (ref 6.5–8.1)

## 2019-08-12 LAB — CBC WITH DIFFERENTIAL/PLATELET
Abs Immature Granulocytes: 0.03 10*3/uL (ref 0.00–0.07)
Basophils Absolute: 0 10*3/uL (ref 0.0–0.1)
Basophils Relative: 0 %
Eosinophils Absolute: 0 10*3/uL (ref 0.0–0.5)
Eosinophils Relative: 0 %
HCT: 40.9 % (ref 36.0–46.0)
Hemoglobin: 13 g/dL (ref 12.0–15.0)
Immature Granulocytes: 0 %
Lymphocytes Relative: 9 %
Lymphs Abs: 0.9 10*3/uL (ref 0.7–4.0)
MCH: 29.9 pg (ref 26.0–34.0)
MCHC: 31.8 g/dL (ref 30.0–36.0)
MCV: 94 fL (ref 80.0–100.0)
Monocytes Absolute: 1 10*3/uL (ref 0.1–1.0)
Monocytes Relative: 10 %
Neutro Abs: 7.7 10*3/uL (ref 1.7–7.7)
Neutrophils Relative %: 81 %
Platelets: 490 10*3/uL — ABNORMAL HIGH (ref 150–400)
RBC: 4.35 MIL/uL (ref 3.87–5.11)
RDW: 14.5 % (ref 11.5–15.5)
WBC: 9.6 10*3/uL (ref 4.0–10.5)
nRBC: 0 % (ref 0.0–0.2)

## 2019-08-12 LAB — POCT URINALYSIS DIPSTICK (MANUAL)
Leukocytes, UA: NEGATIVE
Nitrite, UA: NEGATIVE
Poct Bilirubin: NEGATIVE
Poct Blood: NEGATIVE
Poct Glucose: NORMAL mg/dL
Poct Ketones: NEGATIVE
Poct Protein: 30 mg/dL — AB
Poct Urobilinogen: NORMAL mg/dL
Spec Grav, UA: 1.02 (ref 1.010–1.025)
pH, UA: 5 (ref 5.0–8.0)

## 2019-08-12 NOTE — Progress Notes (Signed)
   Subjective:    Patient ID: Katherine Lane, female    DOB: 1932/06/04, 86 y.o.   MRN: [id number]  HPI Patient comes in today with complaints of fatigue, weakness and states she runs a fever with body aches and chills every afternoon for about 4 days. Tylenol helps break the fever.  Results for orders placed or performed in visit on 08/12/19  POCT Urinalysis Dip Manual  Result Value Ref Range   Spec Grav, UA 1.020 1.010 - 1.025   pH, UA 5.0 5.0 - 8.0   Leukocytes, UA Negative Negative   Nitrite, UA Negative Negative   Poct Protein +30 (A) Negative, trace mg/dL   Poct Glucose Normal Normal mg/dL   Poct Ketones Negative Negative   Poct Urobilinogen Normal Normal mg/dL   Poct Bilirubin Negative Negative   Poct Blood Negative Negative, trace   Patient denies high fever chills sweats relates low-grade fever.  Relates some foul odor to the urine.  Had recent pneumonia.  States her breathing seems to be okay Review of Systems    Please see above. Objective:   Physical Exam  Lungs are clear no crackles heart regular extremities no edema skin warm dry      Assessment & Plan:  Recent pneumonia Follow-up x-ray indicated Follow-up lab work indicated Urine culture indicated   Should be noted that the test came back looking good patient was reassured she was encouraged to check her temperature 3 times daily and was encouraged to follow-up with [place] this coming week

## 2019-08-16 ENCOUNTER — Other Ambulatory Visit: Payer: Self-pay | Admitting: *Deleted

## 2019-08-16 MED ORDER — PENICILLIN V POTASSIUM 500 MG PO TABS: 500.0000 mg | ORAL_TABLET | Freq: Four times a day (QID) | ORAL | 0 refills | Status: DC

## 2019-08-18 LAB — URINE CULTURE

## 2019-08-19 ENCOUNTER — Encounter: Payer: Self-pay | Admitting: Family Medicine

## 2019-08-19 ENCOUNTER — Ambulatory Visit: Payer: Medicare PPO | Admitting: Family Medicine

## 2019-08-19 ENCOUNTER — Other Ambulatory Visit: Payer: Self-pay

## 2019-08-19 VITALS — BP 116/74 | HR 70 | Temp 98.4°F | Ht 62.0 in | Wt 123.0 lb

## 2019-08-19 DIAGNOSIS — N3000 Acute cystitis without hematuria: Secondary | ICD-10-CM

## 2019-08-19 DIAGNOSIS — R509 Fever, unspecified: Secondary | ICD-10-CM | POA: Diagnosis not present

## 2019-08-19 DIAGNOSIS — J90 Pleural effusion, not elsewhere classified: Secondary | ICD-10-CM | POA: Diagnosis not present

## 2019-08-19 MED ORDER — SULFAMETHOXAZOLE-TRIMETHOPRIM 800-160 MG PO TABS: 1.0000 | ORAL_TABLET | Freq: Two times a day (BID) | ORAL | 0 refills | Status: DC

## 2019-08-19 NOTE — Progress Notes (Signed)
   Subjective:    Patient ID: Katherine Lane, female    DOB: [birth year], 86 y.o.   MRN: [id number]  HPI pt is with daughter [name].  follow up.  States she is being treated for pneumonia and UTI. Taking pen for UTI. Feeling weak. Runs fever every evening. Sob off and on for awhile.  Very nice patient Intermittent shortness of breath Denies any chest pressure pain denies swelling legs Appetite fair Intermittent nausea issues Denies any high fevers but does relate some low-grade fevers Recent urine culture came back showing 2 different bacteria   Review of Systems    See above Objective:   Physical Exam Atrial fib but rate is controlled extremities no edema skin warm dry no crackles in the lungs       Assessment & Plan:  Urine culture finish out antibiotic add Bactrim to cover first E. coli if significant troubles notify us Check temperature twice daily Give [place] update within 10 days Follow-up in 3  weeks If persistent fevers neck step would be blood cultures and echo Awaiting pulmonary appointment with pulmonary doctor

## 2019-08-23 ENCOUNTER — Encounter: Payer: Self-pay | Admitting: Family Medicine

## 2019-08-23 ENCOUNTER — Telehealth: Payer: Self-pay | Admitting: *Deleted

## 2019-08-23 NOTE — Telephone Encounter (Signed)
Daughter sent My chart message to follow up on her mom:  She has continued to have fevers everyday. Still starting mid afternoon and night. She started taking the new meds the day of her visit last week along with the first set of meds as well but the fever still persists. It has been around 100 - 101 but she will take tylenol and it brings it down. There as really been no change in her strength as well.    Also we still have not receive a date for the Pulmonary doctor.  My number is [phone] if you can call me.   Thanks, [name]

## 2019-08-23 NOTE — Telephone Encounter (Signed)
Nurses  #1 please speak with Enid Derry regarding pulmonary referral to find out where is this in the process?  Who do we call to try to get at official date for her appointment?  Given that I am the only provider this week I will need staff to work on this and get back with me regarding the answer before we can talk with [name]  Please also turned this into the telephone message so that I will have prompt to call later today once this further information is found out

## 2019-08-23 NOTE — Telephone Encounter (Signed)
My chart message was converted to phone message and routed to Dr Lorin Picket for reply

## 2019-08-23 NOTE — Telephone Encounter (Signed)
Referral to pulmonology has been sent to the specialist and awaiting an appt response.

## 2019-08-24 ENCOUNTER — Encounter: Payer: Self-pay | Admitting: Family Medicine

## 2019-08-24 NOTE — Telephone Encounter (Signed)
I did discuss the case with Katherine Lane patient had been running off-and-on fevers up until the past couple days.  No fevers currently.  They will monitor closely if fevers recur then the patient may well need echo and lab work and blood culture The patient will be coming by Tuesday of next week for nurse visit for a UA and urine culture. If the patient comes by while I am here please allow me to see the UA while the patient is present.

## 2019-08-25 ENCOUNTER — Encounter (HOSPITAL_COMMUNITY): Payer: Self-pay

## 2019-08-25 ENCOUNTER — Other Ambulatory Visit: Payer: Self-pay

## 2019-08-25 ENCOUNTER — Emergency Department (HOSPITAL_COMMUNITY): Payer: Medicare PPO

## 2019-08-25 ENCOUNTER — Telehealth: Payer: Self-pay | Admitting: Family Medicine

## 2019-08-25 ENCOUNTER — Emergency Department (HOSPITAL_COMMUNITY)
Admission: EM | Admit: 2019-08-25 | Discharge: 2019-08-25 | Disposition: A | Discharge status: Home / Self Care | Payer: Medicare PPO | Attending: Emergency Medicine | Admitting: Emergency Medicine

## 2019-08-25 DIAGNOSIS — S22030A Wedge compression fracture of third thoracic vertebra, initial encounter for closed fracture: Secondary | ICD-10-CM | POA: Insufficient documentation

## 2019-08-25 DIAGNOSIS — I110 Hypertensive heart disease with heart failure: Secondary | ICD-10-CM | POA: Insufficient documentation

## 2019-08-25 DIAGNOSIS — Y92003 Bedroom of unspecified non-institutional (private) residence as the place of occurrence of the external cause: Secondary | ICD-10-CM | POA: Diagnosis not present

## 2019-08-25 DIAGNOSIS — I5032 Chronic diastolic (congestive) heart failure: Secondary | ICD-10-CM | POA: Diagnosis not present

## 2019-08-25 DIAGNOSIS — W010XXA Fall on same level from slipping, tripping and stumbling without subsequent striking against object, initial encounter: Secondary | ICD-10-CM | POA: Diagnosis not present

## 2019-08-25 DIAGNOSIS — Y9389 Activity, other specified: Secondary | ICD-10-CM | POA: Insufficient documentation

## 2019-08-25 DIAGNOSIS — Z79899 Other long term (current) drug therapy: Secondary | ICD-10-CM | POA: Insufficient documentation

## 2019-08-25 DIAGNOSIS — S15102A Unspecified injury of left vertebral artery, initial encounter: Secondary | ICD-10-CM

## 2019-08-25 DIAGNOSIS — Z20822 Contact with and (suspected) exposure to covid-19: Secondary | ICD-10-CM | POA: Diagnosis not present

## 2019-08-25 DIAGNOSIS — Y999 Unspecified external cause status: Secondary | ICD-10-CM | POA: Diagnosis not present

## 2019-08-25 DIAGNOSIS — S12041A Nondisplaced lateral mass fracture of first cervical vertebra, initial encounter for closed fracture: Secondary | ICD-10-CM | POA: Diagnosis not present

## 2019-08-25 DIAGNOSIS — S0990XA Unspecified injury of head, initial encounter: Secondary | ICD-10-CM | POA: Diagnosis not present

## 2019-08-25 DIAGNOSIS — S199XXA Unspecified injury of neck, initial encounter: Secondary | ICD-10-CM | POA: Diagnosis present

## 2019-08-25 LAB — LACTIC ACID, PLASMA: Lactic Acid, Venous: 1.5 mmol/L (ref 0.5–1.9)

## 2019-08-25 LAB — CBC WITH DIFFERENTIAL/PLATELET
Abs Immature Granulocytes: 0.04 10*3/uL (ref 0.00–0.07)
Basophils Absolute: 0 10*3/uL (ref 0.0–0.1)
Basophils Relative: 0 %
Eosinophils Absolute: 0 10*3/uL (ref 0.0–0.5)
Eosinophils Relative: 0 %
HCT: 38.4 % (ref 36.0–46.0)
Hemoglobin: 12.3 g/dL (ref 12.0–15.0)
Immature Granulocytes: 1 %
Lymphocytes Relative: 19 %
Lymphs Abs: 1 10*3/uL (ref 0.7–4.0)
MCH: 28.5 pg (ref 26.0–34.0)
MCHC: 32 g/dL (ref 30.0–36.0)
MCV: 89.1 fL (ref 80.0–100.0)
Monocytes Absolute: 0.4 10*3/uL (ref 0.1–1.0)
Monocytes Relative: 8 %
Neutro Abs: 3.8 10*3/uL (ref 1.7–7.7)
Neutrophils Relative %: 72 %
Platelets: 349 10*3/uL (ref 150–400)
RBC: 4.31 MIL/uL (ref 3.87–5.11)
RDW: 15 % (ref 11.5–15.5)
WBC: 5.3 10*3/uL (ref 4.0–10.5)
nRBC: 0 % (ref 0.0–0.2)

## 2019-08-25 LAB — COMPREHENSIVE METABOLIC PANEL
ALT: 16 U/L (ref 0–44)
AST: 25 U/L (ref 15–41)
Albumin: 3.2 g/dL — ABNORMAL LOW (ref 3.5–5.0)
Alkaline Phosphatase: 98 U/L (ref 38–126)
Anion gap: 9 (ref 5–15)
BUN: 13 mg/dL (ref 8–23)
CO2: 22 mmol/L (ref 22–32)
Calcium: 8.7 mg/dL — ABNORMAL LOW (ref 8.9–10.3)
Chloride: 100 mmol/L (ref 98–111)
Creatinine, Ser: 0.96 mg/dL (ref 0.44–1.00)
GFR calc Af Amer: >60 mL/min (ref >60)
GFR calc non Af Amer: 54 mL/min — ABNORMAL LOW (ref >60)
Glucose, Bld: 108 mg/dL — ABNORMAL HIGH (ref 70–99)
Potassium: 4 mmol/L (ref 3.5–5.1)
Sodium: 131 mmol/L — ABNORMAL LOW (ref 135–145)
Total Bilirubin: 1.2 mg/dL (ref 0.3–1.2)
Total Protein: 6.9 g/dL (ref 6.5–8.1)

## 2019-08-25 LAB — URINALYSIS, ROUTINE W REFLEX MICROSCOPIC
Bilirubin Urine: NEGATIVE
Glucose, UA: NEGATIVE mg/dL
Hgb urine dipstick: NEGATIVE
Ketones, ur: NEGATIVE mg/dL
Leukocytes,Ua: NEGATIVE
Nitrite: NEGATIVE
Protein, ur: 30 mg/dL — AB
Specific Gravity, Urine: 1.019 (ref 1.005–1.030)
pH: 5 (ref 5.0–8.0)

## 2019-08-25 LAB — SARS CORONAVIRUS 2 BY RT PCR (HOSPITAL ORDER, PERFORMED IN ~~LOC~~ HOSPITAL LAB): SARS Coronavirus 2: NEGATIVE

## 2019-08-25 MED ORDER — LORAZEPAM 0.5 MG PO TABS
0.5000 mg | ORAL_TABLET | Freq: Once | ORAL | Status: AC
  Administered 2019-08-25: 0.5 mg via ORAL
  Filled 2019-08-25: qty 1

## 2019-08-25 MED ORDER — HYDROMORPHONE HCL 1 MG/ML IJ SOLN
0.5000 mg | Freq: Once | INTRAMUSCULAR | Status: AC
  Administered 2019-08-25: 0.5 mg via INTRAVENOUS
  Filled 2019-08-25: qty 1

## 2019-08-25 MED ORDER — IOHEXOL 350 MG/ML SOLN
75.0000 mL | Freq: Once | INTRAVENOUS | Status: AC | PRN
  Administered 2019-08-25: 75 mL via INTRAVENOUS

## 2019-08-25 MED ORDER — HYDROCODONE-ACETAMINOPHEN 5-325 MG PO TABS: 1.0000 | ORAL_TABLET | Freq: Four times a day (QID) | ORAL | 0 refills | Status: DC | PRN

## 2019-08-25 MED ORDER — PIPERACILLIN-TAZOBACTAM 3.375 G IVPB 30 MIN
3.3750 g | Freq: Once | INTRAVENOUS | Status: AC
  Administered 2019-08-25: 3.375 g via INTRAVENOUS
  Filled 2019-08-25: qty 50

## 2019-08-25 MED ORDER — SODIUM CHLORIDE 0.9 % IV BOLUS
500.0000 mL | Freq: Once | INTRAVENOUS | Status: AC
  Administered 2019-08-25: 500 mL via INTRAVENOUS

## 2019-08-25 MED ORDER — PROCHLORPERAZINE MALEATE 5 MG PO TABS
10.0000 mg | ORAL_TABLET | Freq: Three times a day (TID) | ORAL | 0 refills | Status: DC | PRN
Start: 2019-08-25 — End: 2019-10-03

## 2019-08-25 MED ORDER — PROCHLORPERAZINE EDISYLATE 10 MG/2ML IJ SOLN
5.0000 mg | Freq: Once | INTRAMUSCULAR | Status: AC
  Administered 2019-08-25: 5 mg via INTRAVENOUS
  Filled 2019-08-25: qty 2

## 2019-08-25 NOTE — Discharge Instructions (Addendum)
It is important to wear the brace on your neck and upper back, all the time until you see Dr. Venetia Maxon.  Try to avoid getting it wet.  You have fractures of C1 and T3 of your vertebrae.  We have sent prescriptions for pain reliever to your pharmacy to use if needed.  You can try using Tylenol every 4 hours for pain as well.  For the fever you are having, follow-up with Dr. Gerda Diss.  We repeated your urinalysis today and it did not show infection.  We also sent a urine culture to make sure that there was no infection.  Return here, if needed, for problems.

## 2019-08-25 NOTE — ED Notes (Signed)
Verbalized understanding of DC instructions, Rx, follow up care with PCP/Neurosurgery

## 2019-08-25 NOTE — Telephone Encounter (Signed)
See My Chart Message- Daughter states the patient was running fever of 100 last night and has dark stools- Office visit today at 11:30 am per Dr Lorin Picket.

## 2019-08-25 NOTE — ED Notes (Signed)
Pt dressed out and placed in C-Collar

## 2019-08-25 NOTE — ED Notes (Signed)
Pt transported to CT ?

## 2019-08-25 NOTE — ED Notes (Signed)
Ortho Technician en route to place outside vendor brace

## 2019-08-25 NOTE — Telephone Encounter (Signed)
Daughter contacted office. Pt fell this morning getting out of bed and is having neck pain. Pt is very weak, nauseated and fever off and on. Daughter states that patients fever only spikes in the evening. Provider did speak with family last night.   Spoke with provider; provider recommends ER. If patient daughter did not want to utilize ER, we could see her at 11:15 am. If daughter takes pt to ER and she is discharged today we can do a follow up tomorrow. Spoke with patient daughter, daughter will take patient to [facility] ER. Contacted [facility] and informed them that patient would be coming.

## 2019-08-25 NOTE — ED Provider Notes (Signed)
4:25- Mechanical fall. Cervical spine and upper thorax spine fractures. Neuro intact.   4:55 PM-patient in room with daughter.  Patient complains of pain in her neck.  She denies other problems at this time.  She has normal grip strength in bilateral hands.  She is currently wearing ill fitting hard cervical collar.  Nursing has been informed to put patient in a examination gown, and fix the collar correctly.  Daughter reports that patient has had a fever for 3 weeks, usually in the evenings, during which time she has been treated with antibiotic for an E. coli urinary tract infection.  She is due to have a repeat urinalysis done next week.  Her doctor is reportedly interested in getting a cardiac echo to rule out endocarditis if she continues to have fever without a source.  5:30 PM-I discussed the case with the PA Costello, on-call for Dr. Conchita Paris.  There are neurosurgical providers.  Mr. Marilynn Rail has ordered a cervical thoracic orthotic, to treat the cervical spine and thoracic spine injuries.  He states that after CTA, if stable, the patient can be discharged to follow-up with Dr. Venetia Maxon, her usual neurosurgeon, as an outpatient.  10:30 PM-I discussed case findings with vascular surgery, Dr. Randie Heinz, and neurosurgery, PA Costello.  Patient is to be maintained on Xarelto for grade 1 vertebral artery injury, and CTO brace for injuries to cervical and thoracic spine vertebrae.  At this time she is alert, and fairly comfortable, and understands these findings.  Patient's daughter in the room as well, for discussion of findings and treatment plan.  Prescription for Norco and Compazine were sent to her pharmacy.  Plan for her to follow-up with neurosurgery for ongoing management of the spine injuries, and primary care for evaluation and treatment of concern for ongoing fever.  Urinalysis did not show infection today, blood cultures were sent.  Patient is improved and stable for discharge.  CT Head Wo  Contrast  Result Date: 08/25/2019 CLINICAL DATA:  Slid out of bed this morning. Denies direct fall or hitting head/neck. EXAM: CT HEAD WITHOUT CONTRAST CT CERVICAL SPINE WITHOUT CONTRAST TECHNIQUE: Multidetector CT imaging of the head and cervical spine was performed following the standard protocol without intravenous contrast. Multiplanar CT image reconstructions of the cervical spine were also generated. COMPARISON:  Chest x-ray dated August 12, 2019. CT head dated December 16, 2016. CT cervical spine dated December 06, 2015. FINDINGS: CT HEAD FINDINGS Brain: No evidence of acute infarction, hemorrhage, hydrocephalus, extra-axial collection or mass lesion/mass effect. Stable mild atrophy. Mildly progressed severe chronic microvascular ischemic changes with new chronic right occipital lobe infarct. Vascular: Atherosclerotic vascular calcification of the carotid siphons. No hyperdense vessel. Skull: Normal. Negative for fracture or focal lesion. Sinuses/Orbits: No acute finding. Other: None. CT CERVICAL SPINE FINDINGS Alignment: Normal. Skull base and vertebrae: Acute comminuted nondisplaced fracture through the left lateral mass of C1 (series 5, image 22; series 9, image 39), with involvement of the transverse foramen. New severe T3 compression fracture with 70% height loss and 5 mm retropulsion. No primary bone lesion or focal pathologic process. Soft tissues and spinal canal: No prevertebral fluid or swelling. No visible canal hematoma. Disc levels: Unchanged mild disc height loss at C4-C5 and C6-C7. Unchanged moderate disc height loss and uncovertebral hypertrophy C5-C6. Upper chest: Partially visualized right pleural effusion. Other: None. IMPRESSION: CT head: 1. No acute intracranial abnormality. 2. Mildly progressed severe chronic microvascular ischemic changes with new chronic right occipital lobe infarct. CT cervical spine: 1. Acute  comminuted nondisplaced fracture through the left lateral mass of C1 with  involvement of the transverse foramen. Recommend CTA of the neck to evaluate for vertebral artery injury. 2. Acute severe T3 compression fracture with 70% height loss and 5 mm retropulsion. 3. Partially visualized right pleural effusion. Electronically Signed   By: Obie Dredge M.D.   On: 08/25/2019 15:44   CT Cervical Spine Wo Contrast  Result Date: 08/25/2019 CLINICAL DATA:  Slid out of bed this morning. Denies direct fall or hitting head/neck. EXAM: CT HEAD WITHOUT CONTRAST CT CERVICAL SPINE WITHOUT CONTRAST TECHNIQUE: Multidetector CT imaging of the head and cervical spine was performed following the standard protocol without intravenous contrast. Multiplanar CT image reconstructions of the cervical spine were also generated. COMPARISON:  Chest x-ray dated August 12, 2019. CT head dated December 16, 2016. CT cervical spine dated December 06, 2015. FINDINGS: CT HEAD FINDINGS Brain: No evidence of acute infarction, hemorrhage, hydrocephalus, extra-axial collection or mass lesion/mass effect. Stable mild atrophy. Mildly progressed severe chronic microvascular ischemic changes with new chronic right occipital lobe infarct. Vascular: Atherosclerotic vascular calcification of the carotid siphons. No hyperdense vessel. Skull: Normal. Negative for fracture or focal lesion. Sinuses/Orbits: No acute finding. Other: None. CT CERVICAL SPINE FINDINGS Alignment: Normal. Skull base and vertebrae: Acute comminuted nondisplaced fracture through the left lateral mass of C1 (series 5, image 22; series 9, image 39), with involvement of the transverse foramen. New severe T3 compression fracture with 70% height loss and 5 mm retropulsion. No primary bone lesion or focal pathologic process. Soft tissues and spinal canal: No prevertebral fluid or swelling. No visible canal hematoma. Disc levels: Unchanged mild disc height loss at C4-C5 and C6-C7. Unchanged moderate disc height loss and uncovertebral hypertrophy C5-C6. Upper chest:  Partially visualized right pleural effusion. Other: None. IMPRESSION: CT head: 1. No acute intracranial abnormality. 2. Mildly progressed severe chronic microvascular ischemic changes with new chronic right occipital lobe infarct. CT cervical spine: 1. Acute comminuted nondisplaced fracture through the left lateral mass of C1 with involvement of the transverse foramen. Recommend CTA of the neck to evaluate for vertebral artery injury. 2. Acute severe T3 compression fracture with 70% height loss and 5 mm retropulsion. 3. Partially visualized right pleural effusion. Electronically Signed   By: Obie Dredge M.D.   On: 08/25/2019 15:44   DG Chest Port 1 View  Result Date: 08/25/2019 CLINICAL DATA:  Fall this morning, neck pain EXAM: PORTABLE CHEST 1 VIEW COMPARISON:  08/12/2019 FINDINGS: Cardiomegaly. Unchanged small, layering pleural effusions. Probable calcified pulmonary nodule of the right midlung. The visualized skeletal structures are unremarkable. IMPRESSION: Cardiomegaly with unchanged small, layering pleural effusions. No acute appearing airspace opacity. Electronically Signed   By: Lauralyn Primes M.D.   On: 08/25/2019 15:02     Mancel Bale, MD 08/25/19 [id number]

## 2019-08-25 NOTE — Progress Notes (Signed)
Orthopedic Tech Progress Note Patient Details:  Katherine Lane 1932/11/23 [id number] Ordered outside vendor brace Patient ID: Katherine Lane, female   DOB: Jul 14, 1932, 86 y.o.   MRN: [id number]   Gerald Stabs 08/25/2019, 5:11 PM

## 2019-08-25 NOTE — ED Triage Notes (Signed)
Patient got tangled in the bed sheets this am and slid to floor, denies any direct fall. Complains of left lateral cp, takes xarelto and denies hitting head and neck. No abrasions nor bruising noted. Alert and oriented.

## 2019-08-25 NOTE — ED Provider Notes (Signed)
[facility] EMERGENCY DEPARTMENT Provider Note   CSN: [id number] Arrival date & time: 08/25/19  1009     History No chief complaint on file.   Katherine Lane is a 86 y.o. female.  Patient slipped and fell getting out of bed today.  Patient complains of neck pain.  She also states that she was put to see her family doctor about a urinary tract infection that he has been treating.  Patient ambulated into the emergency department  The history is provided by the patient and medical records. No language interpreter was used.  Fall This is a new problem. The current episode started 6 to 12 hours ago. The problem occurs rarely. The problem has not changed since onset.Pertinent negatives include no chest pain, no abdominal pain and no headaches. Nothing aggravates the symptoms. Nothing relieves the symptoms. She has tried nothing for the symptoms. The treatment provided no relief.       Past Medical History:  Diagnosis Date  . A-fib ([place])   . Anxiety   . Arthritis   . CHF (congestive heart failure) ([place])   . Chronic back pain   . Constipation   . Constipation, chronic   . Depression   . Family history of adverse reaction to anesthesia    Mother, Sister, daughter- N/V  . History of blood transfusion    "pregnancy"  . HTN (hypertension)   . Insomnia   . Migraine headache   . Osteoporosis   . PONV (postoperative nausea and vomiting)    "violent"  . Pulmonary edema 06/2016  . Sodium (Na) deficiency   . Trigeminal neuralgia     Patient Active Problem List   Diagnosis Date Noted  . Age-related osteoporosis without current pathological fracture 06/10/2019  . Hemorrhoids, external, with complication 11/09/3005  . Rectal bleeding   . Rectal pain   . Hemorrhoids, internal, with bleeding 01/06/2018  . Hypertensive urgency 12/16/2017  . Intractable episodic headache   . Visual changes   . Depression with anxiety   . Chronic diastolic congestive heart failure  ([place]) 10/31/2017  . Atypical chest pain 10/31/2017  . Compression fracture of L1 lumbar vertebra ([facility]) 02/19/2017  . Lumbar compression fracture ([place]) 12/26/2016  . Gastroesophageal reflux disease without esophagitis 07/18/2016  . Acute on chronic diastolic CHF (congestive heart failure) ([place]) 07/11/2016  . Chronic coughing 07/11/2016  . Pulmonary edema 07/10/2016  . Migraine headache 07/01/2016  . Anxiety 06/30/2016  . Altered mental status 12/06/2015  . Prolonged Q-T interval on ECG 12/06/2015  . Postural dizziness with presyncope 12/06/2015  . PAF (paroxysmal atrial fibrillation) ([place]) 07/14/2013  . Atrial flutter ([place]) 05/12/2013  . Bulging discs 04/20/2013  . Trigeminal neuralgia 01/15/2013  . Cellulitis 07/31/2011  . Mechanical complication of internal orthopedic implant ([place]) 07/21/2011  . 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 Ankle fracture 07/02/2010  . LESION OF PLANTAR NERVE 05/14/2009  . CLOSED BIMALLEOLAR FRACTURE 03/01/2009  . Essential hypertension 11/07/2008  . CONSTIPATION, CHRONIC 11/07/2008  . Osteoporosis 11/07/2008    Past Surgical History:  Procedure Laterality Date  . ANKLE FRACTURE SURGERY Right   . CARDIAC CATHETERIZATION     denies  . CATARACT EXTRACTION W/PHACO Right 07/22/2012   Procedure: CATARACT EXTRACTION PHACO AND INTRAOCULAR LENS PLACEMENT (IOC);  Surgeon: Tonny Branch, MD;  Location: [facility];  Service: Ophthalmology;  Laterality: Right;  CDE:  18.93  . CATARACT EXTRACTION W/PHACO Left 08/09/2012   Procedure: CATARACT EXTRACTION PHACO AND INTRAOCULAR LENS PLACEMENT (IOC);  Surgeon: Tonny Branch, MD;  Location: [facility];  Service: Ophthalmology;  Laterality: Left;  CDE: 17.21  . COLONOSCOPY    . FLEXIBLE SIGMOIDOSCOPY N/A 11/19/2018   Procedure: FLEXIBLE SIGMOIDOSCOPY;  Surgeon: Danie Binder, MD;  Location: [facility];  Service: Endoscopy;  Laterality: N/A;  7:30am  . HARDWARE REMOVAL  07/18/2011   Procedure: HARDWARE REMOVAL; Right  leg-  Surgeon: Carole Civil, MD;  Location: [facility];  Service: Orthopedics;  Laterality: Right;  . HEMORRHOID BANDING N/A 11/19/2018   Procedure: HEMORRHOID BANDING;  Surgeon: Danie Binder, MD;  [facility]: [facility];  Service: Endoscopy;  Laterality: N/A;  . KYPHOPLASTY N/A 12/26/2016   Procedure: Lumbar two Lumbar three and Lumbar five KYPHOPLASTY;  Surgeon: Erline Levine, MD;  Location: [facility];  Service: Neurosurgery;  Laterality: N/A;  . KYPHOPLASTY N/A 02/19/2017   Procedure: KYPHOPLASTY LUMBAR ONE;  Surgeon: Erline Levine, MD;  Location: [facility];  Service: Neurosurgery;  Laterality: N/A;  KYPHOPLASTY LUMBAR ONE  . NERVE REPAIR  07/18/2011   Procedure: NERVE REPAIR;  Surgeon: Carole Civil, MD;  Location: [facility];  Service: Orthopedics;  Laterality: Right;  Right leg superficial peroneal nerve release    . PARTIAL HYSTERECTOMY       OB History    Gravida  3   Para  3   Term  3   Preterm      AB      Living  3     SAB      TAB      Ectopic      Multiple      Live Births  3           Family History  Problem Relation Age of Onset  . Hypertension Mother   . Alzheimer's disease Mother   . Cancer Mother   . Hypertension Father   . Other Father        abused pain meds  . Diabetes Brother   . Other Brother        back problems  . Diabetes Daughter   . Anesthesia problems Neg Hx   . Hypotension Neg Hx   . Malignant hyperthermia Neg Hx   . Pseudochol deficiency Neg Hx     Social History   Tobacco Use  . Smoking status: Never Smoker  . Smokeless tobacco: Never Used  Vaping Use  . Vaping Use: Never used  Substance Use Topics  . Alcohol use: No    Alcohol/week: 0.0 standard drinks  . Drug use: No    Home Medications Prior to Admission medications   Medication Sig Start Date End Date Taking? Authorizing Provider  ciprofloxacin-dexamethasone (CIPRODEX) OTIC suspension Place 4 drops into affected ear bid Patient taking differently: Place 4 drops  into the right ear 2 (two) times daily as needed (irritation).  03/22/19  Yes Mikey Kirschner, MD  clobetasol (TEMOVATE) 0.05 % external solution APPLY TO AFFECTED AREAS TWICE DAILY. Patient taking differently: Apply 1 application topically daily as needed (psoriasis). . 12/02/17  Yes Mikey Kirschner, MD  cloNIDine (CATAPRES) 0.1 MG tablet Take 1 tablet (0.1 mg total) by mouth every evening. At 8:00 p.m. 03/28/19  Yes Herminio Commons, MD  furosemide (LASIX) 20 MG tablet Take 20 mg by mouth daily as needed (weight gain/fluid retention.).    Yes [provider]  hydrALAZINE (APRESOLINE) 50 MG tablet TAKE (1) TABLET BY MOUTH (4) TIMES DAILY. Patient taking differently: Take 50 mg by mouth  4 (four) times daily.  05/19/19  Yes Herminio Commons, MD  hydrocortisone (ANUSOL-HC) 2.5 % rectal cream APPLY RECTALLY THREE TIMES DAILY AS DIRECTED Patient taking differently: Place 1 application rectally 2 (two) times daily as needed for hemorrhoids or anal itching.  10/19/18  Yes Mikey Kirschner, MD  Lidocaine-Hydrocortisone Ace 3-2.5 % KIT APPLY TO RECTUM QID AS NEEDED FOR RECTAL PAIN OR BLEEDING Patient taking differently: Place 1 application rectally daily as needed (rectal pain or bleeding).  05/12/19  Yes Fields, Sandi L, MD  LORazepam (ATIVAN) 0.5 MG tablet TAKE ONE TABLET BY MOUTH UP TO TWICE DAILY AS NEEDED. Patient taking differently: Take 0.5 mg by mouth 2 (two) times daily as needed for anxiety or sleep. TAKE ONE TABLET BY MOUTH UP TO TWICE DAILY AS NEEDED. 07/11/19  Yes Mikey Kirschner, MD  losartan (COZAAR) 50 MG tablet Take 1 tablet (50 mg total) by mouth 2 (two) times daily. Daily at 7 AM and at 7 PM 05/11/19  Yes Herminio Commons, MD  Magnesium 250 MG TABS Take 250 mg by mouth at bedtime.    Yes [provider]  metoprolol succinate (TOPROL-XL) 100 MG 24 hr tablet Take 1 tablet (100 mg total) by mouth daily. 02/25/19  Yes Herminio Commons, MD  metoprolol tartrate  (LOPRESSOR) 25 MG tablet Take 1 tablet (25 mg total) by mouth daily as needed (palpitations). 02/25/19  Yes Herminio Commons, MD  Multiple Vitamin (MULTIVITAMIN WITH MINERALS) TABS tablet Take 1 tablet by mouth daily. Centrum Silver   Yes [provider]  Multiple Vitamins-Minerals (PRESERVISION AREDS 2 PO) Take 1 tablet by mouth 2 (two) times daily.   Yes [provider]  Polyethyl Glycol-Propyl Glycol (LUBRICANT EYE DROPS) 0.4-0.3 % SOLN Place 1 drop into both eyes at bedtime.   Yes [provider]  polyethylene glycol powder (MIRALAX) powder Take 17 g daily as needed by mouth for moderate constipation.    Yes [provider]  potassium chloride (KLOR-CON) 10 MEQ tablet Take 1 tablet (10 mEq total) by mouth every other day. 07/29/19  Yes Herminio Commons, MD  shark liver oil-cocoa butter (PREPARATION H) 0.25-3-85.5 % suppository Place 1 suppository rectally 2 (two) times daily as needed for hemorrhoids.    Yes [provider]  sulfamethoxazole-trimethoprim (BACTRIM DS) 800-160 MG tablet Take 1 tablet by mouth 2 (two) times daily. 08/19/19  Yes Luking, Scott A, MD  XARELTO 20 MG TABS tablet TAKE 1 TABLET DAILY WITH SUPPER. Patient taking differently: Take 20 mg by mouth daily with supper.  03/22/19  Yes Herminio Commons, MD  zolpidem (AMBIEN) 10 MG tablet TAKE 1 TABLET BY MOUTH AT BEDTIME FOR SLEEP. Patient taking differently: Take 10 mg by mouth at bedtime. TAKE 1 TABLET BY MOUTH AT BEDTIME FOR SLEEP. 06/20/19  Yes Mikey Kirschner, MD    Allergies    Amlodipine, Codeine, Other, and Tramadol  Review of Systems   Review of Systems  Constitutional: Negative for appetite change and fatigue.  HENT: Negative for congestion, ear discharge and sinus pressure.        Neck pain  Eyes: Negative for discharge.  Respiratory: Negative for cough.   Cardiovascular: Negative for chest pain.  Gastrointestinal: Negative for abdominal pain and diarrhea.   Genitourinary: Negative for frequency and hematuria.  Musculoskeletal: Negative for back pain.  Skin: Negative for rash.  Neurological: Negative for seizures and headaches.  Psychiatric/Behavioral: Negative for hallucinations.    Physical Exam Updated Vital  Signs BP (!) 172/102 (BP Location: Left Arm)   Pulse (!) 101   Temp 97.7 F (36.5 C) (Oral)   Resp 15   Ht '5\' 3"'  (1.6 m)   Wt 55.8 kg   SpO2 91%   BMI 21.79 kg/m   Physical Exam Vitals and nursing note reviewed.  Constitutional:      Appearance: She is well-developed.  HENT:     Head: Normocephalic.     Nose: Nose normal.  Eyes:     General: No scleral icterus.    Conjunctiva/sclera: Conjunctivae normal.  Neck:     Thyroid: No thyromegaly.     Comments: Tender posterior neck Cardiovascular:     Rate and Rhythm: Normal rate.     Heart sounds: No murmur heard.  No friction rub. No gallop.   Pulmonary:     Breath sounds: No stridor. No wheezing or rales.  Chest:     Chest wall: No tenderness.  Abdominal:     General: There is no distension.     Tenderness: There is no abdominal tenderness. There is no rebound.  Musculoskeletal:        General: Normal range of motion.  Lymphadenopathy:     Cervical: No cervical adenopathy.  Skin:    Findings: No erythema or rash.  Neurological:     Mental Status: She is alert and oriented to person, place, and time.     Motor: No abnormal muscle tone.     Coordination: Coordination normal.     Comments: Patient has normal strength and sensation upper and lower extremities  Psychiatric:        Behavior: Behavior normal.     ED Results / Procedures / Treatments   Labs (all labs ordered are listed, but only abnormal results are displayed) Labs Reviewed  COMPREHENSIVE METABOLIC PANEL - Abnormal; Notable for the following components:      Result Value   Sodium 131 (*)    Glucose, Bld 108 (*)    Calcium 8.7 (*)    Albumin 3.2 (*)    GFR calc non Af Amer 54 (*)    All  other components within normal limits  URINE CULTURE  CBC WITH DIFFERENTIAL/PLATELET  LACTIC ACID, PLASMA  URINALYSIS, ROUTINE W REFLEX MICROSCOPIC    EKG None  Radiology CT Head Wo Contrast  Result Date: 08/25/2019 CLINICAL DATA:  Slid out of bed this morning. Denies direct fall or hitting head/neck. EXAM: CT HEAD WITHOUT CONTRAST CT CERVICAL SPINE WITHOUT CONTRAST TECHNIQUE: Multidetector CT imaging of the head and cervical spine was performed following the standard protocol without intravenous contrast. Multiplanar CT image reconstructions of the cervical spine were also generated. COMPARISON:  Chest x-ray dated August 12, 2019. CT head dated December 16, 2016. CT cervical spine dated December 06, 2015. FINDINGS: CT HEAD FINDINGS Brain: No evidence of acute infarction, hemorrhage, hydrocephalus, extra-axial collection or mass lesion/mass effect. Stable mild atrophy. Mildly progressed severe chronic microvascular ischemic changes with new chronic right occipital lobe infarct. Vascular: Atherosclerotic vascular calcification of the carotid siphons. No hyperdense vessel. Skull: Normal. Negative for fracture or focal lesion. Sinuses/Orbits: No acute finding. Other: None. CT CERVICAL SPINE FINDINGS Alignment: Normal. Skull base and vertebrae: Acute comminuted nondisplaced fracture through the left lateral mass of C1 (series 5, image 22; series 9, image 39), with involvement of the transverse foramen. New severe T3 compression fracture with 70% height loss and 5 mm retropulsion. No primary bone lesion or focal pathologic process. Soft tissues and spinal  canal: No prevertebral fluid or swelling. No visible canal hematoma. Disc levels: Unchanged mild disc height loss at C4-C5 and C6-C7. Unchanged moderate disc height loss and uncovertebral hypertrophy C5-C6. Upper chest: Partially visualized right pleural effusion. Other: None. IMPRESSION: CT head: 1. No acute intracranial abnormality. 2. Mildly progressed severe  chronic microvascular ischemic changes with new chronic right occipital lobe infarct. CT cervical spine: 1. Acute comminuted nondisplaced fracture through the left lateral mass of C1 with involvement of the transverse foramen. Recommend CTA of the neck to evaluate for vertebral artery injury. 2. Acute severe T3 compression fracture with 70% height loss and 5 mm retropulsion. 3. Partially visualized right pleural effusion. Electronically Signed   By: Titus Dubin M.D.   On: 08/25/2019 15:44   CT Cervical Spine Wo Contrast  Result Date: 08/25/2019 CLINICAL DATA:  Slid out of bed this morning. Denies direct fall or hitting head/neck. EXAM: CT HEAD WITHOUT CONTRAST CT CERVICAL SPINE WITHOUT CONTRAST TECHNIQUE: Multidetector CT imaging of the head and cervical spine was performed following the standard protocol without intravenous contrast. Multiplanar CT image reconstructions of the cervical spine were also generated. COMPARISON:  Chest x-ray dated August 12, 2019. CT head dated December 16, 2016. CT cervical spine dated December 06, 2015. FINDINGS: CT HEAD FINDINGS Brain: No evidence of acute infarction, hemorrhage, hydrocephalus, extra-axial collection or mass lesion/mass effect. Stable mild atrophy. Mildly progressed severe chronic microvascular ischemic changes with new chronic right occipital lobe infarct. Vascular: Atherosclerotic vascular calcification of the carotid siphons. No hyperdense vessel. Skull: Normal. Negative for fracture or focal lesion. Sinuses/Orbits: No acute finding. Other: None. CT CERVICAL SPINE FINDINGS Alignment: Normal. Skull base and vertebrae: Acute comminuted nondisplaced fracture through the left lateral mass of C1 (series 5, image 22; series 9, image 39), with involvement of the transverse foramen. New severe T3 compression fracture with 70% height loss and 5 mm retropulsion. No primary bone lesion or focal pathologic process. Soft tissues and spinal canal: No prevertebral fluid or  swelling. No visible canal hematoma. Disc levels: Unchanged mild disc height loss at C4-C5 and C6-C7. Unchanged moderate disc height loss and uncovertebral hypertrophy C5-C6. Upper chest: Partially visualized right pleural effusion. Other: None. IMPRESSION: CT head: 1. No acute intracranial abnormality. 2. Mildly progressed severe chronic microvascular ischemic changes with new chronic right occipital lobe infarct. CT cervical spine: 1. Acute comminuted nondisplaced fracture through the left lateral mass of C1 with involvement of the transverse foramen. Recommend CTA of the neck to evaluate for vertebral artery injury. 2. Acute severe T3 compression fracture with 70% height loss and 5 mm retropulsion. 3. Partially visualized right pleural effusion. Electronically Signed   By: Titus Dubin M.D.   On: 08/25/2019 15:44   DG Chest Port 1 View  Result Date: 08/25/2019 CLINICAL DATA:  Fall this morning, neck pain EXAM: PORTABLE CHEST 1 VIEW COMPARISON:  08/12/2019 FINDINGS: Cardiomegaly. Unchanged small, layering pleural effusions. Probable calcified pulmonary nodule of the right midlung. The visualized skeletal structures are unremarkable. IMPRESSION: Cardiomegaly with unchanged small, layering pleural effusions. No acute appearing airspace opacity. Electronically Signed   By: Eddie Candle M.D.   On: 08/25/2019 15:02    Procedures Procedures (including critical care time)  Medications Ordered in ED Medications  sodium chloride 0.9 % bolus 500 mL (500 mLs Intravenous New Bag/Given 08/25/19 1531)  piperacillin-tazobactam (ZOSYN) IVPB 3.375 g (3.375 g Intravenous New Bag/Given 08/25/19 1539)  HYDROmorphone (DILAUDID) injection 0.5 mg (0.5 mg Intravenous Given 08/25/19 1538)  prochlorperazine (COMPAZINE) injection 5  mg (5 mg Intravenous Given 08/25/19 1539)    ED Course  I have reviewed the triage vital signs and the nursing notes.  Pertinent labs & imaging results that were available during my care of the  patient were reviewed by me and considered in my medical decision making (see chart for details).  Clinical Course as of Aug 28 1104  Thu Aug 25, 2019  2225 Call returned by Dr. Gwenlyn Saran, regarding vertebral artery injury, grade 1 with fracture cervical spine at C1.  He will evaluate the patient in the ED and anticipates prophylactic treatment with aspirin, and expectant management.   [EW]    Clinical Course User Index [EW] Daleen Bo, MD   MDM Rules/Calculators/A&P                          Patient with a C1 fracture along with a T3 fracture.  Neurosurgery consult        This patient presents to the ED for concern of neck pain, this involves an extensive number of treatment options, and is a complaint that carries with it a high risk of complications and morbidity.  The differential diagnosis includes cervical fracture   Lab Tests:   I Ordered, reviewed, and interpreted labs, which included CBC and chemistries which showed low sodium  Medicines ordered:   I ordered medication Dilaudid for pain  Imaging Studies ordered:   I ordered imaging studies which included CT head cervical spine and  I independently visualized and interpreted imaging which showed cervical fracture  Additional history obtained:   Additional history obtained from daughter  Previous records obtained and reviewed.  Consultations Obtained:   I consulted neurosurgery and discussed lab and imaging findings  Reevaluation:  After the interventions stated above, I reevaluated the patient and found some improved  Critical Interventions:  .   Final Clinical Impression(s) / ED Diagnoses Final diagnoses:  None    Rx / DC Orders ED Discharge Orders    None       Milton Ferguson, MD 08/29/19 1108

## 2019-08-26 ENCOUNTER — Telehealth: Payer: Self-pay | Admitting: Family Medicine

## 2019-08-26 LAB — URINE CULTURE: Culture: NO GROWTH

## 2019-08-26 NOTE — Telephone Encounter (Signed)
Daughter (DPR) scheduled office visit for ER follow up on Monday. Daughter just wanted to make sure he reviewed everything and let Dr Lorin Picket know she is awaiting call from neurosurgeon and if the need her seen on Monday she will call and reschedule for another day in the week.

## 2019-08-26 NOTE — Telephone Encounter (Signed)
Nurses Please reach out to the daughter [name] Please make her aware that Dr. Lorin Picket is aware of the ER visits, scans, notes. Purpose of the call is twofold #1 to set up follow-up #2 to see if there are any needs they have currently  I would recommend consultation with [place] next week preferably at 11 AM or 1130 depending on my schedule.  Looking at next week Monday Wednesday or Thursday looks most doable  Also let me know if they have any pressing or current needs.

## 2019-08-29 ENCOUNTER — Ambulatory Visit: Payer: Medicare PPO | Admitting: Family Medicine

## 2019-08-29 ENCOUNTER — Other Ambulatory Visit: Payer: Self-pay

## 2019-08-29 ENCOUNTER — Telehealth: Payer: Self-pay | Admitting: Family Medicine

## 2019-08-29 VITALS — Temp 97.0°F | Ht 62.0 in | Wt 123.0 lb

## 2019-08-29 DIAGNOSIS — S1201XD Stable burst fracture of first cervical vertebra, subsequent encounter for fracture with routine healing: Secondary | ICD-10-CM

## 2019-08-29 DIAGNOSIS — M542 Cervicalgia: Secondary | ICD-10-CM | POA: Diagnosis not present

## 2019-08-29 DIAGNOSIS — F32A Depression, unspecified: Secondary | ICD-10-CM

## 2019-08-29 DIAGNOSIS — W06XXXD Fall from bed, subsequent encounter: Secondary | ICD-10-CM | POA: Diagnosis not present

## 2019-08-29 DIAGNOSIS — F329 Major depressive disorder, single episode, unspecified: Secondary | ICD-10-CM

## 2019-08-29 DIAGNOSIS — S22000D Wedge compression fracture of unspecified thoracic vertebra, subsequent encounter for fracture with routine healing: Secondary | ICD-10-CM

## 2019-08-29 DIAGNOSIS — F419 Anxiety disorder, unspecified: Secondary | ICD-10-CM | POA: Diagnosis not present

## 2019-08-29 MED ORDER — SERTRALINE HCL 25 MG PO TABS: 25.0000 mg | ORAL_TABLET | Freq: Every day | ORAL | 2 refills | Status: DC

## 2019-08-29 MED ORDER — ZOLPIDEM TARTRATE 5 MG PO TABS: ORAL_TABLET | ORAL | 2 refills | Status: DC

## 2019-08-29 NOTE — Progress Notes (Addendum)
   Subjective:    Patient ID: Katherine Lane, female    DOB: 10-17-1932, 86 y.o.   MRN: [id number]  HPI Very nice patient she is here with her daughter [name] Patient arrives for a follow up from a recent ER visit for cervical fracture s/p fall. Patient with recent fall Struck her neck and her back Has a compression fracture of thoracic spine Has cervical C1 fracture Family and patient feel that she is currently overwhelmed and at times anxious about this The patient has requested to take her nerve medication 3 times daily We had good discussion about anxiety and depression Patient relates her pain is under decent control Wearing the hard brace consistently Will be seeing her neurosurgeon in about 3 weeks Has difficult time meeting her needs at home family is helping her out ER labs, cultures, CAT scans, CTA were all reviewed in detail with the family Review of Systems Please see above some nausea some anxiousness    Objective:   Physical Exam  Has hard brace on around the neck it does appear to be properly in place patient can stand and walk with a cane minor ataxia      Assessment & Plan:  I believe the patient would benefit from home health as well as occupational therapy Once her neck recovers physical therapy could be beneficial as well Patient would benefit from bone density but we will do this later in August She does have significant anxiety related issues but I have encouraged her not to increase her nerve pill stick with only 2/day As for her Ambien she has been on 10 mg at nighttime for a long time I have told her that it is important to reduce this to 5 mg Ideally I would have her off of this medicine but very tearful states she does not feel like she can tolerate too many changes I do believe that she has some element of depression with anxiety low-dose sertraline 25 mg daily would be beneficial Close follow-up Recheck next week Then we will do a follow-up in August  as well She will see pulmonary for pleural effusions in August She will see neurosurgery in August Patient may well also benefit from infusion of reclast but we will discuss this on her next visit I recommend close monitoring Patient also has evidence of old infarction we did discuss this She is already on blood thinner

## 2019-08-29 NOTE — Telephone Encounter (Signed)
Daughter is requesting [facility] for therapy if the patient's insurance will over. [phone]

## 2019-08-30 LAB — CULTURE, BLOOD (ROUTINE X 2)
Culture: NO GROWTH
Culture: NO GROWTH
Special Requests: ADEQUATE

## 2019-09-01 ENCOUNTER — Telehealth: Payer: Self-pay | Admitting: Family Medicine

## 2019-09-01 NOTE — Telephone Encounter (Signed)
Please advise. Thank you

## 2019-09-01 NOTE — Telephone Encounter (Signed)
April with [facility] called and needs a verbal on  1 x 2weeks nursing 1 x 5 weeks Home health aide  And a social work consult   Call back number [phone] April

## 2019-09-02 NOTE — Telephone Encounter (Signed)
Verbal orders given to April.  

## 2019-09-02 NOTE — Telephone Encounter (Addendum)
Left message to return call with [facility]

## 2019-09-02 NOTE — Telephone Encounter (Signed)
Please go ahead and give thank you

## 2019-09-06 ENCOUNTER — Telehealth: Payer: Self-pay | Admitting: Family Medicine

## 2019-09-06 ENCOUNTER — Encounter: Payer: Self-pay | Admitting: Family Medicine

## 2019-09-06 ENCOUNTER — Other Ambulatory Visit: Payer: Self-pay

## 2019-09-06 ENCOUNTER — Ambulatory Visit: Payer: Medicare PPO | Admitting: Family Medicine

## 2019-09-06 VITALS — BP 134/82 | Temp 97.0°F | Wt 124.2 lb

## 2019-09-06 DIAGNOSIS — M810 Age-related osteoporosis without current pathological fracture: Secondary | ICD-10-CM | POA: Diagnosis not present

## 2019-09-06 DIAGNOSIS — J90 Pleural effusion, not elsewhere classified: Secondary | ICD-10-CM | POA: Diagnosis not present

## 2019-09-06 DIAGNOSIS — E871 Hypo-osmolality and hyponatremia: Secondary | ICD-10-CM

## 2019-09-06 DIAGNOSIS — I480 Paroxysmal atrial fibrillation: Secondary | ICD-10-CM | POA: Diagnosis not present

## 2019-09-06 DIAGNOSIS — S1201XD Stable burst fracture of first cervical vertebra, subsequent encounter for fracture with routine healing: Secondary | ICD-10-CM

## 2019-09-06 NOTE — Telephone Encounter (Signed)
Please give verbal

## 2019-09-06 NOTE — Telephone Encounter (Signed)
Malorey from [facility] needs verbal   OT 2x week for 3 weeks  1x week for 3 weeks

## 2019-09-06 NOTE — Telephone Encounter (Signed)
Verbal orders given to [facility] office. Did not have a number to call mallory back on.

## 2019-09-06 NOTE — Progress Notes (Signed)
   Subjective:    Patient ID: Katherine Lane, female    DOB: [birth year], 86 y.o.   MRN: [id number]  HPI Pt here for follow up. Pt daughter Katherine Lane here with patient. Daughter has FMLA papers to fill out. Home health main nurse came last week and will come once a week.   OT came on Friday last week. PT is coming this afternoon. Pt also will be receiving aide about once a week.   Pt seen Dr.Stern yesterday and was fitted for new brace due to the one she has now is not the correct one. Goes back to Dr.Stern on August 4th 2021.    Review of Systems     Objective:   Physical Exam        Assessment & Plan:  1. Closed stable burst fracture of first cervical vertebra with routine healing, subsequent encounter Follow-up with Dr. Venetia Maxon Continue to wear the brace Fall prevention discussed   2. Pleural effusion Follow-up with pulmonary lungs sound good today I do not feel there is any progression  3. PAF (paroxysmal atrial fibrillation) (HCC) Heart rate under good control patient relates that her cardiologist recently left and she will need a new cardiologist - Basic Metabolic Panel (BMET)  4. Hyponatremia Sodium was low on last blood work she will get this completed within the next 2 weeks - Basic Metabolic Panel (BMET)  5. Osteoporosis, unspecified osteoporosis type, unspecified pathological fracture presence Bone density to be done in August - DG Bone Density Patient to follow-up for further discussion later in August after seeing pulmonary cardiology and neurosurgery  FMLA filled out for her daughter so she can stay with her mom when going to doctor appointments etc.

## 2019-09-06 NOTE — Patient Instructions (Signed)
Please schedule office visit between 8/12-8/28

## 2019-09-08 ENCOUNTER — Telehealth: Payer: Self-pay | Admitting: Family Medicine

## 2019-09-08 NOTE — Telephone Encounter (Signed)
May have verbal order °

## 2019-09-08 NOTE — Telephone Encounter (Signed)
[facility] -Katherine Lane needing verbal ordering for physical therapy one week for 3 and two week for 4. Katherine Lane-[phone]

## 2019-09-08 NOTE — Telephone Encounter (Signed)
Katherine Lane notified.

## 2019-09-08 NOTE — Telephone Encounter (Signed)
Lmtc

## 2019-09-13 ENCOUNTER — Ambulatory Visit (INDEPENDENT_AMBULATORY_CARE_PROVIDER_SITE_OTHER): Payer: Medicare PPO | Admitting: Family Medicine

## 2019-09-13 ENCOUNTER — Encounter: Payer: Self-pay | Admitting: Family Medicine

## 2019-09-13 ENCOUNTER — Telehealth: Payer: Self-pay | Admitting: Family Medicine

## 2019-09-13 ENCOUNTER — Other Ambulatory Visit: Payer: Self-pay

## 2019-09-13 VITALS — BP 120/68 | HR 70 | Temp 97.3°F | Wt 125.6 lb

## 2019-09-13 DIAGNOSIS — R8290 Unspecified abnormal findings in urine: Secondary | ICD-10-CM | POA: Diagnosis not present

## 2019-09-13 DIAGNOSIS — S22000D Wedge compression fracture of unspecified thoracic vertebra, subsequent encounter for fracture with routine healing: Secondary | ICD-10-CM

## 2019-09-13 DIAGNOSIS — R195 Other fecal abnormalities: Secondary | ICD-10-CM

## 2019-09-13 LAB — POCT URINALYSIS DIPSTICK
Spec Grav, UA: 1.015 (ref 1.010–1.025)
pH, UA: 5 (ref 5.0–8.0)

## 2019-09-13 NOTE — Patient Instructions (Addendum)
Dehydration, Elderly Dehydration is a condition in which there is not enough water or other fluids in the body. This happens when a person loses more fluids than he or she takes in. Important organs, such as the kidneys, brain, and heart, cannot function without a proper amount of fluids. Any loss of fluids from the body can lead to dehydration. People 84 years of age or older have a higher risk of dehydration than younger adults. This is because in older age, the body:  Is less able to maintain the right amount of water.  Does not respond to temperature changes as well.  Does not get a sense of thirst as easily or quickly. Dehydration can be mild, moderate, or severe. It should be treated right away to prevent it from becoming severe. What are the causes? Dehydration may be caused by:  Conditions that cause loss of water or other fluids, such as diarrhea, vomiting, or sweating or urinating a lot.  Not drinking enough fluids, especially when you are ill or doing activities that require a lot of energy.  Other illnesses and conditions, such as fever or infection.  Certain medicines, such as medicines that remove excess fluid from the body (diuretics).  Lack of safe drinking water.  Not being able to get enough water and food. What increases the risk? This condition is more likely to develop in people who:  Have a long-term (chronic) illness, such as: ? An illness that may cause you to urinate more, such as diabetes. ? Kidney, heart, or lung disease that have not been treated properly. ? A disease of the brain and nervous system or a disorder that affects your thinking and emotions, such as dementia.  Are 65 years or older.  Have a disability.  Live in a place that is high in altitude, where thinner, drier air causes you to have more fluid loss. What are the signs or symptoms? Symptoms of dehydration depend on how severe it is. Mild or moderate dehydration  Thirst.  Dry lips  or dry mouth.  Dizziness or light-headedness, especially when standing up from a seated position.  Muscle cramps.  Dark urine. Urine may be the color of tea.  Less urine or tears produced than usual.  Headache. Severe dehydration  Changes in skin. Your skin may be cold and clammy, blotchy, or pale. Your skin also may not return to normal after being lightly pinched and released.  Little or no tears, urine, or sweat.  Changes in vital signs, such as rapid breathing and low blood pressure. Your pulse may be weak or may be faster than 100 beats a minute when you are sitting still.  Other changes, such as: ? Feeling very thirsty. ? Sunken eyes. ? Cold hands and feet. ? Confusion. ? Being very tired (lethargic) or having trouble waking from sleep. ? Short-term weight loss. ? Loss of consciousness. How is this diagnosed? This condition is diagnosed based on your symptoms and a physical exam. Blood and urine tests may help confirm the diagnosis. How is this treated? Treatment for this condition depends on how severe it is. Treatment should be started right away. Do not wait until dehydration becomes severe. Severe dehydration is an emergency and needs to be treated in a hospital.  Mild or moderate dehydration can often be treated at home. You may be asked to: ? Drink more fluids. ? Drink an oral rehydration solution (ORS). This drink helps restore proper amounts of fluids and salts and minerals in the  blood (electrolytes).  Severe dehydration can be treated: ? With IV fluids. ? By correcting abnormal levels of electrolytes. This is often done by giving electrolytes through a tube that is passed through your nose and into your stomach (nasogastric tube, or NG tube). ? By treating the underlying cause of dehydration. Follow these instructions at home: Oral rehydration solution If told by your health care provider, drink an ORS:  Make an ORS by following instructions on the  package.  Start by drinking small amounts, about  cup (120 mL) every 5-10 minutes.  Slowly increase how much you drink until you have taken the amount recommended by your health care provider. Eating and drinking         Drink enough clear fluid to keep your urine pale yellow. If you were told to drink an ORS, finish the ORS first and then start slowly drinking other clear fluids. Drink fluids such as: ? Water. Do not drink only water. Doing that can lead to hyponatremia, which is having too little salt (sodium) in the body. ? Water from ice chips you suck on. ? Fruit juice that you have added water to (diluted fruit juice). ? Low-calorie sports drinks.  Eat foods that contain a healthy balance of electrolytes, such as bananas, oranges, potatoes, tomatoes, and spinach.  Do not drink alcohol.  Avoid the following: ? Drinks that contain a lot of sugar. These include high-calorie sports drinks, fruit juice that is not diluted, and soda. ? Caffeine. ? Foods that are greasy or contain a lot of fat or sugar. General instructions  Take over-the-counter and prescription medicines only as told by your health care provider.  Do not take sodium tablets. This can lead to having too much sodium in the body (hypernatremia).  Return to your normal activities as told by your health care provider. Ask your health care provider what activities are safe for you.  Keep all follow-up visits as told by your health care provider. This is important. Contact a health care provider if you:  Have pain in your abdomen and the pain gets worse or stays in one area (localizes).  Have a rash.  Have a stiff neck.  Are more irritable than usual.  Are sleepier or have a harder time waking than usual.  Feel weak or dizzy.  Are very thirsty. Get help right away if you have:  Any symptoms of severe dehydration.  A fever.  A severe headache.  Symptoms of vomiting, such as: ? Your vomiting gets  worse or does not go away. ? Your vomit includes blood or green matter (bile). ? You cannot eat or drink without vomiting.  Problems with urination or bowel movements, such as: ? Diarrhea that gets worse or does not go away. ? Blood in your stool (feces). This may cause stool to look black and tarry. ? Not urinating, or urinating only a small amount of very dark urine, within 6-8 hours.  Trouble breathing.  Symptoms that get worse with treatment. These symptoms may represent a serious problem that is an emergency. Do not wait to see if the symptoms will go away. Get medical help right away. Call your local emergency services (911 in the [place] Do not drive yourself to the hospital. Summary  Dehydration is a condition in which there is not enough water or other fluids in the body. This happens when a person loses more fluids than he or she takes in.  Treatment for this condition depends on the severity.  Treatment should be started right away. Do not wait until dehydration becomes severe.  Drink enough clear fluid to keep your urine pale yellow. If you were told to drink an oral rehydration solution (ORS), finish the ORS first and then start slowly drinking other clear fluids.  Take over-the-counter and prescription medicines only as told by your health care provider.  Get help right away if you have any symptoms of severe dehydration. This information is not intended to replace advice given to you by your health care provider. Make sure you discuss any questions you have with your health care provider. Document Revised: 09/16/2018 Document Reviewed: 09/16/2018 Elsevier Patient Education  2020 [employer].  [place] Diet A bland diet consists of foods that are often soft and do not have a lot of fat, fiber, or extra seasonings. Foods without fat, fiber, or seasoning are easier for the body to digest. They are also less likely to irritate your mouth, throat, stomach, and other parts of your  digestive system. A bland diet is sometimes called a BRAT diet. What is my plan? Your health care provider or food and nutrition specialist (dietitian) may recommend specific changes to your diet to prevent symptoms or to treat your symptoms. These changes may include:  Eating small meals often.  Cooking food until it is soft enough to chew easily.  Chewing your food well.  Drinking fluids slowly.  Not eating foods that are very spicy, sour, or fatty.  Not eating citrus fruits, such as oranges and grapefruit. What do I need to know about this diet?  Eat a variety of foods from the bland diet food list.  Do not follow a bland diet longer than needed.  Ask your health care provider whether you should take vitamins or supplements. What foods can I eat? Grains  Hot cereals, such as cream of wheat. Rice. Bread, crackers, or tortillas made from refined white flour. Vegetables Canned or cooked vegetables. Mashed or boiled potatoes. Fruits  Bananas. Applesauce. Other types of cooked or canned fruit with the skin and seeds removed, such as canned peaches or pears. Meats and other proteins  Scrambled eggs. Creamy peanut butter or other nut butters. Lean, well-cooked meats, such as chicken or fish. Tofu. Soups or broths. Dairy Low-fat dairy products, such as milk, cottage cheese, or yogurt. Beverages  Water. Herbal tea. Apple juice. Fats and oils Mild salad dressings. Canola or olive oil. Sweets and desserts Pudding. Custard. Fruit gelatin. Ice cream. The items listed above may not be a complete list of recommended foods and beverages. Contact a dietitian for more options. What foods are not recommended? Grains Whole grain breads and cereals. Vegetables Raw vegetables. Fruits Raw fruits, especially citrus, berries, or dried fruits. Dairy Whole fat dairy foods. Beverages Caffeinated drinks. Alcohol. Seasonings and condiments Strongly flavored seasonings or condiments. Hot  sauce. Salsa. Other foods Spicy foods. Fried foods. Sour foods, such as pickled or fermented foods. Foods with high sugar content. Foods high in fiber. The items listed above may not be a complete list of foods and beverages to avoid. Contact a dietitian for more information. Summary  A bland diet consists of foods that are often soft and do not have a lot of fat, fiber, or extra seasonings.  Foods without fat, fiber, or seasoning are easier for the body to digest.  Check with your health care provider to see how long you should follow this diet plan. It is not meant to be followed for long periods. This  information is not intended to replace advice given to you by your health care provider. Make sure you discuss any questions you have with your health care provider. Document Revised: 03/04/2017 Document Reviewed: 03/04/2017 Elsevier Patient Education  2020 [employer].

## 2019-09-13 NOTE — Telephone Encounter (Signed)
So noted 

## 2019-09-13 NOTE — Progress Notes (Signed)
Patient ID: Katherine Lane, female    DOB: 25-Apr-1932, 86 y.o.   MRN: [id number]   Chief Complaint  Patient presents with  . Dehydration    abnormal urine odor   Subjective:    HPI  Pt here for possible UTI. Pt has had dark urine and strong odor. Pt states that her daughters and the physical therapist was saying she may be dehydrated. No burning or painful urination. Daughter mentioned it yesterday morning. Reports loose stools several times yesterday- has resolved today. None today. Denies abd. Pain having gas only today. Had fall on 7/8 and fractured several vertebrae-due to this patient has had pain and nausea medicine and last dose was last week (half dose). Taking Tylenol for pain.Pt has neck color in place and thoracic brace in place.  Seeing Dr. Vertell Limber for the fractures.  And only taking tylenol prn.  Has stronger pain medications, but not wanting to take them.     Medical History Katherine Lane has a past medical history of A-fib ([place]), Anxiety, Arthritis, CHF (congestive heart failure) ([place]), Chronic back pain, Constipation, Constipation, chronic, Depression, Family history of adverse reaction to anesthesia, History of blood transfusion, HTN (hypertension), Insomnia, Migraine headache, Osteoporosis, PONV (postoperative nausea and vomiting), Pulmonary edema (06/2016), Sodium (Na) deficiency, and Trigeminal neuralgia.   Outpatient Encounter Medications as of 09/13/2019  Medication Sig  . ciprofloxacin-dexamethasone (CIPRODEX) OTIC suspension Place 4 drops into affected ear bid (Patient taking differently: Place 4 drops into the right ear 2 (two) times daily as needed (irritation). )  . clobetasol (TEMOVATE) 0.05 % external solution APPLY TO AFFECTED AREAS TWICE DAILY. (Patient taking differently: Apply 1 application topically daily as needed (psoriasis). Marland Kitchen)  . cloNIDine (CATAPRES) 0.1 MG tablet Take 1 tablet (0.1 mg total) by mouth every evening. At 8:00 p.m.  Marland Kitchen furosemide (LASIX) 20 MG  tablet Take 20 mg by mouth daily as needed (weight gain/fluid retention.).   Marland Kitchen hydrALAZINE (APRESOLINE) 50 MG tablet TAKE (1) TABLET BY MOUTH (4) TIMES DAILY. (Patient taking differently: Take 50 mg by mouth 4 (four) times daily. )  . HYDROcodone-acetaminophen (NORCO) 5-325 MG tablet Take 1 tablet by mouth every 6 (six) hours as needed for moderate pain.  . hydrocortisone (ANUSOL-HC) 2.5 % rectal cream APPLY RECTALLY THREE TIMES DAILY AS DIRECTED (Patient taking differently: Place 1 application rectally 2 (two) times daily as needed for hemorrhoids or anal itching. )  . Lidocaine-Hydrocortisone Ace 3-2.5 % KIT APPLY TO RECTUM QID AS NEEDED FOR RECTAL PAIN OR BLEEDING (Patient taking differently: Place 1 application rectally daily as needed (rectal pain or bleeding). )  . LORazepam (ATIVAN) 0.5 MG tablet TAKE ONE TABLET BY MOUTH UP TO TWICE DAILY AS NEEDED. (Patient taking differently: Take 0.5 mg by mouth 2 (two) times daily as needed for anxiety or sleep. TAKE ONE TABLET BY MOUTH UP TO TWICE DAILY AS NEEDED.)  . losartan (COZAAR) 50 MG tablet Take 1 tablet (50 mg total) by mouth 2 (two) times daily. Daily at 7 AM and at 7 PM  . Magnesium 250 MG TABS Take 250 mg by mouth at bedtime.   . metoprolol succinate (TOPROL-XL) 100 MG 24 hr tablet Take 1 tablet (100 mg total) by mouth daily.  . metoprolol tartrate (LOPRESSOR) 25 MG tablet Take 1 tablet (25 mg total) by mouth daily as needed (palpitations).  . Multiple Vitamin (MULTIVITAMIN WITH MINERALS) TABS tablet Take 1 tablet by mouth daily. Centrum Silver  . Multiple Vitamins-Minerals (PRESERVISION AREDS 2 PO) Take 1  tablet by mouth 2 (two) times daily.  Katherine Lane Glycol-Propyl Glycol (LUBRICANT EYE DROPS) 0.4-0.3 % SOLN Place 1 drop into both eyes at bedtime.  . polyethylene glycol powder (MIRALAX) powder Take 17 g daily as needed by mouth for moderate constipation.   . potassium chloride (KLOR-CON) 10 MEQ tablet Take 1 tablet (10 mEq total) by mouth  every other day.  . prochlorperazine (COMPAZINE) 5 MG tablet Take 2 tablets (10 mg total) by mouth every 8 (eight) hours as needed for nausea or vomiting.  . sertraline (ZOLOFT) 25 MG tablet Take 1 tablet (25 mg total) by mouth daily.  . shark liver oil-cocoa butter (PREPARATION H) 0.25-3-85.5 % suppository Place 1 suppository rectally 2 (two) times daily as needed for hemorrhoids.   Marland Kitchen sulfamethoxazole-trimethoprim (BACTRIM DS) 800-160 MG tablet Take 1 tablet by mouth 2 (two) times daily.  Katherine Lane 20 MG TABS tablet TAKE 1 TABLET DAILY WITH SUPPER. (Patient taking differently: Take 20 mg by mouth daily with supper. )  . zolpidem (AMBIEN) 5 MG tablet TAKE 1 TABLET BY MOUTH AT BEDTIME FOR SLEEP.   No facility-administered encounter medications on file as of 09/13/2019.     Review of Systems  Constitutional: Negative for chills and fever.  Gastrointestinal: Positive for constipation.  Genitourinary: Negative for difficulty urinating, dysuria, frequency, hematuria and urgency.  Daughter has been with patient today and noted she went to bathroom to urinate only twice before this visit. Daughter reports has had gatorade, but patient reports she usually drinks 2 16 oz bottles of water daily.   Vitals BP 120/68   Pulse 70   Temp (!) 97.3 F (36.3 C)   Wt 125 lb 9.6 oz (57 kg)   SpO2 98%   BMI 22.97 kg/m   Objective:   Physical Exam Constitutional:      Appearance: Normal appearance.  HENT:     Head: Normocephalic.  Cardiovascular:     Rate and Rhythm: Normal rate. Rhythm irregular.     Pulses: Normal pulses.  Pulmonary:     Effort: Pulmonary effort is normal.     Breath sounds: Normal breath sounds.  Neurological:     Mental Status: She is alert.    Assessment and Plan   1. Abnormal urine odor - POCT Urinalysis Dipstick - Urine Culture  2. Loose stools  3. Compression fracture of thoracic vertebra with routine healing, unspecified thoracic vertebral level, subsequent  encounter  Loose stools have resolved.  Pt has recent neck and vertebral fractures.  Cont f/u with Dr.Stern as scheduled.  Odor to urine- Send urine for culture.  Urine negative dipstick.  Has trace leukocytes, no nitrites or blood.  Will call patient with results.  Cont to hydrate with water, up to 32 oz per day.  Not to overload since pt has h/o CHF.  F/u if worsening frequency, pain with urination, hematuria, or abd pain, fever.  Daughter and pt in agreement with plan.  F/u prn.  Results for orders placed or performed in visit on 09/13/19  Urine Culture   Specimen: Urine   Urine  Result Value Ref Range   Urine Culture, Routine Final report    Organism ID, Bacteria Comment   POCT Urinalysis Dipstick  Result Value Ref Range   Color, UA     Clarity, UA     Glucose, UA     Bilirubin, UA     Ketones, UA     Spec Grav, UA 1.015 1.010 - 1.025   Blood,  UA trace    pH, UA 5.0 5.0 - 8.0   Protein, UA     Urobilinogen, UA     Nitrite, UA     Leukocytes, UA Trace (A) Negative   Appearance     Odor

## 2019-09-13 NOTE — Telephone Encounter (Signed)
[facility]) calling stating patient declined their services. [phone]

## 2019-09-15 LAB — URINE CULTURE

## 2019-09-16 ENCOUNTER — Encounter: Payer: Self-pay | Admitting: Family Medicine

## 2019-09-20 ENCOUNTER — Telehealth: Payer: Self-pay | Admitting: Family Medicine

## 2019-09-20 NOTE — Telephone Encounter (Signed)
Katherine Lane- with [facility]- took patients virtual after physical therapy today and blood pressure was 158/96 and she took it again it 142/88. [phone]

## 2019-09-20 NOTE — Telephone Encounter (Signed)
I would recommend checking blood pressure intermittently over the course of the next couple weeks patient has a follow-up with me on the 17th she needs to bring all of her medications with me on her visit

## 2019-09-21 DIAGNOSIS — I110 Hypertensive heart disease with heart failure: Secondary | ICD-10-CM

## 2019-09-21 DIAGNOSIS — J811 Chronic pulmonary edema: Secondary | ICD-10-CM

## 2019-09-21 DIAGNOSIS — J90 Pleural effusion, not elsewhere classified: Secondary | ICD-10-CM

## 2019-09-21 DIAGNOSIS — I5033 Acute on chronic diastolic (congestive) heart failure: Secondary | ICD-10-CM

## 2019-09-21 NOTE — Telephone Encounter (Signed)
Katherine Lane at [facility] that Dr Lorin Picket would recommend checking blood pressure intermittently over the course of the next couple weeks patient has a follow-up with him on the 17th -she needs to bring all of her medications with me on her visit. Belinda with home health verbalized understanding and will discuss this with patient and her family

## 2019-09-23 ENCOUNTER — Encounter: Payer: Self-pay | Admitting: Family Medicine

## 2019-09-26 ENCOUNTER — Ambulatory Visit: Payer: Medicare PPO | Admitting: Internal Medicine

## 2019-09-26 ENCOUNTER — Other Ambulatory Visit: Payer: Self-pay

## 2019-09-26 ENCOUNTER — Encounter: Payer: Self-pay | Admitting: Internal Medicine

## 2019-09-26 DIAGNOSIS — J90 Pleural effusion, not elsewhere classified: Secondary | ICD-10-CM

## 2019-09-26 NOTE — Progress Notes (Signed)
Katherine Lane, female    DOB: 1932-09-26, 86 y.o.   MRN: [id number]   Brief patient profile:  30 yowf never smoker  with no problem with breathing around 2019 attributed to atrial fib off enalapril since 03/2016 with worse sob p covid oct 2020 then clinical pna 08/16/19 then fx neck 08/25/19 p fell getting out of bed and very uncomfortable wearing neck/trunk brace with worse doe but still able to lie flat and referred to pulmonary clinic in [place]  09/26/2019 by Dr    Wolfgang Phoenix re abn cxr      History of Present Illness  09/26/2019  Pulmonary/ 1st office eval/ Aney[facility] D[facility] / [facility]  Chief Complaint  Patient presents with  . Consult    shortness of breath with exertion   Dyspnea: covid Oct 2020 some better then pneumonia 07/2019 presenting  cough, fever L side pleuritic cp and  room to room sob  / assoc with variable severe swelling in lower ext and new R > L pl effusions but no more pleuritic cp/ no cough   Sleep: on side/ bed  is horizontal one pillow  SABA use: none No 02   No obvious  day to day or daytime variability or assoc excess/ purulent sputum or mucus plugs or hemoptysis or ongoing cp or chest tightness, subjective wheeze or overt sinus or hb symptoms.   Sleeping  without nocturnal  or early am exacerbation  of respiratory  c/o's or need for noct saba. Also denies any obvious fluctuation of symptoms with weather or environmental changes or other aggravating or alleviating factors except as outlined above   No unusual exposure hx or h/o childhood pna/ asthma or knowledge of premature birth.  Current Allergies, Complete Past Medical History, Past Surgical History, Family History, and Social History were reviewed in [employer] record.  ROS  The following are not active complaints unless bolded Hoarseness, sore throat, dysphagia, dental problems, itching, sneezing,  nasal congestion or discharge of excess mucus or purulent secretions, ear ache,   fever,  chills, sweats, unintended wt loss or wt gain, classically pleuritic or exertional cp,  orthopnea pnd or arm/hand swelling  or leg swelling, presyncope, palpitations, abdominal pain, anorexia, nausea, vomiting, diarrhea  or change in bowel habits or change in bladder habits, change in stools or change in urine, dysuria, hematuria,  rash, arthralgias, visual complaints, headache, numbness, weakness or ataxia or problems with walking or coordination,  change in mood or  memory.               Past Medical History:  Diagnosis Date  . A-fib ([place])   . Anxiety   . Arthritis   . CHF (congestive heart failure) ([place])   . Chronic back pain   . Constipation   . Constipation, chronic   . Depression   . Family history of adverse reaction to anesthesia    Mother, Sister, daughter- N/V  . History of blood transfusion    "pregnancy"  . HTN (hypertension)   . Insomnia   . Migraine headache   . Osteoporosis   . PONV (postoperative nausea and vomiting)    "violent"  . Pulmonary edema 06/2016  . Sodium (Na) deficiency   . Trigeminal neuralgia     Outpatient Medications Prior to Visit  Medication Sig Dispense Refill  . cloNIDine (CATAPRES) 0.1 MG tablet Take 1 tablet (0.1 mg total) by mouth every evening. At 8:00 p.m. 30 tablet 6  . furosemide (LASIX) 20 MG  tablet Take 20 mg by mouth daily as needed (weight gain/fluid retention.).     Marland Kitchen hydrALAZINE (APRESOLINE) 50 MG tablet TAKE (1) TABLET BY MOUTH (4) TIMES DAILY. (Patient taking differently: Take 50 mg by mouth 4 (four) times daily. ) 120 tablet 6  . hydrocortisone (ANUSOL-HC) 2.5 % rectal cream APPLY RECTALLY THREE TIMES DAILY AS DIRECTED (Patient taking differently: Place 1 application rectally 2 (two) times daily as needed for hemorrhoids or anal itching. ) 30 g 5  . Lidocaine-Hydrocortisone Ace 3-2.5 % KIT APPLY TO RECTUM QID AS NEEDED FOR RECTAL PAIN OR BLEEDING (Patient taking differently: Place 1 application rectally daily as needed (rectal  pain or bleeding). ) 1 kit 1  . LORazepam (ATIVAN) 0.5 MG tablet TAKE ONE TABLET BY MOUTH UP TO TWICE DAILY AS NEEDED. (Patient taking differently: Take 0.5 mg by mouth 2 (two) times daily as needed for anxiety or sleep. TAKE ONE TABLET BY MOUTH UP TO TWICE DAILY AS NEEDED.) 60 tablet 5  . losartan (COZAAR) 50 MG tablet Take 1 tablet (50 mg total) by mouth 2 (two) times daily. Daily at 7 AM and at 7 PM 60 tablet 6  . Magnesium 250 MG TABS Take 250 mg by mouth at bedtime.     . metoprolol succinate (TOPROL-XL) 100 MG 24 hr tablet Take 1 tablet (100 mg total) by mouth daily. 30 tablet 11  . Multiple Vitamin (MULTIVITAMIN WITH MINERALS) TABS tablet Take 1 tablet by mouth daily. Centrum Silver    . Polyethyl Glycol-Propyl Glycol (LUBRICANT EYE DROPS) 0.4-0.3 % SOLN Place 1 drop into both eyes at bedtime.    . polyethylene glycol powder (MIRALAX) powder Take 17 g daily as needed by mouth for moderate constipation.     . potassium chloride (KLOR-CON) 10 MEQ tablet Take 1 tablet (10 mEq total) by mouth every other day. 45 tablet 3  . prochlorperazine (COMPAZINE) 5 MG tablet Take 2 tablets (10 mg total) by mouth every 8 (eight) hours as needed for nausea or vomiting. 20 tablet 0  . shark liver oil-cocoa butter (PREPARATION H) 0.25-3-85.5 % suppository Place 1 suppository rectally 2 (two) times daily as needed for hemorrhoids.     Alveda Reasons 20 MG TABS tablet TAKE 1 TABLET DAILY WITH SUPPER. (Patient taking differently: Take 20 mg by mouth daily with supper. ) 90 tablet 1  . zolpidem (AMBIEN) 5 MG tablet TAKE 1 TABLET BY MOUTH AT BEDTIME FOR SLEEP. 30 tablet 2  . HYDROcodone-acetaminophen (NORCO) 5-325 MG tablet Take 1 tablet by mouth every 6 (six) hours as needed for moderate pain. (Patient not taking: Reported on 09/26/2019) 20 tablet 0  . Multiple Vitamins-Minerals (PRESERVISION AREDS 2 PO) Take 1 tablet by mouth 2 (two) times daily. (Patient not taking: Reported on 09/26/2019)    . sertraline (ZOLOFT) 25 MG  tablet Take 1 tablet (25 mg total) by mouth daily. (Patient not taking: Reported on 09/26/2019) 30 tablet 2  . ciprofloxacin-dexamethasone (CIPRODEX) OTIC suspension Place 4 drops into affected ear bid (Patient not taking: Reported on 09/26/2019) 7.5 mL 0  . clobetasol (TEMOVATE) 0.05 % external solution APPLY TO AFFECTED AREAS TWICE DAILY. (Patient not taking: Reported on 09/26/2019) 50 mL 3  . metoprolol tartrate (LOPRESSOR) 25 MG tablet Take 1 tablet (25 mg total) by mouth daily as needed (palpitations). (Patient not taking: Reported on 09/26/2019) 60 tablet 1  . sulfamethoxazole-trimethoprim (BACTRIM DS) 800-160 MG tablet Take 1 tablet by mouth 2 (two) times daily. (Patient not taking: Reported on 09/26/2019)  14 tablet 0   No facility-administered medications prior to visit.     Objective:     BP 126/80 (BP Location: Left Arm, Cuff Size: Normal)   Pulse (!) 101   Temp 97.6 F (36.4 C) (Oral)   Ht _0  (1.575 m)   Wt 124 lb (56.2 kg)   SpO2 95% Comment: Room air  BMI 22.68 kg/m   SpO2: 95 % (Room air)  Frail elderly wf sitting in w/c miserable  frustrated with brace - "I just want to go home"   HEENT : pt wearing mask not removed for exam due to covid -19 concerns.    NECK :  without JVD/Nodes/TM/ nl carotid upstrokes bilaterally   LUNGS: no acc muscle use,  Chest difficult exam due to brace. Appears to have some decrease bs R>L s crackles/ some dullness   CV:  RRR  no s3 or murmur or increase in P2, and no edema at present    ABD:  soft and nontender with limited inspiratory excursion  n. No bruits or organomegaly appreciated, bowel sounds nl  MS:    ext warm without deformities, calf tenderness, cyanosis or clubbing No obvious joint restrictions   SKIN: warm and dry without lesions    NEURO:  alert, approp, nl sensorium with  no motor or cerebellar deficits apparent.     I personally reviewed images and agree with radiology impression as follows:  CXR:   Portable 08/25/19   Cardiomegaly with unchanged small, layering pleural effusions. No acute appearing airspace opacity.     Assessment   Bilateral pleural effusion Echo 11/01/17  Left ventricle: The cavity size was normal. Wall thickness was  increased in a pattern of mild LVH. Systolic function was  vigorous. The estimated ejection fraction was in the range of 65%  to 70%. Wall motion was normal; there were no regional wall  motion abnormalities. The study was not technically sufficient to  allow evaluation of LV diastolic dysfunction due to atrial  fibrillation. Doppler parameters are consistent with high  ventricular filling pressure.  - Aortic valve: Trileaflet; mildly thickened leaflets. There was  mild regurgitation.  - Aorta: Mild ascending aortic dilatation. Diameter 4.03 cm.  - Mitral valve: There was mild regurgitation.  - Atrial septum: No defect or patent foramen ovale was identified.  - Tricuspid valve: There was mild regurgitation.  - Pulmonary arteries: Systolic pressure was mildly to moderately  increased. PA peak pressure: 41 mm Hg (S).  Although ddx for bilateral effusions is quite long, the pattern of R > L in pt with intermittent new leg swelling with background of likely diastolic dysfunction with MR /TR is a perfect set up for hydrostatic effusions with lower than nl albumin levels adding to the problem   Rec: Repeat pa and lat cxr when feasible Consider adding aldactone if peripheral edema not well controlled with lasix as 3rd spaced fluid may be more difficult to remove vs LE edema which pt's daughter says has been very problematic in an of itself  See cards as planned with repeat echo when feasible given the problems with accessing windows in pt with full truncal brace.           Each maintenance medication was reviewed in detail including emphasizing most importantly the difference between maintenance and prns and under what circumstances the prns are to  be triggered using an action plan format where appropriate.  Total time for H and P, chart review, counseling,   and  generating customized AVS unique to this office visit / charting = 67 min          Christinia Gully, MD 09/26/2019

## 2019-09-26 NOTE — Assessment & Plan Note (Signed)
Echo 11/01/17  Left ventricle: The cavity size was normal. Wall thickness was  increased in a pattern of mild LVH. Systolic function was  vigorous. The estimated ejection fraction was in the range of 65%  to 70%. Wall motion was normal; there were no regional wall  motion abnormalities. The study was not technically sufficient to  allow evaluation of LV diastolic dysfunction due to atrial  fibrillation. Doppler parameters are consistent with high  ventricular filling pressure.  - Aortic valve: Trileaflet; mildly thickened leaflets. There was  mild regurgitation.  - Aorta: Mild ascending aortic dilatation. Diameter 4.03 cm.  - Mitral valve: There was mild regurgitation.  - Atrial septum: No defect or patent foramen ovale was identified.  - Tricuspid valve: There was mild regurgitation.  - Pulmonary arteries: Systolic pressure was mildly to moderately  increased. PA peak pressure: 41 mm Hg (S).  Although ddx for bilateral effusions is quite long, the pattern of R > L in pt with intermittent new leg swelling with background of likely diastolic dysfunction with MR /TR is a perfect set up for hydrostatic effusions with lower than nl albumin levels adding to the problem   Rec: Repeat pa and lat cxr when feasible Consider adding aldactone if peripheral edema not well controlled with lasix as 3rd spaced fluid may be more difficult to remove vs LE edema which pt's daughter says has been very problematic in an of itself  See cards as planned with repeat echo when feasible given the problems with accessing windows in pt with full truncal brace.           Each maintenance medication was reviewed in detail including emphasizing most importantly the difference between maintenance and prns and under what circumstances the prns are to be triggered using an action plan format where appropriate.  Total time for H and P, chart review, counseling,   and generating customized AVS unique to  this office visit / charting = 67 min

## 2019-09-26 NOTE — Patient Instructions (Addendum)
We need to get a good cxr on you - you can return to [facility] convenience.  Continue lasix as per the previous recommendations    Please schedule a follow up office visit in 4 weeks, sooner if needed

## 2019-09-27 ENCOUNTER — Ambulatory Visit (INDEPENDENT_AMBULATORY_CARE_PROVIDER_SITE_OTHER): Payer: Medicare PPO | Admitting: Family Medicine

## 2019-09-27 ENCOUNTER — Inpatient Hospital Stay (HOSPITAL_COMMUNITY)
Admission: EM | Admit: 2019-09-27 | Discharge: 2019-09-29 | DRG: 291 | Disposition: A | Discharge status: Home Health | Payer: Medicare PPO | Source: Ambulatory Visit | Attending: Internal Medicine | Admitting: Internal Medicine

## 2019-09-27 ENCOUNTER — Other Ambulatory Visit (HOSPITAL_COMMUNITY)
Admission: RE | Admit: 2019-09-27 | Discharge: 2019-09-27 | Disposition: A | Discharge status: Home / Self Care | Payer: Medicare PPO | Source: Ambulatory Visit | Attending: Family Medicine | Admitting: Family Medicine

## 2019-09-27 ENCOUNTER — Encounter (HOSPITAL_COMMUNITY): Payer: Self-pay | Admitting: Emergency Medicine

## 2019-09-27 ENCOUNTER — Other Ambulatory Visit: Payer: Self-pay

## 2019-09-27 ENCOUNTER — Ambulatory Visit: Payer: Medicare PPO | Admitting: Family Medicine

## 2019-09-27 ENCOUNTER — Telehealth: Payer: Self-pay | Admitting: Family Medicine

## 2019-09-27 ENCOUNTER — Ambulatory Visit (HOSPITAL_COMMUNITY)
Admission: RE | Admit: 2019-09-27 | Discharge: 2019-09-27 | Disposition: A | Discharge status: Home / Self Care | Payer: Medicare PPO | Source: Ambulatory Visit | Attending: Internal Medicine | Admitting: Internal Medicine

## 2019-09-27 DIAGNOSIS — R600 Localized edema: Secondary | ICD-10-CM | POA: Insufficient documentation

## 2019-09-27 DIAGNOSIS — J90 Pleural effusion, not elsewhere classified: Secondary | ICD-10-CM

## 2019-09-27 DIAGNOSIS — Y92003 Bedroom of unspecified non-institutional (private) residence as the place of occurrence of the external cause: Secondary | ICD-10-CM | POA: Diagnosis not present

## 2019-09-27 DIAGNOSIS — I10 Essential (primary) hypertension: Secondary | ICD-10-CM | POA: Diagnosis present

## 2019-09-27 DIAGNOSIS — W06XXXA Fall from bed, initial encounter: Secondary | ICD-10-CM | POA: Diagnosis present

## 2019-09-27 DIAGNOSIS — Z8249 Family history of ischemic heart disease and other diseases of the circulatory system: Secondary | ICD-10-CM

## 2019-09-27 DIAGNOSIS — D649 Anemia, unspecified: Secondary | ICD-10-CM | POA: Diagnosis present

## 2019-09-27 DIAGNOSIS — I509 Heart failure, unspecified: Secondary | ICD-10-CM | POA: Diagnosis not present

## 2019-09-27 DIAGNOSIS — Z833 Family history of diabetes mellitus: Secondary | ICD-10-CM

## 2019-09-27 DIAGNOSIS — Z8719 Personal history of other diseases of the digestive system: Secondary | ICD-10-CM

## 2019-09-27 DIAGNOSIS — J9811 Atelectasis: Secondary | ICD-10-CM | POA: Diagnosis present

## 2019-09-27 DIAGNOSIS — R1013 Epigastric pain: Secondary | ICD-10-CM | POA: Diagnosis not present

## 2019-09-27 DIAGNOSIS — I110 Hypertensive heart disease with heart failure: Principal | ICD-10-CM | POA: Diagnosis present

## 2019-09-27 DIAGNOSIS — Z20822 Contact with and (suspected) exposure to covid-19: Secondary | ICD-10-CM | POA: Diagnosis present

## 2019-09-27 DIAGNOSIS — R29898 Other symptoms and signs involving the musculoskeletal system: Secondary | ICD-10-CM

## 2019-09-27 DIAGNOSIS — Z885 Allergy status to narcotic agent status: Secondary | ICD-10-CM | POA: Diagnosis not present

## 2019-09-27 DIAGNOSIS — I5033 Acute on chronic diastolic (congestive) heart failure: Secondary | ICD-10-CM | POA: Diagnosis not present

## 2019-09-27 DIAGNOSIS — F418 Other specified anxiety disorders: Secondary | ICD-10-CM | POA: Diagnosis not present

## 2019-09-27 DIAGNOSIS — R197 Diarrhea, unspecified: Secondary | ICD-10-CM | POA: Diagnosis present

## 2019-09-27 DIAGNOSIS — I480 Paroxysmal atrial fibrillation: Secondary | ICD-10-CM | POA: Diagnosis present

## 2019-09-27 DIAGNOSIS — E876 Hypokalemia: Secondary | ICD-10-CM | POA: Diagnosis present

## 2019-09-27 DIAGNOSIS — J9601 Acute respiratory failure with hypoxia: Secondary | ICD-10-CM | POA: Diagnosis present

## 2019-09-27 DIAGNOSIS — R0600 Dyspnea, unspecified: Secondary | ICD-10-CM | POA: Insufficient documentation

## 2019-09-27 DIAGNOSIS — I7774 Dissection of vertebral artery: Secondary | ICD-10-CM | POA: Diagnosis present

## 2019-09-27 DIAGNOSIS — R0609 Other forms of dyspnea: Secondary | ICD-10-CM

## 2019-09-27 DIAGNOSIS — G4700 Insomnia, unspecified: Secondary | ICD-10-CM | POA: Diagnosis present

## 2019-09-27 DIAGNOSIS — I4892 Unspecified atrial flutter: Secondary | ICD-10-CM | POA: Diagnosis present

## 2019-09-27 DIAGNOSIS — R531 Weakness: Secondary | ICD-10-CM | POA: Diagnosis present

## 2019-09-27 DIAGNOSIS — F419 Anxiety disorder, unspecified: Secondary | ICD-10-CM | POA: Diagnosis present

## 2019-09-27 DIAGNOSIS — Z7901 Long term (current) use of anticoagulants: Secondary | ICD-10-CM

## 2019-09-27 DIAGNOSIS — S12000A Unspecified displaced fracture of first cervical vertebra, initial encounter for closed fracture: Secondary | ICD-10-CM | POA: Diagnosis present

## 2019-09-27 DIAGNOSIS — I5031 Acute diastolic (congestive) heart failure: Secondary | ICD-10-CM | POA: Diagnosis not present

## 2019-09-27 DIAGNOSIS — Z820 Family history of epilepsy and other diseases of the nervous system: Secondary | ICD-10-CM | POA: Diagnosis not present

## 2019-09-27 LAB — COMPREHENSIVE METABOLIC PANEL
ALT: 15 U/L (ref 0–44)
AST: 22 U/L (ref 15–41)
Albumin: 2.9 g/dL — ABNORMAL LOW (ref 3.5–5.0)
Alkaline Phosphatase: 83 U/L (ref 38–126)
Anion gap: 8 (ref 5–15)
BUN: 15 mg/dL (ref 8–23)
CO2: 24 mmol/L (ref 22–32)
Calcium: 8.3 mg/dL — ABNORMAL LOW (ref 8.9–10.3)
Chloride: 99 mmol/L (ref 98–111)
Creatinine, Ser: 0.7 mg/dL (ref 0.44–1.00)
GFR calc Af Amer: >60 mL/min (ref >60)
GFR calc non Af Amer: >60 mL/min (ref >60)
Glucose, Bld: 105 mg/dL — ABNORMAL HIGH (ref 70–99)
Potassium: 2.9 mmol/L — ABNORMAL LOW (ref 3.5–5.1)
Sodium: 131 mmol/L — ABNORMAL LOW (ref 135–145)
Total Bilirubin: 1.3 mg/dL — ABNORMAL HIGH (ref 0.3–1.2)
Total Protein: 6.2 g/dL — ABNORMAL LOW (ref 6.5–8.1)

## 2019-09-27 LAB — POC OCCULT BLOOD, ED: Fecal Occult Bld: NEGATIVE

## 2019-09-27 LAB — BRAIN NATRIURETIC PEPTIDE: B Natriuretic Peptide: 1418 pg/mL — ABNORMAL HIGH (ref 0.0–100.0)

## 2019-09-27 LAB — CBC WITH DIFFERENTIAL/PLATELET
Abs Immature Granulocytes: 0.05 10*3/uL (ref 0.00–0.07)
Basophils Absolute: 0 10*3/uL (ref 0.0–0.1)
Basophils Relative: 0 %
Eosinophils Absolute: 0 10*3/uL (ref 0.0–0.5)
Eosinophils Relative: 1 %
HCT: 32.9 % — ABNORMAL LOW (ref 36.0–46.0)
Hemoglobin: 10.3 g/dL — ABNORMAL LOW (ref 12.0–15.0)
Immature Granulocytes: 1 %
Lymphocytes Relative: 18 %
Lymphs Abs: 1.1 10*3/uL (ref 0.7–4.0)
MCH: 28 pg (ref 26.0–34.0)
MCHC: 31.3 g/dL (ref 30.0–36.0)
MCV: 89.4 fL (ref 80.0–100.0)
Monocytes Absolute: 0.7 10*3/uL (ref 0.1–1.0)
Monocytes Relative: 12 %
Neutro Abs: 4.2 10*3/uL (ref 1.7–7.7)
Neutrophils Relative %: 68 %
Platelets: 396 10*3/uL (ref 150–400)
RBC: 3.68 MIL/uL — ABNORMAL LOW (ref 3.87–5.11)
RDW: 16.1 % — ABNORMAL HIGH (ref 11.5–15.5)
WBC: 6.1 10*3/uL (ref 4.0–10.5)
nRBC: 0 % (ref 0.0–0.2)

## 2019-09-27 LAB — PROCALCITONIN: Procalcitonin: <0.1 ng/mL

## 2019-09-27 LAB — SARS CORONAVIRUS 2 BY RT PCR (HOSPITAL ORDER, PERFORMED IN ~~LOC~~ HOSPITAL LAB): SARS Coronavirus 2: NEGATIVE

## 2019-09-27 LAB — TROPONIN I (HIGH SENSITIVITY)
Troponin I (High Sensitivity): 12 ng/L (ref <18)
Troponin I (High Sensitivity): 14 ng/L (ref <18)

## 2019-09-27 LAB — MAGNESIUM: Magnesium: 2 mg/dL (ref 1.7–2.4)

## 2019-09-27 LAB — LIPASE, BLOOD: Lipase: 28 U/L (ref 11–51)

## 2019-09-27 MED ORDER — ACETAMINOPHEN 325 MG PO TABS: 650.0000 mg | ORAL_TABLET | ORAL | Status: DC | PRN

## 2019-09-27 MED ORDER — POTASSIUM CHLORIDE 10 MEQ/100ML IV SOLN
10.0000 meq | INTRAVENOUS | Status: AC
  Administered 2019-09-27 (×2): 10 meq via INTRAVENOUS
  Filled 2019-09-27 (×2): qty 100

## 2019-09-27 MED ORDER — FUROSEMIDE 10 MG/ML IJ SOLN
40.0000 mg | Freq: Once | INTRAMUSCULAR | Status: AC
  Administered 2019-09-27: 40 mg via INTRAVENOUS
  Filled 2019-09-27: qty 4

## 2019-09-27 MED ORDER — METOPROLOL SUCCINATE ER 50 MG PO TB24
100.0000 mg | ORAL_TABLET | Freq: Every day | ORAL | Status: DC
  Administered 2019-09-28 – 2019-09-29 (×2): 100 mg via ORAL
  Filled 2019-09-27: qty 2
  Filled 2019-09-27: qty 4

## 2019-09-27 MED ORDER — PANTOPRAZOLE SODIUM 40 MG PO TBEC
40.0000 mg | DELAYED_RELEASE_TABLET | Freq: Every day | ORAL | 2 refills | Status: DC
Start: 2019-09-27 — End: 2019-10-03

## 2019-09-27 MED ORDER — FUROSEMIDE 10 MG/ML IJ SOLN
40.0000 mg | Freq: Two times a day (BID) | INTRAMUSCULAR | Status: DC
  Administered 2019-09-28 (×2): 40 mg via INTRAVENOUS
  Filled 2019-09-27 (×2): qty 4

## 2019-09-27 MED ORDER — HYDRALAZINE HCL 25 MG PO TABS
50.0000 mg | ORAL_TABLET | Freq: Three times a day (TID) | ORAL | Status: DC
  Administered 2019-09-27 – 2019-09-29 (×5): 50 mg via ORAL
  Filled 2019-09-27 (×6): qty 2

## 2019-09-27 MED ORDER — ZOLPIDEM TARTRATE 5 MG PO TABS
5.0000 mg | ORAL_TABLET | Freq: Every evening | ORAL | Status: DC | PRN
  Administered 2019-09-27 – 2019-09-28 (×2): 5 mg via ORAL
  Filled 2019-09-27 (×2): qty 1

## 2019-09-27 MED ORDER — CLONIDINE HCL 0.1 MG PO TABS
0.1000 mg | ORAL_TABLET | Freq: Every evening | ORAL | Status: DC
  Administered 2019-09-28: 0.1 mg via ORAL
  Filled 2019-09-27: qty 1

## 2019-09-27 MED ORDER — LOSARTAN POTASSIUM 50 MG PO TABS
50.0000 mg | ORAL_TABLET | Freq: Two times a day (BID) | ORAL | Status: DC
  Administered 2019-09-27 – 2019-09-29 (×3): 50 mg via ORAL
  Filled 2019-09-27: qty 2
  Filled 2019-09-27 (×2): qty 1
  Filled 2019-09-27: qty 2

## 2019-09-27 MED ORDER — POTASSIUM CHLORIDE CRYS ER 20 MEQ PO TBCR
40.0000 meq | EXTENDED_RELEASE_TABLET | Freq: Once | ORAL | Status: AC
  Administered 2019-09-27: 40 meq via ORAL
  Filled 2019-09-27: qty 2

## 2019-09-27 MED ORDER — LORAZEPAM 0.5 MG PO TABS
0.5000 mg | ORAL_TABLET | Freq: Two times a day (BID) | ORAL | Status: DC | PRN
  Administered 2019-09-28 – 2019-09-29 (×2): 0.5 mg via ORAL
  Filled 2019-09-27 (×3): qty 1

## 2019-09-27 MED ORDER — PANTOPRAZOLE SODIUM 40 MG IV SOLR
40.0000 mg | INTRAVENOUS | Status: DC
  Administered 2019-09-27: 40 mg via INTRAVENOUS
  Filled 2019-09-27 (×2): qty 40

## 2019-09-27 MED ORDER — SODIUM CHLORIDE 0.9% FLUSH
3.0000 mL | Freq: Two times a day (BID) | INTRAVENOUS | Status: DC
  Administered 2019-09-28 – 2019-09-29 (×3): 3 mL via INTRAVENOUS

## 2019-09-27 NOTE — Telephone Encounter (Signed)
[facility]- needing verbal orders for skilled nursing for one week  and one week every other week for 4 weeks. [phone] if no answer please leave message

## 2019-09-27 NOTE — H&P (Signed)
History and Physical    Katherine Lane KZL:[id number] DOB: 1932/12/30 DOA: 09/27/2019  PCP: Kathyrn Drown, MD   Patient coming from: Home  I have personally briefly reviewed patient's old medical records in [place]  Chief Complaint: Difficulty breathing  HPI: Katherine Lane is a 86 y.o. female with medical history significant for acute on chronic diastolic CHF, atrial flutter, hypertension, depression. Patient was sent from primary care provider's office with complaints of difficulty breathing on exertion, leg swelling over the past 2-1/2 weeks.  Patient has chronic breathing problems.  Patient also reports upper abdominal pain.  She denies black stools, no vomiting of blood.  No blood in stools.  No NSAID use.  Daughter reports patient's leg usually never swell, when she has excess fluid.  Patient checks her weight daily, and weight has remained stable at about 123 pounds.  Patient fell from her bed 1 month ago, for which was seen in the ED 7/8 was placed on a neck and back brace, recommended by neurosurgery.  She was not a candidate for surgery.   CT spine showed acute comminuted nondisplaced fracture of the left lateral mass of C1.  Subsequent CTA neck showed grade 1 blunt cerebrovascular injury of the left vertebral artery at the C1 transverse foramen.  Patient reports neck and back brace sometimes limits her breathing, but she is able to lie almost flat.  No chest pain.  No cough.  She takes 70m Lasix as needed.  She reports compliance with her Xarelto. Patient reports bilateral lower extremities have felt weak, she walks with a walker or a cane.  She has no back pain or neck pain, despite fracture and collar.  ED Course: Temperature 98.4, blood pressure 130s to 160s, heart rate 90s to low 100s, O2 sats greater than 97% on room air. Most of the blood work was done by primary care provider prior to arrival, BNP elevated at 1418 compared to prior - 300s.  Potassium 2.9.   Hemoglobin 10.3.  Troponin 12.  Small left pleural effusion with left base atelectasis or infiltrate. 40 mg IV Lasix given, IV KCL 169m x 2.  Hospitalist to admit for decompensated CHF and possible gi bleed.   Review of Systems: As per HPI all other systems reviewed and negative.  Past Medical History:  Diagnosis Date  . [place]  . Anxiety   . Arthritis   . CH[place] ([place]ive heart failure) ([place]  . Chronic back pain   . Constipation   . Constipation, chronic   . Depression   . Family history of adverse reaction to anesthesia    Mother, Sister, daughter- N/V  . History of blood transfusion    "pregnancy"  . HTN (hypertension)   . Insomnia   . Migraine headache   . Osteoporosis   . PONV (postoperative nausea and vomiting)    "violent"  . Pulmonary edema 06/2016  . Sodium (Na) deficiency   . Trigeminal neuralgia     Past Surgical History:  Procedure Laterality Date  . ANKLE FRACTURE SURGERY Right   . CARDIAC CATHETERIZATION     denies  . CATARACT EXTRACTION W/PHACO Right 07/22/2012   Procedure: CATARACT EXTRACTION PHACO AND INTRAOCULAR LENS PLACEMENT (IOC);  Surgeon: KeTonny BranchMD;  Location: [facility];  Service: Ophthalmology;  Laterality: Right;  CDE:  18.93  . CATARACT EXTRACTION W/PHACO Left 08/09/2012   Procedure: CATARACT EXTRACTION PHACO AND INTRAOCULAR LENS PLACEMENT (IOC);  Surgeon: KeTonny BranchMD;  Location:  [facility];  Service: Ophthalmology;  Laterality: Left;  CDE: 17.21  . COLONOSCOPY    . FLEXIBLE SIGMOIDOSCOPY N/A 11/19/2018   Procedure: FLEXIBLE SIGMOIDOSCOPY;  Surgeon: Danie Binder, MD;  Location: [facility];  Service: Endoscopy;  Laterality: N/A;  7:30am  . HARDWARE REMOVAL  07/18/2011   Procedure: HARDWARE REMOVAL; Right leg-  Surgeon: Carole Civil, MD;  Location: [facility];  Service: Orthopedics;  Laterality: Right;  . HEMORRHOID BANDING N/A 11/19/2018   Procedure: HEMORRHOID BANDING;  Surgeon: Danie Binder, MD;  [facility];  Service:  Endoscopy;  Laterality: N/A;  . KYPHOPLASTY N/A 12/26/2016   Procedure: Lumbar two Lumbar three and Lumbar five KYPHOPLASTY;  Surgeon: Erline Levine, MD;  Location: [facility];  Service: Neurosurgery;  Laterality: N/A;  . KYPHOPLASTY N/A 02/19/2017   Procedure: KYPHOPLASTY LUMBAR ONE;  Surgeon: Erline Levine, MD;  Location: [facility];  Service: Neurosurgery;  Laterality: N/A;  KYPHOPLASTY LUMBAR ONE  . NERVE REPAIR  07/18/2011   Procedure: NERVE REPAIR;  Surgeon: Carole Civil, MD;  Location: [facility];  Service: Orthopedics;  Laterality: Right;  Right leg superficial peroneal nerve release    . PARTIAL HYSTERECTOMY       reports that she has never smoked. She has never used smokeless tobacco. She reports that she does not drink alcohol and does not use drugs.  Allergies  Allergen Reactions  . Amlodipine Swelling    Leg swelling   . Codeine Nausea And Vomiting and Other (See Comments)    Patient states "intolerance to all pain medications"  (NO OPIOIDS) VERY VIOLENT VOMITING!!  . Other Nausea And Vomiting and Other (See Comments)    general anesthesia  . Tramadol Nausea And Vomiting and Other (See Comments)    "NO OPIOIDS!!! VERY VIOLENT VOMITING!!" per Med History prior to 02/18/17    Family History  Problem Relation Age of Onset  . Hypertension Mother   . Alzheimer's disease Mother   . Cancer Mother   . Hypertension Father   . Other Father        abused pain meds  . Diabetes Brother   . Other Brother        back problems  . Diabetes Daughter   . Anesthesia problems Neg Hx   . Hypotension Neg Hx   . Malignant hyperthermia Neg Hx   . Pseudochol deficiency Neg Hx     Prior to Admission medications   Medication Sig Start Date End Date Taking? Authorizing Provider  cloNIDine (CATAPRES) 0.1 MG tablet Take 1 tablet (0.1 mg total) by mouth every evening. At 8:00 p.m. 03/28/19   Herminio Commons, MD  furosemide (LASIX) 20 MG tablet Take 20 mg by mouth daily as needed (weight gain/fluid  retention.).     [provider]  hydrALAZINE (APRESOLINE) 50 MG tablet TAKE (1) TABLET BY MOUTH (4) TIMES DAILY. Patient taking differently: Take 50 mg by mouth 4 (four) times daily.  05/19/19   Herminio Commons, MD  HYDROcodone-acetaminophen (NORCO) 5-325 MG tablet Take 1 tablet by mouth every 6 (six) hours as needed for moderate pain. Patient not taking: Reported on 09/26/2019 08/25/19   Daleen Bo, MD  hydrocortisone (ANUSOL-HC) 2.5 % rectal cream APPLY RECTALLY THREE TIMES DAILY AS DIRECTED Patient taking differently: Place 1 application rectally 2 (two) times daily as needed for hemorrhoids or anal itching.  10/19/18   Mikey Kirschner, MD  Lidocaine-Hydrocortisone Ace 3-2.5 % KIT APPLY TO RECTUM QID AS NEEDED FOR  RECTAL PAIN OR BLEEDING Patient taking differently: Place 1 application rectally daily as needed (rectal pain or bleeding).  05/12/19   Fields, Marga Melnick, MD  LORazepam (ATIVAN) 0.5 MG tablet TAKE ONE TABLET BY MOUTH UP TO TWICE DAILY AS NEEDED. Patient taking differently: Take 0.5 mg by mouth 2 (two) times daily as needed for anxiety or sleep. TAKE ONE TABLET BY MOUTH UP TO TWICE DAILY AS NEEDED. 07/11/19   Mikey Kirschner, MD  losartan (COZAAR) 50 MG tablet Take 1 tablet (50 mg total) by mouth 2 (two) times daily. Daily at 7 AM and at 7 PM 05/11/19   Herminio Commons, MD  Magnesium 250 MG TABS Take 250 mg by mouth at bedtime.     [provider]  metoprolol succinate (TOPROL-XL) 100 MG 24 hr tablet Take 1 tablet (100 mg total) by mouth daily. 02/25/19   Herminio Commons, MD  Multiple Vitamin (MULTIVITAMIN WITH MINERALS) TABS tablet Take 1 tablet by mouth daily. Centrum Silver    [provider]  Multiple Vitamins-Minerals (PRESERVISION AREDS 2 PO) Take 1 tablet by mouth 2 (two) times daily. Patient not taking: Reported on 09/26/2019    [provider]  pantoprazole (PROTONIX) 40 MG tablet Take 1 tablet (40 mg total) by mouth daily. 09/27/19    Kathyrn Drown, MD  Polyethyl Glycol-Propyl Glycol (LUBRICANT EYE DROPS) 0.4-0.3 % SOLN Place 1 drop into both eyes at bedtime.    [provider]  polyethylene glycol powder (MIRALAX) powder Take 17 g daily as needed by mouth for moderate constipation.     [provider]  potassium chloride (KLOR-CON) 10 MEQ tablet Take 1 tablet (10 mEq total) by mouth every other day. 07/29/19   Herminio Commons, MD  prochlorperazine (COMPAZINE) 5 MG tablet Take 2 tablets (10 mg total) by mouth every 8 (eight) hours as needed for nausea or vomiting. 08/25/19   Daleen Bo, MD  sertraline (ZOLOFT) 25 MG tablet Take 1 tablet (25 mg total) by mouth daily. Patient not taking: Reported on 09/26/2019 08/29/19   Kathyrn Drown, MD  shark liver oil-cocoa butter (PREPARATION H) 0.25-3-85.5 % suppository Place 1 suppository rectally 2 (two) times daily as needed for hemorrhoids.     [provider]  XARELTO 20 MG TABS tablet TAKE 1 TABLET DAILY WITH SUPPER. Patient taking differently: Take 20 mg by mouth daily with supper.  03/22/19   Herminio Commons, MD  zolpidem (AMBIEN) 5 MG tablet TAKE 1 TABLET BY MOUTH AT BEDTIME FOR SLEEP. 08/29/19   Kathyrn Drown, MD    Physical Exam: Vitals:   09/27/19 1759 09/27/19 1800 09/27/19 2000  BP: (!) 139/94  (!) 163/107  Pulse: (!) 110  99  Resp: 18  (!) 35  Temp: 98.4 F (36.9 C)    TempSrc: Oral    SpO2: 97%  97%  Weight:  56.2 kg   Height:  '5\' 2"'  (1.575 m)     Constitutional: NAD, calm, due to back brace, patient appears uncomfortable Vitals:   09/27/19 1759 09/27/19 1800 09/27/19 2000  BP: (!) 139/94  (!) 163/107  Pulse: (!) 110  99  Resp: 18  (!) 35  Temp: 98.4 F (36.9 C)    TempSrc: Oral    SpO2: 97%  97%  Weight:  56.2 kg   Height:  '5\' 2"'  (1.575 m)    Eyes: PERRL, lids and conjunctivae normal ENMT: Has a rigid cervical collar and upper back brace, mucous membranes  are moist. Neck: normal, supple, no masses, no  thyromegaly Respiratory: clear to auscultation bilaterally, no wheezing, no crackles. Normal respiratory effort. No accessory muscle use.  Cardiovascular: Irregular rate and rhythm, no murmurs / rubs / gallops.  1+ pitting extremity edema to upper legs, 2+ pedal pulses.  Abdomen: no tenderness, no masses palpated. No hepatosplenomegaly. Bowel sounds positive.  Musculoskeletal: no clubbing / cyanosis. No joint deformity upper and lower extremities. Good ROM, no contractures. Normal muscle tone.  Skin: no rashes, lesions, ulcers. No induration Neurologic: No apparent cranial nerve abnormality, moving extremities continuously, 5/5 strength in upper extremity, 4+5 strength in bilateral lower extremities. Psychiatric: Normal judgment and insight. Alert and oriented x 3. Normal mood.   Labs on Admission: I have personally reviewed following labs and imaging studies  CBC: Recent Labs  Lab 09/27/19 1535  WBC 6.1  NEUTROABS 4.2  HGB 10.3*  HCT 32.9*  MCV 89.4  PLT 782   Basic Metabolic Panel: Recent Labs  Lab 09/27/19 1535 09/27/19 1950  NA 131*  --   K 2.9*  --   CL 99  --   CO2 24  --   GLUCOSE 105*  --   BUN 15  --   CREATININE 0.70  --   CALCIUM 8.3*  --   MG  --  2.0   GFR: Estimated Creatinine Clearance: 41.4 mL/min (by C-G formula based on SCr of 0.7 mg/dL). Liver Function Tests: Recent Labs  Lab 09/27/19 1535  AST 22  ALT 15  ALKPHOS 83  BILITOT 1.3*  PROT 6.2*  ALBUMIN 2.9*   Recent Labs  Lab 09/27/19 1535  LIPASE 28    Radiological Exams on Admission: DG Chest 2 View  Result Date: 09/27/2019 CLINICAL DATA:  86 year old female with shortness of breath EXAM: CHEST - 2 VIEW COMPARISON:  Chest radiograph dated 08/25/2019. FINDINGS: Small left pleural effusion with left lung base atelectasis or infiltrate. Calcified nodule in the right mid lung field as seen previously. There is no pneumothorax. There is mild cardiomegaly. Atherosclerotic calcification of the  aorta. Degenerative changes of the spine. No acute osseous pathology. Upper lumbar compression fracture and vertebroplasty. IMPRESSION: Small left pleural effusion with left lung base atelectasis or infiltrate. Electronically Signed   By: Anner Crete M.D.   On: 09/27/2019 19:47    EKG: Independently reviewed. Atria Fibrillation rate 104.  QTc 462.  No significant ST or T wave changes compared to prior.  Assessment/Plan Principal Problem:   Acute on chronic diastolic CHF (congestive heart failure) (HCC) Active Problems:   Essential hypertension   PAF (paroxysmal atrial fibrillation) (HCC)   Depression with anxiety   Hypokalemia   Acute on chronic diastolic CHF-dyspnea on exertion, 1+ extremity swelling bilaterally, per charts weight stable.  On 20 mg Lasix as needed.  Compliant with Xarelto daily.  BNP elevated at 1418, compared to prior usually in the 300s.  2 view chest x-ray shows small left pleural effusion, left base atelectasis or infiltrate.  O2 sats greater than 97% on room air.  Afebrile, no cough, no leukocytosis.  Last echo 2019 EF 65 to 70%, indeterminate LV diastolic dysfunction. -Continue IV Lasix 40 every 12 hourly -Daily BMP -Strict input output, daily weights -Trend troponin -Check procalcitonin  Hypokalemia-2.9 -Check magnesium- 2. -Replete K  Atrial fibrillation-rate controlled and on anticoagulation -Resume metoprolol - Hold Xarelto for now pending CBC in the morning   Acute anemia-hemoglobin 10.3, down from baseline about 12.  Epigastric pain.  No obvious sign  of GI blood loss.  Stool FOBT negative.  Denies melena hematochezia or hematemesis.  No NSAID use. -CBC in the morning -Last dose of Xarelto was today, holding further dosing of Xarelto for now pending CBC in the morning - IV Protonix 40 daily  Hypertension-systolic 356Y to 616O, on quite a few antihypertensives. -Resume home losartan, clonidine, metoprolol - Will Adjust home hydralazine dose from 50  4 times daily to 3 times daily dosing  Recent cervical fracture-reports weakness of extremities, with 4+/5 strength on exam, but no cervical thoracic or lumbar back pain.  ?  If related to fluid overload, consider further imaging pending clinical course. -Currently on rigid cervical collar and back brace -PT evaluation -Low threshold for further imaging  Depression -Resume Ativan as needed,   DVT prophylaxis: SCDs for now Code Status: Full code Family Communication: Daughter at bedside Disposition Plan: ~ 2 days, pending diuresis. Consults called: None Admission status: Inpatient, telemetry   Bethena Roys MD [facility]  09/27/2019, 10:31 PM

## 2019-09-27 NOTE — ED Provider Notes (Signed)
Medical screening examination/treatment/procedure(s) were conducted as a shared visit with non-physician practitioner(s) and myself.  I personally evaluated the patient during the encounter.  EKG Interpretation  Date/Time:  Tuesday September 27 2019 19:41:40 EDT Ventricular Rate:  104 PR Interval:    QRS Duration: 83 QT Interval:  369 QTC Calculation: 462 R Axis:   29 Text Interpretation: Atrial fibrillation Anterior infarct, old No significant change since last tracing Confirmed by Vanetta Mulders [phone]) on 09/27/2019 8:05:58 PM   Patient seen by me along with physician assistant.  I spoke to patient's primary care provider on the phone Dr. Teola Bradley patient has a history of CHF.  Most of the lab work and even chest x-ray was done by him prior to have sending her in.  Stated that patient was having shortness of breath.  Had increased leg swelling.  That her BMPs are normally 300 and today it was like 1400.  Chest x-ray however did not have florid pulmonary edema.  We did get a formal read from radiology.  Does have some pleural effusions.  Best to treat patient with Lasix.  Patient given some oral potassium as well.  Discussed with hospitalist they will admit.   Vanetta Mulders, MD 09/27/19 2119

## 2019-09-27 NOTE — Telephone Encounter (Signed)
Please go ahead and give 

## 2019-09-27 NOTE — Telephone Encounter (Signed)
Verbal orders given to angela

## 2019-09-27 NOTE — Telephone Encounter (Signed)
Please advise. Thank you

## 2019-09-27 NOTE — ED Triage Notes (Signed)
Pt sent from Dr Fletcher Anon for acute on chronic CHF with possible GI bleed.

## 2019-09-27 NOTE — Progress Notes (Signed)
   Subjective:    Patient ID: Katherine Lane, female    DOB: 12-04-1932, 86 y.o.   MRN: [id number]  HPI  Patient arrives with extreme weakness and not able to walk this am  This patient with significant fatigue tiredness legs feel weak some shortness of breath with activity also relates epigastric discomfort that is been going on for over 24 hours neck feels very intense does not radiate into her back.  Patient is noticed some swelling in the ankle region.  Patient denies any black stools tarry stools or blood in her stools.  Patient denies substernal chest pressure  PMH underlying heart disease including atrial fibrillation on chronic anticoagulants Also has recent pulmonary effusions followed by pulmonary Also C1 fracture followed by neurosurgery they do not feel that it is surgical and they have her wearing a brace into mid September  Patient denies fever cough wheezing Review of Systems See above    Objective:   Physical Exam Lungs clear no crackles diminished breath sounds in the bases heart irregular rate is controlled extremities 1+ edema in the mid tibial region 2+ edema in the ankles patient appears to feel weak required significant assistance getting out of the car into a wheelchair  Blood pressure 114/72     Assessment & Plan:  1. DOE (dyspnea on exertion) I suspect the possibility of ongoing CHF concerning for worsening condition family hopefully does not want to go to the ER currently or get admitted last indications to do so we will go ahead and order lab work await the results - Brain natriuretic peptide - CBC with Differential/Platelet - Comprehensive metabolic panel - Lipase - Troponin I  2. Pedal edema May well need diuretic adjustment after lab work - Brain natriuretic peptide - CBC with Differential/Platelet - Comprehensive metabolic panel - Lipase - Troponin I  3. Weakness of both lower extremities Could well be electrolyte imbalance versus developing  CHF await labs - Brain natriuretic peptide - CBC with Differential/Platelet - Comprehensive metabolic panel - Lipase - Troponin I  4. Epigastric pain Start PPI, concerning for the possibility of a developing ulcer or bleeding ulcer check CBC warning signs regarding bleeding ulcers were discussed - Brain natriuretic peptide - CBC with Differential/Platelet - Comprehensive metabolic panel - Lipase - Troponin I  5. Acute on chronic diastolic CHF (congestive heart failure) (HCC) Addendum-there was concern the patient may well be developing underlying CHF.  Her BNP came back significantly elevated.  Potassium was low at 2.9 kidney functions look good.  Albumin had dropped.  Hemoglobin has dropped which raises a concern for the possibility of a bleeding ulcer  Based upon all of these factors and her weakened condition plus complex medical history it is best for the patient to go ahead to the ER for further evaluation and possible admission with consultation from cardiology  ER MD as well as triage was spoken with  Also family spoke with they will proceed on to the ER

## 2019-09-27 NOTE — ED Provider Notes (Signed)
[facility] EMERGENCY DEPARTMENT Provider Note   CSN: [id number] Arrival date & time: 09/27/19  1746     History Chief Complaint  Patient presents with  . Congestive Heart Failure    Katherine Lane is a 86 y.o. female.  Katherine Lane is a 86 y.o. female with a history of CHF, A. fib, hypertension, osteoporosis, trigeminal neuralgia, GI bleeding, C1 fracture, arthritis, who presents to the emergency department from Dr. Lance Sell office for concern for acute on chronic CHF. Patient went to her PCP due to worsening weakness and shortness of breath.  Patient is usually able to get around with some assistance but has become increasingly weak over the past 3 days and has had significant fatigue and shortness of breath with any activity.  She also reported some epigastric discomfort but no associated vomiting.  She has had some diarrhea that has been nonbloody, she has not noted any melena.  She has a C1 fracture but is not a candidate for surgery and has to wear a neck brace, reports that neck feels very tense but she does not have any focal numbness weakness or tingling.  She has noted some swelling in her lower extremities which she does not usually experience unless she is having significant heart failure exacerbation, she does not have chronic swelling.  She denies any associated chest pain or palpitations, does have a history of A. fib.  She is on chronic anticoagulation.  She denies cough, fever.  Her BNP is usually in the 300s at baseline but today was elevated at 1,418.  Dr. Wolfgang Phoenix sent her in with concern for acute on chronic CHF        Past Medical History:  Diagnosis Date  . A-fib ([place])   . Anxiety   . Arthritis   . CHF (congestive heart failure) ([place])   . Chronic back pain   . Constipation   . Constipation, chronic   . Depression   . Family history of adverse reaction to anesthesia    Mother, Sister, daughter- N/V  . History of blood transfusion    "pregnancy"  . HTN  (hypertension)   . Insomnia   . Migraine headache   . Osteoporosis   . PONV (postoperative nausea and vomiting)    "violent"  . Pulmonary edema 06/2016  . Sodium (Na) deficiency   . Trigeminal neuralgia     Patient Active Problem List   Diagnosis Date Noted  . Hypokalemia 09/27/2019  . Bilateral pleural effusion 09/26/2019  . Age-related osteoporosis without current pathological fracture 06/10/2019  . Hemorrhoids, external, with complication 46/65/9935  . Rectal bleeding   . Rectal pain   . Hemorrhoids, internal, with bleeding 01/06/2018  . Hypertensive urgency 12/16/2017  . Intractable episodic headache   . Visual changes   . Depression with anxiety   . Chronic diastolic congestive heart failure ([place]) 10/31/2017  . Atypical chest pain 10/31/2017  . Compression fracture of L1 lumbar vertebra (HCC) 02/19/2017  . Lumbar compression fracture ([place]) 12/26/2016  . Gastroesophageal reflux disease without esophagitis 07/18/2016  . Acute on chronic diastolic CHF (congestive heart failure) ([place]) 07/11/2016  . Chronic coughing 07/11/2016  . Pulmonary edema 07/10/2016  . Migraine headache 07/01/2016  . Anxiety 06/30/2016  . Altered mental status 12/06/2015  . Prolonged Q-T interval on ECG 12/06/2015  . Postural dizziness with presyncope 12/06/2015  . PAF (paroxysmal atrial fibrillation) ([place]) 07/14/2013  . Atrial flutter ([place]) 05/12/2013  . Bulging discs 04/20/2013  . Trigeminal neuralgia 01/15/2013  .  Cellulitis 07/31/2011  . Mechanical complication of internal orthopedic implant ([place]) 07/21/2011  . 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 Ankle fracture 07/02/2010  . LESION OF PLANTAR NERVE 05/14/2009  . CLOSED BIMALLEOLAR FRACTURE 03/01/2009  . Essential hypertension 11/07/2008  . CONSTIPATION, CHRONIC 11/07/2008  . Osteoporosis 11/07/2008    Past Surgical History:  Procedure Laterality Date  . ANKLE FRACTURE SURGERY Right   . CARDIAC CATHETERIZATION      denies  . CATARACT EXTRACTION W/PHACO Right 07/22/2012   Procedure: CATARACT EXTRACTION PHACO AND INTRAOCULAR LENS PLACEMENT (IOC);  Surgeon: Tonny Branch, MD;  Location: [facility];  Service: Ophthalmology;  Laterality: Right;  CDE:  18.93  . CATARACT EXTRACTION W/PHACO Left 08/09/2012   Procedure: CATARACT EXTRACTION PHACO AND INTRAOCULAR LENS PLACEMENT (IOC);  Surgeon: Tonny Branch, MD;  Location: [facility];  Service: Ophthalmology;  Laterality: Left;  CDE: 17.21  . COLONOSCOPY    . FLEXIBLE SIGMOIDOSCOPY N/A 11/19/2018   Procedure: FLEXIBLE SIGMOIDOSCOPY;  Surgeon: Danie Binder, MD;  Location: [facility];  Service: Endoscopy;  Laterality: N/A;  7:30am  . HARDWARE REMOVAL  07/18/2011   Procedure: HARDWARE REMOVAL; Right leg-  Surgeon: Carole Civil, MD;  Location: [facility];  Service: Orthopedics;  Laterality: Right;  . HEMORRHOID BANDING N/A 11/19/2018   Procedure: HEMORRHOID BANDING;  Surgeon: Danie Binder, MD;  Location: [facility];  Service: Endoscopy;  Laterality: N/A;  . KYPHOPLASTY N/A 12/26/2016   Procedure: Lumbar two Lumbar three and Lumbar five KYPHOPLASTY;  Surgeon: Erline Levine, MD;  Location: [place];  Service: Neurosurgery;  Laterality: N/A;  . KYPHOPLASTY N/A 02/19/2017   Procedure: KYPHOPLASTY LUMBAR ONE;  Surgeon: Erline Levine, MD;  Location: [place];  Service: Neurosurgery;  Laterality: N/A;  KYPHOPLASTY LUMBAR ONE  . NERVE REPAIR  07/18/2011   Procedure: NERVE REPAIR;  Surgeon: Carole Civil, MD;  Location: [facility];  Service: Orthopedics;  Laterality: Right;  Right leg superficial peroneal nerve release    . PARTIAL HYSTERECTOMY       OB History    Gravida  3   Para  3   Term  3   Preterm      AB      Living  3     SAB      TAB      Ectopic      Multiple      Live Births  3           Family History  Problem Relation Age of Onset  . Hypertension Mother   . Alzheimer's disease Mother   . Cancer Mother   . Hypertension Father   . Other Father         abused pain meds  . Diabetes Brother   . Other Brother        back problems  . Diabetes Daughter   . Anesthesia problems Neg Hx   . Hypotension Neg Hx   . Malignant hyperthermia Neg Hx   . Pseudochol deficiency Neg Hx     Social History   Tobacco Use  . Smoking status: Never Smoker  . Smokeless tobacco: Never Used  Vaping Use  . Vaping Use: Never used  Substance Use Topics  . Alcohol use: No    Alcohol/week: 0.0 standard drinks  . Drug use: No    Home Medications Prior to Admission medications   Medication Sig Start Date End Date Taking? Authorizing Provider  zolpidem (AMBIEN) 10 MG tablet Take 10  mg by mouth at bedtime as needed for sleep. 09/17/19  Yes [provider]  cloNIDine (CATAPRES) 0.1 MG tablet Take 1 tablet (0.1 mg total) by mouth every evening. At 8:00 p.m. 03/28/19   Herminio Commons, MD  furosemide (LASIX) 20 MG tablet Take 20 mg by mouth daily as needed (weight gain/fluid retention.).     [provider]  hydrALAZINE (APRESOLINE) 50 MG tablet TAKE (1) TABLET BY MOUTH (4) TIMES DAILY. Patient taking differently: Take 50 mg by mouth 4 (four) times daily.  05/19/19   Herminio Commons, MD  HYDROcodone-acetaminophen (NORCO) 5-325 MG tablet Take 1 tablet by mouth every 6 (six) hours as needed for moderate pain. Patient not taking: Reported on 09/26/2019 08/25/19   Daleen Bo, MD  hydrocortisone (ANUSOL-HC) 2.5 % rectal cream APPLY RECTALLY THREE TIMES DAILY AS DIRECTED Patient taking differently: Place 1 application rectally 2 (two) times daily as needed for hemorrhoids or anal itching.  10/19/18   Mikey Kirschner, MD  Lidocaine-Hydrocortisone Ace 3-2.5 % KIT APPLY TO RECTUM QID AS NEEDED FOR RECTAL PAIN OR BLEEDING Patient taking differently: Place 1 application rectally daily as needed (rectal pain or bleeding).  05/12/19   Fields, Marga Melnick, MD  LORazepam (ATIVAN) 0.5 MG tablet TAKE ONE TABLET BY MOUTH UP TO TWICE DAILY AS NEEDED. Patient  taking differently: Take 0.5 mg by mouth 2 (two) times daily as needed for anxiety or sleep. TAKE ONE TABLET BY MOUTH UP TO TWICE DAILY AS NEEDED. 07/11/19   Mikey Kirschner, MD  losartan (COZAAR) 50 MG tablet Take 1 tablet (50 mg total) by mouth 2 (two) times daily. Daily at 7 AM and at 7 PM 05/11/19   Herminio Commons, MD  Magnesium 250 MG TABS Take 250 mg by mouth at bedtime.     [provider]  metoprolol succinate (TOPROL-XL) 100 MG 24 hr tablet Take 1 tablet (100 mg total) by mouth daily. 02/25/19   Herminio Commons, MD  Multiple Vitamin (MULTIVITAMIN WITH MINERALS) TABS tablet Take 1 tablet by mouth daily. Centrum Silver    [provider]  Multiple Vitamins-Minerals (PRESERVISION AREDS 2 PO) Take 1 tablet by mouth 2 (two) times daily. Patient not taking: Reported on 09/26/2019    [provider]  pantoprazole (PROTONIX) 40 MG tablet Take 1 tablet (40 mg total) by mouth daily. 09/27/19   Kathyrn Drown, MD  Polyethyl Glycol-Propyl Glycol (LUBRICANT EYE DROPS) 0.4-0.3 % SOLN Place 1 drop into both eyes at bedtime.    [provider]  polyethylene glycol powder (MIRALAX) powder Take 17 g daily as needed by mouth for moderate constipation.     [provider]  potassium chloride (KLOR-CON) 10 MEQ tablet Take 1 tablet (10 mEq total) by mouth every other day. 07/29/19   Herminio Commons, MD  prochlorperazine (COMPAZINE) 5 MG tablet Take 2 tablets (10 mg total) by mouth every 8 (eight) hours as needed for nausea or vomiting. 08/25/19   Daleen Bo, MD  sertraline (ZOLOFT) 25 MG tablet Take 1 tablet (25 mg total) by mouth daily. Patient not taking: Reported on 09/26/2019 08/29/19   Kathyrn Drown, MD  shark liver oil-cocoa butter (PREPARATION H) 0.25-3-85.5 % suppository Place 1 suppository rectally 2 (two) times daily as needed for hemorrhoids.     [provider]  XARELTO 20 MG TABS tablet TAKE 1 TABLET DAILY WITH SUPPER. Patient taking  differently: Take 20 mg by mouth daily with supper.  03/22/19  Herminio Commons, MD    Allergies    Amlodipine, Codeine, Other, and Tramadol  Review of Systems   Review of Systems  Constitutional: Positive for fatigue. Negative for chills and fever.  HENT: Negative.   Respiratory: Positive for shortness of breath. Negative for cough.   Cardiovascular: Positive for leg swelling. Negative for chest pain and palpitations.  Gastrointestinal: Positive for abdominal pain. Negative for blood in stool, constipation, diarrhea, nausea and vomiting.  Genitourinary: Negative for dysuria.  Musculoskeletal: Positive for neck pain (Related to c1 fx, in brace). Negative for arthralgias and myalgias.  Skin: Negative for color change and rash.  Neurological: Positive for weakness (Generalized). Negative for syncope, light-headedness and numbness.  All other systems reviewed and are negative.   Physical Exam Updated Vital Signs BP (!) 139/94 (BP Location: Right Arm)   Pulse (!) 110   Temp 98.4 F (36.9 C) (Oral)   Resp 18   Ht 5' 2" (1.575 m)   Wt 56.2 kg   SpO2 97%   BMI 22.68 kg/m   Physical Exam Vitals and nursing note reviewed.  Constitutional:      General: She is not in acute distress.    Appearance: She is well-developed. She is not diaphoretic.     Comments: Chronically ill-appearing elderly female in neck brace, no acute distress  HENT:     Head: Normocephalic and atraumatic.     Mouth/Throat:     Mouth: Mucous membranes are moist.     Pharynx: Oropharynx is clear.  Eyes:     General:        Right eye: No discharge.        Left eye: No discharge.  Neck:     Comments: Large neck brace in place due to nonoperable C1 fracture Cardiovascular:     Rate and Rhythm: Tachycardia present. Rhythm irregular.     Pulses: Normal pulses.     Heart sounds: Normal heart sounds. No murmur heard.  No friction rub. No gallop.      Comments: Very mild tachycardia with irregularly  irregular rhythm Pulmonary:     Effort: Pulmonary effort is normal. No respiratory distress.     Breath sounds: Normal breath sounds. No wheezing or rales.     Comments: Respirations equal and unlabored and patient is able to speak in full sentences, when she lays flat she does desat to 88%, on exam she has some decreased air movement in particular in bilateral lung bases Abdominal:     General: Bowel sounds are normal. There is no distension.     Palpations: Abdomen is soft. There is no mass.     Tenderness: There is abdominal tenderness. There is no guarding.     Comments: Abdomen is soft and nondistended, bowel sounds present throughout, there is some very mild epigastric tenderness, abdomen is nontender in all quadrants, no peritoneal signs or guarding.  Musculoskeletal:        General: No deformity.     Cervical back: Neck supple.     Right lower leg: Edema present.     Left lower leg: Edema present.     Comments: There is some mild edema up to the ankle in bilateral lower extremities, both are warm and well perfused  Skin:    General: Skin is warm and dry.     Capillary Refill: Capillary refill takes less than 2 seconds.  Neurological:     Mental Status: She is alert.     Coordination: Coordination normal.  Comments: Speech is clear, able to follow commands Moves extremities without ataxia, coordination intact  Psychiatric:        Mood and Affect: Mood normal.        Behavior: Behavior normal.     ED Results / Procedures / Treatments   Labs (all labs ordered are listed, but only abnormal results are displayed) Labs Reviewed  SARS CORONAVIRUS 2 BY RT PCR [facility] ORDER, [place] LAB)  MAGNESIUM  POC OCCULT BLOOD, ED    EKG EKG Interpretation  Date/Time:  Tuesday September 27 2019 19:41:40 EDT Ventricular Rate:  104 PR Interval:    QRS Duration: 83 QT Interval:  369 QTC Calculation: 462 R Axis:   29 Text Interpretation: Atrial  fibrillation Anterior infarct, old No significant change since last tracing Confirmed by Fredia Sorrow [phone]) on 09/27/2019 8:05:58 PM   Radiology DG Chest 2 View  Result Date: 09/27/2019 CLINICAL DATA:  86 year old female with shortness of breath EXAM: CHEST - 2 VIEW COMPARISON:  Chest radiograph dated 08/25/2019. FINDINGS: Small left pleural effusion with left lung base atelectasis or infiltrate. Calcified nodule in the right mid lung field as seen previously. There is no pneumothorax. There is mild cardiomegaly. Atherosclerotic calcification of the aorta. Degenerative changes of the spine. No acute osseous pathology. Upper lumbar compression fracture and vertebroplasty. IMPRESSION: Small left pleural effusion with left lung base atelectasis or infiltrate. Electronically Signed   By: Anner Crete M.D.   On: 09/27/2019 19:47    Procedures Procedures (including critical care time)  Medications Ordered in ED Medications  potassium chloride 10 mEq in 100 mL IVPB (10 mEq Intravenous New Bag/Given 09/27/19 2109)  potassium chloride SA (KLOR-CON) CR tablet 40 mEq (40 mEq Oral Given 09/27/19 1955)  furosemide (LASIX) injection 40 mg (40 mg Intravenous Given 09/27/19 1955)    ED Course  I have reviewed the triage vital signs and the nursing notes.  Pertinent labs & imaging results that were available during my care of the patient were reviewed by me and considered in my medical decision making (see chart for details).    MDM Rules/Calculators/A&P                         86 year old female sent from PCPs office with concern for acute on chronic heart failure exacerbation.  Patient went into the office today due to worsening weakness, fatigue and shortness of breath over the past 3 days, lab work was done which showed that patient's BNP was significantly elevated at 1418, is typically in the 300s.  She has some lower extremity edema and while her chest x-ray is overall reassuring she has a  small left pleural effusion, but when she lays flat she desats to 88%, was placed on 2 L nasal cannula.  Her work-up at the PCPs office also showed a two-point drop in her hemoglobin, she does have a history of GI bleeding, but has not noticed any blood in her stools.  Hemoccult negative today in the ED.  Work-up also significant for hypokalemia of 2.9, no other significant electrolyte derangements.  Will check magnesium level.  Will give p.o. and IV potassium replacement and also start IV Lasix.  Patient will require admission for acute on chronic heart failure exacerbation as well as electrolyte repletion.  Case discussed with Dr. Denton Brick with [facility] hospitalist who will see and admit the patient  Final Clinical Impression(s) / ED Diagnoses Final diagnoses:  Acute on chronic  congestive heart failure, unspecified heart failure type [facility])    Rx / DC Orders ED Discharge Orders    None       Janet Berlin 09/27/19 2125    Fredia Sorrow, MD 09/27/19 2212

## 2019-09-28 ENCOUNTER — Inpatient Hospital Stay (HOSPITAL_COMMUNITY): Payer: Medicare PPO

## 2019-09-28 DIAGNOSIS — I480 Paroxysmal atrial fibrillation: Secondary | ICD-10-CM

## 2019-09-28 DIAGNOSIS — I10 Essential (primary) hypertension: Secondary | ICD-10-CM

## 2019-09-28 DIAGNOSIS — I5031 Acute diastolic (congestive) heart failure: Secondary | ICD-10-CM

## 2019-09-28 DIAGNOSIS — E876 Hypokalemia: Secondary | ICD-10-CM

## 2019-09-28 LAB — CBC
HCT: 35.1 % — ABNORMAL LOW (ref 36.0–46.0)
Hemoglobin: 11.1 g/dL — ABNORMAL LOW (ref 12.0–15.0)
MCH: 28.5 pg (ref 26.0–34.0)
MCHC: 31.6 g/dL (ref 30.0–36.0)
MCV: 90 fL (ref 80.0–100.0)
Platelets: 389 10*3/uL (ref 150–400)
RBC: 3.9 MIL/uL (ref 3.87–5.11)
RDW: 16.1 % — ABNORMAL HIGH (ref 11.5–15.5)
WBC: 7.2 10*3/uL (ref 4.0–10.5)
nRBC: 0 % (ref 0.0–0.2)

## 2019-09-28 LAB — ECHOCARDIOGRAM COMPLETE
AR max vel: 1.53 cm2
AV Area VTI: 1.88 cm2
AV Area mean vel: 1.51 cm2
AV Mean grad: 5.1 mmHg
AV Peak grad: 9.2 mmHg
Ao pk vel: 1.52 m/s
Area-P 1/2: 4.8 cm2
Height: 62 in
S' Lateral: 2.04 cm
Weight: 1984 oz

## 2019-09-28 LAB — BASIC METABOLIC PANEL
Anion gap: 11 (ref 5–15)
BUN: 14 mg/dL (ref 8–23)
CO2: 25 mmol/L (ref 22–32)
Calcium: 8.5 mg/dL — ABNORMAL LOW (ref 8.9–10.3)
Chloride: 99 mmol/L (ref 98–111)
Creatinine, Ser: 0.82 mg/dL (ref 0.44–1.00)
GFR calc Af Amer: >60 mL/min (ref >60)
GFR calc non Af Amer: >60 mL/min (ref >60)
Glucose, Bld: 95 mg/dL (ref 70–99)
Potassium: 4.2 mmol/L (ref 3.5–5.1)
Sodium: 135 mmol/L (ref 135–145)

## 2019-09-28 MED ORDER — RIVAROXABAN 15 MG PO TABS
15.0000 mg | ORAL_TABLET | Freq: Every day | ORAL | Status: DC
  Filled 2019-09-28: qty 1

## 2019-09-28 MED ORDER — RIVAROXABAN 20 MG PO TABS: 20.0000 mg | ORAL_TABLET | Freq: Every day | ORAL | Status: DC

## 2019-09-28 MED ORDER — DILTIAZEM HCL 30 MG PO TABS
30.0000 mg | ORAL_TABLET | Freq: Three times a day (TID) | ORAL | Status: DC
  Administered 2019-09-28 – 2019-09-29 (×3): 30 mg via ORAL
  Filled 2019-09-28 (×3): qty 1

## 2019-09-28 NOTE — TOC Initial Note (Signed)
Transition of Care [facility]) - Initial/Assessment Note   Patient Details  Name: Katherine Lane MRN: [id number] Date of Birth: [birth year]  Transition of Care [facility]) CM/SW Contact:    Ewing Schlein, LCSW Phone Number: 09/28/2019, 11:35 AM  Clinical Narrative: Patient is an 86 year old female who was admitted for acute on chronic diastolic CHF. Patient has a history of hypertension, paroxysmal atrial fibrillation, depression with anxiety, and hypokalemia. TOC received CHF consult. CSW spoke with patient's daughter on the phone as patient has difficulty using a phone due to being in a brace. Per daughter, patient lives at home alone and was independent with ADLs and IADLs prior to her accident that put the patient in a brace from the waist up. Patient is currently active with [facility] (PT/OT/RN) and has a walker, cane, and raised toilet seat at home. Daughter stated patient will likely need a shower chair once her brace is removed in a few weeks. Daughter reported patient is able to feed herself independently, but requires extensive assistance with bathing and dressing due to the brace. Daughter reported patient can afford her medications monthly and takes these as prescribed.  Per CHF screening, daughter reported the patient follows a heart healthy diet and restricts her salt intake. Daughter informed CSW patient has not yet been instructed her restrict her fluid intake, but monitors her weight daily which she has done for several years. Patient and daughter declined possible SNF placement at this time.  CSW made referral to [facility] for shower chair. CSW followed up with Tim with [facility] to set up resumption of HH services. TOC to follow.  Expected Discharge Plan: Home w Home Health Services Barriers to Discharge: Continued Medical Work up  Patient Goals and CMS Choice Patient states their goals for this hospitalization and ongoing recovery are:: Return home CMS Medicare.gov Compare Post Acute Care list  provided to:: Patient Choice offered to / list presented to : Patient  Expected Discharge Plan and Services Expected Discharge Plan: Home w Home Health Services In-house Referral: Clinical Social Work Discharge Planning Services: NA Post Acute Care Choice: Durable Medical Equipment, Home Health Living arrangements for the past 2 months: Single Family Home             DME Arranged: Shower stool DME Agency: [facility] Date DME Agency Contacted: 09/28/19 Representative spoke with at DME Agency: Efraim Kaufmann HH Arranged: RN, PT, OT HH Agency: [facility] (formerly [facility]) Date HH Agency Contacted: 09/28/19 Time HH Agency Contacted: 1025 Representative spoke with at [facility] Agency: Tim  Prior Living Arrangements/Services Living arrangements for the past 2 months: Single Family Home Lives with:: Self Patient language and need for interpreter reviewed:: Yes Do you feel safe going back to the place where you live?: Yes      Need for Family Participation in Patient Care: Yes (Comment) Care giver support system in place?: Yes (comment) (Daughters) Current home services: DME, Home OT, Home PT, Home RN (HH: PT/OT/RN; DME: walker, cane, raised toilet seat) Criminal Activity/Legal Involvement Pertinent to Current Situation/Hospitalization: No - Comment as needed  Activities of Daily Living Home Assistive Devices/Equipment: C-collar, Bedside commode/3-in-1 ADL Screening (condition at time of admission) Patient's cognitive ability adequate to safely complete daily activities?: Yes Is the patient deaf or have difficulty hearing?: No Does the patient have difficulty seeing, even when wearing glasses/contacts?: No Does the patient have difficulty concentrating, remembering, or making decisions?: No Patient able to express need for assistance with ADLs?: Yes Does the  patient have difficulty dressing or bathing?: Yes Independently performs ADLs?: No Communication: Independent Dressing  (OT): Needs assistance Is this a change from baseline?: Pre-admission baseline Grooming: Needs assistance Is this a change from baseline?: Pre-admission baseline Feeding: Independent Bathing: Needs assistance Is this a change from baseline?: Pre-admission baseline Toileting: Needs assistance Is this a change from baseline?: Pre-admission baseline In/Out Bed: Needs assistance Is this a change from baseline?: Pre-admission baseline Walks in Home: Needs assistance Is this a change from baseline?: Pre-admission baseline Does the patient have difficulty walking or climbing stairs?: Yes Weakness of Legs: Both Weakness of Arms/Hands: Both  Permission Sought/Granted Permission sought to share information with : Facility Industrial/product designer granted to share information with : Yes, Verbal Permission Granted Permission granted to share info w AGENCY: [facility]; [facility]  Emotional Assessment Appearance:: Appears stated age Attitude/Demeanor/Rapport: Engaged Affect (typically observed): Accepting Orientation: : Oriented to Self, Oriented to Place, Oriented to  Time, Oriented to Situation Alcohol / Substance Use: Not Applicable Psych Involvement: No (comment)  Admission diagnosis:  Hypokalemia [E87.6] Acute on chronic diastolic CHF (congestive heart failure) (HCC) [I50.33] Patient Active Problem List   Diagnosis Date Noted  . Hypokalemia 09/27/2019  . Bilateral pleural effusion 09/26/2019  . Age-related osteoporosis without current pathological fracture 06/10/2019  . Hemorrhoids, external, with complication 05/05/2019  . Rectal bleeding   . Rectal pain   . Hemorrhoids, internal, with bleeding 01/06/2018  . Hypertensive urgency 12/16/2017  . Intractable episodic headache   . Visual changes   . Depression with anxiety   . Chronic diastolic congestive heart failure (HCC) 10/31/2017  . Atypical chest pain 10/31/2017  . Compression fracture of L1 lumbar vertebra (HCC) 02/19/2017   . Lumbar compression fracture (HCC) 12/26/2016  . Gastroesophageal reflux disease without esophagitis 07/18/2016  . Acute on chronic diastolic CHF (congestive heart failure) (HCC) 07/11/2016  . Chronic coughing 07/11/2016  . Pulmonary edema 07/10/2016  . Migraine headache 07/01/2016  . Anxiety 06/30/2016  . Altered mental status 12/06/2015  . Prolonged Q-T interval on ECG 12/06/2015  . Postural dizziness with presyncope 12/06/2015  . PAF (paroxysmal atrial fibrillation) (HCC) 07/14/2013  . Atrial flutter (HCC) 05/12/2013  . Bulging discs 04/20/2013  . Trigeminal neuralgia 01/15/2013  . Cellulitis 07/31/2011  . Mechanical complication of internal orthopedic implant (HCC) 07/21/2011  . 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 Ankle fracture 07/02/2010  . LESION OF PLANTAR NERVE 05/14/2009  . CLOSED BIMALLEOLAR FRACTURE 03/01/2009  . Essential hypertension 11/07/2008  . CONSTIPATION, CHRONIC 11/07/2008  . Osteoporosis 11/07/2008   PCP:  Babs Sciara, MD Pharmacy:   [facility] - [place], [place] - [phone] [place] [address] Phone: [phone] Fax: [phone]  Readmission Risk Interventions No flowsheet data found.

## 2019-09-28 NOTE — Progress Notes (Signed)
*  PRELIMINARY RESULTS* Echocardiogram 2D Echocardiogram has been performed.  Katherine Lane 09/28/2019, 9:43 AM

## 2019-09-28 NOTE — ED Notes (Signed)
Meal tray with set up provided. Pt dtr in room at bedside

## 2019-09-28 NOTE — ED Notes (Signed)
Prophylactic Sacral foam dressing applied to sacrum. No skin issues noted at this time. Pt lying flat in bed due to cervical/back brace.

## 2019-09-28 NOTE — ED Notes (Signed)
Pt requesting something for her nerves at this time RN and MD notified.

## 2019-09-28 NOTE — Progress Notes (Signed)
PROGRESS NOTE    Katherine Lane  ZTI:[id number] DOB: 05/22/1932 DOA: 09/27/2019 PCP: Babs Sciara, MD    Brief Narrative:  HPI: Katherine Lane is a 86 y.o. female with medical history significant for acute on chronic diastolic CHF, atrial flutter, hypertension, depression. Patient was sent from primary care provider's office with complaints of difficulty breathing on exertion, leg swelling over the past 2-1/2 weeks.  Patient has chronic breathing problems.  Patient also reports upper abdominal pain.  She denies black stools, no vomiting of blood.  No blood in stools.  No NSAID use.  Daughter reports patient's leg usually never swell, when she has excess fluid.  Patient checks her weight daily, and weight has remained stable at about 123 pounds.  Patient fell from her bed 1 month ago, for which was seen in the ED 7/8 was placed on a neck and back brace, recommended by neurosurgery.  She was not a candidate for surgery.   CT spine showed acute comminuted nondisplaced fracture of the left lateral mass of C1.  Subsequent CTA neck showed grade 1 blunt cerebrovascular injury of the left vertebral artery at the C1 transverse foramen.  Patient reports neck and back brace sometimes limits her breathing, but she is able to lie almost flat.  No chest pain.  No cough.  She takes 20mg  Lasix as needed.  She reports compliance with her Xarelto. Patient reports bilateral lower extremities have felt weak, she walks with a walker or a cane.  She has no back pain or neck pain, despite fracture and collar.  ED Course: Temperature 98.4, blood pressure 130s to 160s, heart rate 90s to low 100s, O2 sats greater than 97% on room air. Most of the blood work was done by primary care provider prior to arrival, BNP elevated at 1418 compared to prior - 300s.  Potassium 2.9.  Hemoglobin 10.3.  Troponin 12.  Small left pleural effusion with left base atelectasis or infiltrate. 40 mg IV Lasix given, IV KCL x 2.   Hospitalist to admit for decompensated CHF and possible gi bleed.   Assessment & Plan:   Principal Problem:   Acute on chronic diastolic CHF (congestive heart failure) (HCC) Active Problems:   Essential hypertension   PAF (paroxysmal atrial fibrillation) (HCC)   Depression with anxiety   Hypokalemia   1. Acute respiratory failure with hypoxia. Secondary to decompensated CHF. Continue to wean off oxygen as tolerated 2. Acute on chronic diastolic CHF. Patient overall volume status is -2.9L. she continues to have evidence of volume overload. Continue on IV lasix.  She is already on Toprol, ARB. 3. Paroxysmal atrial fibrillation.  Currently with rapid ventricular response.  She is continued on her home dose of Toprol.  We will add low-dose diltiazem.  Potentially, if her atrial fibrillation has been controlled this could precipitate her CHF exacerbation.  She is anticoagulated with Xarelto. 4. Hypokalemia.  Replaced. 5. Anemia, mild, hemoglobin approximately 11.  No signs of bleeding at this time.  FOBT negative.  Continue to monitor. 6. Recent cervical fracture.  Continues to wear c-collar.  Continue to follow with neurosurgery.  I suspect generalized weakness with more related to hypokalemia.  Continue physical therapy. 7. Hypertension.  Continued on losartan, clonidine and metoprolol. 8. Recent vertebral artery dissection.  Continued on anticoagulation.  Follow-up with vascular surgery.   DVT prophylaxis: SCDs Start: 09/27/19 2238 Xarelto Code Status: full code Family Communication: dicussed with daughter at the bedside Disposition Plan: Status is: Inpatient  Remains inpatient appropriate because:IV treatments appropriate due to intensity of illness or inability to take PO   Dispo: The patient is from: Home              Anticipated d/c is to: Home              Anticipated d/c date is: 1 day              Patient currently is not medically stable to d/c.   Consultants:      Procedures:     Antimicrobials:       Subjective: Shortness of breath improving. Overall generalized weakness is better. No chest pain  Objective: Vitals:   09/28/19 1300 09/28/19 1400 09/28/19 1500 09/28/19 1622  BP: (!) 131/119 117/90 98/62 139/88  Pulse: 70 99 77 87  Resp: (!) 29 (!) 32 (!) 24 20  Temp:    97.8 F (36.6 C)  TempSrc:    Oral  SpO2: 100% 99% 100% 100%  Weight:      Height:        Intake/Output Summary (Last 24 hours) at 09/28/2019 1918 Last data filed at 09/28/2019 1330 Gross per 24 hour  Intake 120 ml  Output 3100 ml  Net -2980 ml   Filed Weights   09/27/19 1800  Weight: 56.2 kg    Examination:  General exam: Appears calm and comfortable,  HEENT: neck is in C collar  Respiratory system: crackles at bases. Respiratory effort normal. Cardiovascular system: irregular rate and rhythm. No JVD, murmurs, rubs, gallops or clicks.  Gastrointestinal system: Abdomen is nondistended, soft and nontender. No organomegaly or masses felt. Normal bowel sounds heard. Central nervous system: Alert and oriented. No focal neurological deficits. Extremities: 1+ edema bilaterally Skin: No rashes, lesions or ulcers Psychiatry: Judgement and insight appear normal. Mood & affect appropriate.     Data Reviewed: I have personally reviewed following labs and imaging studies  CBC: Recent Labs  Lab 09/27/19 1535 09/28/19 0336  WBC 6.1 7.2  NEUTROABS 4.2  --   HGB 10.3* 11.1*  HCT 32.9* 35.1*  MCV 89.4 90.0  PLT 396 389   Basic Metabolic Panel: Recent Labs  Lab 09/27/19 1535 09/27/19 1950 09/28/19 0336  NA 131*  --  135  K 2.9*  --  4.2  CL 99  --  99  CO2 24  --  25  GLUCOSE 105*  --  95  BUN 15  --  14  CREATININE 0.70  --  0.82  CALCIUM 8.3*  --  8.5*  MG  --  2.0  --    GFR: Estimated Creatinine Clearance: 40.4 mL/min (by C-G formula based on SCr of 0.82 mg/dL). Liver Function Tests: Recent Labs  Lab 09/27/19 1535  AST 22  ALT 15   ALKPHOS 83  BILITOT 1.3*  PROT 6.2*  ALBUMIN 2.9*   Recent Labs  Lab 09/27/19 1535  LIPASE 28   No results for input(s): AMMONIA in the last 168 hours. Coagulation Profile: No results for input(s): INR, PROTIME in the last 168 hours. Cardiac Enzymes: No results for input(s): CKTOTAL, CKMB, CKMBINDEX, TROPONINI in the last 168 hours. BNP (last 3 results) No results for input(s): PROBNP in the last 8760 hours. HbA1C: No results for input(s): HGBA1C in the last 72 hours. CBG: No results for input(s): GLUCAP in the last 168 hours. Lipid Profile: No results for input(s): CHOL, HDL, LDLCALC, TRIG, CHOLHDL, LDLDIRECT in the last 72 hours. Thyroid Function Tests: No  results for input(s): TSH, T4TOTAL, FREET4, T3FREE, THYROIDAB in the last 72 hours. Anemia Panel: No results for input(s): VITAMINB12, FOLATE, FERRITIN, TIBC, IRON, RETICCTPCT in the last 72 hours. Sepsis Labs: Recent Labs  Lab 09/27/19 1950  PROCALCITON <0.10    Recent Results (from the past 240 hour(s))  SARS Coronavirus 2 by RT PCR (hospital order, performed in [facility] hospital lab) Nasopharyngeal Nasopharyngeal Swab     Status: None   Collection Time: 09/27/19  8:13 PM   Specimen: Nasopharyngeal Swab  Result Value Ref Range Status   SARS Coronavirus 2 NEGATIVE NEGATIVE Final    Comment: (NOTE) SARS-CoV-2 target nucleic acids are NOT DETECTED.  The SARS-CoV-2 RNA is generally detectable in upper and lower respiratory specimens during the acute phase of infection. The lowest concentration of SARS-CoV-2 viral copies this assay can detect is 250 copies / mL. A negative result does not preclude SARS-CoV-2 infection and should not be used as the sole basis for treatment or other patient management decisions.  A negative result may occur with improper specimen collection / handling, submission of specimen other than nasopharyngeal swab, presence of viral mutation(s) within the areas targeted by this assay,  and inadequate number of viral copies (<250 copies / mL). A negative result must be combined with clinical observations, patient history, and epidemiological information.  Fact Sheet for Patients:   BoilerBrush.com.cy  Fact Sheet for Healthcare Providers: https://pope.com/  This test is not yet approved or  cleared by the [place] FDA and has been authorized for detection and/or diagnosis of SARS-CoV-2 by FDA under an Emergency Use Authorization (EUA).  This EUA will remain in effect (meaning this test can be used) for the duration of the COVID-19 declaration under Section 564(b)(1) of the Act, 21 U.S.C. section 360bbb-3(b)(1), unless the authorization is terminated or revoked sooner.  Performed at [facility], [address], [place], [place] [address]          Radiology Studies: DG Chest 2 View  Result Date: 09/27/2019 CLINICAL DATA:  86 year old female with shortness of breath EXAM: CHEST - 2 VIEW COMPARISON:  Chest radiograph dated 08/25/2019. FINDINGS: Small left pleural effusion with left lung base atelectasis or infiltrate. Calcified nodule in the right mid lung field as seen previously. There is no pneumothorax. There is mild cardiomegaly. Atherosclerotic calcification of the aorta. Degenerative changes of the spine. No acute osseous pathology. Upper lumbar compression fracture and vertebroplasty. IMPRESSION: Small left pleural effusion with left lung base atelectasis or infiltrate. Electronically Signed   By: Elgie Collard M.D.   On: 09/27/2019 19:47   ECHOCARDIOGRAM COMPLETE  Result Date: 09/28/2019    ECHOCARDIOGRAM REPORT   Patient Name:   Katherine Lane Date of Exam: 09/28/2019 Medical Rec #:  [id number]      Height:       62.0 in Accession #:    [id number]     Weight:       124.0 lb Date of Birth:  [birth year]     BSA:          1.560 m Patient Age:    86 years       BP:           126/77 mmHg Patient Gender: F               HR:           121 bpm. [facility] Procedure: 2D Echo Indications:    CHF-Acute Diastolic 428.31 / I50.31  History:        Patient has prior history of Echocardiogram examinations, most                 recent 11/01/2017. CHF, Arrythmias:Atrial Fibrillation and Atrial                 Flutter; Risk Factors:Hypertension and Non-Smoker.  Sonographer:    Jeryl Columbia RDCS (AE) Referring Phys: [id number] Hel[address] Beecham [facility] IMPRESSIONS  1. Left ventricular ejection fraction, by estimation, is 60 to 65%. The left ventricle has normal function. The left ventricle has no regional wall motion abnormalities. There is moderate left ventricular hypertrophy. Left ventricular diastolic parameters are consistent with Grade II diastolic dysfunction (pseudonormalization). Elevated left atrial pressure.  2. Right ventricular systolic function is mildly reduced. The right ventricular size is moderately enlarged.  3. Left atrial size was severely dilated.  4. Right atrial size was severely dilated.  5. The mitral valve is normal in structure. No evidence of mitral valve regurgitation. No evidence of mitral stenosis.  6. The aortic valve is tricuspid. Aortic valve regurgitation is not visualized. No aortic stenosis is present.  7. Severe pulmonary HTN, PASP is 45 mmHg.  8. The inferior vena cava is dilated in size with <50% respiratory variability, suggesting right atrial pressure of 15 mmHg. FINDINGS  Left Ventricle: Left ventricular ejection fraction, by estimation, is 60 to 65%. The left ventricle has normal function. The left ventricle has no regional wall motion abnormalities. The left ventricular internal cavity size was normal in size. There is  moderate left ventricular hypertrophy. Left ventricular diastolic parameters are consistent with Grade II diastolic dysfunction (pseudonormalization). Elevated left atrial pressure. Right Ventricle: The right ventricular size is moderately enlarged. No increase in right  ventricular wall thickness. Right ventricular systolic function is mildly reduced. The tricuspid regurgitant velocity is 2.92 m/s, and with an assumed right atrial pressure of 10 mmHg, the estimated right ventricular systolic pressure is 44.1 mmHg. Left Atrium: Left atrial size was severely dilated. Right Atrium: Right atrial size was severely dilated. Pericardium: There is no evidence of pericardial effusion. Mitral Valve: The mitral valve is normal in structure. There is mild thickening of the mitral valve leaflet(s). There is mild calcification of the mitral valve leaflet(s). Mild mitral annular calcification. No evidence of mitral valve regurgitation. No evidence of mitral valve stenosis. Tricuspid Valve: The tricuspid valve is normal in structure. Tricuspid valve regurgitation is mild . No evidence of tricuspid stenosis. Aortic Valve: The aortic valve is tricuspid. . There is mild thickening and mild calcification of the aortic valve. Aortic valve regurgitation is not visualized. No aortic stenosis is present. Mild aortic valve annular calcification. There is mild thickening of the aortic valve. There is mild calcification of the aortic valve. Aortic valve mean gradient measures 5.1 mmHg. Aortic valve peak gradient measures 9.2 mmHg. Aortic valve area, by VTI measures 1.88 cm. Pulmonic Valve: The pulmonic valve was not well visualized. Pulmonic valve regurgitation is mild. No evidence of pulmonic stenosis. Aorta: The aortic root is normal in size and structure. Pulmonary Artery: Severe pulmonary HTN, PASP is 45 mmHg. Venous: The inferior vena cava is dilated in size with less than 50% respiratory variability, suggesting right atrial pressure of 15 mmHg. IAS/Shunts: The interatrial septum was not well visualized.  LEFT VENTRICLE PLAX 2D LVIDd:         3.05 cm  Diastology LVIDs:         2.04 cm  LV e' lateral:  7.72 cm/s LV PW:         1.20 cm  LV E/e' lateral: 14.1 LV IVS:        1.20 cm  LV e' medial:    5.98  cm/s LVOT diam:     1.80 cm  LV E/e' medial:  18.2 LV SV:         46 LV SV Index:   29 LVOT Area:     2.54 cm  RIGHT VENTRICLE RV S prime:     7.94 cm/s TAPSE (M-mode): 1.4 cm LEFT ATRIUM           Index       RIGHT ATRIUM           Index LA diam:      2.80 cm 1.79 cm/m  RA Area:     17.20 cm LA Vol (A2C): 29.7 ml 19.04 ml/m RA Volume:   39.40 ml  25.25 ml/m LA Vol (A4C): 65.0 ml 41.66 ml/m  AORTIC VALVE AV Area (Vmax):    1.53 cm AV Area (Vmean):   1.51 cm AV Area (VTI):     1.88 cm AV Vmax:           152.03 cm/s AV Vmean:          111.343 cm/s AV VTI:            0.243 m AV Peak Grad:      9.2 mmHg AV Mean Grad:      5.1 mmHg LVOT Vmax:         91.69 cm/s LVOT Vmean:        66.159 cm/s LVOT VTI:          0.179 m LVOT/AV VTI ratio: 0.74  AORTA Ao Root diam: 2.70 cm MITRAL VALVE                TRICUSPID VALVE MV Area (PHT): 4.80 cm     TR Peak grad:   34.1 mmHg MV Decel Time: 158 msec     TR Vmax:        292.00 cm/s MV E velocity: 109.00 cm/s MV A velocity: 53.60 cm/s   SHUNTS MV E/A ratio:  2.03         Systemic VTI:  0.18 m                             Systemic Diam: 1.80 cm Dina Rich MD Electronically signed by Dina Rich MD Signature Date/Time: 09/28/2019/11:35:38 AM    Final         Scheduled Meds: . cloNIDine  0.1 mg Oral QPM  . diltiazem  30 mg Oral TID  . furosemide  40 mg Intravenous Q12H  . hydrALAZINE  50 mg Oral Q8H  . losartan  50 mg Oral BID  . metoprolol succinate  100 mg Oral Daily  . pantoprazole (PROTONIX) IV  40 mg Intravenous Q24H  . sodium chloride flush  3 mL Intravenous Q12H   Continuous Infusions:   LOS: 1 day    Time spent:    Erick Blinks, MD [facility]   If 7PM-7AM, please contact night-coverage [email or web address]  09/28/2019, 7:18 PM

## 2019-09-29 DIAGNOSIS — I509 Heart failure, unspecified: Secondary | ICD-10-CM

## 2019-09-29 DIAGNOSIS — F418 Other specified anxiety disorders: Secondary | ICD-10-CM

## 2019-09-29 LAB — BASIC METABOLIC PANEL
Anion gap: 12 (ref 5–15)
BUN: 18 mg/dL (ref 8–23)
CO2: 29 mmol/L (ref 22–32)
Calcium: 8.8 mg/dL — ABNORMAL LOW (ref 8.9–10.3)
Chloride: 94 mmol/L — ABNORMAL LOW (ref 98–111)
Creatinine, Ser: 0.87 mg/dL (ref 0.44–1.00)
GFR calc Af Amer: >60 mL/min (ref >60)
GFR calc non Af Amer: >60 mL/min (ref >60)
Glucose, Bld: 101 mg/dL — ABNORMAL HIGH (ref 70–99)
Potassium: 3.3 mmol/L — ABNORMAL LOW (ref 3.5–5.1)
Sodium: 135 mmol/L (ref 135–145)

## 2019-09-29 LAB — CBC
HCT: 34.9 % — ABNORMAL LOW (ref 36.0–46.0)
Hemoglobin: 11.1 g/dL — ABNORMAL LOW (ref 12.0–15.0)
MCH: 29 pg (ref 26.0–34.0)
MCHC: 31.8 g/dL (ref 30.0–36.0)
MCV: 91.1 fL (ref 80.0–100.0)
Platelets: 446 10*3/uL — ABNORMAL HIGH (ref 150–400)
RBC: 3.83 MIL/uL — ABNORMAL LOW (ref 3.87–5.11)
RDW: 16.3 % — ABNORMAL HIGH (ref 11.5–15.5)
WBC: 6.6 10*3/uL (ref 4.0–10.5)
nRBC: 0 % (ref 0.0–0.2)

## 2019-09-29 MED ORDER — POTASSIUM CHLORIDE CRYS ER 20 MEQ PO TBCR
40.0000 meq | EXTENDED_RELEASE_TABLET | Freq: Once | ORAL | Status: AC
  Administered 2019-09-29: 40 meq via ORAL
  Filled 2019-09-29: qty 2

## 2019-09-29 MED ORDER — HYDRALAZINE HCL 50 MG PO TABS: 50.0000 mg | ORAL_TABLET | Freq: Three times a day (TID) | ORAL | 6 refills | Status: DC

## 2019-09-29 MED ORDER — DILTIAZEM HCL ER COATED BEADS 120 MG PO CP24
120.0000 mg | ORAL_CAPSULE | Freq: Every day | ORAL | 2 refills | Status: DC
Start: 2019-09-29 — End: 2019-12-26

## 2019-09-29 MED ORDER — FUROSEMIDE 40 MG PO TABS
40.0000 mg | ORAL_TABLET | Freq: Every day | ORAL | Status: DC
  Administered 2019-09-29: 40 mg via ORAL
  Filled 2019-09-29: qty 1

## 2019-09-29 MED ORDER — FUROSEMIDE 40 MG PO TABS: 40.0000 mg | ORAL_TABLET | Freq: Every day | ORAL | 3 refills | Status: DC

## 2019-09-29 MED ORDER — POTASSIUM CHLORIDE CRYS ER 10 MEQ PO TBCR: 10.0000 meq | EXTENDED_RELEASE_TABLET | Freq: Every day | ORAL | 3 refills | Status: DC

## 2019-09-29 NOTE — Progress Notes (Signed)
Discharge instructions reviewed with patient and daughter at bedside, given AVS. Follow-up with PCP and cardiology scheduled and noted on AVS. Verbalized understanding of instructions and to pick up prescriptions from [facility]. IV site removed, within normal limits. Patient left floor in stable condition via w/c accompanied by nursing staff. Discharged home.

## 2019-09-29 NOTE — Discharge Summary (Signed)
Physician Discharge Summary   Patient ID: XOEY WARMOTH MRN: [id number] DOB/AGE: October 30, 1932 86 y.o.  Admit date: 09/27/2019 Discharge date: 09/29/2019  Primary Care Physician:  Kathyrn Drown, MD   Recommendations for Outpatient Follow-up:  1. Follow up with PCP in 1-2 weeks 2. Please obtain BMP in one week  3. Clonidine 0.1 mg daily has been discontinued.  Patient was started on low-dose Cardizem for atrial fibrillation with RVR which also contributed to CHF exacerbation. 4. Started on Lasix 40 mg daily, patient needs renal function checked in 1 week.  Home Health: Home health PT OT Equipment/Devices:   Discharge Condition: stable CODE STATUS: FULL  Diet recommendation: Heart healthy diet   Discharge Diagnoses:     Acute respiratory failure with hypoxia, O2 weaned off at the time of discharge . Hypokalemia . Essential hypertension . PAF (paroxysmal atrial fibrillation) (HCC) with RVR . Acute on chronic diastolic CHF (congestive heart failure) ([place]) . Depression with anxiety Chronic normocytic anemia, mild Recent cervical fracture with c-collar on Recent vertebral artery dissection   Consults: None   Allergies:   Allergies  Allergen Reactions  . Amlodipine Swelling    Leg swelling   . Codeine Nausea And Vomiting and Other (See Comments)    Patient states "intolerance to all pain medications"  (NO OPIOIDS) VERY VIOLENT VOMITING!!  . Other Nausea And Vomiting and Other (See Comments)    general anesthesia  . Tramadol Nausea And Vomiting and Other (See Comments)    "NO OPIOIDS!!! VERY VIOLENT VOMITING!!" per Med History prior to 02/18/17     DISCHARGE MEDICATIONS: Allergies as of 09/29/2019      Reactions   Amlodipine Swelling   Leg swelling   Codeine Nausea And Vomiting, Other (See Comments)   Patient states "intolerance to all pain medications"  (NO OPIOIDS) VERY VIOLENT VOMITING!!   Other Nausea And Vomiting, Other (See Comments)   general anesthesia    Tramadol Nausea And Vomiting, Other (See Comments)   "NO OPIOIDS!!! VERY VIOLENT VOMITING!!" per Med History prior to 02/18/17      Medication List    STOP taking these medications   cloNIDine 0.1 MG tablet Commonly known as: Catapres     TAKE these medications   diltiazem 120 MG 24 hr capsule Commonly known as: Cardizem CD Take 1 capsule (120 mg total) by mouth daily.   furosemide 40 MG tablet Commonly known as: LASIX Take 1 tablet (40 mg total) by mouth daily. What changed:   medication strength  how much to take  when to take this  reasons to take this   hydrALAZINE 50 MG tablet Commonly known as: APRESOLINE Take 1 tablet (50 mg total) by mouth 3 (three) times daily. What changed: See the new instructions.   hydrocortisone 2.5 % rectal cream Commonly known as: ANUSOL-HC APPLY RECTALLY THREE TIMES DAILY AS DIRECTED What changed: See the new instructions.   Lidocaine-Hydrocortisone Ace 3-2.5 % Kit APPLY TO RECTUM QID AS NEEDED FOR RECTAL PAIN OR BLEEDING What changed:   how much to take  how to take this  when to take this  reasons to take this  additional instructions   LORazepam 0.5 MG tablet Commonly known as: ATIVAN TAKE ONE TABLET BY MOUTH UP TO TWICE DAILY AS NEEDED. What changed: See the new instructions.   losartan 50 MG tablet Commonly known as: COZAAR Take 1 tablet (50 mg total) by mouth 2 (two) times daily. Daily at 7 AM and at 7 PM  Lubricant Eye Drops 0.4-0.3 % Soln Generic drug: Polyethyl Glycol-Propyl Glycol Place 1 drop into both eyes at bedtime.   Magnesium 250 MG Tabs Take 250 mg by mouth at bedtime.   metoprolol succinate 100 MG 24 hr tablet Commonly known as: TOPROL-XL Take 1 tablet (100 mg total) by mouth daily.   MiraLax 17 GM/SCOOP powder Generic drug: polyethylene glycol powder Take 17 g daily as needed by mouth for moderate constipation.   multivitamin with minerals Tabs tablet Take 1 tablet by mouth daily.  Centrum Silver   pantoprazole 40 MG tablet Commonly known as: PROTONIX Take 1 tablet (40 mg total) by mouth daily.   potassium chloride 10 MEQ tablet Commonly known as: KLOR-CON Take 1 tablet (10 mEq total) by mouth daily. What changed: when to take this   prochlorperazine 5 MG tablet Commonly known as: COMPAZINE Take 2 tablets (10 mg total) by mouth every 8 (eight) hours as needed for nausea or vomiting.   shark liver oil-cocoa butter 0.25-3-85.5 % suppository Commonly known as: PREPARATION H Place 1 suppository rectally 2 (two) times daily as needed for hemorrhoids.   Xarelto 20 MG Tabs tablet Generic drug: rivaroxaban TAKE 1 TABLET DAILY WITH SUPPER. What changed: See the new instructions.   zolpidem 10 MG tablet Commonly known as: AMBIEN Take 10 mg by mouth at bedtime as needed for sleep.            Durable Medical Equipment  (From admission, onward)         Start     Ordered   09/28/19 1139  For home use only DME Shower stool  Once        09/28/19 1138           Brief H and P: For complete details please refer to admission H and P, but in brief Katherine Rivers Gibsonis a 86 y.o.femalewith medical history significant foracute on chronic diastolic CHF, atrial flutter, hypertension, depression. Patient was sent from primary care provider's office with complaints of difficulty breathing on exertion, leg swellingover the past 2-1/2 weeks. Patient has chronic breathing problems.Patient also reports upper abdominal pain. She denies black stools, no vomiting of blood. No blood in stools. No NSAID use. Daughter reports patient's leg usually never swell,when she hasexcess fluid.Patient checks her weight daily, and weight has remained stable at about 123 pounds. Patient fell from her bed 1 month ago, for which was seen in the ED 7/8was placed onaneck and back brace,recommended by neurosurgery. She was not a candidate for surgery. CT spine showed acute  comminuted nondisplaced fracture of the left lateral mass of C1.Subsequent CTAneck showed grade 1 blunt cerebrovascular injury of the left vertebral artery at the C1 transverse foramen.  Patient reports neck and back brace sometimes limits her breathing, but she is able to lie almost flat.No chest pain. No cough. She takes 69mLasix as needed.She reports compliance with her Xarelto. Patient reports bilateral lower extremities have felt weak, she walks with a walker or a cane.She has no back pain or neck pain,despite fractureandcollar.  ED Course:Temperature 98.4, blood pressure 130s to 160s, heart rate 90s to low 100s, O2 sats greater than 97% on room air. Most of the blood work was done by primary care provider prior to arrival, BNP elevated at 1418 compared to prior-300s. Potassium 2.9. Hemoglobin 10.3. Troponin 12. Small left pleural effusion with left base atelectasis or infiltrate. 40 mg IV Lasix given, IV KCL 122m x 2.Hospitalist to admit for decompensated CHF and possible gi  bleed.  Hospital Course:   1. Acute respiratory failure with hypoxia. Secondary to decompensated CHF. Continue to wean off oxygen as tolerated.  Currently on room air at the time of discharge. 2. Acute on chronic diastolic CHF.  -Patient presented with shortness of breath, DOE, increased leg swelling, BNP elevated 1418, chest x-ray with pleural effusions.  Patient was given IV Lasix 40 mg x 1 in ED with improvement in her symptoms.  Patient has been compliant with Xarelto daily. -Patient was placed on IV Lasix 40 mg every 12 hours, 2D echo showed EF of 60 to 65% with grade 2 diastolic dysfunction -negative balance of 3.5 L at the time of discharge, weight down from 124lbs -> 116 at the time of discharge. -Transitioned to oral Lasix 40 mg daily with potassium supplementation outpatient.  Recommended follow-up with PCP, cardiology in 1 week, needs a renal panel and further adjustment of Lasix dose.   Patient's daughter was given instructions to check her weight, BP daily.  If weight and BP going down, can hold Lasix discussed with her cardiologist. 3. Paroxysmal atrial fibrillation with RVR.   -Patient was found to be in RVR and was continued on home dose of metoprolol and low-dose diltiazem was added.  Potentially, if her atrial fibrillation has been controlled this could precipitate her CHF exacerbation.  She is anticoagulated with Xarelto. -Clonidine was discontinued and short acting Cardizem was transitioned to long-acting 120 mg daily.  Outpatient follow-up and adjustment of doses by cardiology. 4. Hypokalemia.  Replaced and recommended daily supplementation with Lasix. 5. Chronic normocytic anemia, mild, hemoglobin approximately 11.  No signs of bleeding at this time.  FOBT negative.   6. Recent cervical fracture.  Continues to wear c-collar.  Continue to follow with neurosurgery.   Continue physical therapy, home health PT arranged by case management. 7. Hypertension.   -BP currently stable, continue metoprolol, losartan, Cardizem.  Clonidine discontinued  8. Recent vertebral artery dissection.  Continued on anticoagulation.  Follow-up with vascular surgery.  Day of Discharge S: Back to her baseline and did not get much rest overnight due to diuresis with Lasix.  Hoping to go home today.  Daughter at the bedside  BP 116/86 (BP Location: Right Arm)   Pulse 86   Temp 98.1 F (36.7 C) (Oral)   Resp 16   Ht 5' 2" (1.575 m)   Wt 52.7 kg   SpO2 97%   BMI 21.23 kg/m   Physical Exam: General: Alert and awake oriented x3 not in any acute distress. HEENT: anicteric sclera, pupils reactive to light and accommodation, c-collar on CVS: S1-S2 clear no murmur rubs or gallops Chest: clear to auscultation bilaterally, no wheezing rales or rhonchi Abdomen: soft nontender, nondistended, normal bowel sounds Extremities: no cyanosis, clubbing or edema noted bilaterally Neuro: Cranial nerves  II-XII intact, no focal neurological deficits    Get Medicines reviewed and adjusted: Please take all your medications with you for your next visit with your Primary MD  Please request your Primary MD to go over all hospital tests and procedure/radiological results at the follow up. Please ask your Primary MD to get all Hospital records sent to his/her office.  If you experience worsening of your admission symptoms, develop shortness of breath, life threatening emergency, suicidal or homicidal thoughts you must seek medical attention immediately by calling 911 or calling your MD immediately  if symptoms less severe.  You must read complete instructions/literature along with all the possible adverse reactions/side effects for all  the Medicines you take and that have been prescribed to you. Take any new Medicines after you have completely understood and accept all the possible adverse reactions/side effects.   Do not drive when taking pain medications.   Do not take more than prescribed Pain, Sleep and Anxiety Medications  Special Instructions: If you have smoked or chewed Tobacco  in the last 2 yrs please stop smoking, stop any regular Alcohol  and or any Recreational drug use.  Wear Seat belts while driving.  Please note  You were cared for by a hospitalist during your hospital stay. Once you are discharged, your primary care physician will handle any further medical issues. Please note that NO REFILLS for any discharge medications will be authorized once you are discharged, as it is imperative that you return to your primary care physician (or establish a relationship with a primary care physician if you do not have one) for your aftercare needs so that they can reassess your need for medications and monitor your lab values.   The results of significant diagnostics from this hospitalization (including imaging, microbiology, ancillary and laboratory) are listed below for reference.       Procedures/Studies:  DG Chest 2 View  Result Date: 09/27/2019 CLINICAL DATA:  86 year old female with shortness of breath EXAM: CHEST - 2 VIEW COMPARISON:  Chest radiograph dated 08/25/2019. FINDINGS: Small left pleural effusion with left lung base atelectasis or infiltrate. Calcified nodule in the right mid lung field as seen previously. There is no pneumothorax. There is mild cardiomegaly. Atherosclerotic calcification of the aorta. Degenerative changes of the spine. No acute osseous pathology. Upper lumbar compression fracture and vertebroplasty. IMPRESSION: Small left pleural effusion with left lung base atelectasis or infiltrate. Electronically Signed   By: Anner Crete M.D.   On: 09/27/2019 19:47   ECHOCARDIOGRAM COMPLETE  Result Date: 09/28/2019    ECHOCARDIOGRAM REPORT   Patient Name:   SHARDE GOVER Date of Exam: 09/28/2019 Medical Rec #:  [id number]      Height:       62.0 in Accession #:    [id number]     Weight:       124.0 lb Date of Birth:  12/22/1932     BSA:          1.560 m Patient Age:    4 years       BP:           126/77 mmHg Patient Gender: F              HR:           121 bpm. Exam Location:  [facility] Procedure: 2D Echo Indications:    CHF-Acute Diastolic 045.99 / H74.14  History:        Patient has prior history of Echocardiogram examinations, most                 recent 11/01/2017. CHF, Arrythmias:Atrial Fibrillation and Atrial                 Flutter; Risk Factors:Hypertension and Non-Smoker.  Sonographer:    Leavy Cella RDCS (AE) Referring Phys: [place]  1. Left ventricular ejection fraction, by estimation, is 60 to 65%. The left ventricle has normal function. The left ventricle has no regional wall motion abnormalities. There is moderate left ventricular hypertrophy. Left ventricular diastolic parameters are consistent with Grade II diastolic dysfunction (pseudonormalization). Elevated left atrial pressure.  2. Right ventricular  systolic function  is mildly reduced. The right ventricular size is moderately enlarged.  3. Left atrial size was severely dilated.  4. Right atrial size was severely dilated.  5. The mitral valve is normal in structure. No evidence of mitral valve regurgitation. No evidence of mitral stenosis.  6. The aortic valve is tricuspid. Aortic valve regurgitation is not visualized. No aortic stenosis is present.  7. Severe pulmonary HTN, PASP is 45 mmHg.  8. The inferior vena cava is dilated in size with <50% respiratory variability, suggesting right atrial pressure of 15 mmHg. FINDINGS  Left Ventricle: Left ventricular ejection fraction, by estimation, is 60 to 65%. The left ventricle has normal function. The left ventricle has no regional wall motion abnormalities. The left ventricular internal cavity size was normal in size. There is  moderate left ventricular hypertrophy. Left ventricular diastolic parameters are consistent with Grade II diastolic dysfunction (pseudonormalization). Elevated left atrial pressure. Right Ventricle: The right ventricular size is moderately enlarged. No increase in right ventricular wall thickness. Right ventricular systolic function is mildly reduced. The tricuspid regurgitant velocity is 2.92 m/s, and with an assumed right atrial pressure of 10 mmHg, the estimated right ventricular systolic pressure is 70.3 mmHg. Left Atrium: Left atrial size was severely dilated. Right Atrium: Right atrial size was severely dilated. Pericardium: There is no evidence of pericardial effusion. Mitral Valve: The mitral valve is normal in structure. There is mild thickening of the mitral valve leaflet(s). There is mild calcification of the mitral valve leaflet(s). Mild mitral annular calcification. No evidence of mitral valve regurgitation. No evidence of mitral valve stenosis. Tricuspid Valve: The tricuspid valve is normal in structure. Tricuspid valve regurgitation is mild . No evidence of tricuspid stenosis.  Aortic Valve: The aortic valve is tricuspid. . There is mild thickening and mild calcification of the aortic valve. Aortic valve regurgitation is not visualized. No aortic stenosis is present. Mild aortic valve annular calcification. There is mild thickening of the aortic valve. There is mild calcification of the aortic valve. Aortic valve mean gradient measures 5.1 mmHg. Aortic valve peak gradient measures 9.2 mmHg. Aortic valve area, by VTI measures 1.88 cm. Pulmonic Valve: The pulmonic valve was not well visualized. Pulmonic valve regurgitation is mild. No evidence of pulmonic stenosis. Aorta: The aortic root is normal in size and structure. Pulmonary Artery: Severe pulmonary HTN, PASP is 45 mmHg. Venous: The inferior vena cava is dilated in size with less than 50% respiratory variability, suggesting right atrial pressure of 15 mmHg. IAS/Shunts: The interatrial septum was not well visualized.  LEFT VENTRICLE PLAX 2D LVIDd:         3.05 cm  Diastology LVIDs:         2.04 cm  LV e' lateral:   7.72 cm/s LV PW:         1.20 cm  LV E/e' lateral: 14.1 LV IVS:        1.20 cm  LV e' medial:    5.98 cm/s LVOT diam:     1.80 cm  LV E/e' medial:  18.2 LV SV:         46 LV SV Index:   29 LVOT Area:     2.54 cm  RIGHT VENTRICLE RV S prime:     7.94 cm/s TAPSE (M-mode): 1.4 cm LEFT ATRIUM           Index       RIGHT ATRIUM           Index LA diam:  2.80 cm 1.79 cm/m  RA Area:     17.20 cm LA Vol (A2C): 29.7 ml 19.04 ml/m RA Volume:   39.40 ml  25.25 ml/m LA Vol (A4C): 65.0 ml 41.66 ml/m  AORTIC VALVE AV Area (Vmax):    1.53 cm AV Area (Vmean):   1.51 cm AV Area (VTI):     1.88 cm AV Vmax:           152.03 cm/s AV Vmean:          111.343 cm/s AV VTI:            0.243 m AV Peak Grad:      9.2 mmHg AV Mean Grad:      5.1 mmHg LVOT Vmax:         91.69 cm/s LVOT Vmean:        66.159 cm/s LVOT VTI:          0.179 m LVOT/AV VTI ratio: 0.74  AORTA Ao Root diam: 2.70 cm MITRAL VALVE                TRICUSPID VALVE MV Area  (PHT): 4.80 cm     TR Peak grad:   34.1 mmHg MV Decel Time: 158 msec     TR Vmax:        292.00 cm/s MV E velocity: 109.00 cm/s MV A velocity: 53.60 cm/s   SHUNTS MV E/A ratio:  2.03         Systemic VTI:  0.18 m                             Systemic Diam: 1.80 cm Carlyle Dolly MD Electronically signed by Carlyle Dolly MD Signature Date/Time: 09/28/2019/11:35:38 AM    Final        LAB RESULTS: Basic Metabolic Panel: Recent Labs  Lab 09/27/19 1535 09/27/19 1950 09/28/19 0336 09/29/19 0504  NA   < >  --  135 135  K   < >  --  4.2 3.3*  CL   < >  --  99 94*  CO2   < >  --  25 29  GLUCOSE   < >  --  95 101*  BUN   < >  --  14 18  CREATININE   < >  --  0.82 0.87  CALCIUM   < >  --  8.5* 8.8*  MG  --  2.0  --   --    < > = values in this interval not displayed.   Liver Function Tests: Recent Labs  Lab 09/27/19 1535  AST 22  ALT 15  ALKPHOS 83  BILITOT 1.3*  PROT 6.2*  ALBUMIN 2.9*   Recent Labs  Lab 09/27/19 1535  LIPASE 28   No results for input(s): AMMONIA in the last 168 hours. CBC: Recent Labs  Lab 09/27/19 1535 09/27/19 1535 09/28/19 0336 09/28/19 0336 09/29/19 0504  WBC 6.1   < > 7.2  --  6.6  NEUTROABS 4.2  --   --   --   --   HGB 10.3*   < > 11.1*  --  11.1*  HCT 32.9*   < > 35.1*  --  34.9*  MCV 89.4   < > 90.0   < > 91.1  PLT 396   < > 389  --  446*   < > = values in this interval not displayed.   Cardiac  Enzymes: No results for input(s): CKTOTAL, CKMB, CKMBINDEX, TROPONINI in the last 168 hours. BNP: Invalid input(s): POCBNP CBG: No results for input(s): GLUCAP in the last 168 hours.     Disposition and Follow-up: Discharge Instructions    (HEART FAILURE PATIENTS) Call MD:  Anytime you have any of the following symptoms: 1) 3 pound weight gain in 24 hours or 5 pounds in 1 week 2) shortness of breath, with or without a dry hacking cough 3) swelling in the hands, feet or stomach 4) if you have to sleep on extra pillows at night in order to  breathe.   Complete by: As directed    Diet - low sodium heart healthy   Complete by: As directed    Increase activity slowly   Complete by: As directed        DISPOSITION: Home with home health   [place]    Verta Ellen., NP On 10/10/2019.   Specialty: Cardiology Why:  at 1:30 pm Contact information: [place] [address] [phone]                Time coordinating discharge:  35 minutes  Signed:   Estill Cotta M.D. [facility] 09/29/2019, 12:57 PM

## 2019-09-29 NOTE — Discharge Instructions (Signed)

## 2019-09-29 NOTE — Evaluation (Signed)
Physical Therapy Evaluation Patient Details Name: Katherine Lane MRN: [id number] DOB: 12-Aug-1932 Today's Date: 09/29/2019   History of Present Illness  Katherine Lane is a 86 y.o. female with medical history significant for acute on chronic diastolic CHF, atrial flutter, hypertension, depression.Patient was sent from primary care provider's office with complaints of difficulty breathing on exertion, leg swelling over the past 2-1/2 weeks.  Patient has chronic breathing problems.  Patient also reports upper abdominal pain.  She denies black stools, no vomiting of blood.  No blood in stools.  No NSAID use.  Daughter reports patient's leg usually never swell, when she has excess fluid.  Patient checks her weight daily, and weight has remained stable at about 123 pounds.  Clinical Impression  Patient able to perform all bed mobility, transfers and walking with supervision and without assistive device. Patient knows importance of continued use of wearing neck brace at all times. Patient near baseline and has cane at home which she uses intermittently as needed. Daughter is able to help and stay with patient as needed to assist with daily tasks. Patient discharged from physical therapy to nursing staff for ambulation as tolerated daily for length of stay, recommend venue below for continued strengthening to reduce risk of falls.      Follow Up Recommendations Home health PT;Supervision for mobility/OOB    Equipment Recommendations       Recommendations for Other Services       Precautions / Restrictions Precautions Precautions: Fall Restrictions Weight Bearing Restrictions: No      Mobility  Bed Mobility Overal bed mobility: Independent                Transfers Overall transfer level: Independent                  Ambulation/Gait Ambulation/Gait assistance: Supervision Gait Distance (Feet): 100 Feet Assistive device: None Gait Pattern/deviations: Decreased step length -  right;Decreased step length - left Gait velocity: decreased   General Gait Details: slow, decreased stride length, weakness noted with ambulation  Stairs            Wheelchair Mobility    Modified Rankin (Stroke Patients Only)       Balance Overall balance assessment: Modified Independent                                           Pertinent Vitals/Pain Pain Assessment: No/denies pain    Home Living Family/patient expects to be discharged to:: Private residence Living Arrangements: Alone Available Help at Discharge: Family Type of Home: House Home Access: Stairs to enter Entrance Stairs-Rails: Lawyer of Steps: 2 Home Layout: One level Home Equipment: Environmental consultant - 2 wheels;Cane - single point      Prior Function Level of Independence: Independent with assistive device(s)         Comments: uses SPC as needed. Daughter helps with cooking since neck fracture, has assistance with cleaning     Hand Dominance   Dominant Hand: Right    Extremity/Trunk Assessment        Lower Extremity Assessment Lower Extremity Assessment: Generalized weakness    Cervical / Trunk Assessment Cervical / Trunk Assessment:  (neck brace immobilizer to be worn at all times.)  Communication      Cognition Arousal/Alertness: Awake/alert   Overall Cognitive Status: Within Functional Limits for tasks assessed  General Comments      Exercises General Exercises - Lower Extremity Ankle Circles/Pumps: AROM;Seated;10 reps;Both Hip Flexion/Marching: AROM;Strengthening;Seated;10 reps;Both   Assessment/Plan    PT Assessment Patent does not need any further PT services  PT Problem List         PT Treatment Interventions      PT Goals (Current goals can be found in the Care Plan section)  Acute Rehab PT Goals Patient Stated Goal: to go home PT Goal Formulation: With patient Time  For Goal Achievement: 10/13/19 Potential to Achieve Goals: Good    Frequency     Barriers to discharge        Co-evaluation               AM-PAC PT "6 Clicks" Mobility  Outcome Measure Help needed turning from your back to your side while in a flat bed without using bedrails?: None Help needed moving from lying on your back to sitting on the side of a flat bed without using bedrails?: None Help needed moving to and from a bed to a chair (including a wheelchair)?: None Help needed standing up from a chair using your arms (e.g., wheelchair or bedside chair)?: None Help needed to walk in hospital room?: A Little Help needed climbing 3-5 steps with a railing? : A Little 6 Click Score: 22    End of Session Equipment Utilized During Treatment: Gait belt Activity Tolerance: Patient tolerated treatment well;No increased pain;Patient limited by fatigue Patient left: in bed;with call bell/phone within reach;with family/visitor present Nurse Communication: Mobility status      Time: 3976-7341 PT Time Calculation (min) (ACUTE ONLY): 23 min   Charges:   PT Evaluation $PT Eval Low Complexity: 1 Low PT Treatments $Therapeutic Exercise: 8-22 mins       9:38 AM, 09/29/19 Tereasa Coop, DPT Physical Therapy with [facility]  [phone] office

## 2019-09-29 NOTE — TOC Transition Note (Signed)
Transition of Care [facility]) - CM/SW Discharge Note  Patient Details  Name: Katherine Lane MRN: [id number] Date of Birth: 1932/03/25  [facility] CM/SW Contact:  Ewing Schlein, LCSW Phone Number: 09/29/2019, 9:46 AM  Clinical Narrative: CSW updated by hospitalist the patient is expected to discharge today. CSW notified Tim with [facility] of pending discharge. HH orders have already been placed. TOC signing off.  Final next level of care: Home w Home Health Services Barriers to Discharge: Barriers Resolved  Patient Goals and CMS Choice Patient states their goals for this hospitalization and ongoing recovery are:: Discharge home CMS Medicare.gov Compare Post Acute Care list provided to:: Patient Choice offered to / list presented to : Patient  Discharge Plan and Services In-house Referral: Clinical Social Work Discharge Planning Services: NA Post Acute Care Choice: Durable Medical Equipment, Home Health          DME Arranged: Shower stool DME Agency: [employer] Date DME Agency Contacted: 09/28/19 Representative spoke with at DME Agency: Efraim Kaufmann HH Arranged: RN, PT, OT HH Agency: [facility] ([facility] Date HH Agency Contacted: 09/29/19 Time HH Agency Contacted: [phone] Representative spoke with at [facility] Agency: Tim  Readmission Risk Interventions No flowsheet data found.

## 2019-10-03 ENCOUNTER — Encounter: Payer: Self-pay | Admitting: Family Medicine

## 2019-10-03 ENCOUNTER — Ambulatory Visit: Payer: Medicare PPO | Admitting: Family Medicine

## 2019-10-03 VITALS — BP 118/70 | HR 90 | Ht 62.0 in | Wt 115.4 lb

## 2019-10-03 DIAGNOSIS — I10 Essential (primary) hypertension: Secondary | ICD-10-CM

## 2019-10-03 DIAGNOSIS — I4819 Other persistent atrial fibrillation: Secondary | ICD-10-CM | POA: Diagnosis not present

## 2019-10-03 DIAGNOSIS — I5032 Chronic diastolic (congestive) heart failure: Secondary | ICD-10-CM

## 2019-10-03 MED ORDER — FUROSEMIDE 40 MG PO TABS: 40.0000 mg | ORAL_TABLET | ORAL | Status: DC

## 2019-10-03 MED ORDER — POTASSIUM CHLORIDE CRYS ER 10 MEQ PO TBCR: 10.0000 meq | EXTENDED_RELEASE_TABLET | ORAL | Status: DC

## 2019-10-03 NOTE — Progress Notes (Signed)
Cardiology Office Note  Date: 10/03/2019   ID: Katherine Lane, DOB 10-21-1932, MRN [id number]  PCP:  Kathyrn Drown, MD  Cardiologist:  Kate Sable, MD (Inactive) Electrophysiologist:  None   Chief Complaint: Persistent atrial fibrillation  History of Present Illness: Katherine Lane is a 86 y.o. female with a history of persistent atrial fibrillation, hypertension, chronic diastolic heart failure.  Last seen by Dr. Bronson Ing via telemedicine March 28, 2019.  Patient had been experiencing neuropathic headaches and persistent effusion in her right ear.  Blood pressure had been elevated.  She was taking losartan at 7 PM and hydralazine at 10:30 PM when she went to bed.  There was discussion about additional antihypertensive strategies at length.  Persistent atrial fibrillation was controlled on Toprol-XL 100 mg daily.  She continued on Xarelto for anticoagulation.  She was educated on close monitoring of sodium and fluid restrictions.  Blood pressure was elevated.  Clonidine 0.1 mg at 8 PM was added.  Recent hospital admission 09/27/2019 for acute respiratory failure with hypoxia, hypokalemia, essential hypertension, PAF, acute on chronic diastolic heart failure, chronic normocytic anemia, recent cervical fracture with c-collar, recent vertebral artery dissection.  She was sent from PCP office for dyspnea on exertion, leg swelling over the past 1-2 weeks.  She has chronic breathing problems.  Reported upper abdominal pain.  She had fallen from her bed and seen in the emergency department on 08/25/2019 and was placed on a neck and back brace.  She is not a candidate for surgery.  C-spine showed acute comminuted nondisplaced fracture of the left lateral mass of C1.  She was started on Lasix IV 40 mg every 12.  Echo showed EF of 66 5% with grade 2 diastolic dysfunction.  She was -3.5 L at time of discharge.  Weight was down from 124pounds to 116 pounds at discharge.  Recommended a renal panel  in 1 week and further adjustment of Lasix dose.  She was also noted to be in paroxysmal atrial fibrillation with RVR.  She was continued on her home dose of metoprolol and diltiazem was added.  Clonidine was discontinued and short acting Cardizem was transitioned to long-acting and 120 mg/day.  Blood pressure was stable on metoprolol, losartan, Cardizem.  She had a recent vertebral artery dissection.  She was continuing on anticoagulation and follow-up of vascular surgery.   She is here for post hospital follow-up today.  She states her legs are weak and she is wearing a neck brace for recent fracture to C1.  She is scheduled to follow-up with neuro in the near future.  Daughter who is with her states she weighed 124 pounds on August 10 when she was admitted and 116 pounds at discharge.  She states today at home she weighed 112 pounds on her scales.  On clinic scales scales today she weighs 115 pounds.  She states her shortness of breath is better.  The lower extremity swelling is better and feeling of fullness in her chest is better.  She states her legs feel weak and daughter is asking how long it might take to feel a little stronger in her legs.  She has no evidence of edema in her legs.  Her atrial fibrillation rate is controlled at 90 bpm.  Past Medical History:  Diagnosis Date  . A-fib ([place])   . Anxiety   . Arthritis   . CHF (congestive heart failure) ([place])   . Chronic back pain   . Constipation   .  Constipation, chronic   . Depression   . Family history of adverse reaction to anesthesia    Mother, Sister, daughter- N/V  . History of blood transfusion    "pregnancy"  . HTN (hypertension)   . Insomnia   . Migraine headache   . Osteoporosis   . PONV (postoperative nausea and vomiting)    "violent"  . Pulmonary edema 06/2016  . Sodium (Na) deficiency   . Trigeminal neuralgia     Past Surgical History:  Procedure Laterality Date  . ANKLE FRACTURE SURGERY Right   . CARDIAC  CATHETERIZATION     denies  . CATARACT EXTRACTION W/PHACO Right 07/22/2012   Procedure: CATARACT EXTRACTION PHACO AND INTRAOCULAR LENS PLACEMENT (IOC);  Surgeon: Tonny Branch, MD;  Location: [facility];  Service: Ophthalmology;  Laterality: Right;  CDE:  18.93  . CATARACT EXTRACTION W/PHACO Left 08/09/2012   Procedure: CATARACT EXTRACTION PHACO AND INTRAOCULAR LENS PLACEMENT (IOC);  Surgeon: Tonny Branch, MD;  Location: [facility];  Service: Ophthalmology;  Laterality: Left;  CDE: 17.21  . COLONOSCOPY    . FLEXIBLE SIGMOIDOSCOPY N/A 11/19/2018   Procedure: FLEXIBLE SIGMOIDOSCOPY;  Surgeon: Danie Binder, MD;  Location: [facility];  Service: Endoscopy;  Laterality: N/A;  7:30am  . HARDWARE REMOVAL  07/18/2011   Procedure: HARDWARE REMOVAL; Right leg-  Surgeon: Carole Civil, MD;  Location: [facility];  Service: Orthopedics;  Laterality: Right;  . HEMORRHOID BANDING N/A 11/19/2018   Procedure: HEMORRHOID BANDING;  Surgeon: Danie Binder, MD;  [facility];  Service: Endoscopy;  Laterality: N/A;  . KYPHOPLASTY N/A 12/26/2016   Procedure: Lumbar two Lumbar three and Lumbar five KYPHOPLASTY;  Surgeon: Erline Levine, MD;  Location: [place] [place];  Service: Neurosurgery;  Laterality: N/A;  . KYPHOPLASTY N/A 02/19/2017   Procedure: KYPHOPLASTY LUMBAR ONE;  Surgeon: Erline Levine, MD;  Location: [place];  Service: Neurosurgery;  Laterality: N/A;  KYPHOPLASTY LUMBAR ONE  . NERVE REPAIR  07/18/2011   Procedure: NERVE REPAIR;  Surgeon: Carole Civil, MD;  Location: [facility];  Service: Orthopedics;  Laterality: Right;  Right leg superficial peroneal nerve release    . PARTIAL HYSTERECTOMY      Current Outpatient Medications  Medication Sig Dispense Refill  . diltiazem (CARDIZEM CD) 120 MG 24 hr capsule Take 1 capsule (120 mg total) by mouth daily. 30 capsule 2  . furosemide (LASIX) 40 MG tablet Take 1 tablet (40 mg total) by mouth daily. 30 tablet 3  . hydrALAZINE (APRESOLINE) 50 MG tablet Take 1 tablet (50 mg  total) by mouth 3 (three) times daily. 120 tablet 6  . hydrocortisone (ANUSOL-HC) 2.5 % rectal cream APPLY RECTALLY THREE TIMES DAILY AS DIRECTED (Patient taking differently: Place 1 application rectally 2 (two) times daily as needed for hemorrhoids or anal itching. ) 30 g 5  . Lidocaine-Hydrocortisone Ace 3-2.5 % KIT APPLY TO RECTUM QID AS NEEDED FOR RECTAL PAIN OR BLEEDING (Patient taking differently: Place 1 application rectally daily as needed (rectal pain or bleeding). ) 1 kit 1  . LORazepam (ATIVAN) 0.5 MG tablet TAKE ONE TABLET BY MOUTH UP TO TWICE DAILY AS NEEDED. (Patient taking differently: Take 0.5 mg by mouth 2 (two) times daily as needed for anxiety or sleep. TAKE ONE TABLET BY MOUTH UP TO TWICE DAILY AS NEEDED.) 60 tablet 5  . losartan (COZAAR) 50 MG tablet Take 1 tablet (50 mg total) by mouth 2 (two) times daily. Daily at 7 AM and at 7 PM 60 tablet  6  . Magnesium 250 MG TABS Take 250 mg by mouth at bedtime.     . metoprolol succinate (TOPROL-XL) 100 MG 24 hr tablet Take 1 tablet (100 mg total) by mouth daily. 30 tablet 11  . Multiple Vitamin (MULTIVITAMIN WITH MINERALS) TABS tablet Take 1 tablet by mouth daily. Centrum Silver    . pantoprazole (PROTONIX) 40 MG tablet Take 40 mg by mouth as needed.    Vladimir Faster Glycol-Propyl Glycol (LUBRICANT EYE DROPS) 0.4-0.3 % SOLN Place 1 drop into both eyes at bedtime.    . polyethylene glycol powder (MIRALAX) powder Take 17 g daily as needed by mouth for moderate constipation.     . potassium chloride (KLOR-CON) 10 MEQ tablet Take 1 tablet (10 mEq total) by mouth daily. 45 tablet 3  . shark liver oil-cocoa butter (PREPARATION H) 0.25-3-85.5 % suppository Place 1 suppository rectally 2 (two) times daily as needed for hemorrhoids.     Alveda Reasons 20 MG TABS tablet TAKE 1 TABLET DAILY WITH SUPPER. (Patient taking differently: Take 20 mg by mouth daily with supper. ) 90 tablet 1  . zolpidem (AMBIEN) 10 MG tablet Take 10 mg by mouth at bedtime as needed  for sleep.     No current facility-administered medications for this visit.   Allergies:  Amlodipine, Codeine, Other, and Tramadol   Social History: The patient  reports that she has never smoked. She has never used smokeless tobacco. She reports that she does not drink alcohol and does not use drugs.   Family History: The patient's family history includes Alzheimer's disease in her mother; Cancer in her mother; Diabetes in her brother and daughter; Hypertension in her father and mother; Other in her brother and father.   ROS:  Please see the history of present illness. Otherwise, complete review of systems is positive for none.  All other systems are reviewed and negative.   Physical Exam: VS:  BP 118/70   Pulse 90   Ht _0  (1.575 m)   Wt 115 lb 6.4 oz (52.3 kg)   SpO2 98%   BMI 21.11 kg/m , BMI Body mass index is 21.11 kg/m.  Wt Readings from Last 3 Encounters:  10/03/19 115 lb 6.4 oz (52.3 kg)  09/29/19 116 lb 1.6 oz (52.7 kg)  09/26/19 124 lb (56.2 kg)    General: Patient appears comfortable at rest.  She is wearing a neck brace for recent C1 fracture. Neck: Supple, no elevated JVP or carotid bruits, no thyromegaly. Lungs: Clear to auscultation, nonlabored breathing at rest. Cardiac: Irregularly irregular rate and rhythm, no S3 or significant systolic murmur, no pericardial rub. Extremities: No pitting edema, distal pulses 2+. Skin: Warm and dry. Musculoskeletal: No kyphosis. Neuropsychiatric: Alert and oriented x3, affect grossly appropriate.  ECG:  EKG on 09/27/2019 showed atrial fibrillation with a rate of 104.  Anterior infarct, old  Recent Labwork: 09/27/2019: ALT 15; AST 22; B Natriuretic Peptide 1,418.0; Magnesium 2.0 09/29/2019: BUN 18; Creatinine, Ser 0.87; Hemoglobin 11.1; Platelets 446; Potassium 3.3; Sodium 135     Component Value Date/Time   CHOL 177 09/24/2015 0837   TRIG 71 11/29/2018 0438   HDL 57 09/24/2015 0837   CHOLHDL 3.1 09/24/2015 0837    CHOLHDL 3.1 11/07/2013 0807   VLDL 26 11/07/2013 0807   LDLCALC 93 09/24/2015 0837    Other Studies Reviewed Today: Echocardiogram: 10/2017 Study Conclusions  - Left ventricle: The cavity size was normal. Wall thickness was increased in a pattern of mild LVH.  Systolic function was vigorous. The estimated ejection fraction was in the range of 65% to 70%. Wall motion was normal; there were no regional wall motion abnormalities. The study was not technically sufficient to allow evaluation of LV diastolic dysfunction due to atrial fibrillation. Doppler parameters are consistent with high ventricular filling pressure. - Aortic valve: Trileaflet; mildly thickened leaflets. There was mild regurgitation. - Aorta: Mild ascending aortic dilatation. Diameter 4.03 cm. - Mitral valve: There was mild regurgitation. - Atrial septum: No defect or patent foramen ovale was identified. - Tricuspid valve: There was mild regurgitation. - Pulmonary arteries: Systolic pressure was mildly to moderately increased. PA peak pressure: 41 mm Hg (S).  Assessment and Plan:  1. Persistent atrial fibrillation ([place])   2. Chronic diastolic heart failure ([place])   3. Essential hypertension    1. Persistent atrial fibrillation ([facility]) Heart rate is irregularly irregular with a rate of 90 today.  Continue Cardizem CD 120 mg daily.  Continue Xarelto 20 mg daily.  2. Chronic diastolic heart failure ([place]) Patient states breathing is better.  Her lower extremity edema is better.  The fullness in her chest when presenting the hospital is better.  She has lost weight since discharge.  Her discharge weight was 116 pounds.  She states her scales at home show she weighed 112 pounds today.  The scales in clinic today show she weighs 115 pounds.  Change Lasix 40 mg from daily to every other day as well as potassium.  If on days she is not scheduled to take Lasix she weighs an extra 3 pounds over the 24-hour., she  may take an extra Lasix.  Get a BMP today and in 2 weeks.  3. Essential hypertension Blood pressure well controlled on current therapy.  Continue losartan 50 mg p.o. twice daily  Medication Adjustments/Labs and Tests Ordered: Current medicines are reviewed at length with the patient today.  Concerns regarding medicines are outlined above.   Disposition: Follow-up with Dr. Domenic Polite or APP in 2 months  Signed, Levell July, NP 10/03/2019 2:11 PM    [facility], [place], [place] [address] Phone: [phone]; Fax: [phone]

## 2019-10-03 NOTE — Patient Instructions (Addendum)
Medication Instructions:   Change your Lasix to every other day.  On the off days, may take as needed based on weight gain of 3 lbs in 24 hours or 5 lbs in one week.  Change your Potassium to every other day as well.   Continue all other medications.    Labwork:  BMET - order given today.   Office will contact with results via phone or letter.     Testing/Procedures: none  Follow-Up: 2 months   Any Other Special Instructions Will Be Listed Below (If Applicable).  If you need a refill on your cardiac medications before your next appointment, please call your pharmacy.

## 2019-10-03 NOTE — Addendum Note (Signed)
Addended by: Lesle Chris on: 10/03/2019 02:28 PM   Modules accepted: Orders

## 2019-10-04 ENCOUNTER — Ambulatory Visit: Payer: Medicare PPO | Admitting: Family Medicine

## 2019-10-04 ENCOUNTER — Encounter: Payer: Self-pay | Admitting: Family Medicine

## 2019-10-04 ENCOUNTER — Other Ambulatory Visit: Payer: Self-pay

## 2019-10-04 VITALS — BP 130/68 | Temp 96.8°F | Wt 114.4 lb

## 2019-10-04 DIAGNOSIS — I10 Essential (primary) hypertension: Secondary | ICD-10-CM

## 2019-10-04 DIAGNOSIS — I480 Paroxysmal atrial fibrillation: Secondary | ICD-10-CM | POA: Diagnosis not present

## 2019-10-04 NOTE — Progress Notes (Signed)
   Subjective:    Patient ID: Katherine Lane, female    DOB: 1932-09-15, 86 y.o.   MRN: [id number]  HPI Pt here for follow up. Pt was sent to ER by provider last Tuesday. Pt states she feels very well today. No issues or concerns at this time. Pt had 8 lbs of fluid drawn off.  Patient recently in the hospital for CHF.  Also has been dealing with significant pain and discomfort associated with the neck and back from recent fall She is seeing cardiology.  Under their care Recently was told to reduce the Lasix to every other day They also reduce the hydralazine to 3 times a day Also has atrial for been taking diltiazem   Review of Systems  Constitutional: Negative for activity change, appetite change and fatigue.  HENT: Negative for congestion and rhinorrhea.   Eyes: Negative for discharge.  Respiratory: Negative for cough, chest tightness and wheezing.   Cardiovascular: Negative for chest pain.  Gastrointestinal: Negative for abdominal pain, blood in stool and vomiting.  Endocrine: Negative for polyphagia.  Genitourinary: Negative for difficulty urinating and frequency.  Musculoskeletal: Negative for neck pain.  Skin: Negative for color change.  Allergic/Immunologic: Negative for environmental allergies and food allergies.  Neurological: Negative for weakness and headaches.  Psychiatric/Behavioral: Negative for agitation and behavioral problems.       Objective:   Physical Exam Constitutional:      Appearance: She is well-developed.  HENT:     Head: Normocephalic.     Right Ear: External ear normal.     Left Ear: External ear normal.  Eyes:     Pupils: Pupils are equal, round, and reactive to light.  Neck:     Thyroid: No thyromegaly.  Cardiovascular:     Rate and Rhythm: Normal rate. Rhythm irregular.     Heart sounds: Normal heart sounds. No murmur heard.   Pulmonary:     Effort: Pulmonary effort is normal. No respiratory distress.     Breath sounds: Normal breath  sounds. No wheezing.  Abdominal:     General: Bowel sounds are normal. There is no distension.     Palpations: Abdomen is soft. There is no mass.     Tenderness: There is no abdominal tenderness.  Musculoskeletal:        General: No tenderness. Normal range of motion.     Cervical back: Normal range of motion.  Lymphadenopathy:     Cervical: No cervical adenopathy.  Skin:    General: Skin is warm and dry.  Neurological:     Mental Status: She is alert and oriented to person, place, and time.     Motor: No abnormal muscle tone.  Psychiatric:        Behavior: Behavior normal.           Assessment & Plan:  Atrial fibrillation rate controlled continue current measures Blood pressure good control continue current measures Recent CHF no sign of excessive fluid weights look good awaiting metabolic 7 which was drawn yesterday Cervical fracture sees specialist in September hopefully gets brace off We did discuss healthy eating monitoring weight monitoring blood pressure continuing medications Patient encouraged to follow-up in approximately 2 to 3 months

## 2019-10-05 ENCOUNTER — Telehealth: Payer: Self-pay | Admitting: Family Medicine

## 2019-10-05 NOTE — Telephone Encounter (Signed)
Noted thank you

## 2019-10-05 NOTE — Telephone Encounter (Signed)
Nurses if the patient needs approval for occupational therapy may have this

## 2019-10-05 NOTE — Telephone Encounter (Signed)
Left message to return call with [facility]

## 2019-10-05 NOTE — Telephone Encounter (Signed)
Katherine Lane returned call. Katherine Lane states that patient does not need any more OT.

## 2019-10-05 NOTE — Telephone Encounter (Signed)
Katherine Lane with [facility] calling to let Dr. Lorin Picket know the home health OT was evaluation only.

## 2019-10-06 ENCOUNTER — Telehealth: Payer: Self-pay | Admitting: Family Medicine

## 2019-10-06 NOTE — Telephone Encounter (Signed)
May give verbal

## 2019-10-06 NOTE — Telephone Encounter (Signed)
Verbal orders given to april 

## 2019-10-06 NOTE — Telephone Encounter (Signed)
April with [facility] fax over orders for 2x week for 2 and 1 week 2 and 1 prn. Faxed on 16th  April call [phone]

## 2019-10-07 ENCOUNTER — Telehealth: Payer: Self-pay | Admitting: Family Medicine

## 2019-10-07 ENCOUNTER — Telehealth: Payer: Self-pay | Admitting: Internal Medicine

## 2019-10-07 NOTE — Telephone Encounter (Signed)
Tried to call daughter back.  Will route info to RX pool to see if we can take care of RX for pt.

## 2019-10-07 NOTE — Telephone Encounter (Signed)
Called [facility]. They have Rx on file for the compounded hemorrhoid cream. Cave a verbal to refill medication. Pharmacist will work on getting [facility] Hemorrhoid Cream filled today.

## 2019-10-07 NOTE — Telephone Encounter (Signed)
Advised daughter [name], that we did not have results yet but they would be requested and she would be contacted once there are resulted on. Verbalized understanding .

## 2019-10-07 NOTE — Telephone Encounter (Signed)
Home health was ordered through [facility] due to [facility] couldn't take pt either due to insurance or staffing

## 2019-10-07 NOTE — Telephone Encounter (Signed)
Pt's daughter, [name], called to say that patient needed a refill on her hemorrhoid cream and would like to get it today. She said normally she uses [facility] , but this was a compound and needed to go to [facility]. She said the pharmacy had faxed [place] but they haven't heard back from [place]. Please advise. [name] can be reached at [phone]

## 2019-10-07 NOTE — Telephone Encounter (Signed)
Called pt and informed her that RX refill had been addressed and should be available some time today at [facility].  She voiced understanding.

## 2019-10-07 NOTE — Telephone Encounter (Signed)
New message     patient daughter [name] is calling to get her mothers lab results, [facility] said they faxed them already ?

## 2019-10-10 ENCOUNTER — Telehealth: Payer: Self-pay | Admitting: Family Medicine

## 2019-10-10 ENCOUNTER — Other Ambulatory Visit (HOSPITAL_COMMUNITY): Payer: Medicare PPO

## 2019-10-10 ENCOUNTER — Ambulatory Visit: Payer: Medicare PPO | Admitting: Family Medicine

## 2019-10-10 NOTE — Telephone Encounter (Signed)
Lmtc

## 2019-10-10 NOTE — Telephone Encounter (Signed)
[name] called requesting to see if we had recent test results on patient.   [phone]

## 2019-10-10 NOTE — Telephone Encounter (Signed)
-----   Message from Eustace Moore, RN sent at 10/10/2019  2:48 PM EDT -----  ----- Message ----- From: Jonelle Sidle, MD Sent: 10/09/2019   4:48 PM EDT To: Eustace Moore, RN, Netta Neat., NP  Results reviewed.  This lab work was ordered by Mr. Vincenza Hews NP after recent encounter with patient, formerly followed by Dr. Purvis Sheffield.  I was not familiar with her recent assessment and reviewed the notes in the chart.  Creatinine has bumped up slightly to 1.1 with normal potassium of 3.8. Lasix already reduced by Mr. Vincenza Hews NP at office visit.  Would keep current follow-up plan.

## 2019-10-10 NOTE — Telephone Encounter (Signed)
Patient's daughter informed and verbalized understanding of plan. 

## 2019-10-10 NOTE — Telephone Encounter (Signed)
Joey notified.

## 2019-10-10 NOTE — Telephone Encounter (Signed)
Joey from [facility] call and for Vergene Marland needs verbal 1 x for 4 weeks  Joey call back [phone]

## 2019-10-10 NOTE — Telephone Encounter (Signed)
Go ahead with this thank you

## 2019-10-10 NOTE — Telephone Encounter (Signed)
Please advise. Thank you

## 2019-10-19 ENCOUNTER — Encounter: Payer: Self-pay | Admitting: Internal Medicine

## 2019-10-20 ENCOUNTER — Other Ambulatory Visit: Payer: Self-pay | Admitting: Family Medicine

## 2019-10-20 ENCOUNTER — Telehealth: Payer: Self-pay | Admitting: Family Medicine

## 2019-10-20 MED ORDER — ZOLPIDEM TARTRATE 5 MG PO TABS: ORAL_TABLET | ORAL | 1 refills | Status: DC

## 2019-10-20 NOTE — Telephone Encounter (Signed)
Pt returned call. Pt is currently taking 5 mg Ambien. Ambien 10 mg was on historical list but Ambien 5 mg is not on current or historical med list. Pt states the pharmacist states its due on the 18th. Pt states she really needs it since she has the neck brace on all. Will mail hand out to patient. Please advise. Thank you

## 2019-10-20 NOTE — Telephone Encounter (Signed)
Although I am sympathetic to the patient we cannot use sleeping pills Current approach toward sleep issues is behavioral processes Medications are no longer considered to be suitable or safe And with her history of falling out of bed and fracturing her neck we cannot utilize sleep medications (There is too greater risk for accident or injury at nighttime) May send sleep hygiene print out that is on hand at the nurses station Some patients will also utilize OTC measures for sleep but studies have not really shown those to be beneficial

## 2019-10-20 NOTE — Telephone Encounter (Signed)
Pt was on Ambien 10 mg in July showing in Medical history and Dr Lorin Picket took her down to 5 mg. Pt is starting that since her broken neck she is not sleeping at night. In the medication chart both have been marked out. Pt is wanting this sent to [facility] - [place], [place]   Pt call back [phone]

## 2019-10-20 NOTE — Telephone Encounter (Signed)
Pt contacted and verbalized understanding.  

## 2019-10-20 NOTE — Telephone Encounter (Signed)
Attempted to contact patient. No answering service set up at this time.

## 2019-10-20 NOTE — Telephone Encounter (Signed)
A refill was sent into [facility] pharmacy I realize the patient needs the medicine currently but I encouraged her strongly to stop working for not using this every night when she comes back on her follow-up visit we will be discussing staying away from this medication because of her age and safety issues

## 2019-10-26 ENCOUNTER — Ambulatory Visit: Payer: Medicare PPO | Admitting: Cardiology

## 2019-10-28 ENCOUNTER — Telehealth: Payer: Self-pay | Admitting: Family Medicine

## 2019-10-28 NOTE — Telephone Encounter (Signed)
Please go ahead with orders

## 2019-10-28 NOTE — Telephone Encounter (Signed)
Karen Data processing manager on nurse line to receive verbal orders to get pt recertified for skilled nursing. Clydie Braun would like verbal orders to get pt recertified for 60 days. Pt has been in the hospital recently with CHF and Clydie Braun would like nursing extended. Please advise.   Clydie Braun [phone]

## 2019-10-28 NOTE — Telephone Encounter (Signed)
Verbal orders given  

## 2019-11-08 ENCOUNTER — Ambulatory Visit: Payer: Medicare PPO | Admitting: Internal Medicine

## 2019-11-08 DIAGNOSIS — I5033 Acute on chronic diastolic (congestive) heart failure: Secondary | ICD-10-CM | POA: Diagnosis not present

## 2019-11-08 DIAGNOSIS — J9601 Acute respiratory failure with hypoxia: Secondary | ICD-10-CM | POA: Diagnosis not present

## 2019-11-08 DIAGNOSIS — I110 Hypertensive heart disease with heart failure: Secondary | ICD-10-CM | POA: Diagnosis not present

## 2019-11-24 ENCOUNTER — Other Ambulatory Visit: Payer: Self-pay

## 2019-11-24 ENCOUNTER — Other Ambulatory Visit (INDEPENDENT_AMBULATORY_CARE_PROVIDER_SITE_OTHER): Payer: Medicare PPO | Admitting: *Deleted

## 2019-11-24 DIAGNOSIS — Z23 Encounter for immunization: Secondary | ICD-10-CM | POA: Diagnosis not present

## 2019-11-28 ENCOUNTER — Telehealth: Payer: Self-pay | Admitting: Internal Medicine

## 2019-11-28 NOTE — Telephone Encounter (Signed)
PATIENT NEEDS COMPOUND PRESCRIPTION SENT TO [facility]

## 2019-11-28 NOTE — Telephone Encounter (Signed)
Called pt back and confirmed that she wanted RX for compounded cream to help hemorrhoids.  Confirmed with pt that she is referring to RX sent by Dr. Darrick Penna on 05/12/2019.  Can we send in RX for pt please?

## 2019-11-30 ENCOUNTER — Telehealth: Payer: Self-pay | Admitting: Internal Medicine

## 2019-11-30 ENCOUNTER — Telehealth: Payer: Self-pay

## 2019-11-30 ENCOUNTER — Ambulatory Visit: Payer: Medicare PPO | Admitting: Internal Medicine

## 2019-11-30 DIAGNOSIS — I2510 Atherosclerotic heart disease of native coronary artery without angina pectoris: Secondary | ICD-10-CM | POA: Diagnosis not present

## 2019-11-30 DIAGNOSIS — D6869 Other thrombophilia: Secondary | ICD-10-CM | POA: Diagnosis not present

## 2019-11-30 DIAGNOSIS — I4891 Unspecified atrial fibrillation: Secondary | ICD-10-CM | POA: Diagnosis not present

## 2019-11-30 DIAGNOSIS — I951 Orthostatic hypotension: Secondary | ICD-10-CM | POA: Diagnosis not present

## 2019-11-30 DIAGNOSIS — I110 Hypertensive heart disease with heart failure: Secondary | ICD-10-CM | POA: Diagnosis not present

## 2019-11-30 DIAGNOSIS — F419 Anxiety disorder, unspecified: Secondary | ICD-10-CM | POA: Diagnosis not present

## 2019-11-30 DIAGNOSIS — I509 Heart failure, unspecified: Secondary | ICD-10-CM | POA: Diagnosis not present

## 2019-11-30 DIAGNOSIS — Z811 Family history of alcohol abuse and dependence: Secondary | ICD-10-CM | POA: Diagnosis not present

## 2019-11-30 DIAGNOSIS — Z823 Family history of stroke: Secondary | ICD-10-CM | POA: Diagnosis not present

## 2019-11-30 NOTE — Telephone Encounter (Signed)
Spoke with pt. Pt is aware that Ermalinda Memos PA, called medication in this morning.

## 2019-11-30 NOTE — Telephone Encounter (Signed)
Lmom for pt to call me back so I could inform her.

## 2019-11-30 NOTE — Telephone Encounter (Signed)
Rx has been called in to [facility].   Angie FYI

## 2019-11-30 NOTE — Telephone Encounter (Signed)
Refill request received from [facility] for [facility]othecary Hemorrhoid cr #30, apply to rectum four times daily as needed for pain or bleeding.

## 2019-11-30 NOTE — Telephone Encounter (Signed)
Rx has been called in to [facility].

## 2019-11-30 NOTE — Telephone Encounter (Signed)
Noted  

## 2019-11-30 NOTE — Telephone Encounter (Signed)
Pt's daughter, [name], called to let us know that [facility] has not received the prescription for a compound. Per notes from Ermalinda Memos, [place] is was sent this morning, but I don't see it under med list in epic. Please advise.

## 2019-11-30 NOTE — Telephone Encounter (Signed)
Spoke to pt and she is aware.

## 2019-12-02 ENCOUNTER — Other Ambulatory Visit: Payer: Self-pay | Admitting: Family Medicine

## 2019-12-07 ENCOUNTER — Encounter: Payer: Self-pay | Admitting: Cardiology

## 2019-12-07 NOTE — Progress Notes (Signed)
Cardiology Office Note  Date: 12/08/2019   ID: Katherine, Lane 1932/11/24, MRN [id number]  PCP:  Katherine Drown, MD  Cardiologist:  Katherine Lesches, MD Electrophysiologist:  None   Chief Complaint  Patient presents with  . Cardiac follow-up    History of Present Illness: Katherine Lane is an 86 y.o. female former patient of Dr. Bronson Ing now presenting to establish follow-up with me.  I reviewed her records and updated the chart.  She was most recently seen in August by Mr. Katherine Sake NP.  She is here today with her daughter for a follow-up visit.  Overall, it sounds like she has made progress since cervical fracture earlier this year, also COVID-19 and pneumonia.  She states that her weight fluctuates only up or down by about 2 pounds, she has taken an extra Lasix but on rare occasions since last visit.  She does weigh herself in the mornings.  Describes orthopnea, sleeps on 2 pillows.  She does have some leg swelling typically later in the day, this gets better when she puts her legs up.  Echocardiogram from August of this year revealed LVEF 60 to 65% with moderate diastolic dysfunction, mild RV dysfunction, severe biatrial enlargement, and moderate pulmonary hypertension with PASP estimated 45 mmHg.  I reviewed her medications which are listed below.  At present she is taking Lasix 40 mg every other day with potassium supplement.  Additional regimen includes losartan, hydralazine, and Cardizem CD.  She feels palpitations sometimes when she is at rest or on her left side.  She does have some bruising on Xarelto, but denies any major bleeding episodes or changes in stool.  I reviewed her lab work from August.  She states she will be seeing Dr. Wolfgang Lane next week for a routine visit.  Past Medical History:  Diagnosis Date  . Anxiety   . Arthritis   . Atrial fibrillation ([place])   . Chronic back pain   . Constipation   . Depression   . Diastolic heart failure ([place])   . Essential  hypertension   . History of blood transfusion   . History of cervical fracture   . Insomnia   . Migraine headache   . Osteoporosis   . Trigeminal neuralgia   . 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 ANKLE FRACTURE SURGERY Right   . CARDIAC CATHETERIZATION     denies  . CATARACT EXTRACTION W/PHACO Right 07/22/2012   Procedure: CATARACT EXTRACTION PHACO AND INTRAOCULAR LENS PLACEMENT (IOC);  Surgeon: Katherine Branch, MD;  Location: [facility];  Service: Ophthalmology;  Laterality: Right;  CDE:  18.93  . CATARACT EXTRACTION W/PHACO Left 08/09/2012   Procedure: CATARACT EXTRACTION PHACO AND INTRAOCULAR LENS PLACEMENT (IOC);  Surgeon: Katherine Branch, MD;  Location: [facility];  Service: Ophthalmology;  Laterality: Left;  CDE: 17.21  . COLONOSCOPY    . FLEXIBLE SIGMOIDOSCOPY N/A 11/19/2018   Procedure: FLEXIBLE SIGMOIDOSCOPY;  Surgeon: Katherine Binder, MD;  Location: [facility];  Service: Endoscopy;  Laterality: N/A;  7:30am  . HARDWARE REMOVAL  07/18/2011   Procedure: HARDWARE REMOVAL; Right leg-  Surgeon: Katherine Civil, MD;  Location: [facility];  Service: Orthopedics;  Laterality: Right;  . HEMORRHOID BANDING N/A 11/19/2018   Procedure: HEMORRHOID BANDING;  Surgeon: Katherine Binder, MD;  [facility];  Service: Endoscopy;  Laterality: N/A;  . KYPHOPLASTY N/A 12/26/2016   Procedure: Lumbar two Lumbar three and  Lumbar five KYPHOPLASTY;  Surgeon: Katherine Levine, MD;  Location: [place];  Service: Neurosurgery;  Laterality: N/A;  . KYPHOPLASTY N/A 02/19/2017   Procedure: KYPHOPLASTY LUMBAR ONE;  Surgeon: Katherine Levine, MD;  Location: [place];  Service: Neurosurgery;  Laterality: N/A;  KYPHOPLASTY LUMBAR ONE  . NERVE REPAIR  07/18/2011   Procedure: NERVE REPAIR;  Surgeon: Katherine Civil, MD;  Location: [facility];  Service: Orthopedics;  Laterality: Right;  Right leg superficial peroneal nerve release    . PARTIAL HYSTERECTOMY      Current Outpatient  Medications  Medication Sig Dispense Refill  . diltiazem (CARDIZEM CD) 120 MG 24 hr capsule Take 1 capsule (120 mg total) by mouth daily. 30 capsule 2  . furosemide (LASIX) 40 MG tablet Take 1 tablet (40 mg total) by mouth every other day.    . hydrALAZINE (APRESOLINE) 50 MG tablet Take 1 tablet (50 mg total) by mouth 3 (three) times daily. 120 tablet 6  . hydrocortisone (ANUSOL-HC) 2.5 % rectal cream APPLY RECTALLY THREE TIMES DAILY AS DIRECTED (Patient taking differently: Place 1 application rectally 2 (two) times daily as needed for hemorrhoids or anal itching. ) 30 g 5  . Lidocaine-Hydrocortisone Ace 3-2.5 % KIT APPLY TO RECTUM QID AS NEEDED FOR RECTAL PAIN OR BLEEDING (Patient taking differently: Place 1 application rectally daily as needed (rectal pain or bleeding). ) 1 kit 1  . LORazepam (ATIVAN) 0.5 MG tablet TAKE ONE TABLET BY MOUTH UP TO TWICE DAILY AS NEEDED. (Patient taking differently: Take 0.5 mg by mouth 2 (two) times daily as needed for anxiety or sleep. TAKE ONE TABLET BY MOUTH UP TO TWICE DAILY AS NEEDED.) 60 tablet 5  . losartan (COZAAR) 50 MG tablet Take 1 tablet (50 mg total) by mouth 2 (two) times daily. Daily at 7 AM and at 7 PM 60 tablet 6  . Magnesium 250 MG TABS Take 250 mg by mouth at bedtime.     . metoprolol succinate (TOPROL-XL) 100 MG 24 hr tablet Take 1 tablet (100 mg total) by mouth daily. 30 tablet 11  . Multiple Vitamin (MULTIVITAMIN WITH MINERALS) TABS tablet Take 1 tablet by mouth daily. Centrum Silver    . Polyethyl Glycol-Propyl Glycol (LUBRICANT EYE DROPS) 0.4-0.3 % SOLN Place 1 drop into both eyes at bedtime.    . polyethylene glycol powder (MIRALAX) powder Take 17 g daily as needed by mouth for moderate constipation.     . potassium chloride (KLOR-CON) 10 MEQ tablet Take 1 tablet (10 mEq total) by mouth every other day.    . shark liver oil-cocoa butter (PREPARATION H) 0.25-3-85.5 % suppository Place 1 suppository rectally 2 (two) times daily as needed for  hemorrhoids.     Alveda Reasons 20 MG TABS tablet TAKE 1 TABLET DAILY WITH SUPPER. (Patient taking differently: Take 20 mg by mouth daily with supper. ) 90 tablet 1  . zolpidem (AMBIEN) 5 MG tablet TAKE (1) TABLET BY MOUTH AT BEDTIME FOR SLEEP. 30 tablet 0   No current facility-administered medications for this visit.   Allergies:  Amlodipine, Codeine, Other, and Tramadol   ROS: Poor appetite.  Pruritus.  Physical Exam: VS:  BP 132/78   Pulse 68   Ht '5\' 2"'  (1.575 m)   Wt 118 lb (53.5 kg)   BMI 21.58 kg/m , BMI Body mass index is 21.58 kg/m.  Wt Readings from Last 3 Encounters:  12/08/19 118 lb (53.5 kg)  10/04/19 114 lb 6.4 oz (51.9 kg)  10/03/19 115  lb 6.4 oz (52.3 kg)    General: Elderly woman, appears comfortable at rest. HEENT: Conjunctiva and lids normal, wearing a mask. Neck: Supple, no elevated JVP or carotid bruits, no thyromegaly. Lungs: Clear to auscultation, nonlabored breathing at rest. Cardiac: Irregularly irregular, no S3, soft systolic murmur. Extremities: No pitting edema, venous varicosities and spider veins distally.  ECG:  An ECG dated 09/28/2019 was personally reviewed today and demonstrated:  Atrial fibrillation with poor R wave progression rule out old anterior infarct pattern.  Recent Labwork: 09/27/2019: ALT 15; AST 22; B Natriuretic Peptide 1,418.0; Magnesium 2.0 09/29/2019: BUN 18; Creatinine, Ser 0.87; Hemoglobin 11.1; Platelets 446; Potassium 3.3; Sodium 135     Component Value Date/Time   CHOL 177 09/24/2015 0837   TRIG 71 11/29/2018 0438   HDL 57 09/24/2015 0837   CHOLHDL 3.1 09/24/2015 0837   CHOLHDL 3.1 11/07/2013 0807   VLDL 26 11/07/2013 0807   LDLCALC 93 09/24/2015 0837    Other Studies Reviewed Today:  Echocardiogram 09/28/2019: 1. Left ventricular ejection fraction, by estimation, is 60 to 65%. The  left ventricle has normal function. The left ventricle has no regional  wall motion abnormalities. There is moderate left ventricular  hypertrophy.  Left ventricular diastolic  parameters are consistent with Grade II diastolic dysfunction  (pseudonormalization). Elevated left atrial pressure.  2. Right ventricular systolic function is mildly reduced. The right  ventricular size is moderately enlarged.  3. Left atrial size was severely dilated.  4. Right atrial size was severely dilated.  5. The mitral valve is normal in structure. No evidence of mitral valve  regurgitation. No evidence of mitral stenosis.  6. The aortic valve is tricuspid. Aortic valve regurgitation is not  visualized. No aortic stenosis is present.  7. Severe pulmonary HTN, PASP is 45 mmHg.  8. The inferior vena cava is dilated in size with <50% respiratory  variability, suggesting right atrial pressure of 15 mmHg.   Assessment and Plan:  1..  Continue strategy of heart rate control and anticoagulation.  She is currently on Cardizem CD and Toprol-XL, also Xarelto for stroke prophylaxis.  Reports some bruising but no major bleeding episodes.  I reviewed her lab work from August.  This should be repeated at least within the next 6 months.  2.  Chronic diastolic heart failure.  LVEF 60 to 65% with grade 2 diastolic dysfunction, severe left atrial enlargement.  She also has moderately elevated pulmonary pressures.  Continue present dose of Lasix with potassium supplement, check daily weights.  We did talk about using additional diuretic if weight goes up 3 pounds in 24 hours or 5 pounds in a week.  3.  Essential hypertension, blood pressure is adequately controlled today.  She remains on losartan and hydralazine.  Medication Adjustments/Labs and Tests Ordered: Current medicines are reviewed at length with the patient today.  Concerns regarding medicines are outlined above.   Tests Ordered: No orders of the defined types were placed in this encounter.   Medication Changes: No orders of the defined types were placed in this  encounter.   Disposition:  Follow up 3 months with me in the [place] office.  Signed, Satira Sark, MD, [facility] 12/08/2019 10:19 AM    [facility] [place], [place], [place] [address] Phone: [phone]; Fax: [phone]

## 2019-12-08 ENCOUNTER — Encounter: Payer: Self-pay | Admitting: Cardiology

## 2019-12-08 ENCOUNTER — Ambulatory Visit (INDEPENDENT_AMBULATORY_CARE_PROVIDER_SITE_OTHER): Payer: Medicare PPO | Admitting: Cardiology

## 2019-12-08 ENCOUNTER — Other Ambulatory Visit: Payer: Self-pay

## 2019-12-08 VITALS — BP 132/78 | HR 68 | Ht 62.0 in | Wt 118.0 lb

## 2019-12-08 DIAGNOSIS — I5032 Chronic diastolic (congestive) heart failure: Secondary | ICD-10-CM | POA: Diagnosis not present

## 2019-12-08 DIAGNOSIS — I4819 Other persistent atrial fibrillation: Secondary | ICD-10-CM

## 2019-12-08 DIAGNOSIS — I10 Essential (primary) hypertension: Secondary | ICD-10-CM | POA: Diagnosis not present

## 2019-12-08 NOTE — Patient Instructions (Signed)
Your physician recommends that you schedule a follow-up appointment in: 3 MONTHS WITH DR MCDOWELL Wittenberg   Your physician recommends that you continue on your current medications as directed. Please refer to the Current Medication list given to you today.  Thank you for choosing [facility]!!

## 2019-12-14 ENCOUNTER — Encounter: Payer: Self-pay | Admitting: Family Medicine

## 2019-12-14 ENCOUNTER — Other Ambulatory Visit: Payer: Self-pay

## 2019-12-14 ENCOUNTER — Ambulatory Visit: Payer: Medicare PPO | Admitting: Family Medicine

## 2019-12-14 VITALS — BP 138/84 | HR 77 | Temp 97.4°F | Wt 117.2 lb

## 2019-12-14 DIAGNOSIS — D692 Other nonthrombocytopenic purpura: Secondary | ICD-10-CM

## 2019-12-14 DIAGNOSIS — I10 Essential (primary) hypertension: Secondary | ICD-10-CM | POA: Diagnosis not present

## 2019-12-14 DIAGNOSIS — L299 Pruritus, unspecified: Secondary | ICD-10-CM

## 2019-12-14 DIAGNOSIS — E876 Hypokalemia: Secondary | ICD-10-CM | POA: Diagnosis not present

## 2019-12-14 DIAGNOSIS — Z79899 Other long term (current) drug therapy: Secondary | ICD-10-CM

## 2019-12-14 DIAGNOSIS — D6489 Other specified anemias: Secondary | ICD-10-CM

## 2019-12-14 MED ORDER — LORAZEPAM 0.5 MG PO TABS: 0.5000 mg | ORAL_TABLET | Freq: Two times a day (BID) | ORAL | 5 refills | Status: DC | PRN

## 2019-12-14 MED ORDER — RIVAROXABAN 20 MG PO TABS: ORAL_TABLET | ORAL | 1 refills | Status: DC

## 2019-12-14 MED ORDER — ZOLPIDEM TARTRATE 5 MG PO TABS: ORAL_TABLET | ORAL | 5 refills | Status: DC

## 2019-12-14 MED ORDER — LOSARTAN POTASSIUM 50 MG PO TABS: 50.0000 mg | ORAL_TABLET | Freq: Two times a day (BID) | ORAL | 6 refills | Status: DC

## 2019-12-14 NOTE — Progress Notes (Signed)
   Subjective:    Patient ID: Katherine Lane, female    DOB: Nov 02, 1932, 86 y.o.   MRN: [id number]  Hypertension This is a chronic problem. Pertinent negatives include no chest pain or headaches. There are no compliance problems.    Patient had appointment with cardiology last week.  Patient reports ear feeling stopped up for several weeks and itching all over. Essential hypertension - Plan: CBC with Differential/Platelet, Basic metabolic panel  Hypokalemia - Plan: Basic metabolic panel  Senile purpura (HCC)  Itching - Plan: Hepatic function panel  Anemia due to other cause, not classified - Plan: CBC with Differential/Platelet  High risk medication use - Plan: Hepatic function panel   Review of Systems  Constitutional: Negative for activity change, appetite change and fatigue.  HENT: Negative for congestion.   Respiratory: Negative for cough.   Cardiovascular: Negative for chest pain.  Gastrointestinal: Negative for abdominal pain.  Skin: Negative for color change.  Neurological: Negative for headaches.  Psychiatric/Behavioral: Negative for behavioral problems.       Objective:   Physical Exam Vitals reviewed.  Constitutional:      General: She is not in acute distress. HENT:     Head: Normocephalic and atraumatic.  Eyes:     General:        Right eye: No discharge.        Left eye: No discharge.  Neck:     Trachea: No tracheal deviation.  Cardiovascular:     Rate and Rhythm: Normal rate.     Heart sounds: Normal heart sounds. No murmur heard.   Pulmonary:     Effort: Pulmonary effort is normal. No respiratory distress.     Breath sounds: Normal breath sounds.  Lymphadenopathy:     Cervical: No cervical adenopathy.  Skin:    General: Skin is warm and dry.  Neurological:     Mental Status: She is alert.     Coordination: Coordination normal.  Psychiatric:        Behavior: Behavior normal.           Assessment & Plan:  1. Essential  hypertension Blood pressure good control currently continue current measures watch diet - CBC with Differential/Platelet - Basic metabolic panel  2. Hypokalemia History of hypokalemia check kidney functions. - Basic metabolic panel  3. Senile purpura (HCC) Senile purpura patient is on anticoagulants will check CBC to look at platelets  4. Itching Intermittent itching unlikely to be a sign of any major issues recommend lotion of the skin on a regular basis  5. Anemia due to other cause, not classified CBC ordered - CBC with Differential/Platelet  6. High risk medication use Liver function ordered - Hepatic function panel Insomnia Uses low-dose Ambien.  Patient was encouraged to try to use good sleep hygiene unfortunately we have tried to keep patient off of this medication but 5 mg was the lowest I could titrate her down to without it she just 1 sleep family understands there is some risk of disorientation at nighttime she uses a bedside potty chair she also usually lives in a house to prevent tripping  Lorazepam is used on a as needed basis for anxiety I encourage patient to try utilizing just 1/2 tablet twice daily when necessary  Patient to follow-up in 5 months  Also patient having eustachian tube dysfunction I recommend trying Flonase I do not feel the patient needs an antibiotic

## 2019-12-15 ENCOUNTER — Other Ambulatory Visit: Payer: Self-pay | Admitting: Family Medicine

## 2019-12-15 DIAGNOSIS — I10 Essential (primary) hypertension: Secondary | ICD-10-CM

## 2019-12-15 LAB — CBC WITH DIFFERENTIAL/PLATELET
Basophils Absolute: 0 10*3/uL (ref 0.0–0.2)
Basos: 1 %
EOS (ABSOLUTE): 0.1 10*3/uL (ref 0.0–0.4)
Eos: 2 %
Hematocrit: 36.8 % (ref 34.0–46.6)
Hemoglobin: 11.6 g/dL (ref 11.1–15.9)
Immature Grans (Abs): 0 10*3/uL (ref 0.0–0.1)
Immature Granulocytes: 0 %
Lymphocytes Absolute: 1.1 10*3/uL (ref 0.7–3.1)
Lymphs: 18 %
MCH: 27.4 pg (ref 26.6–33.0)
MCHC: 31.5 g/dL (ref 31.5–35.7)
MCV: 87 fL (ref 79–97)
Monocytes Absolute: 0.6 10*3/uL (ref 0.1–0.9)
Monocytes: 9 %
Neutrophils Absolute: 4.6 10*3/uL (ref 1.4–7.0)
Neutrophils: 70 %
Platelets: 413 10*3/uL (ref 150–450)
RBC: 4.24 x10E6/uL (ref 3.77–5.28)
RDW: 14.9 % (ref 11.7–15.4)
WBC: 6.5 10*3/uL (ref 3.4–10.8)

## 2019-12-15 LAB — BASIC METABOLIC PANEL
BUN/Creatinine Ratio: 16 (ref 12–28)
BUN: 17 mg/dL (ref 8–27)
CO2: 25 mmol/L (ref 20–29)
Calcium: 9.6 mg/dL (ref 8.7–10.3)
Chloride: 100 mmol/L (ref 96–106)
Creatinine, Ser: 1.04 mg/dL — ABNORMAL HIGH (ref 0.57–1.00)
GFR calc Af Amer: 57 mL/min/{1.73_m2} — ABNORMAL LOW (ref >59)
GFR calc non Af Amer: 49 mL/min/{1.73_m2} — ABNORMAL LOW (ref >59)
Glucose: 94 mg/dL (ref 65–99)
Potassium: 4.1 mmol/L (ref 3.5–5.2)
Sodium: 139 mmol/L (ref 134–144)

## 2019-12-15 LAB — HEPATIC FUNCTION PANEL
ALT: 15 IU/L (ref 0–32)
AST: 28 IU/L (ref 0–40)
Albumin: 4.1 g/dL (ref 3.6–4.6)
Alkaline Phosphatase: 130 IU/L — ABNORMAL HIGH (ref 44–121)
Bilirubin Total: 0.8 mg/dL (ref 0.0–1.2)
Bilirubin, Direct: 0.33 mg/dL (ref 0.00–0.40)
Total Protein: 7.2 g/dL (ref 6.0–8.5)

## 2019-12-20 ENCOUNTER — Telehealth: Payer: Self-pay

## 2019-12-20 MED ORDER — FLUTICASONE PROPIONATE 50 MCG/ACT NA SUSP
2.0000 | Freq: Every day | NASAL | 5 refills | Status: DC
Start: 2019-12-20 — End: 2020-07-15

## 2019-12-20 NOTE — Telephone Encounter (Signed)
Patient is requesting a prescription for Flonase to be called into [facility]. She was seen on 10/28.

## 2019-12-20 NOTE — Telephone Encounter (Signed)
Rx sent electronically to pharmacy. Patient notified. 

## 2019-12-20 NOTE — Telephone Encounter (Signed)
Flonase not on med list. Can it be sent in?

## 2019-12-20 NOTE — Telephone Encounter (Signed)
Flonase, 5 refills, 2 sprays each nare daily as directed for allergy issues

## 2019-12-21 DIAGNOSIS — S12041G Nondisplaced lateral mass fracture of first cervical vertebra, subsequent encounter for fracture with delayed healing: Secondary | ICD-10-CM | POA: Diagnosis not present

## 2019-12-21 DIAGNOSIS — I10 Essential (primary) hypertension: Secondary | ICD-10-CM | POA: Diagnosis not present

## 2019-12-21 DIAGNOSIS — S22030D Wedge compression fracture of third thoracic vertebra, subsequent encounter for fracture with routine healing: Secondary | ICD-10-CM | POA: Diagnosis not present

## 2019-12-24 ENCOUNTER — Other Ambulatory Visit: Payer: Self-pay | Admitting: Cardiology

## 2019-12-27 ENCOUNTER — Other Ambulatory Visit: Payer: Self-pay | Admitting: Cardiology

## 2019-12-27 MED ORDER — DILTIAZEM HCL ER COATED BEADS 120 MG PO CP24: ORAL_CAPSULE | ORAL | 3 refills | Status: DC

## 2019-12-27 NOTE — Telephone Encounter (Signed)
Confirmed by e-mail yesterday, med refill sent 7:50 am EST-sent to [facility]   refilled again

## 2019-12-27 NOTE — Telephone Encounter (Signed)
Pt's daughter [name] called stating that she just got off the phone with [facility] and they have no received the pt's refill for her diltiazem (CARDIZEM CD) 120 MG 24 hr capsule

## 2019-12-28 ENCOUNTER — Other Ambulatory Visit: Payer: Self-pay

## 2019-12-28 ENCOUNTER — Telehealth: Payer: Self-pay | Admitting: Family Medicine

## 2019-12-28 ENCOUNTER — Ambulatory Visit (INDEPENDENT_AMBULATORY_CARE_PROVIDER_SITE_OTHER): Payer: Medicare PPO | Admitting: Family Medicine

## 2019-12-28 DIAGNOSIS — R059 Cough, unspecified: Secondary | ICD-10-CM

## 2019-12-28 DIAGNOSIS — R0602 Shortness of breath: Secondary | ICD-10-CM

## 2019-12-28 NOTE — Telephone Encounter (Signed)
Nurses I saw the patient yesterday with a viral illness please reach out to her Thursday morning to see how she is doing in regards to fever repeat how her overall condition  It is possible she may need to have some x-rays or tests possible

## 2019-12-28 NOTE — Progress Notes (Signed)
   Subjective:    Patient ID: Katherine Lane, female    DOB: [birth year], 86 y.o.   MRN: [id number]  Cough This is a new problem. The current episode started yesterday. The cough is productive of sputum. Associated symptoms include ear congestion and a fever. Treatments tried: tylenol.  Low-grade fever past couple days some coughing no wheezing  Energy level energy level subpar  Review of Systems  Constitutional: Positive for fever.  Respiratory: Positive for cough.        Objective:   Physical Exam Patient not in respiratory distress.  Lungs clear no crackles heart rate is controlled HEENT benign  O2 saturation good Covid test taken await results    Assessment & Plan:  Viral syndrome Patient give [place] update tomorrow how she is doing If progressive troubles worsening issues then I would recommend an x-ray  In the near future pneumonia vaccine recommended Covid booster recommended

## 2019-12-29 LAB — SARS-COV-2, NAA 2 DAY TAT

## 2019-12-29 LAB — NOVEL CORONAVIRUS, NAA: SARS-CoV-2, NAA: NOT DETECTED

## 2019-12-29 NOTE — Telephone Encounter (Signed)
Daughter states the patient has had no fever last night or today and her cough is better or less than it was but she still as a lot of phlem in her throat and feels about the same- O2 sat 95%

## 2019-12-29 NOTE — Telephone Encounter (Signed)
Left message to return call 

## 2019-12-30 ENCOUNTER — Encounter: Payer: Self-pay | Admitting: Family Medicine

## 2019-12-30 ENCOUNTER — Other Ambulatory Visit: Payer: Self-pay | Admitting: Student

## 2019-12-30 ENCOUNTER — Ambulatory Visit (HOSPITAL_COMMUNITY)
Admission: RE | Admit: 2019-12-30 | Discharge: 2019-12-30 | Disposition: A | Discharge status: Home / Self Care | Payer: Medicare PPO | Source: Ambulatory Visit | Attending: Family Medicine | Admitting: Family Medicine

## 2019-12-30 ENCOUNTER — Ambulatory Visit (INDEPENDENT_AMBULATORY_CARE_PROVIDER_SITE_OTHER): Payer: Medicare PPO | Admitting: Family Medicine

## 2019-12-30 ENCOUNTER — Other Ambulatory Visit: Payer: Self-pay

## 2019-12-30 VITALS — BP 130/78 | HR 72 | Temp 96.9°F | Wt 116.8 lb

## 2019-12-30 DIAGNOSIS — J90 Pleural effusion, not elsewhere classified: Secondary | ICD-10-CM | POA: Diagnosis not present

## 2019-12-30 DIAGNOSIS — R0602 Shortness of breath: Secondary | ICD-10-CM | POA: Diagnosis not present

## 2019-12-30 DIAGNOSIS — R059 Cough, unspecified: Secondary | ICD-10-CM | POA: Insufficient documentation

## 2019-12-30 DIAGNOSIS — J189 Pneumonia, unspecified organism: Secondary | ICD-10-CM | POA: Diagnosis not present

## 2019-12-30 DIAGNOSIS — J019 Acute sinusitis, unspecified: Secondary | ICD-10-CM

## 2019-12-30 DIAGNOSIS — I517 Cardiomegaly: Secondary | ICD-10-CM | POA: Diagnosis not present

## 2019-12-30 DIAGNOSIS — J180 Bronchopneumonia, unspecified organism: Secondary | ICD-10-CM | POA: Diagnosis not present

## 2019-12-30 MED ORDER — AZITHROMYCIN 250 MG PO TABS: ORAL_TABLET | ORAL | 0 refills | Status: DC

## 2019-12-30 NOTE — Telephone Encounter (Signed)
STAT chest x ray ordered and pt daughter is aware. Pt daughter [name] states they will need to do the xray after their visit today.

## 2019-12-30 NOTE — Telephone Encounter (Signed)
I talked with the patient this morning.  She will need a stat chest x-ray.  Her daughter called and scheduled her to be seen at 1:00.  If they are interested in doing this stat chest x-ray before the visit they can go literally at any time.  [name] works.  But if they cannot go before the visit they can certainly come here at 1:00 I will see Katherine Lane then we will send her for chest x-ray. Please go ahead and put in the order for a stat chest x-ray due to cough and shortness of breath

## 2019-12-30 NOTE — Addendum Note (Signed)
Addended by: Marlowe Shores on: 12/30/2019 08:59 AM   Modules accepted: Orders

## 2019-12-30 NOTE — Progress Notes (Signed)
   Subjective:    Patient ID: Katherine Lane, female    DOB: 1932-04-28, 86 y.o.   MRN: [id number]  HPI Pt here for recheck. Pt is no better; pt states she is worse. Spitting up dark brown phelgm, coughing non stop. Pt has had some shortness of breath but pt states she has that sometimes.    Review of Systems  Constitutional: Negative for activity change, fatigue and fever.  HENT: Negative for congestion and rhinorrhea.   Respiratory: Negative for cough, chest tightness and shortness of breath.   Cardiovascular: Negative for chest pain and leg swelling.  Gastrointestinal: Negative for abdominal pain and nausea.  Skin: Negative for color change.  Neurological: Negative for dizziness and headaches.  Psychiatric/Behavioral: Negative for agitation and behavioral problems.       Objective:   Physical Exam Lungs clear no crackles no rales no rhonchi HEENT benign Bronchial tightness noted.  Would benefit from an inhaler.      Assessment & Plan:  Z-Pak as prescribed for probable acute rhinosinusitis with bronchial involvement warning signs discussed follow-up if problems Reactive airway albuterol as needed if worsening condition follow-up

## 2020-01-02 ENCOUNTER — Encounter: Payer: Self-pay | Admitting: Family Medicine

## 2020-01-02 ENCOUNTER — Ambulatory Visit: Payer: Medicare PPO | Admitting: Family Medicine

## 2020-01-02 ENCOUNTER — Other Ambulatory Visit: Payer: Self-pay

## 2020-01-02 VITALS — BP 128/84 | HR 82 | Temp 98.0°F | Ht 62.0 in | Wt 119.0 lb

## 2020-01-02 DIAGNOSIS — J189 Pneumonia, unspecified organism: Secondary | ICD-10-CM

## 2020-01-02 DIAGNOSIS — Z23 Encounter for immunization: Secondary | ICD-10-CM | POA: Diagnosis not present

## 2020-01-02 NOTE — Patient Instructions (Signed)
I'm pleased how you are doing. If worse as the week goes by please call.

## 2020-01-02 NOTE — Progress Notes (Signed)
   Subjective:    Patient ID: Katherine Lane, female    DOB: [birth year], 86 y.o.   MRN: [id number]  HPIfollow up. Pt states she is feeling better. Did cough up some brown mucus this morning. On last day of azithromycin.  Patient being treated for recent lung infection.  Finished up the azithromycin.  States energy level doing better appetite fair energy level still subpar denies wheezing difficulty breathing no sweats chills or vomiting   Review of Systems Please see above    Objective:   Physical Exam Lungs clear respiratory rate normal heart regular HEENT benign   Pneumococcal 23 shot today    Assessment & Plan:  Pneumonia follow-up doing well finishing the azithromycin today this will stay in her system for 5 more days warning signs were discussed in detail follow-up if progressive troubles

## 2020-01-04 NOTE — Patient Instructions (Signed)
How to Use a Metered Dose Inhaler A metered dose inhaler is a handheld device for taking medicine that must be breathed into the lungs (inhaled). The device can be used to deliver a variety of inhaled medicines, including:  Quick relief or rescue medicines, such as bronchodilators.  Controller medicines, such as corticosteroids. The medicine is delivered by pushing down on a metal canister to release a preset amount of spray and medicine. Each device contains the amount of medicine that is needed for a preset number of uses (inhalations). Your health care provider may recommend that you use a spacer with your inhaler to help you take the medicine more effectively. A spacer is a plastic tube with a mouthpiece on one end and an opening that connects to the inhaler on the other end. A spacer holds the medicine in a tube for a short time, which allows you to inhale more medicine. What are the risks? If you do not use your inhaler correctly, medicine might not reach your lungs to help you breathe. Inhaler medicine can cause side effects, such as:  Mouth or throat infection.  Cough.  Hoarseness.  Headache.  Nausea and vomiting.  Lung infection (pneumonia) in people who have a lung condition called COPD. How to use a metered dose inhaler without a spacer  1. Remove the cap from the inhaler. 2. If you are using the inhaler for the first time, shake it for 5 seconds, turn it away from your face, then release 4 puffs into the air. This is called priming. 3. Shake the inhaler for 5 seconds. 4. Position the inhaler so the top of the canister faces up. 5. Put your index finger on the top of the medicine canister. Support the bottom of the inhaler with your thumb. 6. Breathe out normally and as completely as possible, away from the inhaler. 7. Either place the inhaler between your teeth and close your lips tightly around the mouthpiece, or hold the inhaler 1-2 inches (2.5-5 cm) away from your open  mouth. Keep your tongue down out of the way. If you are unsure which technique to use, ask your health care provider. 8. Press the canister down with your index finger to release the medicine, then inhale deeply and slowly through your mouth (not your nose) until your lungs are completely filled. Inhaling should take 4-6 seconds. 9. Hold the medicine in your lungs for 5-10 seconds (10 seconds is best). This helps the medicine get into the small airways of your lungs. 10. With your lips in a tight circle (pursed), breathe out slowly. 11. Repeat steps 3-10 until you have taken the number of puffs that your health care provider directed. Wait about 1 minute between puffs or as directed. 12. Put the cap on the inhaler. 13. If you are using a steroid inhaler, rinse your mouth with water, gargle, and spit out the water. Do not swallow the water. How to use a metered dose inhaler with a spacer  1. Remove the cap from the inhaler. 2. If you are using the inhaler for the first time, shake it for 5 seconds, turn it away from your face, then release 4 puffs into the air. This is called priming. 3. Shake the inhaler for 5 seconds. 4. Place the open end of the spacer onto the inhaler mouthpiece. 5. Position the inhaler so the top of the canister faces up and the spacer mouthpiece faces you. 6. Put your index finger on the top of the medicine canister.   Support the bottom of the inhaler and the spacer with your thumb. 7. Breathe out normally and as completely as possible, away from the spacer. 8. Place the spacer between your teeth and close your lips tightly around it. Keep your tongue down out of the way. 9. Press the canister down with your index finger to release the medicine, then inhale deeply and slowly through your mouth (not your nose) until your lungs are completely filled. Inhaling should take 4-6 seconds. 10. Hold the medicine in your lungs for 5-10 seconds (10 seconds is best). This helps the  medicine get into the small airways of your lungs. 11. With your lips in a tight circle (pursed), breathe out slowly. 12. Repeat steps 3-11 until you have taken the number of puffs that your health care provider directed. Wait about 1 minute between puffs or as directed. 13. Remove the spacer from the inhaler and put the cap on the inhaler. 14. If you are using a steroid inhaler, rinse your mouth with water, gargle, and spit out the water. Do not swallow the water. Follow these instructions at home:  Take your inhaled medicine only as told by your health care provider. Do not use the inhaler more than directed by your health care provider.  Keep all follow-up visits as told by your health care provider. This is important.  If your inhaler has a counter, you can check it to determine how full your inhaler is. If your inhaler does not have a counter, ask your health care provider when you will need to refill your inhaler and write the refill date on a calendar or on your inhaler canister. Note that you cannot know when an inhaler is empty by shaking it.  Follow directions on the package insert for care and cleaning of your inhaler and spacer. Contact a health care provider if:  Symptoms are only partially relieved with your inhaler.  You are having trouble using your inhaler.  You have an increase in phlegm.  You have headaches. Get help right away if:  You feel little or no relief after using your inhaler.  You have dizziness.  You have a fast heart rate.  You have chills or a fever.  You have night sweats.  There is blood in your phlegm. Summary  A metered dose inhaler is a handheld device for taking medicine that must be breathed into the lungs (inhaled).  The medicine is delivered by pushing down on a metal canister to release a preset amount of spray and medicine.  Each device contains the amount of medicine that is needed for a preset number of uses (inhalations). This  information is not intended to replace advice given to you by your health care provider. Make sure you discuss any questions you have with your health care provider. Document Revised: 01/16/2017 Document Reviewed: 12/25/2015 Elsevier Patient Education  2020 [employer]   

## 2020-01-09 ENCOUNTER — Telehealth: Payer: Self-pay | Admitting: Cardiology

## 2020-01-09 NOTE — Telephone Encounter (Signed)
Daughter says she was instructed to contact our office if patient's weight increased Reports weights have been taken all throughout the day and none early morning upon waking up Per daughter, baseline weight is usually around 116 lbs Weight today 119 lbs and taken 3 hours ago not early morning Reports swelling in legs in feet are worse later on in the day but little to none upon waking up in the morning Reports SOB all the time but since Friday, has more SOB and was recently treated for pneumonia Denies chest pain or dizziness Medications reviewed Advised to weigh daily at same, keep legs elevated. Advised that patient was instructed that she may take an extra fluid pill for weight gain of 3 lbs/24 hour or 5 lbs/week an d also to contact our office. Advised if SOB get worse, to go to the ED for an evaluation.  Verbalized understanding of plan.

## 2020-01-09 NOTE — Telephone Encounter (Signed)
Per phone call from pt's daughter Dionicia Abler- she called stating that the pt is  having problems again w/ her fluid building up again.   Since Friday and over the weekend, her weight is around 121lbs.   Pt has been taking extra medication over the weekend, but now she's up to 119lbs again this morning.  Please call [name] [phone]

## 2020-01-10 DIAGNOSIS — I10 Essential (primary) hypertension: Secondary | ICD-10-CM | POA: Diagnosis not present

## 2020-01-11 ENCOUNTER — Other Ambulatory Visit: Payer: Self-pay | Admitting: Family Medicine

## 2020-01-11 ENCOUNTER — Telehealth: Payer: Self-pay

## 2020-01-11 DIAGNOSIS — I10 Essential (primary) hypertension: Secondary | ICD-10-CM

## 2020-01-11 LAB — BASIC METABOLIC PANEL
BUN/Creatinine Ratio: 18 (ref 12–28)
BUN: 20 mg/dL (ref 8–27)
CO2: 22 mmol/L (ref 20–29)
Calcium: 9.2 mg/dL (ref 8.7–10.3)
Chloride: 94 mmol/L — ABNORMAL LOW (ref 96–106)
Creatinine, Ser: 1.14 mg/dL — ABNORMAL HIGH (ref 0.57–1.00)
GFR calc Af Amer: 51 mL/min/{1.73_m2} — ABNORMAL LOW (ref >59)
GFR calc non Af Amer: 44 mL/min/{1.73_m2} — ABNORMAL LOW (ref >59)
Glucose: 95 mg/dL (ref 65–99)
Potassium: 4.5 mmol/L (ref 3.5–5.2)
Sodium: 132 mmol/L — ABNORMAL LOW (ref 134–144)

## 2020-01-11 NOTE — Telephone Encounter (Signed)
Pt daughter [name] contacted and went over lab results with her. Pt daughter verbalized understanding.

## 2020-01-11 NOTE — Telephone Encounter (Signed)
Katherine Lane called and wanted someone one to go over instructions left for [place]. Esperanza was real confused and was not understanding   Robin call back [phone]

## 2020-01-25 ENCOUNTER — Telehealth: Payer: Self-pay | Admitting: Cardiology

## 2020-01-25 DIAGNOSIS — H35363 Drusen (degenerative) of macula, bilateral: Secondary | ICD-10-CM | POA: Diagnosis not present

## 2020-01-25 DIAGNOSIS — I5032 Chronic diastolic (congestive) heart failure: Secondary | ICD-10-CM

## 2020-01-25 MED ORDER — FUROSEMIDE 40 MG PO TABS: 40.0000 mg | ORAL_TABLET | Freq: Every day | ORAL | 6 refills | Status: DC

## 2020-01-25 NOTE — Telephone Encounter (Signed)
New message      Pt c/o swelling: STAT is pt has developed SOB within 24 hours  1) How much weight have you gained and in what time span? 5lb increase in 4 days  2) If swelling, where is the swelling located?  Mainly in feet and ankle   3) Are you currently taking a fluid pill? Yes   4) Are you currently SOB? Its has been worse   5) Do you have a log of your daily weights (if so, list)? See below   6) Have you gained 3 pounds in a day or 5 pounds in a week? yes  7) Have you traveled recently? no  12/4 116   12/6 121 12/7 121 12/8 121

## 2020-01-25 NOTE — Telephone Encounter (Addendum)
In addition to lasix increase-Per [place]-No change in potassium yet. Can check BMET after being back on regular Lasix dose for about a week   Daughter informed and verbalized understanding of plan. Lab order faxed to [facility]

## 2020-01-25 NOTE — Telephone Encounter (Signed)
Per daughter, weights have been increasing and patient continues to have more swelling and SOB later on in the day. See weight below:  12/04 116 12/05 119 12/06 121 12/07 122 12/08 121 Has been taking lasix 40 mg daily for the past 5 days vs every other day d/t swelling and sob Denies chest pain but has lightheadedness for awhile Daughter currently not at home and is unable to give exact name and dose of medications but says all meds are still the same Reports drinking same amount of fluids and has not been voiding as much

## 2020-01-25 NOTE — Telephone Encounter (Signed)
In that case I would have her take Lasix 60 mg daily for the next few days to get her weight back toward prior baseline and then keep her standing dose 40 mg daily.

## 2020-01-30 DIAGNOSIS — H6521 Chronic serous otitis media, right ear: Secondary | ICD-10-CM | POA: Diagnosis not present

## 2020-01-30 DIAGNOSIS — J343 Hypertrophy of nasal turbinates: Secondary | ICD-10-CM | POA: Diagnosis not present

## 2020-01-30 DIAGNOSIS — H9011 Conductive hearing loss, unilateral, right ear, with unrestricted hearing on the contralateral side: Secondary | ICD-10-CM | POA: Diagnosis not present

## 2020-01-30 DIAGNOSIS — J310 Chronic rhinitis: Secondary | ICD-10-CM | POA: Diagnosis not present

## 2020-01-30 DIAGNOSIS — H6981 Other specified disorders of Eustachian tube, right ear: Secondary | ICD-10-CM | POA: Diagnosis not present

## 2020-02-01 ENCOUNTER — Telehealth: Payer: Self-pay | Admitting: *Deleted

## 2020-02-01 ENCOUNTER — Other Ambulatory Visit (HOSPITAL_COMMUNITY)
Admission: RE | Admit: 2020-02-01 | Discharge: 2020-02-01 | Disposition: A | Discharge status: Home / Self Care | Payer: Medicare PPO | Source: Ambulatory Visit | Attending: Cardiology | Admitting: Cardiology

## 2020-02-01 ENCOUNTER — Other Ambulatory Visit: Payer: Self-pay

## 2020-02-01 DIAGNOSIS — I5032 Chronic diastolic (congestive) heart failure: Secondary | ICD-10-CM | POA: Diagnosis not present

## 2020-02-01 LAB — BASIC METABOLIC PANEL
Anion gap: 10 (ref 5–15)
BUN: 22 mg/dL (ref 8–23)
CO2: 26 mmol/L (ref 22–32)
Calcium: 9.3 mg/dL (ref 8.9–10.3)
Chloride: 98 mmol/L (ref 98–111)
Creatinine, Ser: 1.2 mg/dL — ABNORMAL HIGH (ref 0.44–1.00)
GFR, Estimated: 44 mL/min — ABNORMAL LOW (ref >60)
Glucose, Bld: 121 mg/dL — ABNORMAL HIGH (ref 70–99)
Potassium: 4.1 mmol/L (ref 3.5–5.1)
Sodium: 134 mmol/L — ABNORMAL LOW (ref 135–145)

## 2020-02-01 NOTE — Telephone Encounter (Signed)
-----   Message from Eustace Moore, RN sent at 02/01/2020 12:35 PM EST -----  ----- Message ----- From: Jonelle Sidle, MD Sent: 02/01/2020  11:53 AM EST To: Eustace Moore, RN  Results reviewed.  Potassium normal at 4.1, creatinine 1.2 bumped up from 1.14.  Lasix was recently uptitrated temporarily.  No change in present regimen.

## 2020-02-01 NOTE — Telephone Encounter (Signed)
Patient's daughter informed. Copy sent to PCP 

## 2020-02-04 ENCOUNTER — Other Ambulatory Visit: Payer: Self-pay | Admitting: Cardiology

## 2020-02-18 HISTORY — PX: BACK SURGERY: SHX140

## 2020-02-21 ENCOUNTER — Ambulatory Visit: Payer: Medicare PPO | Admitting: Cardiology

## 2020-02-21 ENCOUNTER — Encounter: Payer: Self-pay | Admitting: Cardiology

## 2020-02-21 ENCOUNTER — Other Ambulatory Visit: Payer: Self-pay

## 2020-02-21 VITALS — BP 118/74 | HR 71 | Ht 63.5 in | Wt 125.0 lb

## 2020-02-21 DIAGNOSIS — I482 Chronic atrial fibrillation, unspecified: Secondary | ICD-10-CM | POA: Diagnosis not present

## 2020-02-21 DIAGNOSIS — I5033 Acute on chronic diastolic (congestive) heart failure: Secondary | ICD-10-CM

## 2020-02-21 DIAGNOSIS — I4819 Other persistent atrial fibrillation: Secondary | ICD-10-CM | POA: Diagnosis not present

## 2020-02-21 DIAGNOSIS — I10 Essential (primary) hypertension: Secondary | ICD-10-CM

## 2020-02-21 DIAGNOSIS — K625 Hemorrhage of anus and rectum: Secondary | ICD-10-CM

## 2020-02-21 DIAGNOSIS — Z7901 Long term (current) use of anticoagulants: Secondary | ICD-10-CM

## 2020-02-21 DIAGNOSIS — I5032 Chronic diastolic (congestive) heart failure: Secondary | ICD-10-CM

## 2020-02-21 NOTE — Assessment & Plan Note (Signed)
Acute on chronic diastolic and Rt heart failure.  Increase Lasix to 40 mg BID for 3 days the re evaluate.  Check labs later this week- CBC, BNP, BMP

## 2020-02-21 NOTE — Patient Instructions (Signed)
Medication Instructions:   Your physician has recommended you make the following change in your medication:   Take Lasix 40 mg Two Times Daily for 3 Day then Take 40 mg Lasix Daily   *If you need a refill on your cardiac medications before your next appointment, please call your pharmacy*   Lab Work: Your physician recommends that you return for lab work on Thursday.   If you have labs (blood work) drawn today and your tests are completely normal, you will receive your results only by: Marland Kitchen MyChart Message (if you have MyChart) OR . A paper copy in the mail If you have any lab test that is abnormal or we need to change your treatment, we will call you to review the results.   Testing/Procedures: NONE    Follow-Up: At [facility], you and your health needs are our priority.  As part of our continuing mission to provide you with exceptional heart care, we have created designated Provider Care Teams.  These Care Teams include your primary Cardiologist (physician) and Advanced Practice Providers (APPs -  Physician Assistants and Nurse Practitioners) who all work together to provide you with the care you need, when you need it.  We recommend signing up for the patient portal called "MyChart".  Sign up information is provided on this After Visit Summary.  MyChart is used to connect with patients for Virtual Visits (Telemedicine).  Patients are able to view lab/test results, encounter notes, upcoming appointments, etc.  Non-urgent messages can be sent to your provider as well.   To learn more about what you can do with MyChart, go to [email or web address].    Your next appointment:    As scheduled   The format for your next appointment:   In Person  Provider:   Nona Dell, MD   Other Instructions Thank you for choosing [facility]!

## 2020-02-21 NOTE — Progress Notes (Signed)
Cardiology Office Note:    Date:  02/21/2020   ID:  NASIAH Lane, DOB May 19, 1932, MRN [id number]  PCP:  Babs Sciara, MD  Cardiologist:  Nona Dell, MD  Electrophysiologist:  None   Referring MD: Babs Sciara, MD   CC: edema, DOE, increased weight  History of Present Illness:    Katherine Lane is a 87 y.o. female with a hx of chronic diastolic heart failure, CAF, and pulmonary hypertension.  Echocardiogram August 2021 showed an EF of 60 to 65% with moderate diastolic dysfunction and mild RV dysfunction, severe biatrial enlargement, and moderate pulmonary hypertension with a PA pressure of 45.  She has been on Lasix, she does increase her dose when she feels like she needs to.  She was hospitalized with heart failure in August 2021.  Her discharge weight was about 116 pounds and that is been her goal.  She is in the office today accompanied by her daughter.  The patient's had increasing lower extremity edema, increasing dyspnea on exertion, and increasing weight over the last several weeks.  Her weight is drifted up to 122 pounds.  The patient has shortness of breath walking across the room.  She is also noted increasing lower extremity edema.  They have tried adjusting her diuretics to 60 mg a day but it does not seem to help much.  Past Medical History:  Diagnosis Date  . Anxiety   . Arthritis   . Atrial fibrillation (HCC)   . Chronic back pain   . Constipation   . Depression   . Diastolic heart failure (HCC)   . Essential hypertension   . History of blood transfusion   . History of cervical fracture   . Insomnia   . Migraine headache   . Osteoporosis   . Trigeminal neuralgia   . 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 ANKLE FRACTURE SURGERY Right   . CARDIAC CATHETERIZATION     denies  . CATARACT EXTRACTION W/PHACO Right 07/22/2012   Procedure: CATARACT EXTRACTION PHACO AND INTRAOCULAR LENS PLACEMENT (IOC);   Surgeon: Gemma Payor, MD;  Location: [facility] ORS;  Service: Ophthalmology;  Laterality: Right;  CDE:  18.93  . CATARACT EXTRACTION W/PHACO Left 08/09/2012   Procedure: CATARACT EXTRACTION PHACO AND INTRAOCULAR LENS PLACEMENT (IOC);  Surgeon: Gemma Payor, MD;  Location: [facility] ORS;  Service: Ophthalmology;  Laterality: Left;  CDE: 17.21  . COLONOSCOPY    . FLEXIBLE SIGMOIDOSCOPY N/A 11/19/2018   Procedure: FLEXIBLE SIGMOIDOSCOPY;  Surgeon: West Bali, MD;  Location: [facility] ENDO SUITE;  Service: Endoscopy;  Laterality: N/A;  7:30am  . HARDWARE REMOVAL  07/18/2011   Procedure: HARDWARE REMOVAL; Right leg-  Surgeon: Vickki Hearing, MD;  Location: [facility] ORS;  Service: Orthopedics;  Laterality: Right;  . HEMORRHOID BANDING N/A 11/19/2018   Procedure: HEMORRHOID BANDING;  Surgeon: West Bali, MD;  Location: [facility] SU[facility];  Service: Endoscopy;  Laterality: N/A;  . KYPHOPLASTY N/A 12/26/2016   Procedure: Lumbar two Lumbar three and Lumbar five KYPHOPLASTY;  Surgeon: Maeola Harman, MD;  Location: [facility] OR;  Service: Neurosurgery;  Laterality: N/A;  . KYPHOPLASTY N/A 02/19/2017   Procedure: KYPHOPLASTY LUMBAR ONE;  Surgeon: Maeola Harman, MD;  Location: [facility] OR;  Service: Neurosurgery;  Laterality: N/A;  KYPHOPLASTY LUMBAR ONE  . NERVE REPAIR  07/18/2011   Procedure: NERVE REPAIR;  Surgeon: Vickki Hearing, MD;  Location: [facility] ORS;  Service: Orthopedics;  Laterality: Right;  Right leg superficial peroneal nerve release    . PARTIAL HYSTERECTOMY      Current Medications: No outpatient medications have been marked as taking for the 02/21/20 encounter (Office Visit) with Abelino Derrick, PA-C.     Allergies:   Amlodipine, Codeine, Other, and Tramadol   Social History   Socioeconomic History  . Marital status: Widowed    Spouse name: Not on file  . Number of children: 3  . Years of education: Not on file  . Highest education level: Not on file  Occupational History  . Occupation: retired    Associate Professor: RETIRED   Tobacco Use  . Smoking status: Never Smoker  . Smokeless tobacco: Never Used  Vaping Use  . Vaping Use: Never used  Substance and Sexual Activity  . Alcohol use: No    Alcohol/week: 0.0 standard drinks  . Drug use: No  . Sexual activity: Not Currently    Birth control/protection: Surgical    Comment: hyst  Other Topics Concern  . Not on file  Social History Narrative  . Not on file   Social Determinants of Health   Financial Resource Strain: Not on file  Food Insecurity: Not on file  Transportation Needs: Not on file  Physical Activity: Not on file  Stress: Not on file  Social Connections: Not on file     Family History: The patient's family history includes Alzheimer's disease in her mother; Cancer in her mother; Diabetes in her brother and daughter; Hypertension in her father and mother; Other in her brother and father. There is no history of Anesthesia problems, Hypotension, Malignant hyperthermia, or Pseudochol deficiency.  ROS:   Please see the history of present illness.     All other systems reviewed and are negative.  EKGs/Labs/Other Studies Reviewed:    The following studies were reviewed today: Echo 09/28/2019- IMPRESSIONS    1. Left ventricular ejection fraction, by estimation, is 60 to 65%. The  left ventricle has normal function. The left ventricle has no regional  wall motion abnormalities. There is moderate left ventricular hypertrophy.  Left ventricular diastolic  parameters are consistent with Grade II diastolic dysfunction  (pseudonormalization). Elevated left atrial pressure.  2. Right ventricular systolic function is mildly reduced. The right  ventricular size is moderately enlarged.  3. Left atrial size was severely dilated.  4. Right atrial size was severely dilated.  5. The mitral valve is normal in structure. No evidence of mitral valve  regurgitation. No evidence of mitral stenosis.  6. The aortic valve is tricuspid. Aortic valve  regurgitation is not  visualized. No aortic stenosis is present.  7. Severe pulmonary HTN, PASP is 45 mmHg.  8. The inferior vena cava is dilated in size with <50% respiratory  variability, suggesting right atrial pressure of 15 mmHg.   EKG:  EKG is not ordered today.  The ekg ordered 09/28/2019 demonstrates AF with VR 102, poor anterior RW  Recent Labs: 09/27/2019: B Natriuretic Peptide 1,418.0; Magnesium 2.0 12/14/2019: ALT 15; Hemoglobin 11.6; Platelets 413 02/01/2020: BUN 22; Creatinine, Ser 1.20; Potassium 4.1; Sodium 134  Recent Lipid Panel    Component Value Date/Time   CHOL 177 09/24/2015 0837   TRIG 71 11/29/2018 0438   HDL 57 09/24/2015 0837   CHOLHDL 3.1 09/24/2015 0837   CHOLHDL 3.1 11/07/2013 0807   VLDL 26 11/07/2013 0807   LDLCALC 93 09/24/2015 0837    Physical Exam:    VS:  BP 118/74   Pulse 71   Ht  5' 3.5" (1.613 m)   Wt 125 lb (56.7 kg)   SpO2 95%   BMI 21.80 kg/m     Wt Readings from Last 3 Encounters:  02/21/20 125 lb (56.7 kg)  01/02/20 119 lb (54 kg)  12/30/19 116 lb 12.8 oz (53 kg)     GEN: Elderly pale female, well developed in no acute distress HEENT: Normal NECK: No JVD; No carotid bruits CARDIAC: irregularly irregualr, no murmurs, rubs, gallops RESPIRATORY:  Clear to auscultation without rales, wheezing or rhonchi  ABDOMEN: Soft,  non-distended MUSCULOSKELETAL:  1+ pitting pretibial edema; No deformity  SKIN: Warm and dry NEUROLOGIC:  Alert and oriented x 3 PSYCHIATRIC:  Normal affect   ASSESSMENT:    Acute on chronic diastolic CHF (congestive heart failure) (HCC) Acute on chronic diastolic and Rt heart failure.  Increase Lasix to 40 mg BID for 3 days the re evaluate.  Check labs later this week- CBC, BNP, BMP  Chronic atrial fibrillation (HCC) Rate controlled  Essential hypertension B/P controlled- if her B/P drops with diuresis consider backing off on her Hydralazine  Anticoagulated Check CBC- she has chronic   Rectal  bleeding H/O hemorrhoids  PLAN:    Increase Lasix to 40 mg BID x 3 days. Check labs later this week.  Keep f/u with Dr Diona Browner as directed.  If her symptoms worsen despite increased Lasix I expect she will need admission.    Medication Adjustments/Labs and Tests Ordered: Current medicines are reviewed at length with the patient today.  Concerns regarding medicines are outlined above.  Orders Placed This Encounter  Procedures  . Basic Metabolic Panel (BMET)  . B Nat Peptide  . CBC   No orders of the defined types were placed in this encounter.   Patient Instructions  Medication Instructions:   Your physician has recommended you make the following change in your medication:   Take Lasix 40 mg Two Times Daily for 3 Day then Take 40 mg Lasix Daily   *If you need a refill on your cardiac medications before your next appointment, please call your pharmacy*   Lab Work: Your physician recommends that you return for lab work on Thursday.   If you have labs (blood work) drawn today and your tests are completely normal, you will receive your results only by: Marland Kitchen MyChart Message (if you have MyChart) OR . A paper copy in the mail If you have any lab test that is abnormal or we need to change your treatment, we will call you to review the results.   Testing/Procedures: NONE    Follow-Up: At [facility], you and your health needs are our priority.  As part of our continuing mission to provide you with exceptional heart care, we have created designated Provider Care Teams.  These Care Teams include your primary Cardiologist (physician) and Advanced Practice Providers (APPs -  Physician Assistants and Nurse Practitioners) who all work together to provide you with the care you need, when you need it.  We recommend signing up for the patient portal called "MyChart".  Sign up information is provided on this After Visit Summary.  MyChart is used to connect with patients for Virtual Visits  (Telemedicine).  Patients are able to view lab/test results, encounter notes, upcoming appointments, etc.  Non-urgent messages can be sent to your provider as well.   To learn more about what you can do with MyChart, go to [email or web address].    Your next appointment:    As scheduled  The format for your next appointment:   In Person  Provider:   Rozann Lesches, MD   Other Instructions Thank you for choosing [facility]!       Signed, Kerin Ransom, PA-C  02/21/2020 2:45 PM    [facility]

## 2020-02-21 NOTE — Assessment & Plan Note (Signed)
Rate controlled 

## 2020-02-21 NOTE — Assessment & Plan Note (Signed)
H/O hemorrhoids

## 2020-02-21 NOTE — Assessment & Plan Note (Signed)
B/P controlled- if her B/P drops with diuresis consider backing off on her Hydralazine

## 2020-02-21 NOTE — Assessment & Plan Note (Signed)
Check CBC- she has chronic

## 2020-02-22 ENCOUNTER — Telehealth: Payer: Self-pay | Admitting: Cardiology

## 2020-02-22 NOTE — Telephone Encounter (Signed)
BP today is 102/76 was told to call if the top # got close to 100- per phone call from pt's daughter [name].   Please call [phone] if the medication needs to be changed.

## 2020-02-22 NOTE — Telephone Encounter (Signed)
Called patients daughter who states that patient is not having any pain, sob is normal with her chf. Daughter stated that [name], [place] said yesterday in his note that if patients b/p dropped to the lower 100's, she should call. [name] also stated in his note that patient should cut back on the hydralazine if b/p dropped.

## 2020-02-22 NOTE — Telephone Encounter (Signed)
Called patient and verbalized the advice Dr. Diona Browner gave. She understood. She is to call us back if anything further is needed.

## 2020-02-22 NOTE — Telephone Encounter (Signed)
I reviewed the note from Mr. Lenn Cal yesterday.  Decrease hydralazine to 25 mg 3 times a day.  Continue to track blood pressure.

## 2020-02-23 ENCOUNTER — Other Ambulatory Visit: Payer: Self-pay

## 2020-02-23 ENCOUNTER — Telehealth: Payer: Self-pay | Admitting: Cardiology

## 2020-02-23 ENCOUNTER — Other Ambulatory Visit (HOSPITAL_COMMUNITY)
Admission: RE | Admit: 2020-02-23 | Discharge: 2020-02-23 | Disposition: A | Discharge status: Home / Self Care | Payer: Medicare PPO | Source: Ambulatory Visit | Attending: Cardiology | Admitting: Cardiology

## 2020-02-23 DIAGNOSIS — I5032 Chronic diastolic (congestive) heart failure: Secondary | ICD-10-CM | POA: Diagnosis not present

## 2020-02-23 DIAGNOSIS — I4819 Other persistent atrial fibrillation: Secondary | ICD-10-CM | POA: Diagnosis not present

## 2020-02-23 DIAGNOSIS — Z79899 Other long term (current) drug therapy: Secondary | ICD-10-CM

## 2020-02-23 LAB — CBC
HCT: 36.3 % (ref 36.0–46.0)
Hemoglobin: 10.7 g/dL — ABNORMAL LOW (ref 12.0–15.0)
MCH: 24.3 pg — ABNORMAL LOW (ref 26.0–34.0)
MCHC: 29.5 g/dL — ABNORMAL LOW (ref 30.0–36.0)
MCV: 82.3 fL (ref 80.0–100.0)
Platelets: 376 10*3/uL (ref 150–400)
RBC: 4.41 MIL/uL (ref 3.87–5.11)
RDW: 17.2 % — ABNORMAL HIGH (ref 11.5–15.5)
WBC: 5.9 10*3/uL (ref 4.0–10.5)
nRBC: 0 % (ref 0.0–0.2)

## 2020-02-23 LAB — BASIC METABOLIC PANEL
Anion gap: 9 (ref 5–15)
BUN: 21 mg/dL (ref 8–23)
CO2: 27 mmol/L (ref 22–32)
Calcium: 9.1 mg/dL (ref 8.9–10.3)
Chloride: 99 mmol/L (ref 98–111)
Creatinine, Ser: 1.18 mg/dL — ABNORMAL HIGH (ref 0.44–1.00)
GFR, Estimated: 45 mL/min — ABNORMAL LOW (ref >60)
Glucose, Bld: 121 mg/dL — ABNORMAL HIGH (ref 70–99)
Potassium: 4.3 mmol/L (ref 3.5–5.1)
Sodium: 135 mmol/L (ref 135–145)

## 2020-02-23 LAB — BRAIN NATRIURETIC PEPTIDE: B Natriuretic Peptide: 996 pg/mL — ABNORMAL HIGH (ref 0.0–100.0)

## 2020-02-23 NOTE — Telephone Encounter (Signed)
Contacted patients daughter. Verbalized lab results. She was relieved to know that kidney function is at a normal level. Stated patients weght was 119 lb, but legs were still swollen with fluid. Will continue to follow Dr. Orson Gear orders concerning hydralazine, but would like a little more guidance.

## 2020-02-23 NOTE — Telephone Encounter (Signed)
Would probably continue the Lasix at 40 mg twice daily for the next few days until her weight is down to 116 pounds.  At that point switch to Lasix 60 mg daily.  Would plan to check a follow-up BMET in 10 days.

## 2020-02-23 NOTE — Telephone Encounter (Signed)
Please call pt's daughter [name] w/ results from lab work and not the patient.   [phone]

## 2020-02-29 ENCOUNTER — Telehealth: Payer: Self-pay | Admitting: Cardiology

## 2020-02-29 MED ORDER — METOLAZONE 2.5 MG PO TABS: 2.5000 mg | ORAL_TABLET | ORAL | 6 refills | Status: DC

## 2020-02-29 NOTE — Telephone Encounter (Signed)
Patient daughter wanted Dr. Diona Browner to know that her mom's weight last 3 days were 124.6, 123,and 124.  The goal was to get her down to 116 but weight is staying around 124.

## 2020-02-29 NOTE — Telephone Encounter (Signed)
Pt and daughter [name] notified and orders placed.

## 2020-02-29 NOTE — Telephone Encounter (Signed)
Let's add metolazone 2.5 mg twice weekly to current Lasix dose.

## 2020-03-01 DIAGNOSIS — M5416 Radiculopathy, lumbar region: Secondary | ICD-10-CM | POA: Diagnosis not present

## 2020-03-01 DIAGNOSIS — M48061 Spinal stenosis, lumbar region without neurogenic claudication: Secondary | ICD-10-CM | POA: Diagnosis not present

## 2020-03-01 DIAGNOSIS — M4316 Spondylolisthesis, lumbar region: Secondary | ICD-10-CM | POA: Diagnosis not present

## 2020-03-01 DIAGNOSIS — M47816 Spondylosis without myelopathy or radiculopathy, lumbar region: Secondary | ICD-10-CM | POA: Diagnosis not present

## 2020-03-01 DIAGNOSIS — M4126 Other idiopathic scoliosis, lumbar region: Secondary | ICD-10-CM | POA: Diagnosis not present

## 2020-03-02 ENCOUNTER — Telehealth: Payer: Self-pay | Admitting: Cardiology

## 2020-03-02 ENCOUNTER — Other Ambulatory Visit (HOSPITAL_COMMUNITY)
Admission: RE | Admit: 2020-03-02 | Discharge: 2020-03-02 | Disposition: A | Discharge status: Home / Self Care | Payer: Medicare PPO | Source: Ambulatory Visit | Attending: Cardiology | Admitting: Cardiology

## 2020-03-02 DIAGNOSIS — Z79899 Other long term (current) drug therapy: Secondary | ICD-10-CM | POA: Insufficient documentation

## 2020-03-02 LAB — BASIC METABOLIC PANEL
Anion gap: 12 (ref 5–15)
BUN: 30 mg/dL — ABNORMAL HIGH (ref 8–23)
CO2: 28 mmol/L (ref 22–32)
Calcium: 8.9 mg/dL (ref 8.9–10.3)
Chloride: 93 mmol/L — ABNORMAL LOW (ref 98–111)
Creatinine, Ser: 1.43 mg/dL — ABNORMAL HIGH (ref 0.44–1.00)
GFR, Estimated: 36 mL/min — ABNORMAL LOW (ref >60)
Glucose, Bld: 112 mg/dL — ABNORMAL HIGH (ref 70–99)
Potassium: 3.2 mmol/L — ABNORMAL LOW (ref 3.5–5.1)
Sodium: 133 mmol/L — ABNORMAL LOW (ref 135–145)

## 2020-03-02 MED ORDER — POTASSIUM CHLORIDE ER 10 MEQ PO TBCR: EXTENDED_RELEASE_TABLET | ORAL | 3 refills | Status: DC

## 2020-03-02 MED ORDER — FUROSEMIDE 40 MG PO TABS: 40.0000 mg | ORAL_TABLET | Freq: Two times a day (BID) | ORAL | 6 refills | Status: DC

## 2020-03-02 NOTE — Telephone Encounter (Signed)
Please call daughter robin when you get the lab results back from todays blood work.

## 2020-03-02 NOTE — Telephone Encounter (Signed)
Daughter returned call.She will give zaroxolyn 2.5 mg tomorrow and will increase potassium to 20 meq am and 10 meq pm. The lasix will remain at 40 mg BID.She will reassess her tomorrow and if she is still SOB, she will bring her to the ED.

## 2020-03-02 NOTE — Telephone Encounter (Signed)
Attempted to reach daughter, got voicemail, left message to return call.

## 2020-03-02 NOTE — Telephone Encounter (Addendum)
Jonelle Sidle, MD  03/02/2020 1:50 PM EST Back to Top     Results reviewed. Potassium down to 3.2, creatinine up to 1.43 - all the setting of increased diuretics. Increase KCl to 20 mEq daily. When weight back at baseline, try and cut Lasix back to 40 mg daily now that she is on metolazone twice weekly.     Daughter called to say her mother has been taking Lasix 40 mg twice a day since 02/21/20 because her weight has never returned to 116 lbs.Her weight is 124 lbs today. She had her first dose of Zaroxolyn yesterday and her weight was 124.8 lbs   She was told to take potassium 10 meq BID as her lasix dose was 40 mg BID.     Please advise.

## 2020-03-02 NOTE — Telephone Encounter (Addendum)
Thank you for clarifying that.  Lasix is at 40 mg twice daily and KCl 10 mEq twice daily.  One dose of Zaroxolyn not yet effective.  I am concerned that outpatient therapy may not result in our goal and she may need to be admitted for IV diuresis.  In the short-term, I would suggest increasing KCl to 20 mEq in the morning and 10 mEq in the afternoon.  Continue to track weight and see if there is response after the second dose of metolazone which I would go ahead and give tomorrow.  If she remains symptomatic, particularly with shortness of breath or persistent leg swelling, may be best to be evaluated in the ER in case she needs to be admitted for IV diuresis and electrolyte repletion.  In that case, renal function could be tracked closely as adjustments were made in diuretics.  I was hopeful that we could keep her out of the hospital however.

## 2020-03-02 NOTE — Addendum Note (Signed)
Addended by: Marlyn Corporal A on: 03/02/2020 05:01 PM   Modules accepted: Orders

## 2020-03-09 ENCOUNTER — Encounter: Payer: Self-pay | Admitting: Cardiology

## 2020-03-09 ENCOUNTER — Ambulatory Visit (INDEPENDENT_AMBULATORY_CARE_PROVIDER_SITE_OTHER): Payer: Medicare PPO | Admitting: Cardiology

## 2020-03-09 ENCOUNTER — Other Ambulatory Visit: Payer: Self-pay

## 2020-03-09 VITALS — BP 122/86 | HR 67 | Ht 63.5 in | Wt 119.0 lb

## 2020-03-09 DIAGNOSIS — I5032 Chronic diastolic (congestive) heart failure: Secondary | ICD-10-CM

## 2020-03-09 DIAGNOSIS — I4819 Other persistent atrial fibrillation: Secondary | ICD-10-CM | POA: Diagnosis not present

## 2020-03-09 MED ORDER — FUROSEMIDE 40 MG PO TABS: 60.0000 mg | ORAL_TABLET | Freq: Every day | ORAL | 3 refills | Status: DC

## 2020-03-09 MED ORDER — HYDRALAZINE HCL 25 MG PO TABS: 25.0000 mg | ORAL_TABLET | Freq: Three times a day (TID) | ORAL | 3 refills | Status: DC

## 2020-03-09 MED ORDER — LOSARTAN POTASSIUM 50 MG PO TABS: 50.0000 mg | ORAL_TABLET | Freq: Every day | ORAL | 3 refills | Status: DC

## 2020-03-09 NOTE — Progress Notes (Signed)
Cardiology Office Note  Date: 03/09/2020   ID: Jadaya, [place] [birth year], MRN [id number]  PCP:  Kathyrn Drown, MD  Cardiologist:  Rozann Lesches, MD Electrophysiologist:  None   Chief Complaint  Patient presents with  . Cardiac follow-up    History of Present Illness: Katherine Lane is an 87 y.o. female last seen in early January by Mr. Reino Bellis.  I reviewed his note and subsequent telephone encounters with discussion of weights and medication adjustments.  She is here today with her daughter for a follow-up visit.  Fluid status has improved with recent adjustments in medication.  Metolazone seemed to be the most effective in addition to her Lasix dosing.  She did get down to baseline of 116 pounds, recently increased to 118 pounds by her home scale.  We went over her medications in detail and discussed adjustments which are outlined below.  Most recent lab work from January 14 revealed potassium 3.2, BUN 30, creatinine 1.43 up from 1.18.  Past Medical History:  Diagnosis Date  . Anxiety   . Arthritis   . Atrial fibrillation ([place])   . Chronic back pain   . Constipation   . Depression   . Diastolic heart failure ([place])   . Essential hypertension   . History of blood transfusion   . History of cervical fracture   . Insomnia   . Migraine headache   . Osteoporosis   . Trigeminal neuralgia   . 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 ANKLE FRACTURE SURGERY Right   . CARDIAC CATHETERIZATION     denies  . CATARACT EXTRACTION W/PHACO Right 07/22/2012   Procedure: CATARACT EXTRACTION PHACO AND INTRAOCULAR LENS PLACEMENT (IOC);  Surgeon: Tonny Branch, MD;  Location: [facility];  Service: Ophthalmology;  Laterality: Right;  CDE:  18.93  . CATARACT EXTRACTION W/PHACO Left 08/09/2012   Procedure: CATARACT EXTRACTION PHACO AND INTRAOCULAR LENS PLACEMENT (IOC);  Surgeon: Tonny Branch, MD;  Location: [facility];  Service:  Ophthalmology;  Laterality: Left;  CDE: 17.21  . COLONOSCOPY    . FLEXIBLE SIGMOIDOSCOPY N/A 11/19/2018   Procedure: FLEXIBLE SIGMOIDOSCOPY;  Surgeon: Danie Binder, MD;  Location: [facility];  Service: Endoscopy;  Laterality: N/A;  7:30am  . HARDWARE REMOVAL  07/18/2011   Procedure: HARDWARE REMOVAL; Right leg-  Surgeon: Carole Civil, MD;  Location: [facility];  Service: Orthopedics;  Laterality: Right;  . HEMORRHOID BANDING N/A 11/19/2018   Procedure: HEMORRHOID BANDING;  Surgeon: Danie Binder, MD;  [facility];  Service: Endoscopy;  Laterality: N/A;  . KYPHOPLASTY N/A 12/26/2016   Procedure: Lumbar two Lumbar three and Lumbar five KYPHOPLASTY;  Surgeon: Erline Levine, MD;  Location: [facility];  Service: Neurosurgery;  Laterality: N/A;  . KYPHOPLASTY N/A 02/19/2017   Procedure: KYPHOPLASTY LUMBAR ONE;  Surgeon: Erline Levine, MD;  Location: [place];  Service: Neurosurgery;  Laterality: N/A;  KYPHOPLASTY LUMBAR ONE  . NERVE REPAIR  07/18/2011   Procedure: NERVE REPAIR;  Surgeon: Carole Civil, MD;  Location: [facility];  Service: Orthopedics;  Laterality: Right;  Right leg superficial peroneal nerve release    . PARTIAL HYSTERECTOMY      Current Outpatient Medications  Medication Sig Dispense Refill  . diltiazem (CARDIZEM CD) 120 MG 24 hr capsule TAKE (1) CAPSULE BY MOUTH ONCE DAILY. 90 capsule 3  . fluticasone (FLONASE) 50 MCG/ACT nasal spray Place 2 sprays into both nostrils  daily. 16 g 5  . furosemide (LASIX) 40 MG tablet Take 1.5 tablets (60 mg total) by mouth daily. 135 tablet 3  . hydrALAZINE (APRESOLINE) 25 MG tablet Take 1 tablet (25 mg total) by mouth 3 (three) times daily. 270 tablet 3  . hydrocortisone (ANUSOL-HC) 2.5 % rectal cream APPLY RECTALLY THREE TIMES DAILY AS DIRECTED (Patient taking differently: Place 1 application rectally 2 (two) times daily as needed for hemorrhoids or anal itching.) 30 g 5  . HYDROmorphone (DILAUDID) 2 MG tablet Take 2 mg by mouth every 4  (four) hours as needed.    . Lidocaine-Hydrocortisone Ace 3-2.5 % KIT APPLY TO RECTUM QID AS NEEDED FOR RECTAL PAIN OR BLEEDING (Patient taking differently: Place 1 application rectally daily as needed (rectal pain or bleeding).) 1 kit 1  . LORazepam (ATIVAN) 0.5 MG tablet Take 1 tablet (0.5 mg total) by mouth 2 (two) times daily as needed for anxiety or sleep. TAKE ONE TABLET BY MOUTH UP TO TWICE DAILY AS NEEDED. 60 tablet 5  . losartan (COZAAR) 50 MG tablet Take 1 tablet (50 mg total) by mouth daily. 90 tablet 3  . Magnesium 250 MG TABS Take 250 mg by mouth at bedtime.     . metolazone (ZAROXOLYN) 2.5 MG tablet Take 1 tablet (2.5 mg total) by mouth 2 (two) times a week. 10 tablet 6  . metoprolol succinate (TOPROL-XL) 100 MG 24 hr tablet TAKE ONE TABLET BY MOUTH ONCE DAILY. 30 tablet 2  . Multiple Vitamin (MULTIVITAMIN WITH MINERALS) TABS tablet Take 1 tablet by mouth daily. Centrum Silver    . Polyethyl Glycol-Propyl Glycol (LUBRICANT EYE DROPS) 0.4-0.3 % SOLN Place 1 drop into both eyes at bedtime.    . polyethylene glycol powder (GLYCOLAX/MIRALAX) 17 GM/SCOOP powder Take 17 g daily as needed by mouth for moderate constipation.     . potassium chloride (KLOR-CON) 10 MEQ tablet Take 20 meq am and 10 mg pm 90 tablet 3  . rivaroxaban (XARELTO) 20 MG TABS tablet TAKE 1 TABLET DAILY WITH SUPPER. 90 tablet 1  . shark liver oil-cocoa butter (PREPARATION H) 0.25-3-85.5 % suppository Place 1 suppository rectally 2 (two) times daily as needed for hemorrhoids.     Marland Kitchen zolpidem (AMBIEN) 5 MG tablet TAKE (1) TABLET BY MOUTH AT BEDTIME FOR SLEEP. 30 tablet 5   No current facility-administered medications for this visit.   Allergies:  Amlodipine, Codeine, Other, and Tramadol   ROS: Back pain, using a cane.  Physical Exam: VS:  BP 122/86   Pulse 67   Ht 5' 3.5" (1.613 m)   Wt 119 lb (54 kg)   SpO2 98%   BMI 20.75 kg/m , BMI Body mass index is 20.75 kg/m.  Wt Readings from Last 3 Encounters:   03/09/20 119 lb (54 kg)  02/21/20 125 lb (56.7 kg)  01/02/20 119 lb (54 kg)    General: Elderly woman, no distress. HEENT: Conjunctiva and lids normal, wearing a mask. Neck: Supple, no elevated JVP or carotid bruits, no thyromegaly. Lungs: Clear to auscultation, nonlabored breathing at rest. Cardiac: Irregularly irregular, no S3, soft systolic murmur, no pericardial rub. Extremities: No pitting edema.  ECG:  An ECG dated 09/28/2019 was personally reviewed today and demonstrated:  Atrial fibrillation with poor R wave progression, rule out old anterior infarct pattern.  Recent Labwork: 09/27/2019: Magnesium 2.0 12/14/2019: ALT 15; AST 28 02/23/2020: B Natriuretic Peptide 996.0; Hemoglobin 10.7; Platelets 376 03/02/2020: BUN 30; Creatinine, Ser 1.43; Potassium 3.2; Sodium 133  Component Value Date/Time   CHOL 177 09/24/2015 0837   TRIG 71 11/29/2018 0438   HDL 57 09/24/2015 0837   CHOLHDL 3.1 09/24/2015 0837   CHOLHDL 3.1 11/07/2013 0807   VLDL 26 11/07/2013 0807   LDLCALC 93 09/24/2015 0837    Other Studies Reviewed Today:  Echocardiogram 09/28/2019: 1. Left ventricular ejection fraction, by estimation, is 60 to 65%. The  left ventricle has normal function. The left ventricle has no regional  wall motion abnormalities. There is moderate left ventricular hypertrophy.  Left ventricular diastolic  parameters are consistent with Grade II diastolic dysfunction  (pseudonormalization). Elevated left atrial pressure.  2. Right ventricular systolic function is mildly reduced. The right  ventricular size is moderately enlarged.  3. Left atrial size was severely dilated.  4. Right atrial size was severely dilated.  5. The mitral valve is normal in structure. No evidence of mitral valve  regurgitation. No evidence of mitral stenosis.  6. The aortic valve is tricuspid. Aortic valve regurgitation is not  visualized. No aortic stenosis is present.  7. Severe pulmonary HTN, PASP is  45 mmHg.  8. The inferior vena cava is dilated in size with <50% respiratory  variability, suggesting right atrial pressure of 15 mmHg.   Assessment and Plan:  1. Recent acute on chronic diastolic heart failure.  Fluid status has improved with medication adjustments.  Plan at this time is to change Lasix to 60 mg in the morning, continue KCl total of 30 mEq daily, use metolazone 2.5 mg tablets only for weight increase of 2 to 3 pounds in 24 hours as needed (generally no more than 2 or 3 days in a row).  Also concurrently reducing Cozaar to 50 mg in the morning and continue hydralazine at previously reduced dose 25 mg 3 times daily.  Continue to track weight and blood pressure.  Follow-up BMET in 7 to 10 days.  Clinical reassessment in the next month.  2. Persistent atrial fibrillation, continue anticoagulation with Xarelto for stroke prophylaxis.  Heart rate control is adequate patient of Toprol-XL and Cardizem CD.  3. Acute renal insufficiency diuresis, recent creatinine 1.43.  Plan to recheck BMET as noted above.   Medication Adjustments/Labs and Tests Ordered: Current medicines are reviewed at length with the patient today.  Concerns regarding medicines are outlined above.   Tests Ordered: Orders Placed This Encounter  Procedures  . Basic metabolic panel    Medication Changes: Meds ordered this encounter  Medications  . furosemide (LASIX) 40 MG tablet    Sig: Take 1.5 tablets (60 mg total) by mouth daily.    Dispense:  135 tablet    Refill:  3    03/09/20 changed to 60 mg qd  . hydrALAZINE (APRESOLINE) 25 MG tablet    Sig: Take 1 tablet (25 mg total) by mouth 3 (three) times daily.    Dispense:  270 tablet    Refill:  3    03/09/20 decreased to 25 mg TID  . losartan (COZAAR) 50 MG tablet    Sig: Take 1 tablet (50 mg total) by mouth daily.    Dispense:  90 tablet    Refill:  3    Disposition:  Follow up 1 month in the [place] office.  Signed, Satira Sark, MD,  [facility] 03/09/2020 3:57 PM    [place] [facility] [address], [place], [place] [address] Phone: [phone]; Fax: [phone]

## 2020-03-09 NOTE — Patient Instructions (Signed)
Medication Instructions:  DECREASE Losartan to 50 mg daily   Take Lasix 60 mg daily  (1 1/2 tablets)   DECREASE Hydralazine to 25 mg three times a day   Take Metolazone to 2.5 mg daily for 2-3 days if weight gain is over 3 lbs in 24 hours    *If you need a refill on your cardiac medications before your next appointment, please call your pharmacy*   Lab Work: BMET in 10 days    If you have labs (blood work) drawn today and your tests are completely normal, you will receive your results only by:  MyChart Message (if you have MyChart) OR  A paper copy in the mail If you have any lab test that is abnormal or we need to change your treatment, we will call you to review the results.   Testing/Procedures: None today   Follow-Up: At [facility], you and your health needs are our priority.  As part of our continuing mission to provide you with exceptional heart care, we have created designated Provider Care Teams.  These Care Teams include your primary Cardiologist (physician) and Advanced Practice Providers (APPs -  Physician Assistants and Nurse Practitioners) who all work together to provide you with the care you need, when you need it.  We recommend signing up for the patient portal called "MyChart".  Sign up information is provided on this After Visit Summary.  MyChart is used to connect with patients for Virtual Visits (Telemedicine).  Patients are able to view lab/test results, encounter notes, upcoming appointments, etc.  Non-urgent messages can be sent to your provider as well.   To learn more about what you can do with MyChart, go to [email or web address].    Your next appointment:   3-4 weeks  week(s)  The format for your next appointment:   In Person  Provider:   Randall An, PA-C   Other Instructions None      Thank you for choosing [facility] !

## 2020-03-12 ENCOUNTER — Telehealth: Payer: Self-pay | Admitting: Internal Medicine

## 2020-03-12 ENCOUNTER — Telehealth: Payer: Self-pay

## 2020-03-12 NOTE — Telephone Encounter (Signed)
FYI: Hey I see where you had already done this.

## 2020-03-12 NOTE — Telephone Encounter (Signed)
Patient daughter called and said she needs her prescription for her cream sent to [facility]     The pharmacy sent [place] a request for a refill Friday

## 2020-03-12 NOTE — Telephone Encounter (Signed)
Received refill request from [facility] for Apothecary hemorrhoid cream (no Hydrocortisone) apply to rectum 4 times daily as needed for pain or bleeding.

## 2020-03-12 NOTE — Telephone Encounter (Signed)
Telephone note has already been sent to [facility] refill pool.

## 2020-03-13 ENCOUNTER — Telehealth: Payer: Self-pay | Admitting: Internal Medicine

## 2020-03-13 ENCOUNTER — Other Ambulatory Visit: Payer: Self-pay | Admitting: Gastroenterology

## 2020-03-13 NOTE — Telephone Encounter (Signed)
Rx has been phoned in to [place]. Daughter is aware that they should have medication ready today.

## 2020-03-13 NOTE — Telephone Encounter (Signed)
Pt's daughter called to follow up on the cream prescription that needs to be sent to [facility]. Wynne Dust, NP is assigned for refills this week, but out of the office. Please advise so patient can get her refill.

## 2020-03-13 NOTE — Telephone Encounter (Signed)
Noted. Sent previous message to provider at the hospital Ermalinda Memos, [place].

## 2020-03-13 NOTE — Telephone Encounter (Signed)
Noted  

## 2020-03-13 NOTE — Progress Notes (Signed)
Called [facility] to refill compounded hemorrhoid cream.

## 2020-03-13 NOTE — Telephone Encounter (Signed)
Routing message to Ermalinda Memos, [place] in the absence of Wynne Dust, NP.

## 2020-03-19 ENCOUNTER — Other Ambulatory Visit (HOSPITAL_COMMUNITY)
Admission: RE | Admit: 2020-03-19 | Discharge: 2020-03-19 | Disposition: A | Discharge status: Home / Self Care | Payer: Medicare PPO | Source: Ambulatory Visit | Attending: Cardiology | Admitting: Cardiology

## 2020-03-19 DIAGNOSIS — Z79899 Other long term (current) drug therapy: Secondary | ICD-10-CM | POA: Insufficient documentation

## 2020-03-19 DIAGNOSIS — I5032 Chronic diastolic (congestive) heart failure: Secondary | ICD-10-CM | POA: Diagnosis present

## 2020-03-19 LAB — BASIC METABOLIC PANEL
Anion gap: 11 (ref 5–15)
BUN: 23 mg/dL (ref 8–23)
CO2: 24 mmol/L (ref 22–32)
Calcium: 9 mg/dL (ref 8.9–10.3)
Chloride: 98 mmol/L (ref 98–111)
Creatinine, Ser: 1.25 mg/dL — ABNORMAL HIGH (ref 0.44–1.00)
GFR, Estimated: 42 mL/min — ABNORMAL LOW (ref >60)
Glucose, Bld: 150 mg/dL — ABNORMAL HIGH (ref 70–99)
Potassium: 4.6 mmol/L (ref 3.5–5.1)
Sodium: 133 mmol/L — ABNORMAL LOW (ref 135–145)

## 2020-03-29 ENCOUNTER — Other Ambulatory Visit: Payer: Self-pay | Admitting: Cardiology

## 2020-04-06 ENCOUNTER — Ambulatory Visit: Payer: Medicare PPO | Admitting: Student

## 2020-04-11 DIAGNOSIS — M5416 Radiculopathy, lumbar region: Secondary | ICD-10-CM | POA: Diagnosis not present

## 2020-04-11 DIAGNOSIS — M546 Pain in thoracic spine: Secondary | ICD-10-CM | POA: Diagnosis not present

## 2020-04-11 DIAGNOSIS — S22080A Wedge compression fracture of T11-T12 vertebra, initial encounter for closed fracture: Secondary | ICD-10-CM | POA: Diagnosis not present

## 2020-04-11 DIAGNOSIS — M545 Low back pain, unspecified: Secondary | ICD-10-CM | POA: Diagnosis not present

## 2020-04-12 ENCOUNTER — Ambulatory Visit: Payer: Medicare PPO | Admitting: Student

## 2020-04-12 ENCOUNTER — Other Ambulatory Visit: Payer: Self-pay

## 2020-04-12 ENCOUNTER — Other Ambulatory Visit: Payer: Self-pay | Admitting: Neurosurgery

## 2020-04-12 ENCOUNTER — Encounter: Payer: Self-pay | Admitting: Student

## 2020-04-12 VITALS — BP 138/72 | HR 63 | Ht 63.0 in | Wt 118.0 lb

## 2020-04-12 DIAGNOSIS — I10 Essential (primary) hypertension: Secondary | ICD-10-CM

## 2020-04-12 DIAGNOSIS — M545 Low back pain, unspecified: Secondary | ICD-10-CM

## 2020-04-12 DIAGNOSIS — S22080A Wedge compression fracture of T11-T12 vertebra, initial encounter for closed fracture: Secondary | ICD-10-CM

## 2020-04-12 DIAGNOSIS — I4819 Other persistent atrial fibrillation: Secondary | ICD-10-CM

## 2020-04-12 DIAGNOSIS — I5032 Chronic diastolic (congestive) heart failure: Secondary | ICD-10-CM

## 2020-04-12 MED ORDER — METOLAZONE 2.5 MG PO TABS: 2.5000 mg | ORAL_TABLET | ORAL | 6 refills | Status: DC | PRN

## 2020-04-12 NOTE — Progress Notes (Signed)
Cardiology Office Note    Date:  04/12/2020   ID:  Calen, Posch Jan 18, 1933, MRN [id number]  PCP:  Kathyrn Drown, MD  Cardiologist: Rozann Lesches, MD    Chief Complaint  Patient presents with  . Follow-up    4 week visit    History of Present Illness:    Katherine Lane is a 87 y.o. female with past medical history of chronic diastolic CHF, permanent atrial fibrillation, HTN, and chronic back pain who presents to the office today for 4 week follow-up.   She was last examined by Dr. Domenic Polite in 02/2020 and had recently experienced issues with fluid accumulation leading to initiation of Metolazone 2.8m twice weekly. Weight was at 119 lbs on the office scales and she reported a baseline weight of 116 lbs. Lasix was reduced to 620mdaily and she was informed to only use Metolazone 2.20m1mn a PRN basis. Losartan was also reduced to 84m31mily given soft BP. Follow-up labs on 1/31 showed creatinine had improved from 1.43 to 1.25 with K+ at 4.6 and Na+ stable at 133.  In talking with the patient and her son-in-law today, she reports having significant back pain currently and was recently diagnosed with recurrent spinal fractures and is scheduled for an MRI this coming weekend. In the setting of progressive back pain, she does report having shortness of breath when her pain is most intense. She denies any associated chest pain. Does report occasional palpitations but is unsure what her heart rate has been at home when this occurs. No reported chest pain, orthopnea or PND. She does have occasional lower extremity edema but reports this has improved with the decrease in her weight.  Past Medical History:  Diagnosis Date  . Anxiety   . Arthritis   . Atrial fibrillation (HCC)[place]. Chronic back pain   . Constipation   . Depression   . Diastolic heart failure (HCC)[place]. Essential hypertension   . History of blood transfusion   . History of cervical fracture   . Insomnia   . Migraine  headache   . Osteoporosis   . Trigeminal neuralgia   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  Past Surgical History:  Procedure Laterality Date  . ANKLE FRACTURE SURGERY Right   . CARDIAC CATHETERIZATION     denies  . CATARACT EXTRACTION W/PHACO Right 07/22/2012   Procedure: CATARACT EXTRACTION PHACO AND INTRAOCULAR LENS PLACEMENT (IOC);  Surgeon: KerrTonny Branch;  Location: [facility];  Service: Ophthalmology;  Laterality: Right;  CDE:  18.93  . CATARACT EXTRACTION W/PHACO Left 08/09/2012   Procedure: CATARACT EXTRACTION PHACO AND INTRAOCULAR LENS PLACEMENT (IOC);  Surgeon: KerrTonny Branch;  Location: [facility];  Service: Ophthalmology;  Laterality: Left;  CDE: 17.21  . COLONOSCOPY    . FLEXIBLE SIGMOIDOSCOPY N/A 11/19/2018   Procedure: FLEXIBLE SIGMOIDOSCOPY;  Surgeon: FielDanie Binder;  Location: [facility];  Service: Endoscopy;  Laterality: N/A;  7:30am  . HARDWARE REMOVAL  07/18/2011   Procedure: HARDWARE REMOVAL; Right leg-  Surgeon: StanCarole Civil;  Location: [facility];  Service: Orthopedics;  Laterality: Right;  . HEMORRHOID BANDING N/A 11/19/2018   Procedure: HEMORRHOID BANDING;  Surgeon: FielDanie Binder;  Location: [facility];  Service: Endoscopy;  Laterality: N/A;  . KYPHOPLASTY N/A 12/26/2016   Procedure: Lumbar two Lumbar three and Lumbar five KYPHOPLASTY;  Surgeon: SterErline Levine;  [facility]surgery;  Laterality: N/A;  . KYPHOPLASTY N/A 02/19/2017   Procedure: KYPHOPLASTY LUMBAR ONE;  Surgeon: Erline Levine, MD;  Location: [facility];  Service: Neurosurgery;  Laterality: N/A;  KYPHOPLASTY LUMBAR ONE  . NERVE REPAIR  07/18/2011   Procedure: NERVE REPAIR;  Surgeon: Carole Civil, MD;  Location: [facility];  Service: Orthopedics;  Laterality: Right;  Right leg superficial peroneal nerve release    . PARTIAL HYSTERECTOMY      Current Medications: Outpatient Medications Prior to Visit  Medication Sig Dispense Refill  . diltiazem (CARDIZEM CD) 120 MG 24 hr  capsule TAKE (1) CAPSULE BY MOUTH ONCE DAILY. 90 capsule 3  . fluticasone (FLONASE) 50 MCG/ACT nasal spray Place 2 sprays into both nostrils daily. 16 g 5  . furosemide (LASIX) 40 MG tablet Take 1.5 tablets (60 mg total) by mouth daily. 135 tablet 3  . hydrALAZINE (APRESOLINE) 25 MG tablet Take 1 tablet (25 mg total) by mouth 3 (three) times daily. 270 tablet 3  . hydrocortisone (ANUSOL-HC) 2.5 % rectal cream APPLY RECTALLY THREE TIMES DAILY AS DIRECTED (Patient taking differently: Place 1 application rectally 2 (two) times daily as needed for hemorrhoids or anal itching.) 30 g 5  . HYDROmorphone (DILAUDID) 2 MG tablet Take 2 mg by mouth every 4 (four) hours as needed.    . Lidocaine-Hydrocortisone Ace 3-2.5 % KIT APPLY TO RECTUM QID AS NEEDED FOR RECTAL PAIN OR BLEEDING (Patient taking differently: Place 1 application rectally daily as needed (rectal pain or bleeding).) 1 kit 1  . LORazepam (ATIVAN) 0.5 MG tablet Take 1 tablet (0.5 mg total) by mouth 2 (two) times daily as needed for anxiety or sleep. TAKE ONE TABLET BY MOUTH UP TO TWICE DAILY AS NEEDED. 60 tablet 5  . losartan (COZAAR) 50 MG tablet Take 1 tablet (50 mg total) by mouth daily. 90 tablet 3  . Magnesium 250 MG TABS Take 250 mg by mouth at bedtime.     . metoprolol succinate (TOPROL-XL) 100 MG 24 hr tablet TAKE ONE TABLET BY MOUTH ONCE DAILY. 90 tablet 3  . Multiple Vitamin (MULTIVITAMIN WITH MINERALS) TABS tablet Take 1 tablet by mouth daily. Centrum Silver    . Polyethyl Glycol-Propyl Glycol (LUBRICANT EYE DROPS) 0.4-0.3 % SOLN Place 1 drop into both eyes at bedtime.    . polyethylene glycol powder (GLYCOLAX/MIRALAX) 17 GM/SCOOP powder Take 17 g daily as needed by mouth for moderate constipation.     . potassium chloride (KLOR-CON) 10 MEQ tablet Take 20 meq am and 10 mg pm 90 tablet 3  . rivaroxaban (XARELTO) 20 MG TABS tablet TAKE 1 TABLET DAILY WITH SUPPER. 90 tablet 1  . shark liver oil-cocoa butter (PREPARATION H) 0.25-3-85.5 %  suppository Place 1 suppository rectally 2 (two) times daily as needed for hemorrhoids.     Marland Kitchen zolpidem (AMBIEN) 5 MG tablet TAKE (1) TABLET BY MOUTH AT BEDTIME FOR SLEEP. 30 tablet 5  . metolazone (ZAROXOLYN) 2.5 MG tablet Take 1 tablet (2.5 mg total) by mouth 2 (two) times a week. 10 tablet 6   No facility-administered medications prior to visit.     Allergies:   Amlodipine, Codeine, Other, and Tramadol   Social History   Socioeconomic History  . Marital status: Widowed    Spouse name: Not on file  . Number of children: 3  . Years of education: Not on file  . Highest education level: Not on file  Occupational History  . Occupation: retired    Fish farm manager: RETIRED  Tobacco Use  .  Smoking status: Never Smoker  . Smokeless tobacco: Never Used  Vaping Use  . Vaping Use: Never used  Substance and Sexual Activity  . Alcohol use: No    Alcohol/week: 0.0 standard drinks  . Drug use: No  . Sexual activity: Not Currently    Birth control/protection: Surgical    Comment: hyst  Other Topics Concern  . Not on file  Social History Narrative  . Not on file   Social Determinants of Health   Financial Resource Strain: Not on file  Food Insecurity: Not on file  Transportation Needs: Not on file  Physical Activity: Not on file  Stress: Not on file  Social Connections: Not on file     Family History:  The patient's family history includes Alzheimer's disease in her mother; Cancer in her mother; Diabetes in her brother and daughter; Hypertension in her father and mother; Other in her brother and father.   Review of Systems:   Please see the history of present illness.     General:  No chills, fever, night sweats or weight changes. Positive for back pain.  Cardiovascular:  No chest pain, dyspnea on exertion, orthopnea, palpitations, paroxysmal nocturnal dyspnea. Positive for lower extremity edema.  Dermatological: No rash, lesions/masses Respiratory: No cough, dyspnea Urologic: No  hematuria, dysuria Abdominal:   No nausea, vomiting, diarrhea, bright red blood per rectum, melena, or hematemesis Neurologic:  No visual changes, wkns, changes in mental status. All other systems reviewed and are otherwise negative except as noted above.   Physical Exam:    VS:  BP 138/72   Pulse 63   Ht '5\' 3"'  (1.6 m)   Wt 118 lb (53.5 kg)   SpO2 100%   BMI 20.90 kg/m    General: Well developed, elderly female appearing in no acute distress. Head: Normocephalic, atraumatic. Neck: No carotid bruits. JVD not elevated.  Lungs: Respirations regular and unlabored, without wheezes or rales.  Heart: Irregularly irregular. No S3 or S4.  No murmur, no rubs, or gallops appreciated. Abdomen: Appears non-distended. No obvious abdominal masses. Msk:  Strength and tone appear normal for age. No obvious joint deformities or effusions. Back brace in place.  Extremities: No clubbing or cyanosis. Trace lower extremity edema bilaterally.  Distal pedal pulses are 2+ bilaterally. Neuro: Alert and oriented X 3. Moves all extremities spontaneously. No focal deficits noted. Psych:  Responds to questions appropriately with a normal affect. Skin: No rashes or lesions noted  Wt Readings from Last 3 Encounters:  04/12/20 118 lb (53.5 kg)  03/09/20 119 lb (54 kg)  02/21/20 125 lb (56.7 kg)    Studies/Labs Reviewed:   EKG:  EKG is not ordered today.     Recent Labs: 09/27/2019: Magnesium 2.0 12/14/2019: ALT 15 02/23/2020: B Natriuretic Peptide 996.0; Hemoglobin 10.7; Platelets 376 03/19/2020: BUN 23; Creatinine, Ser 1.25; Potassium 4.6; Sodium 133   Lipid Panel    Component Value Date/Time   CHOL 177 09/24/2015 0837   TRIG 71 11/29/2018 0438   HDL 57 09/24/2015 0837   CHOLHDL 3.1 09/24/2015 0837   CHOLHDL 3.1 11/07/2013 0807   VLDL 26 11/07/2013 0807   LDLCALC 93 09/24/2015 0837    Additional studies/ records that were reviewed today include:   Echocardiogram: 09/2019 IMPRESSIONS    1.  Left ventricular ejection fraction, by estimation, is 60 to 65%. The  left ventricle has normal function. The left ventricle has no regional  wall motion abnormalities. There is moderate left ventricular hypertrophy.  Left ventricular  diastolic  parameters are consistent with Grade II diastolic dysfunction  (pseudonormalization). Elevated left atrial pressure.  2. Right ventricular systolic function is mildly reduced. The right  ventricular size is moderately enlarged.  3. Left atrial size was severely dilated.  4. Right atrial size was severely dilated.  5. The mitral valve is normal in structure. No evidence of mitral valve  regurgitation. No evidence of mitral stenosis.  6. The aortic valve is tricuspid. Aortic valve regurgitation is not  visualized. No aortic stenosis is present.  7. Severe pulmonary HTN, PASP is 45 mmHg.  8. The inferior vena cava is dilated in size with <50% respiratory  variability, suggesting right atrial pressure of 15 mmHg.   Assessment:    1. Chronic diastolic heart failure (HCC)   2. Persistent atrial fibrillation ([place])   3. Essential hypertension      Plan:   In order of problems listed above:  1. Chronic Diastolic CHF - Her weight has been stable on her home scales and breathing has been at baseline. She does have trace edema on examination today. Given the stability in her weight, will continue Lasix 60 mg daily along with Metolazone as needed for weight gain greater than 2 pounds overnight or 5 pounds in 1 week. Reviewed the importance of elevating her lower extremities as able and limiting sodium intake to help with her lower extremity edema.  2.  Persistent Atrial Fibrillation - She does report occasional palpitations at home and has a pulse oximeter but does not check this frequently. We will provide her with a HR/BP log today and have her keep track of her readings over the next several weeks. Given her baseline heart rate in the 60's  today, I would not plan to further titrate her AV nodal blocking agents unless she is having tachycardia. Continue current regimen for now with Cardizem CD 120 mg daily and Toprol-XL 100 mg daily. - She remains on Xarelto for anticoagulation and denies any evidence of active bleeding. She is scheduled for an MRI of her spine and says she will likely require surgical intervention but is unsure when this would be performed. Will preemptively review with the anticoagulation clinic but anticipate holding Xarelto for 48 hours prior to her surgery once scheduled.  3.  HTN - BP is well controlled at 138/72 during today's visit. Continue current medication regimen with Cardizem CD 120 mg daily, Hydralazine 25 mg 3 times daily, Losartan 50 mg daily and Toprol-XL 100 mg daily.   Medication Adjustments/Labs and Tests Ordered: Current medicines are reviewed at length with the patient today.  Concerns regarding medicines are outlined above.  Medication changes, Labs and Tests ordered today are listed in the Patient Instructions below. Patient Instructions  Medication Instructions:  Take Metolazone as needed HOLD Xarelto 48 hours prior to your procedure  Your physician recommends that you continue on your current medications as directed. Please refer to the Current Medication list given to you today.  *If you need a refill on your cardiac medications before your next appointment, please call your pharmacy*   Lab Work: None If you have labs (blood work) drawn today and your tests are completely normal, you will receive your results only by: Marland Kitchen MyChart Message (if you have MyChart) OR . A paper copy in the mail If you have any lab test that is abnormal or we need to change your treatment, we will call you to review the results.   Testing/Procedures: None   Follow-Up: At [facility], you and your health needs are our priority.  As part of our continuing mission to provide you with exceptional heart  care, we have created designated Provider Care Teams.  These Care Teams include your primary Cardiologist (physician) and Advanced Practice Providers (APPs -  Physician Assistants and Nurse Practitioners) who all work together to provide you with the care you need, when you need it.  We recommend signing up for the patient portal called "MyChart".  Sign up information is provided on this After Visit Summary.  MyChart is used to connect with patients for Virtual Visits (Telemedicine).  Patients are able to view lab/test results, encounter notes, upcoming appointments, etc.  Non-urgent messages can be sent to your provider as well.   To learn more about what you can do with MyChart, go to [email or web address].    Your next appointment:   3 month(s)  The format for your next appointment:   In Person  Provider:   You may see Rozann Lesches, MD or one of the following Advanced Practice Providers on your designated Care Team:    Bernerd Pho, PA-C     Other Instructions Please keep a bp log for 3-4 weeks and update our office of the results.         Signed, Erma Heritage, PA-C  04/12/2020 5:46 PM    [facility] [address] [place], [place] [address] Phone: [phone] Fax: [phone]

## 2020-04-12 NOTE — Patient Instructions (Signed)
Medication Instructions:  Take Metolazone as needed HOLD Xarelto 48 hours prior to your procedure  Your physician recommends that you continue on your current medications as directed. Please refer to the Current Medication list given to you today.  *If you need a refill on your cardiac medications before your next appointment, please call your pharmacy*   Lab Work: None If you have labs (blood work) drawn today and your tests are completely normal, you will receive your results only by: Marland Kitchen MyChart Message (if you have MyChart) OR . A paper copy in the mail If you have any lab test that is abnormal or we need to change your treatment, we will call you to review the results.   Testing/Procedures: None   Follow-Up: At [facility], you and your health needs are our priority.  As part of our continuing mission to provide you with exceptional heart care, we have created designated Provider Care Teams.  These Care Teams include your primary Cardiologist (physician) and Advanced Practice Providers (APPs -  Physician Assistants and Nurse Practitioners) who all work together to provide you with the care you need, when you need it.  We recommend signing up for the patient portal called "MyChart".  Sign up information is provided on this After Visit Summary.  MyChart is used to connect with patients for Virtual Visits (Telemedicine).  Patients are able to view lab/test results, encounter notes, upcoming appointments, etc.  Non-urgent messages can be sent to your provider as well.   To learn more about what you can do with MyChart, go to [email or web address].    Your next appointment:   3 month(s)  The format for your next appointment:   In Person  Provider:   You may see Nona Dell, MD or one of the following Advanced Practice Providers on your designated Care Team:    Randall An, PA-C     Other Instructions Please keep a bp log for 3-4 weeks and update our office of the  results.

## 2020-04-14 ENCOUNTER — Other Ambulatory Visit: Payer: Self-pay

## 2020-04-14 ENCOUNTER — Ambulatory Visit
Admission: RE | Admit: 2020-04-14 | Discharge: 2020-04-14 | Disposition: A | Discharge status: Home / Self Care | Payer: Medicare PPO | Source: Ambulatory Visit | Attending: Neurosurgery | Admitting: Neurosurgery

## 2020-04-14 DIAGNOSIS — S22080A Wedge compression fracture of T11-T12 vertebra, initial encounter for closed fracture: Secondary | ICD-10-CM | POA: Diagnosis not present

## 2020-04-14 DIAGNOSIS — M545 Low back pain, unspecified: Secondary | ICD-10-CM | POA: Diagnosis not present

## 2020-04-16 ENCOUNTER — Other Ambulatory Visit: Payer: Self-pay | Admitting: Neurosurgery

## 2020-04-16 ENCOUNTER — Ambulatory Visit (HOSPITAL_COMMUNITY): Payer: Medicare PPO

## 2020-04-16 ENCOUNTER — Telehealth: Payer: Self-pay | Admitting: *Deleted

## 2020-04-16 ENCOUNTER — Encounter (HOSPITAL_COMMUNITY): Payer: Self-pay

## 2020-04-16 DIAGNOSIS — M549 Dorsalgia, unspecified: Secondary | ICD-10-CM | POA: Diagnosis not present

## 2020-04-16 DIAGNOSIS — S22080G Wedge compression fracture of T11-T12 vertebra, subsequent encounter for fracture with delayed healing: Secondary | ICD-10-CM | POA: Diagnosis not present

## 2020-04-16 NOTE — Telephone Encounter (Signed)
   Primary Cardiologist: Nona Dell, MD  Chart reviewed as part of pre-operative protocol coverage.   87 yo female with: - (HFpEF) heart failure with preserved ejection fraction  - Permanent atrial fibrillation  - HTN - 03/19/2020: Creatinine, Ser 1.25   - Cath 01/2008: no CAD  Echocardiogram 09/28/19: EF 60-65, pulmo HTN, PASP 45, no sig valve dz  Last OV 04/12/20 with Randall An, PA-C  Surgery: Kyphoplasty on 04/20/20 Surgeon: Dr. Maeola Harman  RCRI: 0.9%  Patient was contacted 04/16/2020 in reference to pre-operative risk assessment for pending surgery as outlined below.    Since last seen, Katherine Lane has done well.  Her breathing and weight has remained stable. She has not had chest pain, syncope.    Recommendations:  Therefore, based on ACC/AHA guidelines, the patient is at acceptable risk for the planned procedure without further cardiovascular testing.   Per our PharmD clinic, Rivaroxaban (Xarelto) can be held for 4 days prior to the procedure.  She was advised to not take it tonight.  She should resume Rivaroxaban (Xarelto) post op as soon as it is safe from a bleeding persective.   Please call with questions. Tereso Newcomer, PA-C 04/16/2020, 5:50 PM

## 2020-04-16 NOTE — Telephone Encounter (Signed)
87 yo female with: - (HFpEF) heart failure with preserved ejection fraction  - Permanent atrial fibrillation  - HTN - 03/19/2020: Creatinine, Ser 1.25   - Cath 01/2008: no CAD  Echocardiogram 09/28/19: EF 60-65, pulmo HTN, PASP 45, no sig valve dz  Last OV 04/12/20 with Randall An, PA-C  Surgery: Kyphoplasty on 04/20/20 Surgeon: Dr. Maeola Harman  RCRI: 0.9%  Will fwd to PharmD for anticoagulation recommendations. Then call pt to make sure no significant change since last week. Tereso Newcomer, PA-C    04/16/2020 4:44 PM

## 2020-04-16 NOTE — Telephone Encounter (Signed)
Patient with diagnosis of afib on Xarelto for anticoagulation.    Procedure:  T11, T12 KYPHOPLASTY  Date of procedure: 04/20/20  CHA2DS2-VASc Score = 5  This indicates a 7.2% annual risk of stroke. The patient's score is based upon: CHF History: Yes HTN History: Yes Diabetes History: No Stroke History: No Vascular Disease History: No Age Score: 2 Gender Score: 1     CrCl 27 ml/min  Per office protocol, patient can hold Xarelto for 4 days prior to procedure.  Please make sure she did not take today.

## 2020-04-16 NOTE — Telephone Encounter (Signed)
   [facility] Pre-operative Risk Assessment    HEARTCARE STAFF: - Please ensure there is not already an duplicate clearance open for this procedure. - Under Visit Info/Reason for Call, type in Other and utilize the format Clearance MM/DD/YY or Clearance TBD. Do not use dashes or single digits. - If request is for dental extraction, please clarify the # of teeth to be extracted.  Request for surgical clearance: URGENT PER REQUEST   1. What type of surgery is being performed? T11, T12 KYPHOPLASTY   2. When is this surgery scheduled? 04/20/20   3. What type of clearance is required (medical clearance vs. Pharmacy clearance to hold med vs. Both)? BOTH  4. Are there any medications that need to be held prior to surgery and how long? [place]   5. Practice name and name of physician performing surgery? [facility]; DR. Broadus John STERN   6. What is the office phone number? [phone]   7.   What is the office fax number? [phone]  8.   Anesthesia type (None, local, MAC, general) ? GENERAL   Julaine Hua 04/16/2020, 2:25 PM  _________________________________________________________________   (provider comments below)

## 2020-04-16 NOTE — Telephone Encounter (Signed)
Notes faxed to surgeon. This phone note will be removed from the preop pool. Tereso Newcomer, PA-C  04/16/2020 5:54 PM

## 2020-04-18 ENCOUNTER — Other Ambulatory Visit (HOSPITAL_COMMUNITY)
Admission: RE | Admit: 2020-04-18 | Discharge: 2020-04-18 | Disposition: A | Discharge status: Home / Self Care | Payer: Medicare PPO | Source: Ambulatory Visit | Attending: Neurosurgery | Admitting: Neurosurgery

## 2020-04-18 DIAGNOSIS — Z01812 Encounter for preprocedural laboratory examination: Secondary | ICD-10-CM | POA: Diagnosis not present

## 2020-04-18 DIAGNOSIS — Z20822 Contact with and (suspected) exposure to covid-19: Secondary | ICD-10-CM | POA: Diagnosis not present

## 2020-04-18 LAB — SARS CORONAVIRUS 2 (TAT 6-24 HRS): SARS Coronavirus 2: NEGATIVE

## 2020-04-19 ENCOUNTER — Other Ambulatory Visit: Payer: Self-pay

## 2020-04-19 ENCOUNTER — Encounter (HOSPITAL_COMMUNITY): Payer: Self-pay | Admitting: Neurosurgery

## 2020-04-19 ENCOUNTER — Other Ambulatory Visit (HOSPITAL_COMMUNITY): Payer: Medicare PPO

## 2020-04-19 NOTE — Progress Notes (Signed)
Katherine Lane denies chest pain, palpations or shortness of breath. Patient tested negative  for Covid 04/18/20 and has been in quarantine since that time, a friend is with her at this time, maintaining distances.

## 2020-04-20 ENCOUNTER — Other Ambulatory Visit: Payer: Self-pay

## 2020-04-20 ENCOUNTER — Encounter (HOSPITAL_COMMUNITY)
Admission: RE | Disposition: A | Discharge status: Home / Self Care | Payer: Self-pay | Source: Home / Self Care | Attending: Neurosurgery

## 2020-04-20 ENCOUNTER — Ambulatory Visit (HOSPITAL_COMMUNITY): Payer: Medicare PPO

## 2020-04-20 ENCOUNTER — Encounter (HOSPITAL_COMMUNITY): Payer: Self-pay | Admitting: Neurosurgery

## 2020-04-20 ENCOUNTER — Ambulatory Visit (HOSPITAL_COMMUNITY): Payer: Medicare PPO | Admitting: Anesthesiology

## 2020-04-20 ENCOUNTER — Observation Stay (HOSPITAL_COMMUNITY)
Admission: RE | Admit: 2020-04-20 | Discharge: 2020-04-21 | Disposition: A | Discharge status: Home / Self Care | Payer: Medicare PPO | Attending: Neurosurgery | Admitting: Neurosurgery

## 2020-04-20 DIAGNOSIS — E876 Hypokalemia: Secondary | ICD-10-CM | POA: Diagnosis not present

## 2020-04-20 DIAGNOSIS — S22080G Wedge compression fracture of T11-T12 vertebra, subsequent encounter for fracture with delayed healing: Secondary | ICD-10-CM | POA: Diagnosis not present

## 2020-04-20 DIAGNOSIS — Z79899 Other long term (current) drug therapy: Secondary | ICD-10-CM | POA: Diagnosis not present

## 2020-04-20 DIAGNOSIS — Z419 Encounter for procedure for purposes other than remedying health state, unspecified: Secondary | ICD-10-CM

## 2020-04-20 DIAGNOSIS — Z7901 Long term (current) use of anticoagulants: Secondary | ICD-10-CM | POA: Diagnosis not present

## 2020-04-20 DIAGNOSIS — M4854XA Collapsed vertebra, not elsewhere classified, thoracic region, initial encounter for fracture: Secondary | ICD-10-CM | POA: Diagnosis not present

## 2020-04-20 DIAGNOSIS — R2681 Unsteadiness on feet: Secondary | ICD-10-CM | POA: Diagnosis not present

## 2020-04-20 DIAGNOSIS — S22089A Unspecified fracture of T11-T12 vertebra, initial encounter for closed fracture: Secondary | ICD-10-CM | POA: Diagnosis not present

## 2020-04-20 DIAGNOSIS — X58XXXS Exposure to other specified factors, sequela: Secondary | ICD-10-CM | POA: Insufficient documentation

## 2020-04-20 DIAGNOSIS — I482 Chronic atrial fibrillation, unspecified: Secondary | ICD-10-CM | POA: Diagnosis not present

## 2020-04-20 DIAGNOSIS — S22009A Unspecified fracture of unspecified thoracic vertebra, initial encounter for closed fracture: Secondary | ICD-10-CM | POA: Diagnosis present

## 2020-04-20 DIAGNOSIS — M546 Pain in thoracic spine: Secondary | ICD-10-CM | POA: Diagnosis not present

## 2020-04-20 DIAGNOSIS — F418 Other specified anxiety disorders: Secondary | ICD-10-CM | POA: Diagnosis not present

## 2020-04-20 DIAGNOSIS — M8008XA Age-related osteoporosis with current pathological fracture, vertebra(e), initial encounter for fracture: Secondary | ICD-10-CM | POA: Diagnosis not present

## 2020-04-20 HISTORY — DX: Other complications of anesthesia, initial encounter: T88.59XA

## 2020-04-20 HISTORY — DX: Cardiac arrhythmia, unspecified: I49.9

## 2020-04-20 HISTORY — DX: Pneumonia, unspecified organism: J18.9

## 2020-04-20 HISTORY — PX: KYPHOPLASTY: SHX5884

## 2020-04-20 LAB — SURGICAL PCR SCREEN
MRSA, PCR: NEGATIVE
Staphylococcus aureus: NEGATIVE

## 2020-04-20 LAB — BASIC METABOLIC PANEL
Anion gap: 9 (ref 5–15)
BUN: 32 mg/dL — ABNORMAL HIGH (ref 8–23)
CO2: 25 mmol/L (ref 22–32)
Calcium: 9.3 mg/dL (ref 8.9–10.3)
Chloride: 102 mmol/L (ref 98–111)
Creatinine, Ser: 1.45 mg/dL — ABNORMAL HIGH (ref 0.44–1.00)
GFR, Estimated: 35 mL/min — ABNORMAL LOW (ref >60)
Glucose, Bld: 93 mg/dL (ref 70–99)
Potassium: 4.5 mmol/L (ref 3.5–5.1)
Sodium: 136 mmol/L (ref 135–145)

## 2020-04-20 SURGERY — KYPHOPLASTY
Anesthesia: General | Site: Back

## 2020-04-20 MED ORDER — ACETAMINOPHEN 500 MG PO TABS: 1000.0000 mg | ORAL_TABLET | Freq: Once | ORAL | Status: DC | PRN

## 2020-04-20 MED ORDER — PROPOFOL 10 MG/ML IV BOLUS
INTRAVENOUS | Status: AC
  Filled 2020-04-20: qty 20

## 2020-04-20 MED ORDER — METOPROLOL SUCCINATE ER 100 MG PO TB24
100.0000 mg | ORAL_TABLET | Freq: Every day | ORAL | Status: DC
  Administered 2020-04-21: 100 mg via ORAL
  Filled 2020-04-20: qty 1

## 2020-04-20 MED ORDER — OXYCODONE HCL 5 MG PO TABS: 5.0000 mg | ORAL_TABLET | ORAL | Status: DC | PRN

## 2020-04-20 MED ORDER — PROPOFOL 10 MG/ML IV BOLUS
INTRAVENOUS | Status: DC | PRN
  Administered 2020-04-20: 100 mg via INTRAVENOUS
  Administered 2020-04-20: 75 ug/kg/min via INTRAVENOUS

## 2020-04-20 MED ORDER — CHLORHEXIDINE GLUCONATE 0.12 % MT SOLN: 15.0000 mL | Freq: Once | OROMUCOSAL | Status: AC

## 2020-04-20 MED ORDER — ACETAMINOPHEN 10 MG/ML IV SOLN
1000.0000 mg | Freq: Once | INTRAVENOUS | Status: DC | PRN
  Administered 2020-04-20: 1000 mg via INTRAVENOUS

## 2020-04-20 MED ORDER — FENTANYL CITRATE (PF) 250 MCG/5ML IJ SOLN
INTRAMUSCULAR | Status: DC | PRN
  Administered 2020-04-20: 25 ug via INTRAVENOUS
  Administered 2020-04-20 (×2): 50 ug via INTRAVENOUS

## 2020-04-20 MED ORDER — FLEET ENEMA 7-19 GM/118ML RE ENEM: 1.0000 | ENEMA | Freq: Once | RECTAL | Status: DC | PRN

## 2020-04-20 MED ORDER — IOPAMIDOL (ISOVUE-300) INJECTION 61%
INTRAVENOUS | Status: DC | PRN
  Administered 2020-04-20: 50 mL

## 2020-04-20 MED ORDER — PROPOFOL 500 MG/50ML IV EMUL: INTRAVENOUS | Status: DC | PRN

## 2020-04-20 MED ORDER — LIDOCAINE-EPINEPHRINE 1 %-1:100000 IJ SOLN
INTRAMUSCULAR | Status: AC
  Filled 2020-04-20: qty 1

## 2020-04-20 MED ORDER — METHOCARBAMOL 1000 MG/10ML IJ SOLN
500.0000 mg | Freq: Four times a day (QID) | INTRAVENOUS | Status: DC | PRN
  Filled 2020-04-20: qty 5

## 2020-04-20 MED ORDER — ZOLPIDEM TARTRATE 5 MG PO TABS: 5.0000 mg | ORAL_TABLET | Freq: Every evening | ORAL | Status: DC | PRN

## 2020-04-20 MED ORDER — 0.9 % SODIUM CHLORIDE (POUR BTL) OPTIME
TOPICAL | Status: DC | PRN
  Administered 2020-04-20: 1000 mL

## 2020-04-20 MED ORDER — LIDOCAINE 2% (20 MG/ML) 5 ML SYRINGE
INTRAMUSCULAR | Status: DC | PRN
  Administered 2020-04-20: 40 mg via INTRAVENOUS

## 2020-04-20 MED ORDER — DEXAMETHASONE SODIUM PHOSPHATE 10 MG/ML IJ SOLN
INTRAMUSCULAR | Status: AC
  Filled 2020-04-20: qty 1

## 2020-04-20 MED ORDER — HYDROMORPHONE HCL 2 MG PO TABS: 2.0000 mg | ORAL_TABLET | ORAL | Status: DC | PRN

## 2020-04-20 MED ORDER — POTASSIUM CHLORIDE CRYS ER 20 MEQ PO TBCR: 20.0000 meq | EXTENDED_RELEASE_TABLET | Freq: Every morning | ORAL | Status: DC

## 2020-04-20 MED ORDER — DOCUSATE SODIUM 100 MG PO CAPS
100.0000 mg | ORAL_CAPSULE | Freq: Two times a day (BID) | ORAL | Status: DC
  Administered 2020-04-20: 100 mg via ORAL
  Filled 2020-04-20 (×2): qty 1

## 2020-04-20 MED ORDER — FLUTICASONE PROPIONATE 50 MCG/ACT NA SUSP
2.0000 | Freq: Every day | NASAL | Status: DC | PRN
  Filled 2020-04-20: qty 16

## 2020-04-20 MED ORDER — MAGNESIUM OXIDE 400 (241.3 MG) MG PO TABS
200.0000 mg | ORAL_TABLET | Freq: Every day | ORAL | Status: DC
Start: 2020-04-20 — End: 2020-04-21
  Administered 2020-04-20: 200 mg via ORAL
  Filled 2020-04-20: qty 1

## 2020-04-20 MED ORDER — HYDROCORTISONE (PERIANAL) 2.5 % EX CREA
1.0000 "application " | TOPICAL_CREAM | Freq: Two times a day (BID) | CUTANEOUS | Status: DC | PRN
  Filled 2020-04-20: qty 28.35

## 2020-04-20 MED ORDER — ACETAMINOPHEN 10 MG/ML IV SOLN
INTRAVENOUS | Status: AC
  Filled 2020-04-20: qty 100

## 2020-04-20 MED ORDER — CHLORHEXIDINE GLUCONATE CLOTH 2 % EX PADS: 6.0000 | MEDICATED_PAD | Freq: Once | CUTANEOUS | Status: DC

## 2020-04-20 MED ORDER — LOSARTAN POTASSIUM 50 MG PO TABS
50.0000 mg | ORAL_TABLET | Freq: Two times a day (BID) | ORAL | Status: DC
  Administered 2020-04-21: 50 mg via ORAL
  Filled 2020-04-20: qty 1

## 2020-04-20 MED ORDER — POLYVINYL ALCOHOL 1.4 % OP SOLN
1.0000 [drp] | Freq: Every day | OPHTHALMIC | Status: DC
  Administered 2020-04-20: 1 [drp] via OPHTHALMIC
  Filled 2020-04-20: qty 15

## 2020-04-20 MED ORDER — LORAZEPAM 0.5 MG PO TABS
0.5000 mg | ORAL_TABLET | Freq: Two times a day (BID) | ORAL | Status: DC | PRN
  Administered 2020-04-20: 0.5 mg via ORAL
  Filled 2020-04-20: qty 1

## 2020-04-20 MED ORDER — HYDROCODONE-ACETAMINOPHEN 5-325 MG PO TABS
2.0000 | ORAL_TABLET | ORAL | Status: DC | PRN
  Administered 2020-04-20: 2 via ORAL
  Filled 2020-04-20: qty 2

## 2020-04-20 MED ORDER — SODIUM CHLORIDE 0.9% FLUSH: 3.0000 mL | INTRAVENOUS | Status: DC | PRN

## 2020-04-20 MED ORDER — PHENOL 1.4 % MT LIQD: 1.0000 | OROMUCOSAL | Status: DC | PRN

## 2020-04-20 MED ORDER — METHOCARBAMOL 500 MG PO TABS
500.0000 mg | ORAL_TABLET | Freq: Four times a day (QID) | ORAL | Status: DC | PRN
  Administered 2020-04-20 – 2020-04-21 (×2): 500 mg via ORAL
  Filled 2020-04-20: qty 1

## 2020-04-20 MED ORDER — SODIUM CHLORIDE 0.9% FLUSH
3.0000 mL | Freq: Two times a day (BID) | INTRAVENOUS | Status: DC
  Administered 2020-04-20 (×2): 3 mL via INTRAVENOUS

## 2020-04-20 MED ORDER — ACETAMINOPHEN 650 MG RE SUPP: 650.0000 mg | RECTAL | Status: DC | PRN

## 2020-04-20 MED ORDER — LACTATED RINGERS IV SOLN: INTRAVENOUS | Status: DC

## 2020-04-20 MED ORDER — DILTIAZEM HCL ER COATED BEADS 120 MG PO CP24
120.0000 mg | ORAL_CAPSULE | Freq: Every day | ORAL | Status: DC
  Administered 2020-04-21: 120 mg via ORAL
  Filled 2020-04-20: qty 1

## 2020-04-20 MED ORDER — MENTHOL 3 MG MT LOZG: 1.0000 | LOZENGE | OROMUCOSAL | Status: DC | PRN

## 2020-04-20 MED ORDER — DEXAMETHASONE SODIUM PHOSPHATE 10 MG/ML IJ SOLN
INTRAMUSCULAR | Status: DC | PRN
  Administered 2020-04-20: 10 mg via INTRAVENOUS

## 2020-04-20 MED ORDER — ALUM & MAG HYDROXIDE-SIMETH 200-200-20 MG/5ML PO SUSP: 30.0000 mL | Freq: Four times a day (QID) | ORAL | Status: DC | PRN

## 2020-04-20 MED ORDER — ONDANSETRON HCL 4 MG/2ML IJ SOLN
INTRAMUSCULAR | Status: AC
  Filled 2020-04-20: qty 2

## 2020-04-20 MED ORDER — ACETAMINOPHEN 325 MG PO TABS
650.0000 mg | ORAL_TABLET | ORAL | Status: DC | PRN
  Administered 2020-04-20 – 2020-04-21 (×2): 650 mg via ORAL
  Filled 2020-04-20 (×2): qty 2

## 2020-04-20 MED ORDER — LORAZEPAM 0.5 MG PO TABS: 0.5000 mg | ORAL_TABLET | Freq: Two times a day (BID) | ORAL | Status: DC | PRN

## 2020-04-20 MED ORDER — FUROSEMIDE 40 MG PO TABS
60.0000 mg | ORAL_TABLET | Freq: Every day | ORAL | Status: DC
  Administered 2020-04-21: 60 mg via ORAL
  Filled 2020-04-20: qty 1

## 2020-04-20 MED ORDER — PHENYLEPHRINE IN HARD FAT 0.25 % RE SUPP
1.0000 | Freq: Two times a day (BID) | RECTAL | Status: DC | PRN
  Filled 2020-04-20: qty 1

## 2020-04-20 MED ORDER — ROCURONIUM BROMIDE 10 MG/ML (PF) SYRINGE
PREFILLED_SYRINGE | INTRAVENOUS | Status: AC
  Filled 2020-04-20: qty 10

## 2020-04-20 MED ORDER — PHENYLEPHRINE 40 MCG/ML (10ML) SYRINGE FOR IV PUSH (FOR BLOOD PRESSURE SUPPORT)
PREFILLED_SYRINGE | INTRAVENOUS | Status: DC | PRN
  Administered 2020-04-20 (×2): 80 ug via INTRAVENOUS

## 2020-04-20 MED ORDER — FENTANYL CITRATE (PF) 100 MCG/2ML IJ SOLN: 25.0000 ug | INTRAMUSCULAR | Status: DC | PRN

## 2020-04-20 MED ORDER — BISACODYL 10 MG RE SUPP: 10.0000 mg | Freq: Every day | RECTAL | Status: DC | PRN

## 2020-04-20 MED ORDER — BUPIVACAINE HCL (PF) 0.5 % IJ SOLN
INTRAMUSCULAR | Status: AC
  Filled 2020-04-20: qty 30

## 2020-04-20 MED ORDER — HYDRALAZINE HCL 10 MG PO TABS
25.0000 mg | ORAL_TABLET | Freq: Three times a day (TID) | ORAL | Status: DC
Start: 2020-04-20 — End: 2020-04-21
  Administered 2020-04-20 – 2020-04-21 (×3): 25 mg via ORAL
  Filled 2020-04-20 (×3): qty 3

## 2020-04-20 MED ORDER — POLYETHYLENE GLYCOL 3350 17 G PO PACK: 17.0000 g | PACK | Freq: Every day | ORAL | Status: DC | PRN

## 2020-04-20 MED ORDER — ACETAMINOPHEN 160 MG/5ML PO SOLN: 1000.0000 mg | Freq: Once | ORAL | Status: DC | PRN

## 2020-04-20 MED ORDER — POLYETHYLENE GLYCOL 3350 17 GM/SCOOP PO POWD: 17.0000 g | Freq: Every day | ORAL | Status: DC | PRN

## 2020-04-20 MED ORDER — CHLORHEXIDINE GLUCONATE 0.12 % MT SOLN
OROMUCOSAL | Status: AC
  Administered 2020-04-20: 15 mL via OROMUCOSAL
  Filled 2020-04-20: qty 15

## 2020-04-20 MED ORDER — OXYCODONE HCL 5 MG PO TABS
5.0000 mg | ORAL_TABLET | Freq: Once | ORAL | Status: DC | PRN
Start: 2020-04-20 — End: 2020-04-20

## 2020-04-20 MED ORDER — OXYCODONE HCL 5 MG/5ML PO SOLN
5.0000 mg | Freq: Once | ORAL | Status: DC | PRN
Start: 2020-04-20 — End: 2020-04-20

## 2020-04-20 MED ORDER — SODIUM CHLORIDE 0.9 % IV SOLN
250.0000 mL | INTRAVENOUS | Status: DC
  Administered 2020-04-20: 250 mL via INTRAVENOUS

## 2020-04-20 MED ORDER — CEFAZOLIN SODIUM-DEXTROSE 2-4 GM/100ML-% IV SOLN
2.0000 g | Freq: Three times a day (TID) | INTRAVENOUS | Status: AC
  Administered 2020-04-20 (×2): 2 g via INTRAVENOUS
  Filled 2020-04-20: qty 100

## 2020-04-20 MED ORDER — CEFAZOLIN SODIUM-DEXTROSE 2-4 GM/100ML-% IV SOLN
2.0000 g | INTRAVENOUS | Status: AC
  Administered 2020-04-20: 2 g via INTRAVENOUS
  Filled 2020-04-20: qty 100

## 2020-04-20 MED ORDER — ONDANSETRON HCL 4 MG/2ML IJ SOLN
INTRAMUSCULAR | Status: DC | PRN
  Administered 2020-04-20: 4 mg via INTRAVENOUS

## 2020-04-20 MED ORDER — ADULT MULTIVITAMIN W/MINERALS CH
1.0000 | ORAL_TABLET | Freq: Every day | ORAL | Status: DC
  Administered 2020-04-21: 1 via ORAL
  Filled 2020-04-20: qty 1

## 2020-04-20 MED ORDER — KCL IN DEXTROSE-NACL 20-5-0.45 MEQ/L-%-% IV SOLN: INTRAVENOUS | Status: DC

## 2020-04-20 MED ORDER — HYDROMORPHONE HCL 1 MG/ML IJ SOLN
0.5000 mg | INTRAMUSCULAR | Status: DC | PRN
Start: 2020-04-20 — End: 2020-04-21

## 2020-04-20 MED ORDER — LIDOCAINE 2% (20 MG/ML) 5 ML SYRINGE
INTRAMUSCULAR | Status: AC
  Filled 2020-04-20: qty 5

## 2020-04-20 MED ORDER — METOLAZONE 2.5 MG PO TABS
2.5000 mg | ORAL_TABLET | ORAL | Status: DC | PRN
  Filled 2020-04-20: qty 1

## 2020-04-20 MED ORDER — METHOCARBAMOL 500 MG PO TABS
ORAL_TABLET | ORAL | Status: AC
  Filled 2020-04-20: qty 1

## 2020-04-20 MED ORDER — ROCURONIUM BROMIDE 10 MG/ML (PF) SYRINGE
PREFILLED_SYRINGE | INTRAVENOUS | Status: DC | PRN
  Administered 2020-04-20: 50 mg via INTRAVENOUS

## 2020-04-20 MED ORDER — POTASSIUM CHLORIDE CRYS ER 20 MEQ PO TBCR: 10.0000 meq | EXTENDED_RELEASE_TABLET | Freq: Every day | ORAL | Status: DC

## 2020-04-20 MED ORDER — FENTANYL CITRATE (PF) 250 MCG/5ML IJ SOLN
INTRAMUSCULAR | Status: AC
  Filled 2020-04-20: qty 5

## 2020-04-20 MED ORDER — ORAL CARE MOUTH RINSE: 15.0000 mL | Freq: Once | OROMUCOSAL | Status: AC

## 2020-04-20 MED ORDER — LIDOCAINE-HYDROCORTISONE ACE 3-2.5 % RE KIT: 1.0000 "application " | PACK | Freq: Every day | RECTAL | Status: DC | PRN

## 2020-04-20 MED ORDER — RIVAROXABAN 20 MG PO TABS
20.0000 mg | ORAL_TABLET | Freq: Every day | ORAL | Status: DC
  Filled 2020-04-20: qty 1

## 2020-04-20 MED ORDER — BUPIVACAINE HCL (PF) 0.5 % IJ SOLN
INTRAMUSCULAR | Status: DC | PRN
  Administered 2020-04-20: 4 mL

## 2020-04-20 MED ORDER — SUGAMMADEX SODIUM 200 MG/2ML IV SOLN
INTRAVENOUS | Status: DC | PRN
  Administered 2020-04-20 (×2): 100 mg via INTRAVENOUS

## 2020-04-20 MED ORDER — LIDOCAINE-EPINEPHRINE 1 %-1:100000 IJ SOLN
INTRAMUSCULAR | Status: DC | PRN
  Administered 2020-04-20: 4 mL

## 2020-04-20 SURGICAL SUPPLY — 40 items
BLADE CLIPPER SURG (BLADE) IMPLANT
BLADE SURG 15 STRL LF DISP TIS (BLADE) ×1 IMPLANT
BLADE SURG 15 STRL SS (BLADE) ×2
CARTRIDGE OIL MAESTRO DRILL (MISCELLANEOUS) IMPLANT
CEMENT KYPHON CX01A KIT/MIXER (Cement) ×2 IMPLANT
COVER WAND RF STERILE (DRAPES) IMPLANT
DECANTER SPIKE VIAL GLASS SM (MISCELLANEOUS) ×2 IMPLANT
DERMABOND ADVANCED (GAUZE/BANDAGES/DRESSINGS) ×1
DERMABOND ADVANCED .7 DNX12 (GAUZE/BANDAGES/DRESSINGS) ×1 IMPLANT
DIFFUSER DRILL AIR PNEUMATIC (MISCELLANEOUS) IMPLANT
DRAPE C-ARM 42X72 X-RAY (DRAPES) ×2 IMPLANT
DRAPE HALF SHEET 40X57 (DRAPES) ×2 IMPLANT
DRAPE LAPAROTOMY 100X72X124 (DRAPES) ×2 IMPLANT
DRAPE SURG 17X23 STRL (DRAPES) ×2 IMPLANT
DRAPE WARM FLUID 44X44 (DRAPES) IMPLANT
DURAPREP 26ML APPLICATOR (WOUND CARE) ×2 IMPLANT
GAUZE 4X4 16PLY RFD (DISPOSABLE) ×2 IMPLANT
GLOVE BIO SURGEON STRL SZ8 (GLOVE) ×2 IMPLANT
GLOVE BIOGEL PI IND STRL 8.5 (GLOVE) ×1 IMPLANT
GLOVE BIOGEL PI INDICATOR 8.5 (GLOVE) ×1
GLOVE EXAM NITRILE XL STR (GLOVE) IMPLANT
GOWN STRL REUS W/ TWL LRG LVL3 (GOWN DISPOSABLE) ×1 IMPLANT
GOWN STRL REUS W/ TWL XL LVL3 (GOWN DISPOSABLE) ×2 IMPLANT
GOWN STRL REUS W/TWL 2XL LVL3 (GOWN DISPOSABLE) IMPLANT
GOWN STRL REUS W/TWL LRG LVL3 (GOWN DISPOSABLE) ×2
GOWN STRL REUS W/TWL XL LVL3 (GOWN DISPOSABLE) ×4
KIT BASIN OR (CUSTOM PROCEDURE TRAY) ×2 IMPLANT
KIT TURNOVER KIT B (KITS) ×2 IMPLANT
NEEDLE HYPO 25X1 1.5 SAFETY (NEEDLE) ×2 IMPLANT
NS IRRIG 1000ML POUR BTL (IV SOLUTION) ×2 IMPLANT
OIL CARTRIDGE MAESTRO DRILL (MISCELLANEOUS)
PACK SURGICAL SETUP 50X90 (CUSTOM PROCEDURE TRAY) ×2 IMPLANT
PAD ARMBOARD 7.5X6 YLW CONV (MISCELLANEOUS) ×6 IMPLANT
STAPLER SKIN PROX WIDE 3.9 (STAPLE) ×2 IMPLANT
SUT VIC AB 3-0 SH 8-18 (SUTURE) ×2 IMPLANT
SYR CONTROL 10ML LL (SYRINGE) ×2 IMPLANT
TOWEL GREEN STERILE (TOWEL DISPOSABLE) ×2 IMPLANT
TOWEL GREEN STERILE FF (TOWEL DISPOSABLE) IMPLANT
TRAY KYPHOPAK 15/2 EXPRESS CDS (KITS) ×2 IMPLANT
TRAY KYPHOPAK 20/3 ONESTEP 1ST (MISCELLANEOUS) IMPLANT

## 2020-04-20 NOTE — Progress Notes (Signed)
Awake, alert, conversant.  MAEW with good power.  Back pain much improved.  Doing well.

## 2020-04-20 NOTE — Anesthesia Procedure Notes (Signed)
Procedure Name: Intubation Date/Time: 04/20/2020 10:17 AM Performed by: Aundria Rud, CRNA Pre-anesthesia Checklist: Patient identified, Emergency Drugs available, Suction available and Patient being monitored Patient Re-evaluated:Patient Re-evaluated prior to induction Oxygen Delivery Method: Circle System Utilized Preoxygenation: Pre-oxygenation with 100% oxygen Induction Type: IV induction Ventilation: Mask ventilation without difficulty Laryngoscope Size: Miller and 2 Grade View: Grade I Tube type: Oral Tube size: 7.0 mm Number of attempts: 1 Airway Equipment and Method: Stylet Placement Confirmation: ETT inserted through vocal cords under direct vision,  positive ETCO2 and breath sounds checked- equal and bilateral Secured at: 21 cm Tube secured with: Tape Dental Injury: Teeth and Oropharynx as per pre-operative assessment

## 2020-04-20 NOTE — Brief Op Note (Signed)
04/20/2020  11:10 AM  PATIENT:  Katherine Lane  87 y.o. female  PRE-OPERATIVE DIAGNOSIS:  Compression fracture of thoracic eleven vertebra and thoracic twelve vertebra with delayed healing and intractable pain  POST-OPERATIVE DIAGNOSIS: Compression fracture of thoracic eleven vertebra and thoracic twelve vertebra with delayed healing and intractable pain   PROCEDURE:  Procedure(s): Thoracic eleven, Thoracic twelve Kyphoplasty (N/A)  SURGEON:  Surgeon(s) and Role:    * Denyse Fillion, MD - Primary  PHYSICIAN ASSISTANT:   ASSISTANTS: none   ANESTHESIA:   general  EBL: Minimal  BLOOD ADMINISTERED:none  DRAINS: none   LOCAL MEDICATIONS USED:  MARCAINE    and LIDOCAINE   SPECIMEN:  No Specimen  DISPOSITION OF SPECIMEN:  N/A  COUNTS:  YES  TOURNIQUET:  * No tourniquets in log *  DICTATION: Patient is 87 year old woman with scoliosis and severe back pain, who has now developed a painful T 11 and  T 12 compression fractures, which is proving debilitatingly painful to her.  It was elected to take her to surgery for kyphoplasty procedure.  PROCEDURE:  Following the smooth and uncomplicated induction of general endotracheal anesthesia, the patient was placed in a prone position on chest rolls.  C-arm fluoroscopy was positioned in both the AP and lateral planes, centered on the T 11 and T 12 vertebrae.  Her back was prepped and draped in the usual sterile fashion with Duraprep.  Using a right uni-pedicular approach, the right T 11 pedicle and T12  pedicles were entered with the trochar using standard landmarks and trochar was advanced into the vertebral bodies.  The drill was used, followed by a 15 cc Kyphon balloon at each level, which was used to re-expand the broken vertebra.  Subsequently, approximately 3  cc of bone cement was placed into the void created by the balloon and was seen to fill each broken vertebra in both the AP and lateral direction with good interdigitation and with  minimal extravasation. The bone void fillers were then removed.  Final X-ray demonstrated good filling within the fractured vertebrae.  The incisions were closed with a single 3-0 vicryl stitch at each site and dressed with Dermabond. The patient was returned to the OR gurney and extubated in the OR and taken to Recovery in stable and satisfactory condition, having tolerated the procedure well.  Counts were correct at the end of the case.  PLAN OF CARE: Admit for overnight observation  PATIENT DISPOSITION:  PACU - hemodynamically stable.   Delay start of Pharmacological VTE agent (>24hrs) due to surgical blood loss or risk of bleeding: yes  

## 2020-04-20 NOTE — Anesthesia Preprocedure Evaluation (Addendum)
Anesthesia Evaluation  Patient identified by MRN, date of birth, ID band Patient awake    Reviewed: Allergy & Precautions, NPO status , Patient's Chart, lab work & pertinent test results  History of Anesthesia Complications (+) PONV and history of anesthetic complications  Airway Mallampati: II  TM Distance: >3 FB Neck ROM: Full    Dental  (+) Dental Advisory Given   Pulmonary neg shortness of breath, neg sleep apnea, neg COPD, neg recent URI,  Covid-19 Nucleic Acid Test Results Lab Results      Component                Value               Date                      SARSCOV2NAA              NEGATIVE            04/18/2020                SARSCOV2NAA              Not Detected        12/28/2019                SARSCOV2NAA              NEGATIVE            09/27/2019                SARSCOV2NAA              NEGATIVE            08/25/2019                SARSCOV2NAA              Detected (A)        11/26/2018              breath sounds clear to auscultation       Cardiovascular hypertension, Pt. on medications and Pt. on home beta blockers +CHF  + dysrhythmias Atrial Fibrillation  Rhythm:Regular     Neuro/Psych  Headaches, PSYCHIATRIC DISORDERS Anxiety Depression  Neuromuscular disease    GI/Hepatic Neg liver ROS, GERD  ,  Endo/Other  negative endocrine ROSNo results found for: HGBA1C   Renal/GU Renal diseaseLab Results      Component                Value               Date                      CREATININE               1.25 (H)            03/19/2020           Lab Results      Component                Value               Date                      K                        4.6  03/19/2020                Musculoskeletal  (+) Arthritis ,   Abdominal   Peds  Hematology  (+) Blood dyscrasia, anemia , Lab Results      Component                Value               Date                      WBC                       5.9                 02/23/2020                HGB                      10.7 (L)            02/23/2020                HCT                      36.3                02/23/2020                MCV                      82.3                02/23/2020                PLT                      376                 02/23/2020              Anesthesia Other Findings   Reproductive/Obstetrics                            Anesthesia Physical Anesthesia Plan  ASA: II  Anesthesia Plan: General   Post-op Pain Management:    Induction: Intravenous  PONV Risk Score and Plan: 4 or greater and Ondansetron and Dexamethasone  Airway Management Planned: Oral ETT  Additional Equipment: None  Intra-op Plan:   Post-operative Plan: Extubation in OR  Informed Consent: I have reviewed the patients History and Physical, chart, labs and discussed the procedure including the risks, benefits and alternatives for the proposed anesthesia with the patient or authorized representative who has indicated his/her understanding and acceptance.     Dental advisory given  Plan Discussed with: CRNA and Surgeon  Anesthesia Plan Comments:         Anesthesia Quick Evaluation

## 2020-04-20 NOTE — H&P (Signed)
Patient ID:   [id number] Patient: Katherine Lane  Date of Birth: [birth year] Visit Type: Office Visit   Date: 04/16/2020 11:30 AM Provider: Danae Orleans. Venetia Maxon MD   This 87 year old female presents for MRI Review.  HISTORY OF PRESENT ILLNESS: 1.  MRI Review  Patient returns to review her MRIs.  Thoracic MRI demonstrates a chronic T3 fracture which was previously treated by [place] with bracing.  She has subacute T11 and T12 fractures.  Her previous fractures appear stable and healed otherwise.  The patient is in horrible pain and is not able to get any relief.  She is desperate for treatment.  She describes her pain at 10/10 in severity and is tearful during our evaluation.  She is on blood thinners and will need to come off of them but I have recommended proceeding with kyphoplasty procedure of the T11 and T12 fractures on an expedited basis.      Medical/Surgical/Interim History Reviewed, no change.  Last detailed document date:03/16/2017.     PAST MEDICAL HISTORY, SURGICAL HISTORY, FAMILY HISTORY, SOCIAL HISTORY AND REVIEW OF SYSTEMS I have reviewed the patient's past medical, surgical, family and social history as well as the comprehensive review of systems as included on the [facility] history form dated 09/21/2019, which I have signed.  Family History: Reviewed, no changes.  Last detailed document date:03/16/2017.   Social History: Reviewed, no changes. Last detailed document date: 03/16/2017.    MEDICATIONS: (added, continued or stopped this visit) Started Medication Directions Instruction Stopped  Ambien 10 mg tablet take 1 tablet by oral route  every day at bedtime    enalapril maleate 20 mg tablet take 1 tablet by oral route 2 times every day   03/01/2020 hydromorphone 2 mg tablet take 1/2 to 1 tablet by oral route  every 4 hours as needed    lorazepam 0.5 mg tablet take 1 tablet by oral route 3 times every day as  needed    losartan 50 mg tablet take 1 tablet by oral route  every day   03/01/2020 methocarbamol 500 mg tablet take 1 tablet by oral route 3 times every day as needed    metoprolol tartrate 25 mg tablet take 1 tablet by oral route 2 times every day   05/13/2017 Neurontin 300 mg capsule take 1 capsule by oral route 3 times every day    verapamil ER (SR) 240 mg tablet,extended release take 1 tablet by oral route  every day with food    Xarelto 20 mg tablet take 1 tablet by oral route  every day with the evening meal   03/01/2020 Zofran 4 mg tablet take 1 tablet by oral route 3 times every day      ALLERGIES: Ingredient Reaction Medication Name Comment CODEINE Nausea/Vomiting    Reviewed, no changes.    PHYSICAL EXAM:  Vitals Date Temp F BP Pulse Ht In Wt Lb BMI BSA Pain Score 04/16/2020  95/61 67 63 115 20.37  10/10     IMPRESSION:  Severe back pain refractory to bracing and analgesics.  New fractures of T11 and T12.  PLAN: Proceed with kyphoplasty procedure T11 and T12.  Orders: Diagnostic Procedures: Assessment Procedure S22.080G Thoraco-lumbar Spine- AP/Lat  Assessment/Plan  # Detail Type Description  1. Assessment Compression fracture of T11 vertebra with delayed healing, subsequent encounter (S22.080G).     2. Assessment Intractable back pain (M54.9).       Pain Management Plan Pain Scale: 10/10. Method: Numeric Pain  Intensity Scale. Location: back. Onset: 05/21/2015. Duration: varies. Quality: discomforting. Pain management follow-up plan of care: Patient will continue medication management..              Provider:  Danae Orleans. Venetia Maxon MD  04/19/2020 06:46 PM    Dictation edited by: Danae Orleans. Venetia Maxon    CC Providers: Ardyth Gal [address] [place] [place],  [place]  [address]   Lilyan Punt  [facility] [address] [place], [place]  [address]               Electronically signed by Danae Orleans. Venetia Maxon MD on 04/19/2020 06:46 PM

## 2020-04-20 NOTE — Transfer of Care (Signed)
Immediate Anesthesia Transfer of Care Note  Patient: Katherine Lane  Procedure(s) Performed: Thoracic eleven, Thoracic twelve Kyphoplasty (N/A Back)  Patient Location: PACU  Anesthesia Type:General  Level of Consciousness: awake, drowsy and patient cooperative  Airway & Oxygen Therapy: Patient Spontanous Breathing  Post-op Assessment: Report given to RN, Post -op Vital signs reviewed and stable and Patient moving all extremities  Post vital signs: Reviewed and stable  Last Vitals:  Vitals Value Taken Time  BP 113/74   Temp    Pulse 68   Resp 12 04/20/20 1116  SpO2 93   Vitals shown include unvalidated device data.  Last Pain:  Vitals:   04/20/20 0901  TempSrc:   PainSc: 10-Worst pain ever      Patients Stated Pain Goal: 4 (04/20/20 0901)  Complications: No complications documented.

## 2020-04-20 NOTE — Interval H&P Note (Signed)
History and Physical Interval Note:  04/20/2020 8:30 AM  Katherine Lane  has presented today for surgery, with the diagnosis of Compression fracture of thoracic 11 vertebra with delayed healing.  The various methods of treatment have been discussed with the patient and family. After consideration of risks, benefits and other options for treatment, the patient has consented to  Procedure(s) with comments: Thoracic 11, Thoracic 12 Kyphoplasty (N/A) - 3C/RM 21 as a surgical intervention.  The patient's history has been reviewed, patient examined, no change in status, stable for surgery.  I have reviewed the patient's chart and labs.  Questions were answered to the patient's satisfaction.     Dorian Heckle

## 2020-04-20 NOTE — Op Note (Signed)
04/20/2020  11:10 AM  PATIENT:  Katherine Lane  87 y.o. female  PRE-OPERATIVE DIAGNOSIS:  Compression fracture of thoracic eleven vertebra and thoracic twelve vertebra with delayed healing and intractable pain  POST-OPERATIVE DIAGNOSIS: Compression fracture of thoracic eleven vertebra and thoracic twelve vertebra with delayed healing and intractable pain   PROCEDURE:  Procedure(s): Thoracic eleven, Thoracic twelve Kyphoplasty (N/A)  SURGEON:  Surgeon(s) and Role:    Maeola Harman, MD - Primary  PHYSICIAN ASSISTANT:   ASSISTANTS: none   ANESTHESIA:   general  EBL: Minimal  BLOOD ADMINISTERED:none  DRAINS: none   LOCAL MEDICATIONS USED:  MARCAINE    and LIDOCAINE   SPECIMEN:  No Specimen  DISPOSITION OF SPECIMEN:  N/A  COUNTS:  YES  TOURNIQUET:  * No tourniquets in log *  DICTATION: Patient is 87 year old woman with scoliosis and severe back pain, who has now developed a painful T 11 and  T 12 compression fractures, which is proving debilitatingly painful to her.  It was elected to take her to surgery for kyphoplasty procedure.  PROCEDURE:  Following the smooth and uncomplicated induction of general endotracheal anesthesia, the patient was placed in a prone position on chest rolls.  C-arm fluoroscopy was positioned in both the AP and lateral planes, centered on the T 11 and T 12 vertebrae.  Her back was prepped and draped in the usual sterile fashion with Duraprep.  Using a right uni-pedicular approach, the right T 11 pedicle and T12  pedicles were entered with the trochar using standard landmarks and trochar was advanced into the vertebral bodies.  The drill was used, followed by a 15 cc Kyphon balloon at each level, which was used to re-expand the broken vertebra.  Subsequently, approximately 3  cc of bone cement was placed into the void created by the balloon and was seen to fill each broken vertebra in both the AP and lateral direction with good interdigitation and with  minimal extravasation. The bone void fillers were then removed.  Final X-ray demonstrated good filling within the fractured vertebrae.  The incisions were closed with a single 3-0 vicryl stitch at each site and dressed with Dermabond. The patient was returned to the OR gurney and extubated in the OR and taken to Recovery in stable and satisfactory condition, having tolerated the procedure well.  Counts were correct at the end of the case.  PLAN OF CARE: Admit for overnight observation  PATIENT DISPOSITION:  PACU - hemodynamically stable.   Delay start of Pharmacological VTE agent (>24hrs) due to surgical blood loss or risk of bleeding: yes

## 2020-04-21 DIAGNOSIS — Z79899 Other long term (current) drug therapy: Secondary | ICD-10-CM | POA: Diagnosis not present

## 2020-04-21 DIAGNOSIS — S22080G Wedge compression fracture of T11-T12 vertebra, subsequent encounter for fracture with delayed healing: Secondary | ICD-10-CM | POA: Diagnosis not present

## 2020-04-21 DIAGNOSIS — R2681 Unsteadiness on feet: Secondary | ICD-10-CM | POA: Diagnosis not present

## 2020-04-21 DIAGNOSIS — Z7901 Long term (current) use of anticoagulants: Secondary | ICD-10-CM | POA: Diagnosis not present

## 2020-04-21 LAB — BASIC METABOLIC PANEL
Anion gap: 11 (ref 5–15)
BUN: 40 mg/dL — ABNORMAL HIGH (ref 8–23)
CO2: 22 mmol/L (ref 22–32)
Calcium: 9.2 mg/dL (ref 8.9–10.3)
Chloride: 102 mmol/L (ref 98–111)
Creatinine, Ser: 1.52 mg/dL — ABNORMAL HIGH (ref 0.44–1.00)
GFR, Estimated: 33 mL/min — ABNORMAL LOW (ref >60)
Glucose, Bld: 145 mg/dL — ABNORMAL HIGH (ref 70–99)
Potassium: 5 mmol/L (ref 3.5–5.1)
Sodium: 135 mmol/L (ref 135–145)

## 2020-04-21 MED ORDER — METHOCARBAMOL 500 MG PO TABS: 500.0000 mg | ORAL_TABLET | Freq: Three times a day (TID) | ORAL | 0 refills | Status: DC | PRN

## 2020-04-21 MED ORDER — RIVAROXABAN 20 MG PO TABS: 20.0000 mg | ORAL_TABLET | Freq: Every day | ORAL | Status: DC

## 2020-04-21 MED ORDER — ACETAMINOPHEN 325 MG PO TABS: 650.0000 mg | ORAL_TABLET | ORAL | Status: DC | PRN

## 2020-04-21 NOTE — Evaluation (Signed)
Occupational Therapy Evaluation/Discharge Patient Details Name: Katherine Lane MRN: [id number] DOB: [birth year] Today's Date: 04/21/2020    History of Present Illness Pt is an 87 y/o female with chronic T3 fx, subacute T11-12 fx. Pt initially treated with bracing though pain has increased. Pt has elected to undergo T11-12 kyphoplasty as surgical intervention.   Clinical Impression   PTA, pt lives alone and has frequent family/friend support during the day for meals, laundry, groceries. Pt typically able to complete ADLs and ambulate without AD though has been having increasing difficulty due to pain and has been using RW. Pt presents now with limitations due to pain, requiring increased time/effort for all tasks. Pt Min A for bed mobility, min guard for mobility to/from bathroom with RW. Pt requires Min A for UB ADLs and up to Mod A for LB ADLs without use of AE. Pt benefits from reminders for spinal precautions. Pt's daughter present and supportive, plans to stay with pt initially (unsure of how long) and a PCA has been set up to assist 2x/wk for 2 hours for showering, meal assist. Recommend HHOT follow-up as well to maximize independence with ADLs/mobility and decrease fall risk. Defer further therapy needs to [facility]. OT to sign off at acute level.     Follow Up Recommendations  Home health OT;Supervision/Assistance - 24 hour    Equipment Recommendations  None recommended by OT (appears well equipped)    Recommendations for Other Services       Precautions / Restrictions Precautions Precautions: Fall;Back Precaution Booklet Issued: Yes (comment) Precaution Comments: no brace needed per orders Restrictions Weight Bearing Restrictions: No      Mobility Bed Mobility Overal bed mobility: Needs Assistance Bed Mobility: Rolling;Sidelying to Sit;Sit to Sidelying Rolling: Min guard Sidelying to sit: Min assist     Sit to sidelying: Min guard General bed mobility comments: Min A for trunk  advancement, cues to avoid twisting and increased time/effort needed secondary to pain.    Transfers Overall transfer level: Needs assistance Equipment used: Rolling walker (2 wheeled) Transfers: Sit to/from [employer] Sit to Stand: Min guard Stand pivot transfers: Min guard       General transfer comment: min guard with cues for keeping straight spine, hand placement on RW. increased time secondary to pain    Balance Overall balance assessment: Needs assistance Sitting-balance support: No upper extremity supported;Feet supported Sitting balance-Leahy Scale: Fair     Standing balance support: Single extremity supported;Bilateral upper extremity supported;During functional activity Standing balance-Leahy Scale: Fair Standing balance comment: fair static standing at sink, benefits from UE support for mobility and dynamic tasks                           ADL either performed or assessed with clinical judgement   ADL Overall ADL's : Needs assistance/impaired Eating/Feeding: Independent;Sitting   Grooming: Supervision/safety;Standing;Wash/dry hands   Upper Body Bathing: Minimal assistance;Sitting   Lower Body Bathing: Moderate assistance;Sit to/from stand   Upper Body Dressing : Set up;Sitting   Lower Body Dressing: Moderate assistance;Sit to/from stand Lower Body Dressing Details (indicate cue type and reason): Pt with difficulty bringing feet to self. Educated on positioning to try, or assistance from family, or use of AE Toilet Transfer: Min guard;Ambulation;RW;Comfort height toilet;Grab bars Toilet Transfer Details (indicate cue type and reason): min guard for safety, increased time due to pain, cues for RW use/safety Toileting- Clothing Manipulation and Hygiene: Supervision/safety;Sit to/from stand Toileting - Architect Details (  indicate cue type and reason): Supervision with cueing for spinal precautions.     Functional mobility  during ADLs: Min guard;Rolling walker;Cueing for sequencing;Cueing for safety General ADL Comments: Limited mostly by pain, cueing for spinal precautions. Pt's daughter present and supportive.     Vision Baseline Vision/History: Wears glasses Wears Glasses: At all times Patient Visual Report: No change from baseline Vision Assessment?: No apparent visual deficits     Perception     Praxis      Pertinent Vitals/Pain Pain Assessment: 0-10 Pain Score: 6  Pain Location: back at surgical site Pain Descriptors / Indicators: Grimacing;Guarding;Moaning;Sore Pain Intervention(s): Monitored during session;Repositioned     Hand Dominance Right   Extremity/Trunk Assessment Upper Extremity Assessment Upper Extremity Assessment: Overall WFL for tasks assessed   Lower Extremity Assessment Lower Extremity Assessment: Defer to PT evaluation   Cervical / Trunk Assessment Cervical / Trunk Assessment: Kyphotic   Communication Communication Communication: No difficulties   Cognition Arousal/Alertness: Awake/alert Behavior During Therapy: WFL for tasks assessed/performed;Anxious Overall Cognitive Status: Impaired/Different from baseline Area of Impairment: Memory;Safety/judgement;Awareness                     Memory: Decreased short-term memory   Safety/Judgement: Decreased awareness of deficits;Decreased awareness of safety Awareness: Emergent   General Comments: some decreased memory noted when obtaining PLOF (daughter present and assisting in answering questions)   General Comments  Daughter present from [place], plans to stay with pt initially    Exercises     Shoulder Instructions      Home Living Family/patient expects to be discharged to:: Private residence Living Arrangements: Alone Available Help at Discharge: Family;Friend(s);Personal care attendant;Available PRN/intermittently Type of Home: House Home Access: Stairs to enter [employer] of  Steps: 2 Entrance Stairs-Rails: Left;Right Home Layout: One level     Bathroom Shower/Tub: Producer, television/film/video: Handicapped height     Home Equipment: Environmental consultant - 2 wheels;Cane - single point;Toilet riser;Shower seat;Bedside commode;Grab bars - tub/shower;Hand held shower head;Adaptive equipment;Other (comment) (has bedrail) Adaptive Equipment: Reacher;Sock aid Additional Comments: Pt has family, friends in/out throughout day to assist. Recently set up a PCA to assist 2 days week for 2 hours for showers, meals, etc.      Prior Functioning/Environment Level of Independence: Needs assistance  Gait / Transfers Assistance Needed: typically does not use AD for mobility but has recently been using RW for mobility due to increasing pain ADL's / Homemaking Assistance Needed: Hx of being able to dress, shower self but increasing difficulty due to pain. Recently hired PCA for 2 days/wk for 2 hours for showers, meal assist. Plans to sponge bathe on days aide does not assist, uses BSC at night 2-3x/night. Has meals on wheels. Family, friends in/out during the day to assist with meals, laundry, grocery shopping, etc            OT Problem List: Decreased strength;Decreased activity tolerance;Impaired balance (sitting and/or standing);Decreased knowledge of use of DME or AE;Decreased knowledge of precautions;Pain      OT Treatment/Interventions:      OT Goals(Current goals can be found in the care plan section) Acute Rehab OT Goals Patient Stated Goal: pain control, be able to return home safely, have HH therapy follow-up OT Goal Formulation: With patient/family  OT Frequency:     Barriers to D/C:            Co-evaluation              AM-PAC  OT "6 Clicks" Daily Activity     Outcome Measure Help from another person eating meals?: None Help from another person taking care of personal grooming?: A Little Help from another person toileting, which includes using toliet,  bedpan, or urinal?: A Little Help from another person bathing (including washing, rinsing, drying)?: A Lot Help from another person to put on and taking off regular upper body clothing?: A Little Help from another person to put on and taking off regular lower body clothing?: A Lot 6 Click Score: 17   End of Session Equipment Utilized During Treatment: Rolling walker Nurse Communication: Mobility status;Other (comment) (pain, DC recs)  Activity Tolerance: Patient limited by pain Patient left: in bed;with call bell/phone within reach;with family/visitor present  OT Visit Diagnosis: Unsteadiness on feet (R26.81);Other abnormalities of gait and mobility (R26.89);Muscle weakness (generalized) (M62.81);Pain Pain - part of body:  (back)                Time: 7622-6333 OT Time Calculation (min): 32 min Charges:  OT General Charges $OT Visit: 1 Visit OT Evaluation $OT Eval Low Complexity: 1 Low OT Treatments $Self Care/Home Management : 8-22 mins  Bradd Canary, OTR/L Acute Rehab Services Office: [phone]  Lorre Munroe 04/21/2020, 9:08 AM

## 2020-04-21 NOTE — Progress Notes (Signed)
Patient alert and oriented, voiding adequately, MAE well with no difficulty. Incision area cdi with no s/s of infection. Patient discharged home per order. Patient and daughter stated understanding of discharge instructions given. Patient has an appointment with Dr Venetia Maxon in 2 weeks

## 2020-04-21 NOTE — TOC Transition Note (Signed)
Transition of Care [facility]) - CM/SW Discharge Note   Patient Details  Name: Katherine Lane MRN: [id number] Date of Birth: 1933/01/22  [facility]) CM/SW Contact:  Lawerance Sabal, RN Phone Number: 04/21/2020, 11:15 AM   Clinical Narrative:   Spoke w patient, she immediately identified [facility] as [facility] provider of choice as she has used them in the past. Referrral made and accepted. Unit staff to provide any needed DME from unit supply     Final next level of care: Home w Home Health Services Barriers to Discharge: No Barriers Identified   Patient Goals and CMS Choice        Discharge Placement                       Discharge Plan and Services                          HH Arranged: PT,OT HH Agency: [facility] Date [facility] Agency Contacted: 04/21/20 Time HH Agency Contacted: 1115 Representative spoke with at [facility] Agency: [name]  Social Determinants of Health (SDOH) Interventions     Readmission Risk Interventions No flowsheet data found.

## 2020-04-21 NOTE — Discharge Summary (Signed)
Physician Discharge Summary  Patient ID: Katherine Lane MRN: [id number] DOB/AGE: 04/29/1932 87 y.o.  Admit date: 04/20/2020 Discharge date: 04/21/2020  Admission Diagnoses:  T12 compression fracture  Discharge Diagnoses:  Same Active Problems:   Thoracic vertebral fracture [facility])   Discharged Condition: Stable  Hospital Course:  Katherine Lane is a 87 y.o. female admitted after elective Kyphoplasty. She was reporting imrovement in back pain, ambulating well, tolerating diet and voiding normally. She was therefore discharged in stable condition.   Treatments: Surgery - Kyphoplasty T12  Discharge Exam: Blood pressure 131/81, pulse 76, temperature 98 F (36.7 C), temperature source Oral, resp. rate 18, height '5\' 3"'  (1.6 m), weight 53.5 kg, SpO2 96 %. Awake, alert, oriented Speech fluent, appropriate CN grossly intact 5/5 BUE/BLE Wound c/d/i  Disposition: Discharge disposition: 01-Home or Self Care       Discharge Instructions    Call MD for:  redness, tenderness, or signs of infection (pain, swelling, redness, odor or green/yellow discharge around incision site)   Complete by: As directed    Call MD for:  temperature >100.4   Complete by: As directed    Diet - low sodium heart healthy   Complete by: As directed    Discharge instructions   Complete by: As directed    Walk at home as much as possible, at least 4 times / day   Increase activity slowly   Complete by: As directed    Lifting restrictions   Complete by: As directed    No lifting > 10 lbs   May shower / Bathe   Complete by: As directed    48 hours after surgery   May walk up steps   Complete by: As directed    No dressing needed   Complete by: As directed    Other Restrictions   Complete by: As directed    No bending/twisting at waist     Allergies as of 04/21/2020      Reactions   Amlodipine Swelling   Leg swelling   Codeine Nausea And Vomiting, Other (See Comments)   Patient states  "intolerance to all pain medications"  (NO OPIOIDS) VERY VIOLENT VOMITING!!   Other Nausea And Vomiting, Other (See Comments)   general anesthesia   Tramadol Nausea And Vomiting, Other (See Comments)   "NO OPIOIDS!!! VERY VIOLENT VOMITING!!" per Med History prior to 02/18/17      Medication List    STOP taking these medications   HYDROmorphone 2 MG tablet Commonly known as: DILAUDID     TAKE these medications   acetaminophen 325 MG tablet Commonly known as: TYLENOL Take 2 tablets (650 mg total) by mouth every 4 (four) hours as needed for mild pain ((score 1 to 3) or temp > 100.5).   diltiazem 120 MG 24 hr capsule Commonly known as: CARDIZEM CD TAKE (1) CAPSULE BY MOUTH ONCE DAILY. What changed:   how much to take  how to take this  when to take this  additional instructions   fluticasone 50 MCG/ACT nasal spray Commonly known as: FLONASE Place 2 sprays into both nostrils daily. What changed:   when to take this  reasons to take this   furosemide 40 MG tablet Commonly known as: LASIX Take 1.5 tablets (60 mg total) by mouth daily.   hydrALAZINE 25 MG tablet Commonly known as: APRESOLINE Take 1 tablet (25 mg total) by mouth 3 (three) times daily.   hydrocortisone 2.5 % rectal cream Commonly known as: ANUSOL-HC  APPLY RECTALLY THREE TIMES DAILY AS DIRECTED What changed: See the new instructions.   Lidocaine-Hydrocortisone Ace 3-2.5 % Kit APPLY TO RECTUM QID AS NEEDED FOR RECTAL PAIN OR BLEEDING What changed:   how much to take  how to take this  when to take this  reasons to take this  additional instructions   LORazepam 0.5 MG tablet Commonly known as: ATIVAN Take 1 tablet (0.5 mg total) by mouth 2 (two) times daily as needed for anxiety or sleep. TAKE ONE TABLET BY MOUTH UP TO TWICE DAILY AS NEEDED. What changed: additional instructions   losartan 50 MG tablet Commonly known as: COZAAR Take 1 tablet (50 mg total) by mouth daily. What changed:  when to take this   Lubricant Eye Drops 0.4-0.3 % Soln Generic drug: Polyethyl Glycol-Propyl Glycol Place 1 drop into both eyes at bedtime.   Magnesium 250 MG Tabs Take 250 mg by mouth at bedtime.   methocarbamol 500 MG tablet Commonly known as: ROBAXIN Take 1 tablet (500 mg total) by mouth every 8 (eight) hours as needed for muscle spasms.   metolazone 2.5 MG tablet Commonly known as: ZAROXOLYN Take 1 tablet (2.5 mg total) by mouth as needed (Take for weight gain > 2 lbs overnight or > 5 lbs in one week.).   metoprolol succinate 100 MG 24 hr tablet Commonly known as: TOPROL-XL TAKE ONE TABLET BY MOUTH ONCE DAILY.   multivitamin with minerals Tabs tablet Take 1 tablet by mouth daily. Centrum Silver   polyethylene glycol powder 17 GM/SCOOP powder Commonly known as: GLYCOLAX/MIRALAX Take 17 g daily as needed by mouth for moderate constipation.   potassium chloride 10 MEQ tablet Commonly known as: KLOR-CON Take 20 meq am and 10 mg pm What changed:   how much to take  how to take this  when to take this  additional instructions   rivaroxaban 20 MG Tabs tablet Commonly known as: Xarelto Take 1 tablet (20 mg total) by mouth daily with supper. Start taking on: April 22, 2020   shark liver oil-cocoa butter 0.25-3-85.5 % suppository Commonly known as: PREPARATION H Place 1 suppository rectally 2 (two) times daily as needed for hemorrhoids.   zolpidem 5 MG tablet Commonly known as: AMBIEN TAKE (1) TABLET BY MOUTH AT BEDTIME FOR SLEEP. What changed:   how much to take  how to take this  when to take this  reasons to take this  additional instructions            Discharge Care Instructions  (From admission, onward)         Start     Ordered   04/21/20 0000  No dressing needed        04/21/20 0814          Follow-up Information    Erline Levine, MD Follow up.   Specialty: Neurosurgery Contact information: [address] [place] [address] [phone]               Signed: Jairo Ben 04/21/2020, 9:59 AM

## 2020-04-21 NOTE — Evaluation (Signed)
Physical Therapy Evaluation and Discharge Patient Details Name: Katherine Lane MRN: [id number] DOB: 1932/09/22 Today's Date: 04/21/2020   History of Present Illness  Pt is an 87 y/o female with chronic T3 fx, subacute T11-12 fx. Pt initially treated with bracing though pain has increased. Pt has elected to undergo T11-12 kyphoplasty as surgical intervention.  Clinical Impression  Pt admitted with above diagnosis. Pt reports 6/10 back pain at surgical site. Precautions and posture reviewed with pt as well as activity level at home and activity modifications. Pt required min A for sit to SL for LE's and had increased pain with scooting in the bed. Pt ambulated 150' with RW and supervision, working on erect posture and safety. Pt would really benefit from HHPT after d/c.  Pt currently with functional limitations due to the deficits listed below (see PT Problem List). Pt will benefit from skilled PT to increase their independence and safety with mobility to allow discharge to the venue listed below.       Follow Up Recommendations Home health PT;Supervision/Assistance - 24 hour    Equipment Recommendations  None recommended by PT    Recommendations for Other Services       Precautions / Restrictions Precautions Precautions: Fall;Back Precaution Booklet Issued: Yes (comment) Precaution Comments: no brace needed per orders. Handout given by OT, reviewed again with pt Restrictions Weight Bearing Restrictions: No      Mobility  Bed Mobility Overal bed mobility: Needs Assistance Bed Mobility: Rolling;Sidelying to Sit;Sit to Sidelying Rolling: Min guard Sidelying to sit: Min guard     Sit to sidelying: Min assist General bed mobility comments: pt able to come to EOB with min-guard but with increased time and effort as well as crying out with pain, encouraged to exhale with pain instead of crying out as she tends to hold breath with this habit. Min A to LE's for return to supine.     Transfers Overall transfer level: Needs assistance Equipment used: Rolling walker (2 wheeled) Transfers: Sit to/from Stand Sit to Stand: Min guard Stand pivot transfers: Min guard       General transfer comment: vc's for hand placement and posture  Ambulation/Gait Ambulation/Gait assistance: Min guard Gait Distance (Feet): 150 Feet Assistive device: Rolling walker (2 wheeled) Gait Pattern/deviations: Step-through pattern;Decreased stride length;Trunk flexed Gait velocity: decreased Gait velocity interpretation: <1.8 ft/sec, indicate of risk for recurrent falls General Gait Details: pt with noted R hip hike and continued pain throughout ambulation. Min-guard for safety and worked on as erect posture as she could maintain  Stairs Stairs:  (verbal education given on use of rail and HHA)          Wheelchair Mobility    Modified Rankin (Stroke Patients Only)       Balance Overall balance assessment: Needs assistance Sitting-balance support: No upper extremity supported;Feet supported Sitting balance-Leahy Scale: Fair     Standing balance support: Single extremity supported;Bilateral upper extremity supported;During functional activity Standing balance-Leahy Scale: Fair Standing balance comment: recommend UE support for all dynamic activity                             Pertinent Vitals/Pain Pain Assessment: 0-10 Pain Score: 6  Pain Location: back at surgical site Pain Descriptors / Indicators: Grimacing;Guarding;Moaning;Sore Pain Intervention(s): Limited activity within patient's tolerance;Monitored during session    [place] expects to be discharged to:: Private residence Living Arrangements: Alone Available Help at Discharge: Family;Friend(s);Personal care attendant;Available  PRN/intermittently Type of Home: House Home Access: Stairs to enter Entrance Stairs-Rails: Chemical engineer of Steps: 2 Home Layout: One  level Home Equipment: Cane - single point;Toilet riser;Shower seat;Bedside commode;Grab bars - tub/shower;Hand held shower head;Adaptive equipment;Other (comment);Walker - 4 wheels (has bedrail) Additional Comments: Pt has family, friends in/out throughout day to assist. Recently set up a PCA to assist 2 days week for 2 hours for showers, meals, etc.    Prior Function Level of Independence: Needs assistance   Gait / Transfers Assistance Needed: typically does not use AD for mobility but has recently been using RW for mobility due to increasing pain  ADL's / Homemaking Assistance Needed: Hx of being able to dress, shower self but increasing difficulty due to pain. Recently hired PCA for 2 days/wk for 2 hours for showers, meal assist. Plans to sponge bathe on days aide does not assist, uses BSC at night 2-3x/night. Has meals on wheels. Family, friends in/out during the day to assist with meals, laundry, grocery shopping, etc  Comments: uses SPC as needed. Daughter helps with cooking since neck fracture, has assistance with cleaning     Hand Dominance   Dominant Hand: Right    Extremity/Trunk Assessment   Upper Extremity Assessment Upper Extremity Assessment: Defer to OT evaluation    Lower Extremity Assessment Lower Extremity Assessment: Generalized weakness    Cervical / Trunk Assessment Cervical / Trunk Assessment: Kyphotic  Communication   Communication: No difficulties  Cognition Arousal/Alertness: Awake/alert Behavior During Therapy: WFL for tasks assessed/performed;Anxious Overall Cognitive Status: Impaired/Different from baseline Area of Impairment: Memory;Safety/judgement;Awareness                     Memory: Decreased short-term memory   Safety/Judgement: Decreased awareness of deficits;Decreased awareness of safety Awareness: Emergent   General Comments: pt repeats self several times throughout session. Question if this may be baseline for pt      General  Comments General comments (skin integrity, edema, etc.): daughter staying tonight but then pt will have only intermittent assist. Discussed pt asking other daughter to spend the night at her house as pain tends to be worse in the night and have safety concerns with pt    Exercises Other Exercises Other Exercises: abdominal setting in supine x10   Assessment/Plan    PT Assessment All further PT needs can be met in the next venue of care  PT Problem List Decreased strength;Decreased activity tolerance;Decreased balance;Decreased mobility;Decreased cognition;Decreased knowledge of use of DME;Decreased knowledge of precautions;Decreased safety awareness;Pain       PT Treatment Interventions      PT Goals (Current goals can be found in the Care Plan section)  Acute Rehab PT Goals Patient Stated Goal: pain control, be able to return home safely, have [place] therapy follow-up    Frequency     Barriers to discharge        Co-evaluation               AM-PAC PT "6 Clicks" Mobility  Outcome Measure Help needed turning from your back to your side while in a flat bed without using bedrails?: None Help needed moving from lying on your back to sitting on the side of a flat bed without using bedrails?: A Little Help needed moving to and from a bed to a chair (including a wheelchair)?: A Little Help needed standing up from a chair using your arms (e.g., wheelchair or bedside chair)?: A Little Help needed to walk in hospital room?:  A Little Help needed climbing 3-5 steps with a railing? : A Little 6 Click Score: 19    End of Session   Activity Tolerance: Patient limited by pain Patient left: in bed;with call bell/phone within reach Nurse Communication: Mobility status PT Visit Diagnosis: Unsteadiness on feet (R26.81);Pain;Muscle weakness (generalized) (M62.81) Pain - part of body:  (back)    Time: 4373-5789 PT Time Calculation (min) (ACUTE ONLY): 23 min   Charges:   PT  Evaluation $PT Eval Moderate Complexity: [place]  Pager [phone] Office [place] 04/21/2020, 10:27 AM

## 2020-04-23 ENCOUNTER — Encounter (HOSPITAL_COMMUNITY): Payer: Self-pay | Admitting: Neurosurgery

## 2020-04-23 ENCOUNTER — Telehealth: Payer: Self-pay | Admitting: Pharmacist

## 2020-04-23 DIAGNOSIS — N189 Chronic kidney disease, unspecified: Secondary | ICD-10-CM

## 2020-04-23 MED ORDER — RIVAROXABAN 15 MG PO TABS: 15.0000 mg | ORAL_TABLET | Freq: Every day | ORAL | 1 refills | Status: DC

## 2020-04-23 NOTE — Telephone Encounter (Signed)
Nurses please go ahead and order BMP for 3/28 visit reasonable chronic kidney disease, we will have patient get this drawn at the time of her visit

## 2020-04-23 NOTE — Telephone Encounter (Signed)
Based off of pt actual body weight creatine clearance (22 ml/min) she qualifies for a dose reduction of Xarelto. Even with her scr 6 months ago (0.87) she would still qualify for a dose reduction. Rx for Xarelto 15mg  sent to pharmacy and I called pharmacy and had then dc rx for Xarelto 20mg .   Called pt and explained the dose decrease. She was appreciative of the call. Recent increase in scr is probably due to surgery, but a repeat scr should be drawn to make sure crcl does not fall <15 (as xarelto would then be contraindicated).   D.Luk[place]- do you mind drawing a BMP at her apt with you on 3/28?

## 2020-04-23 NOTE — Anesthesia Postprocedure Evaluation (Signed)
Anesthesia Post Note  Patient: Katherine Lane  Procedure(s) Performed: Thoracic eleven, Thoracic twelve Kyphoplasty (N/A Back)     Patient location during evaluation: PACU Anesthesia Type: General Level of consciousness: awake and alert Pain management: pain level controlled Vital Signs Assessment: post-procedure vital signs reviewed and stable Respiratory status: spontaneous breathing, nonlabored ventilation, respiratory function stable and patient connected to nasal cannula oxygen Cardiovascular status: blood pressure returned to baseline and stable Postop Assessment: no apparent nausea or vomiting Anesthetic complications: no   No complications documented.  Last Vitals:  Vitals:   04/21/20 0351 04/21/20 0729  BP: 136/87 131/81  Pulse: (!) 57 76  Resp: 18 18  Temp: 36.4 C 36.7 C  SpO2: 96% 96%    Last Pain:  Vitals:   04/21/20 0729  TempSrc: Oral  PainSc:                  Marguerite Barba

## 2020-04-23 NOTE — Telephone Encounter (Signed)
Will do thanks (will keep you posted as well)

## 2020-04-23 NOTE — Telephone Encounter (Signed)
Order for bmp put in

## 2020-04-25 ENCOUNTER — Ambulatory Visit (HOSPITAL_COMMUNITY): Payer: Medicare PPO

## 2020-04-25 ENCOUNTER — Other Ambulatory Visit (HOSPITAL_COMMUNITY): Payer: Medicare PPO

## 2020-04-26 ENCOUNTER — Telehealth: Payer: Self-pay

## 2020-04-26 DIAGNOSIS — K5900 Constipation, unspecified: Secondary | ICD-10-CM | POA: Diagnosis not present

## 2020-04-26 DIAGNOSIS — F419 Anxiety disorder, unspecified: Secondary | ICD-10-CM | POA: Diagnosis not present

## 2020-04-26 DIAGNOSIS — M159 Polyosteoarthritis, unspecified: Secondary | ICD-10-CM | POA: Diagnosis not present

## 2020-04-26 DIAGNOSIS — I509 Heart failure, unspecified: Secondary | ICD-10-CM | POA: Diagnosis not present

## 2020-04-26 DIAGNOSIS — I110 Hypertensive heart disease with heart failure: Secondary | ICD-10-CM | POA: Diagnosis not present

## 2020-04-26 DIAGNOSIS — M4854XD Collapsed vertebra, not elsewhere classified, thoracic region, subsequent encounter for fracture with routine healing: Secondary | ICD-10-CM | POA: Diagnosis not present

## 2020-04-26 DIAGNOSIS — H9190 Unspecified hearing loss, unspecified ear: Secondary | ICD-10-CM | POA: Diagnosis not present

## 2020-04-26 DIAGNOSIS — I4891 Unspecified atrial fibrillation: Secondary | ICD-10-CM | POA: Diagnosis not present

## 2020-04-26 DIAGNOSIS — Z79899 Other long term (current) drug therapy: Secondary | ICD-10-CM | POA: Diagnosis not present

## 2020-04-26 NOTE — Telephone Encounter (Signed)
Verbal Orders left on Katherine Lane's vm.

## 2020-04-26 NOTE — Telephone Encounter (Signed)
It is okay to take the medications We certainly give approval for the therapy visits

## 2020-04-26 NOTE — Telephone Encounter (Signed)
Ben with [name] called for [facility] she recently had compression fracture she has level 2 Medication interaction with diltiazem (CARDIZEM CD) 120 MG 24 hr capsule and xarelto since March 7th it was 20 mg per discharge instructions cardiology called yesterday and changed to 15 mg tablet.  See if Dr Gerda Diss will approve  1 week 1,  2 week 4,  1 week 3   Ben needs call back to say it is ok to take meds and also approval of therapy. [phone]

## 2020-04-27 ENCOUNTER — Telehealth: Payer: Self-pay

## 2020-04-27 DIAGNOSIS — I509 Heart failure, unspecified: Secondary | ICD-10-CM | POA: Diagnosis not present

## 2020-04-27 DIAGNOSIS — I110 Hypertensive heart disease with heart failure: Secondary | ICD-10-CM | POA: Diagnosis not present

## 2020-04-27 DIAGNOSIS — H9190 Unspecified hearing loss, unspecified ear: Secondary | ICD-10-CM | POA: Diagnosis not present

## 2020-04-27 DIAGNOSIS — M159 Polyosteoarthritis, unspecified: Secondary | ICD-10-CM | POA: Diagnosis not present

## 2020-04-27 DIAGNOSIS — M4854XD Collapsed vertebra, not elsewhere classified, thoracic region, subsequent encounter for fracture with routine healing: Secondary | ICD-10-CM | POA: Diagnosis not present

## 2020-04-27 DIAGNOSIS — F419 Anxiety disorder, unspecified: Secondary | ICD-10-CM | POA: Diagnosis not present

## 2020-04-27 DIAGNOSIS — I4891 Unspecified atrial fibrillation: Secondary | ICD-10-CM | POA: Diagnosis not present

## 2020-04-27 DIAGNOSIS — K5900 Constipation, unspecified: Secondary | ICD-10-CM | POA: Diagnosis not present

## 2020-04-27 DIAGNOSIS — Z79899 Other long term (current) drug therapy: Secondary | ICD-10-CM | POA: Diagnosis not present

## 2020-04-27 NOTE — Telephone Encounter (Signed)
Please advise. Thank you

## 2020-04-27 NOTE — Telephone Encounter (Signed)
Michelle contacted and verbalized understanding  

## 2020-04-27 NOTE — Telephone Encounter (Signed)
Katherine Lane with [facility]  Therapy needs verbal orders   1x week for week 2x  Week for three weeks  1x week for two for Occupational therapy   Pt call back [phone] [facility]

## 2020-04-27 NOTE — Telephone Encounter (Signed)
Sure that would be fine

## 2020-04-30 DIAGNOSIS — M4854XD Collapsed vertebra, not elsewhere classified, thoracic region, subsequent encounter for fracture with routine healing: Secondary | ICD-10-CM | POA: Diagnosis not present

## 2020-04-30 DIAGNOSIS — I4891 Unspecified atrial fibrillation: Secondary | ICD-10-CM | POA: Diagnosis not present

## 2020-04-30 DIAGNOSIS — H9190 Unspecified hearing loss, unspecified ear: Secondary | ICD-10-CM | POA: Diagnosis not present

## 2020-04-30 DIAGNOSIS — M159 Polyosteoarthritis, unspecified: Secondary | ICD-10-CM | POA: Diagnosis not present

## 2020-04-30 DIAGNOSIS — K5900 Constipation, unspecified: Secondary | ICD-10-CM | POA: Diagnosis not present

## 2020-04-30 DIAGNOSIS — I110 Hypertensive heart disease with heart failure: Secondary | ICD-10-CM | POA: Diagnosis not present

## 2020-04-30 DIAGNOSIS — F419 Anxiety disorder, unspecified: Secondary | ICD-10-CM | POA: Diagnosis not present

## 2020-04-30 DIAGNOSIS — I509 Heart failure, unspecified: Secondary | ICD-10-CM | POA: Diagnosis not present

## 2020-04-30 DIAGNOSIS — Z79899 Other long term (current) drug therapy: Secondary | ICD-10-CM | POA: Diagnosis not present

## 2020-05-01 ENCOUNTER — Telehealth: Payer: Self-pay | Admitting: Cardiology

## 2020-05-01 DIAGNOSIS — M159 Polyosteoarthritis, unspecified: Secondary | ICD-10-CM | POA: Diagnosis not present

## 2020-05-01 DIAGNOSIS — K5900 Constipation, unspecified: Secondary | ICD-10-CM | POA: Diagnosis not present

## 2020-05-01 DIAGNOSIS — F419 Anxiety disorder, unspecified: Secondary | ICD-10-CM | POA: Diagnosis not present

## 2020-05-01 DIAGNOSIS — I509 Heart failure, unspecified: Secondary | ICD-10-CM | POA: Diagnosis not present

## 2020-05-01 DIAGNOSIS — Z79899 Other long term (current) drug therapy: Secondary | ICD-10-CM | POA: Diagnosis not present

## 2020-05-01 DIAGNOSIS — I4891 Unspecified atrial fibrillation: Secondary | ICD-10-CM | POA: Diagnosis not present

## 2020-05-01 DIAGNOSIS — I110 Hypertensive heart disease with heart failure: Secondary | ICD-10-CM | POA: Diagnosis not present

## 2020-05-01 DIAGNOSIS — M4854XD Collapsed vertebra, not elsewhere classified, thoracic region, subsequent encounter for fracture with routine healing: Secondary | ICD-10-CM | POA: Diagnosis not present

## 2020-05-01 DIAGNOSIS — H9190 Unspecified hearing loss, unspecified ear: Secondary | ICD-10-CM | POA: Diagnosis not present

## 2020-05-01 NOTE — Telephone Encounter (Signed)
New message    Pt c/o swelling: STAT is pt has developed SOB within 24 hours  How much weight have you gained and in what time span? no 1) If swelling, where is the swelling located? Feet and legs are swollen   2) Are you currently taking a fluid pill? Yes   3) Are you currently SOB? Just her normal sob with her CHF   4) Do you have a log of your daily weights (if so, list)? She is at 118 from 13 , she is usually at 116   5) Have you gained 3 pounds in a day or 5 pounds in a week? no  6) Have you traveled recently? No   Patient had back surgery last week and over the weekend she gained 5 lbs in 24 hours, [name] gave her the extra fluid pill and it brought her back down to 118, but Katherine Lane legs and feet are still really swollen, please advise how to proceed

## 2020-05-01 NOTE — Telephone Encounter (Signed)
Patient's daughter called to give an FYI on patient. Patient's weight is back to base line (116 lb), however patient is still experiencing some lower extremity edema. Patient's daughter verbalized to having patient elevate her legs when possible and limiting sodium intake. She also admitted that patient is taking 60 mg lasix daily and Metolazone 2.5 prn for weight gain. Patient is not having any CP or tightness in her chest area.

## 2020-05-01 NOTE — Telephone Encounter (Signed)
    If she is still having edema, can repeat using Metolazone 2.5mg  but would have her take an extra K-dur 20 mEq with this. Continue with Lasix at current dosing of 60mg  daily.   Signed, , PA-C 05/01/2020, 11:00 AM Pager: [phone]

## 2020-05-01 NOTE — Telephone Encounter (Signed)
Contacted patient with no answer. Left a message to call office back.

## 2020-05-01 NOTE — Telephone Encounter (Signed)
Contacted patient's daughter who verbalized understanding and will give Metolazone 2.5 mg for lower extremity edema along with an extra K-Dur 20 mEq tablet. She is aware to keep lasix dosing as is.

## 2020-05-02 ENCOUNTER — Telehealth: Payer: Self-pay | Admitting: Family Medicine

## 2020-05-02 DIAGNOSIS — F419 Anxiety disorder, unspecified: Secondary | ICD-10-CM | POA: Diagnosis not present

## 2020-05-02 DIAGNOSIS — I110 Hypertensive heart disease with heart failure: Secondary | ICD-10-CM | POA: Diagnosis not present

## 2020-05-02 DIAGNOSIS — Z79899 Other long term (current) drug therapy: Secondary | ICD-10-CM | POA: Diagnosis not present

## 2020-05-02 DIAGNOSIS — H9190 Unspecified hearing loss, unspecified ear: Secondary | ICD-10-CM | POA: Diagnosis not present

## 2020-05-02 DIAGNOSIS — I509 Heart failure, unspecified: Secondary | ICD-10-CM | POA: Diagnosis not present

## 2020-05-02 DIAGNOSIS — M4854XD Collapsed vertebra, not elsewhere classified, thoracic region, subsequent encounter for fracture with routine healing: Secondary | ICD-10-CM | POA: Diagnosis not present

## 2020-05-02 DIAGNOSIS — I4891 Unspecified atrial fibrillation: Secondary | ICD-10-CM | POA: Diagnosis not present

## 2020-05-02 DIAGNOSIS — M159 Polyosteoarthritis, unspecified: Secondary | ICD-10-CM | POA: Diagnosis not present

## 2020-05-02 DIAGNOSIS — K5900 Constipation, unspecified: Secondary | ICD-10-CM | POA: Diagnosis not present

## 2020-05-02 NOTE — Telephone Encounter (Signed)
Daughter declined appointment and stated the patient has a cardiologist that works with her regarding her swelling issues and fluid pills and they already have an appointment scheduled 05/14/20 for her regular check up and will just keep that appointment.

## 2020-05-02 NOTE — Telephone Encounter (Signed)
Unfortunately there is not anything I can do via phone Patient will need follow-up office visit either with myself or Clydie Braun preferably this week

## 2020-05-02 NOTE — Telephone Encounter (Signed)
FYIDarl Pikes from [facility] calling to report that patient is having back pain and swelling in both legs. Back pain is 9/10 and swelling is 2+ pitting. Back pain is one of home health vitals and it is out of perimeter. Pt has taken a muscle relaxer. Pt is keeping legs elevated.  Daughter to be called if any recommendations)

## 2020-05-03 ENCOUNTER — Ambulatory Visit: Payer: Medicare PPO | Admitting: Family Medicine

## 2020-05-04 DIAGNOSIS — M159 Polyosteoarthritis, unspecified: Secondary | ICD-10-CM | POA: Diagnosis not present

## 2020-05-04 DIAGNOSIS — I509 Heart failure, unspecified: Secondary | ICD-10-CM | POA: Diagnosis not present

## 2020-05-04 DIAGNOSIS — I4891 Unspecified atrial fibrillation: Secondary | ICD-10-CM | POA: Diagnosis not present

## 2020-05-04 DIAGNOSIS — F419 Anxiety disorder, unspecified: Secondary | ICD-10-CM | POA: Diagnosis not present

## 2020-05-04 DIAGNOSIS — H9190 Unspecified hearing loss, unspecified ear: Secondary | ICD-10-CM | POA: Diagnosis not present

## 2020-05-04 DIAGNOSIS — M4854XD Collapsed vertebra, not elsewhere classified, thoracic region, subsequent encounter for fracture with routine healing: Secondary | ICD-10-CM | POA: Diagnosis not present

## 2020-05-04 DIAGNOSIS — I110 Hypertensive heart disease with heart failure: Secondary | ICD-10-CM | POA: Diagnosis not present

## 2020-05-04 DIAGNOSIS — K5900 Constipation, unspecified: Secondary | ICD-10-CM | POA: Diagnosis not present

## 2020-05-04 DIAGNOSIS — Z79899 Other long term (current) drug therapy: Secondary | ICD-10-CM | POA: Diagnosis not present

## 2020-05-07 DIAGNOSIS — F419 Anxiety disorder, unspecified: Secondary | ICD-10-CM | POA: Diagnosis not present

## 2020-05-07 DIAGNOSIS — I4891 Unspecified atrial fibrillation: Secondary | ICD-10-CM | POA: Diagnosis not present

## 2020-05-07 DIAGNOSIS — I509 Heart failure, unspecified: Secondary | ICD-10-CM | POA: Diagnosis not present

## 2020-05-07 DIAGNOSIS — M4854XD Collapsed vertebra, not elsewhere classified, thoracic region, subsequent encounter for fracture with routine healing: Secondary | ICD-10-CM | POA: Diagnosis not present

## 2020-05-07 DIAGNOSIS — H9190 Unspecified hearing loss, unspecified ear: Secondary | ICD-10-CM | POA: Diagnosis not present

## 2020-05-07 DIAGNOSIS — M159 Polyosteoarthritis, unspecified: Secondary | ICD-10-CM | POA: Diagnosis not present

## 2020-05-07 DIAGNOSIS — Z79899 Other long term (current) drug therapy: Secondary | ICD-10-CM | POA: Diagnosis not present

## 2020-05-07 DIAGNOSIS — I110 Hypertensive heart disease with heart failure: Secondary | ICD-10-CM | POA: Diagnosis not present

## 2020-05-07 DIAGNOSIS — K5900 Constipation, unspecified: Secondary | ICD-10-CM | POA: Diagnosis not present

## 2020-05-08 ENCOUNTER — Other Ambulatory Visit: Payer: Self-pay | Admitting: Neurosurgery

## 2020-05-08 ENCOUNTER — Other Ambulatory Visit (HOSPITAL_COMMUNITY): Payer: Self-pay | Admitting: Neurosurgery

## 2020-05-08 DIAGNOSIS — S22080G Wedge compression fracture of T11-T12 vertebra, subsequent encounter for fracture with delayed healing: Secondary | ICD-10-CM | POA: Diagnosis not present

## 2020-05-08 DIAGNOSIS — S22070A Wedge compression fracture of T9-T10 vertebra, initial encounter for closed fracture: Secondary | ICD-10-CM | POA: Diagnosis not present

## 2020-05-08 DIAGNOSIS — M546 Pain in thoracic spine: Secondary | ICD-10-CM | POA: Diagnosis not present

## 2020-05-08 DIAGNOSIS — M5414 Radiculopathy, thoracic region: Secondary | ICD-10-CM | POA: Diagnosis not present

## 2020-05-09 ENCOUNTER — Ambulatory Visit (HOSPITAL_COMMUNITY)
Admission: RE | Admit: 2020-05-09 | Discharge: 2020-05-09 | Disposition: A | Discharge status: Home / Self Care | Payer: Medicare PPO | Source: Ambulatory Visit | Attending: Neurosurgery | Admitting: Neurosurgery

## 2020-05-09 ENCOUNTER — Other Ambulatory Visit: Payer: Self-pay | Admitting: Neurosurgery

## 2020-05-09 DIAGNOSIS — S22070A Wedge compression fracture of T9-T10 vertebra, initial encounter for closed fracture: Secondary | ICD-10-CM | POA: Insufficient documentation

## 2020-05-09 DIAGNOSIS — M546 Pain in thoracic spine: Secondary | ICD-10-CM | POA: Diagnosis not present

## 2020-05-09 DIAGNOSIS — M5414 Radiculopathy, thoracic region: Secondary | ICD-10-CM | POA: Diagnosis not present

## 2020-05-09 DIAGNOSIS — S22070D Wedge compression fracture of T9-T10 vertebra, subsequent encounter for fracture with routine healing: Secondary | ICD-10-CM | POA: Diagnosis not present

## 2020-05-11 DIAGNOSIS — M4854XD Collapsed vertebra, not elsewhere classified, thoracic region, subsequent encounter for fracture with routine healing: Secondary | ICD-10-CM | POA: Diagnosis not present

## 2020-05-11 DIAGNOSIS — M159 Polyosteoarthritis, unspecified: Secondary | ICD-10-CM | POA: Diagnosis not present

## 2020-05-11 DIAGNOSIS — I509 Heart failure, unspecified: Secondary | ICD-10-CM | POA: Diagnosis not present

## 2020-05-11 DIAGNOSIS — I110 Hypertensive heart disease with heart failure: Secondary | ICD-10-CM | POA: Diagnosis not present

## 2020-05-11 NOTE — H&P (Signed)
Patient ID:   [id number] Patient: Katherine Lane  Date of Birth: 1932/09/07 Visit Type: Office Visit   Date: 05/08/2020 03:30 PM Provider: Danae Orleans. Venetia Maxon MD   This 87 year old female presents for 1st Post Op.  HISTORY OF PRESENT ILLNESS: 1.  1st Post Op  04/20/2020 kyphoplasty T11 and T12  Patient returns reporting increasing pain mid back radiating around the chest wall with spasms that "take my breath away".  Physical therapy continues at home without significant improvement.  Medications are taken sparingly as she fears addiction and side effects; however she reports little benefit from either Robaxin or hydromorphone.  Robaxin 500 mg taken up to 1 or 2 per day, not daily Hydromorphone 2 mg taken up to 1 or 2 per day, not daily Tylenol OTC taken periodically  X-ray on canopy      Medical/Surgical/Interim History Reviewed, no change.  Last detailed document date:03/16/2017.     PAST MEDICAL HISTORY, SURGICAL HISTORY, FAMILY HISTORY, SOCIAL HISTORY AND REVIEW OF SYSTEMS I have reviewed the patient's past medical, surgical, family and social history as well as the comprehensive review of systems as included on the [facility] history form dated 09/21/2019, which I have signed.  Family History: Reviewed, no changes.  Last detailed document date:03/16/2017.   Social History: Reviewed, no changes. Last detailed document date: 03/16/2017.    MEDICATIONS: (added, continued or stopped this visit) Started Medication Directions Instruction Stopped  Ambien 10 mg tablet take 1 tablet by oral route  every day at bedtime    enalapril maleate 20 mg tablet take 1 tablet by oral route 2 times every day   03/01/2020 hydromorphone 2 mg tablet take 1/2 to 1 tablet by oral route  every 4 hours as needed  05/08/2020 05/08/2020 hydromorphone 2 mg tablet take 1 - 2 tablet(s) by oral route  every 4-6 hours as needed    lorazepam 0.5 mg  tablet take 1 tablet by oral route 3 times every day as needed    losartan 50 mg tablet take 1 tablet by oral route  every day   03/01/2020 methocarbamol 500 mg tablet take 1 tablet by oral route 3 times every day as needed    metoprolol tartrate 25 mg tablet take 1 tablet by oral route 2 times every day   05/13/2017 Neurontin 300 mg capsule take 1 capsule by oral route 3 times every day    verapamil ER (SR) 240 mg tablet,extended release take 1 tablet by oral route  every day with food    Xarelto 20 mg tablet take 1 tablet by oral route  every day with the evening meal   03/01/2020 Zofran 4 mg tablet take 1 tablet by oral route 3 times every day      ALLERGIES: Ingredient Reaction Medication Name Comment CODEINE Nausea/Vomiting    Reviewed, no changes.    PHYSICAL EXAM:  Vitals Date Temp F BP Pulse Ht In Wt Lb BMI BSA Pain Score 05/08/2020  123/77 85 63 116 20.55  8/10    DIAGNOSTIC RESULTS:  AP and lateral thoracic x-ray today reveals new T10 fracture.    IMPRESSION:  Katherine Lane returns in obvious discomfort, reporting severe midback pain with any movement.  PLAN: Consultation with Dr. Lorrine Kin who suggests increasing the hydromorphone as she currently reports no benefit with the 2 mg.  We will obtain a thoracic MRI on an expedited basis at [facility] and discuss findings over the phone given her difficulty with  mobility.  Will expect to plan for T10 kyphoplasty at [facility].  She will need to be off Xarelto for 3 days before surgery.  Refill is written today for hydromorphone 2 mg to be taken 1 or 2 tabs every 4-6 hours as needed.   Orders: Diagnostic Procedures: Assessment Procedure S22.070A MRI Spine/thor W/o Contrast  Assessment/Plan  # Detail Type Description  1. Assessment Compression fracture of T10 vertebra, initial encounter (S22.070A).     2. Assessment Thoracic spine pain  (M54.6).     3. Assessment Radiculopathy of thoracic region (M54.14).     4. Assessment Wedge comprsn fx T11-T12 vertebra, subs for fx w delay heal (S22.080G).          MEDICATIONS PRESCRIBED TODAY    Rx Quantity Refills HYDROMORPHONE HCL 2 mg  40 0           Provider:  Danae Orleans. Venetia Maxon MD  05/08/2020 04:41 PM    Dictation edited by: Oris Drone. Poteat RN    CC Providers: Ardyth Gal [address] [place],  [place]  [address]   Lilyan Punt  [facility] [address] [place], [place] [address]               Electronically signed by Danae Orleans. Venetia Maxon MD on 05/09/2020 01:22 PM

## 2020-05-14 ENCOUNTER — Encounter (HOSPITAL_COMMUNITY): Payer: Self-pay | Admitting: Neurosurgery

## 2020-05-14 ENCOUNTER — Ambulatory Visit: Payer: Medicare PPO | Admitting: Family Medicine

## 2020-05-14 NOTE — Progress Notes (Signed)
Spoke with pt's daughter, Dionicia Abler for pre-op call. DPR on file. Pt had surgery here less than a month ago. [name] states nothing has changed with pt's medical and surgical history. Pt is on Xarelto for A-fib. Last dose was 05/10/20 PM dose. Was instructed to hold 3 days prior to surgery.   Cardiac clearance in Epic on 04/10/20/  Covid test will be done day of surgery. Pt in a lot of pain especially with riding in car.

## 2020-05-15 ENCOUNTER — Ambulatory Visit (HOSPITAL_COMMUNITY): Payer: Medicare PPO | Admitting: Anesthesiology

## 2020-05-15 ENCOUNTER — Encounter (HOSPITAL_COMMUNITY)
Admission: RE | Disposition: A | Discharge status: Home / Self Care | Payer: Self-pay | Source: Ambulatory Visit | Attending: Neurosurgery

## 2020-05-15 ENCOUNTER — Ambulatory Visit (HOSPITAL_COMMUNITY): Payer: Medicare PPO

## 2020-05-15 ENCOUNTER — Observation Stay (HOSPITAL_COMMUNITY)
Admission: RE | Admit: 2020-05-15 | Discharge: 2020-05-16 | Disposition: A | Discharge status: Home / Self Care | Payer: Medicare PPO | Source: Ambulatory Visit | Attending: Neurosurgery | Admitting: Neurosurgery

## 2020-05-15 ENCOUNTER — Encounter (HOSPITAL_COMMUNITY): Payer: Self-pay | Admitting: Neurosurgery

## 2020-05-15 ENCOUNTER — Other Ambulatory Visit: Payer: Self-pay

## 2020-05-15 DIAGNOSIS — M546 Pain in thoracic spine: Secondary | ICD-10-CM | POA: Diagnosis not present

## 2020-05-15 DIAGNOSIS — M4134 Thoracogenic scoliosis, thoracic region: Secondary | ICD-10-CM | POA: Diagnosis not present

## 2020-05-15 DIAGNOSIS — M5414 Radiculopathy, thoracic region: Secondary | ICD-10-CM | POA: Insufficient documentation

## 2020-05-15 DIAGNOSIS — S22070A Wedge compression fracture of T9-T10 vertebra, initial encounter for closed fracture: Secondary | ICD-10-CM | POA: Diagnosis not present

## 2020-05-15 DIAGNOSIS — Z419 Encounter for procedure for purposes other than remedying health state, unspecified: Secondary | ICD-10-CM

## 2020-05-15 DIAGNOSIS — M8008XA Age-related osteoporosis with current pathological fracture, vertebra(e), initial encounter for fracture: Secondary | ICD-10-CM | POA: Diagnosis not present

## 2020-05-15 DIAGNOSIS — Z79899 Other long term (current) drug therapy: Secondary | ICD-10-CM | POA: Insufficient documentation

## 2020-05-15 DIAGNOSIS — M4844XA Fatigue fracture of vertebra, thoracic region, initial encounter for fracture: Secondary | ICD-10-CM | POA: Diagnosis not present

## 2020-05-15 DIAGNOSIS — S22009A Unspecified fracture of unspecified thoracic vertebra, initial encounter for closed fracture: Secondary | ICD-10-CM | POA: Diagnosis present

## 2020-05-15 DIAGNOSIS — I110 Hypertensive heart disease with heart failure: Secondary | ICD-10-CM | POA: Diagnosis not present

## 2020-05-15 DIAGNOSIS — Z20822 Contact with and (suspected) exposure to covid-19: Secondary | ICD-10-CM | POA: Diagnosis not present

## 2020-05-15 DIAGNOSIS — I5033 Acute on chronic diastolic (congestive) heart failure: Secondary | ICD-10-CM | POA: Diagnosis not present

## 2020-05-15 DIAGNOSIS — M4854XA Collapsed vertebra, not elsewhere classified, thoracic region, initial encounter for fracture: Secondary | ICD-10-CM | POA: Diagnosis not present

## 2020-05-15 DIAGNOSIS — I4892 Unspecified atrial flutter: Secondary | ICD-10-CM | POA: Diagnosis not present

## 2020-05-15 DIAGNOSIS — I480 Paroxysmal atrial fibrillation: Secondary | ICD-10-CM | POA: Diagnosis not present

## 2020-05-15 HISTORY — DX: Dyspnea, unspecified: R06.00

## 2020-05-15 HISTORY — PX: KYPHOPLASTY: SHX5884

## 2020-05-15 LAB — BASIC METABOLIC PANEL
Anion gap: 9 (ref 5–15)
BUN: 42 mg/dL — ABNORMAL HIGH (ref 8–23)
CO2: 25 mmol/L (ref 22–32)
Calcium: 9 mg/dL (ref 8.9–10.3)
Chloride: 105 mmol/L (ref 98–111)
Creatinine, Ser: 1.83 mg/dL — ABNORMAL HIGH (ref 0.44–1.00)
GFR, Estimated: 27 mL/min — ABNORMAL LOW (ref >60)
Glucose, Bld: 104 mg/dL — ABNORMAL HIGH (ref 70–99)
Potassium: 4.3 mmol/L (ref 3.5–5.1)
Sodium: 139 mmol/L (ref 135–145)

## 2020-05-15 LAB — CBC
HCT: 38.2 % (ref 36.0–46.0)
Hemoglobin: 11.1 g/dL — ABNORMAL LOW (ref 12.0–15.0)
MCH: 24.3 pg — ABNORMAL LOW (ref 26.0–34.0)
MCHC: 29.1 g/dL — ABNORMAL LOW (ref 30.0–36.0)
MCV: 83.6 fL (ref 80.0–100.0)
Platelets: 323 10*3/uL (ref 150–400)
RBC: 4.57 MIL/uL (ref 3.87–5.11)
RDW: 23.5 % — ABNORMAL HIGH (ref 11.5–15.5)
WBC: 5.5 10*3/uL (ref 4.0–10.5)
nRBC: 0.4 % — ABNORMAL HIGH (ref 0.0–0.2)

## 2020-05-15 LAB — SARS CORONAVIRUS 2 BY RT PCR (HOSPITAL ORDER, PERFORMED IN ~~LOC~~ HOSPITAL LAB): SARS Coronavirus 2: NEGATIVE

## 2020-05-15 SURGERY — KYPHOPLASTY
Anesthesia: General | Site: Spine Thoracic

## 2020-05-15 MED ORDER — FUROSEMIDE 40 MG PO TABS: 60.0000 mg | ORAL_TABLET | Freq: Every day | ORAL | Status: DC

## 2020-05-15 MED ORDER — METHOCARBAMOL 1000 MG/10ML IJ SOLN
500.0000 mg | Freq: Four times a day (QID) | INTRAVENOUS | Status: DC | PRN
  Filled 2020-05-15: qty 5

## 2020-05-15 MED ORDER — LIDOCAINE 2% (20 MG/ML) 5 ML SYRINGE
INTRAMUSCULAR | Status: AC
  Filled 2020-05-15: qty 5

## 2020-05-15 MED ORDER — GLYCOPYRROLATE PF 0.2 MG/ML IJ SOSY
PREFILLED_SYRINGE | INTRAMUSCULAR | Status: AC
  Filled 2020-05-15: qty 1

## 2020-05-15 MED ORDER — 0.9 % SODIUM CHLORIDE (POUR BTL) OPTIME
TOPICAL | Status: DC | PRN
  Administered 2020-05-15: 1000 mL

## 2020-05-15 MED ORDER — POLYVINYL ALCOHOL 1.4 % OP SOLN
1.0000 [drp] | Freq: Every day | OPHTHALMIC | Status: DC
  Administered 2020-05-15: 1 [drp] via OPHTHALMIC
  Filled 2020-05-15: qty 15

## 2020-05-15 MED ORDER — POLYETHYLENE GLYCOL 3350 17 G PO PACK: 17.0000 g | PACK | Freq: Every day | ORAL | Status: DC | PRN

## 2020-05-15 MED ORDER — HYDROCORTISONE (PERIANAL) 2.5 % EX CREA: 1.0000 "application " | TOPICAL_CREAM | Freq: Two times a day (BID) | CUTANEOUS | Status: DC | PRN

## 2020-05-15 MED ORDER — PHENYLEPHRINE IN HARD FAT 0.25 % RE SUPP
1.0000 | Freq: Two times a day (BID) | RECTAL | Status: DC | PRN
  Filled 2020-05-15: qty 1

## 2020-05-15 MED ORDER — FENTANYL CITRATE (PF) 250 MCG/5ML IJ SOLN
INTRAMUSCULAR | Status: AC
  Filled 2020-05-15: qty 5

## 2020-05-15 MED ORDER — SUGAMMADEX SODIUM 200 MG/2ML IV SOLN
INTRAVENOUS | Status: DC | PRN
  Administered 2020-05-15: 200 mg via INTRAVENOUS

## 2020-05-15 MED ORDER — LIDOCAINE-EPINEPHRINE 1 %-1:100000 IJ SOLN
INTRAMUSCULAR | Status: DC | PRN
  Administered 2020-05-15: 5 mL

## 2020-05-15 MED ORDER — ALUM & MAG HYDROXIDE-SIMETH 200-200-20 MG/5ML PO SUSP: 30.0000 mL | Freq: Four times a day (QID) | ORAL | Status: DC | PRN

## 2020-05-15 MED ORDER — FENTANYL CITRATE (PF) 100 MCG/2ML IJ SOLN
INTRAMUSCULAR | Status: DC | PRN
  Administered 2020-05-15: 25 ug via INTRAVENOUS
  Administered 2020-05-15: 50 ug via INTRAVENOUS

## 2020-05-15 MED ORDER — METOPROLOL SUCCINATE ER 100 MG PO TB24
100.0000 mg | ORAL_TABLET | Freq: Every day | ORAL | Status: DC
  Administered 2020-05-16: 100 mg via ORAL
  Filled 2020-05-15: qty 1

## 2020-05-15 MED ORDER — SODIUM CHLORIDE 0.9 % IV SOLN
250.0000 mL | INTRAVENOUS | Status: DC
  Administered 2020-05-15: 250 mL via INTRAVENOUS

## 2020-05-15 MED ORDER — PROPOFOL 10 MG/ML IV BOLUS
INTRAVENOUS | Status: AC
  Filled 2020-05-15: qty 20

## 2020-05-15 MED ORDER — BISACODYL 10 MG RE SUPP
10.0000 mg | Freq: Every day | RECTAL | Status: DC | PRN
Start: 2020-05-15 — End: 2020-05-16

## 2020-05-15 MED ORDER — BUPIVACAINE HCL (PF) 0.5 % IJ SOLN
INTRAMUSCULAR | Status: AC
  Filled 2020-05-15: qty 30

## 2020-05-15 MED ORDER — POLYETHYL GLYCOL-PROPYL GLYCOL 0.4-0.3 % OP SOLN: 1.0000 [drp] | Freq: Every day | OPHTHALMIC | Status: DC

## 2020-05-15 MED ORDER — POTASSIUM CHLORIDE ER 10 MEQ PO TBCR: 20.0000 meq | EXTENDED_RELEASE_TABLET | Freq: Every day | ORAL | Status: DC

## 2020-05-15 MED ORDER — PHENYLEPHRINE HCL-NACL 10-0.9 MG/250ML-% IV SOLN
INTRAVENOUS | Status: DC | PRN
  Administered 2020-05-15: 20 ug/min via INTRAVENOUS

## 2020-05-15 MED ORDER — MENTHOL 3 MG MT LOZG: 1.0000 | LOZENGE | OROMUCOSAL | Status: DC | PRN

## 2020-05-15 MED ORDER — CHLORHEXIDINE GLUCONATE CLOTH 2 % EX PADS: 6.0000 | MEDICATED_PAD | Freq: Once | CUTANEOUS | Status: DC

## 2020-05-15 MED ORDER — PANTOPRAZOLE SODIUM 40 MG PO TBEC
40.0000 mg | DELAYED_RELEASE_TABLET | Freq: Every day | ORAL | Status: DC
  Administered 2020-05-15: 40 mg via ORAL
  Filled 2020-05-15 (×2): qty 1

## 2020-05-15 MED ORDER — PANTOPRAZOLE SODIUM 40 MG IV SOLR: 40.0000 mg | Freq: Every day | INTRAVENOUS | Status: DC

## 2020-05-15 MED ORDER — LACTATED RINGERS IV SOLN: INTRAVENOUS | Status: DC

## 2020-05-15 MED ORDER — LORAZEPAM 0.5 MG PO TABS
0.5000 mg | ORAL_TABLET | Freq: Two times a day (BID) | ORAL | Status: DC | PRN
  Administered 2020-05-15: 0.5 mg via ORAL
  Filled 2020-05-15 (×2): qty 1

## 2020-05-15 MED ORDER — ONDANSETRON HCL 4 MG/2ML IJ SOLN
INTRAMUSCULAR | Status: AC
  Filled 2020-05-15: qty 2

## 2020-05-15 MED ORDER — FLUTICASONE PROPIONATE 50 MCG/ACT NA SUSP: 2.0000 | Freq: Every day | NASAL | Status: DC | PRN

## 2020-05-15 MED ORDER — ACETAMINOPHEN 325 MG PO TABS: 650.0000 mg | ORAL_TABLET | ORAL | Status: DC | PRN

## 2020-05-15 MED ORDER — METHOCARBAMOL 500 MG PO TABS
ORAL_TABLET | ORAL | Status: AC
  Filled 2020-05-15: qty 1

## 2020-05-15 MED ORDER — HYDROMORPHONE HCL 1 MG/ML IJ SOLN: 0.5000 mg | INTRAMUSCULAR | Status: DC | PRN

## 2020-05-15 MED ORDER — MAGNESIUM OXIDE 400 (241.3 MG) MG PO TABS
200.0000 mg | ORAL_TABLET | Freq: Every day | ORAL | Status: DC
  Administered 2020-05-15: 200 mg via ORAL
  Filled 2020-05-15 (×2): qty 1

## 2020-05-15 MED ORDER — CHLORHEXIDINE GLUCONATE 0.12 % MT SOLN
OROMUCOSAL | Status: AC
  Administered 2020-05-15: 15 mL via OROMUCOSAL
  Filled 2020-05-15: qty 15

## 2020-05-15 MED ORDER — ACETAMINOPHEN 325 MG PO TABS
650.0000 mg | ORAL_TABLET | ORAL | Status: DC | PRN
  Administered 2020-05-15: 650 mg via ORAL
  Filled 2020-05-15 (×2): qty 2

## 2020-05-15 MED ORDER — FENTANYL CITRATE (PF) 100 MCG/2ML IJ SOLN
25.0000 ug | INTRAMUSCULAR | Status: DC | PRN
  Administered 2020-05-15 (×2): 25 ug via INTRAVENOUS

## 2020-05-15 MED ORDER — ADULT MULTIVITAMIN W/MINERALS CH
1.0000 | ORAL_TABLET | Freq: Every day | ORAL | Status: DC
  Administered 2020-05-16: 1 via ORAL
  Filled 2020-05-15: qty 1

## 2020-05-15 MED ORDER — PROPOFOL 500 MG/50ML IV EMUL
INTRAVENOUS | Status: DC | PRN
  Administered 2020-05-15: 50 ug/kg/min via INTRAVENOUS

## 2020-05-15 MED ORDER — METHOCARBAMOL 500 MG PO TABS
500.0000 mg | ORAL_TABLET | Freq: Four times a day (QID) | ORAL | Status: DC | PRN
  Administered 2020-05-15 (×2): 500 mg via ORAL
  Filled 2020-05-15 (×3): qty 1

## 2020-05-15 MED ORDER — BUPIVACAINE HCL (PF) 0.5 % IJ SOLN
INTRAMUSCULAR | Status: DC | PRN
  Administered 2020-05-15: 5 mL

## 2020-05-15 MED ORDER — MAGNESIUM 200 MG PO TABS
200.0000 mg | ORAL_TABLET | Freq: Every day | ORAL | Status: DC
  Filled 2020-05-15: qty 1

## 2020-05-15 MED ORDER — DEXAMETHASONE SODIUM PHOSPHATE 10 MG/ML IJ SOLN
INTRAMUSCULAR | Status: DC | PRN
  Administered 2020-05-15: 8 mg via INTRAVENOUS

## 2020-05-15 MED ORDER — METHOCARBAMOL 500 MG PO TABS: 500.0000 mg | ORAL_TABLET | Freq: Three times a day (TID) | ORAL | Status: DC | PRN

## 2020-05-15 MED ORDER — SODIUM CHLORIDE 0.9% FLUSH: 3.0000 mL | INTRAVENOUS | Status: DC | PRN

## 2020-05-15 MED ORDER — FENTANYL CITRATE (PF) 100 MCG/2ML IJ SOLN
INTRAMUSCULAR | Status: AC
  Filled 2020-05-15: qty 2

## 2020-05-15 MED ORDER — ZOLPIDEM TARTRATE 5 MG PO TABS: 5.0000 mg | ORAL_TABLET | Freq: Every evening | ORAL | Status: DC | PRN

## 2020-05-15 MED ORDER — LIDOCAINE 2% (20 MG/ML) 5 ML SYRINGE
INTRAMUSCULAR | Status: DC | PRN
  Administered 2020-05-15: 60 mg via INTRAVENOUS

## 2020-05-15 MED ORDER — POLYETHYLENE GLYCOL 3350 17 GM/SCOOP PO POWD: 17.0000 g | Freq: Every day | ORAL | Status: DC | PRN

## 2020-05-15 MED ORDER — DEXAMETHASONE SODIUM PHOSPHATE 10 MG/ML IJ SOLN
INTRAMUSCULAR | Status: AC
  Filled 2020-05-15: qty 1

## 2020-05-15 MED ORDER — ONDANSETRON HCL 4 MG/2ML IJ SOLN
INTRAMUSCULAR | Status: DC | PRN
  Administered 2020-05-15: 4 mg via INTRAVENOUS

## 2020-05-15 MED ORDER — CEFAZOLIN SODIUM-DEXTROSE 2-4 GM/100ML-% IV SOLN
2.0000 g | INTRAVENOUS | Status: AC
  Administered 2020-05-15: 2 g via INTRAVENOUS
  Filled 2020-05-15: qty 100

## 2020-05-15 MED ORDER — ACETAMINOPHEN 500 MG PO TABS
1000.0000 mg | ORAL_TABLET | Freq: Once | ORAL | Status: AC
  Administered 2020-05-15: 1000 mg via ORAL
  Filled 2020-05-15: qty 2

## 2020-05-15 MED ORDER — LIDOCAINE-HYDROCORTISONE ACE 3-2.5 % RE KIT: 1.0000 "application " | PACK | Freq: Every day | RECTAL | Status: DC | PRN

## 2020-05-15 MED ORDER — ACETAMINOPHEN 650 MG RE SUPP: 650.0000 mg | RECTAL | Status: DC | PRN

## 2020-05-15 MED ORDER — FUROSEMIDE 40 MG PO TABS
60.0000 mg | ORAL_TABLET | Freq: Every day | ORAL | Status: DC
  Administered 2020-05-15 – 2020-05-16 (×2): 60 mg via ORAL
  Filled 2020-05-15 (×2): qty 1

## 2020-05-15 MED ORDER — HYDRALAZINE HCL 10 MG PO TABS
25.0000 mg | ORAL_TABLET | Freq: Three times a day (TID) | ORAL | Status: DC
  Administered 2020-05-15 – 2020-05-16 (×3): 25 mg via ORAL
  Filled 2020-05-15 (×4): qty 3

## 2020-05-15 MED ORDER — CHLORHEXIDINE GLUCONATE 0.12 % MT SOLN: 15.0000 mL | Freq: Once | OROMUCOSAL | Status: AC

## 2020-05-15 MED ORDER — CEFAZOLIN SODIUM-DEXTROSE 2-4 GM/100ML-% IV SOLN
2.0000 g | Freq: Three times a day (TID) | INTRAVENOUS | Status: AC
Start: 2020-05-15 — End: 2020-05-16
  Administered 2020-05-15 – 2020-05-16 (×2): 2 g via INTRAVENOUS
  Filled 2020-05-15 (×2): qty 100

## 2020-05-15 MED ORDER — PROPOFOL 10 MG/ML IV BOLUS
INTRAVENOUS | Status: DC | PRN
  Administered 2020-05-15: 100 mg via INTRAVENOUS

## 2020-05-15 MED ORDER — FLEET ENEMA 7-19 GM/118ML RE ENEM: 1.0000 | ENEMA | Freq: Once | RECTAL | Status: DC | PRN

## 2020-05-15 MED ORDER — HYDROMORPHONE HCL 2 MG PO TABS
2.0000 mg | ORAL_TABLET | ORAL | Status: DC | PRN
  Administered 2020-05-15 – 2020-05-16 (×2): 2 mg via ORAL
  Filled 2020-05-15 (×2): qty 1

## 2020-05-15 MED ORDER — LOSARTAN POTASSIUM 50 MG PO TABS
50.0000 mg | ORAL_TABLET | Freq: Two times a day (BID) | ORAL | Status: DC
  Administered 2020-05-15 – 2020-05-16 (×2): 50 mg via ORAL
  Filled 2020-05-15 (×3): qty 1

## 2020-05-15 MED ORDER — IOPAMIDOL (ISOVUE-300) INJECTION 61%
INTRAVENOUS | Status: DC | PRN
  Administered 2020-05-15: 50 mL

## 2020-05-15 MED ORDER — ROCURONIUM BROMIDE 10 MG/ML (PF) SYRINGE
PREFILLED_SYRINGE | INTRAVENOUS | Status: DC | PRN
  Administered 2020-05-15: 10 mg via INTRAVENOUS
  Administered 2020-05-15: 50 mg via INTRAVENOUS
  Administered 2020-05-15: 10 mg via INTRAVENOUS

## 2020-05-15 MED ORDER — POTASSIUM CHLORIDE CRYS ER 20 MEQ PO TBCR
10.0000 meq | EXTENDED_RELEASE_TABLET | Freq: Every day | ORAL | Status: DC
  Administered 2020-05-15: 10 meq via ORAL
  Filled 2020-05-15 (×2): qty 1

## 2020-05-15 MED ORDER — PE-SHARK LIVER OIL-COCOA BUTTR 0.25-3-85.5 % RE SUPP: 1.0000 | Freq: Two times a day (BID) | RECTAL | Status: DC | PRN

## 2020-05-15 MED ORDER — DILTIAZEM HCL ER COATED BEADS 120 MG PO CP24
120.0000 mg | ORAL_CAPSULE | Freq: Every day | ORAL | Status: DC
  Administered 2020-05-16: 120 mg via ORAL
  Filled 2020-05-15: qty 1

## 2020-05-15 MED ORDER — ROCURONIUM BROMIDE 10 MG/ML (PF) SYRINGE
PREFILLED_SYRINGE | INTRAVENOUS | Status: AC
  Filled 2020-05-15: qty 10

## 2020-05-15 MED ORDER — DOCUSATE SODIUM 100 MG PO CAPS
100.0000 mg | ORAL_CAPSULE | Freq: Two times a day (BID) | ORAL | Status: DC
  Administered 2020-05-15 – 2020-05-16 (×2): 100 mg via ORAL
  Filled 2020-05-15 (×3): qty 1

## 2020-05-15 MED ORDER — PHENOL 1.4 % MT LIQD: 1.0000 | OROMUCOSAL | Status: DC | PRN

## 2020-05-15 MED ORDER — METOLAZONE 2.5 MG PO TABS
2.5000 mg | ORAL_TABLET | ORAL | Status: DC | PRN
  Filled 2020-05-15: qty 1

## 2020-05-15 MED ORDER — POTASSIUM CHLORIDE CRYS ER 20 MEQ PO TBCR
20.0000 meq | EXTENDED_RELEASE_TABLET | Freq: Every day | ORAL | Status: DC
  Administered 2020-05-15 – 2020-05-16 (×2): 20 meq via ORAL
  Filled 2020-05-15 (×2): qty 1

## 2020-05-15 MED ORDER — ORAL CARE MOUTH RINSE: 15.0000 mL | Freq: Once | OROMUCOSAL | Status: AC

## 2020-05-15 MED ORDER — KCL IN DEXTROSE-NACL 20-5-0.45 MEQ/L-%-% IV SOLN: INTRAVENOUS | Status: DC

## 2020-05-15 MED ORDER — LIDOCAINE-EPINEPHRINE 1 %-1:100000 IJ SOLN
INTRAMUSCULAR | Status: AC
  Filled 2020-05-15: qty 1

## 2020-05-15 MED ORDER — SODIUM CHLORIDE 0.9% FLUSH
3.0000 mL | Freq: Two times a day (BID) | INTRAVENOUS | Status: DC
  Administered 2020-05-15 (×2): 3 mL via INTRAVENOUS

## 2020-05-15 SURGICAL SUPPLY — 31 items
BLADE SURG 15 STRL LF DISP TIS (BLADE) ×1 IMPLANT
BLADE SURG 15 STRL SS (BLADE) ×3
CEMENT BONE KYPHX HV R (Orthopedic Implant) ×3 IMPLANT
DERMABOND ADVANCED (GAUZE/BANDAGES/DRESSINGS) ×2
DERMABOND ADVANCED .7 DNX12 (GAUZE/BANDAGES/DRESSINGS) ×1 IMPLANT
DRAPE C-ARM 42X72 X-RAY (DRAPES) ×3 IMPLANT
DRAPE HALF SHEET 40X57 (DRAPES) ×3 IMPLANT
DRAPE LAPAROTOMY 100X72X124 (DRAPES) ×3 IMPLANT
DRAPE SURG 17X23 STRL (DRAPES) ×3 IMPLANT
DURAPREP 26ML APPLICATOR (WOUND CARE) ×3 IMPLANT
GAUZE 4X4 16PLY RFD (DISPOSABLE) ×3 IMPLANT
GLOVE BIO SURGEON STRL SZ8 (GLOVE) ×6 IMPLANT
GLOVE BIOGEL PI IND STRL 8.5 (GLOVE) ×2 IMPLANT
GLOVE BIOGEL PI INDICATOR 8.5 (GLOVE) ×4
GOWN STRL REUS W/ TWL LRG LVL3 (GOWN DISPOSABLE) IMPLANT
GOWN STRL REUS W/ TWL XL LVL3 (GOWN DISPOSABLE) ×1 IMPLANT
GOWN STRL REUS W/TWL 2XL LVL3 (GOWN DISPOSABLE) ×6 IMPLANT
GOWN STRL REUS W/TWL LRG LVL3 (GOWN DISPOSABLE)
GOWN STRL REUS W/TWL XL LVL3 (GOWN DISPOSABLE) ×3
KIT BASIN OR (CUSTOM PROCEDURE TRAY) ×3 IMPLANT
KIT TURNOVER KIT B (KITS) ×3 IMPLANT
NEEDLE HYPO 25X1 1.5 SAFETY (NEEDLE) ×3 IMPLANT
NS IRRIG 1000ML POUR BTL (IV SOLUTION) ×3 IMPLANT
PACK SURGICAL SETUP 50X90 (CUSTOM PROCEDURE TRAY) ×3 IMPLANT
STAPLER SKIN PROX WIDE 3.9 (STAPLE) ×3 IMPLANT
SUT VIC AB 3-0 SH 8-18 (SUTURE) ×3 IMPLANT
SYR CONTROL 10ML LL (SYRINGE) ×6 IMPLANT
TOWEL GREEN STERILE (TOWEL DISPOSABLE) ×3 IMPLANT
TOWEL GREEN STERILE FF (TOWEL DISPOSABLE) ×3 IMPLANT
TRAY KYPHOPAK 15/2 EXPRESS (KITS) ×3 IMPLANT
TRAY KYPHOPAK 20/3 ONESTEP 1ST (MISCELLANEOUS) IMPLANT

## 2020-05-15 NOTE — Transfer of Care (Signed)
Immediate Anesthesia Transfer of Care Note  Patient: Katherine Lane  Procedure(s) Performed: THORACIC NINE, THORACIC TEN  KYPHOPLASTY (N/A Spine Thoracic)  Patient Location: PACU  Anesthesia Type:General  Level of Consciousness: drowsy  Airway & Oxygen Therapy: Patient Spontanous Breathing and Patient connected to nasal cannula oxygen  Post-op Assessment: Report given to RN, Post -op Vital signs reviewed and stable and Patient moving all extremities  Post vital signs: Reviewed and stable  Last Vitals:  Vitals Value Taken Time  BP 116/88 05/15/20 1120  Temp 36.2 C 05/15/20 1050  Pulse 78 05/15/20 1124  Resp 22 05/15/20 1127  SpO2 99 % 05/15/20 1124  Vitals shown include unvalidated device data.  Last Pain:  Vitals:   05/15/20 1120  TempSrc:   PainSc: Asleep      Patients Stated Pain Goal: 3 (05/15/20 0727)  Complications: No complications documented.

## 2020-05-15 NOTE — Anesthesia Preprocedure Evaluation (Addendum)
Anesthesia Evaluation  Patient identified by MRN, date of birth, ID band Patient awake    Reviewed: Allergy & Precautions, NPO status , Patient's Chart, lab work & pertinent test results, reviewed documented beta blocker date and time   History of Anesthesia Complications (+) PONV and history of anesthetic complications  Airway Mallampati: II  TM Distance: >3 FB Neck ROM: Full    Dental  (+) Dental Advisory Given, Partial Upper, Partial Lower   Pulmonary shortness of breath,    Pulmonary exam normal breath sounds clear to auscultation       Cardiovascular hypertension, Pt. on medications and Pt. on home beta blockers +CHF  Normal cardiovascular exam+ dysrhythmias Atrial Fibrillation  Rhythm:Regular Rate:Normal     Neuro/Psych  Headaches, PSYCHIATRIC DISORDERS Anxiety Depression Compression fracture of Thoracic 9 vertebra  Neuromuscular disease    GI/Hepatic Neg liver ROS, GERD  ,  Endo/Other  negative endocrine ROS  Renal/GU negative Renal ROS     Musculoskeletal  (+) Arthritis ,   Abdominal   Peds  Hematology  (+) Blood dyscrasia (Xarelto), ,   Anesthesia Other Findings Day of surgery medications reviewed with the patient.  Reproductive/Obstetrics                            Anesthesia Physical Anesthesia Plan  ASA: III  Anesthesia Plan: General   Post-op Pain Management:    Induction: Intravenous  PONV Risk Score and Plan: 4 or greater and TIVA, Dexamethasone, Ondansetron, Propofol infusion and Scopolamine patch - Pre-op  Airway Management Planned: Oral ETT  Additional Equipment:   Intra-op Plan:   Post-operative Plan: Extubation in OR  Informed Consent: I have reviewed the patients History and Physical, chart, labs and discussed the procedure including the risks, benefits and alternatives for the proposed anesthesia with the patient or authorized representative who has  indicated his/her understanding and acceptance.     Dental advisory given  Plan Discussed with: CRNA  Anesthesia Plan Comments:        Anesthesia Quick Evaluation

## 2020-05-15 NOTE — Interval H&P Note (Signed)
History and Physical Interval Note:  05/15/2020 7:38 AM  Katherine Lane  has presented today for surgery, with the diagnosis of Compression fracture of Thoracic 9 vertebra.  The various methods of treatment have been discussed with the patient and family. After consideration of risks, benefits and other options for treatment, the patient has consented to  Procedure(s): Thoracic 9, Thoracic 10 KYPHOPLASTY (N/A) as a surgical intervention.  The patient's history has been reviewed, patient examined, no change in status, stable for surgery.  I have reviewed the patient's chart and labs.  Questions were answered to the patient's satisfaction.     Dorian Heckle

## 2020-05-15 NOTE — Progress Notes (Signed)
Awake, alert, conversant.  MAEW with good power 

## 2020-05-15 NOTE — Op Note (Signed)
05/15/2020  10:50 AM  PATIENT:  Katherine Lane  87 y.o. female  PRE-OPERATIVE DIAGNOSIS:  Compression fracture of Thoracic 9 and Thoracic 10 vertebrae   POST-OPERATIVE DIAGNOSIS:  Compression fracture of Thoracic 9 AND 10 vertebra  PROCEDURE:  Procedure(s): THORACIC NINE, THORACIC TEN  KYPHOPLASTY (N/A)  SURGEON:  Surgeon(s) and Role:    Maeola Harman, MD - Primary  PHYSICIAN ASSISTANT:   ASSISTANTS: none   ANESTHESIA:   general  EBL:  Minimal  BLOOD ADMINISTERED:none  DRAINS: none   LOCAL MEDICATIONS USED:  MARCAINE    and LIDOCAINE   SPECIMEN:  No Specimen  DISPOSITION OF SPECIMEN:  N/A  COUNTS:  YES  TOURNIQUET:  * No tourniquets in log *  DICTATION: Patient is 87 year old woman with scoliosis and severe back pain, who has now developed a painful T 9 and  T 10 compression fractures, which is proving debilitatingly painful to her.  It was elected to take her to surgery for kyphoplasty procedure.  PROCEDURE:  Following the smooth and uncomplicated induction of general endotracheal anesthesia, the patient was placed in a prone position on chest rolls.  C-arm fluoroscopy was positioned in both the AP and lateral planes, centered on the T 9 and T 10 vertebrae.  Her back was prepped and draped in the usual sterile fashion with Duraprep.  Using a right uni-pedicular approach, the T 9  pedicle and T 10 pedicles were entered with the trochar using standard landmarks and trochar was advanced into the vertebral bodies.  The drill was used, followed by a 15 cc Kyphon balloon at each level, which was used to re-expand the broken vertebra.  Subsequently, approximately 2.5   cc of bone cement was placed into the void created by the balloon at T 9 and was seen to fill each broken vertebra in both the AP and lateral direction with good interdigitation and without apparent extravasation. At T 10, after 1.5 cc bone cement, there was epidural spread, so fill was stopped at this point.  The  bone void fillers were then removed.  Final X-ray demonstrated good filling within the fractured vertebrae.  The incisions were closed with 3-0 vicryl stitches at each site and dressed with Dermabond. The patient was returned to the OR gurney and extubated in the OR and taken to Recovery in stable and satisfactory condition, having tolerated the procedure well.  Counts were correct at the end of the case.  PLAN OF CARE: Admit for overnight observation  PATIENT DISPOSITION:  PACU - hemodynamically stable.   Delay start of Pharmacological VTE agent (>24hrs) due to surgical blood loss or risk of bleeding: yes

## 2020-05-15 NOTE — Brief Op Note (Signed)
05/15/2020  10:50 AM  PATIENT:  Katherine Lane  87 y.o. female  PRE-OPERATIVE DIAGNOSIS:  Compression fracture of Thoracic 9 and Thoracic 10 vertebrae   POST-OPERATIVE DIAGNOSIS:  Compression fracture of Thoracic 9 AND 10 vertebra  PROCEDURE:  Procedure(s): THORACIC NINE, THORACIC TEN  KYPHOPLASTY (N/A)  SURGEON:  Surgeon(s) and Role:    * Analysse Quinonez, MD - Primary  PHYSICIAN ASSISTANT:   ASSISTANTS: none   ANESTHESIA:   general  EBL:  Minimal  BLOOD ADMINISTERED:none  DRAINS: none   LOCAL MEDICATIONS USED:  MARCAINE    and LIDOCAINE   SPECIMEN:  No Specimen  DISPOSITION OF SPECIMEN:  N/A  COUNTS:  YES  TOURNIQUET:  * No tourniquets in log *  DICTATION: Patient is 87 year old woman with scoliosis and severe back pain, who has now developed a painful T 9 and  T 10 compression fractures, which is proving debilitatingly painful to her.  It was elected to take her to surgery for kyphoplasty procedure.  PROCEDURE:  Following the smooth and uncomplicated induction of general endotracheal anesthesia, the patient was placed in a prone position on chest rolls.  C-arm fluoroscopy was positioned in both the AP and lateral planes, centered on the T 9 and T 10 vertebrae.  Her back was prepped and draped in the usual sterile fashion with Duraprep.  Using a right uni-pedicular approach, the T 9  pedicle and T 10 pedicles were entered with the trochar using standard landmarks and trochar was advanced into the vertebral bodies.  The drill was used, followed by a 15 cc Kyphon balloon at each level, which was used to re-expand the broken vertebra.  Subsequently, approximately 2.5   cc of bone cement was placed into the void created by the balloon at T 9 and was seen to fill each broken vertebra in both the AP and lateral direction with good interdigitation and without apparent extravasation. At T 10, after 1.5 cc bone cement, there was epidural spread, so fill was stopped at this point.  The  bone void fillers were then removed.  Final X-ray demonstrated good filling within the fractured vertebrae.  The incisions were closed with 3-0 vicryl stitches at each site and dressed with Dermabond. The patient was returned to the OR gurney and extubated in the OR and taken to Recovery in stable and satisfactory condition, having tolerated the procedure well.  Counts were correct at the end of the case.  PLAN OF CARE: Admit for overnight observation  PATIENT DISPOSITION:  PACU - hemodynamically stable.   Delay start of Pharmacological VTE agent (>24hrs) due to surgical blood loss or risk of bleeding: yes  

## 2020-05-15 NOTE — Anesthesia Postprocedure Evaluation (Signed)
Anesthesia Post Note  Patient: Katherine Lane  Procedure(s) Performed: THORACIC NINE, THORACIC TEN  KYPHOPLASTY (N/A Spine Thoracic)     Patient location during evaluation: PACU Anesthesia Type: General Level of consciousness: awake and alert Pain management: pain level controlled Vital Signs Assessment: post-procedure vital signs reviewed and stable Respiratory status: spontaneous breathing, nonlabored ventilation, respiratory function stable and patient connected to nasal cannula oxygen Cardiovascular status: blood pressure returned to baseline and stable Postop Assessment: no apparent nausea or vomiting Anesthetic complications: no   No complications documented.  Last Vitals:  Vitals:   05/15/20 1137 05/15/20 1208  BP: 119/75 102/80  Pulse: (!) 49 (!) 56  Resp: 18 18  Temp: 36.4 C   SpO2: 95% 98%    Last Pain:  Vitals:   05/15/20 1137  TempSrc:   PainSc: Asleep                 Cecile Hearing

## 2020-05-16 ENCOUNTER — Encounter (HOSPITAL_COMMUNITY): Payer: Self-pay | Admitting: Neurosurgery

## 2020-05-16 DIAGNOSIS — M546 Pain in thoracic spine: Secondary | ICD-10-CM | POA: Diagnosis not present

## 2020-05-16 DIAGNOSIS — Z79899 Other long term (current) drug therapy: Secondary | ICD-10-CM | POA: Diagnosis not present

## 2020-05-16 DIAGNOSIS — M4134 Thoracogenic scoliosis, thoracic region: Secondary | ICD-10-CM | POA: Diagnosis not present

## 2020-05-16 DIAGNOSIS — M4844XA Fatigue fracture of vertebra, thoracic region, initial encounter for fracture: Secondary | ICD-10-CM | POA: Diagnosis not present

## 2020-05-16 DIAGNOSIS — M5414 Radiculopathy, thoracic region: Secondary | ICD-10-CM | POA: Diagnosis not present

## 2020-05-16 DIAGNOSIS — Z20822 Contact with and (suspected) exposure to covid-19: Secondary | ICD-10-CM | POA: Diagnosis not present

## 2020-05-16 MED ORDER — HYDROMORPHONE HCL 2 MG PO TABS: 1.0000 mg | ORAL_TABLET | Freq: Two times a day (BID) | ORAL | 0 refills | Status: AC | PRN

## 2020-05-16 NOTE — Discharge Summary (Signed)
Physician Discharge Summary  Patient ID: Katherine Lane MRN: [id number] DOB/AGE: 1932-07-21 87 y.o.  Admit date: 05/15/2020 Discharge date: 05/16/2020  Admission Diagnoses:Compression fracture of Thoracic 9 AND 10 vertebra  Discharge Diagnoses: Compression fracture of Thoracic 9 AND 10 vertebra Active Problems:   Thoracic vertebral fracture [facility])   Discharged Condition: good  Hospital Course: The patient was admitted on 05/16/2020 and taken to the operating room where the patient underwent T9 and T10 kyphoplasty. The patient tolerated the procedure well and was taken to the recovery room and then to the floor in stable condition. The hospital course was routine. There were no complications. The wound remained clean dry and intact. Pt had appropriate back soreness. No complaints of leg pain or new N/T/W. The patient remained afebrile with stable vital signs, and tolerated a regular diet. The patient continued to increase activities, and pain was well controlled with oral pain medications.   Consults: None  Significant Diagnostic Studies: radiology: X-ray  Treatments: surgery: THORACIC NINE, THORACIC TEN  KYPHOPLASTY  Discharge Exam: Blood pressure 109/73, pulse 75, temperature 97.7 F (36.5 C), temperature source Oral, resp. rate 16, height 5' (1.524 m), weight 53.5 kg, SpO2 92 %.  Physical Exam: Patient is awake, A/O X 4, conversant, and in good spirits. Speech is fluent and appropriate. Doing well. MAEW with good strength that is symmetric bilaterally. 5/5 BUE/BLE. Dressing is clean dry intact. Incision is well approximated with no drainage, erythema, or edema.  Disposition:  Discharge disposition: 01-Home or Self Care        Allergies as of 05/16/2020      Reactions   Amlodipine Swelling   Leg swelling   Codeine Nausea And Vomiting, Other (See Comments)   Patient states "intolerance to all pain medications"  (NO OPIOIDS) VERY VIOLENT VOMITING!!   Other Nausea And  Vomiting, Other (See Comments)   general anesthesia   Tramadol Nausea And Vomiting, Other (See Comments)   "NO OPIOIDS!!! VERY VIOLENT VOMITING!!" per Med History prior to 02/18/17      Medication List    TAKE these medications   acetaminophen 325 MG tablet Commonly known as: TYLENOL Take 2 tablets (650 mg total) by mouth every 4 (four) hours as needed for mild pain ((score 1 to 3) or temp > 100.5).   diltiazem 120 MG 24 hr capsule Commonly known as: CARDIZEM CD TAKE (1) CAPSULE BY MOUTH ONCE DAILY. What changed:   how much to take  how to take this  when to take this  additional instructions   fluticasone 50 MCG/ACT nasal spray Commonly known as: FLONASE Place 2 sprays into both nostrils daily. What changed:   when to take this  reasons to take this   furosemide 40 MG tablet Commonly known as: LASIX Take 1.5 tablets (60 mg total) by mouth daily.   hydrALAZINE 25 MG tablet Commonly known as: APRESOLINE Take 1 tablet (25 mg total) by mouth 3 (three) times daily.   hydrocortisone 2.5 % rectal cream Commonly known as: ANUSOL-HC APPLY RECTALLY THREE TIMES DAILY AS DIRECTED What changed: See the new instructions.   HYDROmorphone 2 MG tablet Commonly known as: DILAUDID Take 2-3 mg by mouth every 6 (six) hours as needed for pain. What changed: Another medication with the same name was added. Make sure you understand how and when to take each.   HYDROmorphone 2 MG tablet Commonly known as: Dilaudid Take 0.5-1 tablets (1-2 mg total) by mouth every 12 (twelve) hours as needed for up to  5 days for severe pain. What changed: You were already taking a medication with the same name, and this prescription was added. Make sure you understand how and when to take each.   Lidocaine-Hydrocortisone Ace 3-2.5 % Kit APPLY TO RECTUM QID AS NEEDED FOR RECTAL PAIN OR BLEEDING What changed:   how much to take  how to take this  when to take this  reasons to take  this  additional instructions   LORazepam 0.5 MG tablet Commonly known as: ATIVAN Take 1 tablet (0.5 mg total) by mouth 2 (two) times daily as needed for anxiety or sleep. TAKE ONE TABLET BY MOUTH UP TO TWICE DAILY AS NEEDED. What changed: additional instructions   losartan 50 MG tablet Commonly known as: COZAAR Take 1 tablet (50 mg total) by mouth daily. What changed: when to take this   Lubricant Eye Drops 0.4-0.3 % Soln Generic drug: Polyethyl Glycol-Propyl Glycol Place 1 drop into both eyes at bedtime.   Magnesium 250 MG Tabs Take 250 mg by mouth at bedtime.   methocarbamol 500 MG tablet Commonly known as: ROBAXIN Take 1 tablet (500 mg total) by mouth every 8 (eight) hours as needed for muscle spasms.   metolazone 2.5 MG tablet Commonly known as: ZAROXOLYN Take 1 tablet (2.5 mg total) by mouth as needed (Take for weight gain > 2 lbs overnight or > 5 lbs in one week.).   metoprolol succinate 100 MG 24 hr tablet Commonly known as: TOPROL-XL TAKE ONE TABLET BY MOUTH ONCE DAILY.   multivitamin with minerals Tabs tablet Take 1 tablet by mouth daily. Centrum Silver   polyethylene glycol powder 17 GM/SCOOP powder Commonly known as: GLYCOLAX/MIRALAX Take 17 g daily as needed by mouth for moderate constipation.   potassium chloride 10 MEQ tablet Commonly known as: KLOR-CON Take 20 meq am and 10 mg pm What changed:   how much to take  how to take this  when to take this  additional instructions   Rivaroxaban 15 MG Tabs tablet Commonly known as: XARELTO Take 1 tablet (15 mg total) by mouth daily with supper.   shark liver oil-cocoa butter 0.25-3-85.5 % suppository Commonly known as: PREPARATION H Place 1 suppository rectally 2 (two) times daily as needed for hemorrhoids.   zolpidem 5 MG tablet Commonly known as: AMBIEN TAKE (1) TABLET BY MOUTH AT BEDTIME FOR SLEEP. What changed:   how much to take  how to take this  when to take this  reasons to take  this  additional instructions        Signed: Marvis Moeller, DNP, NP-C 05/16/2020, 9:53 AM

## 2020-05-16 NOTE — Discharge Instructions (Signed)
Wound Care Leave incision open to air. You may shower. Do not scrub directly on incision.  Do not put any creams, lotions, or ointments on incision. Activity Walk each and every day, increasing distance each day. No lifting greater than 5 lbs.  Avoid bending, Lifting, and twisting.  Diet Resume your normal diet.   Call Your Doctor If Any of These Occur Redness, drainage, or swelling at the wound.  Temperature greater than 101 degrees. Severe pain not relieved by pain medication. Incision starts to come apart.  Follow Up Appt Call today for appointment in 2 weeks ([phone]) or for problems.

## 2020-05-16 NOTE — TOC Initial Note (Signed)
Transition of Care [facility]) - Initial/Assessment Note    Patient Details  Name: Katherine Lane MRN: [id number] Date of Birth: 1932-07-22  Transition of Care [facility]) CM/SW Contact:    Joanne Chars, LCSW Phone Number: 05/16/2020, 10:27 AM  Clinical Narrative:  CSW met with pt regarding discharge plan.  Permission given to speak with pt daughter Shirlean Mylar.  Pt reports [place] services already in place, does not recall name of agency.  Pt agreeable to resume HH, choice document given. Pt reports she lives alone but has a [place] aide and good support from her daughter.  PCP in place.  Pt is vaccinated for covid, not boosted.    CSW spoke with daughter Shirlean Mylar.  Alvis Lemmings is current [facility] agency and she is agreeable to continue with them.   CSW confirmed resumption of services with Tommi Rumps from [facility] [place].                  Expected Discharge Plan: [place] Barriers to Discharge: No Barriers Identified   Patient Goals and CMS Choice   CMS Medicare.gov Compare Post Acute Care list provided to:: Patient Choice offered to / list presented to : Patient  Expected Discharge Plan and Services Expected Discharge Plan: [place] In-house Referral: Clinical Social Work   Post Acute Care Choice: [place] arrangements for the past 2 months: [place] Expected Discharge Date: 05/16/20               DME Arranged:  (DME by [facility] staff)           [place] Agency: [place] Date [place]: 05/16/20 Time HH Agency Contacted: 76 Representative spoke with at [place]: Tommi Rumps  Prior Living Arrangements/Services Living arrangements for the past 2 months: [place] Lives with:: Self Patient language and need for interpreter reviewed:: Yes Do you feel safe going back to the place where you live?: Yes      Need for Family Participation in Patient Care: Yes (Comment) Care giver support system in place?: Yes (comment) Current home services:  Homehealth aide,Home OT,Home PT,Home RN Criminal Activity/Legal Involvement Pertinent to Current Situation/Hospitalization: No - Comment as needed  Activities of Daily Living Home Assistive Devices/Equipment: Walker (specify type),Grab bars in shower ADL Screening (condition at time of admission) Patient's cognitive ability adequate to safely complete daily activities?: Yes Is the patient deaf or have difficulty hearing?: Yes Does the patient have difficulty seeing, even when wearing glasses/contacts?: Yes Does the patient have difficulty concentrating, remembering, or making decisions?: No Patient able to express need for assistance with ADLs?: Yes Does the patient have difficulty dressing or bathing?: Yes Independently performs ADLs?: No Communication: Needs assistance Is this a change from baseline?: Pre-admission baseline Dressing (OT): Needs assistance Is this a change from baseline?: Pre-admission baseline Grooming: Needs assistance Is this a change from baseline?: Pre-admission baseline Feeding: Independent Bathing: Needs assistance Is this a change from baseline?: Pre-admission baseline Toileting: Independent In/Out Bed: Needs assistance Is this a change from baseline?: Pre-admission baseline Walks in Home: Needs assistance Is this a change from baseline?: Pre-admission baseline Does the patient have difficulty walking or climbing stairs?: Yes Weakness of Legs: Both Weakness of Arms/Hands: Both  Permission Sought/Granted Permission sought to share information with : Family Supports Permission granted to share information with : Yes, Verbal Permission Granted  Share Information with NAME: daughter Shirlean Mylar           Emotional Assessment  Appearance:: Appears stated age Attitude/Demeanor/Rapport: Engaged Affect (typically observed): Appropriate,Pleasant Orientation: : Oriented to Self,Oriented to Place,Oriented to  Time,Oriented to Situation Alcohol / Substance Use: Not  Applicable Psych Involvement: No (comment)  Admission diagnosis:  Thoracic vertebral fracture [facility]) [S22.009A] Patient Active Problem List   Diagnosis Date Noted  . Thoracic vertebral fracture ([place]) 04/20/2020  . Chronic atrial fibrillation ([place]) 02/21/2020  . Anticoagulated 02/21/2020  . Hypokalemia 09/27/2019  . Bilateral pleural effusion 09/26/2019  . Age-related osteoporosis without current pathological fracture 06/10/2019  . Hemorrhoids, external, with complication 64/33/2951  . Rectal bleeding   . Rectal pain   . Hemorrhoids, internal, with bleeding 01/06/2018  . Hypertensive urgency 12/16/2017  . Intractable episodic headache   . Visual changes   . Depression with anxiety   . Chronic diastolic congestive heart failure ([place]) 10/31/2017  . Atypical chest pain 10/31/2017  . Compression fracture of L1 lumbar vertebra ([facility]) 02/19/2017  . Lumbar compression fracture ([place]) 12/26/2016  . Gastroesophageal reflux disease without esophagitis 07/18/2016  . Acute on chronic diastolic CHF (congestive heart failure) ([place]) 07/11/2016  . Chronic coughing 07/11/2016  . Migraine headache 07/01/2016  . Anxiety 06/30/2016  . Altered mental status 12/06/2015  . Prolonged Q-T interval on ECG 12/06/2015  . Postural dizziness with presyncope 12/06/2015  . PAF (paroxysmal atrial fibrillation) ([place]) 07/14/2013  . Atrial flutter ([place]) 05/12/2013  . Bulging discs 04/20/2013  . Trigeminal neuralgia 01/15/2013  . Cellulitis 07/31/2011  . Mechanical complication of internal orthopedic implant ([place]) 07/21/2011  . 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 Ankle fracture 07/02/2010  . LESION OF PLANTAR NERVE 05/14/2009  . CLOSED BIMALLEOLAR FRACTURE 03/01/2009  . Essential hypertension 11/07/2008  . CONSTIPATION, CHRONIC 11/07/2008  . Osteoporosis 11/07/2008   PCP:  Kathyrn Drown, MD Pharmacy:   [facility] [place], [place] [address] [place] [address] Phone: [phone] Fax: [phone]     Social Determinants of Health (SDOH) Interventions    Readmission Risk Interventions No flowsheet data found.

## 2020-05-16 NOTE — Progress Notes (Signed)
Subjective: Patient reports that she is doing well and has had a significant reduction in her preoperative pain. No acute events overnight.   Objective: Vital signs in last 24 hours: Temp:  [97.1 F (36.2 C)-97.9 F (36.6 C)] 97.7 F (36.5 C) (03/30 0715) Pulse Rate:  [49-80] 75 (03/30 0715) Resp:  [10-19] 16 (03/30 0715) BP: (85-119)/(62-88) 109/73 (03/30 0715) SpO2:  [89 %-100 %] 92 % (03/30 0715)  Intake/Output from previous day: 03/29 0701 - 03/30 0700 In: 800 [I.V.:800] Out: 20 [Blood:20] Intake/Output this shift: No intake/output data recorded.  Physical Exam: Patient is awake, A/O X 4, conversant, and in good spirits. Speech is fluent and appropriate. Doing well. MAEW with good strength that is symmetric bilaterally. 5/5 BUE/BLE. Dressing is clean dry intact. Incision is well approximated with no drainage, erythema, or edema.   Lab Results: Recent Labs    05/15/20 0703  WBC 5.5  HGB 11.1*  HCT 38.2  PLT 323   BMET Recent Labs    05/15/20 0703  NA 139  K 4.3  CL 105  CO2 25  GLUCOSE 104*  BUN 42*  CREATININE 1.83*  CALCIUM 9.0    Studies/Results: DG Thoracolumabar Spine  Result Date: 05/15/2020 CLINICAL DATA:  Elective surgery. Additional history provided: Thoracic 9, thoracic 10 kyphoplasty. Provided fluoroscopy time 1:40 2.5 seconds (25.07 mGy). EXAM: THORACOLUMBAR SPINE 1V COMPARISON:  MRI of the thoracic spine 05/09/2020. FINDINGS: AP and lateral view intraprocedural fluoroscopic images of the thoracic spine are submitted, 2 images total. The images demonstrate kyphoplasty material now present within the T9 and T10 vertebrae at site of known acute compression fractures. Redemonstrated sequela of prior augmentation of T11, T12 and L1 vertebral compression fractures. On the provided lateral view fluoroscopic image, there is extension of kyphoplasty material into the ventral aspect of the spinal canal at the T9-T10 levels. This was noted in the procedure note  in the EMR. IMPRESSION: Two intraprocedural fluoroscopic images of the thoracic spine from T9 and T10 kyphoplasty, as described. Kyphoplasty material is present within the ventral aspect of the spinal canal at T9-T10. This finding was noted in the procedural note in the EMR. Electronically Signed   By: Jackey Loge DO   On: 05/15/2020 11:22   DG C-Arm 1-60 Min  Result Date: 05/15/2020 CLINICAL DATA:  Elective surgery. Additional history provided: Thoracic 9, thoracic 10 kyphoplasty. Provided fluoroscopy time 1:40 2.5 seconds (25.07 mGy). EXAM: THORACOLUMBAR SPINE 1V COMPARISON:  MRI of the thoracic spine 05/09/2020. FINDINGS: AP and lateral view intraprocedural fluoroscopic images of the thoracic spine are submitted, 2 images total. The images demonstrate kyphoplasty material now present within the T9 and T10 vertebrae at site of known acute compression fractures. Redemonstrated sequela of prior augmentation of T11, T12 and L1 vertebral compression fractures. On the provided lateral view fluoroscopic image, there is extension of kyphoplasty material into the ventral aspect of the spinal canal at the T9-T10 levels. This was noted in the procedure note in the EMR. IMPRESSION: Two intraprocedural fluoroscopic images of the thoracic spine from T9 and T10 kyphoplasty, as described. Kyphoplasty material is present within the ventral aspect of the spinal canal at T9-T10. This finding was noted in the procedural note in the EMR. Electronically Signed   By: Jackey Loge DO   On: 05/15/2020 11:22    Assessment/Plan: Patient is post-op day 1 s/p T9 and T10 kyphoplasty. She is recovering well and reports a significant reduction of her preoperative symptoms.  Her only complaint is mild  incisional discomfort and mid back pain.  She has ambulated with PT who is recommending home health PT. Continue working on pain control, mobility and ambulating patient. Will plan for discharge today.    LOS: 0 days     Council Mechanic, DNP, NP-C 05/16/2020, 9:45 AM

## 2020-05-16 NOTE — Evaluation (Signed)
Physical Therapy Evaluation Patient Details Name: Katherine Lane MRN: [id number] DOB: [birth year] Today's Date: 05/16/2020   History of Present Illness  Pt is an 87 y/o female with chronic T3 fx, now s/p T9-T10 kyphoplasty on 05/15/2020. Other significant PMH includes vertibral artery dissection, Trigeminal neuralgia, osteoporosis, cervical fracture, a-fib, HTN, CHF, prior kyphoplasty x6 levels over 3 surgeries.  Clinical Impression  Pt admitted with above diagnosis. At the time of PT eval, pt was able to demonstrate transfers and ambulation with gross min guard assist and RW for support. Pt typically using rollator at home, and feel this will be appropriate to return to at d/c as pt can benefit from frequent seated rest breaks. Pt was educated on precautions, positioning recommendations, appropriate activity progression, and car transfer. Pt currently with functional limitations due to the deficits listed below (see PT Problem List). Pt will benefit from skilled PT to increase their independence and safety with mobility to allow discharge to the venue listed below.      Follow Up Recommendations Home health PT;Supervision/Assistance - 24 hour    Equipment Recommendations  None recommended by PT    Recommendations for Other Services       Precautions / Restrictions Precautions Precautions: Fall;Back Precaution Booklet Issued: Yes (comment) Precaution Comments: no brace needed per orders. Handout given by OT, reviewed again with pt Restrictions Weight Bearing Restrictions: No      Mobility  Bed Mobility Overal bed mobility: Needs Assistance Bed Mobility: Sidelying to Sit;Sit to Sidelying   Sidelying to sit: Supervision     Sit to sidelying: Min guard General bed mobility comments: No assist for transition to/from EOB. Pt required assist to adjust shoulder position and pillows once in sidelying however.    Transfers Overall transfer level: Needs assistance Equipment used:  Rolling walker (2 wheeled) Transfers: Sit to/from Stand Sit to Stand: Min guard         General transfer comment: Pt demonstrated proper hand placement on seated surface for safety. Increased time to power up to full standing position with RW for support upon stand.  Ambulation/Gait Ambulation/Gait assistance: Min guard Gait Distance (Feet): 100 Feet Assistive device: Rolling walker (2 wheeled) Gait Pattern/deviations: Step-through pattern;Decreased stride length;Trunk flexed Gait velocity: decreased Gait velocity interpretation: <1.31 ft/sec, indicative of household ambulator General Gait Details: VC's for improved posture, closer walker proximity, and forward gaze. No assist required. Pt fatiguing quickly and noted 3/4 DOE.  Stairs            Wheelchair Mobility    Modified Rankin (Stroke Patients Only)       Balance Overall balance assessment: Needs assistance Sitting-balance support: No upper extremity supported;Feet supported Sitting balance-Leahy Scale: Fair     Standing balance support: Single extremity supported;Bilateral upper extremity supported;During functional activity Standing balance-Leahy Scale: Fair Standing balance comment: recommend UE support for all dynamic activity                             Pertinent Vitals/Pain Pain Assessment: Faces Faces Pain Scale: Hurts little more Pain Location: back at surgical site Pain Descriptors / Indicators: Grimacing;Moaning;Sore;Operative site guarding Pain Intervention(s): Limited activity within patient's tolerance;Monitored during session;Repositioned    Home Living Family/patient expects to be discharged to:: Private residence Living Arrangements: Alone Available Help at Discharge: Family;Friend(s);Personal care attendant;Available PRN/intermittently (24 hours initially) Type of Home: House Home Access: Stairs to enter Entrance Stairs-Rails: Lawyer of Steps:  2 Home Layout: One  level Home Equipment: Cane - single point;Toilet riser;Shower seat;Bedside commode;Grab bars - tub/shower;Hand held shower head;Adaptive equipment;Other (comment);Walker - 4 wheels (has bedrail) Additional Comments: Pt has family, friends in/out throughout day to assist. Recently set up a PCA to assist 2-3 days week for 2 hours for showers, meals, etc.    Prior Function Level of Independence: Needs assistance   Gait / Transfers Assistance Needed: typically does not use AD for mobility but has recently been using rollator for mobility due to increasing pain  ADL's / Homemaking Assistance Needed: Hx of being able to dress, shower self but increasing difficulty due to pain. Recently hired PCA for 2-3 days/wk for 2 hours for showers, meal assist. Plans to sponge bathe on days aide does not assist, uses BSC at night 2-3x/night. Has meals on wheels. Family, friends in/out during the day to assist with meals, laundry, grocery shopping, etc        Hand Dominance   Dominant Hand: Right    Extremity/Trunk Assessment   Upper Extremity Assessment Upper Extremity Assessment: Defer to OT evaluation         Cervical / Trunk Assessment Cervical / Trunk Assessment: Kyphotic  Communication   Communication: No difficulties  Cognition Arousal/Alertness: Awake/alert Behavior During Therapy: WFL for tasks assessed/performed;Anxious Overall Cognitive Status: Impaired/Different from baseline Area of Impairment: Memory;Safety/judgement;Awareness                     Memory: Decreased short-term memory   Safety/Judgement: Decreased awareness of deficits;Decreased awareness of safety Awareness: Emergent   General Comments: pt repeats self several times throughout session. Question if this may be baseline for pt      General Comments      Exercises     Assessment/Plan    PT Assessment Patient needs continued PT services  PT Problem List Decreased strength;Decreased  activity tolerance;Decreased balance;Decreased mobility;Decreased cognition;Decreased knowledge of use of DME;Decreased knowledge of precautions;Decreased safety awareness;Pain       PT Treatment Interventions DME instruction;Stair training;Gait training;Functional mobility training;Therapeutic activities;Therapeutic exercise;Neuromuscular re-education;Patient/family education    PT Goals (Current goals can be found in the Care Plan section)  Acute Rehab PT Goals Patient Stated Goal: pain control, be able to return home safely, have HH therapy follow-up PT Goal Formulation: With patient Time For Goal Achievement: 05/23/20 Potential to Achieve Goals: Good    Frequency Min 5X/week   Barriers to discharge        Co-evaluation               AM-PAC PT "6 Clicks" Mobility  Outcome Measure Help needed turning from your back to your side while in a flat bed without using bedrails?: None Help needed moving from lying on your back to sitting on the side of a flat bed without using bedrails?: A Little Help needed moving to and from a bed to a chair (including a wheelchair)?: A Little Help needed standing up from a chair using your arms (e.g., wheelchair or bedside chair)?: A Little Help needed to walk in hospital room?: A Little Help needed climbing 3-5 steps with a railing? : A Little 6 Click Score: 19    End of Session Equipment Utilized During Treatment: Gait belt Activity Tolerance: Patient tolerated treatment well;Patient limited by fatigue Patient left: in bed;with call bell/phone within reach Nurse Communication: Mobility status PT Visit Diagnosis: Unsteadiness on feet (R26.81);Pain;Muscle weakness (generalized) (M62.81) Pain - part of body:  (back)    Time: 8413-2440 PT Time Calculation (min) (  ACUTE ONLY): 17 min   Charges:   PT Evaluation $PT Eval Moderate Complexity: 1 Mod          Conni Slipper, PT, DPT Acute Rehabilitation Services Pager:  [phone] Office: [phone]   Marylynn Pearson 05/16/2020, 9:42 AM

## 2020-05-16 NOTE — Evaluation (Signed)
Occupational Therapy Evaluation Patient Details Name: Katherine Lane MRN: [id number] DOB: 1932-08-27 Today's Date: 05/16/2020    History of Present Illness Pt is an 87 y/o female with chronic T3 fx, now s/p T9-T10 kyphoplasty on 05/15/2020. Other significant PMH includes vertibral artery dissection, Trigeminal neuralgia, osteoporosis, cervical fracture, a-fib, HTN, CHF, prior kyphoplasty x6 levels over 3 surgeries.   Clinical Impression   PTA patient was living alone in a private residence with assist from family and a PCA for 2hrs 2days/wk. Patient reports ambulating with use of rollator in home prior to admission with noted increased difficulty with self-care tasks with increased need for external assist. Patient currently presents below baseline level of function demonstrating need for Min guard grossly for sit to stand transfers for steadying and Mod A grossly for LB ADLs. OT provided education on spinal precautions, home set-up to maximize safety and independence with self-care tasks, and acquisition/use of AE. Patient would benefit from continued education and acute OT services while admitted.    Follow Up Recommendations  Home health OT;Supervision/Assistance - 24 hour    Equipment Recommendations  None recommended by OT    Recommendations for Other Services       Precautions / Restrictions Precautions Precautions: Fall;Back Precaution Booklet Issued: Yes (comment) Precaution Comments: no brace needed per orders. Handout given by OT, reviewed again with pt Restrictions Weight Bearing Restrictions: No      Mobility Bed Mobility Overal bed mobility: Needs Assistance Bed Mobility: Sit to Supine   Sidelying to sit: Supervision   Sit to supine: Min assist Sit to sidelying: Min guard General bed mobility comments: Min A to advance BLE from EOB to bed surface. Cues for positioning for adhernece to back precautions.    Transfers Overall transfer level: Needs  assistance Equipment used: Rolling walker (2 wheeled) Transfers: Sit to/from Stand Sit to Stand: Min guard         General transfer comment: Min guard for steadying with RW.    Balance Overall balance assessment: Needs assistance Sitting-balance support: No upper extremity supported;Feet supported Sitting balance-Leahy Scale: Fair     Standing balance support: Single extremity supported;Bilateral upper extremity supported;During functional activity Standing balance-Leahy Scale: Fair Standing balance comment: recommend UE support for all dynamic activity                           ADL either performed or assessed with clinical judgement   ADL Overall ADL's : Needs assistance/impaired Eating/Feeding: Independent;Sitting               Upper Body Dressing : Minimal assistance;Sitting   Lower Body Dressing: Moderate assistance;Sit to/from stand Lower Body Dressing Details (indicate cue type and reason): Assis to thread BLE seated on BSC. Patient able to hike pants over hips in standing with assist to steady. Cues for adherence to back precautions. Toilet Transfer: Min guard;Ambulation;RW;Comfort height toilet;Grab bars Toilet Transfer Details (indicate cue type and reason): Min guard for steadying. Cues for hand placement and walker management. Toileting- Clothing Manipulation and Hygiene: Minimal assistance;Sit to/from stand Toileting - Clothing Manipulation Details (indicate cue type and reason): Min A for hygiene/clothing management in standing. Steadying assist to prevent LOB.     Functional mobility during ADLs: Min guard;Rolling walker;Cueing for sequencing;Cueing for safety General ADL Comments: Patient greatly limited by pain in back especially with movement. Patient also limited by anxiety reporting fear of "this all happening again" referring to compression fracture.  Vision Baseline Vision/History: Wears glasses Wears Glasses: At all times Patient  Visual Report: No change from baseline Vision Assessment?: No apparent visual deficits     Perception     Praxis      Pertinent Vitals/Pain Pain Assessment: Faces Faces Pain Scale: Hurts whole lot Pain Location: back at surgical site Pain Descriptors / Indicators: Grimacing;Moaning;Sore;Operative site guarding Pain Intervention(s): Limited activity within patient's tolerance;Premedicated before session;Monitored during session;Repositioned     Hand Dominance Right   Extremity/Trunk Assessment Upper Extremity Assessment Upper Extremity Assessment: Generalized weakness   Lower Extremity Assessment Lower Extremity Assessment: Defer to PT evaluation   Cervical / Trunk Assessment Cervical / Trunk Assessment: Kyphotic   Communication Communication Communication: No difficulties   Cognition Arousal/Alertness: Awake/alert Behavior During Therapy: WFL for tasks assessed/performed;Anxious Overall Cognitive Status: No family/caregiver present to determine baseline cognitive functioning Area of Impairment: Memory;Safety/judgement;Awareness                     Memory: Decreased short-term memory   Safety/Judgement: Decreased awareness of deficits;Decreased awareness of safety Awareness: Emergent   General Comments: Patient requires increased time to process verbal information. Very anxious. Frequently repeats herself. Asks same questions several times.   General Comments  Patient with increased anxiety during session. Cues for relaxation techniques. Noted swelling in bilateral feet L>R.    Exercises     Shoulder Instructions      Home Living Family/patient expects to be discharged to:: Private residence Living Arrangements: Alone Available Help at Discharge: Family;Friend(s);Personal care attendant;Available PRN/intermittently (24 hours initially) Type of Home: House Home Access: Stairs to enter [employer] of Steps: 2 Entrance Stairs-Rails:  Left;Right Home Layout: One level     Bathroom Shower/Tub: Producer, television/film/video: Handicapped height     Home Equipment: Cane - single point;Toilet riser;Shower seat;Bedside commode;Grab bars - tub/shower;Hand held shower head;Adaptive equipment;Other (comment);Walker - 4 wheels (has bedrail) Adaptive Equipment: Reacher;Sock aid Additional Comments: Pt has family, friends in/out throughout day to assist. Recently set up a PCA to assist 2-3 days week for 2 hours for showers, meals, etc.      Prior Functioning/Environment Level of Independence: Needs assistance  Gait / Transfers Assistance Needed: typically does not use AD for mobility but has recently been using rollator for mobility due to increasing pain ADL's / Homemaking Assistance Needed: Hx of being able to dress, shower self but increasing difficulty due to pain. Recently hired PCA for 2-3 days/wk for 2 hours for showers, meal assist. Plans to sponge bathe on days aide does not assist, uses BSC at night 2-3x/night. Has meals on wheels. Family, friends in/out during the day to assist with meals, laundry, grocery shopping, etc            OT Problem List: Decreased strength;Decreased activity tolerance;Impaired balance (sitting and/or standing);Decreased knowledge of use of DME or AE;Decreased knowledge of precautions;Pain      OT Treatment/Interventions: Self-care/ADL training;Therapeutic exercise;Energy conservation;DME and/or AE instruction;Therapeutic activities;Cognitive remediation/compensation;Patient/family education;Balance training    OT Goals(Current goals can be found in the care plan section) Acute Rehab OT Goals Patient Stated Goal: To reduce pain. OT Goal Formulation: With patient/family Time For Goal Achievement: 05/30/20 Potential to Achieve Goals: Good ADL Goals Additional ADL Goal #1: Patient will complete LB ADLs with Min A, LRAD, and AE PRN. Additional ADL Goal #2: Patient will complete ADL  transfers with supervision A, LRAD, and good safety awareness. Additional ADL Goal #3: Patient/family will recall 3 energy conservation techniques  in prep for ADLs.  OT Frequency: Min 2X/week   Barriers to D/C:            Co-evaluation              AM-PAC OT "6 Clicks" Daily Activity     Outcome Measure Help from another person eating meals?: None Help from another person taking care of personal grooming?: A Little Help from another person toileting, which includes using toliet, bedpan, or urinal?: A Little Help from another person bathing (including washing, rinsing, drying)?: A Lot Help from another person to put on and taking off regular upper body clothing?: A Little Help from another person to put on and taking off regular lower body clothing?: A Lot 6 Click Score: 17   End of Session Equipment Utilized During Treatment: Rolling walker Nurse Communication: Mobility status;Other (comment)  Activity Tolerance: Patient limited by pain Patient left: in bed;with call bell/phone within reach;with family/visitor present  OT Visit Diagnosis: Unsteadiness on feet (R26.81);Other abnormalities of gait and mobility (R26.89);Muscle weakness (generalized) (M62.81);Pain Pain - part of body:  (back)                Time: 4166-0630 OT Time Calculation (min): 27 min Charges:  OT General Charges $OT Visit: 1 Visit OT Evaluation $OT Eval Moderate Complexity: 1 Mod OT Treatments $Self Care/Home Management : 8-22 mins  Drystan Reader H. OTR/L Supplemental OT, Department of rehab services [phone]  Andros Channing R H. 05/16/2020, 10:59 AM

## 2020-05-16 NOTE — Progress Notes (Signed)
Patient alert and oriented, voiding adequately, MAE well with no difficulty. Incision area cdi with no s/s of infection. Patient discharged home per order. Patient and daughter stated understanding of discharge instructions given. Patient has an appointment with Dr. Venetia Maxon

## 2020-05-18 ENCOUNTER — Telehealth: Payer: Self-pay

## 2020-05-18 DIAGNOSIS — K5900 Constipation, unspecified: Secondary | ICD-10-CM | POA: Diagnosis not present

## 2020-05-18 DIAGNOSIS — M159 Polyosteoarthritis, unspecified: Secondary | ICD-10-CM | POA: Diagnosis not present

## 2020-05-18 DIAGNOSIS — H9190 Unspecified hearing loss, unspecified ear: Secondary | ICD-10-CM | POA: Diagnosis not present

## 2020-05-18 DIAGNOSIS — I4891 Unspecified atrial fibrillation: Secondary | ICD-10-CM | POA: Diagnosis not present

## 2020-05-18 DIAGNOSIS — I509 Heart failure, unspecified: Secondary | ICD-10-CM | POA: Diagnosis not present

## 2020-05-18 DIAGNOSIS — F419 Anxiety disorder, unspecified: Secondary | ICD-10-CM | POA: Diagnosis not present

## 2020-05-18 DIAGNOSIS — Z79899 Other long term (current) drug therapy: Secondary | ICD-10-CM | POA: Diagnosis not present

## 2020-05-18 DIAGNOSIS — I110 Hypertensive heart disease with heart failure: Secondary | ICD-10-CM | POA: Diagnosis not present

## 2020-05-18 DIAGNOSIS — M4854XD Collapsed vertebra, not elsewhere classified, thoracic region, subsequent encounter for fracture with routine healing: Secondary | ICD-10-CM | POA: Diagnosis not present

## 2020-05-18 NOTE — Telephone Encounter (Signed)
Katherine Lane called [phone] second surgery in 30 days and she is having to do a follow up with Dr Lorin Picket and we need to know where he can fit her in the schedule for a follow up visit. Katherine Lane said that she will send a request through MyChart to him as well.   Pt call back is Katherine Lane [phone]

## 2020-05-21 DIAGNOSIS — I110 Hypertensive heart disease with heart failure: Secondary | ICD-10-CM | POA: Diagnosis not present

## 2020-05-21 DIAGNOSIS — I4891 Unspecified atrial fibrillation: Secondary | ICD-10-CM | POA: Diagnosis not present

## 2020-05-21 DIAGNOSIS — M4854XD Collapsed vertebra, not elsewhere classified, thoracic region, subsequent encounter for fracture with routine healing: Secondary | ICD-10-CM | POA: Diagnosis not present

## 2020-05-21 DIAGNOSIS — K5900 Constipation, unspecified: Secondary | ICD-10-CM | POA: Diagnosis not present

## 2020-05-21 DIAGNOSIS — I509 Heart failure, unspecified: Secondary | ICD-10-CM | POA: Diagnosis not present

## 2020-05-21 DIAGNOSIS — M159 Polyosteoarthritis, unspecified: Secondary | ICD-10-CM | POA: Diagnosis not present

## 2020-05-21 DIAGNOSIS — F419 Anxiety disorder, unspecified: Secondary | ICD-10-CM | POA: Diagnosis not present

## 2020-05-21 DIAGNOSIS — Z79899 Other long term (current) drug therapy: Secondary | ICD-10-CM | POA: Diagnosis not present

## 2020-05-21 DIAGNOSIS — H9190 Unspecified hearing loss, unspecified ear: Secondary | ICD-10-CM | POA: Diagnosis not present

## 2020-05-21 NOTE — Telephone Encounter (Signed)
So our goal is to help as much as possible It would be helpful to know a few details regarding when they need to be seen and specifics on the visit (as in : How can we help during that visit?) (The challenges as I am scheduled off next week and according to the current schedule I am booked up into May Knowing more details will help [place] figure out how to best help)

## 2020-05-21 NOTE — Telephone Encounter (Signed)
Patient has appointment for 11:20 on 4/26

## 2020-05-21 NOTE — Telephone Encounter (Signed)
Noted thank you

## 2020-05-21 NOTE — Telephone Encounter (Signed)
1120 appointment somewhere in April would be fine

## 2020-05-21 NOTE — Telephone Encounter (Signed)
Patient had her 3 month follow up scheduled last week but they had to cancel because had had surgery and she needs to reschedule that and also just update doctor on what she has had done recently.

## 2020-05-22 DIAGNOSIS — I509 Heart failure, unspecified: Secondary | ICD-10-CM | POA: Diagnosis not present

## 2020-05-22 DIAGNOSIS — M4854XD Collapsed vertebra, not elsewhere classified, thoracic region, subsequent encounter for fracture with routine healing: Secondary | ICD-10-CM | POA: Diagnosis not present

## 2020-05-22 DIAGNOSIS — H9190 Unspecified hearing loss, unspecified ear: Secondary | ICD-10-CM | POA: Diagnosis not present

## 2020-05-22 DIAGNOSIS — M159 Polyosteoarthritis, unspecified: Secondary | ICD-10-CM | POA: Diagnosis not present

## 2020-05-22 DIAGNOSIS — K5900 Constipation, unspecified: Secondary | ICD-10-CM | POA: Diagnosis not present

## 2020-05-22 DIAGNOSIS — Z79899 Other long term (current) drug therapy: Secondary | ICD-10-CM | POA: Diagnosis not present

## 2020-05-22 DIAGNOSIS — F419 Anxiety disorder, unspecified: Secondary | ICD-10-CM | POA: Diagnosis not present

## 2020-05-22 DIAGNOSIS — I4891 Unspecified atrial fibrillation: Secondary | ICD-10-CM | POA: Diagnosis not present

## 2020-05-22 DIAGNOSIS — I110 Hypertensive heart disease with heart failure: Secondary | ICD-10-CM | POA: Diagnosis not present

## 2020-05-25 ENCOUNTER — Telehealth: Payer: Self-pay | Admitting: Cardiology

## 2020-05-25 DIAGNOSIS — I4891 Unspecified atrial fibrillation: Secondary | ICD-10-CM | POA: Diagnosis not present

## 2020-05-25 DIAGNOSIS — H9190 Unspecified hearing loss, unspecified ear: Secondary | ICD-10-CM | POA: Diagnosis not present

## 2020-05-25 DIAGNOSIS — K5900 Constipation, unspecified: Secondary | ICD-10-CM | POA: Diagnosis not present

## 2020-05-25 DIAGNOSIS — M4854XD Collapsed vertebra, not elsewhere classified, thoracic region, subsequent encounter for fracture with routine healing: Secondary | ICD-10-CM | POA: Diagnosis not present

## 2020-05-25 DIAGNOSIS — I110 Hypertensive heart disease with heart failure: Secondary | ICD-10-CM | POA: Diagnosis not present

## 2020-05-25 DIAGNOSIS — Z79899 Other long term (current) drug therapy: Secondary | ICD-10-CM | POA: Diagnosis not present

## 2020-05-25 DIAGNOSIS — M159 Polyosteoarthritis, unspecified: Secondary | ICD-10-CM | POA: Diagnosis not present

## 2020-05-25 DIAGNOSIS — I509 Heart failure, unspecified: Secondary | ICD-10-CM | POA: Diagnosis not present

## 2020-05-25 DIAGNOSIS — F419 Anxiety disorder, unspecified: Secondary | ICD-10-CM | POA: Diagnosis not present

## 2020-05-25 NOTE — Telephone Encounter (Signed)
Spoke to patient daughter who stated that the patient is not having any SOB/CP but she does c/o lightheadedness and dizziness. Patients blood pressure has been running low the last couple of days. Daughter unable to provide a log. Daughter would like to know if she needs to hold hydralazine and what her baseline bp should be. Please advise

## 2020-05-25 NOTE — Telephone Encounter (Signed)
    Yes, with SBP in the 90's and associated dizziness, would recommend holding Hydralazine for now and following readings. By review of records she recently had thoracic kyphoplasty and if still on higher doses of pain medication, this could be contributing to her softer readings as well.  If BP continues to run soft, we may need to hold Losartan as well.  She is on Cardizem CD and Toprol-XL but these are for rate control given her atrial fibrillation. Ideally, her SBP should be greater than 100 but less than 140.  Signed, Ellsworth Lennox, PA-C 05/25/2020, 1:10 PM Pager: [phone]

## 2020-05-25 NOTE — Telephone Encounter (Signed)
Daughter [name] notified and voiced understanding to hold Hydralazine for now. Daughter states that she will monitor BP's and call if they are trending up.

## 2020-05-25 NOTE — Telephone Encounter (Signed)
Returned phone call to Dionicia Abler, daughter of patient. Left a message to return phone call to our office.

## 2020-05-25 NOTE — Telephone Encounter (Signed)
New message   Pt c/o BP issue: STAT if pt c/o blurred vision, one-sided weakness or slurred speech  1. What are your last 5 BP readings? 90/60   2. Are you having any other symptoms (ex. Dizziness, headache, blurred vision, passed out)? Dizzy when standing and moving   3. What is your BP issue? Should they hold the hydralazine ?

## 2020-05-29 ENCOUNTER — Telehealth: Payer: Self-pay | Admitting: *Deleted

## 2020-05-29 ENCOUNTER — Telehealth: Payer: Self-pay | Admitting: Student

## 2020-05-29 MED ORDER — HYDRALAZINE HCL 25 MG PO TABS: 25.0000 mg | ORAL_TABLET | Freq: Three times a day (TID) | ORAL | 3 refills | Status: DC

## 2020-05-29 MED ORDER — LOSARTAN POTASSIUM 50 MG PO TABS: 50.0000 mg | ORAL_TABLET | Freq: Every day | ORAL | 3 refills | Status: DC

## 2020-05-29 NOTE — Telephone Encounter (Signed)
Patient's daughter(Robin Vickki Hearing) called concerned about her mother. Her daughter had talked to Ashley/Brittany they changed her meds but she is still concerned bcc her BP is low and her feet still swollen. Her weight is curr 119.4lb her BP is 101/66 around 6-7am her heart rate is around 100. It been going on for a couple weeks and everything she eats is going through her. Drinks water and Boost. They had decreased some of her meds but the swelling and low BP is not changing. Please call [name] #[phone] as soon as you can.

## 2020-05-29 NOTE — Addendum Note (Signed)
Addended by: Kerney Elbe on: 05/29/2020 01:32 PM   Modules accepted: Orders

## 2020-05-29 NOTE — Telephone Encounter (Signed)
Pt's daughter [name] called states pt has been having low bp (99/66 this morning) constant diarrhea for a week, feeling much weaker today. Does not know if she could get her to an office without calling for an ambulance. She states she has called home health this morning to see if they could come out and is waiting to hear back from them. She wanted dr Lorin Picket to order some bw for home health to do today. Advised dr Lorin Picket is out of office this week and she would need to be seen by a provider and no available appts today and I advised her to go to urgent care or if not able to get her there to call ambulance to have her transported to hospital. She verbalized understanding and wanted a copy of this note sent to dr scott. ( she is aware dr Lorin Picket is out of office this week)

## 2020-05-29 NOTE — Telephone Encounter (Signed)
    Her low BP could be secondary to dehydration if she is having significant diarrhea. I recommend they reach out to her PCP for further evaluation of this. Would not titrate Lasix given concerns for dehydration and her poor PO intake. She is already holding Hydralazine based off our prior phone notes. Would hold Losartan as well for now. Continue Cardizem CD and Toprol-XL given her known atrial fibrillation.   Signed, Ellsworth Lennox, PA-C 05/29/2020, 12:48 PM

## 2020-05-29 NOTE — Telephone Encounter (Signed)
Daughter [name] notified and voiced understanding

## 2020-05-30 ENCOUNTER — Emergency Department (HOSPITAL_COMMUNITY): Payer: Medicare PPO

## 2020-05-30 ENCOUNTER — Observation Stay (HOSPITAL_BASED_OUTPATIENT_CLINIC_OR_DEPARTMENT_OTHER): Payer: Medicare PPO

## 2020-05-30 ENCOUNTER — Telehealth: Payer: Self-pay | Admitting: Student

## 2020-05-30 ENCOUNTER — Encounter (HOSPITAL_COMMUNITY): Payer: Self-pay

## 2020-05-30 ENCOUNTER — Observation Stay (HOSPITAL_COMMUNITY)
Admission: EM | Admit: 2020-05-30 | Discharge: 2020-06-01 | Disposition: A | Discharge status: Home / Self Care | Payer: Medicare PPO | Attending: Internal Medicine | Admitting: Internal Medicine

## 2020-05-30 ENCOUNTER — Other Ambulatory Visit: Payer: Self-pay

## 2020-05-30 DIAGNOSIS — Z7901 Long term (current) use of anticoagulants: Secondary | ICD-10-CM | POA: Diagnosis not present

## 2020-05-30 DIAGNOSIS — N1832 Chronic kidney disease, stage 3b: Secondary | ICD-10-CM | POA: Diagnosis not present

## 2020-05-30 DIAGNOSIS — Z723 Lack of physical exercise: Secondary | ICD-10-CM | POA: Diagnosis not present

## 2020-05-30 DIAGNOSIS — I5043 Acute on chronic combined systolic (congestive) and diastolic (congestive) heart failure: Secondary | ICD-10-CM | POA: Diagnosis not present

## 2020-05-30 DIAGNOSIS — R197 Diarrhea, unspecified: Secondary | ICD-10-CM | POA: Diagnosis not present

## 2020-05-30 DIAGNOSIS — R2689 Other abnormalities of gait and mobility: Secondary | ICD-10-CM | POA: Diagnosis not present

## 2020-05-30 DIAGNOSIS — Z20822 Contact with and (suspected) exposure to covid-19: Secondary | ICD-10-CM | POA: Insufficient documentation

## 2020-05-30 DIAGNOSIS — M545 Low back pain, unspecified: Secondary | ICD-10-CM | POA: Diagnosis not present

## 2020-05-30 DIAGNOSIS — I10 Essential (primary) hypertension: Secondary | ICD-10-CM

## 2020-05-30 DIAGNOSIS — D631 Anemia in chronic kidney disease: Secondary | ICD-10-CM

## 2020-05-30 DIAGNOSIS — Z79899 Other long term (current) drug therapy: Secondary | ICD-10-CM | POA: Diagnosis not present

## 2020-05-30 DIAGNOSIS — I482 Chronic atrial fibrillation, unspecified: Secondary | ICD-10-CM | POA: Diagnosis not present

## 2020-05-30 DIAGNOSIS — R34 Anuria and oliguria: Secondary | ICD-10-CM | POA: Insufficient documentation

## 2020-05-30 DIAGNOSIS — J9601 Acute respiratory failure with hypoxia: Secondary | ICD-10-CM | POA: Diagnosis not present

## 2020-05-30 DIAGNOSIS — I5033 Acute on chronic diastolic (congestive) heart failure: Secondary | ICD-10-CM | POA: Diagnosis present

## 2020-05-30 DIAGNOSIS — I130 Hypertensive heart and chronic kidney disease with heart failure and stage 1 through stage 4 chronic kidney disease, or unspecified chronic kidney disease: Principal | ICD-10-CM | POA: Insufficient documentation

## 2020-05-30 DIAGNOSIS — R531 Weakness: Secondary | ICD-10-CM | POA: Diagnosis not present

## 2020-05-30 DIAGNOSIS — I2693 Single subsegmental pulmonary embolism without acute cor pulmonale: Secondary | ICD-10-CM | POA: Diagnosis not present

## 2020-05-30 DIAGNOSIS — R600 Localized edema: Secondary | ICD-10-CM | POA: Insufficient documentation

## 2020-05-30 DIAGNOSIS — I110 Hypertensive heart disease with heart failure: Secondary | ICD-10-CM | POA: Diagnosis not present

## 2020-05-30 DIAGNOSIS — I5031 Acute diastolic (congestive) heart failure: Secondary | ICD-10-CM

## 2020-05-30 DIAGNOSIS — N183 Chronic kidney disease, stage 3 unspecified: Secondary | ICD-10-CM | POA: Diagnosis not present

## 2020-05-30 DIAGNOSIS — I2699 Other pulmonary embolism without acute cor pulmonale: Secondary | ICD-10-CM | POA: Diagnosis not present

## 2020-05-30 DIAGNOSIS — I517 Cardiomegaly: Secondary | ICD-10-CM | POA: Diagnosis not present

## 2020-05-30 LAB — URINALYSIS, ROUTINE W REFLEX MICROSCOPIC
Bilirubin Urine: NEGATIVE
Glucose, UA: NEGATIVE mg/dL
Hgb urine dipstick: NEGATIVE
Ketones, ur: NEGATIVE mg/dL
Leukocytes,Ua: NEGATIVE
Nitrite: NEGATIVE
Protein, ur: NEGATIVE mg/dL
Specific Gravity, Urine: 1.006 (ref 1.005–1.030)
pH: 5 (ref 5.0–8.0)

## 2020-05-30 LAB — COMPREHENSIVE METABOLIC PANEL
ALT: 20 U/L (ref 0–44)
AST: 33 U/L (ref 15–41)
Albumin: 3.5 g/dL (ref 3.5–5.0)
Alkaline Phosphatase: 139 U/L — ABNORMAL HIGH (ref 38–126)
Anion gap: 15 (ref 5–15)
BUN: 42 mg/dL — ABNORMAL HIGH (ref 8–23)
CO2: 21 mmol/L — ABNORMAL LOW (ref 22–32)
Calcium: 8.9 mg/dL (ref 8.9–10.3)
Chloride: 102 mmol/L (ref 98–111)
Creatinine, Ser: 1.69 mg/dL — ABNORMAL HIGH (ref 0.44–1.00)
GFR, Estimated: 29 mL/min — ABNORMAL LOW (ref >60)
Glucose, Bld: 96 mg/dL (ref 70–99)
Potassium: 3.7 mmol/L (ref 3.5–5.1)
Sodium: 138 mmol/L (ref 135–145)
Total Bilirubin: 1.6 mg/dL — ABNORMAL HIGH (ref 0.3–1.2)
Total Protein: 6.3 g/dL — ABNORMAL LOW (ref 6.5–8.1)

## 2020-05-30 LAB — RESP PANEL BY RT-PCR (FLU A&B, COVID) ARPGX2
Influenza A by PCR: NEGATIVE
Influenza B by PCR: NEGATIVE
SARS Coronavirus 2 by RT PCR: NEGATIVE

## 2020-05-30 LAB — CBC WITH DIFFERENTIAL/PLATELET
Abs Immature Granulocytes: 0.05 10*3/uL (ref 0.00–0.07)
Basophils Absolute: 0.1 10*3/uL (ref 0.0–0.1)
Basophils Relative: 1 %
Eosinophils Absolute: 0.1 10*3/uL (ref 0.0–0.5)
Eosinophils Relative: 2 %
HCT: 39.3 % (ref 36.0–46.0)
Hemoglobin: 11.5 g/dL — ABNORMAL LOW (ref 12.0–15.0)
Immature Granulocytes: 1 %
Lymphocytes Relative: 21 %
Lymphs Abs: 1.5 10*3/uL (ref 0.7–4.0)
MCH: 24.8 pg — ABNORMAL LOW (ref 26.0–34.0)
MCHC: 29.3 g/dL — ABNORMAL LOW (ref 30.0–36.0)
MCV: 84.7 fL (ref 80.0–100.0)
Monocytes Absolute: 0.9 10*3/uL (ref 0.1–1.0)
Monocytes Relative: 12 %
Neutro Abs: 4.7 10*3/uL (ref 1.7–7.7)
Neutrophils Relative %: 63 %
Platelets: 238 10*3/uL (ref 150–400)
RBC: 4.64 MIL/uL (ref 3.87–5.11)
RDW: 24.8 % — ABNORMAL HIGH (ref 11.5–15.5)
WBC: 7.4 10*3/uL (ref 4.0–10.5)
nRBC: 0.3 % — ABNORMAL HIGH (ref 0.0–0.2)

## 2020-05-30 LAB — ECHOCARDIOGRAM COMPLETE
Area-P 1/2: 4.44 cm2
Height: 60 in
S' Lateral: 1.68 cm
Weight: 1926.4 oz

## 2020-05-30 LAB — TROPONIN I (HIGH SENSITIVITY)
Troponin I (High Sensitivity): 21 ng/L — ABNORMAL HIGH (ref <18)
Troponin I (High Sensitivity): 21 ng/L — ABNORMAL HIGH (ref <18)

## 2020-05-30 LAB — LIPASE, BLOOD: Lipase: 54 U/L — ABNORMAL HIGH (ref 11–51)

## 2020-05-30 LAB — BRAIN NATRIURETIC PEPTIDE: B Natriuretic Peptide: 2187 pg/mL — ABNORMAL HIGH (ref 0.0–100.0)

## 2020-05-30 LAB — MAGNESIUM: Magnesium: 2.3 mg/dL (ref 1.7–2.4)

## 2020-05-30 MED ORDER — POLYVINYL ALCOHOL 1.4 % OP SOLN
1.0000 [drp] | Freq: Every day | OPHTHALMIC | Status: DC
  Administered 2020-05-30 – 2020-05-31 (×2): 1 [drp] via OPHTHALMIC
  Filled 2020-05-30: qty 15

## 2020-05-30 MED ORDER — POTASSIUM CHLORIDE CRYS ER 20 MEQ PO TBCR
20.0000 meq | EXTENDED_RELEASE_TABLET | Freq: Every day | ORAL | Status: DC
  Administered 2020-05-30 – 2020-06-01 (×3): 20 meq via ORAL
  Filled 2020-05-30 (×3): qty 1

## 2020-05-30 MED ORDER — ZOLPIDEM TARTRATE 5 MG PO TABS
5.0000 mg | ORAL_TABLET | Freq: Every evening | ORAL | Status: DC | PRN
  Administered 2020-05-30 – 2020-05-31 (×2): 5 mg via ORAL
  Filled 2020-05-30 (×2): qty 1

## 2020-05-30 MED ORDER — LOSARTAN POTASSIUM 50 MG PO TABS: 50.0000 mg | ORAL_TABLET | Freq: Every day | ORAL | Status: DC

## 2020-05-30 MED ORDER — SACCHAROMYCES BOULARDII 250 MG PO CAPS
250.0000 mg | ORAL_CAPSULE | Freq: Two times a day (BID) | ORAL | Status: DC
  Administered 2020-05-30 – 2020-06-01 (×5): 250 mg via ORAL
  Filled 2020-05-30 (×5): qty 1

## 2020-05-30 MED ORDER — FUROSEMIDE 10 MG/ML IJ SOLN
40.0000 mg | Freq: Two times a day (BID) | INTRAMUSCULAR | Status: DC
  Administered 2020-05-30 – 2020-05-31 (×2): 40 mg via INTRAVENOUS
  Filled 2020-05-30 (×2): qty 4

## 2020-05-30 MED ORDER — DILTIAZEM HCL ER COATED BEADS 120 MG PO CP24
120.0000 mg | ORAL_CAPSULE | Freq: Every day | ORAL | Status: DC
  Administered 2020-05-31 – 2020-06-01 (×2): 120 mg via ORAL
  Filled 2020-05-30 (×2): qty 1

## 2020-05-30 MED ORDER — FUROSEMIDE 10 MG/ML IJ SOLN
40.0000 mg | Freq: Once | INTRAMUSCULAR | Status: AC
  Administered 2020-05-30: 40 mg via INTRAVENOUS
  Filled 2020-05-30: qty 4

## 2020-05-30 MED ORDER — POLYETHYL GLYCOL-PROPYL GLYCOL 0.4-0.3 % OP SOLN: 1.0000 [drp] | Freq: Every day | OPHTHALMIC | Status: DC

## 2020-05-30 MED ORDER — METHOCARBAMOL 500 MG PO TABS: 500.0000 mg | ORAL_TABLET | Freq: Three times a day (TID) | ORAL | Status: DC | PRN

## 2020-05-30 MED ORDER — LOSARTAN POTASSIUM 50 MG PO TABS
25.0000 mg | ORAL_TABLET | Freq: Every day | ORAL | Status: DC
  Administered 2020-05-31 – 2020-06-01 (×2): 25 mg via ORAL
  Filled 2020-05-30 (×2): qty 1

## 2020-05-30 MED ORDER — DILTIAZEM HCL ER COATED BEADS 120 MG PO CP24: 120.0000 mg | ORAL_CAPSULE | Freq: Every day | ORAL | Status: DC

## 2020-05-30 MED ORDER — METOPROLOL SUCCINATE ER 50 MG PO TB24
100.0000 mg | ORAL_TABLET | Freq: Every day | ORAL | Status: DC
  Administered 2020-05-31 – 2020-06-01 (×2): 100 mg via ORAL
  Filled 2020-05-30 (×2): qty 2

## 2020-05-30 MED ORDER — ACETAMINOPHEN 325 MG PO TABS
650.0000 mg | ORAL_TABLET | ORAL | Status: DC | PRN
  Administered 2020-05-31: 650 mg via ORAL
  Filled 2020-05-30: qty 2

## 2020-05-30 MED ORDER — ONDANSETRON HCL 4 MG/2ML IJ SOLN: 4.0000 mg | Freq: Four times a day (QID) | INTRAMUSCULAR | Status: DC | PRN

## 2020-05-30 MED ORDER — HYDRALAZINE HCL 10 MG PO TABS: 20.0000 mg | ORAL_TABLET | Freq: Two times a day (BID) | ORAL | Status: DC

## 2020-05-30 MED ORDER — SODIUM CHLORIDE 0.9% FLUSH
3.0000 mL | Freq: Two times a day (BID) | INTRAVENOUS | Status: DC
  Administered 2020-05-30 – 2020-06-01 (×5): 3 mL via INTRAVENOUS

## 2020-05-30 MED ORDER — SODIUM CHLORIDE 0.9 % IV SOLN: 250.0000 mL | INTRAVENOUS | Status: DC | PRN

## 2020-05-30 MED ORDER — FLUTICASONE PROPIONATE 50 MCG/ACT NA SUSP: 2.0000 | Freq: Every day | NASAL | Status: DC | PRN

## 2020-05-30 MED ORDER — RIVAROXABAN 15 MG PO TABS
15.0000 mg | ORAL_TABLET | Freq: Every day | ORAL | Status: DC
  Administered 2020-05-30: 15 mg via ORAL
  Filled 2020-05-30 (×2): qty 1

## 2020-05-30 MED ORDER — SODIUM CHLORIDE 0.9% FLUSH: 3.0000 mL | INTRAVENOUS | Status: DC | PRN

## 2020-05-30 MED ORDER — HYDROMORPHONE HCL 2 MG PO TABS
2.0000 mg | ORAL_TABLET | Freq: Four times a day (QID) | ORAL | Status: DC | PRN
  Administered 2020-05-30 – 2020-06-01 (×4): 2 mg via ORAL
  Filled 2020-05-30 (×4): qty 1

## 2020-05-30 NOTE — H&P (Signed)
History and Physical    Katherine Lane VBT:[id number] DOB: [birth year] DOA: 05/30/2020  PCP: Kathyrn Drown, MD   Patient coming from: home  I have personally briefly reviewed patient's old medical records in [place]  Chief Complaint: SOB, increase LE swelling and weakness.  HPI: Katherine Lane is a 87 y.o. female with medical history significant of chronic atrial fibrillation, hypertension, chronic diastolic heart failure, chronic kidney disease a stage IIIb-IV (transition point) and chronic back pain (with recent back surgery, kyphoplasty procedure x2 during the month of March); daughter reported ongoing weakness, shortness of breath with minimal activity, weight gain of more than 5 pounds and lower extremity swelling.  There has not been any specific complaints of palpitations or chest pain.  She reported contacting cardiology service with recommendations for extra dose of Zaroxolyn which did not fix the problem.  Patient's daughter indicates increased lethargy, weakness and intermittent diarrhea for the last 2 weeks or so. Has not been any nausea, vomiting, abdominal pain, hematochezia, melena, dysuria, focal neurologic deficits, headaches or any other complaints.  Patient continued to have back pain, which according to patient's daughter is better than what it was before, still requiring as needed hydromorphone.  ED Course: Patient was found with elevated BNP, increased lower extremity swelling, borderline low oxygen saturation on room air 88-89% and using 2-3 L nasal cannula supplementation.  Troponin minimally elevated x2 in a flat fashion.  Case was discussed with cardiology service who recommended tune up admission for CHF exacerbation.  Review of Systems: As per HPI otherwise all other systems reviewed and are negative.   Past Medical History:  Diagnosis Date  . Anxiety   . Arthritis   . Atrial fibrillation ([place])   . CHF (congestive heart failure) ([place])   . Chronic back  pain   . Complication of anesthesia   . Constipation   . Depression   . Diastolic heart failure ([place])   . Dyspnea   . Dysrhythmia    AFib  . Essential hypertension   . History of blood transfusion   . History of cervical fracture   . Insomnia   . Migraine headache   . Osteoporosis   . Pneumonia   . PONV (postoperative nausea and vomiting)    "patch helped"  . Trigeminal neuralgia   . 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[facility])     Past Surgical History:  Procedure Laterality Date  . ANKLE FRACTURE SURGERY Right   . BACK SURGERY  2022  . CARDIAC CATHETERIZATION     denies  . CATARACT EXTRACTION W/PHACO Right 07/22/2012   Procedure: CATARACT EXTRACTION PHACO AND INTRAOCULAR LENS PLACEMENT (IOC);  Surgeon: Tonny Branch, MD;  Location: [facility];  Service: Ophthalmology;  Laterality: Right;  CDE:  18.93  . CATARACT EXTRACTION W/PHACO Left 08/09/2012   Procedure: CATARACT EXTRACTION PHACO AND INTRAOCULAR LENS PLACEMENT (IOC);  Surgeon: Tonny Branch, MD;  Location: [facility];  Service: Ophthalmology;  Laterality: Left;  CDE: 17.21  . COLONOSCOPY    . FLEXIBLE SIGMOIDOSCOPY N/A 11/19/2018   Procedure: FLEXIBLE SIGMOIDOSCOPY;  Surgeon: Danie Binder, MD;  [facility];  Service: Endoscopy;  Laterality: N/A;  7:30am  . HARDWARE REMOVAL  07/18/2011   Procedure: HARDWARE REMOVAL; Right leg-  Surgeon: Carole Civil, MD;  Location: [facility];  Service: Orthopedics;  Laterality: Right;  . HEMORRHOID BANDING N/A 11/19/2018   Procedure: HEMORRHOID BANDING;  Surgeon: Danie Binder, MD;  [facility];  Service: Endoscopy;  Laterality: N/A;  . KYPHOPLASTY N/A 12/26/2016   Procedure: Lumbar two Lumbar three and Lumbar five KYPHOPLASTY;  Surgeon: Erline Levine, MD;  Location: [place];  Service: Neurosurgery;  Laterality: N/A;  . KYPHOPLASTY N/A 02/19/2017   Procedure: KYPHOPLASTY LUMBAR ONE;  Surgeon: Erline Levine, MD;  Location: [place];  Service: Neurosurgery;  Laterality: N/A;  KYPHOPLASTY LUMBAR  ONE  . KYPHOPLASTY N/A 04/20/2020   Procedure: Thoracic eleven, Thoracic twelve Kyphoplasty;  Surgeon: Erline Levine, MD;  Location: [place];  Service: Neurosurgery;  Laterality: N/A;  . KYPHOPLASTY N/A 05/15/2020   Procedure: THORACIC NINE, THORACIC TEN  KYPHOPLASTY;  Surgeon: Erline Levine, MD;  Location: [place];  Service: Neurosurgery;  Laterality: N/A;  . NERVE REPAIR  07/18/2011   Procedure: NERVE REPAIR;  Surgeon: Carole Civil, MD;  [facility];  Service: Orthopedics;  Laterality: Right;  Right leg superficial peroneal nerve release    . PARTIAL HYSTERECTOMY      Social History  reports that she has never smoked. She has never used smokeless tobacco. She reports that she does not drink alcohol and does not use drugs.  Allergies  Allergen Reactions  . Amlodipine Swelling    Leg swelling   . Codeine Nausea And Vomiting and Other (See Comments)    Patient states "intolerance to all pain medications"  (NO OPIOIDS) VERY VIOLENT VOMITING!!  . Other Nausea And Vomiting and Other (See Comments)    general anesthesia  . Tramadol Nausea And Vomiting and Other (See Comments)    "NO OPIOIDS!!! VERY VIOLENT VOMITING!!" per Med History prior to 02/18/17    Family History  Problem Relation Age of Onset  . Hypertension Mother   . Alzheimer's disease Mother   . Cancer Mother   . Hypertension Father   . Other Father        abused pain meds  . Diabetes Brother   . Other Brother        back problems  . Diabetes Daughter   . Anesthesia problems Neg Hx   . Hypotension Neg Hx   . Malignant hyperthermia Neg Hx   . Pseudochol deficiency Neg Hx     Prior to Admission medications   Medication Sig Start Date End Date Taking? Authorizing Provider  acetaminophen (TYLENOL) 325 MG tablet Take 2 tablets (650 mg total) by mouth every 4 (four) hours as needed for mild pain ((score 1 to 3) or temp > 100.5). 04/21/20   Consuella Lose, MD  diltiazem (CARDIZEM CD) 120 MG 24 hr capsule TAKE (1)  CAPSULE BY MOUTH ONCE DAILY. Patient taking differently: Take 120 mg by mouth daily. 12/27/19   Satira Sark, MD  fluticasone (FLONASE) 50 MCG/ACT nasal spray Place 2 sprays into both nostrils daily. Patient taking differently: Place 2 sprays into both nostrils daily as needed (ears stopped it). 12/20/19   Kathyrn Drown, MD  furosemide (LASIX) 40 MG tablet Take 1.5 tablets (60 mg total) by mouth daily. 03/09/20 06/07/20  Satira Sark, MD  hydrALAZINE (APRESOLINE) 25 MG tablet Take 1 tablet (25 mg total) by mouth 3 (three) times daily. Hold 05/29/20 08/27/20  Strader, Fransisco Hertz, PA-C  hydrocortisone (ANUSOL-HC) 2.5 % rectal cream APPLY RECTALLY THREE TIMES DAILY AS DIRECTED Patient taking differently: Place 1 application rectally 2 (two) times daily as needed for hemorrhoids or anal itching. 10/19/18   Mikey Kirschner, MD  HYDROmorphone (DILAUDID) 2 MG tablet Take 2-3 mg by mouth every 6 (six) hours as needed for pain.  05/08/20   [provider]  Lidocaine-Hydrocortisone Ace 3-2.5 % KIT APPLY TO RECTUM QID AS NEEDED FOR RECTAL PAIN OR BLEEDING Patient taking differently: Place 1 application rectally daily as needed (rectal pain or bleeding). 05/12/19   Fields, Marga Melnick, MD  LORazepam (ATIVAN) 0.5 MG tablet Take 1 tablet (0.5 mg total) by mouth 2 (two) times daily as needed for anxiety or sleep. TAKE ONE TABLET BY MOUTH UP TO TWICE DAILY AS NEEDED. Patient taking differently: Take 0.5 mg by mouth 2 (two) times daily as needed for anxiety or sleep. 12/14/19   Kathyrn Drown, MD  losartan (COZAAR) 50 MG tablet Take 1 tablet (50 mg total) by mouth daily. Hold 05/29/20 08/27/20  Strader, Fransisco Hertz, PA-C  Magnesium 250 MG TABS Take 250 mg by mouth at bedtime.     [provider]  methocarbamol (ROBAXIN) 500 MG tablet Take 1 tablet (500 mg total) by mouth every 8 (eight) hours as needed for muscle spasms. 04/21/20   Consuella Lose, MD  metolazone (ZAROXOLYN) 2.5 MG tablet Take 1  tablet (2.5 mg total) by mouth as needed (Take for weight gain > 2 lbs overnight or > 5 lbs in one week.). 04/12/20 07/11/20  Strader, Fransisco Hertz, PA-C  metoprolol succinate (TOPROL-XL) 100 MG 24 hr tablet TAKE ONE TABLET BY MOUTH ONCE DAILY. Patient taking differently: Take 100 mg by mouth daily. 03/29/20   Satira Sark, MD  Multiple Vitamin (MULTIVITAMIN WITH MINERALS) TABS tablet Take 1 tablet by mouth daily. Centrum Silver    [provider]  Polyethyl Glycol-Propyl Glycol (LUBRICANT EYE DROPS) 0.4-0.3 % SOLN Place 1 drop into both eyes at bedtime.    [provider]  polyethylene glycol powder (GLYCOLAX/MIRALAX) 17 GM/SCOOP powder Take 17 g daily as needed by mouth for moderate constipation.     [provider]  potassium chloride (KLOR-CON) 10 MEQ tablet Take 20 meq am and 10 mg pm Patient taking differently: Take 10-20 mEq by mouth See admin instructions. Take 20 meq am and 10 meg at lunch 03/02/20   Satira Sark, MD  Rivaroxaban (XARELTO) 15 MG TABS tablet Take 1 tablet (15 mg total) by mouth daily with supper. 04/23/20   Satira Sark, MD  shark liver oil-cocoa butter (PREPARATION H) 0.25-3-85.5 % suppository Place 1 suppository rectally 2 (two) times daily as needed for hemorrhoids.     [provider]  zolpidem (AMBIEN) 5 MG tablet TAKE (1) TABLET BY MOUTH AT BEDTIME FOR SLEEP. Patient taking differently: Take 5 mg by mouth at bedtime as needed for sleep. 12/14/19   Kathyrn Drown, MD    Physical Exam: Vitals:   05/30/20 1015 05/30/20 1030 05/30/20 1111 05/30/20 1130  BP:  113/75 116/73 112/82  Pulse:  (!) 102 (!) 108 78  Resp:  (!) 26 (!) 31 (!) 33  Temp:      TempSrc:      SpO2: 90% 90% 92% 97%  Weight:      Height:        Constitutional: Frail, weak and chronically ill in appearance; denying chest pain, nausea or vomiting.  2 L nasal cannula palpitations reported.  No complaining of chest pain or palpitations at this  time. Vitals:   05/30/20 1015 05/30/20 1030 05/30/20 1111 05/30/20 1130  BP:  113/75 116/73 112/82  Pulse:  (!) 102 (!) 108 78  Resp:  (!) 26 (!) 31 (!) 33  Temp:      TempSrc:  SpO2: 90% 90% 92% 97%  Weight:      Height:       Eyes: PERRL, lids and conjunctivae normal; no icterus. ENMT: Mucous membranes are moist. Posterior pharynx clear of any exudate or lesions. No thrush. Neck: normal, supple, no masses, no thyromegaly no JVD on exam. Respiratory: No wheezing, positive scattered rhonchi.  Decreased breath sounds at the bases.  No frank crackles.  2 L nasal cannula supplementation in place. Cardiovascular: Irregular, no rubs, no gallops, no JVD on exam.  2+ edema bilaterally appreciated. Abdomen: no tenderness, no masses palpated. No hepatosplenomegaly. Bowel sounds positive.  Musculoskeletal: no cyanosis or clubbing; overall good range of motion and normal muscular tone. Skin: no rashes, lesions, ulcers. No induration Neurologic: CN 2-12 grossly intact. Sensation intact, DTR normal.  Muscle strength 3-4/5 in the setting of poor effort and ongoing back pain. Psychiatric: Normal judgment and insight. Normal mood.    Labs on Admission: I have personally reviewed following labs and imaging studies  CBC: Recent Labs  Lab 05/30/20 0941  WBC 7.4  NEUTROABS 4.7  HGB 11.5*  HCT 39.3  MCV 84.7  PLT 165    Basic Metabolic Panel: Recent Labs  Lab 05/30/20 0941  NA 138  K 3.7  CL 102  CO2 21*  GLUCOSE 96  BUN 42*  CREATININE 1.69*  CALCIUM 8.9    GFR: Estimated Creatinine Clearance: 18.9 mL/min (A) (by C-G formula based on SCr of 1.69 mg/dL (H)).  Liver Function Tests: Recent Labs  Lab 05/30/20 0941  AST 33  ALT 20  ALKPHOS 139*  BILITOT 1.6*  PROT 6.3*  ALBUMIN 3.5    Urine analysis:    Component Value Date/Time   COLORURINE STRAW (A) 05/30/2020 0941   APPEARANCEUR CLEAR 05/30/2020 0941   APPEARANCEUR Cloudy (A) 06/20/2016 1400   LABSPEC 1.006  05/30/2020 0941   PHURINE 5.0 05/30/2020 0941   GLUCOSEU NEGATIVE 05/30/2020 0941   HGBUR NEGATIVE 05/30/2020 0941   BILIRUBINUR NEGATIVE 05/30/2020 0941   BILIRUBINUR Negative 06/20/2016 1400   KETONESUR NEGATIVE 05/30/2020 0941   PROTEINUR NEGATIVE 05/30/2020 0941   UROBILINOGEN 0.2 03/17/2016 1043   UROBILINOGEN 0.2 02/15/2009 1217   NITRITE NEGATIVE 05/30/2020 0941   LEUKOCYTESUR NEGATIVE 05/30/2020 0941    Radiological Exams on Admission: DG Chest Portable 1 View  Result Date: 05/30/2020 CLINICAL DATA:  Weakness. EXAM: PORTABLE CHEST 1 VIEW COMPARISON:  12/30/2019 and older studies. FINDINGS: Mild stable enlargement of the cardiopericardial silhouette. No mediastinal or hilar masses. Prominent interstitial markings. Calcification noted in the right lower hemithorax. These findings are stable. No evidence of pneumonia or pulmonary edema. No convincing pleural effusion and no pneumothorax. Skeletal structures are demineralized. There are multiple old vertebral fractures with 7 visualized treated with previous vertebroplasty. IMPRESSION: 1. No acute cardiopulmonary disease. 2. Chronic interstitial prominence in the lungs. Stable mild cardiomegaly. Electronically Signed   By: Lajean Manes M.D.   On: 05/30/2020 10:12    EKG: Independently reviewed.   Assessment/Plan 1-mild acute on chronic diastolic heart failure -Follow-up results of 2D echo -Continue low-sodium diet -Follow daily weight and history does arouse -Continue treatment with IV Lasix at least overnight to further stabilize her volume. -TED hoses has been ordered. -Cardiology was consulted and will follow recommendations.  2-chronic atrial fibrillation -Rate controlled overall -Continue the use of Cardizem, metoprolol and Xarelto for secondary prevention.  3-chronic back pain -Is status post kyphoplasty x2 -Continue as needed analgesics -PT evaluation requested.  4-chronic kidney disease stage IIIb-IV -  Follow  renal function trend -Close monitoring of patient's electrolytes -Minimize nephrotoxic agents.  5-hypertension -Blood pressure overall is stable to soft range as per daughters reports. -Will hold hydralazine and cut Cozaar dose in half. -Follow vital signs  6-history of diarrhea -No nausea, no vomiting, no abdominal pain. -No recent use of antibiotics -Doubt C. difficile in nature -Stool studies ordered by EDP at time of admission. -Florastor twice a day has been ordered -Holding prior to admission Lasix drip regimen -Maintain adequate hydration and encourage patient to eat.  7-insomnia -Continue as needed Ambien.    DVT prophylaxis: Chronically on Xarelto Code Status:   Full code Family Communication:  Daughter at bedside. Disposition Plan:   Patient is from: Home  Anticipated DC to: To be determined.  Anticipated DC date:  05/31/20  Anticipated DC barriers: Volume stabilization and improvement in her shortness of breath and weakness. Consults called:  Cardiology service. Admission status:  Observation, length of stay less than 2 midnights; telemetry bed.  Severity of Illness: Mild severity; patient presenting with mild CHF exacerbation, weakness and lower extremity swelling.  She is about 5 pounds weight gain from her baseline.  Borderline oxygen desaturation while in the ED.  Has been admitted for IV fluids, repeat echo and further evaluation of acute on chronic CHF exacerbation.  Barton Dubois MD [facility]  How to contact the [facility] Attending or Consulting provider [place] or covering provider during after hours [place], for this patient?   1. Check the care team in [facility] and look for a) attending/consulting [facility] provider listed and b) the [facility] team listed 2. Log into [email or web address] and use [place]'s universal password to access. If you do not have the password, please contact the hospital operator. 3. Locate the [facility] provider you are looking for under [facility] and page to a number that you can be directly reached. 4. If you still have difficulty reaching the provider, please page the [facility] (Director on Call) for the Hospitalists listed on amion for assistance.  05/30/2020, 12:36 PM

## 2020-05-30 NOTE — ED Provider Notes (Addendum)
[facility] EMERGENCY DEPARTMENT Provider Note   CSN: [id number] Arrival date & time: 05/30/20  0901     History Chief Complaint  Patient presents with  . Diarrhea    Katherine Lane is a 87 y.o. female.  HPI      Katherine Lane is a 87 y.o. female with past medical history of CHF, atrial fibrillation, hypertension anticoagulated on Xarelto who presents to the Emergency Department complaining of diarrhea, generalized weakness and swelling of her feet and lower legs.  She is 2-1/2 weeks status post T11 and 12 kyphoplasty on 04/20/2020 and kyphoplasty of the T9 and 10 vertebrae on 05/15/2020.  Since her last kyphoplasty procedure patient is daughter notes increasing weakness and swelling of her legs and feet.  She takes furosemide daily, but daughter endorses 4 to 5 pound weight gain this week.  Patient is also had numerous episodes of loose stools several times throughout the day.  Nonbloody or black in appearance.  She contacted patient's cardiologist office this morning and was advised to come to the emergency department for further evaluation.  Patient's daughter denies fever, chills.  patient denies chest pain, abdominal pain, or shortness of breath.  Daughter states that she was given IV antibiotics prior to her procedure but has not been on any oral antibiotics.   Past Medical History:  Diagnosis Date  . Anxiety   . Arthritis   . Atrial fibrillation ([place])   . CHF (congestive heart failure) ([place])   . Chronic back pain   . Complication of anesthesia   . Constipation   . Depression   . Diastolic heart failure ([place])   . Dyspnea   . Dysrhythmia    AFib  . Essential hypertension   . History of blood transfusion   . History of cervical fracture   . Insomnia   . Migraine headache   . Osteoporosis   . Pneumonia   . PONV (postoperative nausea and vomiting)    "patch helped"  . Trigeminal neuralgia   . 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[facility])     Patient Active Problem List    Diagnosis Date Noted  . Thoracic vertebral fracture ([place]) 04/20/2020  . Chronic atrial fibrillation ([place]) 02/21/2020  . Anticoagulated 02/21/2020  . Hypokalemia 09/27/2019  . Bilateral pleural effusion 09/26/2019  . Age-related osteoporosis without current pathological fracture 06/10/2019  . Hemorrhoids, external, with complication 89/16/9450  . Rectal bleeding   . Rectal pain   . Hemorrhoids, internal, with bleeding 01/06/2018  . Hypertensive urgency 12/16/2017  . Intractable episodic headache   . Visual changes   . Depression with anxiety   . Chronic diastolic congestive heart failure ([place]) 10/31/2017  . Atypical chest pain 10/31/2017  . Compression fracture of L1 lumbar vertebra (H[facility]) 02/19/2017  . Lumbar compression fracture ([place]) 12/26/2016  . Gastroesophageal reflux disease without esophagitis 07/18/2016  . Acute on chronic diastolic CHF (congestive heart failure) ([place]) 07/11/2016  . Chronic coughing 07/11/2016  . Migraine headache 07/01/2016  . Anxiety 06/30/2016  . Altered mental status 12/06/2015  . Prolonged Q-T interval on ECG 12/06/2015  . Postural dizziness with presyncope 12/06/2015  . PAF (paroxysmal atrial fibrillation) ([place]) 07/14/2013  . Atrial flutter ([place]) 05/12/2013  . Bulging discs 04/20/2013  . Trigeminal neuralgia 01/15/2013  . Cellulitis 07/31/2011  . Mechanical complication of internal orthopedic implant ([place]) 07/21/2011  . 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 Ankle fracture 07/02/2010  . LESION OF PLANTAR NERVE 05/14/2009  . CLOSED BIMALLEOLAR FRACTURE 03/01/2009  .  Essential hypertension 11/07/2008  . CONSTIPATION, CHRONIC 11/07/2008  . Osteoporosis 11/07/2008    Past Surgical History:  Procedure Laterality Date  . ANKLE FRACTURE SURGERY Right   . BACK SURGERY  2022  . CARDIAC CATHETERIZATION     denies  . CATARACT EXTRACTION W/PHACO Right 07/22/2012   Procedure: CATARACT EXTRACTION PHACO AND INTRAOCULAR LENS PLACEMENT (IOC);   Surgeon: Tonny Branch, MD;  Location: [facility];  Service: Ophthalmology;  Laterality: Right;  CDE:  18.93  . CATARACT EXTRACTION W/PHACO Left 08/09/2012   Procedure: CATARACT EXTRACTION PHACO AND INTRAOCULAR LENS PLACEMENT (IOC);  Surgeon: Tonny Branch, MD;  Location: [facility];  Service: Ophthalmology;  Laterality: Left;  CDE: 17.21  . COLONOSCOPY    . FLEXIBLE SIGMOIDOSCOPY N/A 11/19/2018   Procedure: FLEXIBLE SIGMOIDOSCOPY;  Surgeon: Danie Binder, MD;  Location: [facility] SU[facility];  Service: Endoscopy;  Laterality: N/A;  7:30am  . HARDWARE REMOVAL  07/18/2011   Procedure: HARDWARE REMOVAL; Right leg-  Surgeon: Carole Civil, MD;  Location: [facility];  Service: Orthopedics;  Laterality: Right;  . HEMORRHOID BANDING N/A 11/19/2018   Procedure: HEMORRHOID BANDING;  Surgeon: Danie Binder, MD;  Location: [facility];  Service: Endoscopy;  Laterality: N/A;  . KYPHOPLASTY N/A 12/26/2016   Procedure: Lumbar two Lumbar three and Lumbar five KYPHOPLASTY;  Surgeon: Erline Levine, MD;  Location: [place];  Service: Neurosurgery;  Laterality: N/A;  . KYPHOPLASTY N/A 02/19/2017   Procedure: KYPHOPLASTY LUMBAR ONE;  Surgeon: Erline Levine, MD;  Location: [place];  Service: Neurosurgery;  Laterality: N/A;  KYPHOPLASTY LUMBAR ONE  . KYPHOPLASTY N/A 04/20/2020   Procedure: Thoracic eleven, Thoracic twelve Kyphoplasty;  Surgeon: Erline Levine, MD;  Location: [place];  Service: Neurosurgery;  Laterality: N/A;  . KYPHOPLASTY N/A 05/15/2020   Procedure: THORACIC NINE, THORACIC TEN  KYPHOPLASTY;  Surgeon: Erline Levine, MD;  Location: [place];  Service: Neurosurgery;  Laterality: N/A;  . NERVE REPAIR  07/18/2011   Procedure: NERVE REPAIR;  Surgeon: Carole Civil, MD;  Location: [facility];  Service: Orthopedics;  Laterality: Right;  Right leg superficial peroneal nerve release    . PARTIAL HYSTERECTOMY       OB History    Gravida  3   Para  3   Term  3   Preterm      AB      Living  3     SAB      IAB      Ectopic       Multiple      Live Births  3           Family History  Problem Relation Age of Onset  . Hypertension Mother   . Alzheimer's disease Mother   . Cancer Mother   . Hypertension Father   . Other Father        abused pain meds  . Diabetes Brother   . Other Brother        back problems  . Diabetes Daughter   . Anesthesia problems Neg Hx   . Hypotension Neg Hx   . Malignant hyperthermia Neg Hx   . Pseudochol deficiency Neg Hx     Social History   Tobacco Use  . Smoking status: Never Smoker  . Smokeless tobacco: Never Used  Vaping Use  . Vaping Use: Never used  Substance Use Topics  . Alcohol use: No    Alcohol/week: 0.0 standard drinks  . Drug use: No    Home  Medications Prior to Admission medications   Medication Sig Start Date End Date Taking? Authorizing Provider  acetaminophen (TYLENOL) 325 MG tablet Take 2 tablets (650 mg total) by mouth every 4 (four) hours as needed for mild pain ((score 1 to 3) or temp > 100.5). 04/21/20   Consuella Lose, MD  diltiazem (CARDIZEM CD) 120 MG 24 hr capsule TAKE (1) CAPSULE BY MOUTH ONCE DAILY. Patient taking differently: Take 120 mg by mouth daily. 12/27/19   Satira Sark, MD  fluticasone (FLONASE) 50 MCG/ACT nasal spray Place 2 sprays into both nostrils daily. Patient taking differently: Place 2 sprays into both nostrils daily as needed (ears stopped it). 12/20/19   Kathyrn Drown, MD  furosemide (LASIX) 40 MG tablet Take 1.5 tablets (60 mg total) by mouth daily. 03/09/20 06/07/20  Satira Sark, MD  hydrALAZINE (APRESOLINE) 25 MG tablet Take 1 tablet (25 mg total) by mouth 3 (three) times daily. Hold 05/29/20 08/27/20  Strader, Fransisco Hertz, PA-C  hydrocortisone (ANUSOL-HC) 2.5 % rectal cream APPLY RECTALLY THREE TIMES DAILY AS DIRECTED Patient taking differently: Place 1 application rectally 2 (two) times daily as needed for hemorrhoids or anal itching. 10/19/18   Mikey Kirschner, MD  HYDROmorphone (DILAUDID) 2 MG tablet  Take 2-3 mg by mouth every 6 (six) hours as needed for pain. 05/08/20   [provider]  Lidocaine-Hydrocortisone Ace 3-2.5 % KIT APPLY TO RECTUM QID AS NEEDED FOR RECTAL PAIN OR BLEEDING Patient taking differently: Place 1 application rectally daily as needed (rectal pain or bleeding). 05/12/19   Fields, Marga Melnick, MD  LORazepam (ATIVAN) 0.5 MG tablet Take 1 tablet (0.5 mg total) by mouth 2 (two) times daily as needed for anxiety or sleep. TAKE ONE TABLET BY MOUTH UP TO TWICE DAILY AS NEEDED. Patient taking differently: Take 0.5 mg by mouth 2 (two) times daily as needed for anxiety or sleep. 12/14/19   Kathyrn Drown, MD  losartan (COZAAR) 50 MG tablet Take 1 tablet (50 mg total) by mouth daily. Hold 05/29/20 08/27/20  Strader, Fransisco Hertz, PA-C  Magnesium 250 MG TABS Take 250 mg by mouth at bedtime.     [provider]  methocarbamol (ROBAXIN) 500 MG tablet Take 1 tablet (500 mg total) by mouth every 8 (eight) hours as needed for muscle spasms. 04/21/20   Consuella Lose, MD  metolazone (ZAROXOLYN) 2.5 MG tablet Take 1 tablet (2.5 mg total) by mouth as needed (Take for weight gain > 2 lbs overnight or > 5 lbs in one week.). 04/12/20 07/11/20  Strader, Fransisco Hertz, PA-C  metoprolol succinate (TOPROL-XL) 100 MG 24 hr tablet TAKE ONE TABLET BY MOUTH ONCE DAILY. Patient taking differently: Take 100 mg by mouth daily. 03/29/20   Satira Sark, MD  Multiple Vitamin (MULTIVITAMIN WITH MINERALS) TABS tablet Take 1 tablet by mouth daily. Centrum Silver    [provider]  Polyethyl Glycol-Propyl Glycol (LUBRICANT EYE DROPS) 0.4-0.3 % SOLN Place 1 drop into both eyes at bedtime.    [provider]  polyethylene glycol powder (GLYCOLAX/MIRALAX) 17 GM/SCOOP powder Take 17 g daily as needed by mouth for moderate constipation.     [provider]  potassium chloride (KLOR-CON) 10 MEQ tablet Take 20 meq am and 10 mg pm Patient taking differently: Take 10-20 mEq by mouth See  admin instructions. Take 20 meq am and 10 meg at lunch 03/02/20   Satira Sark, MD  Rivaroxaban (XARELTO) 15 MG TABS tablet Take 1 tablet (15 mg  total) by mouth daily with supper. 04/23/20   Satira Sark, MD  shark liver oil-cocoa butter (PREPARATION H) 0.25-3-85.5 % suppository Place 1 suppository rectally 2 (two) times daily as needed for hemorrhoids.     [provider]  zolpidem (AMBIEN) 5 MG tablet TAKE (1) TABLET BY MOUTH AT BEDTIME FOR SLEEP. Patient taking differently: Take 5 mg by mouth at bedtime as needed for sleep. 12/14/19   Kathyrn Drown, MD    Allergies    Amlodipine, Codeine, Other, and Tramadol  Review of Systems   Review of Systems  Constitutional: Positive for fatigue. Negative for chills and fever.  HENT: Negative for trouble swallowing.   Respiratory: Negative for cough and shortness of breath.   Cardiovascular: Positive for leg swelling. Negative for chest pain and palpitations.  Gastrointestinal: Positive for diarrhea. Negative for abdominal pain, blood in stool, nausea and vomiting.  Genitourinary: Positive for decreased urine volume. Negative for dysuria and flank pain.  Musculoskeletal: Positive for back pain. Negative for arthralgias, myalgias, neck pain and neck stiffness.  Skin: Negative for color change and rash.  Neurological: Negative for dizziness, syncope, weakness (Generalized weakness) and numbness.  Hematological: Does not bruise/bleed easily.  Psychiatric/Behavioral: Negative for confusion.    Physical Exam Updated Vital Signs BP (!) 113/93   Pulse (!) 102   Temp 97.6 F (36.4 C) (Oral)   Resp (!) 28   Ht 5' (1.524 m)   Wt 54.6 kg   SpO2 90%   BMI 23.51 kg/m   Physical Exam Vitals and nursing note reviewed.  Constitutional:      Appearance: Normal appearance. She is not toxic-appearing.     Comments: Frail elderly appearing female  HENT:     Head: Normocephalic.  Eyes:     Extraocular Movements: Extraocular  movements intact.     Conjunctiva/sclera: Conjunctivae normal.     Pupils: Pupils are equal, round, and reactive to light.  Neck:     Thyroid: No thyromegaly.     Meningeal: Kernig's sign absent.  Cardiovascular:     Rate and Rhythm: Normal rate. Rhythm irregular.     Pulses: Normal pulses.  Pulmonary:     Effort: Pulmonary effort is normal. No respiratory distress.     Breath sounds: Normal breath sounds. No wheezing.  Abdominal:     Palpations: Abdomen is soft.     Tenderness: There is no abdominal tenderness. There is no guarding or rebound.  Musculoskeletal:        General: Normal range of motion.     Cervical back: Normal range of motion. No rigidity or tenderness.     Right lower leg: Edema present.     Left lower leg: Edema present.     Comments: 1-2+ pitting edema the bilateral lower extremities.  No excessive warmth or erythema.  Skin:    General: Skin is warm.     Capillary Refill: Capillary refill takes less than 2 seconds.     Findings: No erythema or rash.  Neurological:     General: No focal deficit present.     Mental Status: She is alert.     GCS: GCS eye subscore is 4. GCS verbal subscore is 5. GCS motor subscore is 6.     Sensory: Sensation is intact. No sensory deficit.     Motor: Motor function is intact. No weakness.     Coordination: Coordination is intact.     Comments: CN II-XII grossly intact.  Speech clear.  No pronator  drift     ED Results / Procedures / Treatments   Labs (all labs ordered are listed, but only abnormal results are displayed) Labs Reviewed  LIPASE, BLOOD - Abnormal; Notable for the following components:      Result Value   Lipase 54 (*)    All other components within normal limits  COMPREHENSIVE METABOLIC PANEL - Abnormal; Notable for the following components:   CO2 21 (*)    BUN 42 (*)    Creatinine, Ser 1.69 (*)    Total Protein 6.3 (*)    Alkaline Phosphatase 139 (*)    Total Bilirubin 1.6 (*)    GFR, Estimated 29 (*)     All other components within normal limits  URINALYSIS, ROUTINE W REFLEX MICROSCOPIC - Abnormal; Notable for the following components:   Color, Urine STRAW (*)    All other components within normal limits  CBC WITH DIFFERENTIAL/PLATELET - Abnormal; Notable for the following components:   Hemoglobin 11.5 (*)    MCH 24.8 (*)    MCHC 29.3 (*)    RDW 24.8 (*)    nRBC 0.3 (*)    All other components within normal limits  BRAIN NATRIURETIC PEPTIDE - Abnormal; Notable for the following components:   B Natriuretic Peptide 2,187.0 (*)    All other components within normal limits  TROPONIN I (HIGH SENSITIVITY) - Abnormal; Notable for the following components:   Troponin I (High Sensitivity) 21 (*)    All other components within normal limits  GASTROINTESTINAL PANEL BY PCR, STOOL (REPLACES STOOL CULTURE)  C DIFFICILE QUICK SCREEN W PCR REFLEX  RESP PANEL BY RT-PCR (FLU A&B, COVID) [id number]  TROPONIN I (HIGH SENSITIVITY)    EKG EKG Interpretation  Date/Time:  Wednesday May 30 2020 09:48:06 EDT Ventricular Rate:  87 PR Interval:    QRS Duration: 88 QT Interval:  382 QTC Calculation: 460 R Axis:   118 Text Interpretation: Atrial fibrillation Left posterior fascicular block Baseline wander Nonspecific T wave abnormality Confirmed by Lajean Saver [phone]) on 05/30/2020 9:59:25 AM   Radiology DG Chest Portable 1 View  Result Date: 05/30/2020 CLINICAL DATA:  Weakness. EXAM: PORTABLE CHEST 1 VIEW COMPARISON:  12/30/2019 and older studies. FINDINGS: Mild stable enlargement of the cardiopericardial silhouette. No mediastinal or hilar masses. Prominent interstitial markings. Calcification noted in the right lower hemithorax. These findings are stable. No evidence of pneumonia or pulmonary edema. No convincing pleural effusion and no pneumothorax. Skeletal structures are demineralized. There are multiple old vertebral fractures with 7 visualized treated with previous vertebroplasty. IMPRESSION: 1.  No acute cardiopulmonary disease. 2. Chronic interstitial prominence in the lungs. Stable mild cardiomegaly. Electronically Signed   By: Lajean Manes M.D.   On: 05/30/2020 10:12    Procedures Procedures    CRITICAL CARE Performed by: Janayla Marik Total critical care time: 35 minutes Critical care time was exclusive of separately billable procedures and treating other patients. Critical care was necessary to treat or prevent imminent or life-threatening deterioration. Critical care was time spent personally by me on the following activities: development of treatment plan with patient and/or surrogate as well as nursing, discussions with consultants, evaluation of patient's response to treatment, examination of patient, obtaining history from patient or surrogate, ordering and performing treatments and interventions, ordering and review of laboratory studies, ordering and review of radiographic studies, pulse oximetry and re-evaluation of patient's condition.  Medications Ordered in ED Medications - No data to display  ED Course  I have reviewed the triage vital  signs and the nursing notes.  Pertinent labs & imaging results that were available during my care of the patient were reviewed by me and considered in my medical decision making (see chart for details).    MDM Rules/Calculators/A&P                          Patient 87 year old female here for evaluation of diarrhea, generalized weakness and lower extremity edema.  Gradually worsening since kyphoplasty in late March.  Accompanied by daughter who provides most of history.  Has history of CHF, takes 60 mg Lasix daily.  No missed doses.  Daughter reports 4 pound weight gain in 3 days.  Contacted cardiology office this morning and was advised to come to emergency department for further evaluation.    On exam, frail elderly appearing female with 2+ pitting edema bilateral lower extremities.  No erythema or excessive warmth.  Lungs are  clear bilaterally. Labs interpreted by me, no leukocytosis, BNP elevated at greater than 2100.  Potassium within normal limits.  Kidney functions appear near baseline.  EKG shows atrial fibrillation without RVR.  Chest x-ray without significant pleural edema or effusions.  Initial trop elevated at 54, no complaint of CP.  Reported diarrhea source unclear, was given antibiotics prior to her last procedure, but no oral antibiotics recently.  Doubt C Diff.  GI panel by PCR pending.  Likely exacerbation of CHF.  I will consult cardiology.  IV diuresis ordered.  She will likely need admission.   Discussed findings with cardiology, Dr. Johnsie Cancel who recommends medical admission for further diuresis and likely repeat echocardiogram and increase Lasix dose to 60 twice daily.  We will consult hospitalist for admission.  Discussed findings with [facility] hospitalist, Dr. Dyann Kief who agrees to admit.   Final Clinical Impression(s) / ED Diagnoses Final diagnoses:  Acute on chronic diastolic congestive heart failure ([place])  Diarrhea, unspecified type    Rx / DC Orders ED Discharge Orders    None       Kem Parkinson, PA-C 05/30/20 1254    Lajean Saver, MD 05/31/20 1711    Kem Parkinson, PA-C 06/16/20 2210    Lajean Saver, MD 06/17/20 1152

## 2020-05-30 NOTE — ED Triage Notes (Signed)
Pt presented to ED with c/o diarrhea, increased weakness, fluid retention. Referred by Dr. Diona Browner to come to ED.

## 2020-05-30 NOTE — Progress Notes (Signed)
*  PRELIMINARY RESULTS* Echocardiogram 2D Echocardiogram has been performed.  Katherine Lane 05/30/2020, 4:23 PM

## 2020-05-30 NOTE — Telephone Encounter (Signed)
Spoke to patient's daughter who states patient is weak and disoriented. She stated that the last time patient was this way, her potassium was low. Patient's daughter also stated that patient's weight was 120.4 lb and she has had swollen legs/feet since her back procedure. She has taken an extra Metolazone and K-dur per Dr. Ival Bible advice. Daughter states that she has called PCP's office per [place] advice, but that Dr. Gerda Diss is out of the office this week and his nurse stated that patient should be taken to the ER. Pt's daughter did not feel comfortable having patient wait in the ER for a long period of time since she called the ED and they stated to her that it would be a 6 hour wait. Pt's daughter wanted [place] to order blood work for patient to see if her levels come back normal. After speaking to B. Strader, PA-C, it was stated to the patients daughter that it would be best to take patient to ER and let them do a full work up as ER is currently not full. Pt's daughter still did not want to take patient to ER unless absolutely necessary and she would rather have the blood work done. Jaynie Bream, PA-C ordered a BMET and CBC. Patients daughter was made aware and verbalized understanding.

## 2020-05-30 NOTE — ED Notes (Signed)
Pt placed on 4L [place] due to oxygen desaturations. PA notified.

## 2020-05-30 NOTE — Telephone Encounter (Signed)
New Message   Daughter [name] called she would like you to put in orders for lab work , she thinks her mothers potassium levels have dropped . Daughter states she is very weak and there is no way she can sit in the Emergency room for 6 hours to be checked out

## 2020-05-30 NOTE — ED Notes (Signed)
Assisted pt to bedside toilet did not tolerate well

## 2020-05-30 NOTE — Consult Note (Signed)
Cardiology Consultation:   Patient ID: JAYD FORREY MRN: [id number]; DOB: [birth year]  Admit date: 05/30/2020 Date of Consult: 05/30/2020  PCP:  Kathyrn Drown, MD   [place]  Cardiologist:  Rozann Lesches, MD   Patient Profile:   Katherine Lane is a 87 y.o. female with a hx of chronic diastolic CHF, permanent atrial fibrillation, HTN, and chronic back pain who is being seen today for the evaluation of CHF at the request of Dr. Ashok Cordia.  History of Present Illness:   Katherine Lane is chronically ill but still lives alone Has had FTT since having two kyphoplasties . She has history of chronic afib HTN and diastolic CHF. Daughter indicates lethargy and diarrhea for last few days. Weight is up about 4 lbs and she has more dyspnea with some LE edema. She took her normal lasix and one dose of zaroxyln yesterday morning but continued to not feel  Well In ER Afib rate controlled Sats 99% CXR NAD no CHF and mild CE BNP elevated 2187 seems to be chronically elevated although ot to this extent. No chest pain , palpitations or syncope No fever Still having back pain despite 2 procedures Troponin minimally elevated 21 x 2 no change    Past Medical History:  Diagnosis Date  . Anxiety   . Arthritis   . Atrial fibrillation ([place])   . CHF (congestive heart failure) ([place])   . Chronic back pain   . Complication of anesthesia   . Constipation   . Depression   . Diastolic heart failure ([place])   . Dyspnea   . Dysrhythmia    AFib  . Essential hypertension   . History of blood transfusion   . History of cervical fracture   . Insomnia   . Migraine headache   . Osteoporosis   . Pneumonia   . PONV (postoperative nausea and vomiting)    "patch helped"  . Trigeminal neuralgia   . 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[facility])     Past Surgical History:  Procedure Laterality Date  . ANKLE FRACTURE SURGERY Right   . BACK SURGERY  2022  . CARDIAC CATHETERIZATION     denies  .  CATARACT EXTRACTION W/PHACO Right 07/22/2012   Procedure: CATARACT EXTRACTION PHACO AND INTRAOCULAR LENS PLACEMENT (IOC);  Surgeon: Tonny Branch, MD;  Location: [facility] OR[facility];  Service: Ophthalmology;  Laterality: Right;  CDE:  18.93  . CATARACT EXTRACTION W/PHACO Left 08/09/2012   Procedure: CATARACT EXTRACTION PHACO AND INTRAOCULAR LENS PLACEMENT (IOC);  Surgeon: Tonny Branch, MD;  Location: [facility];  Service: Ophthalmology;  Laterality: Left;  CDE: 17.21  . COLONOSCOPY    . FLEXIBLE SIGMOIDOSCOPY N/A 11/19/2018   Procedure: FLEXIBLE SIGMOIDOSCOPY;  Surgeon: Danie Binder, MD;  Location: [facility];  Service: Endoscopy;  Laterality: N/A;  7:30am  . HARDWARE REMOVAL  07/18/2011   Procedure: HARDWARE REMOVAL; Right leg-  Surgeon: Carole Civil, MD;  Location: [facility];  Service: Orthopedics;  Laterality: Right;  . HEMORRHOID BANDING N/A 11/19/2018   Procedure: HEMORRHOID BANDING;  Surgeon: Danie Binder, MD;  Location[facility];  Service: Endoscopy;  Laterality: N/A;  . KYPHOPLASTY N/A 12/26/2016   Procedure: Lumbar two Lumbar three and Lumbar five KYPHOPLASTY;  Surgeon: Erline Levine, MD;  Location: [place];  Service: Neurosurgery;  Laterality: N/A;  . KYPHOPLASTY N/A 02/19/2017   Procedure: KYPHOPLASTY LUMBAR ONE;  Surgeon: Erline Levine, MD;  Location: [place];  Service: Neurosurgery;  Laterality: N/A;  KYPHOPLASTY  LUMBAR ONE  . KYPHOPLASTY N/A 04/20/2020   Procedure: Thoracic eleven, Thoracic twelve Kyphoplasty;  Surgeon: Erline Levine, MD;  Location: [place];  Service: Neurosurgery;  Laterality: N/A;  . KYPHOPLASTY N/A 05/15/2020   Procedure: THORACIC NINE, THORACIC TEN  KYPHOPLASTY;  Surgeon: Erline Levine, MD;  Location: [place];  Service: Neurosurgery;  Laterality: N/A;  . NERVE REPAIR  07/18/2011   Procedure: NERVE REPAIR;  Surgeon: Carole Civil, MD;  Location: [facility];  Service: Orthopedics;  Laterality: Right;  Right leg superficial peroneal nerve release    . PARTIAL HYSTERECTOMY       Home  Medications:  Prior to Admission medications   Medication Sig Start Date End Date Taking? Authorizing Provider  acetaminophen (TYLENOL) 325 MG tablet Take 2 tablets (650 mg total) by mouth every 4 (four) hours as needed for mild pain ((score 1 to 3) or temp > 100.5). 04/21/20  Yes Consuella Lose, MD  diltiazem (CARDIZEM CD) 120 MG 24 hr capsule TAKE (1) CAPSULE BY MOUTH ONCE DAILY. Patient taking differently: Take 120 mg by mouth daily. 12/27/19  Yes Satira Sark, MD  fluticasone [facility]) 50 MCG/ACT nasal spray Place 2 sprays into both nostrils daily. Patient taking differently: Place 2 sprays into both nostrils daily as needed (ears stopped it). 12/20/19  Yes Kathyrn Drown, MD  furosemide (LASIX) 40 MG tablet Take 1.5 tablets (60 mg total) by mouth daily. 03/09/20 06/07/20 Yes Satira Sark, MD  hydrocortisone (ANUSOL-HC) 2.5 % rectal cream APPLY RECTALLY THREE TIMES DAILY AS DIRECTED Patient taking differently: Place 1 application rectally 2 (two) times daily as needed for hemorrhoids or anal itching. 10/19/18  Yes Mikey Kirschner, MD  HYDROmorphone (DILAUDID) 2 MG tablet Take 2-3 mg by mouth every 6 (six) hours as needed for pain. 05/08/20  Yes [provider]  Lidocaine-Hydrocortisone Ace 3-2.5 % KIT APPLY TO RECTUM QID AS NEEDED FOR RECTAL PAIN OR BLEEDING Patient taking differently: Place 1 application rectally daily as needed (rectal pain or bleeding). 05/12/19  Yes Fields, Sandi L, MD  LORazepam (ATIVAN) 0.5 MG tablet Take 1 tablet (0.5 mg total) by mouth 2 (two) times daily as needed for anxiety or sleep. TAKE ONE TABLET BY MOUTH UP TO TWICE DAILY AS NEEDED. Patient taking differently: Take 0.5 mg by mouth 2 (two) times daily as needed for anxiety or sleep. 12/14/19  Yes Kathyrn Drown, MD  losartan (COZAAR) 50 MG tablet Take 1 tablet (50 mg total) by mouth daily. Hold 05/29/20 08/27/20 Yes Strader, Fransisco Hertz, PA-C  methocarbamol (ROBAXIN) 500 MG tablet Take 1 tablet (500  mg total) by mouth every 8 (eight) hours as needed for muscle spasms. 04/21/20  Yes Consuella Lose, MD  metoprolol succinate (TOPROL-XL) 100 MG 24 hr tablet TAKE ONE TABLET BY MOUTH ONCE DAILY. Patient taking differently: Take 100 mg by mouth daily. 03/29/20  Yes Satira Sark, MD  Multiple Vitamin (MULTIVITAMIN WITH MINERALS) TABS tablet Take 1 tablet by mouth daily. Centrum Silver   Yes [provider]  Polyethyl Glycol-Propyl Glycol (LUBRICANT EYE DROPS) 0.4-0.3 % SOLN Place 1 drop into both eyes at bedtime.   Yes [provider]  polyethylene glycol powder (GLYCOLAX/MIRALAX) 17 GM/SCOOP powder Take 17 g daily as needed by mouth for moderate constipation.    Yes [provider]  potassium chloride (KLOR-CON) 10 MEQ tablet Take 20 meq am and 10 mg pm Patient taking differently: Take 10-20 mEq by mouth See admin instructions. Take 20 meq am  and 10 meq at lunch 03/02/20  Yes Satira Sark, MD  Rivaroxaban (XARELTO) 15 MG TABS tablet Take 1 tablet (15 mg total) by mouth daily with supper. 04/23/20  Yes Satira Sark, MD  shark liver oil-cocoa butter (PREPARATION H) 0.25-3-85.5 % suppository Place 1 suppository rectally 2 (two) times daily as needed for hemorrhoids.    Yes [provider]  zolpidem (AMBIEN) 5 MG tablet TAKE (1) TABLET BY MOUTH AT BEDTIME FOR SLEEP. Patient taking differently: Take 5 mg by mouth at bedtime as needed for sleep. 12/14/19  Yes Kathyrn Drown, MD  hydrALAZINE (APRESOLINE) 25 MG tablet Take 1 tablet (25 mg total) by mouth 3 (three) times daily. Hold 05/29/20 08/27/20  Strader, Fransisco Hertz, PA-C  metolazone (ZAROXOLYN) 2.5 MG tablet Take 1 tablet (2.5 mg total) by mouth as needed (Take for weight gain > 2 lbs overnight or > 5 lbs in one week.). Patient not taking: No sig reported 04/12/20 07/11/20  Erma Heritage, PA-C    Inpatient Medications: Scheduled Meds:  Continuous Infusions:  PRN Meds:   Allergies:     Allergies  Allergen Reactions  . Amlodipine Swelling    Leg swelling   . Codeine Nausea And Vomiting and Other (See Comments)    Patient states "intolerance to all pain medications"  (NO OPIOIDS) VERY VIOLENT VOMITING!!  . Other Nausea And Vomiting and Other (See Comments)    general anesthesia  . Tramadol Nausea And Vomiting and Other (See Comments)    "NO OPIOIDS!!! VERY VIOLENT VOMITING!!" per Med History prior to 02/18/17    Social History:   Social History   Socioeconomic History  . Marital status: Widowed    Spouse name: Not on file  . Number of children: 3  . Years of education: Not on file  . Highest education level: Not on file  Occupational History  . Occupation: retired    Fish farm manager: RETIRED  Tobacco Use  . Smoking status: Never Smoker  . Smokeless tobacco: Never Used  Vaping Use  . Vaping Use: Never used  Substance and Sexual Activity  . Alcohol use: No    Alcohol/week: 0.0 standard drinks  . Drug use: No  . Sexual activity: Not Currently    Birth control/protection: Surgical    Comment: hyst  Other Topics Concern  . Not on file  Social History Narrative  . Not on file   Social Determinants of Health   Financial Resource Strain: Not on file  Food Insecurity: Not on file  Transportation Needs: Not on file  Physical Activity: Not on file  Stress: Not on file  Social Connections: Not on file  Intimate Partner Violence: Not on file    Family History:    Family History  Problem Relation Age of Onset  . Hypertension Mother   . Alzheimer's disease Mother   . Cancer Mother   . Hypertension Father   . Other Father        abused pain meds  . Diabetes Brother   . Other Brother        back problems  . Diabetes Daughter   . Anesthesia problems Neg Hx   . Hypotension Neg Hx   . Malignant hyperthermia Neg Hx   . Pseudochol deficiency Neg Hx      ROS:  Please see the history of present illness.   All other ROS reviewed and negative.      Physical Exam/Data:   Vitals:   05/30/20 0945 05/30/20 1000  05/30/20 1015 05/30/20 1111  BP:  (!) 113/93  116/73  Pulse: 83 (!) 102  (!) 108  Resp: (!) 31 (!) 28  (!) 31  Temp:      TempSrc:      SpO2: 91%  90% 92%  Weight:      Height:        Intake/Output Summary (Last 24 hours) at 05/30/2020 1210 Last data filed at 05/30/2020 1209 Gross per 24 hour  Intake --  Output 450 ml  Net -450 ml   Last 3 Weights 05/30/2020 05/15/2020 04/20/2020  Weight (lbs) 120 lb 6.4 oz 118 lb 117 lb 15.1 oz  Weight (kg) 54.613 kg 53.524 kg 53.5 kg     Body mass index is 23.51 kg/m.  Frail elderly female Only mild volume overload JVP normal  SEM  Abdomen soft Post kyphoplasty Trace LE edema   EKG:  The EKG was personally reviewed and demonstrates:  afib nonspecific ST chagnes  Telemetry:  Telemetry was personally reviewed and demonstrates:  afib rates 80's   Relevant CV Studies:  Echocardiogram: 09/28/2019 IMPRESSIONS    1. Left ventricular ejection fraction, by estimation, is 60 to 65%. The  left ventricle has normal function. The left ventricle has no regional  wall motion abnormalities. There is moderate left ventricular hypertrophy.  Left ventricular diastolic  parameters are consistent with Grade II diastolic dysfunction  (pseudonormalization). Elevated left atrial pressure.  2. Right ventricular systolic function is mildly reduced. The right  ventricular size is moderately enlarged.  3. Left atrial size was severely dilated.  4. Right atrial size was severely dilated.  5. The mitral valve is normal in structure. No evidence of mitral valve  regurgitation. No evidence of mitral stenosis.  6. The aortic valve is tricuspid. Aortic valve regurgitation is not  visualized. No aortic stenosis is present.  7. Severe pulmonary HTN, PASP is 45 mmHg.  8. The inferior vena cava is dilated in size with <50% respiratory  variability, suggesting right atrial pressure of 15 mmHg.    Laboratory Data:  High Sensitivity Troponin:   Recent Labs  Lab 05/30/20 0941  TROPONINIHS 21*     Chemistry Recent Labs  Lab 05/30/20 0941  NA 138  K 3.7  CL 102  CO2 21*  GLUCOSE 96  BUN 42*  CREATININE 1.69*  CALCIUM 8.9  GFRNONAA 29*  ANIONGAP 15    Recent Labs  Lab 05/30/20 0941  PROT 6.3*  ALBUMIN 3.5  AST 33  ALT 20  ALKPHOS 139*  BILITOT 1.6*   Hematology Recent Labs  Lab 05/30/20 0941  WBC 7.4  RBC 4.64  HGB 11.5*  HCT 39.3  MCV 84.7  MCH 24.8*  MCHC 29.3*  RDW 24.8*  PLT 238   BNP Recent Labs  Lab 05/30/20 0946  BNP 2,187.0*    DDimer No results for input(s): DDIMER in the last 168 hours.   Radiology/Studies:  DG Chest Portable 1 View  Result Date: 05/30/2020 CLINICAL DATA:  Weakness. EXAM: PORTABLE CHEST 1 VIEW COMPARISON:  12/30/2019 and older studies. FINDINGS: Mild stable enlargement of the cardiopericardial silhouette. No mediastinal or hilar masses. Prominent interstitial markings. Calcification noted in the right lower hemithorax. These findings are stable. No evidence of pneumonia or pulmonary edema. No convincing pleural effusion and no pneumothorax. Skeletal structures are demineralized. There are multiple old vertebral fractures with 7 visualized treated with previous vertebroplasty. IMPRESSION: 1. No acute cardiopulmonary disease. 2. Chronic interstitial prominence in the lungs. Stable mild cardiomegaly.  Electronically Signed   By: Lajean Manes M.D.   On: 05/30/2020 10:12     Assessment and Plan:   1. Afib: chronic rate control is fine continue Cardizem and Xarelto 2. Diastolic CHF:  Confusing a bit She is azotemic with clear CXR and mild volume overload on exam despite Very elevated BNP  This is chronically elevated an may reflect LAE from her afib partly Will update echo Would stick With her daily lasix dose since she is not grossly volume overloaded, CXR clear , sats good and she is azotemic with diarrhea 3. Diarrhea:   Has not had PO antibiotics cultures and plan per hospital   4. Back Pain: post Kyphoplasty x 2 Rx dilaudid and lidoderm patch    Risk Assessment/Risk Scores:          For questions or updates, please contact [place] Please consult [email or web address] for contact info under    Signed, Erma Heritage, PA-C  05/30/2020 12:10 PM

## 2020-05-31 ENCOUNTER — Observation Stay (HOSPITAL_COMMUNITY): Payer: Medicare PPO

## 2020-05-31 DIAGNOSIS — G8929 Other chronic pain: Secondary | ICD-10-CM

## 2020-05-31 DIAGNOSIS — I517 Cardiomegaly: Secondary | ICD-10-CM | POA: Diagnosis not present

## 2020-05-31 DIAGNOSIS — N183 Chronic kidney disease, stage 3 unspecified: Secondary | ICD-10-CM | POA: Diagnosis not present

## 2020-05-31 DIAGNOSIS — M545 Low back pain, unspecified: Secondary | ICD-10-CM

## 2020-05-31 DIAGNOSIS — I2699 Other pulmonary embolism without acute cor pulmonale: Secondary | ICD-10-CM | POA: Diagnosis not present

## 2020-05-31 DIAGNOSIS — I5033 Acute on chronic diastolic (congestive) heart failure: Secondary | ICD-10-CM | POA: Diagnosis not present

## 2020-05-31 DIAGNOSIS — I10 Essential (primary) hypertension: Secondary | ICD-10-CM | POA: Diagnosis not present

## 2020-05-31 DIAGNOSIS — I2693 Single subsegmental pulmonary embolism without acute cor pulmonale: Secondary | ICD-10-CM | POA: Diagnosis not present

## 2020-05-31 DIAGNOSIS — R197 Diarrhea, unspecified: Secondary | ICD-10-CM | POA: Diagnosis not present

## 2020-05-31 DIAGNOSIS — J90 Pleural effusion, not elsewhere classified: Secondary | ICD-10-CM | POA: Diagnosis not present

## 2020-05-31 DIAGNOSIS — N1832 Chronic kidney disease, stage 3b: Secondary | ICD-10-CM | POA: Diagnosis not present

## 2020-05-31 DIAGNOSIS — J9601 Acute respiratory failure with hypoxia: Secondary | ICD-10-CM | POA: Diagnosis not present

## 2020-05-31 DIAGNOSIS — I482 Chronic atrial fibrillation, unspecified: Secondary | ICD-10-CM | POA: Diagnosis not present

## 2020-05-31 LAB — BASIC METABOLIC PANEL
Anion gap: 13 (ref 5–15)
BUN: 43 mg/dL — ABNORMAL HIGH (ref 8–23)
CO2: 26 mmol/L (ref 22–32)
Calcium: 8.9 mg/dL (ref 8.9–10.3)
Chloride: 99 mmol/L (ref 98–111)
Creatinine, Ser: 1.63 mg/dL — ABNORMAL HIGH (ref 0.44–1.00)
GFR, Estimated: 31 mL/min — ABNORMAL LOW (ref >60)
Glucose, Bld: 81 mg/dL (ref 70–99)
Potassium: 3.5 mmol/L (ref 3.5–5.1)
Sodium: 138 mmol/L (ref 135–145)

## 2020-05-31 MED ORDER — FUROSEMIDE 10 MG/ML IJ SOLN: 40.0000 mg | Freq: Once | INTRAMUSCULAR | Status: DC

## 2020-05-31 MED ORDER — FUROSEMIDE 40 MG PO TABS
40.0000 mg | ORAL_TABLET | Freq: Two times a day (BID) | ORAL | Status: DC
  Administered 2020-06-01: 40 mg via ORAL
  Filled 2020-05-31 (×2): qty 1

## 2020-05-31 MED ORDER — APIXABAN 5 MG PO TABS: 5.0000 mg | ORAL_TABLET | Freq: Two times a day (BID) | ORAL | Status: DC

## 2020-05-31 MED ORDER — APIXABAN 5 MG PO TABS
10.0000 mg | ORAL_TABLET | Freq: Two times a day (BID) | ORAL | Status: DC
  Administered 2020-05-31 – 2020-06-01 (×3): 10 mg via ORAL
  Filled 2020-05-31 (×3): qty 2

## 2020-05-31 MED ORDER — IOHEXOL 300 MG/ML  SOLN: 75.0000 mL | Freq: Once | INTRAMUSCULAR | Status: DC | PRN

## 2020-05-31 MED ORDER — FUROSEMIDE 10 MG/ML IJ SOLN
60.0000 mg | Freq: Once | INTRAMUSCULAR | Status: AC
  Administered 2020-05-31: 60 mg via INTRAVENOUS
  Filled 2020-05-31: qty 6

## 2020-05-31 MED ORDER — IOHEXOL 350 MG/ML SOLN
75.0000 mL | Freq: Once | INTRAVENOUS | Status: AC | PRN
  Administered 2020-05-31: 75 mL via INTRAVENOUS

## 2020-05-31 NOTE — Plan of Care (Signed)
  Problem: Acute Rehab PT Goals(only PT should resolve) Goal: Pt Will Go Supine/Side To Sit Outcome: Progressing Flowsheets (Taken 05/31/2020 1442) Pt will go Supine/Side to Sit:  with modified independence  with supervision Goal: Patient Will Transfer Sit To/From Stand Outcome: Progressing Flowsheets (Taken 05/31/2020 1442) Patient will transfer sit to/from stand:  with modified independence  with supervision Goal: Pt Will Transfer Bed To Chair/Chair To Bed Outcome: Progressing Flowsheets (Taken 05/31/2020 1442) Pt will Transfer Bed to Chair/Chair to Bed:  with modified independence  with supervision Goal: Pt Will Ambulate Outcome: Progressing Flowsheets (Taken 05/31/2020 1442) Pt will Ambulate:  75 feet  with supervision  with rolling walker   2:42 PM, 05/31/20 Ocie Bob, MPT Physical Therapist with [facility] [phone] office [phone] mobile phone

## 2020-05-31 NOTE — Progress Notes (Signed)
ANTICOAGULATION CONSULT NOTE - Initial Consult  Pharmacy Consult for apixaban Indication: atrial fibrillation and pulmonary embolus  Allergies  Allergen Reactions  . Amlodipine Swelling    Leg swelling   . Codeine Nausea And Vomiting and Other (See Comments)    Patient states "intolerance to all pain medications"  (NO OPIOIDS) VERY VIOLENT VOMITING!!  . Other Nausea And Vomiting and Other (See Comments)    general anesthesia  . Tramadol Nausea And Vomiting and Other (See Comments)    "NO OPIOIDS!!! VERY VIOLENT VOMITING!!" per Med History prior to 02/18/17    Patient Measurements: Height: 5' (152.4 cm) Weight: 54.6 kg (120 lb 6.4 oz) IBW/kg (Calculated) : 45.5 Heparin Dosing Weight:   Vital Signs: Temp: 98.5 F (36.9 C) (04/14 1344) Temp Source: Oral (04/14 1344) BP: 99/63 (04/14 1344) Pulse Rate: 79 (04/14 1344)  Labs: Recent Labs    05/30/20 0941 05/30/20 1137 05/31/20 0410  HGB 11.5*  --   --   HCT 39.3  --   --   PLT 238  --   --   CREATININE 1.69*  --  1.63*  TROPONINIHS 21* 21*  --     Estimated Creatinine Clearance: 19.6 mL/min (A) (by C-G formula based on SCr of 1.63 mg/dL (H)).   Medical History: Past Medical History:  Diagnosis Date  . Anxiety   . Arthritis   . Atrial fibrillation ([place])   . CHF (congestive heart failure) ([place])   . Chronic back pain   . Complication of anesthesia   . Constipation   . Depression   . Diastolic heart failure ([place])   . Dyspnea   . Dysrhythmia    AFib  . Essential hypertension   . History of blood transfusion   . History of cervical fracture   . Insomnia   . Migraine headache   . Osteoporosis   . Pneumonia   . PONV (postoperative nausea and vomiting)    "patch helped"  . Trigeminal neuralgia   . 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([facility])     Medications:  Medications Prior to Admission  Medication Sig Dispense Refill Last Dose  . acetaminophen (TYLENOL) 325 MG tablet Take 2 tablets (650 mg total) by mouth every  4 (four) hours as needed for mild pain ((score 1 to 3) or temp > 100.5).   05/29/2020 at Unknown time  . diltiazem (CARDIZEM CD) 120 MG 24 hr capsule TAKE (1) CAPSULE BY MOUTH ONCE DAILY. (Patient taking differently: Take 120 mg by mouth daily.) 90 capsule 3 05/30/2020 at Unknown time  . fluticasone (FLONASE) 50 MCG/ACT nasal spray Place 2 sprays into both nostrils daily. (Patient taking differently: Place 2 sprays into both nostrils daily as needed (ears stopped it).) 16 g 5 PRN  . furosemide (LASIX) 40 MG tablet Take 1.5 tablets (60 mg total) by mouth daily. 135 tablet 3 05/30/2020 at Unknown time  . hydrocortisone (ANUSOL-HC) 2.5 % rectal cream APPLY RECTALLY THREE TIMES DAILY AS DIRECTED (Patient taking differently: Place 1 application rectally 2 (two) times daily as needed for hemorrhoids or anal itching.) 30 g 5 PRN  . HYDROmorphone (DILAUDID) 2 MG tablet Take 2-3 mg by mouth every 6 (six) hours as needed for pain.   05/29/2020 at Unknown time  . Lidocaine-Hydrocortisone Ace 3-2.5 % KIT APPLY TO RECTUM QID AS NEEDED FOR RECTAL PAIN OR BLEEDING (Patient taking differently: Place 1 application rectally daily as needed (rectal pain or bleeding).) 1 kit 1 PRN  . LORazepam (ATIVAN) 0.5 MG tablet Take  1 tablet (0.5 mg total) by mouth 2 (two) times daily as needed for anxiety or sleep. TAKE ONE TABLET BY MOUTH UP TO TWICE DAILY AS NEEDED. (Patient taking differently: Take 0.5 mg by mouth 2 (two) times daily as needed for anxiety or sleep.) 60 tablet 5 05/30/2020 at Unknown time  . losartan (COZAAR) 50 MG tablet Take 1 tablet (50 mg total) by mouth daily. Hold 90 tablet 3 05/30/2020 at Unknown time  . methocarbamol (ROBAXIN) 500 MG tablet Take 1 tablet (500 mg total) by mouth every 8 (eight) hours as needed for muscle spasms. 90 tablet 0 Past Month at Unknown time  . metoprolol succinate (TOPROL-XL) 100 MG 24 hr tablet TAKE ONE TABLET BY MOUTH ONCE DAILY. (Patient taking differently: Take 100 mg by mouth daily.)  90 tablet 3 05/30/2020 at 0700  . Multiple Vitamin (MULTIVITAMIN WITH MINERALS) TABS tablet Take 1 tablet by mouth daily. Centrum Silver   Past Week at Unknown time  . Polyethyl Glycol-Propyl Glycol (LUBRICANT EYE DROPS) 0.4-0.3 % SOLN Place 1 drop into both eyes at bedtime.   Past Week at Unknown time  . polyethylene glycol powder (GLYCOLAX/MIRALAX) 17 GM/SCOOP powder Take 17 g daily as needed by mouth for moderate constipation.    PRN  . potassium chloride (KLOR-CON) 10 MEQ tablet Take 20 meq am and 10 mg pm (Patient taking differently: Take 10-20 mEq by mouth See admin instructions. Take 20 meq am and 10 meq at lunch) 90 tablet 3 05/30/2020 at Unknown time  . Rivaroxaban (XARELTO) 15 MG TABS tablet Take 1 tablet (15 mg total) by mouth daily with supper. 90 tablet 1 05/29/2020 at 1800  . shark liver oil-cocoa butter (PREPARATION H) 0.25-3-85.5 % suppository Place 1 suppository rectally 2 (two) times daily as needed for hemorrhoids.    PRN  . zolpidem (AMBIEN) 5 MG tablet TAKE (1) TABLET BY MOUTH AT BEDTIME FOR SLEEP. (Patient taking differently: Take 5 mg by mouth at bedtime as needed for sleep.) 30 tablet 5 PRN  . hydrALAZINE (APRESOLINE) 25 MG tablet Take 1 tablet (25 mg total) by mouth 3 (three) times daily. Hold 270 tablet 3 Past Week  . metolazone (ZAROXOLYN) 2.5 MG tablet Take 1 tablet (2.5 mg total) by mouth as needed (Take for weight gain > 2 lbs overnight or > 5 lbs in one week.). (Patient not taking: No sig reported) 10 tablet 6 Not Taking at Unknown time    Assessment: Pharmacy consulted to dose apixaban in patient with atrial fibrillation and newly diagnosed pulmonary emoblus.  Patient was on Xarelto prior to admission with last dose received 4/13.   Goal of Therapy:   Monitor platelets by anticoagulation protocol: Yes   Plan:  Apixaban 10 mg twice daily x 7 days followed by apixaban 5 mg twice daily thereafter.  Ramond Craver 05/31/2020,5:25 PM

## 2020-05-31 NOTE — Care Management Obs Status (Signed)
MEDICARE OBSERVATION STATUS NOTIFICATION   Patient Details  Name: Katherine Lane MRN: [id number] Date of Birth: Oct 17, 1932   Medicare Observation Status Notification Given:  Yes    Annice Needy, LCSW 05/31/2020, 2:47 PM

## 2020-05-31 NOTE — Progress Notes (Addendum)
Progress Note  Patient Name: Katherine Lane Date of Encounter: 05/31/2020  [facility] Cardiologist: Nona DellSamuel McDowell, MD   Subjective   Back sore   No CP  Mild SOB  Inpatient Medications    Scheduled Meds: . diltiazem  120 mg Oral Daily  . furosemide  40 mg Intravenous Q12H  . losartan  25 mg Oral Daily  . metoprolol succinate  100 mg Oral Daily  . polyvinyl alcohol  1 drop Both Eyes QHS  . potassium chloride  20 mEq Oral Daily  . Rivaroxaban  15 mg Oral Q supper  . saccharomyces boulardii  250 mg Oral BID  . sodium chloride flush  3 mL Intravenous Q12H   Continuous Infusions: . sodium chloride     PRN Meds: sodium chloride, acetaminophen, fluticasone, HYDROmorphone, methocarbamol, ondansetron (ZOFRAN) IV, sodium chloride flush, zolpidem   Vital Signs    Vitals:   05/30/20 1415 05/30/20 1415 05/30/20 2038 05/31/20 0425  BP: (!) 124/91 (!) 124/91 110/85 (!) 122/91  Pulse: (!) 57 91 60 (!) 104  Resp: 19  19 20   Temp: 98 F (36.7 C) 98 F (36.7 C) (!) 97.2 F (36.2 C) (!) 97.3 F (36.3 C)  TempSrc: Oral Oral    SpO2: 99% 99% 100% 96%  Weight:      Height:        Intake/Output Summary (Last 24 hours) at 05/31/2020 1021 Last data filed at 05/31/2020 0800 Gross per 24 hour  Intake 240 ml  Output 450 ml  Net -210 ml   Last 3 Weights 05/30/2020 05/15/2020 04/20/2020  Weight (lbs) 120 lb 6.4 oz 118 lb 117 lb 15.1 oz  Weight (kg) 54.613 kg 53.524 kg 53.5 kg      Telemetry    Afib    - Personally Reviewed  ECG    No new - Personally Reviewed  Physical Exam   GEN: Thin 87 yo in no acute distress.   Neck: JVP is increased  Cardiac: Irreg irreg  II/VI systolic murmur LSB  Respiratory: Decreased BS at bases  GI: Soft, nontender, non-distended  MS: 1-2+ edema; No deformity. Neuro:  Nonfocal  Psych: Normal affect   Labs    High Sensitivity Troponin:   Recent Labs  Lab 05/30/20 0941 05/30/20 1137  TROPONINIHS 21* 21*      Chemistry Recent Labs   Lab 05/30/20 0941 05/31/20 0410  NA 138 138  K 3.7 3.5  CL 102 99  CO2 21* 26  GLUCOSE 96 81  BUN 42* 43*  CREATININE 1.69* 1.63*  CALCIUM 8.9 8.9  PROT 6.3*  --   ALBUMIN 3.5  --   AST 33  --   ALT 20  --   ALKPHOS 139*  --   BILITOT 1.6*  --   GFRNONAA 29* 31*  ANIONGAP 15 13     Hematology Recent Labs  Lab 05/30/20 0941  WBC 7.4  RBC 4.64  HGB 11.5*  HCT 39.3  MCV 84.7  MCH 24.8*  MCHC 29.3*  RDW 24.8*  PLT 238    BNP Recent Labs  Lab 05/30/20 0946  BNP 2,187.0*     DDimer No results for input(s): DDIMER in the last 168 hours.   Radiology    DG Chest Portable 1 View  Result Date: 05/30/2020 CLINICAL DATA:  Weakness. EXAM: PORTABLE CHEST 1 VIEW COMPARISON:  12/30/2019 and older studies. FINDINGS: Mild stable enlargement of the cardiopericardial silhouette. No mediastinal or hilar masses. Prominent interstitial markings. Calcification  noted in the right lower hemithorax. These findings are stable. No evidence of pneumonia or pulmonary edema. No convincing pleural effusion and no pneumothorax. Skeletal structures are demineralized. There are multiple old vertebral fractures with 7 visualized treated with previous vertebroplasty. IMPRESSION: 1. No acute cardiopulmonary disease. 2. Chronic interstitial prominence in the lungs. Stable mild cardiomegaly. Electronically Signed   By: Amie Portland M.D.   On: 05/30/2020 10:12   ECHOCARDIOGRAM COMPLETE  Result Date: 05/30/2020    ECHOCARDIOGRAM REPORT   Patient Name:   Katherine Lane Date of Exam: 05/30/2020 Medical Rec #:  [id number]      Height:       60.0 in Accession #:    [id number]     Weight:       120.4 lb Date of Birth:  [birth year]     BSA:          1.504 m Patient Age:    87 years       BP:           124/91 mmHg Patient Gender: F              HR:           91 bpm. [facility] Procedure: 2D Echo Indications:    CHF-Acute Diastolic I50.31  History:        Patient has prior history of Echocardiogram  examinations, most                 recent 09/28/2019. CHF, Arrythmias:Atrial Flutter and Atrial                 Fibrillation; Risk Factors:Hypertension and Non-Smoker.  Sonographer:    Jeryl Columbia RDCS (AE) Referring Phys: [id number] Katherine Lane IMPRESSIONS  1. Left ventricular ejection fraction, by estimation, is 60 to 65%. The left ventricle has normal function. The left ventricle has no regional wall motion abnormalities. There is mild left ventricular hypertrophy. Left ventricular diastolic parameters are indeterminate.  2. Right ventricular systolic function is moderately reduced. The right ventricular size is moderately enlarged.  3. Left atrial size was moderately dilated.  4. Right atrial size was severely dilated.  5. The mitral valve is degenerative. Trivial mitral valve regurgitation. No evidence of mitral stenosis. Moderate mitral annular calcification.  6. Estimated PA pressure elevated and higher than estimate on echo done 09/28/19 . Tricuspid valve regurgitation is moderate.  7. The aortic valve is calcified. There is moderate calcification of the aortic valve. Aortic valve regurgitation is mild. Mild to moderate aortic valve sclerosis/calcification is present, without any evidence of aortic stenosis.  8. The inferior vena cava is dilated in size with >50% respiratory variability, suggesting right atrial pressure of 8 mmHg. FINDINGS  Left Ventricle: Left ventricular ejection fraction, by estimation, is 60 to 65%. The left ventricle has normal function. The left ventricle has no regional wall motion abnormalities. The left ventricular internal cavity size was normal in size. There is  mild left ventricular hypertrophy. Left ventricular diastolic parameters are indeterminate. Right Ventricle: The right ventricular size is moderately enlarged. Right vetricular wall thickness was not assessed. Right ventricular systolic function is moderately reduced. Left Atrium: Left atrial size was moderately dilated.  Right Atrium: Right atrial size was severely dilated. Pericardium: There is no evidence of pericardial effusion. Mitral Valve: The mitral valve is degenerative in appearance. There is moderate thickening of the mitral valve leaflet(s). There is moderate calcification of the mitral valve leaflet(s). Moderate mitral annular calcification.  Trivial mitral valve regurgitation. No evidence of mitral valve stenosis. Tricuspid Valve: Estimated PA pressure elevated and higher than estimate on echo done 09/28/19. The tricuspid valve is normal in structure. Tricuspid valve regurgitation is moderate . No evidence of tricuspid stenosis. Aortic Valve: The aortic valve is calcified. There is moderate calcification of the aortic valve. Aortic valve regurgitation is mild. Mild to moderate aortic valve sclerosis/calcification is present, without any evidence of aortic stenosis. Pulmonic Valve: The pulmonic valve was normal in structure. Pulmonic valve regurgitation is not visualized. No evidence of pulmonic stenosis. Aorta: The aortic root is normal in size and structure. Venous: The inferior vena cava is dilated in size with greater than 50% respiratory variability, suggesting right atrial pressure of 8 mmHg. IAS/Shunts: No atrial level shunt detected by color flow Doppler.  LEFT VENTRICLE PLAX 2D LVIDd:         2.10 cm  Diastology LVIDs:         1.68 cm  LV e' medial:    5.18 cm/s LV PW:         1.68 cm  LV E/e' medial:  16.5 LV IVS:        1.26 cm  LV e' lateral:   5.85 cm/s LVOT diam:     1.80 cm  LV E/e' lateral: 14.6 LVOT Area:     2.54 cm  RIGHT VENTRICLE RV S prime:     8.03 cm/s TAPSE (M-mode): 1.6 cm LEFT ATRIUM             Index       RIGHT ATRIUM           Index LA diam:        3.80 cm 2.53 cm/m  RA Area:     17.30 cm LA Vol (A2C):   51.2 ml 34.03 ml/m RA Volume:   47.20 ml  31.37 ml/m LA Vol (A4C):   35.8 ml 23.80 ml/m LA Biplane Vol: 44.1 ml 29.31 ml/m   AORTA Ao Root diam: 2.90 cm MITRAL VALVE                TRICUSPID VALVE MV Area (PHT): 4.44 cm    TR Peak grad:   68.9 mmHg MV Decel Time: 171 msec    TR Vmax:        415.00 cm/s MV E velocity: 85.30 cm/s MV A velocity: 30.00 cm/s  SHUNTS MV E/A ratio:  2.84        Systemic Diam: 1.80 cm Katherine Haws MD Electronically signed by Katherine Haws MD Signature Date/Time: 05/30/2020/4:47:03 PM    Final     Cardiac Studies   Echo  05/30/20  1. Left ventricular ejection fraction, by estimation, is 60 to 65%. The left ventricle has normal function. The left ventricle has no regional wall motion abnormalities. There is mild left ventricular hypertrophy. Left ventricular diastolic parameters are indeterminate. 2. Right ventricular systolic function is moderately reduced. The right ventricular size is moderately enlarged. 3. Left atrial size was moderately dilated. 4. Right atrial size was severely dilated. 5. The mitral valve is degenerative. Trivial mitral valve regurgitation. No evidence of mitral stenosis. Moderate mitral annular calcification. 6. Estimated PA pressure elevated and higher than estimate on echo done 09/28/19 . Tricuspid valve regurgitation is moderate. 7. The aortic valve is calcified. There is moderate calcification of the aortic valve. Aortic valve regurgitation is mild. Mild to moderate aortic valve sclerosis/calcification is present, without any evidence of aortic stenosis. 8. The inferior vena  cava is dilated in size with >50% respiratory variability, suggesting right atrial pressure of 8 mmHg.  Patient Profile      Katherine Lane is a 87 y.o. female with a hx of chronic diastolic CHF, permanentatrial fibrillation, HTN, and chronic back pain who is being seen today for the evaluation of CHF at the request of Dr. Denton Lank.   Assessment & Plan    1  CHF   Pt still with evid of increased volume today   I have reviewed echo image from yesterdays study and  Compared to previous echo from Aug 2021  LVEF is normal   AV is thickend but  does not appear stenoditc  Atria are enlarged  The big difference is the  RV is enlarged now and function is more depressed   PAP is severely elevated   (approx 70 mm Hg) Concerning, could pt have had a PE even though she is taking Eliquis  I would consider CT of chest to r/o PE, chronic PE as well) Continue diuresis   2  Atrial fibrillation  Continue rate control and anticoagulation..  May need to adjust meds some   Offi of diltiazem given RV dysfunciton and increase toprol XL  Wont change right now.    For questions or updates, please contact [facility] Please consult [email or web address] for contact info under        Signed, Dietrich Pates, MD  05/31/2020, 10:21 AM

## 2020-05-31 NOTE — Evaluation (Addendum)
Physical Therapy Evaluation Patient Details Name: Katherine Lane MRN: [id number] DOB: [birth year] Today's Date: 05/31/2020   History of Present Illness  Katherine Lane is a 87 y.o. female with medical history significant of chronic atrial fibrillation, hypertension, chronic diastolic heart failure, chronic kidney disease a stage IIIb-IV (transition point) and chronic back pain (with recent back surgery, kyphoplasty procedure x2 during the month of March); daughter reported ongoing weakness, shortness of breath with minimal activity, weight gain of more than 5 pounds and lower extremity swelling.  There has not been any specific complaints of palpitations or chest pain.  She reported contacting cardiology service with recommendations for extra dose of Zaroxolyn which did not fix the problem.  Patient's daughter indicates increased lethargy, weakness and intermittent diarrhea for the last 2 weeks or so.  Has not been any nausea, vomiting, abdominal pain, hematochezia, melena, dysuria, focal neurologic deficits, headaches or any other complaints.   Patient continued to have back pain, which according to patient's daughter is better than what it was before, still requiring as needed hydromorphone.    Clinical Impression  Patient functioning near baseline for functional mobility and gait demonstrating good return for log rolling and sitting up at bedside using side rail, transferring to/from commode in bathroom, standing in front of sink to wash hands, and ambulation in room/hallway without loss of balance.  Patient on room air during ambulation with SpO2 dropping from 96% to 85%, once seated in chair SpO2 gradually increased to 94-95%.  Patient tolerated staying up in chair after therapy - RN/MD aware.  Patient will benefit from continued physical therapy in hospital and recommended venue below to increase strength, balance, endurance for safe ADLs and gait.      Follow Up Recommendations Home health  PT;Supervision for mobility/OOB;Supervision - Intermittent    Equipment Recommendations  None recommended by PT    Recommendations for Other Services       Precautions / Restrictions Precautions Precautions: Fall Restrictions Weight Bearing Restrictions: No      Mobility  Bed Mobility Overal bed mobility: Needs Assistance Bed Mobility: Rolling;Sidelying to Sit Rolling: Supervision Sidelying to sit: Supervision;HOB elevated       General bed mobility comments: HOB slightly raised and required use of bed rail, overal demonstrates good return for rolling to side and sitting up from sidelying position    Transfers Overall transfer level: Needs assistance Equipment used: Rolling walker (2 wheeled) Transfers: Sit to/from [employer] Sit to Stand: Supervision Stand pivot transfers: Supervision;Min guard       General transfer comment: demonstrates good return for transferring to/from commode in bathroom and chair at bedside without loss of balance  Ambulation/Gait Ambulation/Gait assistance: Min guard Gait Distance (Feet): 65 Feet Assistive device: Rolling walker (2 wheeled) Gait Pattern/deviations: Decreased step length - right;Decreased step length - left;Decreased stride length Gait velocity: decreased   General Gait Details: slightly labored cadence without loss of balance, limited mostly due to fatigue, on room air with SpO2 dropping from 96% to 85%  Stairs            Wheelchair Mobility    Modified Rankin (Stroke Patients Only)       Balance Overall balance assessment: Needs assistance Sitting-balance support: Feet supported;No upper extremity supported Sitting balance-Leahy Scale: Good Sitting balance - Comments: seated at EOB   Standing balance support: During functional activity;Bilateral upper extremity supported Standing balance-Leahy Scale: Fair Standing balance comment: fair/good using RW  Pertinent Vitals/Pain Pain Assessment: No/denies pain    Home Living Family/patient expects to be discharged to:: Private residence Living Arrangements: Alone Available Help at Discharge: Family;Friend(s);Personal care attendant;Available PRN/intermittently Type of Home: House Home Access: Stairs to enter Entrance Stairs-Rails: Left;Right;Can reach both Entrance Stairs-Number of Steps: 2 Home Layout: One level Home Equipment: Cane - single point;Toilet riser;Shower seat;Bedside commode;Grab bars - tub/shower;Hand held shower head;Adaptive equipment;Other (comment);Walker - 4 wheels;Wheelchair - manual Additional Comments: Pt has family, friends in/out throughout day to assist. Recently set up a PCA to assist 2-3 days week for 2 hours for showers, meals, etc.    Prior Function Level of Independence: Needs assistance   Gait / Transfers Assistance Needed: Tour manager  ADL's / Homemaking Assistance Needed: assisted by family home aide        Hand Dominance   Dominant Hand: Right    Extremity/Trunk Assessment   Upper Extremity Assessment Upper Extremity Assessment: Overall WFL for tasks assessed    Lower Extremity Assessment Lower Extremity Assessment: Generalized weakness    Cervical / Trunk Assessment Cervical / Trunk Assessment: Kyphotic  Communication   Communication: No difficulties  Cognition Arousal/Alertness: Awake/alert Behavior During Therapy: WFL for tasks assessed/performed Overall Cognitive Status: Within Functional Limits for tasks assessed                                        General Comments      Exercises     Assessment/Plan    PT Assessment Patient needs continued PT services  PT Problem List Decreased strength;Decreased activity tolerance;Decreased balance;Decreased mobility       PT Treatment Interventions DME instruction;Gait training;Stair training;Functional mobility training;Therapeutic  activities;Therapeutic exercise;Patient/family education    PT Goals (Current goals can be found in the Care Plan section)  Acute Rehab PT Goals Patient Stated Goal: return home with family home aides to assist PT Goal Formulation: With patient Time For Goal Achievement: 06/04/20 Potential to Achieve Goals: Good    Frequency Min 3X/week   Barriers to discharge        Co-evaluation               AM-PAC PT "6 Clicks" Mobility  Outcome Measure Help needed turning from your back to your side while in a flat bed without using bedrails?: None Help needed moving from lying on your back to sitting on the side of a flat bed without using bedrails?: A Little Help needed moving to and from a bed to a chair (including a wheelchair)?: A Little Help needed standing up from a chair using your arms (e.g., wheelchair or bedside chair)?: A Little Help needed to walk in hospital room?: A Little Help needed climbing 3-5 steps with a railing? : A Little 6 Click Score: 19    End of Session   Activity Tolerance: Patient tolerated treatment well;Patient limited by fatigue Patient left: in chair;with call bell/phone within reach Nurse Communication: Mobility status PT Visit Diagnosis: Unsteadiness on feet (R26.81);Muscle weakness (generalized) (M62.81);Other abnormalities of gait and mobility (R26.89)    Time: 0930-1005 PT Time Calculation (min) (ACUTE ONLY): 35 min   Charges:   PT Evaluation $PT Eval Moderate Complexity: 1 Mod PT Treatments $Therapeutic Activity: 23-37 mins        2:40 PM, 05/31/20 Ocie Bob, MPT Physical Therapist with [facility] 336 [phone] office [phone] mobile phone

## 2020-05-31 NOTE — Progress Notes (Signed)
PROGRESS NOTE    Katherine Lane  ZOX:[id number] DOB: 1932/03/06 DOA: 05/30/2020 PCP: Babs Sciara, MD   Chief complaint: Acute on chronic diastolic heart failure  Brief admission narrative:  Katherine Lane is a 87 y.o. female with medical history significant of chronic atrial fibrillation, hypertension, chronic diastolic heart failure, chronic kidney disease a stage IIIb-IV (transition point) and chronic back pain (with recent back surgery, kyphoplasty procedure x2 during the month of March); daughter reported ongoing weakness, shortness of breath with minimal activity, weight gain of more than 5 pounds and lower extremity swelling.  There has not been any specific complaints of palpitations or chest pain.  She reported contacting cardiology service with recommendations for extra dose of Zaroxolyn which did not fix the problem.  Patient's daughter indicates increased lethargy, weakness and intermittent diarrhea for the last 2 weeks or so. Has not been any nausea, vomiting, abdominal pain, hematochezia, melena, dysuria, focal neurologic deficits, headaches or any other complaints.  Patient continued to have back pain, which according to patient's daughter is better than what it was before, still requiring as needed hydromorphone.  ED Course: Patient was found with elevated BNP, increased lower extremity swelling, borderline low oxygen saturation on room air 88-89% and using 2-3 L nasal cannula supplementation.  Troponin minimally elevated x2 in a flat fashion.  Case was discussed with cardiology service who recommended tune up admission for CHF exacerbation.  Assessment & Plan: 1-mild acute on chronic diastolic heart failure -2D echo demonstrating increased pulmonary artery pressure and right heart failure -Following cardiology recommendations CT angiogram to rule out PE was ordered, positive findings demonstrating left lower segment pulmonary embolism appreciated -Patient was chronically;  this medication was abruptly stopped for couple days while having kyphoplasty procedures last month.  Unclear if he is full failure to medication versus lack of protection while stopping the drug. -In the setting of the doubt she will be switch to Eliquis for better protection and less issues with dose with CKD. -Continue to follow low-sodium diet, daily weights and IV diuresis. -Patient reported good urine output and is currently not requiring oxygen supplementation.  2-chronic atrial fibrillation -Rate controlled -Continue Cardizem, metoprolol and now Eliquis for anticoagulation. -Continue telemetry monitoring  3-chronic kidney disease stage IIIb-IV -Remains a stable and at baseline -Continue close monitoring of diuresis -Will minimize nephrotoxic agents as much as possible.  4-hypertension -Stable vital -Continue current antihypertensive agents -Continue heart healthy diet -Follow vital signs.  5-history of diarrhea -Remains without nausea, vomiting or abdominal pain -Patient without diarrhea episodes since admission. -Continue symptomatic management.  6-chronic back pain -Status post kyphoplasty x2 -Continue as needed analgesia -Physical therapy has evaluated patient recommendations given for home health PT -Continue outpatient follow-up with neurosurgery.  7-insomnia -Continue the use of Ambien.   DVT prophylaxis: Prior to admission was chronically on Xarelto; in the setting of pulmonary embolism while using previous anticoagulation patient has been started on Eliquis. Code Status: Full code. Family Communication: Unable to reach daughter over the phone; no family members at bedside. Disposition:   Status is: Observation  Dispo: The patient is from: Home              Anticipated d/c is to: Home with home health services.              Patient currently no medically stable for discharge; still shortness of breath with activity has been reported; new findings of  pulmonary embolism along with abnormal 2D echo suggesting right-sided failure  and increased pulmonary artery pressure.  Follow lower extremity Dopplers.  Continue IV diuresis for another 24 hours.  Continue to follow response.  Physical therapy has recommended home health services.   Difficult to place patient no      Consultants:   Cardiology service.   Procedures:  See below for x-ray reports.  Antimicrobials:  None  Subjective: No fever, no chest pain, no nausea, no vomiting.  Still complaining of shortness of breath with activity.  Reports improvement in her back pain.  No diarrhea since admission reported.  Objective: Vitals:   05/30/20 2038 05/31/20 0425 05/31/20 1144 05/31/20 1344  BP: 110/85 (!) 122/91  99/63  Pulse: 60 (!) 104  79  Resp: 19 20  16   Temp: (!) 97.2 F (36.2 C) (!) 97.3 F (36.3 C)  98.5 F (36.9 C)  TempSrc:    Oral  SpO2: 100% 96% 100% 92%  Weight:      Height:        Intake/Output Summary (Last 24 hours) at 05/31/2020 1856 Last data filed at 05/31/2020 1800 Gross per 24 hour  Intake 720 ml  Output --  Net 720 ml   Filed Weights   05/30/20 0940  Weight: 54.6 kg    Examination:  General exam: Afebrile, no chest pain, no nausea, no vomiting.  Still short of breath with activity and demonstrating mild tachypnea.  Currently not requiring oxygen supplementation.  Patient expressed improvement in her back pain. Respiratory system: No wheezing, no frank crackles; decreased breath sounds at the bases. Cardiovascular system: Irregular, no rubs, no gallops, no JVD on exam. Gastrointestinal system: Abdomen is nondistended, soft and nontender. No organomegaly or masses felt. Normal bowel sounds heard. Central nervous system: Alert and oriented. No focal neurological deficits. Extremities: 1+ edema appreciated bilaterally; no cyanosis or clubbing.  Patient expressed no tenderness of the patient. Skin: No petechiae. Psychiatry: Mood & affect  appropriate.    Data Reviewed: I have personally reviewed following labs and imaging studies  CBC: Recent Labs  Lab 05/30/20 0941  WBC 7.4  NEUTROABS 4.7  HGB 11.5*  HCT 39.3  MCV 84.7  PLT 238    Basic Metabolic Panel: Recent Labs  Lab 05/30/20 0941 05/30/20 1137 05/31/20 0410  NA 138  --  138  K 3.7  --  3.5  CL 102  --  99  CO2 21*  --  26  GLUCOSE 96  --  81  BUN 42*  --  43*  CREATININE 1.69*  --  1.63*  CALCIUM 8.9  --  8.9  MG  --  2.3  --     GFR: Estimated Creatinine Clearance: 19.6 mL/min (A) (by C-G formula based on SCr of 1.63 mg/dL (H)).  Liver Function Tests: Recent Labs  Lab 05/30/20 0941  AST 33  ALT 20  ALKPHOS 139*  BILITOT 1.6*  PROT 6.3*  ALBUMIN 3.5    CBG: No results for input(s): GLUCAP in the last 168 hours.   Recent Results (from the past 240 hour(s))  Resp Panel by RT-PCR (Flu A&B, Covid) Nasopharyngeal Swab     Status: None   Collection Time: 05/30/20  9:45 AM   Specimen: Nasopharyngeal Swab; Nasopharyngeal(NP) swabs in vial transport medium  Result Value Ref Range Status   SARS Coronavirus 2 by RT PCR NEGATIVE NEGATIVE Final    Comment: (NOTE) SARS-CoV-2 target nucleic acids are NOT DETECTED.  The SARS-CoV-2 RNA is generally detectable in upper respiratory specimens during the acute phase  of infection. The lowest concentration of SARS-CoV-2 viral copies this assay can detect is 138 copies/mL. A negative result does not preclude SARS-Cov-2 infection and should not be used as the sole basis for treatment or other patient management decisions. A negative result may occur with  improper specimen collection/handling, submission of specimen other than nasopharyngeal swab, presence of viral mutation(s) within the areas targeted by this assay, and inadequate number of viral copies(<138 copies/mL). A negative result must be combined with clinical observations, patient history, and epidemiological information. The expected  result is Negative.  Fact Sheet for Patients:  BloggerCourse.com  Fact Sheet for Healthcare Providers:  SeriousBroker.it  This test is no t yet approved or cleared by the [place] FDA and  has been authorized for detection and/or diagnosis of SARS-CoV-2 by FDA under an Emergency Use Authorization (EUA). This EUA will remain  in effect (meaning this test can be used) for the duration of the COVID-19 declaration under Section 564(b)(1) of the Act, 21 U.S.C.section 360bbb-3(b)(1), unless the authorization is terminated  or revoked sooner.       Influenza A by PCR NEGATIVE NEGATIVE Final   Influenza B by PCR NEGATIVE NEGATIVE Final    Comment: (NOTE) The Xpert Xpress SARS-CoV-2/FLU/RSV plus assay is intended as an aid in the diagnosis of influenza from Nasopharyngeal swab specimens and should not be used as a sole basis for treatment. Nasal washings and aspirates are unacceptable for Xpert Xpress SARS-CoV-2/FLU/RSV testing.  Fact Sheet for Patients: BloggerCourse.com  Fact Sheet for Healthcare Providers: SeriousBroker.it  This test is not yet approved or cleared by the [place] FDA and has been authorized for detection and/or diagnosis of SARS-CoV-2 by FDA under an Emergency Use Authorization (EUA). This EUA will remain in effect (meaning this test can be used) for the duration of the COVID-19 declaration under Section 564(b)(1) of the Act, 21 U.S.C. section 360bbb-3(b)(1), unless the authorization is terminated or revoked.  Performed at [facility], [address], [place], [place] [address]      Radiology Studies: CT ANGIO CHEST PE W OR WO CONTRAST  Result Date: 05/31/2020 CLINICAL DATA:  PE suspected EXAM: CT ANGIOGRAPHY CHEST WITH CONTRAST TECHNIQUE: Multidetector CT imaging of the chest was performed using the standard protocol during bolus administration of  intravenous contrast. Multiplanar CT image reconstructions and MIPs were obtained to evaluate the vascular anatomy. CONTRAST:  61mL OMNIPAQUE IOHEXOL 350 MG/ML SOLN COMPARISON:  None. FINDINGS: Cardiovascular: Satisfactory opacification of the pulmonary arteries to the segmental level. Positive examination for pulmonary embolism, with segmental to subsegmental embolus identified in the posterior segment left lower lobe (series 4, image 54). No other embolus noted. Cardiomegaly. Enlargement of the main pulmonary artery measuring up to 4.1 cm in caliber. Aortic atherosclerosis. No pericardial effusion. Mediastinum/Nodes: Prominent, calcified mediastinal lymph nodes, in keeping with prior granulomatous infection. Thyroid gland, trachea, and esophagus demonstrate no significant findings. Lungs/Pleura: Small right pleural effusion with associated atelectasis or consolidation. Benign, calcified nodules of the lateral segment right middle lobe (series 6, image 79). Upper Abdomen: No acute abnormality. Benign adenomatous thickening of the bilateral adrenal glands. Musculoskeletal: No chest wall abnormality. No acute or significant osseous findings. Review of the MIP images confirms the above findings. IMPRESSION: 1. Positive examination for pulmonary embolism, with segmental to subsegmental embolus identified in the posterior segment left lower lobe. No other embolus noted. 2. Small right pleural effusion with associated atelectasis or consolidation. 3. Cardiomegaly. 4. Enlargement of the main pulmonary artery measuring up to 4.1  cm in caliber, as can be seen in pulmonary hypertension. Call report request was placed at the time of dictation. Final communication will be documented. Aortic Atherosclerosis (ICD10-I70.0). Electronically Signed   By: Lauralyn Primes M.D.   On: 05/31/2020 16:59   DG Chest Portable 1 View  Result Date: 05/30/2020 CLINICAL DATA:  Weakness. EXAM: PORTABLE CHEST 1 VIEW COMPARISON:  12/30/2019 and  older studies. FINDINGS: Mild stable enlargement of the cardiopericardial silhouette. No mediastinal or hilar masses. Prominent interstitial markings. Calcification noted in the right lower hemithorax. These findings are stable. No evidence of pneumonia or pulmonary edema. No convincing pleural effusion and no pneumothorax. Skeletal structures are demineralized. There are multiple old vertebral fractures with 7 visualized treated with previous vertebroplasty. IMPRESSION: 1. No acute cardiopulmonary disease. 2. Chronic interstitial prominence in the lungs. Stable mild cardiomegaly. Electronically Signed   By: Amie Portland M.D.   On: 05/30/2020 10:12   ECHOCARDIOGRAM COMPLETE  Result Date: 05/30/2020    ECHOCARDIOGRAM REPORT   Patient Name:   SAOIRSE LEGERE Date of Exam: 05/30/2020 Medical Rec #:  [id number]      Height:       60.0 in Accession #:    [id number]     Weight:       120.4 lb Date of Birth:  [birth year]     BSA:          1.504 m Patient Age:    87 years       BP:           124/91 mmHg Patient Gender: F              HR:           91 bpm. [facility] Procedure: 2D Echo Indications:    CHF-Acute Diastolic I50.31  History:        Patient has prior history of Echocardiogram examinations, most                 recent 09/28/2019. CHF, Arrythmias:Atrial Flutter and Atrial                 Fibrillation; Risk Factors:Hypertension and Non-Smoker.  Sonographer:    Jeryl Columbia RDCS (AE) Referring Phys: [id number] Nature Kueker IMPRESSIONS  1. Left ventricular ejection fraction, by estimation, is 60 to 65%. The left ventricle has normal function. The left ventricle has no regional wall motion abnormalities. There is mild left ventricular hypertrophy. Left ventricular diastolic parameters are indeterminate.  2. Right ventricular systolic function is moderately reduced. The right ventricular size is moderately enlarged.  3. Left atrial size was moderately dilated.  4. Right atrial size was severely dilated.   5. The mitral valve is degenerative. Trivial mitral valve regurgitation. No evidence of mitral stenosis. Moderate mitral annular calcification.  6. Estimated PA pressure elevated and higher than estimate on echo done 09/28/19 . Tricuspid valve regurgitation is moderate.  7. The aortic valve is calcified. There is moderate calcification of the aortic valve. Aortic valve regurgitation is mild. Mild to moderate aortic valve sclerosis/calcification is present, without any evidence of aortic stenosis.  8. The inferior vena cava is dilated in size with >50% respiratory variability, suggesting right atrial pressure of 8 mmHg. FINDINGS  Left Ventricle: Left ventricular ejection fraction, by estimation, is 60 to 65%. The left ventricle has normal function. The left ventricle has no regional wall motion abnormalities. The left ventricular internal cavity size was normal in size. There is  mild left ventricular  hypertrophy. Left ventricular diastolic parameters are indeterminate. Right Ventricle: The right ventricular size is moderately enlarged. Right vetricular wall thickness was not assessed. Right ventricular systolic function is moderately reduced. Left Atrium: Left atrial size was moderately dilated. Right Atrium: Right atrial size was severely dilated. Pericardium: There is no evidence of pericardial effusion. Mitral Valve: The mitral valve is degenerative in appearance. There is moderate thickening of the mitral valve leaflet(s). There is moderate calcification of the mitral valve leaflet(s). Moderate mitral annular calcification. Trivial mitral valve regurgitation. No evidence of mitral valve stenosis. Tricuspid Valve: Estimated PA pressure elevated and higher than estimate on echo done 09/28/19. The tricuspid valve is normal in structure. Tricuspid valve regurgitation is moderate . No evidence of tricuspid stenosis. Aortic Valve: The aortic valve is calcified. There is moderate calcification of the aortic valve. Aortic  valve regurgitation is mild. Mild to moderate aortic valve sclerosis/calcification is present, without any evidence of aortic stenosis. Pulmonic Valve: The pulmonic valve was normal in structure. Pulmonic valve regurgitation is not visualized. No evidence of pulmonic stenosis. Aorta: The aortic root is normal in size and structure. Venous: The inferior vena cava is dilated in size with greater than 50% respiratory variability, suggesting right atrial pressure of 8 mmHg. IAS/Shunts: No atrial level shunt detected by color flow Doppler.  LEFT VENTRICLE PLAX 2D LVIDd:         2.10 cm  Diastology LVIDs:         1.68 cm  LV e' medial:    5.18 cm/s LV PW:         1.68 cm  LV E/e' medial:  16.5 LV IVS:        1.26 cm  LV e' lateral:   5.85 cm/s LVOT diam:     1.80 cm  LV E/e' lateral: 14.6 LVOT Area:     2.54 cm  RIGHT VENTRICLE RV S prime:     8.03 cm/s TAPSE (M-mode): 1.6 cm LEFT ATRIUM             Index       RIGHT ATRIUM           Index LA diam:        3.80 cm 2.53 cm/m  RA Area:     17.30 cm LA Vol (A2C):   51.2 ml 34.03 ml/m RA Volume:   47.20 ml  31.37 ml/m LA Vol (A4C):   35.8 ml 23.80 ml/m LA Biplane Vol: 44.1 ml 29.31 ml/m   AORTA Ao Root diam: 2.90 cm MITRAL VALVE               TRICUSPID VALVE MV Area (PHT): 4.44 cm    TR Peak grad:   68.9 mmHg MV Decel Time: 171 msec    TR Vmax:        415.00 cm/s MV E velocity: 85.30 cm/s MV A velocity: 30.00 cm/s  SHUNTS MV E/A ratio:  2.84        Systemic Diam: 1.80 cm Charlton HawsPeter Nishan MD Electronically signed by Charlton HawsPeter Nishan MD Signature Date/Time: 05/30/2020/4:47:03 PM    Final    Scheduled Meds: . apixaban  10 mg Oral BID   Followed by  . [START ON 06/07/2020] apixaban  5 mg Oral BID  . diltiazem  120 mg Oral Daily  . furosemide  60 mg Intravenous Once  . [START ON 06/01/2020] furosemide  40 mg Oral BID  . losartan  25 mg Oral Daily  . metoprolol succinate  100 mg  Oral Daily  . polyvinyl alcohol  1 drop Both Eyes QHS  . potassium chloride  20 mEq Oral Daily   . saccharomyces boulardii  250 mg Oral BID  . sodium chloride flush  3 mL Intravenous Q12H   Continuous Infusions: . sodium chloride       LOS: 0 days    Time spent: 35 minutes  Vassie Loll, MD [facility]   To contact the attending provider between 7A-7P or the covering provider during after hours 7P-7A, please log into the web site [email or web address] and access using universal [place] password for that web site. If you do not have the password, please call the hospital operator.  05/31/2020, 6:56 PM

## 2020-05-31 NOTE — Progress Notes (Signed)
SATURATION QUALIFICATIONS: (This note is used to comply with regulatory documentation for home oxygen)  Patient Saturations on Room Air at Rest = 98%  Patient Saturations on Room Air while Ambulating = 100%    

## 2020-06-01 ENCOUNTER — Other Ambulatory Visit (HOSPITAL_COMMUNITY): Payer: Self-pay

## 2020-06-01 ENCOUNTER — Observation Stay (HOSPITAL_COMMUNITY): Payer: Medicare PPO

## 2020-06-01 DIAGNOSIS — I482 Chronic atrial fibrillation, unspecified: Secondary | ICD-10-CM | POA: Diagnosis not present

## 2020-06-01 DIAGNOSIS — N1832 Chronic kidney disease, stage 3b: Secondary | ICD-10-CM | POA: Diagnosis not present

## 2020-06-01 DIAGNOSIS — J9601 Acute respiratory failure with hypoxia: Secondary | ICD-10-CM

## 2020-06-01 DIAGNOSIS — R0602 Shortness of breath: Secondary | ICD-10-CM | POA: Diagnosis not present

## 2020-06-01 DIAGNOSIS — M545 Low back pain, unspecified: Secondary | ICD-10-CM | POA: Diagnosis not present

## 2020-06-01 DIAGNOSIS — I2699 Other pulmonary embolism without acute cor pulmonale: Secondary | ICD-10-CM

## 2020-06-01 DIAGNOSIS — I10 Essential (primary) hypertension: Secondary | ICD-10-CM | POA: Diagnosis not present

## 2020-06-01 DIAGNOSIS — I5033 Acute on chronic diastolic (congestive) heart failure: Secondary | ICD-10-CM | POA: Diagnosis not present

## 2020-06-01 DIAGNOSIS — I2693 Single subsegmental pulmonary embolism without acute cor pulmonale: Secondary | ICD-10-CM | POA: Diagnosis not present

## 2020-06-01 DIAGNOSIS — R197 Diarrhea, unspecified: Secondary | ICD-10-CM | POA: Diagnosis not present

## 2020-06-01 DIAGNOSIS — N183 Chronic kidney disease, stage 3 unspecified: Secondary | ICD-10-CM | POA: Diagnosis not present

## 2020-06-01 LAB — BASIC METABOLIC PANEL
Anion gap: 13 (ref 5–15)
BUN: 44 mg/dL — ABNORMAL HIGH (ref 8–23)
CO2: 30 mmol/L (ref 22–32)
Calcium: 9 mg/dL (ref 8.9–10.3)
Chloride: 95 mmol/L — ABNORMAL LOW (ref 98–111)
Creatinine, Ser: 1.47 mg/dL — ABNORMAL HIGH (ref 0.44–1.00)
GFR, Estimated: 35 mL/min — ABNORMAL LOW (ref >60)
Glucose, Bld: 83 mg/dL (ref 70–99)
Potassium: 3.2 mmol/L — ABNORMAL LOW (ref 3.5–5.1)
Sodium: 138 mmol/L (ref 135–145)

## 2020-06-01 LAB — MAGNESIUM: Magnesium: 2.2 mg/dL (ref 1.7–2.4)

## 2020-06-01 MED ORDER — POLYETHYLENE GLYCOL 3350 17 GM/SCOOP PO POWD: 17.0000 g | Freq: Every day | ORAL | Status: DC | PRN

## 2020-06-01 MED ORDER — LOSARTAN POTASSIUM 50 MG PO TABS: 25.0000 mg | ORAL_TABLET | Freq: Every day | ORAL | Status: DC

## 2020-06-01 MED ORDER — APIXABAN 5 MG PO TABS: ORAL_TABLET | ORAL | 3 refills | Status: DC

## 2020-06-01 MED ORDER — FUROSEMIDE 10 MG/ML IJ SOLN
60.0000 mg | Freq: Once | INTRAMUSCULAR | Status: AC
  Administered 2020-06-01: 60 mg via INTRAVENOUS
  Filled 2020-06-01: qty 6

## 2020-06-01 MED ORDER — SACCHAROMYCES BOULARDII 250 MG PO CAPS: 250.0000 mg | ORAL_CAPSULE | Freq: Two times a day (BID) | ORAL | 0 refills | Status: DC

## 2020-06-01 MED ORDER — POTASSIUM CHLORIDE ER 10 MEQ PO TBCR: 20.0000 meq | EXTENDED_RELEASE_TABLET | Freq: Two times a day (BID) | ORAL | 3 refills | Status: DC

## 2020-06-01 MED ORDER — FUROSEMIDE 40 MG PO TABS: 40.0000 mg | ORAL_TABLET | Freq: Two times a day (BID) | ORAL | Status: DC

## 2020-06-01 MED ORDER — FUROSEMIDE 40 MG PO TABS
40.0000 mg | ORAL_TABLET | Freq: Once | ORAL | Status: AC
  Administered 2020-06-01: 40 mg via ORAL
  Filled 2020-06-01: qty 1

## 2020-06-01 MED ORDER — POTASSIUM CHLORIDE CRYS ER 20 MEQ PO TBCR
20.0000 meq | EXTENDED_RELEASE_TABLET | Freq: Once | ORAL | Status: AC
  Administered 2020-06-01: 20 meq via ORAL
  Filled 2020-06-01: qty 1

## 2020-06-01 MED ORDER — FUROSEMIDE 40 MG PO TABS: 40.0000 mg | ORAL_TABLET | Freq: Two times a day (BID) | ORAL | 3 refills | Status: DC

## 2020-06-01 MED ORDER — POLYETHYLENE GLYCOL 3350 17 G PO PACK
17.0000 g | PACK | Freq: Once | ORAL | Status: AC
  Administered 2020-06-01: 17 g via ORAL
  Filled 2020-06-01: qty 1

## 2020-06-01 NOTE — Progress Notes (Signed)
NURSING PROGRESS NOTE  Katherine Lane [id number] Discharge Data: 06/01/2020 5:25 PM Attending Provider: Barton Dubois, MD [id number], Elayne Snare, MD     [name] to be D/C'd Home per MD order.  Discussed with the patient the After Visit Summary and all questions fully answered. All IV's discontinued with no bleeding noted. All belongings returned to patient for patient to take home.   Last Vital Signs:  Blood pressure (!) 103/59, pulse 66, temperature 98.2 F (36.8 C), temperature source Oral, resp. rate 16, height 5' (1.524 m), weight 50.5 kg, SpO2 95 %.  Discharge Medication List Allergies as of 06/01/2020      Reactions   Amlodipine Swelling   Leg swelling   Codeine Nausea And Vomiting, Other (See Comments)   Patient states "intolerance to all pain medications"  (NO OPIOIDS) VERY VIOLENT VOMITING!!   Other Nausea And Vomiting, Other (See Comments)   general anesthesia   Tramadol Nausea And Vomiting, Other (See Comments)   "NO OPIOIDS!!! VERY VIOLENT VOMITING!!" per Med History prior to 02/18/17      Medication List    STOP taking these medications   hydrALAZINE 25 MG tablet Commonly known as: APRESOLINE   Rivaroxaban 15 MG Tabs tablet Commonly known as: XARELTO     TAKE these medications   acetaminophen 325 MG tablet Commonly known as: TYLENOL Take 2 tablets (650 mg total) by mouth every 4 (four) hours as needed for mild pain ((score 1 to 3) or temp > 100.5).   apixaban 5 MG Tabs tablet Commonly known as: ELIQUIS Take 2 tablets by mouth twice a day for 6 days; then 1 tablet by mouth twice a day. Notes to patient: Next dose due tonight   diltiazem 120 MG 24 hr capsule Commonly known as: CARDIZEM CD TAKE (1) CAPSULE BY MOUTH ONCE DAILY. What changed:   how much to take  how to take this  when to take this  additional instructions Notes to patient: Next dose due tomorrow   fluticasone 50 MCG/ACT nasal spray Commonly known as: FLONASE Place 2 sprays into  both nostrils daily. What changed:   when to take this  reasons to take this   furosemide 40 MG tablet Commonly known as: LASIX Take 1 tablet (40 mg total) by mouth 2 (two) times daily. What changed:   how much to take  when to take this Notes to patient: Next dose due tomorrow   hydrocortisone 2.5 % rectal cream Commonly known as: ANUSOL-HC APPLY RECTALLY THREE TIMES DAILY AS DIRECTED What changed: See the new instructions.   HYDROmorphone 2 MG tablet Commonly known as: DILAUDID Take 2-3 mg by mouth every 6 (six) hours as needed for pain.   Lidocaine-Hydrocortisone Ace 3-2.5 % Kit APPLY TO RECTUM QID AS NEEDED FOR RECTAL PAIN OR BLEEDING What changed:   how much to take  how to take this  when to take this  reasons to take this  additional instructions   LORazepam 0.5 MG tablet Commonly known as: ATIVAN Take 1 tablet (0.5 mg total) by mouth 2 (two) times daily as needed for anxiety or sleep. TAKE ONE TABLET BY MOUTH UP TO TWICE DAILY AS NEEDED. What changed: additional instructions   losartan 50 MG tablet Commonly known as: COZAAR Take 0.5 tablets (25 mg total) by mouth daily. Hold What changed: how much to take Notes to patient: Next dose due tomorrow   Lubricant Eye Drops 0.4-0.3 % Soln Generic drug: Polyethyl Glycol-Propyl Glycol Place 1 drop  into both eyes at bedtime. Notes to patient: Next dose due tonight   methocarbamol 500 MG tablet Commonly known as: ROBAXIN Take 1 tablet (500 mg total) by mouth every 8 (eight) hours as needed for muscle spasms.   metolazone 2.5 MG tablet Commonly known as: ZAROXOLYN Take 1 tablet (2.5 mg total) by mouth as needed (Take for weight gain > 2 lbs overnight or > 5 lbs in one week.).   metoprolol succinate 100 MG 24 hr tablet Commonly known as: TOPROL-XL TAKE ONE TABLET BY MOUTH ONCE DAILY. Notes to patient: Next dose due tomorrow   multivitamin with minerals Tabs tablet Take 1 tablet by mouth daily. Centrum  Silver   polyethylene glycol powder 17 GM/SCOOP powder Commonly known as: GLYCOLAX/MIRALAX Take 17 g by mouth daily as needed for severe constipation. What changed: reasons to take this   potassium chloride 10 MEQ tablet Commonly known as: KLOR-CON Take 2 tablets (20 mEq total) by mouth 2 (two) times daily. Take 20 meq am and 10 mg pm What changed:   how much to take  how to take this  when to take this Notes to patient: Next dose due tonight   saccharomyces boulardii 250 MG capsule Commonly known as: FLORASTOR Take 1 capsule (250 mg total) by mouth 2 (two) times daily. Notes to patient: Next dose due tonight   shark liver oil-cocoa butter 0.25-3-85.5 % suppository Commonly known as: PREPARATION H Place 1 suppository rectally 2 (two) times daily as needed for hemorrhoids.   zolpidem 5 MG tablet Commonly known as: AMBIEN TAKE (1) TABLET BY MOUTH AT BEDTIME FOR SLEEP. What changed:   how much to take  how to take this  when to take this  reasons to take this  additional instructions        Doristine Devoid, RN

## 2020-06-01 NOTE — Discharge Summary (Signed)
Physician Discharge Summary  Katherine Lane IFO:[id number] DOB: 12-14-1932 DOA: 05/30/2020  PCP: Kathyrn Drown, MD  Admit date: 05/30/2020 Discharge date: 06/01/2020  Time spent: 35 minutes  Recommendations for Outpatient Follow-up:  1. Reassess patient's volume status continue adjusting diuretic regimen as needed 2. Reassess blood pressure and adjust antihypertensive regimen as required 3. Repeat CBC to follow hemoglobin trend 4. Repeat basic metabolic panel electrolytes and renal function.   Discharge Diagnoses:  Active Problems:   Acute on chronic diastolic HF (heart failure) (HCC)   Acute respiratory failure with hypoxia (HCC)   Diarrhea   Pulmonary embolism (HCC) Chronic atrial fibrillation Chronic back pain Recent kyphoplasty Physical deconditioning  Discharge Condition: Stable and improved.  Discharged home with home health services and instruction to follow-up with PCP and cardiology service after discharge.  CODE STATUS: Full code.  Diet recommendation: heart healthy diet.  Filed Weights   05/30/20 0940 06/01/20 0544 06/01/20 1206  Weight: 54.6 kg 52.3 kg 50.5 kg    History of present illness:  Katherine Lane a 87 y.o.femalewith medical history significant ofchronic atrial fibrillation, hypertension, chronic diastolic heart failure,chronic kidney disease a stage IIIb-IV (transition point) and chronic back pain (with recent back surgery, kyphoplasty procedure x2 during the month of March);daughter reported ongoing weakness, shortness of breath with minimal activity, weight gain of more than 5 pounds and lower extremity swelling. There has not been any specific complaints of palpitations or chest pain. She reported contacting cardiology service with recommendations for extra dose of Zaroxolyn which did not fix the problem. Patient's daughter indicates increased lethargy, weakness and intermittent diarrhea for the last 2 weeks or so. Has not been any nausea,  vomiting, abdominal pain, hematochezia, melena, dysuria, focal neurologic deficits, headaches or any other complaints.  Patient continued to have back pain, which according to patient's daughter is better than what it was before, still requiring as needed hydromorphone.  ED Course:Patient was found with elevated BNP, increased lower extremity swelling, borderline low oxygen saturation on room air88-89% and using 2-3 L nasal cannula supplementation. Troponin minimally elevated x2 in a flat fashion.Case was discussed with cardiology service who recommended tune upadmission for CHF exacerbation.  Hospital Course:  1-mild acute on chronic diastolic heart failure/with transient acute respiratory failure with hypoxia on exertion. -At time of admission after ambulating patient oxygen saturation dropped to 85-87%; improved with the use of 2 L nasal cannula supplementation. -2D echo demonstrating increased pulmonary artery pressure and right heart failure -Following cardiology recommendations CT angiogram to rule out PE was ordered, positive findings demonstrating left lower segment pulmonary embolism appreciated -Patient was chronically on xarelto; this medication was abruptly stopped for couple days while having kyphoplasty procedures last month.  Unclear if she experienced failure to medication versus lack of protection while stopping the drug. -In the setting of the doubt, she has been switched to Eliquis for better protection and less issues with dosing given CKD. -Continue to follow low-sodium diet, daily weights and adjusted diuretic dosages. -Patient reported good urine output and is currently not requiring oxygen supplementation.  2-chronic atrial fibrillation -Rate controlled -Continue Cardizem, metoprolol and now Eliquis for anticoagulation. -Continue outpatient follow-up with cardiology service.  3-chronic kidney disease stage IIIb-IV -Remains stable and overall at  baseline -Continue close monitoring with adjusted diuresis -at discharge Cr 1.43 -Will continue to minimize nephrotoxic agents as much as possible.  4-hypertension -Stable vital signs currently -Continue current antihypertensive agents -Continue heart healthy diet -Follow vital signs. -patient's hydralazine has been  discontinued and her cozaar dose cut in half.  5-history of diarrhea -Remains without nausea, vomiting or abdominal pain -Patient without diarrhea episodes since admission. -Continue symptomatic management. -discharged on florastor -no recent use of antibiotics.  6-chronic back pain -Status post kyphoplasty x2 -Continue as needed analgesia -Physical therapy has evaluated patient recommendations given for home health PT -Continue outpatient follow-up with neurosurgery.  7-insomnia -Continue the use of Ambien.   Procedures:  See below for x-ray reports  Lower extremity Dopplers: With concerns for nonobstructive right femoral DVT.  2D echo: With increased pulmonary pressure, preserved EF and signs of right heart failure.  CT angiogram demonstrating left segmental PE.  Consultations:  Cardiology service  Discharge Exam: Vitals:   06/01/20 0413 06/01/20 1357  BP: 101/74 91/63  Pulse: 75 66  Resp: 19 16  Temp: 98 F (36.7 C) 98.2 F (36.8 C)  SpO2: 100% 95%    General: Alert, awake and oriented x3 currently; no chest pain, no palpitations, no nausea, no vomiting.  Reports significant improvement in her breathing and is feeling ready to go home.  Denies orthopnea-no requiring oxygen supplementation at this time. Cardiovascular: Irregular: Rate controlled; no rubs, no gallops, no JVD on exam. Respiratory: Improved air movement bilaterally; no using accessory muscle.  No crackles appreciated on exam.  No wheezing. Abdomen: Soft, nontender, distended, positive bowel sounds Extremities: Positive trace-1+ edema bilaterally.  No cyanosis or  clubbing.  Discharge Instructions   Discharge Instructions    (HEART FAILURE PATIENTS) Call MD:  Anytime you have any of the following symptoms: 1) 3 pound weight gain in 24 hours or 5 pounds in 1 week 2) shortness of breath, with or without a dry hacking cough 3) swelling in the hands, feet or stomach 4) if you have to sleep on extra pillows at night in order to breathe.   Complete by: As directed    Diet - low sodium heart healthy   Complete by: As directed    Discharge instructions   Complete by: As directed    Outpatient follow-up with cardiology service Follow-up with PCP in 10 days Maintain adequate hydration  Continue to follow heart healthy/low-sodium diet Continue to check your weight on daily basis.     Allergies as of 06/01/2020      Reactions   Amlodipine Swelling   Leg swelling   Codeine Nausea And Vomiting, Other (See Comments)   Patient states "intolerance to all pain medications"  (NO OPIOIDS) VERY VIOLENT VOMITING!!   Other Nausea And Vomiting, Other (See Comments)   general anesthesia   Tramadol Nausea And Vomiting, Other (See Comments)   "NO OPIOIDS!!! VERY VIOLENT VOMITING!!" per Med History prior to 02/18/17      Medication List    STOP taking these medications   hydrALAZINE 25 MG tablet Commonly known as: APRESOLINE   Rivaroxaban 15 MG Tabs tablet Commonly known as: XARELTO     TAKE these medications   acetaminophen 325 MG tablet Commonly known as: TYLENOL Take 2 tablets (650 mg total) by mouth every 4 (four) hours as needed for mild pain ((score 1 to 3) or temp > 100.5).   apixaban 5 MG Tabs tablet Commonly known as: ELIQUIS Take 2 tablets by mouth twice a day for 6 days; then 1 tablet by mouth twice a day.   diltiazem 120 MG 24 hr capsule Commonly known as: CARDIZEM CD TAKE (1) CAPSULE BY MOUTH ONCE DAILY. What changed:   how much to take  how to take  this  when to take this  additional instructions   fluticasone 50 MCG/ACT nasal  spray Commonly known as: FLONASE Place 2 sprays into both nostrils daily. What changed:   when to take this  reasons to take this   furosemide 40 MG tablet Commonly known as: LASIX Take 1 tablet (40 mg total) by mouth 2 (two) times daily. What changed:   how much to take  when to take this   hydrocortisone 2.5 % rectal cream Commonly known as: ANUSOL-HC APPLY RECTALLY THREE TIMES DAILY AS DIRECTED What changed: See the new instructions.   HYDROmorphone 2 MG tablet Commonly known as: DILAUDID Take 2-3 mg by mouth every 6 (six) hours as needed for pain.   Lidocaine-Hydrocortisone Ace 3-2.5 % Kit APPLY TO RECTUM QID AS NEEDED FOR RECTAL PAIN OR BLEEDING What changed:   how much to take  how to take this  when to take this  reasons to take this  additional instructions   LORazepam 0.5 MG tablet Commonly known as: ATIVAN Take 1 tablet (0.5 mg total) by mouth 2 (two) times daily as needed for anxiety or sleep. TAKE ONE TABLET BY MOUTH UP TO TWICE DAILY AS NEEDED. What changed: additional instructions   losartan 50 MG tablet Commonly known as: COZAAR Take 0.5 tablets (25 mg total) by mouth daily. Hold What changed: how much to take   Lubricant Eye Drops 0.4-0.3 % Soln Generic drug: Polyethyl Glycol-Propyl Glycol Place 1 drop into both eyes at bedtime.   methocarbamol 500 MG tablet Commonly known as: ROBAXIN Take 1 tablet (500 mg total) by mouth every 8 (eight) hours as needed for muscle spasms.   metolazone 2.5 MG tablet Commonly known as: ZAROXOLYN Take 1 tablet (2.5 mg total) by mouth as needed (Take for weight gain > 2 lbs overnight or > 5 lbs in one week.).   metoprolol succinate 100 MG 24 hr tablet Commonly known as: TOPROL-XL TAKE ONE TABLET BY MOUTH ONCE DAILY.   multivitamin with minerals Tabs tablet Take 1 tablet by mouth daily. Centrum Silver   polyethylene glycol powder 17 GM/SCOOP powder Commonly known as: GLYCOLAX/MIRALAX Take 17 g by  mouth daily as needed for severe constipation. What changed: reasons to take this   potassium chloride 10 MEQ tablet Commonly known as: KLOR-CON Take 2 tablets (20 mEq total) by mouth 2 (two) times daily. Take 20 meq am and 10 mg pm What changed:   how much to take  how to take this  when to take this   saccharomyces boulardii 250 MG capsule Commonly known as: FLORASTOR Take 1 capsule (250 mg total) by mouth 2 (two) times daily.   shark liver oil-cocoa butter 0.25-3-85.5 % suppository Commonly known as: PREPARATION H Place 1 suppository rectally 2 (two) times daily as needed for hemorrhoids.   zolpidem 5 MG tablet Commonly known as: AMBIEN TAKE (1) TABLET BY MOUTH AT BEDTIME FOR SLEEP. What changed:   how much to take  how to take this  when to take this  reasons to take this  additional instructions      Allergies  Allergen Reactions  . Amlodipine Swelling    Leg swelling   . Codeine Nausea And Vomiting and Other (See Comments)    Patient states "intolerance to all pain medications"  (NO OPIOIDS) VERY VIOLENT VOMITING!!  . Other Nausea And Vomiting and Other (See Comments)    general anesthesia  . Tramadol Nausea And Vomiting and Other (See Comments)    "  NO OPIOIDS!!! VERY VIOLENT VOMITING!!" per Med History prior to 02/18/17    Follow-up Information    Erma Heritage, PA-C Follow up on 06/19/2020.   Specialties: Physician Assistant, Cardiology Why: Cardiology Hospital Follow-up on 06/19/2020 at 3:30 PM.  Contact information: [place] [address] [phone]        Kathyrn Drown, MD. Schedule an appointment as soon as possible for a visit in 10 day(s).   Specialty: Family Medicine Contact information: [place] [address] [phone]        Satira Sark, MD .   Specialty: Cardiology Contact information: [place]  [address] [phone]               The results  of significant diagnostics from this hospitalization (including imaging, microbiology, ancillary and laboratory) are listed below for reference.    Significant Diagnostic Studies: DG Thoracolumabar Spine  Result Date: 05/15/2020 CLINICAL DATA:  Elective surgery. Additional history provided: Thoracic 9, thoracic 10 kyphoplasty. Provided fluoroscopy time 1:40 2.5 seconds (25.07 mGy). EXAM: THORACOLUMBAR SPINE 1V COMPARISON:  MRI of the thoracic spine 05/09/2020. FINDINGS: AP and lateral view intraprocedural fluoroscopic images of the thoracic spine are submitted, 2 images total. The images demonstrate kyphoplasty material now present within the T9 and T10 vertebrae at site of known acute compression fractures. Redemonstrated sequela of prior augmentation of T11, T12 and L1 vertebral compression fractures. On the provided lateral view fluoroscopic image, there is extension of kyphoplasty material into the ventral aspect of the spinal canal at the T9-T10 levels. This was noted in the procedure note in the EMR. IMPRESSION: Two intraprocedural fluoroscopic images of the thoracic spine from T9 and T10 kyphoplasty, as described. Kyphoplasty material is present within the ventral aspect of the spinal canal at T9-T10. This finding was noted in the procedural note in the EMR. Electronically Signed   By: Kellie Simmering DO   On: 05/15/2020 11:22   CT ANGIO CHEST PE W OR WO CONTRAST  Result Date: 05/31/2020 CLINICAL DATA:  PE suspected EXAM: CT ANGIOGRAPHY CHEST WITH CONTRAST TECHNIQUE: Multidetector CT imaging of the chest was performed using the standard protocol during bolus administration of intravenous contrast. Multiplanar CT image reconstructions and MIPs were obtained to evaluate the vascular anatomy. CONTRAST:  35m OMNIPAQUE IOHEXOL 350 MG/ML SOLN COMPARISON:  None. FINDINGS: Cardiovascular: Satisfactory opacification of the pulmonary arteries to the segmental level. Positive examination for pulmonary embolism,  with segmental to subsegmental embolus identified in the posterior segment left lower lobe (series 4, image 54). No other embolus noted. Cardiomegaly. Enlargement of the main pulmonary artery measuring up to 4.1 cm in caliber. Aortic atherosclerosis. No pericardial effusion. Mediastinum/Nodes: Prominent, calcified mediastinal lymph nodes, in keeping with prior granulomatous infection. Thyroid gland, trachea, and esophagus demonstrate no significant findings. Lungs/Pleura: Small right pleural effusion with associated atelectasis or consolidation. Benign, calcified nodules of the lateral segment right middle lobe (series 6, image 79). Upper Abdomen: No acute abnormality. Benign adenomatous thickening of the bilateral adrenal glands. Musculoskeletal: No chest wall abnormality. No acute or significant osseous findings. Review of the MIP images confirms the above findings. IMPRESSION: 1. Positive examination for pulmonary embolism, with segmental to subsegmental embolus identified in the posterior segment left lower lobe. No other embolus noted. 2. Small right pleural effusion with associated atelectasis or consolidation. 3. Cardiomegaly. 4. Enlargement of the main pulmonary artery measuring up to 4.1 cm in caliber, as can be seen in  pulmonary hypertension. Call report request was placed at the time of dictation. Final communication will be documented. Aortic Atherosclerosis (ICD10-I70.0). Electronically Signed   By: Eddie Candle M.D.   On: 05/31/2020 16:59   MR THORACIC SPINE WO CONTRAST  Result Date: 05/09/2020 CLINICAL DATA:  Severe mid back pain. History of recent T11 and T12 kyphoplasty. EXAM: MRI THORACIC SPINE WITHOUT CONTRAST TECHNIQUE: Multiplanar, multisequence MR imaging of the thoracic spine was performed. No intravenous contrast was administered. COMPARISON:  Thoracic spine x-rays from yesterday. MRI thoracic spine dated April 14, 2020. FINDINGS: Alignment: Unchanged focal kyphosis at T3 and mild  straightening of the mid and lower thoracic spine. No significant listhesis. Vertebrae: New acute compression fracture of the T9 inferior endplate, asymmetric to the right, with minimal height loss and no retropulsion. New acute compression fracture of the T10 vertebral body with 45% height loss and no retropulsion. Interval T11 and T12 compression fracture cement augmentation with residual marrow edema. Chronic severe T3 compression deformity. Chronic L1, L2, and L3 compression deformity status post cement augmentation. Cord:  Normal signal and morphology. Paraspinal and other soft tissues: Bilateral paravertebral edema from T9-T12. Small right pleural effusion. Cardiomegaly. 4.3 cm ascending thoracic aortic aneurysm, grossly unchanged since 2015. Disc levels: Minimal disc bulging from T10-T11 through T12-L1. No significant spinal canal or neuroforaminal stenosis at any level. IMPRESSION: 1. New acute compression fractures of the T9 and T10 vertebral bodies as described above. 2. Interval T11 and T12 compression fracture cement augmentation with residual marrow edema. 3. Small right pleural effusion. 4. 4.3 cm ascending thoracic aortic aneurysm, grossly unchanged since 2015. Recommend annual imaging followup by CTA or MRA. This recommendation follows 2010 ACCF/AHA/AATS/ACR/ASA/SCA/SCAI/SIR/STS/SVM Guidelines for the Diagnosis and Management of Patients with Thoracic Aortic Disease. Circulation. 2010; 121: Q657-Q469. Aortic aneurysm NOS (ICD10-I71.9) Electronically Signed   By: Titus Dubin M.D.   On: 05/09/2020 09:14   US Venous Img Lower Bilateral (DVT)  Result Date: 06/01/2020 CLINICAL DATA:  Shortness of breath with pulmonary embolism. EXAM: RIGHT LOWER EXTREMITY VENOUS DOPPLER ULTRASOUND TECHNIQUE: Gray-scale sonography with compression, as well as color and duplex ultrasound, were performed to evaluate the deep venous system(s) from the level of the common femoral vein through the popliteal and proximal  calf veins. COMPARISON:  None. FINDINGS: VENOUS Eccentric material along the distal right femoral wall, suspicious for nonocclusive thrombus. Otherwise, normal compressibility of the common femoral, superficial femoral, and popliteal veins, as well as the visualized calf veins. Visualized portions of profunda femoral vein and great saphenous vein unremarkable. No other filling defects to suggest DVT on grayscale or color Doppler imaging. Doppler waveforms show normal direction of venous flow, normal respiratory plasticity and response to augmentation. Limited views of the contralateral common femoral vein are unremarkable. IMPRESSION: Eccentric material along the distal right femoral wall, suspicious for nonocclusive thrombus. Electronically Signed   By: Margaretha Sheffield MD   On: 06/01/2020 12:08   DG Chest Portable 1 View  Result Date: 05/30/2020 CLINICAL DATA:  Weakness. EXAM: PORTABLE CHEST 1 VIEW COMPARISON:  12/30/2019 and older studies. FINDINGS: Mild stable enlargement of the cardiopericardial silhouette. No mediastinal or hilar masses. Prominent interstitial markings. Calcification noted in the right lower hemithorax. These findings are stable. No evidence of pneumonia or pulmonary edema. No convincing pleural effusion and no pneumothorax. Skeletal structures are demineralized. There are multiple old vertebral fractures with 7 visualized treated with previous vertebroplasty. IMPRESSION: 1. No acute cardiopulmonary disease. 2. Chronic interstitial prominence in the lungs. Stable mild cardiomegaly. Electronically  Signed   By: Lajean Manes M.D.   On: 05/30/2020 10:12   DG C-Arm 1-60 Min  Result Date: 05/15/2020 CLINICAL DATA:  Elective surgery. Additional history provided: Thoracic 9, thoracic 10 kyphoplasty. Provided fluoroscopy time 1:40 2.5 seconds (25.07 mGy). EXAM: THORACOLUMBAR SPINE 1V COMPARISON:  MRI of the thoracic spine 05/09/2020. FINDINGS: AP and lateral view intraprocedural fluoroscopic  images of the thoracic spine are submitted, 2 images total. The images demonstrate kyphoplasty material now present within the T9 and T10 vertebrae at site of known acute compression fractures. Redemonstrated sequela of prior augmentation of T11, T12 and L1 vertebral compression fractures. On the provided lateral view fluoroscopic image, there is extension of kyphoplasty material into the ventral aspect of the spinal canal at the T9-T10 levels. This was noted in the procedure note in the EMR. IMPRESSION: Two intraprocedural fluoroscopic images of the thoracic spine from T9 and T10 kyphoplasty, as described. Kyphoplasty material is present within the ventral aspect of the spinal canal at T9-T10. This finding was noted in the procedural note in the EMR. Electronically Signed   By: Kellie Simmering DO   On: 05/15/2020 11:22   ECHOCARDIOGRAM COMPLETE  Result Date: 05/30/2020    ECHOCARDIOGRAM REPORT   Patient Name:   JAZMYNN PHO Date of Exam: 05/30/2020 Medical Rec #:  [id number]      Height:       60.0 in Accession #:    [id number]     Weight:       120.4 lb Date of Birth:  09-13-1932     BSA:          1.504 m Patient Age:    87 years       BP:           124/91 mmHg Patient Gender: F              HR:           91 bpm. Exam Location:  [facility] Procedure: 2D Echo Indications:    CHF-Acute Diastolic H74.14  History:        Patient has prior history of Echocardiogram examinations, most                 recent 09/28/2019. CHF, Arrythmias:Atrial Flutter and Atrial                 Fibrillation; Risk Factors:Hypertension and Non-Smoker.  Sonographer:    Leavy Cella RDCS (AE) Referring Phys: [place]  1. Left ventricular ejection fraction, by estimation, is 60 to 65%. The left ventricle has normal function. The left ventricle has no regional wall motion abnormalities. There is mild left ventricular hypertrophy. Left ventricular diastolic parameters are indeterminate.  2. Right ventricular systolic  function is moderately reduced. The right ventricular size is moderately enlarged.  3. Left atrial size was moderately dilated.  4. Right atrial size was severely dilated.  5. The mitral valve is degenerative. Trivial mitral valve regurgitation. No evidence of mitral stenosis. Moderate mitral annular calcification.  6. Estimated PA pressure elevated and higher than estimate on echo done 09/28/19 . Tricuspid valve regurgitation is moderate.  7. The aortic valve is calcified. There is moderate calcification of the aortic valve. Aortic valve regurgitation is mild. Mild to moderate aortic valve sclerosis/calcification is present, without any evidence of aortic stenosis.  8. The inferior vena cava is dilated in size with >50% respiratory variability, suggesting right atrial pressure of 8 mmHg. FINDINGS  Left Ventricle: Left  ventricular ejection fraction, by estimation, is 60 to 65%. The left ventricle has normal function. The left ventricle has no regional wall motion abnormalities. The left ventricular internal cavity size was normal in size. There is  mild left ventricular hypertrophy. Left ventricular diastolic parameters are indeterminate. Right Ventricle: The right ventricular size is moderately enlarged. Right vetricular wall thickness was not assessed. Right ventricular systolic function is moderately reduced. Left Atrium: Left atrial size was moderately dilated. Right Atrium: Right atrial size was severely dilated. Pericardium: There is no evidence of pericardial effusion. Mitral Valve: The mitral valve is degenerative in appearance. There is moderate thickening of the mitral valve leaflet(s). There is moderate calcification of the mitral valve leaflet(s). Moderate mitral annular calcification. Trivial mitral valve regurgitation. No evidence of mitral valve stenosis. Tricuspid Valve: Estimated PA pressure elevated and higher than estimate on echo done 09/28/19. The tricuspid valve is normal in structure. Tricuspid  valve regurgitation is moderate . No evidence of tricuspid stenosis. Aortic Valve: The aortic valve is calcified. There is moderate calcification of the aortic valve. Aortic valve regurgitation is mild. Mild to moderate aortic valve sclerosis/calcification is present, without any evidence of aortic stenosis. Pulmonic Valve: The pulmonic valve was normal in structure. Pulmonic valve regurgitation is not visualized. No evidence of pulmonic stenosis. Aorta: The aortic root is normal in size and structure. Venous: The inferior vena cava is dilated in size with greater than 50% respiratory variability, suggesting right atrial pressure of 8 mmHg. IAS/Shunts: No atrial level shunt detected by color flow Doppler.  LEFT VENTRICLE PLAX 2D LVIDd:         2.10 cm  Diastology LVIDs:         1.68 cm  LV e' medial:    5.18 cm/s LV PW:         1.68 cm  LV E/e' medial:  16.5 LV IVS:        1.26 cm  LV e' lateral:   5.85 cm/s LVOT diam:     1.80 cm  LV E/e' lateral: 14.6 LVOT Area:     2.54 cm  RIGHT VENTRICLE RV S prime:     8.03 cm/s TAPSE (M-mode): 1.6 cm LEFT ATRIUM             Index       RIGHT ATRIUM           Index LA diam:        3.80 cm 2.53 cm/m  RA Area:     17.30 cm LA Vol (A2C):   51.2 ml 34.03 ml/m RA Volume:   47.20 ml  31.37 ml/m LA Vol (A4C):   35.8 ml 23.80 ml/m LA Biplane Vol: 44.1 ml 29.31 ml/m   AORTA Ao Root diam: 2.90 cm MITRAL VALVE               TRICUSPID VALVE MV Area (PHT): 4.44 cm    TR Peak grad:   68.9 mmHg MV Decel Time: 171 msec    TR Vmax:        415.00 cm/s MV E velocity: 85.30 cm/s MV A velocity: 30.00 cm/s  SHUNTS MV E/A ratio:  2.84        Systemic Diam: 1.80 cm Jenkins Rouge MD Electronically signed by Jenkins Rouge MD Signature Date/Time: 05/30/2020/4:47:03 PM    Final     Microbiology: Recent Results (from the past 240 hour(s))  Resp Panel by RT-PCR (Flu A&B, Covid) Nasopharyngeal Swab     Status: None  Collection Time: 05/30/20  9:45 AM   Specimen: Nasopharyngeal Swab;  Nasopharyngeal(NP) swabs in vial transport medium  Result Value Ref Range Status   SARS Coronavirus 2 by RT PCR NEGATIVE NEGATIVE Final    Comment: (NOTE) SARS-CoV-2 target nucleic acids are NOT DETECTED.  The SARS-CoV-2 RNA is generally detectable in upper respiratory specimens during the acute phase of infection. The lowest concentration of SARS-CoV-2 viral copies this assay can detect is 138 copies/mL. A negative result does not preclude SARS-Cov-2 infection and should not be used as the sole basis for treatment or other patient management decisions. A negative result may occur with  improper specimen collection/handling, submission of specimen other than nasopharyngeal swab, presence of viral mutation(s) within the areas targeted by this assay, and inadequate number of viral copies(<138 copies/mL). A negative result must be combined with clinical observations, patient history, and epidemiological information. The expected result is Negative.  Fact Sheet for Patients:  EntrepreneurPulse.com.au  Fact Sheet for Healthcare Providers:  IncredibleEmployment.be  This test is no t yet approved or cleared by the [place] FDA and  has been authorized for detection and/or diagnosis of SARS-CoV-2 by FDA under an Emergency Use Authorization (EUA). This EUA will remain  in effect (meaning this test can be used) for the duration of the COVID-19 declaration under Section 564(b)(1) of the Act, 21 U.S.C.section 360bbb-3(b)(1), unless the authorization is terminated  or revoked sooner.       Influenza A by PCR NEGATIVE NEGATIVE Final   Influenza B by PCR NEGATIVE NEGATIVE Final    Comment: (NOTE) The Xpert Xpress SARS-CoV-2/FLU/RSV plus assay is intended as an aid in the diagnosis of influenza from Nasopharyngeal swab specimens and should not be used as a sole basis for treatment. Nasal washings and aspirates are unacceptable for Xpert Xpress  SARS-CoV-2/FLU/RSV testing.  Fact Sheet for Patients: EntrepreneurPulse.com.au  Fact Sheet for Healthcare Providers: IncredibleEmployment.be  This test is not yet approved or cleared by the [place] FDA and has been authorized for detection and/or diagnosis of SARS-CoV-2 by FDA under an Emergency Use Authorization (EUA). This EUA will remain in effect (meaning this test can be used) for the duration of the COVID-19 declaration under Section 564(b)(1) of the Act, 21 U.S.C. section 360bbb-3(b)(1), unless the authorization is terminated or revoked.  Performed at [facility], [address] [place], [place] [address]      Labs: Basic Metabolic Panel: Recent Labs  Lab 05/30/20 0941 05/30/20 1137 05/31/20 0410 06/01/20 0413  NA 138  --  138 138  K 3.7  --  3.5 3.2*  CL 102  --  99 95*  CO2 21*  --  26 30  GLUCOSE 96  --  81 83  BUN 42*  --  43* 44*  CREATININE 1.69*  --  1.63* 1.47*  CALCIUM 8.9  --  8.9 9.0  MG  --  2.3  --  2.2   Liver Function Tests: Recent Labs  Lab 05/30/20 0941  AST 33  ALT 20  ALKPHOS 139*  BILITOT 1.6*  PROT 6.3*  ALBUMIN 3.5   Recent Labs  Lab 05/30/20 0941  LIPASE 54*   CBC: Recent Labs  Lab 05/30/20 0941  WBC 7.4  NEUTROABS 4.7  HGB 11.5*  HCT 39.3  MCV 84.7  PLT 238   BNP (last 3 results) Recent Labs    09/27/19 1535 02/23/20 1155 05/30/20 0946  BNP 1,418.0* 996.0* 2,187.0*    Signed:  Barton Dubois MD.  [facility] 06/01/2020, 3:36 PM

## 2020-06-01 NOTE — Progress Notes (Signed)
Tele called to make me aware that patient ran 4 Beat V-tach. Did vitals on patient, patient is stable. Let MD know about the 4 beat V-tach and patients vitals. MD ordered labs.

## 2020-06-01 NOTE — Progress Notes (Signed)
Received [facility] consult that patient requested prayer. Arrived and found Katherine Lane sitting up in recliner watching the Price is Right. She greeted Chaplain with a smile on her face and welcomed her warmly bedside. She engaged well in life review, reflecting on her life with husband who passed 8 years ago and her relationships with her 3 daughters. She shared openly about how the last few years have been difficult physically and how since she had Covid 2 years ago, her body hasn't seemed the same. She welcomed support, encouragement, and affirmation of faith from [facility] today and Chaplain provided prayer to end the visit. Will remain available in order to provide spiritual support and to assess for spiritual need.

## 2020-06-01 NOTE — Discharge Instructions (Signed)
Information on my medicine - ELIQUIS (apixaban)  This medication education was reviewed with me or my healthcare representative as part of my discharge preparation.    Why was Eliquis prescribed for you? Eliquis was prescribed to treat blood clots that may have been found in the veins of your legs (deep vein thrombosis) or in your lungs (pulmonary embolism) and to reduce the risk of them occurring again.  What do You need to know about Eliquis ? The starting dose is 10 mg (two 5 mg tablets) taken TWICE daily for the FIRST SEVEN (7) DAYS, then on 06/07/2020 PM dose the dose is reduced to ONE 5 mg tablet taken TWICE daily.  Eliquis may be taken with or without food.   Try to take the dose about the same time in the morning and in the evening. If you have difficulty swallowing the tablet whole please discuss with your pharmacist how to take the medication safely.  Take Eliquis exactly as prescribed and DO NOT stop taking Eliquis without talking to the doctor who prescribed the medication.  Stopping may increase your risk of developing a new blood clot.  Refill your prescription before you run out.  After discharge, you should have regular check-up appointments with your healthcare provider that is prescribing your Eliquis.    What do you do if you miss a dose? If a dose of ELIQUIS is not taken at the scheduled time, take it as soon as possible on the same day and twice-daily administration should be resumed. The dose should not be doubled to make up for a missed dose.  Important Safety Information A possible side effect of Eliquis is bleeding. You should call your healthcare provider right away if you experience any of the following: ? Bleeding from an injury or your nose that does not stop. ? Unusual colored urine (red or dark brown) or unusual colored stools (red or black). ? Unusual bruising for unknown reasons. ? A serious fall or if you hit your head (even if there is no  bleeding).  Some medicines may interact with Eliquis and might increase your risk of bleeding or clotting while on Eliquis. To help avoid this, consult your healthcare provider or pharmacist prior to using any new prescription or non-prescription medications, including herbals, vitamins, non-steroidal anti-inflammatory drugs (NSAIDs) and supplements.  This website has more information on Eliquis (apixaban): http://www.eliquis.com/eliquis/home

## 2020-06-01 NOTE — TOC Benefit Eligibility Note (Signed)
Patient Advocate Encounter   Insurance verification completed.     The patient is currently admitted and upon discharge could be taking Eliquis 5 mg.   The current 30 day co-pay is, $40.00.    The patient is insured through [employer] Part D        Letitia Sabala, CPhT Pharmacy Patient Advocate Specialist [facility] Direct Number: [phone]  Fax: [phone]   

## 2020-06-01 NOTE — Progress Notes (Addendum)
Progress Note  Patient Name: Katherine Lane Date of Encounter: 06/01/2020  Primary Cardiologist: Nona Dell, MD   Subjective   Breathing improved. No chest pain or palpitations. Reports she now feels constipated and cannot have a BM.   Inpatient Medications    Scheduled Meds: . apixaban  10 mg Oral BID   Followed by  . [START ON 06/07/2020] apixaban  5 mg Oral BID  . diltiazem  120 mg Oral Daily  . furosemide  40 mg Oral BID  . losartan  25 mg Oral Daily  . metoprolol succinate  100 mg Oral Daily  . polyvinyl alcohol  1 drop Both Eyes QHS  . potassium chloride  20 mEq Oral Daily  . potassium chloride  20 mEq Oral Once  . saccharomyces boulardii  250 mg Oral BID  . sodium chloride flush  3 mL Intravenous Q12H   Continuous Infusions: . sodium chloride     PRN Meds: sodium chloride, acetaminophen, fluticasone, HYDROmorphone, iohexol, methocarbamol, ondansetron (ZOFRAN) IV, sodium chloride flush, zolpidem   Vital Signs    Vitals:   05/31/20 1937 06/01/20 0329 06/01/20 0413 06/01/20 0544  BP: 97/77 118/87 101/74   Pulse: 71 66 75   Resp: 19 20 19    Temp: 97.7 F (36.5 C) (!) 97.4 F (36.3 C) 98 F (36.7 C)   TempSrc:  Oral    SpO2: 92%  100%   Weight:    52.3 kg  Height:        Intake/Output Summary (Last 24 hours) at 06/01/2020 0921 Last data filed at 06/01/2020 0808 Gross per 24 hour  Intake 840 ml  Output 1600 ml  Net -760 ml    Last 3 Weights 06/01/2020 05/30/2020 05/15/2020  Weight (lbs) 115 lb 4.8 oz 120 lb 6.4 oz 118 lb  Weight (kg) 52.3 kg 54.613 kg 53.524 kg      Telemetry    Atrial fibrillation, HR in 80's to 90's. 4 beats NSVT - Personally Reviewed  ECG    No new tracings.   Physical Exam   General: Well developed, elderly female appearing in no acute distress. Head: Normocephalic, atraumatic.  Neck: Supple without bruits, JVD not elevated. Lungs:  Resp regular and unlabored, slightly decreased breath sounds along bases. Heart:  Irregularly irregular, 2/6 SEM along LUSB.  Abdomen: Soft, non-tender, non-distended.  Extremities: No clubbing or cyanosis, trace to 1+ lower extremity edema. Distal pedal pulses are 2+ bilaterally. Neuro: Alert and oriented X 3. Moves all extremities spontaneously. Psych: Normal affect.  Labs    Chemistry Recent Labs  Lab 05/30/20 0941 05/31/20 0410 06/01/20 0413  NA 138 138 138  K 3.7 3.5 3.2*  CL 102 99 95*  CO2 21* 26 30  GLUCOSE 96 81 83  BUN 42* 43* 44*  CREATININE 1.69* 1.63* 1.47*  CALCIUM 8.9 8.9 9.0  PROT 6.3*  --   --   ALBUMIN 3.5  --   --   AST 33  --   --   ALT 20  --   --   ALKPHOS 139*  --   --   BILITOT 1.6*  --   --   GFRNONAA 29* 31* 35*  ANIONGAP 15 13 13      Hematology Recent Labs  Lab 05/30/20 0941  WBC 7.4  RBC 4.64  HGB 11.5*  HCT 39.3  MCV 84.7  MCH 24.8*  MCHC 29.3*  RDW 24.8*  PLT 238    Cardiac EnzymesNo results for input(s): TROPONINI  in the last 168 hours. No results for input(s): TROPIPOC in the last 168 hours.   BNP Recent Labs  Lab 05/30/20 0946  BNP 2,187.0*     DDimer No results for input(s): DDIMER in the last 168 hours.   Radiology    CT ANGIO CHEST PE W OR WO CONTRAST  Result Date: 05/31/2020 CLINICAL DATA:  PE suspected EXAM: CT ANGIOGRAPHY CHEST WITH CONTRAST TECHNIQUE: Multidetector CT imaging of the chest was performed using the standard protocol during bolus administration of intravenous contrast. Multiplanar CT image reconstructions and MIPs were obtained to evaluate the vascular anatomy. CONTRAST:  76mL OMNIPAQUE IOHEXOL 350 MG/ML SOLN COMPARISON:  None. FINDINGS: Cardiovascular: Satisfactory opacification of the pulmonary arteries to the segmental level. Positive examination for pulmonary embolism, with segmental to subsegmental embolus identified in the posterior segment left lower lobe (series 4, image 54). No other embolus noted. Cardiomegaly. Enlargement of the main pulmonary artery measuring up to 4.1 cm  in caliber. Aortic atherosclerosis. No pericardial effusion. Mediastinum/Nodes: Prominent, calcified mediastinal lymph nodes, in keeping with prior granulomatous infection. Thyroid gland, trachea, and esophagus demonstrate no significant findings. Lungs/Pleura: Small right pleural effusion with associated atelectasis or consolidation. Benign, calcified nodules of the lateral segment right middle lobe (series 6, image 79). Upper Abdomen: No acute abnormality. Benign adenomatous thickening of the bilateral adrenal glands. Musculoskeletal: No chest wall abnormality. No acute or significant osseous findings. Review of the MIP images confirms the above findings. IMPRESSION: 1. Positive examination for pulmonary embolism, with segmental to subsegmental embolus identified in the posterior segment left lower lobe. No other embolus noted. 2. Small right pleural effusion with associated atelectasis or consolidation. 3. Cardiomegaly. 4. Enlargement of the main pulmonary artery measuring up to 4.1 cm in caliber, as can be seen in pulmonary hypertension. Call report request was placed at the time of dictation. Final communication will be documented. Aortic Atherosclerosis (ICD10-I70.0). Electronically Signed   By: Lauralyn Primes M.D.   On: 05/31/2020 16:59   DG Chest Portable 1 View  Result Date: 05/30/2020 CLINICAL DATA:  Weakness. EXAM: PORTABLE CHEST 1 VIEW COMPARISON:  12/30/2019 and older studies. FINDINGS: Mild stable enlargement of the cardiopericardial silhouette. No mediastinal or hilar masses. Prominent interstitial markings. Calcification noted in the right lower hemithorax. These findings are stable. No evidence of pneumonia or pulmonary edema. No convincing pleural effusion and no pneumothorax. Skeletal structures are demineralized. There are multiple old vertebral fractures with 7 visualized treated with previous vertebroplasty. IMPRESSION: 1. No acute cardiopulmonary disease. 2. Chronic interstitial prominence  in the lungs. Stable mild cardiomegaly. Electronically Signed   By: Amie Portland M.D.   On: 05/30/2020 10:12    Cardiac Studies   Echocardiogram: 05/2020 IMPRESSIONS    1. Left ventricular ejection fraction, by estimation, is 60 to 65%. The  left ventricle has normal function. The left ventricle has no regional  wall motion abnormalities. There is mild left ventricular hypertrophy.  Left ventricular diastolic parameters  are indeterminate.  2. Right ventricular systolic function is moderately reduced. The right  ventricular size is moderately enlarged.  3. Left atrial size was moderately dilated.  4. Right atrial size was severely dilated.  5. The mitral valve is degenerative. Trivial mitral valve regurgitation.  No evidence of mitral stenosis. Moderate mitral annular calcification.  6. Estimated PA pressure elevated and higher than estimate on echo done  09/28/19 . Tricuspid valve regurgitation is moderate.  7. The aortic valve is calcified. There is moderate calcification of the  aortic valve.  Aortic valve regurgitation is mild. Mild to moderate aortic  valve sclerosis/calcification is present, without any evidence of aortic  stenosis.  8. The inferior vena cava is dilated in size with >50% respiratory  variability, suggesting right atrial pressure of 8 mmHg.   Patient Profile     87 y.o. female with past medical history of chronic diastolic CHF, permanent atrial fibrillation, HTN, and chronic back pain who is currently admitted for an acute CHF exacerbation.   Assessment & Plan    1. Acute on Chronic Diastolic CHF Exacerbation LV/ systolic dysfunction RV   - BNP was elevated to 2187 on admission and CXR showing stable interstitial prominence. Repeat echocardiogram showed a preserved EF of 60-65% with moderately reduced RV function and PASP was elevated as well. CTA performed and positive for a PE.  - She has been receiving intermittent doses of IV Lasix and was  switched to PO Lasix 40mg  BID today (was on 60mg  daily prior to admission). Recorded net output of -970 mL and weights not recorded daily so difficult to assess response but at 115 lbs this AM and was at 118 lbs during her office visit in 03/2020.  2. Pulmonary Embolism - She was on Xarelto prior to admission but this was held prior to her kyphoplasty on 04/20/2020 (cleared to hold for 4 days by pharmacy) and she had a recurrent kyphoplasty on 05/15/2020 and held Xarelto for a week. She has been started on Eliquis for anticoagulation at PE dosing. Lower extremity dopplers pending. Will likely require bridging in the future if she has recurrent surgeries.   3. Permanent Atrial Fibrillation - Rates have been in the 80's to 90's. Remains on Cardizem CD 120mg  daily and Toprol-XL 100mg  daily.  - She has been switched from Xarelto to Eliquis as outlined above and is currently receiving 10mg  BID for PE dosing.   4. Stage 3 CKD - Baseline creatinine 1.1 - 1.2. elevated to 1.69 on admission, improving to 1.47 today with diuresis.   5. NSVT - 4 beats NSVT on telemetry this AM. K+ at 3.2 and Mg 2.2. K+ supplementation already ordered. Will try to keep K+ ~ 4.0 and Mg ~ 2.0.  For questions or updates, please contact [facility] Please consult [email or web address] for contact info under Cardiology/STEMI.   Signed, 06/20/2020 , PA-C 9:21 AM 06/01/2020 Pager: [phone]  Pt seen and examined  I have reviewed/amended note above  Pt breathing more comfortably today  Denies CP  Have LE dopplers now. Neck  JVP is mildly increased   Lungs are relatively clear Cardiac exam:  Irreg irreg  No S3 Ext with 1+ edema in legs   Note CT showed segmental PE.   (Pt had been on /off Eliquis over the recent past) WIll need long term anticoagulation with hix with minimal interruption/consider bridging if needs to be off for long periods (will have pharmacy follow over time) I think volume is still up some    She  got 60 mg PO lasix this AM   I would recomm one more dose of IV lasix  (60 mg) later this afternoon.and then keep on PO  WIll follow up as outpt   CLose to d/c    MD

## 2020-06-01 NOTE — TOC Transition Note (Signed)
Transition of Care [facility]) - CM/SW Discharge Note   Patient Details  Name: Katherine Lane MRN: [id number] Date of Birth: 1932/04/12  Transition of Care [facility]) CM/SW Contact:  Annice Needy, LCSW Phone Number: 06/01/2020, 3:53 PM   Clinical Narrative:    Patient is a readmit. From home. Active with [facility]. Resumption orders placed.          Patient Goals and CMS Choice        Discharge Placement                       Discharge Plan and Services                                     Social Determinants of Health (SDOH) Interventions     Readmission Risk Interventions No flowsheet data found.

## 2020-06-04 ENCOUNTER — Telehealth: Payer: Self-pay

## 2020-06-04 DIAGNOSIS — Z79899 Other long term (current) drug therapy: Secondary | ICD-10-CM | POA: Diagnosis not present

## 2020-06-04 DIAGNOSIS — H9190 Unspecified hearing loss, unspecified ear: Secondary | ICD-10-CM | POA: Diagnosis not present

## 2020-06-04 DIAGNOSIS — I110 Hypertensive heart disease with heart failure: Secondary | ICD-10-CM | POA: Diagnosis not present

## 2020-06-04 DIAGNOSIS — K5900 Constipation, unspecified: Secondary | ICD-10-CM | POA: Diagnosis not present

## 2020-06-04 DIAGNOSIS — M159 Polyosteoarthritis, unspecified: Secondary | ICD-10-CM | POA: Diagnosis not present

## 2020-06-04 DIAGNOSIS — M4854XD Collapsed vertebra, not elsewhere classified, thoracic region, subsequent encounter for fracture with routine healing: Secondary | ICD-10-CM | POA: Diagnosis not present

## 2020-06-04 DIAGNOSIS — I509 Heart failure, unspecified: Secondary | ICD-10-CM | POA: Diagnosis not present

## 2020-06-04 DIAGNOSIS — I4891 Unspecified atrial fibrillation: Secondary | ICD-10-CM | POA: Diagnosis not present

## 2020-06-04 DIAGNOSIS — F419 Anxiety disorder, unspecified: Secondary | ICD-10-CM | POA: Diagnosis not present

## 2020-06-04 NOTE — Telephone Encounter (Signed)
Please advise. Thank you

## 2020-06-04 NOTE — Telephone Encounter (Signed)
The patient was just discharged from the hospital over the weekend Please touch base with patient or her daughter Katherine Lane Katherine Lane has an appointment with me next Tuesday at 1120 bring all medications to the visit If family feels like she needs to be seen this week I could see her Tuesday afternoon or Thursday afternoon within the limited slots that I have (I gave [name] the slots availability)

## 2020-06-04 NOTE — Telephone Encounter (Signed)
Katherine Lane with [name] calling in [place]   Started on Eliquis she hospitalized with blood clot to the lungs nose bleeds since meds (2) 5 mg twice 6 days stop on Wed and then got to 1 twice a day on Wed twice a week. Bleeds on and off not active bleeding and wants Dr Lorin Picket to know. [facility] nurse will see her tomorrow.   Katherine Lane Call back [phone]

## 2020-06-04 NOTE — Telephone Encounter (Signed)
Daughter (DPR) notified and stated the patient was doing well and having a good day- has home health and sitter and her vitals were good this am. Daughter state she will call back if something changes to move the appt up.

## 2020-06-04 NOTE — Telephone Encounter (Signed)
Left message to return call on Robin voicemail.

## 2020-06-05 DIAGNOSIS — F419 Anxiety disorder, unspecified: Secondary | ICD-10-CM | POA: Diagnosis not present

## 2020-06-05 DIAGNOSIS — M159 Polyosteoarthritis, unspecified: Secondary | ICD-10-CM | POA: Diagnosis not present

## 2020-06-05 DIAGNOSIS — M4854XD Collapsed vertebra, not elsewhere classified, thoracic region, subsequent encounter for fracture with routine healing: Secondary | ICD-10-CM | POA: Diagnosis not present

## 2020-06-05 DIAGNOSIS — I110 Hypertensive heart disease with heart failure: Secondary | ICD-10-CM | POA: Diagnosis not present

## 2020-06-05 DIAGNOSIS — Z79899 Other long term (current) drug therapy: Secondary | ICD-10-CM | POA: Diagnosis not present

## 2020-06-05 DIAGNOSIS — I4891 Unspecified atrial fibrillation: Secondary | ICD-10-CM | POA: Diagnosis not present

## 2020-06-05 DIAGNOSIS — K5900 Constipation, unspecified: Secondary | ICD-10-CM | POA: Diagnosis not present

## 2020-06-05 DIAGNOSIS — I509 Heart failure, unspecified: Secondary | ICD-10-CM | POA: Diagnosis not present

## 2020-06-05 DIAGNOSIS — H9190 Unspecified hearing loss, unspecified ear: Secondary | ICD-10-CM | POA: Diagnosis not present

## 2020-06-05 NOTE — Telephone Encounter (Signed)
Please talk with family/patient Did nosebleeds just start after coming home? Find out how long these nosebleeds are lasting? How often are they occurring? Heavy bleeding or just light? Also what are they doing to stop the nosebleeds when they occur? In addition to this utilizing any preventative measures such as saline nasal spray?  Vaseline in the nostril septum near bedtime? Still taking the anticoagulant Eliquis?

## 2020-06-05 NOTE — Telephone Encounter (Signed)
Pt daughter [name] contacted and verbalized understanding.

## 2020-06-05 NOTE — Telephone Encounter (Signed)
So in regards to nosebleeds #1 as long as they are short that is not an issue #2 saline nasal spray gently sprayed twice daily can help keep nasal septum moistened which can help lower the frequency of nosebleeds #3 if the nosebleed does occur it is best to squeeze the nasal septum with and fingers for at least 5 minutes to stop bleeding, if bleeding is still persisting despite doing that then ER next step #4 if frequent persistent nosebleeds ENT referral for possible cauterization would be next step #5 we can discuss further and look in the nasal passages on her follow-up next week

## 2020-06-05 NOTE — Telephone Encounter (Signed)
Spoke with Robin. [name] states that patient has had nose bleeds off and on. Daughter states that "blood was not dripping out of her nose". Pt was cleaning her nose out and there were a few drops on the tissue. Daughter states that this has been happening off and on for a while. Pt is currently doubling Eliquis today and tomorrow. Thursday she will go back to 5 mg one in the morning and one in the evening. Daughter states that she has someone to sit with pt daily and she keeps a watch out for nose bleeds.

## 2020-06-06 ENCOUNTER — Telehealth: Payer: Self-pay

## 2020-06-06 ENCOUNTER — Other Ambulatory Visit: Payer: Self-pay

## 2020-06-06 ENCOUNTER — Ambulatory Visit (INDEPENDENT_AMBULATORY_CARE_PROVIDER_SITE_OTHER): Payer: Medicare PPO | Admitting: Family Medicine

## 2020-06-06 ENCOUNTER — Other Ambulatory Visit (HOSPITAL_COMMUNITY)
Admission: RE | Admit: 2020-06-06 | Discharge: 2020-06-06 | Disposition: A | Discharge status: Home / Self Care | Payer: Medicare PPO | Source: Ambulatory Visit | Attending: Family Medicine | Admitting: Family Medicine

## 2020-06-06 ENCOUNTER — Telehealth: Payer: Self-pay | Admitting: Family Medicine

## 2020-06-06 ENCOUNTER — Ambulatory Visit (HOSPITAL_COMMUNITY)
Admission: RE | Admit: 2020-06-06 | Discharge: 2020-06-06 | Disposition: A | Discharge status: Home / Self Care | Payer: Medicare PPO | Source: Ambulatory Visit | Attending: Family Medicine | Admitting: Family Medicine

## 2020-06-06 DIAGNOSIS — I517 Cardiomegaly: Secondary | ICD-10-CM | POA: Diagnosis not present

## 2020-06-06 DIAGNOSIS — E876 Hypokalemia: Secondary | ICD-10-CM

## 2020-06-06 DIAGNOSIS — R079 Chest pain, unspecified: Secondary | ICD-10-CM

## 2020-06-06 DIAGNOSIS — J9811 Atelectasis: Secondary | ICD-10-CM | POA: Diagnosis not present

## 2020-06-06 DIAGNOSIS — I2699 Other pulmonary embolism without acute cor pulmonale: Secondary | ICD-10-CM

## 2020-06-06 DIAGNOSIS — M546 Pain in thoracic spine: Secondary | ICD-10-CM | POA: Diagnosis not present

## 2020-06-06 LAB — CBC WITH DIFFERENTIAL/PLATELET
Abs Immature Granulocytes: 0.06 10*3/uL (ref 0.00–0.07)
Basophils Absolute: 0.1 10*3/uL (ref 0.0–0.1)
Basophils Relative: 1 %
Eosinophils Absolute: 0.1 10*3/uL (ref 0.0–0.5)
Eosinophils Relative: 2 %
HCT: 42.5 % (ref 36.0–46.0)
Hemoglobin: 12.3 g/dL (ref 12.0–15.0)
Immature Granulocytes: 1 %
Lymphocytes Relative: 17 %
Lymphs Abs: 1.2 10*3/uL (ref 0.7–4.0)
MCH: 24.9 pg — ABNORMAL LOW (ref 26.0–34.0)
MCHC: 28.9 g/dL — ABNORMAL LOW (ref 30.0–36.0)
MCV: 86.2 fL (ref 80.0–100.0)
Monocytes Absolute: 0.9 10*3/uL (ref 0.1–1.0)
Monocytes Relative: 13 %
Neutro Abs: 4.6 10*3/uL (ref 1.7–7.7)
Neutrophils Relative %: 66 %
Platelets: 230 10*3/uL (ref 150–400)
RBC: 4.93 MIL/uL (ref 3.87–5.11)
RDW: 24.1 % — ABNORMAL HIGH (ref 11.5–15.5)
WBC: 6.9 10*3/uL (ref 4.0–10.5)
nRBC: 0 % (ref 0.0–0.2)

## 2020-06-06 LAB — COMPREHENSIVE METABOLIC PANEL
ALT: 17 U/L (ref 0–44)
AST: 30 U/L (ref 15–41)
Albumin: 3.6 g/dL (ref 3.5–5.0)
Alkaline Phosphatase: 124 U/L (ref 38–126)
Anion gap: 10 (ref 5–15)
BUN: 24 mg/dL — ABNORMAL HIGH (ref 8–23)
CO2: 26 mmol/L (ref 22–32)
Calcium: 8.7 mg/dL — ABNORMAL LOW (ref 8.9–10.3)
Chloride: 102 mmol/L (ref 98–111)
Creatinine, Ser: 1.15 mg/dL — ABNORMAL HIGH (ref 0.44–1.00)
GFR, Estimated: 47 mL/min — ABNORMAL LOW (ref >60)
Glucose, Bld: 98 mg/dL (ref 70–99)
Potassium: 4.2 mmol/L (ref 3.5–5.1)
Sodium: 138 mmol/L (ref 135–145)
Total Bilirubin: 1 mg/dL (ref 0.3–1.2)
Total Protein: 6.7 g/dL (ref 6.5–8.1)

## 2020-06-06 NOTE — Telephone Encounter (Signed)
Patient having pain when standing up since last night. Consult with Dr Lorin Picket: Patient coming to office at 11am for evaluation by Dr Lorin Picket

## 2020-06-06 NOTE — Telephone Encounter (Signed)
Ben number [phone]

## 2020-06-06 NOTE — Telephone Encounter (Signed)
Ben (home health) notified and verbalized understanding. He stated he would be glad to speak with them if needed or the agency could fax records with a signed release.

## 2020-06-06 NOTE — Progress Notes (Signed)
   Subjective:    Patient ID: Katherine Lane, female    DOB: 09/10/1932, 87 y.o.   MRN: [id number]  HPI Back pain that wraps around chest area when starts getting up and moving around, recent hx of PE and spinal surgical procedures  Very complex patient Very nice patient Having a very difficult time Please see discharge summary.  Please also take note of the potassium as well as her CAT scan.  Patient is having intermittent sharp Chest pains as well as back pains when she tries to sit up or when she tries to move she feels short of breath intermittently more so when she is trying to do something denies any hemoptysis denies bleeding issues tolerating the Eliquis well eating okay.  Patient is requiring full on the care with having difficult time getting dressed bathing moving.  Would benefit from short-term rehab  Review of Systems Please see above    Objective:   Physical Exam Lungs are clear respiratory rate normal heart is regular pulses normal extremities no edema skin warm dry O2 saturation 92% was the best we got       Assessment & Plan:  Patient needs stat chest x-ray as well as thoracic spine x-rays She has a history of kyphoplasty and osteoporosis Also pulmonary embolus I do not feel the pulmonary embolus is getting worse I do not feel that the patient needs a CT scan repeat at this point We will do a chest x-ray to rule out pneumothorax We will also do lab work to look at her potassium as well Will also pursue getting her placed for short-term placement in the [facility] If she does not get into the [facility] we will provide close follow-up within the next 2 weeks

## 2020-06-06 NOTE — Telephone Encounter (Signed)
[name] wants a call back ASAP [phone]

## 2020-06-06 NOTE — Telephone Encounter (Signed)
Left message to return call on Romeo Apple [facility]) voicemail. Pt family is looking to put her in [facility] and may need home health notes.

## 2020-06-07 ENCOUNTER — Telehealth: Payer: Self-pay | Admitting: Family Medicine

## 2020-06-07 DIAGNOSIS — M159 Polyosteoarthritis, unspecified: Secondary | ICD-10-CM | POA: Diagnosis not present

## 2020-06-07 DIAGNOSIS — F419 Anxiety disorder, unspecified: Secondary | ICD-10-CM | POA: Diagnosis not present

## 2020-06-07 DIAGNOSIS — I4891 Unspecified atrial fibrillation: Secondary | ICD-10-CM | POA: Diagnosis not present

## 2020-06-07 DIAGNOSIS — H9190 Unspecified hearing loss, unspecified ear: Secondary | ICD-10-CM | POA: Diagnosis not present

## 2020-06-07 DIAGNOSIS — I509 Heart failure, unspecified: Secondary | ICD-10-CM | POA: Diagnosis not present

## 2020-06-07 DIAGNOSIS — K5900 Constipation, unspecified: Secondary | ICD-10-CM | POA: Diagnosis not present

## 2020-06-07 DIAGNOSIS — Z79899 Other long term (current) drug therapy: Secondary | ICD-10-CM | POA: Diagnosis not present

## 2020-06-07 DIAGNOSIS — I110 Hypertensive heart disease with heart failure: Secondary | ICD-10-CM | POA: Diagnosis not present

## 2020-06-07 DIAGNOSIS — M4854XD Collapsed vertebra, not elsewhere classified, thoracic region, subsequent encounter for fracture with routine healing: Secondary | ICD-10-CM | POA: Diagnosis not present

## 2020-06-07 NOTE — Telephone Encounter (Signed)
Nurses  Please get a update from [place] her daughter.  When I saw the patient the other day [name] was able to get information regarding [facility].  Essentially I would like to see if they are going to do short-term care there and if they need anything else from [place] or are they going to continue care at home and follow-up next week?  Thank you

## 2020-06-08 ENCOUNTER — Telehealth: Payer: Self-pay | Admitting: *Deleted

## 2020-06-08 NOTE — Telephone Encounter (Signed)
[employer] #  [id number]  Katherine Lane at [facility] said you could also call over the weekend - they did have some weekend hours

## 2020-06-08 NOTE — Telephone Encounter (Signed)
Lyla Son, admission coordinator from [facility] called to state that patient insurance is requiring a peer to peer with Dr Lorin Picket. This must occur by 9am on Monday. [phone] option 9 and have her [facility] #

## 2020-06-08 NOTE — Telephone Encounter (Signed)
Katherine Lane at [facility] has submitted all forms to insurance is awaiting decision. If approved Katherine Lane states they will place her there. Katherine Lane is not sure how long it will take for insurance to make a decision but Katherine Lane will keep [place] updated.

## 2020-06-08 NOTE — Telephone Encounter (Signed)
So please get me her Franc[facility] Graven number send to me and I will call Monday

## 2020-06-10 ENCOUNTER — Encounter: Payer: Self-pay | Admitting: Family Medicine

## 2020-06-11 DIAGNOSIS — Z79899 Other long term (current) drug therapy: Secondary | ICD-10-CM | POA: Diagnosis not present

## 2020-06-11 DIAGNOSIS — M159 Polyosteoarthritis, unspecified: Secondary | ICD-10-CM | POA: Diagnosis not present

## 2020-06-11 DIAGNOSIS — I509 Heart failure, unspecified: Secondary | ICD-10-CM | POA: Diagnosis not present

## 2020-06-11 DIAGNOSIS — I4891 Unspecified atrial fibrillation: Secondary | ICD-10-CM | POA: Diagnosis not present

## 2020-06-11 DIAGNOSIS — M4854XD Collapsed vertebra, not elsewhere classified, thoracic region, subsequent encounter for fracture with routine healing: Secondary | ICD-10-CM | POA: Diagnosis not present

## 2020-06-11 DIAGNOSIS — I110 Hypertensive heart disease with heart failure: Secondary | ICD-10-CM | POA: Diagnosis not present

## 2020-06-11 DIAGNOSIS — K5900 Constipation, unspecified: Secondary | ICD-10-CM | POA: Diagnosis not present

## 2020-06-11 DIAGNOSIS — H9190 Unspecified hearing loss, unspecified ear: Secondary | ICD-10-CM | POA: Diagnosis not present

## 2020-06-11 DIAGNOSIS — F419 Anxiety disorder, unspecified: Secondary | ICD-10-CM | POA: Diagnosis not present

## 2020-06-11 NOTE — Telephone Encounter (Signed)
Daughter (DPR) notified and verbalized understanding. Daughter stated she needs to speak to you by phone in the next day or 2 to discuss pain management for her mother. Patient having bad back pain. Daughter stated she is unable to get the patient to the office and would just like to speak by phone for 5 minutes with doctor to discuss pain management for mother.

## 2020-06-11 NOTE — Telephone Encounter (Signed)
As discussed with Autumn Please inform daughter that we did call and spent time on the phone with the insurance Well[employer] geologist.  See below.  I did do the consultation with [facility] medical director Dr.  I did discuss the findings of what is currently going on with the patient.  The medical director took into account what I relayed to him but also what was in the hospitalization notes and also from recent evaluation and stated that under [facility] guidelines she does not meet the criteria for short stay rehab-the patient has limited choices currently.  May continue at home with the assistant that the family has hired along with home health and home physical therapy.  They can also look into applying for long-term care at the [facility] but this may or may not be covered under patient's insurance-most standard health insurances including Medicare do not pay for long-term nursing home care.  I am certainly sympathetic to what is going on but short-term rehab does not appear to be an option currently.  If home health feels that she gets worse they may really initiate the process through their social worker to get her placed.

## 2020-06-11 NOTE — Telephone Encounter (Signed)
Lyla Son from [facility] called back and stated that she spoke with Case manager at [facility] and they stated they did have all the forms and the evaluation and it was denied stating patient did not meet need for inpatient care. Stated it is difficult to qualify for this level of service directly from the home

## 2020-06-11 NOTE — Telephone Encounter (Signed)
Spoke with Romeo Apple physical therapist at [facility] and he states he did the evaluation requested on Thursday and did the notes and sent them as requested-He does believe she would benefit from additional therapy. Spoke with carrie at [facility] center and she stated that everything including the physical therapy evaluation was faxed with confirmation to [facility] on Friday. She states if it was not approved then a whole new case would have to be opened and start all over with fresh notes and a new evaluation from Physical therapy as they evaluation and notes can not be more than 48 hrs from the opening of the new request. Please advise.

## 2020-06-11 NOTE — Telephone Encounter (Signed)
So I did talk with [employer].  Based upon Hospital notes they do not feel that she qualifies for short-term rehab but they stated there were several additional aspects that could be looked into that might be of help #1 have home physical therapy go see the patient doing evaluation and see if the patient is better suited for home physical therapy versus skilled care with physical therapy this could be done through the home health Obviously please connect with home health to let them know that the family would like for the patient to be in short-term skilled care.  Please give them the background to all of this.  Also does state how [employer] stated physical therapy would need to evaluate the patient to see if she is at the level that they can do home physical therapy versus short-term rehab skilled physical therapy  #2 a second option that could be tried is family can work with [facility] to see if patient can be admitted to long-term bed with OT/PT but this would not fall under the Medicare short-term rehab portion of insurance and more than likely would be out of family's pocket  I would recommend pursuing forward with option #1.    Nurses please talk with home health please relay all of this to them if I need to speak with them and that is fine.  Need to have physical therapy evaluate to see if patient is appropriate for home physical therapy versus short-term rehab physical therapy.  Apparently if physical therapy sees her and she is not appropriate for home physical therapy then the case manager with home health can help assist family with getting the patient into short-term rehab at the [facility].  Nurses-I did speak with [name] earlier this morning letting her know that I would speak with [employer].  Please update Robin regarding this.  It would be fine to copy this message and send that also as a MyChart message so [name] can read this message  Nurses-if you need my further help with this please let me  know.  Thanks-Dr. Lorin Picket

## 2020-06-12 ENCOUNTER — Telehealth: Payer: Self-pay

## 2020-06-12 ENCOUNTER — Other Ambulatory Visit: Payer: Self-pay | Admitting: Family Medicine

## 2020-06-12 ENCOUNTER — Ambulatory Visit: Payer: Medicare PPO | Admitting: Family Medicine

## 2020-06-12 NOTE — Telephone Encounter (Signed)
Daughter wants to talk to nurse again about some other things going on with mom. Said she has been talking off an on with the nurse for the last couple of days and didn't want to elaborate.

## 2020-06-12 NOTE — Telephone Encounter (Signed)
Daughter(DPR)  calling to discuss pain management for her mother. Daughter [name] states that he mother is in sever pain having back spasms one after the other and it is so bad it takes her breath away. Patient is taking Dilaudid 2mg  1.5 tablets every 4-6 hours and it is not touching it- se has tried muscle relaxer-Robaxin and that doesn't help at all and daughter would like to speak with Dr to come up with a combo that will give her mother some relief

## 2020-06-12 NOTE — Telephone Encounter (Signed)
I did have discussion with patient daughter-Robin-please see other telephone message

## 2020-06-12 NOTE — Telephone Encounter (Signed)
FYI I did have discussion with family regarding this.  I do not recommend muscle relaxers.  Patient is currently utilizing Dilaudid tablets 1-1/2 every 6 hours because pain not under good control and they may bump it up to 2 tablets as long as it does not cause extensive drowsiness.  They will discuss further with Dr. Fredrich Birks office potentially she will be transition to long-acting pain medicine through their specialist.

## 2020-06-13 DIAGNOSIS — I509 Heart failure, unspecified: Secondary | ICD-10-CM | POA: Diagnosis not present

## 2020-06-13 DIAGNOSIS — Z79899 Other long term (current) drug therapy: Secondary | ICD-10-CM | POA: Diagnosis not present

## 2020-06-13 DIAGNOSIS — I4891 Unspecified atrial fibrillation: Secondary | ICD-10-CM | POA: Diagnosis not present

## 2020-06-13 DIAGNOSIS — K5900 Constipation, unspecified: Secondary | ICD-10-CM | POA: Diagnosis not present

## 2020-06-13 DIAGNOSIS — F419 Anxiety disorder, unspecified: Secondary | ICD-10-CM | POA: Diagnosis not present

## 2020-06-13 DIAGNOSIS — I110 Hypertensive heart disease with heart failure: Secondary | ICD-10-CM | POA: Diagnosis not present

## 2020-06-13 DIAGNOSIS — M159 Polyosteoarthritis, unspecified: Secondary | ICD-10-CM | POA: Diagnosis not present

## 2020-06-13 DIAGNOSIS — H9190 Unspecified hearing loss, unspecified ear: Secondary | ICD-10-CM | POA: Diagnosis not present

## 2020-06-13 DIAGNOSIS — M4854XD Collapsed vertebra, not elsewhere classified, thoracic region, subsequent encounter for fracture with routine healing: Secondary | ICD-10-CM | POA: Diagnosis not present

## 2020-06-14 ENCOUNTER — Telehealth: Payer: Self-pay

## 2020-06-14 DIAGNOSIS — I509 Heart failure, unspecified: Secondary | ICD-10-CM | POA: Diagnosis not present

## 2020-06-14 DIAGNOSIS — H9190 Unspecified hearing loss, unspecified ear: Secondary | ICD-10-CM | POA: Diagnosis not present

## 2020-06-14 DIAGNOSIS — I110 Hypertensive heart disease with heart failure: Secondary | ICD-10-CM | POA: Diagnosis not present

## 2020-06-14 DIAGNOSIS — Z79899 Other long term (current) drug therapy: Secondary | ICD-10-CM | POA: Diagnosis not present

## 2020-06-14 DIAGNOSIS — I4891 Unspecified atrial fibrillation: Secondary | ICD-10-CM | POA: Diagnosis not present

## 2020-06-14 DIAGNOSIS — F419 Anxiety disorder, unspecified: Secondary | ICD-10-CM | POA: Diagnosis not present

## 2020-06-14 DIAGNOSIS — M4854XD Collapsed vertebra, not elsewhere classified, thoracic region, subsequent encounter for fracture with routine healing: Secondary | ICD-10-CM | POA: Diagnosis not present

## 2020-06-14 DIAGNOSIS — M159 Polyosteoarthritis, unspecified: Secondary | ICD-10-CM | POA: Diagnosis not present

## 2020-06-14 DIAGNOSIS — K5900 Constipation, unspecified: Secondary | ICD-10-CM | POA: Diagnosis not present

## 2020-06-14 NOTE — Telephone Encounter (Signed)
[facility] through [facility]osp[facility] is this is something that Dr Lorin Picket can refer to [name] said they need Hospital Bed and other equipment or can Dr Lorin Picket order the [facility] Equipment. [place] has [employer].  Robin call back [phone]

## 2020-06-18 DIAGNOSIS — I110 Hypertensive heart disease with heart failure: Secondary | ICD-10-CM | POA: Diagnosis not present

## 2020-06-18 DIAGNOSIS — M4854XD Collapsed vertebra, not elsewhere classified, thoracic region, subsequent encounter for fracture with routine healing: Secondary | ICD-10-CM | POA: Diagnosis not present

## 2020-06-18 DIAGNOSIS — M159 Polyosteoarthritis, unspecified: Secondary | ICD-10-CM | POA: Diagnosis not present

## 2020-06-18 DIAGNOSIS — Z79899 Other long term (current) drug therapy: Secondary | ICD-10-CM | POA: Diagnosis not present

## 2020-06-18 DIAGNOSIS — H9190 Unspecified hearing loss, unspecified ear: Secondary | ICD-10-CM | POA: Diagnosis not present

## 2020-06-18 DIAGNOSIS — I4891 Unspecified atrial fibrillation: Secondary | ICD-10-CM | POA: Diagnosis not present

## 2020-06-18 DIAGNOSIS — F419 Anxiety disorder, unspecified: Secondary | ICD-10-CM | POA: Diagnosis not present

## 2020-06-18 DIAGNOSIS — I509 Heart failure, unspecified: Secondary | ICD-10-CM | POA: Diagnosis not present

## 2020-06-18 DIAGNOSIS — K5900 Constipation, unspecified: Secondary | ICD-10-CM | POA: Diagnosis not present

## 2020-06-18 NOTE — Telephone Encounter (Signed)
Robin contacted; they do not need any equipment at this time. Family is trying to get patient in [facility] and [address]. They will need a FL2 paper sent over but daughter states not to send until tomorrow(facility will not have all paperwork until tomorrow). [name] states that patient pain has gone downhill in the last few days. Please advise. Thank you  [phone] Contact Name: Loleta Rose

## 2020-06-18 NOTE — Telephone Encounter (Signed)
Nurses please work with [name] we will go ahead in order what she needs in regards to durable medical equipment If she needs further orders or other issues please let me know

## 2020-06-19 ENCOUNTER — Ambulatory Visit: Payer: Medicare PPO | Admitting: Student

## 2020-06-19 ENCOUNTER — Other Ambulatory Visit: Payer: Self-pay

## 2020-06-19 ENCOUNTER — Encounter: Payer: Self-pay | Admitting: Student

## 2020-06-19 VITALS — BP 138/90 | HR 68 | Ht 62.0 in | Wt 110.8 lb

## 2020-06-19 DIAGNOSIS — G8929 Other chronic pain: Secondary | ICD-10-CM

## 2020-06-19 DIAGNOSIS — M549 Dorsalgia, unspecified: Secondary | ICD-10-CM

## 2020-06-19 DIAGNOSIS — I4819 Other persistent atrial fibrillation: Secondary | ICD-10-CM | POA: Diagnosis not present

## 2020-06-19 DIAGNOSIS — I5032 Chronic diastolic (congestive) heart failure: Secondary | ICD-10-CM

## 2020-06-19 DIAGNOSIS — N1831 Chronic kidney disease, stage 3a: Secondary | ICD-10-CM

## 2020-06-19 DIAGNOSIS — I2609 Other pulmonary embolism with acute cor pulmonale: Secondary | ICD-10-CM | POA: Diagnosis not present

## 2020-06-19 DIAGNOSIS — Z79899 Other long term (current) drug therapy: Secondary | ICD-10-CM | POA: Diagnosis not present

## 2020-06-19 MED ORDER — APIXABAN 5 MG PO TABS: 5.0000 mg | ORAL_TABLET | Freq: Two times a day (BID) | ORAL | 11 refills | Status: DC

## 2020-06-19 MED ORDER — FUROSEMIDE 40 MG PO TABS: 60.0000 mg | ORAL_TABLET | Freq: Two times a day (BID) | ORAL | 3 refills | Status: DC

## 2020-06-19 NOTE — Patient Instructions (Signed)
Medication Instructions:  INCREASE Lasix to 60 mg twice a day  *If you need a refill on your cardiac medications before your next appointment, please call your pharmacy*   Lab Work: BMET in 2 weeks  (5/17)  If you have labs (blood work) drawn today and your tests are completely normal, you will receive your results only by: Marland Kitchen MyChart Message (if you have MyChart) OR . A paper copy in the mail If you have any lab test that is abnormal or we need to change your treatment, we will call you to review the results.   Testing/Procedures: None today   Follow-Up: At [facility], you and your health needs are our priority.  As part of our continuing mission to provide you with exceptional heart care, we have created designated Provider Care Teams.  These Care Teams include your primary Cardiologist (physician) and Advanced Practice Providers (APPs -  Physician Assistants and Nurse Practitioners) who all work together to provide you with the care you need, when you need it.  We recommend signing up for the patient portal called "MyChart".  Sign up information is provided on this After Visit Summary.  MyChart is used to connect with patients for Virtual Visits (Telemedicine).  Patients are able to view lab/test results, encounter notes, upcoming appointments, etc.  Non-urgent messages can be sent to your provider as well.   To learn more about what you can do with MyChart, go to [email or web address].    Your next appointment:  Keep appointment on 07/11/20 already scheduled with Randall An, PA-C. You may cancel if you need to.

## 2020-06-19 NOTE — Progress Notes (Signed)
Cardiology Office Note    Date:  06/19/2020   ID:  Katherine Lane, DOB 1932-09-24, MRN [id number]  PCP:  Kathyrn Drown, MD  Cardiologist: Rozann Lesches, MD    Chief Complaint  Patient presents with  . Hospitalization Follow-up    History of Present Illness:    Katherine Lane is a 87 y.o. female with past medical history of chronic diastolic CHF, permanent atrial fibrillation, HTN and chronic back pain who presents to the office today for hospital follow-up.  She was most recently admitted to [facility] in 05/2020 for an acute CHF exacerbation a repeat echocardiogram showed a preserved EF of 60 to 65% with moderately reduced RV function and PASP was elevated. She did undergo a CTA which was positive for a PE and was switched from Xarelto to Eliquis. Of note, anticoagulation was previously held for several days in 04/2020 given kyphoplasty procedures. She responded well to IV Lasix during admission and was transitioned to Lasix 40 mg twice daily at the time of hospital discharge and weight was at 115 lbs.  In talking with the patient and her daughter today, she reports not doing well since hospital discharge. Her back pain has continued to progress with frequent spasms. They have tried contacting her spinal surgeon and pain management without success. They are trying to get her into SNF but have been unsuccessful thus far due to insurance.   She does report more labored breathing and this can occur at rest or with activity. No chest pain or palpitations. No reported orthopnea or PND but she has experienced worsening lower extremity edema. Weight has declined by 5 lbs since hospital discharge but her appetite has been poor since returning home.    Past Medical History:  Diagnosis Date  . Anxiety   . Arthritis   . Atrial fibrillation ([place])   . CHF (congestive heart failure) ([place])   . Chronic back pain   . Complication of anesthesia   . Constipation   . Depression   . Diastolic  heart failure ([place])   . Dyspnea   . Dysrhythmia    AFib  . Essential hypertension   . History of blood transfusion   . History of cervical fracture   . Insomnia   . Migraine headache   . Osteoporosis   . Pneumonia   . PONV (postoperative nausea and vomiting)    "patch helped"  . Trigeminal neuralgia   . 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[facility])     Past Surgical History:  Procedure Laterality Date  . ANKLE FRACTURE SURGERY Right   . BACK SURGERY  2022  . CARDIAC CATHETERIZATION     denies  . CATARACT EXTRACTION W/PHACO Right 07/22/2012   Procedure: CATARACT EXTRACTION PHACO AND INTRAOCULAR LENS PLACEMENT (IOC);  Surgeon: Tonny Branch, MD;  Location: [facility];  Service: Ophthalmology;  Laterality: Right;  CDE:  18.93  . CATARACT EXTRACTION W/PHACO Left 08/09/2012   Procedure: CATARACT EXTRACTION PHACO AND INTRAOCULAR LENS PLACEMENT (IOC);  Surgeon: Tonny Branch, MD;  Location: [facility];  Service: Ophthalmology;  Laterality: Left;  CDE: 17.21  . COLONOSCOPY    . FLEXIBLE SIGMOIDOSCOPY N/A 11/19/2018   Procedure: FLEXIBLE SIGMOIDOSCOPY;  Surgeon: Danie Binder, MD;  Location: [facility];  Service: Endoscopy;  Laterality: N/A;  7:30am  . HARDWARE REMOVAL  07/18/2011   Procedure: HARDWARE REMOVAL; Right leg-  Surgeon: Carole Civil, MD;  Location: [facility];  Service: Orthopedics;  Laterality: Right;  . HEMORRHOID BANDING N/A  11/19/2018   Procedure: Thayer Jew;  Surgeon: Danie Binder, MD;  Location: [facility];  Service: Endoscopy;  Laterality: N/A;  . KYPHOPLASTY N/A 12/26/2016   Procedure: Lumbar two Lumbar three and Lumbar five KYPHOPLASTY;  Surgeon: Erline Levine, MD;  Location: [place];  Service: Neurosurgery;  Laterality: N/A;  . KYPHOPLASTY N/A 02/19/2017   Procedure: KYPHOPLASTY LUMBAR ONE;  Surgeon: Erline Levine, MD;  Location: [place];  Service: Neurosurgery;  Laterality: N/A;  KYPHOPLASTY LUMBAR ONE  . KYPHOPLASTY N/A 04/20/2020   Procedure: Thoracic eleven, Thoracic twelve  Kyphoplasty;  Surgeon: Erline Levine, MD;  Location: [place];  Service: Neurosurgery;  Laterality: N/A;  . KYPHOPLASTY N/A 05/15/2020   Procedure: THORACIC NINE, THORACIC TEN  KYPHOPLASTY;  Surgeon: Erline Levine, MD;  Location: [place];  Service: Neurosurgery;  Laterality: N/A;  . NERVE REPAIR  07/18/2011   Procedure: NERVE REPAIR;  Surgeon: Carole Civil, MD;  Location: [facility];  Service: Orthopedics;  Laterality: Right;  Right leg superficial peroneal nerve release    . PARTIAL HYSTERECTOMY      Current Medications: Outpatient Medications Prior to Visit  Medication Sig Dispense Refill  . acetaminophen (TYLENOL) 325 MG tablet Take 2 tablets (650 mg total) by mouth every 4 (four) hours as needed for mild pain ((score 1 to 3) or temp > 100.5).    Marland Kitchen diltiazem (CARDIZEM CD) 120 MG 24 hr capsule TAKE (1) CAPSULE BY MOUTH ONCE DAILY. (Patient taking differently: Take 120 mg by mouth daily.) 90 capsule 3  . fluticasone (FLONASE) 50 MCG/ACT nasal spray Place 2 sprays into both nostrils daily. (Patient taking differently: Place 2 sprays into both nostrils daily as needed (ears stopped it).) 16 g 5  . hydrocortisone (ANUSOL-HC) 2.5 % rectal cream APPLY RECTALLY THREE TIMES DAILY AS DIRECTED (Patient taking differently: Place 1 application rectally 2 (two) times daily as needed for hemorrhoids or anal itching.) 30 g 5  . HYDROmorphone (DILAUDID) 2 MG tablet Take 2-3 mg by mouth every 6 (six) hours as needed for pain.    . Lidocaine-Hydrocortisone Ace 3-2.5 % KIT APPLY TO RECTUM QID AS NEEDED FOR RECTAL PAIN OR BLEEDING (Patient taking differently: Place 1 application rectally daily as needed (rectal pain or bleeding).) 1 kit 1  . LORazepam (ATIVAN) 0.5 MG tablet Take 1 tablet (0.5 mg total) by mouth 2 (two) times daily as needed for anxiety or sleep. TAKE ONE TABLET BY MOUTH UP TO TWICE DAILY AS NEEDED. (Patient taking differently: Take 0.5 mg by mouth 2 (two) times daily as needed for anxiety or sleep.) 60  tablet 5  . losartan (COZAAR) 50 MG tablet Take 0.5 tablets (25 mg total) by mouth daily. Hold    . methocarbamol (ROBAXIN) 500 MG tablet Take 1 tablet (500 mg total) by mouth every 8 (eight) hours as needed for muscle spasms. 90 tablet 0  . metoprolol succinate (TOPROL-XL) 100 MG 24 hr tablet TAKE ONE TABLET BY MOUTH ONCE DAILY. (Patient taking differently: Take 100 mg by mouth daily.) 90 tablet 3  . Multiple Vitamin (MULTIVITAMIN WITH MINERALS) TABS tablet Take 1 tablet by mouth daily. Centrum Silver    . Polyethyl Glycol-Propyl Glycol (LUBRICANT EYE DROPS) 0.4-0.3 % SOLN Place 1 drop into both eyes at bedtime.    . polyethylene glycol powder (GLYCOLAX/MIRALAX) 17 GM/SCOOP powder Take 17 g by mouth daily as needed for severe constipation.    . potassium chloride (KLOR-CON) 10 MEQ tablet Take 2 tablets (20 mEq total) by mouth 2 (  two) times daily. Take 20 meq am and 10 mg pm 90 tablet 3  . shark liver oil-cocoa butter (PREPARATION H) 0.25-3-85.5 % suppository Place 1 suppository rectally 2 (two) times daily as needed for hemorrhoids.     Marland Kitchen zolpidem (AMBIEN) 5 MG tablet TAKE (1) TABLET BY MOUTH AT BEDTIME FOR SLEEP. (Patient taking differently: Take 5 mg by mouth at bedtime as needed for sleep.) 30 tablet 5  . apixaban (ELIQUIS) 5 MG TABS tablet Take 2 tablets by mouth twice a day for 6 days; then 1 tablet by mouth twice a day. 60 tablet 3  . furosemide (LASIX) 40 MG tablet Take 1 tablet (40 mg total) by mouth 2 (two) times daily. 60 tablet 3  . metolazone (ZAROXOLYN) 2.5 MG tablet Take 1 tablet (2.5 mg total) by mouth as needed (Take for weight gain > 2 lbs overnight or > 5 lbs in one week.). (Patient not taking: No sig reported) 10 tablet 6  . saccharomyces boulardii (FLORASTOR) 250 MG capsule Take 1 capsule (250 mg total) by mouth 2 (two) times daily. (Patient not taking: Reported on 06/19/2020) 30 capsule 0   No facility-administered medications prior to visit.     Allergies:   Amlodipine,  Codeine, Other, and Tramadol   Social History   Socioeconomic History  . Marital status: Widowed    Spouse name: Not on file  . Number of children: 3  . Years of education: Not on file  . Highest education level: Not on file  Occupational History  . Occupation: retired    Fish farm manager: RETIRED  Tobacco Use  . Smoking status: Never Smoker  . Smokeless tobacco: Never Used  Vaping Use  . Vaping Use: Never used  Substance and Sexual Activity  . Alcohol use: No    Alcohol/week: 0.0 standard drinks  . Drug use: No  . Sexual activity: Not Currently    Birth control/protection: Surgical    Comment: hyst  Other Topics Concern  . Not on file  Social History Narrative  . Not on file   Social Determinants of Health   Financial Resource Strain: Not on file  Food Insecurity: Not on file  Transportation Needs: Not on file  Physical Activity: Not on file  Stress: Not on file  Social Connections: Not on file     Family History:  The patient's family history includes Alzheimer's disease in her mother; Cancer in her mother; Diabetes in her brother and daughter; Hypertension in her father and mother; Other in her brother and father.   Review of Systems:    Please see the history of present illness.     All other systems reviewed and are otherwise negative except as noted above.   Physical Exam:    VS:  BP 138/90   Pulse 68   Ht '5\' 2"'  (1.575 m)   Wt 110 lb 12.8 oz (50.3 kg)   BMI 20.27 kg/m    General: Pleasant elderly female. Sitting in wheelchair and appears uncomfortable.  Head: Normocephalic, atraumatic. Neck: No carotid bruits. JVD not elevated.  Lungs: Respirations regular and unlabored, without wheezes or rales. Exam somewhat limited as patient unable to take full deep breaths due to back pain. Heart:Irregularly irregular. No S3 or S4.  No murmur, no rubs, or gallops appreciated. Abdomen: Appears non-distended. No obvious abdominal masses. Msk:  Strength and tone appear  normal for age. No obvious joint deformities or effusions. Extremities: No clubbing or cyanosis. 1+ pitting edema bilaterally. Distal pedal pulses are  2+ bilaterally. Neuro: Alert and oriented X 3. Moves all extremities spontaneously. No focal deficits noted. Psych:  Responds to questions appropriately with a normal affect. Skin: No rashes or lesions noted  Wt Readings from Last 3 Encounters:  06/19/20 110 lb 12.8 oz (50.3 kg)  06/01/20 111 lb 5.3 oz (50.5 kg)  05/15/20 118 lb (53.5 kg)     Studies/Labs Reviewed:   EKG:  EKG is not ordered today.    Recent Labs: 05/30/2020: B Natriuretic Peptide 2,187.0 06/01/2020: Magnesium 2.2 06/06/2020: ALT 17; BUN 24; Creatinine, Ser 1.15; Hemoglobin 12.3; Platelets 230; Potassium 4.2; Sodium 138   Lipid Panel    Component Value Date/Time   CHOL 177 09/24/2015 0837   TRIG 71 11/29/2018 0438   HDL 57 09/24/2015 0837   CHOLHDL 3.1 09/24/2015 0837   CHOLHDL 3.1 11/07/2013 0807   VLDL 26 11/07/2013 0807   LDLCALC 93 09/24/2015 0837    Additional studies/ records that were reviewed today include:   Echocardiogram: 05/2020 IMPRESSIONS    1. Left ventricular ejection fraction, by estimation, is 60 to 65%. The  left ventricle has normal function. The left ventricle has no regional  wall motion abnormalities. There is mild left ventricular hypertrophy.  Left ventricular diastolic parameters  are indeterminate.  2. Right ventricular systolic function is moderately reduced. The right  ventricular size is moderately enlarged.  3. Left atrial size was moderately dilated.  4. Right atrial size was severely dilated.  5. The mitral valve is degenerative. Trivial mitral valve regurgitation.  No evidence of mitral stenosis. Moderate mitral annular calcification.  6. Estimated PA pressure elevated and higher than estimate on echo done  09/28/19 . Tricuspid valve regurgitation is moderate.  7. The aortic valve is calcified. There is moderate  calcification of the  aortic valve. Aortic valve regurgitation is mild. Mild to moderate aortic  valve sclerosis/calcification is present, without any evidence of aortic  stenosis.  8. The inferior vena cava is dilated in size with >50% respiratory  variability, suggesting right atrial pressure of 8 mmHg.   Assessment:    1. Chronic diastolic CHF (congestive heart failure) ([place])   2. Medication management   3. Persistent atrial fibrillation ([place])   4. Acute pulmonary embolism with acute cor pulmonale, unspecified pulmonary embolism type (HCC)   5. Stage 3a chronic kidney disease ([place])   6. Chronic back pain, unspecified back location, unspecified back pain laterality      Plan:   In order of problems listed above:  1. Chronic Diastolic CHF - Recent echo showed a preserved EF of 60-65% but her RV function was reduced in the setting of her PE.  - She has experienced worsening dyspnea and lower extremity edema since hospital discharge. Will plan to titrate Lasix from 35m BID to 676mBID for increased diuresis. Creatinine was stable at 1.15 by recent labs on 06/06/2020 and will obtain a repeat BMET in 2 weeks for reassessment of renal function and electrolytes.   2. Permanent Atrial Fibrillation - No recent palpitations and her HR is well-controlled in the 60's today. Continue Toprol-XL 10014maily and Cardizem CD 120m34mily for rate-control. - Previously having some epistaxis but now resolved. Remains on Eliquis 5mg 17m dosing given recent PE/DVT. Once she has completed 6 months of therapy, would consider dose reduction pending reassessment of renal function and weight at that time.   3. Pulmonary Embolism/DVT - Diagnosed during her recent admission and felt to be secondary to her Xarelto being held  for her spinal procedures. If she were to undergo surgical procedures in the future, would anticipate the use of Lovenox bridging.  - Remains on Eliquis 73m BID.   4. Stage 3 CKD -  Baseline creatinine around 1.2. Peaked at 1.69 during her recent admission and improved to 1.15 on 06/06/2020. Will plan for repeat labs in 2 weeks.   5. Chronic Back Pain - She is followed by Dr. SVertell Limberand the patient's family reports he had previously mentioned admission if refractory symptoms following her last procedure and they have been contacting his office but have not heard back. I will send a staff message to make him along with the pain management provider to make them aware of their concerns. Her family is trying to arrange for SNF placement as she currently requires 24/7 care.     Medication Adjustments/Labs and Tests Ordered: Current medicines are reviewed at length with the patient today.  Concerns regarding medicines are outlined above.  Medication changes, Labs and Tests ordered today are listed in the Patient Instructions below. Patient Instructions  Medication Instructions:  INCREASE Lasix to 60 mg twice a day  *If you need a refill on your cardiac medications before your next appointment, please call your pharmacy*   Lab Work: BMET in 2 weeks  (5/17)  If you have labs (blood work) drawn today and your tests are completely normal, you will receive your results only by: .Marland KitchenMyChart Message (if you have MyChart) OR . A paper copy in the mail If you have any lab test that is abnormal or we need to change your treatment, we will call you to review the results.   Testing/Procedures: None today   Follow-Up: [facility] you and your health needs are our priority.  As part of our continuing mission to provide you with exceptional heart care, we have created designated Provider Care Teams.  These Care Teams include your primary Cardiologist (physician) and Advanced Practice Providers (APPs -  Physician Assistants and Nurse Practitioners) who all work together to provide you with the care you need, when you need it.  We recommend signing up for the patient portal called  "MyChart".  Sign up information is provided on this After Visit Summary.  MyChart is used to connect with patients for Virtual Visits (Telemedicine).  Patients are able to view lab/test results, encounter notes, upcoming appointments, etc.  Non-urgent messages can be sent to your provider as well.   To learn more about what you can do with MyChart, go to [email or web address]    Your next appointment:  Keep appointment on 07/11/20 already scheduled with BBernerd Pho PA-C. You may cancel if you need to.     Signed, BErma Heritage PA-C  06/19/2020 7:46 PM    [place]NewsomsS[address] [id number]: [phone]: [phone]

## 2020-06-20 ENCOUNTER — Telehealth: Payer: Self-pay

## 2020-06-20 ENCOUNTER — Telehealth: Payer: Self-pay | Admitting: Family Medicine

## 2020-06-20 DIAGNOSIS — Z79899 Other long term (current) drug therapy: Secondary | ICD-10-CM | POA: Diagnosis not present

## 2020-06-20 DIAGNOSIS — M159 Polyosteoarthritis, unspecified: Secondary | ICD-10-CM | POA: Diagnosis not present

## 2020-06-20 DIAGNOSIS — K5900 Constipation, unspecified: Secondary | ICD-10-CM | POA: Diagnosis not present

## 2020-06-20 DIAGNOSIS — H9190 Unspecified hearing loss, unspecified ear: Secondary | ICD-10-CM | POA: Diagnosis not present

## 2020-06-20 DIAGNOSIS — I4891 Unspecified atrial fibrillation: Secondary | ICD-10-CM | POA: Diagnosis not present

## 2020-06-20 DIAGNOSIS — M4854XD Collapsed vertebra, not elsewhere classified, thoracic region, subsequent encounter for fracture with routine healing: Secondary | ICD-10-CM | POA: Diagnosis not present

## 2020-06-20 DIAGNOSIS — F419 Anxiety disorder, unspecified: Secondary | ICD-10-CM | POA: Diagnosis not present

## 2020-06-20 DIAGNOSIS — I509 Heart failure, unspecified: Secondary | ICD-10-CM | POA: Diagnosis not present

## 2020-06-20 DIAGNOSIS — I110 Hypertensive heart disease with heart failure: Secondary | ICD-10-CM | POA: Diagnosis not present

## 2020-06-20 NOTE — Telephone Encounter (Signed)
Faxed FL2 form and attached documents to [facility] fax # [phone] - confirmation received.

## 2020-06-20 NOTE — Telephone Encounter (Signed)
I filled out the [facility] 2 form Please fax it to the appropriate place (Also we did discuss holding Ambien while on narcotics please put on the epic med list for this medication to be on hold thank you

## 2020-06-20 NOTE — Telephone Encounter (Signed)
Form filled out

## 2020-06-20 NOTE — Telephone Encounter (Signed)
Fl2 form in dr scott's folder

## 2020-06-20 NOTE — Telephone Encounter (Signed)
Arnette Schaumann called and wanted to check with [name] about the [facility] form for Ashanna?   Pt call back [phone]

## 2020-06-20 NOTE — Telephone Encounter (Signed)
Attempted to schedule AWV. Unable to LVM.  Will try at later time.   Called patient to schedule Annual Wellness Visit.  Please schedule with Nurse Health Advisor Shannon Crews, RN at [facility]  

## 2020-06-21 ENCOUNTER — Other Ambulatory Visit: Payer: Self-pay | Admitting: Family Medicine

## 2020-06-25 ENCOUNTER — Telehealth: Payer: Self-pay

## 2020-06-25 ENCOUNTER — Telehealth: Payer: Self-pay | Admitting: Cardiology

## 2020-06-25 NOTE — Telephone Encounter (Signed)
Robin (Patient's Daughter) called to let [place] prescribed her apixaban (ELIQUIS) 5 MG TABS tablet, changed . She is worried about her mother having another blood clot in her lung if she doesn't get [employer]  and insurance to cover. She doe not know what she needs to do. Please call her #[phone]. I see previous notes have been sent too.

## 2020-06-25 NOTE — Telephone Encounter (Signed)
Robin calling about Katherine Lane on apixaban (ELIQUIS) 5 MG TABS tablet  Needs approval from Dr per insurance   Pt call back [phone] [name]

## 2020-06-25 NOTE — Telephone Encounter (Signed)
Eliquis prior auth approved through 02/16/21. Confirmed pharmacy can process rx. Called to let pt's daughter know as well, she was very appreciative for assistance.

## 2020-06-25 NOTE — Telephone Encounter (Signed)
Christina w/ [employer] is calling - they're needing a prior auth for apixaban (ELIQUIS) 5 MG TABS tablet [[id number]]   [phone]- can call and answer 3 questions that need to be addressed in order for it to be processed

## 2020-06-25 NOTE — Telephone Encounter (Signed)
I will FYI Vashti Hey, RN

## 2020-06-25 NOTE — Telephone Encounter (Signed)
Form was faxed to [facility] office - prior authorization needed, states pt is on duplicate anticoagulation. Pt is not (was on Xarelto, admitted with PE last month, discharged on Eliquis instead). Called [employer] to clarify - they still have her taking Xarelto in their system. New expedited authorization completed over the phone. Decision will be made within 24 hours.  Spoke with pt's daughter. She states pt has 1 dose of Eliquis tonight but will be all out tomorrow. Cathy in [place] office will leave samples up front for pt so that she has no lapse in anticoagulation while appeals is pending.

## 2020-06-25 NOTE — Telephone Encounter (Signed)
Informed Eliquis has been prescribed by cardiology .

## 2020-06-26 ENCOUNTER — Telehealth: Payer: Self-pay | Admitting: *Deleted

## 2020-06-26 ENCOUNTER — Other Ambulatory Visit: Payer: Self-pay

## 2020-06-26 DIAGNOSIS — S22070D Wedge compression fracture of T9-T10 vertebra, subsequent encounter for fracture with routine healing: Secondary | ICD-10-CM | POA: Diagnosis not present

## 2020-06-26 DIAGNOSIS — K644 Residual hemorrhoidal skin tags: Secondary | ICD-10-CM

## 2020-06-26 DIAGNOSIS — M549 Dorsalgia, unspecified: Secondary | ICD-10-CM | POA: Diagnosis not present

## 2020-06-26 DIAGNOSIS — K6289 Other specified diseases of anus and rectum: Secondary | ICD-10-CM

## 2020-06-26 DIAGNOSIS — S22080G Wedge compression fracture of T11-T12 vertebra, subsequent encounter for fracture with delayed healing: Secondary | ICD-10-CM | POA: Diagnosis not present

## 2020-06-26 NOTE — Telephone Encounter (Signed)
Received a call from patient daughter requesting rectal cream (compounded through [employer]) reports Dr. Darrick Penna has prescribed this in the past.

## 2020-06-26 NOTE — Progress Notes (Signed)
Pt would like for her Hemorrhoid cream to be refilled

## 2020-06-27 ENCOUNTER — Other Ambulatory Visit: Payer: Self-pay

## 2020-06-27 NOTE — Telephone Encounter (Signed)
Please call in:  [facility] Hemorrhoid compounded cream #30grams, apply to rectum four times daily as needed for pain or bleeding.1 refill.  Same cream she has been getting from them.

## 2020-06-27 NOTE — Telephone Encounter (Signed)
Sent in Rx for [facility] Hemorrhoid Cream with 1 refill

## 2020-06-27 NOTE — Progress Notes (Signed)
See other note

## 2020-06-27 NOTE — Progress Notes (Signed)
error 

## 2020-06-28 ENCOUNTER — Other Ambulatory Visit: Payer: Self-pay | Admitting: Family Medicine

## 2020-07-02 NOTE — Telephone Encounter (Signed)
Last seen 06/06/20 for back pain

## 2020-07-03 ENCOUNTER — Telehealth: Payer: Self-pay

## 2020-07-03 ENCOUNTER — Encounter: Payer: Self-pay | Admitting: Family Medicine

## 2020-07-03 ENCOUNTER — Telehealth: Payer: Self-pay | Admitting: *Deleted

## 2020-07-03 ENCOUNTER — Other Ambulatory Visit (HOSPITAL_COMMUNITY)
Admission: RE | Admit: 2020-07-03 | Discharge: 2020-07-03 | Disposition: A | Discharge status: Home / Self Care | Payer: Medicare PPO | Source: Ambulatory Visit | Attending: Student | Admitting: Student

## 2020-07-03 ENCOUNTER — Other Ambulatory Visit: Payer: Self-pay

## 2020-07-03 ENCOUNTER — Telehealth: Payer: Self-pay | Admitting: Family Medicine

## 2020-07-03 DIAGNOSIS — Z79899 Other long term (current) drug therapy: Secondary | ICD-10-CM | POA: Diagnosis not present

## 2020-07-03 LAB — BASIC METABOLIC PANEL
Anion gap: 11 (ref 5–15)
BUN: 33 mg/dL — ABNORMAL HIGH (ref 8–23)
CO2: 27 mmol/L (ref 22–32)
Calcium: 8.8 mg/dL — ABNORMAL LOW (ref 8.9–10.3)
Chloride: 100 mmol/L (ref 98–111)
Creatinine, Ser: 1.59 mg/dL — ABNORMAL HIGH (ref 0.44–1.00)
GFR, Estimated: 32 mL/min — ABNORMAL LOW (ref >60)
Glucose, Bld: 125 mg/dL — ABNORMAL HIGH (ref 70–99)
Potassium: 4.1 mmol/L (ref 3.5–5.1)
Sodium: 138 mmol/L (ref 135–145)

## 2020-07-03 MED ORDER — FUROSEMIDE 40 MG PO TABS: 40.0000 mg | ORAL_TABLET | Freq: Two times a day (BID) | ORAL | 3 refills | Status: DC

## 2020-07-03 MED ORDER — FUROSEMIDE 40 MG PO TABS: 40.0000 mg | ORAL_TABLET | Freq: Every day | ORAL | 3 refills | Status: DC

## 2020-07-03 NOTE — Telephone Encounter (Signed)
-----   Message from Dyann Kief, PA-C sent at 07/03/2020  4:10 PM EDT ----- Kidney function  up 1.59. decrease lasix 40 mg bid. Repeat bmet in 1 week. May need to adjust eliquis if still up.

## 2020-07-03 NOTE — Telephone Encounter (Signed)
I spoke with daughter and gave her lab results. She agrees with plan to decrease Lasix to 40 mg BID (from 60 mg BID) and repeat bmet in 1 week.

## 2020-07-03 NOTE — Telephone Encounter (Signed)
Letter from [employer]. eliquis 5mg  tablet was approved and authorization is good until 02/16/21. Approval letter faxed to pharm and sent to be scanned and also left message to return call with pt's daughter robin to make sure she is aware med is approved.

## 2020-07-03 NOTE — Telephone Encounter (Signed)
Pt daughter returned call and verbalized understanding.  

## 2020-07-03 NOTE — Telephone Encounter (Signed)
Patient is moving to new adult care and daughter needs new FL2 form completed and the faculty is requesting that only one doctor fill out paperwork with all her medications on it from her different doctors.I told daughter I would send back message and ask. Please advise Katherine Lane [phone]

## 2020-07-03 NOTE — Telephone Encounter (Signed)
Daughter contacted. Pt will be placed at [facility] on 07/17/20. New FL 2 paper that is to be filled out-daughter will upload to MyChart or print it and bring it by office. Daughter states that the lady at [facility] told her that only on FL2 was to be sent; that the specialist should send all meds that they prescribe over to PCP and then PCP fax papers. Pt has lots of heart meds with Dr.McDowell and pain med with Dr.Sterns. the meds need to be correct due to [facility] having own pharmacy. Pt daughter [name] would like a call back to go over everything before sending form in. Please advise. Thank you.

## 2020-07-05 ENCOUNTER — Telehealth: Payer: Self-pay

## 2020-07-05 NOTE — Telephone Encounter (Signed)
Spoke with Robin. Went over med list. Pt daily med is Eliquis, Lasix (pt had lab work and Lasix was increased to 40 mg BID ), Losartan, Cardizem, metoprolol. Pain med-Hydromorphone- Epic has 2 mg ; [facility] filled 4 mg due to pain med doctor Dr.Stern giving directions of 1/2-1 tab every 6 hrs prn severe pain-daughter is cutting tab in half. Tripp states for legal purposes he had to fill 4 mg for directions written. Prn meds are Zofran(not on current list), Ativan, Miralax, Tylenol, Hemmoroid cream, eye drops, flonase, multivitamin, Florastor. Pt also had ambien on list but does not take it due to being on pain pills. Daughter states that if patient does not take pain med, she sometimes take Ativan. Daughter states PCP could leave off Metolazone-pt has never taken that and Robaxin-has it but does not help. Daughter states at this time she is leaving all doctors here and then will transition over once pt is settled. Pt would like a call back once the form is faxed. Daughter just wants to make sure all meds are correct with correct dosage. Please advise. Thank you

## 2020-07-05 NOTE — Telephone Encounter (Signed)
Katherine Lane is calling about Katherine Lane following up with Katherine Lane about FL2 forms   Pt call [phone]

## 2020-07-05 NOTE — Telephone Encounter (Signed)
,  Time was spent filling this out please review feel free to fax the form as requested and notify [name] and fax the med list thank you

## 2020-07-05 NOTE — Telephone Encounter (Signed)
Form faxed to [facility] and pt daughter is aware. Daughter would like a copy of form. Form up front for pick up

## 2020-07-06 ENCOUNTER — Telehealth: Payer: Self-pay | Admitting: Student

## 2020-07-06 DIAGNOSIS — I4892 Unspecified atrial flutter: Secondary | ICD-10-CM

## 2020-07-06 NOTE — Telephone Encounter (Signed)
Sherrie -daughter called. She is wanting to see if [place] will order more labs on patient before her next appt. [phone].

## 2020-07-06 NOTE — Telephone Encounter (Signed)
Daughter concerned that since pt has started Eliquis, she has been having on and off nose bleeds. Pt's daughter would like to add a CBC to lab work that pt is already having done on Tuesday before her appt with B. Strader, PA-C on Wednesday 07/11/20. Daughter also stated that pt is going full time to [facility] and she is not sure when she will get lab work done again. Please advise.

## 2020-07-07 NOTE — Telephone Encounter (Signed)
Tanya I feel like I have already filled this out as good as I can and do not need to handle this issue again unless needed I filled out the medicine list which is what epic states which reflects what the specialist does   So please go ahead with the following: #1-please review the medicine list to make sure it is accurate #2 reviewed this with the daughter #3 if there is any corrections that need to be made by myself then bring this back up to me #4 otherwise please go ahead and finish this issue/review it with Robin/hopefully at that point it is completed to everybody satisfaction  If I need to make any edits please bring these directly to me to handle in real-time Thank you for your efforts-Dr. Lorin Picket

## 2020-07-08 NOTE — Telephone Encounter (Signed)
     Yes, can add a CBC to her upcoming labs for medication management. Would not hold Eliquis given her recent Pulmonary Embolism and if epistaxis continues to be an issue, may need to see ENT for cauterization.    Thanks,  [place]

## 2020-07-09 NOTE — Addendum Note (Signed)
Addended by: Kerney Elbe on: 07/09/2020 08:18 AM   Modules accepted: Orders

## 2020-07-09 NOTE — Telephone Encounter (Signed)
Nurses Last week I finished this form I did try to call the daughter got no answer I assume that all of this has been forwarded as requested last week May close this message

## 2020-07-09 NOTE — Telephone Encounter (Signed)
Daughter Dionicia Abler notified and voiced understanding.

## 2020-07-09 NOTE — Telephone Encounter (Signed)
Medications were reviewed with daughter via phone last weeks. ( See message in chart). Form has been faxed (received confirmation) and pt daughter came by office to pick up copy.

## 2020-07-10 ENCOUNTER — Other Ambulatory Visit: Payer: Self-pay

## 2020-07-10 ENCOUNTER — Other Ambulatory Visit (HOSPITAL_COMMUNITY)
Admission: RE | Admit: 2020-07-10 | Discharge: 2020-07-10 | Disposition: A | Discharge status: Home / Self Care | Payer: Medicare PPO | Source: Ambulatory Visit | Attending: Student | Admitting: Student

## 2020-07-10 DIAGNOSIS — R54 Age-related physical debility: Secondary | ICD-10-CM | POA: Diagnosis present

## 2020-07-10 DIAGNOSIS — I509 Heart failure, unspecified: Secondary | ICD-10-CM | POA: Diagnosis not present

## 2020-07-10 DIAGNOSIS — I482 Chronic atrial fibrillation, unspecified: Secondary | ICD-10-CM | POA: Diagnosis not present

## 2020-07-10 DIAGNOSIS — R0603 Acute respiratory distress: Secondary | ICD-10-CM | POA: Diagnosis not present

## 2020-07-10 DIAGNOSIS — I2609 Other pulmonary embolism with acute cor pulmonale: Secondary | ICD-10-CM | POA: Diagnosis not present

## 2020-07-10 DIAGNOSIS — I130 Hypertensive heart and chronic kidney disease with heart failure and stage 1 through stage 4 chronic kidney disease, or unspecified chronic kidney disease: Secondary | ICD-10-CM | POA: Diagnosis present

## 2020-07-10 DIAGNOSIS — Z888 Allergy status to other drugs, medicaments and biological substances status: Secondary | ICD-10-CM | POA: Diagnosis not present

## 2020-07-10 DIAGNOSIS — Z6821 Body mass index (BMI) 21.0-21.9, adult: Secondary | ICD-10-CM | POA: Diagnosis not present

## 2020-07-10 DIAGNOSIS — I445 Left posterior fascicular block: Secondary | ICD-10-CM | POA: Diagnosis present

## 2020-07-10 DIAGNOSIS — Z9109 Other allergy status, other than to drugs and biological substances: Secondary | ICD-10-CM | POA: Diagnosis not present

## 2020-07-10 DIAGNOSIS — N1832 Chronic kidney disease, stage 3b: Secondary | ICD-10-CM | POA: Diagnosis present

## 2020-07-10 DIAGNOSIS — J189 Pneumonia, unspecified organism: Secondary | ICD-10-CM | POA: Diagnosis not present

## 2020-07-10 DIAGNOSIS — Z20822 Contact with and (suspected) exposure to covid-19: Secondary | ICD-10-CM | POA: Diagnosis present

## 2020-07-10 DIAGNOSIS — Z86711 Personal history of pulmonary embolism: Secondary | ICD-10-CM | POA: Diagnosis not present

## 2020-07-10 DIAGNOSIS — Z66 Do not resuscitate: Secondary | ICD-10-CM | POA: Diagnosis present

## 2020-07-10 DIAGNOSIS — R0602 Shortness of breath: Secondary | ICD-10-CM | POA: Diagnosis not present

## 2020-07-10 DIAGNOSIS — I5033 Acute on chronic diastolic (congestive) heart failure: Secondary | ICD-10-CM | POA: Diagnosis present

## 2020-07-10 DIAGNOSIS — I420 Dilated cardiomyopathy: Secondary | ICD-10-CM | POA: Diagnosis present

## 2020-07-10 DIAGNOSIS — I517 Cardiomegaly: Secondary | ICD-10-CM | POA: Diagnosis not present

## 2020-07-10 DIAGNOSIS — I110 Hypertensive heart disease with heart failure: Secondary | ICD-10-CM | POA: Diagnosis not present

## 2020-07-10 DIAGNOSIS — I4821 Permanent atrial fibrillation: Secondary | ICD-10-CM | POA: Diagnosis present

## 2020-07-10 DIAGNOSIS — G8929 Other chronic pain: Secondary | ICD-10-CM | POA: Diagnosis present

## 2020-07-10 DIAGNOSIS — R0600 Dyspnea, unspecified: Secondary | ICD-10-CM | POA: Diagnosis present

## 2020-07-10 DIAGNOSIS — Z86718 Personal history of other venous thrombosis and embolism: Secondary | ICD-10-CM | POA: Diagnosis not present

## 2020-07-10 DIAGNOSIS — K649 Unspecified hemorrhoids: Secondary | ICD-10-CM | POA: Diagnosis present

## 2020-07-10 DIAGNOSIS — M549 Dorsalgia, unspecified: Secondary | ICD-10-CM | POA: Diagnosis present

## 2020-07-10 DIAGNOSIS — I8290 Acute embolism and thrombosis of unspecified vein: Secondary | ICD-10-CM | POA: Diagnosis not present

## 2020-07-10 DIAGNOSIS — I4892 Unspecified atrial flutter: Secondary | ICD-10-CM

## 2020-07-10 DIAGNOSIS — Z885 Allergy status to narcotic agent status: Secondary | ICD-10-CM | POA: Diagnosis not present

## 2020-07-10 DIAGNOSIS — R197 Diarrhea, unspecified: Secondary | ICD-10-CM | POA: Diagnosis present

## 2020-07-10 DIAGNOSIS — J9601 Acute respiratory failure with hypoxia: Secondary | ICD-10-CM | POA: Diagnosis not present

## 2020-07-10 DIAGNOSIS — Z79899 Other long term (current) drug therapy: Secondary | ICD-10-CM | POA: Diagnosis not present

## 2020-07-10 DIAGNOSIS — Z7901 Long term (current) use of anticoagulants: Secondary | ICD-10-CM | POA: Diagnosis not present

## 2020-07-10 LAB — CBC WITH DIFFERENTIAL/PLATELET
Abs Immature Granulocytes: 0.05 10*3/uL (ref 0.00–0.07)
Basophils Absolute: 0.1 10*3/uL (ref 0.0–0.1)
Basophils Relative: 1 %
Eosinophils Absolute: 0.2 10*3/uL (ref 0.0–0.5)
Eosinophils Relative: 3 %
HCT: 40.2 % (ref 36.0–46.0)
Hemoglobin: 11.8 g/dL — ABNORMAL LOW (ref 12.0–15.0)
Immature Granulocytes: 1 %
Lymphocytes Relative: 21 %
Lymphs Abs: 1.4 10*3/uL (ref 0.7–4.0)
MCH: 26.3 pg (ref 26.0–34.0)
MCHC: 29.4 g/dL — ABNORMAL LOW (ref 30.0–36.0)
MCV: 89.5 fL (ref 80.0–100.0)
Monocytes Absolute: 1 10*3/uL (ref 0.1–1.0)
Monocytes Relative: 14 %
Neutro Abs: 4.2 10*3/uL (ref 1.7–7.7)
Neutrophils Relative %: 60 %
Platelets: 283 10*3/uL (ref 150–400)
RBC: 4.49 MIL/uL (ref 3.87–5.11)
RDW: 23.6 % — ABNORMAL HIGH (ref 11.5–15.5)
WBC: 6.9 10*3/uL (ref 4.0–10.5)
nRBC: 0.3 % — ABNORMAL HIGH (ref 0.0–0.2)

## 2020-07-11 ENCOUNTER — Emergency Department (HOSPITAL_COMMUNITY): Payer: Medicare PPO

## 2020-07-11 ENCOUNTER — Encounter (HOSPITAL_COMMUNITY): Payer: Self-pay

## 2020-07-11 ENCOUNTER — Other Ambulatory Visit (HOSPITAL_COMMUNITY): Admission: RE | Admit: 2020-07-11 | Payer: Medicare PPO | Source: Ambulatory Visit | Admitting: *Deleted

## 2020-07-11 ENCOUNTER — Other Ambulatory Visit: Payer: Self-pay

## 2020-07-11 ENCOUNTER — Inpatient Hospital Stay (HOSPITAL_COMMUNITY)
Admission: EM | Admit: 2020-07-11 | Discharge: 2020-07-16 | DRG: 291 | Disposition: A | Discharge status: Hospice | Payer: Medicare PPO | Source: Skilled Nursing Facility | Attending: Family Medicine | Admitting: Family Medicine

## 2020-07-11 ENCOUNTER — Ambulatory Visit: Payer: Medicare PPO | Admitting: Student

## 2020-07-11 DIAGNOSIS — R0600 Dyspnea, unspecified: Secondary | ICD-10-CM | POA: Diagnosis present

## 2020-07-11 DIAGNOSIS — I5033 Acute on chronic diastolic (congestive) heart failure: Secondary | ICD-10-CM | POA: Diagnosis present

## 2020-07-11 DIAGNOSIS — Z885 Allergy status to narcotic agent status: Secondary | ICD-10-CM

## 2020-07-11 DIAGNOSIS — Z86711 Personal history of pulmonary embolism: Secondary | ICD-10-CM

## 2020-07-11 DIAGNOSIS — R197 Diarrhea, unspecified: Secondary | ICD-10-CM | POA: Diagnosis present

## 2020-07-11 DIAGNOSIS — G8929 Other chronic pain: Secondary | ICD-10-CM | POA: Diagnosis present

## 2020-07-11 DIAGNOSIS — N183 Chronic kidney disease, stage 3 unspecified: Secondary | ICD-10-CM | POA: Diagnosis present

## 2020-07-11 DIAGNOSIS — I2699 Other pulmonary embolism without acute cor pulmonale: Secondary | ICD-10-CM | POA: Diagnosis present

## 2020-07-11 DIAGNOSIS — I4821 Permanent atrial fibrillation: Secondary | ICD-10-CM | POA: Diagnosis present

## 2020-07-11 DIAGNOSIS — J9601 Acute respiratory failure with hypoxia: Secondary | ICD-10-CM | POA: Diagnosis present

## 2020-07-11 DIAGNOSIS — R0603 Acute respiratory distress: Secondary | ICD-10-CM | POA: Diagnosis not present

## 2020-07-11 DIAGNOSIS — K649 Unspecified hemorrhoids: Secondary | ICD-10-CM | POA: Diagnosis present

## 2020-07-11 DIAGNOSIS — I420 Dilated cardiomyopathy: Secondary | ICD-10-CM | POA: Diagnosis present

## 2020-07-11 DIAGNOSIS — I482 Chronic atrial fibrillation, unspecified: Secondary | ICD-10-CM | POA: Diagnosis not present

## 2020-07-11 DIAGNOSIS — I8290 Acute embolism and thrombosis of unspecified vein: Secondary | ICD-10-CM

## 2020-07-11 DIAGNOSIS — N1832 Chronic kidney disease, stage 3b: Secondary | ICD-10-CM | POA: Diagnosis present

## 2020-07-11 DIAGNOSIS — Z7901 Long term (current) use of anticoagulants: Secondary | ICD-10-CM

## 2020-07-11 DIAGNOSIS — J189 Pneumonia, unspecified organism: Secondary | ICD-10-CM | POA: Diagnosis not present

## 2020-07-11 DIAGNOSIS — R54 Age-related physical debility: Secondary | ICD-10-CM | POA: Diagnosis present

## 2020-07-11 DIAGNOSIS — Z86718 Personal history of other venous thrombosis and embolism: Secondary | ICD-10-CM

## 2020-07-11 DIAGNOSIS — I130 Hypertensive heart and chronic kidney disease with heart failure and stage 1 through stage 4 chronic kidney disease, or unspecified chronic kidney disease: Principal | ICD-10-CM | POA: Diagnosis present

## 2020-07-11 DIAGNOSIS — I445 Left posterior fascicular block: Secondary | ICD-10-CM | POA: Diagnosis present

## 2020-07-11 DIAGNOSIS — Z6821 Body mass index (BMI) 21.0-21.9, adult: Secondary | ICD-10-CM

## 2020-07-11 DIAGNOSIS — M549 Dorsalgia, unspecified: Secondary | ICD-10-CM | POA: Diagnosis present

## 2020-07-11 DIAGNOSIS — Z9109 Other allergy status, other than to drugs and biological substances: Secondary | ICD-10-CM

## 2020-07-11 DIAGNOSIS — Z66 Do not resuscitate: Secondary | ICD-10-CM | POA: Diagnosis present

## 2020-07-11 DIAGNOSIS — Z20822 Contact with and (suspected) exposure to covid-19: Secondary | ICD-10-CM | POA: Diagnosis present

## 2020-07-11 DIAGNOSIS — Z79899 Other long term (current) drug therapy: Secondary | ICD-10-CM

## 2020-07-11 DIAGNOSIS — I509 Heart failure, unspecified: Secondary | ICD-10-CM

## 2020-07-11 DIAGNOSIS — Z888 Allergy status to other drugs, medicaments and biological substances status: Secondary | ICD-10-CM

## 2020-07-11 LAB — BASIC METABOLIC PANEL
Anion gap: 10 (ref 5–15)
BUN: 26 mg/dL — ABNORMAL HIGH (ref 8–23)
CO2: 23 mmol/L (ref 22–32)
Calcium: 8.9 mg/dL (ref 8.9–10.3)
Chloride: 105 mmol/L (ref 98–111)
Creatinine, Ser: 1.22 mg/dL — ABNORMAL HIGH (ref 0.44–1.00)
GFR, Estimated: 43 mL/min — ABNORMAL LOW (ref >60)
Glucose, Bld: 151 mg/dL — ABNORMAL HIGH (ref 70–99)
Potassium: 4 mmol/L (ref 3.5–5.1)
Sodium: 138 mmol/L (ref 135–145)

## 2020-07-11 LAB — CBC
HCT: 41.8 % (ref 36.0–46.0)
Hemoglobin: 12.4 g/dL (ref 12.0–15.0)
MCH: 26.7 pg (ref 26.0–34.0)
MCHC: 29.7 g/dL — ABNORMAL LOW (ref 30.0–36.0)
MCV: 89.9 fL (ref 80.0–100.0)
Platelets: 334 10*3/uL (ref 150–400)
RBC: 4.65 MIL/uL (ref 3.87–5.11)
RDW: 24 % — ABNORMAL HIGH (ref 11.5–15.5)
WBC: 5.8 10*3/uL (ref 4.0–10.5)
nRBC: 0 % (ref 0.0–0.2)

## 2020-07-11 LAB — TROPONIN I (HIGH SENSITIVITY): Troponin I (High Sensitivity): 22 ng/L — ABNORMAL HIGH (ref <18)

## 2020-07-11 LAB — BRAIN NATRIURETIC PEPTIDE: B Natriuretic Peptide: 2601 pg/mL — ABNORMAL HIGH (ref 0.0–100.0)

## 2020-07-11 MED ORDER — SODIUM CHLORIDE 0.9 % IV SOLN
500.0000 mg | INTRAVENOUS | Status: DC
  Administered 2020-07-11 – 2020-07-12 (×2): 500 mg via INTRAVENOUS
  Filled 2020-07-11 (×2): qty 500

## 2020-07-11 MED ORDER — METOPROLOL SUCCINATE ER 50 MG PO TB24
100.0000 mg | ORAL_TABLET | Freq: Every day | ORAL | Status: DC
  Administered 2020-07-12 – 2020-07-16 (×5): 100 mg via ORAL
  Filled 2020-07-11 (×5): qty 2

## 2020-07-11 MED ORDER — ZOLPIDEM TARTRATE 5 MG PO TABS
5.0000 mg | ORAL_TABLET | Freq: Every evening | ORAL | Status: DC | PRN
  Administered 2020-07-11: 5 mg via ORAL
  Filled 2020-07-11: qty 1

## 2020-07-11 MED ORDER — SODIUM CHLORIDE 0.9 % IV SOLN
100.0000 mg | Freq: Once | INTRAVENOUS | Status: AC
  Administered 2020-07-11: 100 mg via INTRAVENOUS
  Filled 2020-07-11: qty 100

## 2020-07-11 MED ORDER — LOSARTAN POTASSIUM 50 MG PO TABS
25.0000 mg | ORAL_TABLET | Freq: Every day | ORAL | Status: DC
  Administered 2020-07-12 – 2020-07-16 (×5): 25 mg via ORAL
  Filled 2020-07-11 (×5): qty 1

## 2020-07-11 MED ORDER — HYDROMORPHONE HCL 2 MG PO TABS
2.0000 mg | ORAL_TABLET | Freq: Four times a day (QID) | ORAL | Status: DC | PRN
  Administered 2020-07-11 – 2020-07-16 (×15): 2 mg via ORAL
  Filled 2020-07-11 (×15): qty 1

## 2020-07-11 MED ORDER — FUROSEMIDE 10 MG/ML IJ SOLN
40.0000 mg | Freq: Two times a day (BID) | INTRAMUSCULAR | Status: DC
  Administered 2020-07-11 – 2020-07-12 (×2): 40 mg via INTRAVENOUS
  Filled 2020-07-11 (×2): qty 4

## 2020-07-11 MED ORDER — ACETAMINOPHEN 650 MG RE SUPP: 650.0000 mg | Freq: Four times a day (QID) | RECTAL | Status: DC | PRN

## 2020-07-11 MED ORDER — POTASSIUM CHLORIDE CRYS ER 20 MEQ PO TBCR
20.0000 meq | EXTENDED_RELEASE_TABLET | Freq: Every day | ORAL | Status: DC
  Administered 2020-07-12 – 2020-07-16 (×5): 20 meq via ORAL
  Filled 2020-07-11: qty 1
  Filled 2020-07-11: qty 2
  Filled 2020-07-11 (×2): qty 1
  Filled 2020-07-11 (×2): qty 2
  Filled 2020-07-11: qty 1

## 2020-07-11 MED ORDER — SODIUM CHLORIDE 0.9 % IV SOLN
1.0000 g | INTRAVENOUS | Status: DC
  Administered 2020-07-12: 1 g via INTRAVENOUS
  Filled 2020-07-11: qty 10

## 2020-07-11 MED ORDER — LORAZEPAM 0.5 MG PO TABS
0.5000 mg | ORAL_TABLET | Freq: Two times a day (BID) | ORAL | Status: DC | PRN
  Administered 2020-07-11 – 2020-07-16 (×10): 0.5 mg via ORAL
  Filled 2020-07-11 (×10): qty 1

## 2020-07-11 MED ORDER — POTASSIUM CHLORIDE CRYS ER 10 MEQ PO TBCR
10.0000 meq | EXTENDED_RELEASE_TABLET | Freq: Every day | ORAL | Status: DC
  Administered 2020-07-11 – 2020-07-15 (×5): 10 meq via ORAL
  Filled 2020-07-11 (×5): qty 1

## 2020-07-11 MED ORDER — DILTIAZEM HCL ER COATED BEADS 120 MG PO CP24
120.0000 mg | ORAL_CAPSULE | Freq: Every day | ORAL | Status: DC
  Administered 2020-07-12: 120 mg via ORAL
  Filled 2020-07-11: qty 1

## 2020-07-11 MED ORDER — FUROSEMIDE 10 MG/ML IJ SOLN
40.0000 mg | Freq: Once | INTRAMUSCULAR | Status: AC
  Administered 2020-07-11: 40 mg via INTRAVENOUS
  Filled 2020-07-11 (×2): qty 4

## 2020-07-11 MED ORDER — SODIUM CHLORIDE 0.9 % IV SOLN
1.0000 g | Freq: Once | INTRAVENOUS | Status: AC
  Administered 2020-07-11: 1 g via INTRAVENOUS
  Filled 2020-07-11: qty 10

## 2020-07-11 MED ORDER — ALBUTEROL SULFATE (2.5 MG/3ML) 0.083% IN NEBU: 2.5000 mg | INHALATION_SOLUTION | RESPIRATORY_TRACT | Status: DC | PRN

## 2020-07-11 MED ORDER — APIXABAN 5 MG PO TABS
5.0000 mg | ORAL_TABLET | Freq: Two times a day (BID) | ORAL | Status: DC
  Administered 2020-07-11 – 2020-07-16 (×10): 5 mg via ORAL
  Filled 2020-07-11 (×9): qty 1

## 2020-07-11 MED ORDER — FUROSEMIDE 10 MG/ML IJ SOLN: 40.0000 mg | Freq: Two times a day (BID) | INTRAMUSCULAR | Status: DC

## 2020-07-11 MED ORDER — ACETAMINOPHEN 325 MG PO TABS
650.0000 mg | ORAL_TABLET | Freq: Four times a day (QID) | ORAL | Status: DC | PRN
  Filled 2020-07-11 (×2): qty 2

## 2020-07-11 NOTE — H&P (Addendum)
TRH H&P   Patient Demographics:    Katherine Lane, is a 87 y.o. female  MRN: [id number]   DOB - 1932/09/10  Admit Date - 07/11/2020  Outpatient Primary MD for the patient is Kathyrn Drown, MD  Referring MD/NP/PA: Darden Dates  Outpatient Specialists: cardiology Dr Domenic Polite    Patient coming from: home, but patient will be moving to [facility] [place] next week to become   Permanent resident at [facility].  Chief Complaint  Patient presents with  . Shortness of Breath      HPI:    Katherine Lane  is a 87 y.o. female, with past medical history of chronic diastolic CHF, permanent atrial fibrillation, HTN,chronic back painand recently diagnosed PE/DVT , patient presents ED secondary to complaints of shortness of breath, patient was seen at cardiologist office yesterday, noted to have increased creatinine from 1.1-1.59, so her Lasix was decreased from 60 mg twice daily to 40 mg twice daily, he was sent by cardiology today for IV diuresis due to volume overload, did not receive the COVID booster vaccines on Saturday morning, reports she has been feeling achy, with some cough and feverish until Sunday evening, but report over the last 3 days, she has been having progressive dyspnea, unable to lay flat at baseline due to her chronic back pain, does report dyspnea, denies any recent fever, chills or cough, she denies any chest pain, nausea or vomiting, she had diarrhea a few days ago but nothing recent. - in ED work-up was significant for creatinine at 1.3, significantly elevated BNP at 2100, fever, no leukocytosis, chest x-ray significant for, may represent pneumonia, small right effusion as well, cardiomegaly, [facility] consulted to admit.    Review of systems:    In addition to the HPI above,  No Fever-chills, poor generalized weakness and fatigue No Headache, No changes with Vision  or hearing, No problems swallowing food or Liquids, No Chest pain, Cough, she does complain of shortness of breath  no Abdominal pain, No Nausea or Vommitting, + diarrhea recently, but nothing over last couple days No Blood in stool or Urine, No dysuria, No new skin rashes or bruises, No new joints pains-aches,  No new weakness, tingling, numbness in any extremity, No recent weight gain or loss, No polyuria, polydypsia or polyphagia, No significant Mental Stressors.  A full 10 point Review of Systems was done, except as stated above, all other Review of Systems were negative.   With Past History of the following :    Past Medical History:  Diagnosis Date  . Anxiety   . Arthritis   . Atrial fibrillation ([place])   . CHF (congestive heart failure) ([place])   . Chronic back pain   . Complication of anesthesia   . Constipation   . Depression   . Diastolic heart failure ([place])   . Dyspnea   . Dysrhythmia  AFib  . Essential hypertension   . History of blood transfusion   . History of cervical fracture   . Insomnia   . Migraine headache   . Osteoporosis   . Pneumonia   . PONV (postoperative nausea and vomiting)    "patch helped"  . Trigeminal neuralgia   . 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[facility])       Past Surgical History:  Procedure Laterality Date  . ANKLE FRACTURE SURGERY Right   . BACK SURGERY  2022  . CARDIAC CATHETERIZATION     denies  . CATARACT EXTRACTION W/PHACO Right 07/22/2012   Procedure: CATARACT EXTRACTION PHACO AND INTRAOCULAR LENS PLACEMENT (IOC);  Surgeon: Tonny Branch, MD;  Location: [facility];  Service: Ophthalmology;  Laterality: Right;  CDE:  18.93  . CATARACT EXTRACTION W/PHACO Left 08/09/2012   Procedure: CATARACT EXTRACTION PHACO AND INTRAOCULAR LENS PLACEMENT (IOC);  Surgeon: Tonny Branch, MD;  Location: [facility];  Service: Ophthalmology;  Laterality: Left;  CDE: 17.21  . COLONOSCOPY    . FLEXIBLE SIGMOIDOSCOPY N/A 11/19/2018   Procedure: FLEXIBLE SIGMOIDOSCOPY;   Surgeon: Danie Binder, MD;  Location: [facility];  Service: Endoscopy;  Laterality: N/A;  7:30am  . HARDWARE REMOVAL  07/18/2011   Procedure: HARDWARE REMOVAL; Right leg-  Surgeon: Carole Civil, MD;  Location: [facility];  Service: Orthopedics;  Laterality: Right;  . HEMORRHOID BANDING N/A 11/19/2018   Procedure: HEMORRHOID BANDING;  Surgeon: Danie Binder, MD;  [facility];  Service: Endoscopy;  Laterality: N/A;  . KYPHOPLASTY N/A 12/26/2016   Procedure: Lumbar two Lumbar three and Lumbar five KYPHOPLASTY;  Surgeon: Erline Levine, MD;  Location: [place];  Service: Neurosurgery;  Laterality: N/A;  . KYPHOPLASTY N/A 02/19/2017   Procedure: KYPHOPLASTY LUMBAR ONE;  Surgeon: Erline Levine, MD;  Location: [place];  Service: Neurosurgery;  Laterality: N/A;  KYPHOPLASTY LUMBAR ONE  . KYPHOPLASTY N/A 04/20/2020   Procedure: Thoracic eleven, Thoracic twelve Kyphoplasty;  Surgeon: Erline Levine, MD;  Location: [place];  Service: Neurosurgery;  Laterality: N/A;  . KYPHOPLASTY N/A 05/15/2020   Procedure: THORACIC NINE, THORACIC TEN  KYPHOPLASTY;  Surgeon: Erline Levine, MD;  Location: [place];  Service: Neurosurgery;  Laterality: N/A;  . NERVE REPAIR  07/18/2011   Procedure: NERVE REPAIR;  Surgeon: Carole Civil, MD;  Location: [facility];  Service: Orthopedics;  Laterality: Right;  Right leg superficial peroneal nerve release    . PARTIAL HYSTERECTOMY        Social History:     Social History   Tobacco Use  . Smoking status: Never Smoker  . Smokeless tobacco: Never Used  Substance Use Topics  . Alcohol use: No    Alcohol/week: 0.0 standard drinks      Family History :     Family History  Problem Relation Age of Onset  . Hypertension Mother   . Alzheimer's disease Mother   . Cancer Mother   . Hypertension Father   . Other Father        abused pain meds  . Diabetes Brother   . Other Brother        back problems  . Diabetes Daughter   . Anesthesia problems Neg Hx   .  Hypotension Neg Hx   . Malignant hyperthermia Neg Hx   . Pseudochol deficiency Neg Hx     Home Medications:   Prior to Admission medications   Medication Sig Start Date End Date Taking? Authorizing Provider  acetaminophen (TYLENOL) 325 MG tablet  Take 2 tablets (650 mg total) by mouth every 4 (four) hours as needed for mild pain ((score 1 to 3) or temp > 100.5). 04/21/20  Yes Consuella Lose, MD  apixaban (ELIQUIS) 5 MG TABS tablet Take 5 mg by mouth 2 (two) times daily.   Yes [provider]  diltiazem (CARDIZEM CD) 120 MG 24 hr capsule TAKE (1) CAPSULE BY MOUTH ONCE DAILY. Patient taking differently: Take 120 mg by mouth daily. 12/27/19  Yes Satira Sark, MD  furosemide (LASIX) 40 MG tablet Take 1 tablet (40 mg total) by mouth 2 (two) times daily. 07/03/20 10/01/20 Yes Imogene Burn, PA-C  hydrocortisone (ANUSOL-HC) 2.5 % rectal cream APPLY RECTALLY THREE TIMES DAILY AS DIRECTED Patient taking differently: Place 1 application rectally 2 (two) times daily as needed for hemorrhoids or anal itching. 10/19/18  Yes Mikey Kirschner, MD  HYDROmorphone (DILAUDID) 2 MG tablet Take 2-3 mg by mouth every 6 (six) hours as needed for pain. 05/08/20  Yes [provider]  LORazepam (ATIVAN) 0.5 MG tablet Half tablet to one twice daily as needed for anxiety utilize lower dose as much as possible due to narcotics 06/22/20  Yes Luking, Elayne Snare, MD  losartan (COZAAR) 50 MG tablet Take 0.5 tablets (25 mg total) by mouth daily. Hold 06/01/20 08/30/20 Yes Barton Dubois, MD  metoprolol succinate (TOPROL-XL) 100 MG 24 hr tablet TAKE ONE TABLET BY MOUTH ONCE DAILY. Patient taking differently: Take 100 mg by mouth daily. 03/29/20  Yes Satira Sark, MD  Polyethyl Glycol-Propyl Glycol (LUBRICANT EYE DROPS) 0.4-0.3 % SOLN Place 1 drop into both eyes at bedtime.   Yes [provider]  polyethylene glycol powder (GLYCOLAX/MIRALAX) 17 GM/SCOOP powder Take 17 g by mouth daily as needed for  severe constipation. 06/01/20  Yes Barton Dubois, MD  potassium chloride (KLOR-CON) 10 MEQ tablet Take 2 tablets (20 mEq total) by mouth 2 (two) times daily. Take 20 meq am and 10 mg pm 06/01/20  Yes Barton Dubois, MD  shark liver oil-cocoa butter (PREPARATION H) 0.25-3-85.5 % suppository Place 1 suppository rectally 2 (two) times daily as needed for hemorrhoids.    Yes [provider]  zolpidem (AMBIEN) 5 MG tablet Take 1 tablet (5 mg total) by mouth at bedtime as needed for sleep. 07/02/20  Yes Kathyrn Drown, MD  apixaban (ELIQUIS) 5 MG TABS tablet Take 1 tablet (5 mg total) by mouth 2 (two) times daily. 06/19/20   Strader, Fransisco Hertz, PA-C  diltiazem (CARDIZEM) 30 MG tablet Take by mouth.    [provider]  fluticasone (FLONASE) 50 MCG/ACT nasal spray Place 2 sprays into both nostrils daily. Patient taking differently: Place 2 sprays into both nostrils daily as needed (ears stopped it). 12/20/19   Kathyrn Drown, MD  Lidocaine-Hydrocortisone Ace 3-2.5 % KIT APPLY TO RECTUM QID AS NEEDED FOR RECTAL PAIN OR BLEEDING Patient taking differently: Place 1 application rectally daily as needed (rectal pain or bleeding). 05/12/19   Fields, Marga Melnick, MD  methocarbamol (ROBAXIN) 500 MG tablet Take 1 tablet (500 mg total) by mouth every 8 (eight) hours as needed for muscle spasms. Patient not taking: Reported on 07/11/2020 04/21/20   Consuella Lose, MD  metolazone (ZAROXOLYN) 2.5 MG tablet Take 1 tablet (2.5 mg total) by mouth as needed (Take for weight gain > 2 lbs overnight or > 5 lbs in one week.). Patient not taking: No sig reported 04/12/20 07/11/20  Erma Heritage, PA-C  Multiple Vitamin (MULTIVITAMIN WITH MINERALS)  TABS tablet Take 1 tablet by mouth daily. Centrum Silver Patient not taking: Reported on 07/11/2020    [provider]  potassium chloride (KLOR-CON) 10 MEQ tablet Take by mouth. Patient not taking: No sig reported 06/01/20   [provider]   saccharomyces boulardii (FLORASTOR) 250 MG capsule Take 1 capsule (250 mg total) by mouth 2 (two) times daily. Patient not taking: No sig reported 06/01/20   Barton Dubois, MD     Allergies:     Allergies  Allergen Reactions  . Amlodipine Swelling    Leg swelling   . Codeine Nausea And Vomiting and Other (See Comments)    Patient states "intolerance to all pain medications"  (NO OPIOIDS) VERY VIOLENT VOMITING!!  . Other Nausea And Vomiting and Other (See Comments)    general anesthesia  . Tramadol Nausea And Vomiting and Other (See Comments)    "NO OPIOIDS!!! VERY VIOLENT VOMITING!!" per Med History prior to 02/18/17     Physical Exam:   Vitals  Blood pressure (!) 131/101, pulse 80, temperature 97.7 F (36.5 C), temperature source Oral, resp. rate (!) 21, height 5' (1.524 m), weight 49.9 kg, SpO2 97 %.   1. General elderly female, laying in bed, no apparent distress  2. Normal affect and insight, Not Suicidal or Homicidal, Awake Alert, Oriented X 3.  3. No F.N deficits, ALL C.Nerves Intact, Strength 5/5 all 4 extremities, Sensation intact all 4 extremities, Plantars down going.  4. Ears and Eyes appear Normal, Conjunctivae clear, PERRLA. Moist Oral Mucosa.  5. Supple Neck, positive JVD, No cervical lymphadenopathy appriciated, No Carotid Bruits.+ 1 lower extremity edema  6. Symmetrical Chest wall movement, Good air movement bilaterally, bibasilar crackles  7.  Irr Irr, No Gallops, Rubs or Murmurs, No Parasternal Heave.  8. Positive Bowel Sounds, Abdomen Soft, No tenderness, No organomegaly appriciated,No rebound -guarding or rigidity.  9.  No Cyanosis, Normal Skin Turgor, No Skin Rash or Bruise.  10. Good muscle tone,  joints appear normal , no effusions, Normal ROM.  11. No Palpable Lymph Nodes in Neck or Axillae     Data Review:    CBC Recent Labs  Lab 07/10/20 1119 07/11/20 1242  WBC 6.9 5.8  HGB 11.8* 12.4  HCT 40.2 41.8  PLT 283 334  MCV 89.5 89.9   MCH 26.3 26.7  MCHC 29.4* 29.7*  RDW 23.6* 24.0*  LYMPHSABS 1.4  --   MONOABS 1.0  --   EOSABS 0.2  --   BASOSABS 0.1  --    ------------------------------------------------------------------------------------------------------------------  Chemistries  Recent Labs  Lab 07/11/20 1242  NA 138  K 4.0  CL 105  CO2 23  GLUCOSE 151*  BUN 26*  CREATININE 1.22*  CALCIUM 8.9   ------------------------------------------------------------------------------------------------------------------ estimated creatinine clearance is 24.2 mL/min (A) (by C-G formula based on SCr of 1.22 mg/dL (H)). ------------------------------------------------------------------------------------------------------------------ No results for input(s): TSH, T4TOTAL, T3FREE, THYROIDAB in the last 72 hours.  Invalid input(s): FREET3  Coagulation profile No results for input(s): INR, PROTIME in the last 168 hours. ------------------------------------------------------------------------------------------------------------------- No results for input(s): DDIMER in the last 72 hours. -------------------------------------------------------------------------------------------------------------------  Cardiac Enzymes No results for input(s): CKMB, TROPONINI, MYOGLOBIN in the last 168 hours.  Invalid input(s): CK ------------------------------------------------------------------------------------------------------------------    Component Value Date/Time   BNP 2,601.0 (H) 07/11/2020 1251     ---------------------------------------------------------------------------------------------------------------  Urinalysis    Component Value Date/Time   COLORURINE STRAW (A) 05/30/2020 0941   APPEARANCEUR CLEAR 05/30/2020 0941   APPEARANCEUR Cloudy (A) 06/20/2016 1400  LABSPEC 1.006 05/30/2020 0941   PHURINE 5.0 05/30/2020 0941   GLUCOSEU NEGATIVE 05/30/2020 0941   HGBUR NEGATIVE 05/30/2020 0941   BILIRUBINUR  NEGATIVE 05/30/2020 0941   BILIRUBINUR Negative 06/20/2016 1400   KETONESUR NEGATIVE 05/30/2020 0941   PROTEINUR NEGATIVE 05/30/2020 0941   UROBILINOGEN 0.2 03/17/2016 1043   UROBILINOGEN 0.2 02/15/2009 1217   NITRITE NEGATIVE 05/30/2020 0941   LEUKOCYTESUR NEGATIVE 05/30/2020 0941    ----------------------------------------------------------------------------------------------------------------   Imaging Results:    DG Chest Portable 1 View  Result Date: 07/11/2020 CLINICAL DATA:  History of pulmonary emboli in congestive heart failure. Shortness of breath. EXAM: PORTABLE CHEST 1 VIEW COMPARISON:  06/06/2020 FINDINGS: Extensive artifact overlies the chest. Chronic cardiomegaly. Chronic aortic atherosclerosis and tortuosity. No evidence of pulmonary edema. New patchy density in the right mid lung could represent mild bronchopneumonia. Probable small amount of pleural fluid on the right. Multiple old augmented vertebral body fractures. IMPRESSION: Patchy density in the right mid lung could represent mild bronchopneumonia. Small right effusion suspected. Cardiomegaly and aortic atherosclerosis. No evidence of pulmonary edema. Electronically Signed   By: Nelson Chimes M.D.   On: 07/11/2020 13:47    My personal review of EKG: Rhythm A fib, Rate  87/min, QTc 413    Assessment & Plan:    Active Problems:   Acute on chronic diastolic CHF (congestive heart failure) (HCC)   Chronic atrial fibrillation (HCC)   Dyspnea  Dyspnea -Appears to be multifactorial, in the setting of acute on chronic diastolic CHF community-acquired pneumonia -She will be encouraged use incentive spirometry and flutter valve, please see discussion below volume overload and pneumonia.   Acute on chronic diastolic CHF -With significant evidence of volume overload, with significant elevated BNP, lower extremity edema and chest x-ray finding . -This is likely in setting of lowering her diuresis recently. -She is admitted  under CHF pathway, continue with daily weights, strict ins and outs, will start on Lasix 40 mg IV twice daily, will monitor BMP closely. -No need to repeat echo as it was obtained last month.  Questionable pneumonia -Chest x-ray finding with questionable pneumonia, she denies fever, has no leukocytosis, denies any cough, but given her tenuous respiratory status we will keep her on antibiotic for next 24 hours and DC is improving with diuresis.  Permanent atrial fibrillation -Continue with Toprol and Cardizem for heart rate control, continue with Eliquis for anticoagulation.  PE/DVT -Recent diagnosis last month, continue with Eliquis for anticoagulation  Stage III CKD -Plan this morning lower than baseline, this is due to volume overload, plan to monitor closely while on IV diuresis, avoid nephrotoxic medications   Patient is frail, elderly, at high risk for delirium during hospital stay, it would be very helpful if family can present with her during hospital stay, so I have discussed with staff to allow daughter to remain overnight with the patient if she wishes to do so.   DVT Prophylaxis on Eliquis   AM Labs Ordered, also please review Full Orders  Family Communication: Admission, patients condition and plan of care including tests being ordered have been discussed with the patient and daughter at bedside who indicate understanding and agree with the plan and Code Status.  Code Status Full  Likely DC to  home  Condition GUARDED    Consults called: cardiology    Admission status: observation    Time spent in minutes : 60 minutes   Phillips Climes M.D on 07/11/2020 at 3:27 PM   [facility] - Office  [phone]

## 2020-07-11 NOTE — ED Notes (Signed)
Daughter, [name], called and updated on patient status. All questions answered at this time.

## 2020-07-11 NOTE — Progress Notes (Deleted)
Cardiology Office Note    Date:  07/11/2020   ID:  Torii, Royse Sep 01, 1932, MRN [id number]  PCP:  Kathyrn Drown, MD  Cardiologist: Rozann Lesches, MD    No chief complaint on file.   History of Present Illness:    MERILYNN HAYDU is a 87 y.o. female with past medical history of chronic diastolic CHF, permanent atrial fibrillation, HTN, chronic back pain and recently diagnosed PE/DVT (occurring in 05/2020 and anticoagulation had been held within weeks leading to this for spinal procedures) who presents to the office today for 3-week follow-up.  She was examined by myself on 06/19/2020 for follow-up from her recent hospitalization during which she had been diagnosed with a PE and an acute CHF exacerbation.  At the time of her office visit she reported still having significant back pain with frequent spasms and her family was trying to arrange for SNF placement.  She reported more labored breathing but denied any specific chest pain or palpitations.  She had experienced worsening lower extremity edema and had 1+ pitting edema on examination.  Lasix was increased from 40 mg twice daily to 60 mg twice daily with plans for follow-up labs in 2 weeks.  Follow-up labs showed her creatinine had increased from 1.15 to 1.59 and Lasix was reduced to 40 mg twice daily.    Past Medical History:  Diagnosis Date  . Anxiety   . Arthritis   . Atrial fibrillation ([place])   . CHF (congestive heart failure) ([place])   . Chronic back pain   . Complication of anesthesia   . Constipation   . Depression   . Diastolic heart failure ([place])   . Dyspnea   . Dysrhythmia    AFib  . Essential hypertension   . History of blood transfusion   . History of cervical fracture   . Insomnia   . Migraine headache   . Osteoporosis   . Pneumonia   . PONV (postoperative nausea and vomiting)    "patch helped"  . Trigeminal neuralgia   . 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[facility])     Past Surgical History:  Procedure  Laterality Date  . ANKLE FRACTURE SURGERY Right   . BACK SURGERY  2022  . CARDIAC CATHETERIZATION     denies  . CATARACT EXTRACTION W/PHACO Right 07/22/2012   Procedure: CATARACT EXTRACTION PHACO AND INTRAOCULAR LENS PLACEMENT (IOC);  Surgeon: Tonny Branch, MD;  Location: [facility];  Service: Ophthalmology;  Laterality: Right;  CDE:  18.93  . CATARACT EXTRACTION W/PHACO Left 08/09/2012   Procedure: CATARACT EXTRACTION PHACO AND INTRAOCULAR LENS PLACEMENT (IOC);  Surgeon: Tonny Branch, MD;  Location: [facility];  Service: Ophthalmology;  Laterality: Left;  CDE: 17.21  . COLONOSCOPY    . FLEXIBLE SIGMOIDOSCOPY N/A 11/19/2018   Procedure: FLEXIBLE SIGMOIDOSCOPY;  Surgeon: Danie Binder, MD;  Location: [facility];  Service: Endoscopy;  Laterality: N/A;  7:30am  . HARDWARE REMOVAL  07/18/2011   Procedure: HARDWARE REMOVAL; Right leg-  Surgeon: Carole Civil, MD;  Location: [facility];  Service: Orthopedics;  Laterality: Right;  . HEMORRHOID BANDING N/A 11/19/2018   Procedure: HEMORRHOID BANDING;  Surgeon: Danie Binder, MD;  [facility];  Service: Endoscopy;  Laterality: N/A;  . KYPHOPLASTY N/A 12/26/2016   Procedure: Lumbar two Lumbar three and Lumbar five KYPHOPLASTY;  Surgeon: Erline Levine, MD;  Location: [place];  Service: Neurosurgery;  Laterality: N/A;  . KYPHOPLASTY N/A 02/19/2017   Procedure: KYPHOPLASTY LUMBAR ONE;  Surgeon: Erline Levine, MD;  Location: [place];  Service: Neurosurgery;  Laterality: N/A;  KYPHOPLASTY LUMBAR ONE  . KYPHOPLASTY N/A 04/20/2020   Procedure: Thoracic eleven, Thoracic twelve Kyphoplasty;  Surgeon: Erline Levine, MD;  Location: [place];  Service: Neurosurgery;  Laterality: N/A;  . KYPHOPLASTY N/A 05/15/2020   Procedure: THORACIC NINE, THORACIC TEN  KYPHOPLASTY;  Surgeon: Erline Levine, MD;  Location: [place];  Service: Neurosurgery;  Laterality: N/A;  . NERVE REPAIR  07/18/2011   Procedure: NERVE REPAIR;  Surgeon: Carole Civil, MD;  Location: [facility];  Service:  Orthopedics;  Laterality: Right;  Right leg superficial peroneal nerve release    . PARTIAL HYSTERECTOMY      Current Medications: Outpatient Medications Prior to Visit  Medication Sig Dispense Refill  . acetaminophen (TYLENOL) 325 MG tablet Take 2 tablets (650 mg total) by mouth every 4 (four) hours as needed for mild pain ((score 1 to 3) or temp > 100.5).    Marland Kitchen apixaban (ELIQUIS) 5 MG TABS tablet Take 1 tablet (5 mg total) by mouth 2 (two) times daily. 60 tablet 11  . diltiazem (CARDIZEM CD) 120 MG 24 hr capsule TAKE (1) CAPSULE BY MOUTH ONCE DAILY. (Patient taking differently: Take 120 mg by mouth daily.) 90 capsule 3  . fluticasone (FLONASE) 50 MCG/ACT nasal spray Place 2 sprays into both nostrils daily. (Patient taking differently: Place 2 sprays into both nostrils daily as needed (ears stopped it).) 16 g 5  . furosemide (LASIX) 40 MG tablet Take 1 tablet (40 mg total) by mouth 2 (two) times daily. 180 tablet 3  . hydrocortisone (ANUSOL-HC) 2.5 % rectal cream APPLY RECTALLY THREE TIMES DAILY AS DIRECTED (Patient taking differently: Place 1 application rectally 2 (two) times daily as needed for hemorrhoids or anal itching.) 30 g 5  . HYDROmorphone (DILAUDID) 2 MG tablet Take 2-3 mg by mouth every 6 (six) hours as needed for pain.    . Lidocaine-Hydrocortisone Ace 3-2.5 % KIT APPLY TO RECTUM QID AS NEEDED FOR RECTAL PAIN OR BLEEDING (Patient taking differently: Place 1 application rectally daily as needed (rectal pain or bleeding).) 1 kit 1  . LORazepam (ATIVAN) 0.5 MG tablet Half tablet to one twice daily as needed for anxiety utilize lower dose as much as possible due to narcotics 60 tablet 1  . losartan (COZAAR) 50 MG tablet Take 0.5 tablets (25 mg total) by mouth daily. Hold    . methocarbamol (ROBAXIN) 500 MG tablet Take 1 tablet (500 mg total) by mouth every 8 (eight) hours as needed for muscle spasms. 90 tablet 0  . metolazone (ZAROXOLYN) 2.5 MG tablet Take 1 tablet (2.5 mg total) by mouth  as needed (Take for weight gain > 2 lbs overnight or > 5 lbs in one week.). (Patient not taking: No sig reported) 10 tablet 6  . metoprolol succinate (TOPROL-XL) 100 MG 24 hr tablet TAKE ONE TABLET BY MOUTH ONCE DAILY. (Patient taking differently: Take 100 mg by mouth daily.) 90 tablet 3  . Multiple Vitamin (MULTIVITAMIN WITH MINERALS) TABS tablet Take 1 tablet by mouth daily. Centrum Silver    . Polyethyl Glycol-Propyl Glycol (LUBRICANT EYE DROPS) 0.4-0.3 % SOLN Place 1 drop into both eyes at bedtime.    . polyethylene glycol powder (GLYCOLAX/MIRALAX) 17 GM/SCOOP powder Take 17 g by mouth daily as needed for severe constipation.    . potassium chloride (KLOR-CON) 10 MEQ tablet Take 2 tablets (20 mEq total) by mouth 2 (two) times daily. Take 20 meq am  and 10 mg pm 90 tablet 3  . saccharomyces boulardii (FLORASTOR) 250 MG capsule Take 1 capsule (250 mg total) by mouth 2 (two) times daily. (Patient not taking: Reported on 06/19/2020) 30 capsule 0  . shark liver oil-cocoa butter (PREPARATION H) 0.25-3-85.5 % suppository Place 1 suppository rectally 2 (two) times daily as needed for hemorrhoids.     Marland Kitchen zolpidem (AMBIEN) 5 MG tablet Take 1 tablet (5 mg total) by mouth at bedtime as needed for sleep. 30 tablet 2   No facility-administered medications prior to visit.     Allergies:   Amlodipine, Codeine, Other, and Tramadol   Social History   Socioeconomic History  . Marital status: Widowed    Spouse name: Not on file  . Number of children: 3  . Years of education: Not on file  . Highest education level: Not on file  Occupational History  . Occupation: retired    Fish farm manager: RETIRED  Tobacco Use  . Smoking status: Never Smoker  . Smokeless tobacco: Never Used  Vaping Use  . Vaping Use: Never used  Substance and Sexual Activity  . Alcohol use: No    Alcohol/week: 0.0 standard drinks  . Drug use: No  . Sexual activity: Not Currently    Birth control/protection: Surgical    Comment: hyst   Other Topics Concern  . Not on file  Social History Narrative  . Not on file   Social Determinants of Health   Financial Resource Strain: Not on file  Food Insecurity: Not on file  Transportation Needs: Not on file  Physical Activity: Not on file  Stress: Not on file  Social Connections: Not on file     Family History:  The patient's ***family history includes Alzheimer's disease in her mother; Cancer in her mother; Diabetes in her brother and daughter; Hypertension in her father and mother; Other in her brother and father.   Review of Systems:    Please see the history of present illness.     All other systems reviewed and are otherwise negative except as noted above.   Physical Exam:    VS:  There were no vitals taken for this visit.   General: Well developed, well nourished,female appearing in no acute distress. Head: Normocephalic, atraumatic. Neck: No carotid bruits. JVD not elevated.  Lungs: Respirations regular and unlabored, without wheezes or rales.  Heart: ***Regular rate and rhythm. No S3 or S4.  No murmur, no rubs, or gallops appreciated. Abdomen: Appears non-distended. No obvious abdominal masses. Msk:  Strength and tone appear normal for age. No obvious joint deformities or effusions. Extremities: No clubbing or cyanosis. No edema.  Distal pedal pulses are 2+ bilaterally. Neuro: Alert and oriented X 3. Moves all extremities spontaneously. No focal deficits noted. Psych:  Responds to questions appropriately with a normal affect. Skin: No rashes or lesions noted  Wt Readings from Last 3 Encounters:  06/19/20 110 lb 12.8 oz (50.3 kg)  06/01/20 111 lb 5.3 oz (50.5 kg)  05/15/20 118 lb (53.5 kg)        Studies/Labs Reviewed:   EKG:  EKG is*** ordered today.  The ekg ordered today demonstrates ***  Recent Labs: 05/30/2020: B Natriuretic Peptide 2,187.0 06/01/2020: Magnesium 2.2 06/06/2020: ALT 17 07/03/2020: BUN 33; Creatinine, Ser 1.59; Potassium 4.1;  Sodium 138 07/10/2020: Hemoglobin 11.8; Platelets 283   Lipid Panel    Component Value Date/Time   CHOL 177 09/24/2015 0837   TRIG 71 11/29/2018 0438   HDL 57 09/24/2015 0837  CHOLHDL 3.1 09/24/2015 0837   CHOLHDL 3.1 11/07/2013 0807   VLDL 26 11/07/2013 0807   LDLCALC 93 09/24/2015 0837    Additional studies/ records that were reviewed today include:   Echocardiogram: 05/30/2020 IMPRESSIONS    1. Left ventricular ejection fraction, by estimation, is 60 to 65%. The  left ventricle has normal function. The left ventricle has no regional  wall motion abnormalities. There is mild left ventricular hypertrophy.  Left ventricular diastolic parameters  are indeterminate.  2. Right ventricular systolic function is moderately reduced. The right  ventricular size is moderately enlarged.  3. Left atrial size was moderately dilated.  4. Right atrial size was severely dilated.  5. The mitral valve is degenerative. Trivial mitral valve regurgitation.  No evidence of mitral stenosis. Moderate mitral annular calcification.  6. Estimated PA pressure elevated and higher than estimate on echo done  09/28/19 . Tricuspid valve regurgitation is moderate.  7. The aortic valve is calcified. There is moderate calcification of the  aortic valve. Aortic valve regurgitation is mild. Mild to moderate aortic  valve sclerosis/calcification is present, without any evidence of aortic  stenosis.  8. The inferior vena cava is dilated in size with >50% respiratory  variability, suggesting right atrial pressure of 8 mmHg.  Assessment:    No diagnosis found.   Plan:   In order of problems listed above:  1. ***    Shared Decision Making/Informed Consent:   {Are you ordering a CV Procedure (e.g. stress test, cath, DCCV, TEE, etc)?   Press F2        :[id number]}    Medication Adjustments/Labs and Tests Ordered: Current medicines are reviewed at length with the patient today.  Concerns  regarding medicines are outlined above.  Medication changes, Labs and Tests ordered today are listed in the Patient Instructions below. There are no Patient Instructions on file for this visit.   Signed, Erma Heritage, PA-C  07/11/2020 7:41 AM    [facility] [address] [place], [place] [address] Phone: [phone] Fax: [phone]

## 2020-07-11 NOTE — ED Triage Notes (Signed)
Pt's daughter reports pt recently admitted for PE and CHF a few months ago.  Was at cardiolgist office today and was told to come here for evaluation of SOB.  Reports has been retaining fluid.  Reports cardiology lowered pt's lasix dosage approx 1 week ago and daughter says pt started retaining fluid since then.  Reports was told had crackles in lungs.

## 2020-07-11 NOTE — Consult Note (Signed)
Cardiology Consultation:   Patient ID: Katherine Lane MRN: [id number]; DOB: 09/08/1932  Admit date: 07/11/2020 Date of Consult: 07/11/2020  PCP:  Kathyrn Drown, MD   [facility] Providers Cardiologist:  Rozann Lesches, MD   {   Patient Profile:   Katherine Lane is a 87 y.o. female with past medical history of chronic diastolic CHF, permanent atrial fibrillation, HTN, chronic back pain and recently diagnosed PE/DVT (occurring in 05/2020 and anticoagulation had been held within weeks leading to this for spinal procedures) who is being seen today for the evaluation of CHF.   History of Present Illness:   Katherine Lane was examined by myself on 06/19/2020 for follow-up from her recent hospitalization during which she had been diagnosed with a PE and an acute CHF exacerbation. At the time of her office visit she reported still having significant back pain with frequent spasms and her family was trying to arrange for SNF placement. She reported more labored breathing but denied any specific chest pain or palpitations. She had experienced worsening lower extremity edema and had 1+ pitting edema on examination. Lasix was increased from 40 mg twice daily to 60 mg twice daily with plans for follow-up labs in 2 weeks.  Follow-up labs showed her creatinine had increased from 1.15 to 1.59 and Lasix was reduced to 40 mg twice daily  The patient was scheduled for a follow-up visit today but presented to the office earlier at the time of her lab work. The patient's daughter provides most of the history and reports she has been having worsening dyspnea on exertion over the past 3 to 4 days and has also had orthopnea and PND. She has been experiencing difficulty sleeping over the past few nights due to her respiratory status. She also notes worsening abdominal distention and lower extremity edema. No reported chest pain or palpitations. They report that her weight has been stable but she has also been  experiencing intermittent diarrhea over the past week and is unsure of how this has influenced her weight.  Past Medical History:  Diagnosis Date  . Anxiety   . Arthritis   . Atrial fibrillation ([place])   . CHF (congestive heart failure) ([place])   . Chronic back pain   . Complication of anesthesia   . Constipation   . Depression   . Diastolic heart failure ([place])   . Dyspnea   . Dysrhythmia    AFib  . Essential hypertension   . History of blood transfusion   . History of cervical fracture   . Insomnia   . Migraine headache   . Osteoporosis   . Pneumonia   . PONV (postoperative nausea and vomiting)    "patch helped"  . Trigeminal neuralgia   . 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[facility])     Past Surgical History:  Procedure Laterality Date  . ANKLE FRACTURE SURGERY Right   . BACK SURGERY  2022  . CARDIAC CATHETERIZATION     denies  . CATARACT EXTRACTION W/PHACO Right 07/22/2012   Procedure: CATARACT EXTRACTION PHACO AND INTRAOCULAR LENS PLACEMENT (IOC);  Surgeon: Tonny Branch, MD;  Location: [facility];  Service: Ophthalmology;  Laterality: Right;  CDE:  18.93  . CATARACT EXTRACTION W/PHACO Left 08/09/2012   Procedure: CATARACT EXTRACTION PHACO AND INTRAOCULAR LENS PLACEMENT (IOC);  Surgeon: Tonny Branch, MD;  Location: [facility];  Service: Ophthalmology;  Laterality: Left;  CDE: 17.21  . COLONOSCOPY    . FLEXIBLE SIGMOIDOSCOPY N/A 11/19/2018   Procedure: FLEXIBLE SIGMOIDOSCOPY;  Surgeon:  Fields, Marga Melnick, MD;  Location: [facility];  Service: Endoscopy;  Laterality: N/A;  7:30am  . HARDWARE REMOVAL  07/18/2011   Procedure: HARDWARE REMOVAL; Right leg-  Surgeon: Carole Civil, MD;  Location: [facility];  Service: Orthopedics;  Laterality: Right;  . HEMORRHOID BANDING N/A 11/19/2018   Procedure: HEMORRHOID BANDING;  Surgeon: Danie Binder, MD;  [facility];  Service: Endoscopy;  Laterality: N/A;  . KYPHOPLASTY N/A 12/26/2016   Procedure: Lumbar two Lumbar three and Lumbar five  KYPHOPLASTY;  Surgeon: Erline Levine, MD;  Location: [place];  Service: Neurosurgery;  Laterality: N/A;  . KYPHOPLASTY N/A 02/19/2017   Procedure: KYPHOPLASTY LUMBAR ONE;  Surgeon: Erline Levine, MD;  Location: [place];  Service: Neurosurgery;  Laterality: N/A;  KYPHOPLASTY LUMBAR ONE  . KYPHOPLASTY N/A 04/20/2020   Procedure: Thoracic eleven, Thoracic twelve Kyphoplasty;  Surgeon: Erline Levine, MD;  Location: [place];  Service: Neurosurgery;  Laterality: N/A;  . KYPHOPLASTY N/A 05/15/2020   Procedure: THORACIC NINE, THORACIC TEN  KYPHOPLASTY;  Surgeon: Erline Levine, MD;  Location: [place];  Service: Neurosurgery;  Laterality: N/A;  . NERVE REPAIR  07/18/2011   Procedure: NERVE REPAIR;  Surgeon: Carole Civil, MD;  Location: [facility];  Service: Orthopedics;  Laterality: Right;  Right leg superficial peroneal nerve release    . PARTIAL HYSTERECTOMY       Home Medications:  Prior to Admission medications   Medication Sig Start Date End Date Taking? Authorizing Provider  acetaminophen (TYLENOL) 325 MG tablet Take 2 tablets (650 mg total) by mouth every 4 (four) hours as needed for mild pain ((score 1 to 3) or temp > 100.5). 04/21/20   Consuella Lose, MD  apixaban (ELIQUIS) 5 MG TABS tablet Take 1 tablet (5 mg total) by mouth 2 (two) times daily. 06/19/20   Sandee Bernath, Fransisco Hertz, PA-C  diltiazem (CARDIZEM CD) 120 MG 24 hr capsule TAKE (1) CAPSULE BY MOUTH ONCE DAILY. Patient taking differently: Take 120 mg by mouth daily. 12/27/19   Satira Sark, MD  fluticasone (FLONASE) 50 MCG/ACT nasal spray Place 2 sprays into both nostrils daily. Patient taking differently: Place 2 sprays into both nostrils daily as needed (ears stopped it). 12/20/19   Kathyrn Drown, MD  furosemide (LASIX) 40 MG tablet Take 1 tablet (40 mg total) by mouth 2 (two) times daily. 07/03/20 10/01/20  Imogene Burn, PA-C  hydrocortisone (ANUSOL-HC) 2.5 % rectal cream APPLY RECTALLY THREE TIMES DAILY AS DIRECTED Patient taking  differently: Place 1 application rectally 2 (two) times daily as needed for hemorrhoids or anal itching. 10/19/18   Mikey Kirschner, MD  HYDROmorphone (DILAUDID) 2 MG tablet Take 2-3 mg by mouth every 6 (six) hours as needed for pain. 05/08/20   [provider]  Lidocaine-Hydrocortisone Ace 3-2.5 % KIT APPLY TO RECTUM QID AS NEEDED FOR RECTAL PAIN OR BLEEDING Patient taking differently: Place 1 application rectally daily as needed (rectal pain or bleeding). 05/12/19   Fields, Marga Melnick, MD  LORazepam (ATIVAN) 0.5 MG tablet Half tablet to one twice daily as needed for anxiety utilize lower dose as much as possible due to narcotics 06/22/20   Luking, Elayne Snare, MD  losartan (COZAAR) 50 MG tablet Take 0.5 tablets (25 mg total) by mouth daily. Hold 06/01/20 08/30/20  Barton Dubois, MD  methocarbamol (ROBAXIN) 500 MG tablet Take 1 tablet (500 mg total) by mouth every 8 (eight) hours as needed for muscle spasms. 04/21/20   Consuella Lose, MD  metolazone (ZAROXOLYN) 2.5 MG tablet Take 1 tablet (2.5 mg total) by mouth as needed (Take for weight gain > 2 lbs overnight or > 5 lbs in one week.). Patient not taking: No sig reported 04/12/20 07/11/20  Erma Heritage, PA-C  metoprolol succinate (TOPROL-XL) 100 MG 24 hr tablet TAKE ONE TABLET BY MOUTH ONCE DAILY. Patient taking differently: Take 100 mg by mouth daily. 03/29/20   Satira Sark, MD  Multiple Vitamin (MULTIVITAMIN WITH MINERALS) TABS tablet Take 1 tablet by mouth daily. Centrum Silver    [provider]  Polyethyl Glycol-Propyl Glycol (LUBRICANT EYE DROPS) 0.4-0.3 % SOLN Place 1 drop into both eyes at bedtime.    [provider]  polyethylene glycol powder (GLYCOLAX/MIRALAX) 17 GM/SCOOP powder Take 17 g by mouth daily as needed for severe constipation. 06/01/20   Barton Dubois, MD  potassium chloride (KLOR-CON) 10 MEQ tablet Take 2 tablets (20 mEq total) by mouth 2 (two) times daily. Take 20 meq am and 10 mg pm 06/01/20    Barton Dubois, MD  saccharomyces boulardii (FLORASTOR) 250 MG capsule Take 1 capsule (250 mg total) by mouth 2 (two) times daily. Patient not taking: Reported on 06/19/2020 06/01/20   Barton Dubois, MD  shark liver oil-cocoa butter (PREPARATION H) 0.25-3-85.5 % suppository Place 1 suppository rectally 2 (two) times daily as needed for hemorrhoids.     [provider]  zolpidem (AMBIEN) 5 MG tablet Take 1 tablet (5 mg total) by mouth at bedtime as needed for sleep. 07/02/20   Kathyrn Drown, MD    Inpatient Medications: Scheduled Meds:  Continuous Infusions:  PRN Meds:   Allergies:    Allergies  Allergen Reactions  . Amlodipine Swelling    Leg swelling   . Codeine Nausea And Vomiting and Other (See Comments)    Patient states "intolerance to all pain medications"  (NO OPIOIDS) VERY VIOLENT VOMITING!!  . Other Nausea And Vomiting and Other (See Comments)    general anesthesia  . Tramadol Nausea And Vomiting and Other (See Comments)    "NO OPIOIDS!!! VERY VIOLENT VOMITING!!" per Med History prior to 02/18/17    Social History:   Social History   Socioeconomic History  . Marital status: Widowed    Spouse name: Not on file  . Number of children: 3  . Years of education: Not on file  . Highest education level: Not on file  Occupational History  . Occupation: retired    Fish farm manager: RETIRED  Tobacco Use  . Smoking status: Never Smoker  . Smokeless tobacco: Never Used  Vaping Use  . Vaping Use: Never used  Substance and Sexual Activity  . Alcohol use: No    Alcohol/week: 0.0 standard drinks  . Drug use: No  . Sexual activity: Not Currently    Birth control/protection: Surgical    Comment: hyst  Other Topics Concern  . Not on file  Social History Narrative  . Not on file   Social Determinants of Health   Financial Resource Strain: Not on file  Food Insecurity: Not on file  Transportation Needs: Not on file  Physical Activity: Not on file  Stress: Not on file   Social Connections: Not on file  Intimate Partner Violence: Not on file    Family History:    Family History  Problem Relation Age of Onset  . Hypertension Mother   . Alzheimer's disease Mother   . Cancer Mother   . Hypertension Father   . Other Father  abused pain meds  . Diabetes Brother   . Other Brother        back problems  . Diabetes Daughter   . Anesthesia problems Neg Hx   . Hypotension Neg Hx   . Malignant hyperthermia Neg Hx   . Pseudochol deficiency Neg Hx      ROS:  Please see the history of present illness.   All other ROS reviewed and negative.     Physical Exam/Data:   Vitals:   07/11/20 1330 07/11/20 1400 07/11/20 1430 07/11/20 1500  BP: 119/87 113/82 (!) 144/94 (!) 131/101  Pulse: 65 60 75 80  Resp: (!) 29 20 (!) 33 (!) 21  Temp:      TempSrc:      SpO2: 96% 95% 97% 97%  Weight:      Height:       No intake or output data in the 24 hours ending 07/11/20 1521 Last 3 Weights 07/11/2020 06/19/2020 06/01/2020  Weight (lbs) 110 lb 110 lb 12.8 oz 111 lb 5.3 oz  Weight (kg) 49.896 kg 50.259 kg 50.5 kg     Body mass index is 21.48 kg/m.  General:  Pleasant elderly female appearing in no acute distress. HEENT: normal Lymph: no adenopathy Neck: no JVD Endocrine:  No thryomegaly Vascular: No carotid bruits; FA pulses 2+ bilaterally without bruits  Cardiac:  normal S1, S2; Irregularly irregular. Lungs:  Rales along bases bilaterally. No wheezing.  Abd: soft, nontender, no hepatomegaly  Ext: 1+ pitting edema, more notable along LLE Musculoskeletal:  No deformities, BUE and BLE strength normal and equal Skin: warm and dry  Neuro:  CNs 2-12 intact, no focal abnormalities noted Psych:  Normal affect   EKG:  The EKG was personally reviewed and demonstrates: Rate-controlled atrial fibrillation, HR 87 with LPFB.   Relevant CV Studies:  Echocardiogram: 05/30/2020 IMPRESSIONS    1. Left ventricular ejection fraction, by estimation, is 60 to 65%.  The  left ventricle has normal function. The left ventricle has no regional  wall motion abnormalities. There is mild left ventricular hypertrophy.  Left ventricular diastolic parameters  are indeterminate.  2. Right ventricular systolic function is moderately reduced. The right  ventricular size is moderately enlarged.  3. Left atrial size was moderately dilated.  4. Right atrial size was severely dilated.  5. The mitral valve is degenerative. Trivial mitral valve regurgitation.  No evidence of mitral stenosis. Moderate mitral annular calcification.  6. Estimated PA pressure elevated and higher than estimate on echo done  09/28/19 . Tricuspid valve regurgitation is moderate.  7. The aortic valve is calcified. There is moderate calcification of the  aortic valve. Aortic valve regurgitation is mild. Mild to moderate aortic  valve sclerosis/calcification is present, without any evidence of aortic  stenosis.  8. The inferior vena cava is dilated in size with >50% respiratory  variability, suggesting right atrial pressure of 8 mmHg.  Laboratory Data:  High Sensitivity Troponin:  No results for input(s): TROPONINIHS in the last 720 hours.   Chemistry Recent Labs  Lab 07/11/20 1242  NA 138  K 4.0  CL 105  CO2 23  GLUCOSE 151*  BUN 26*  CREATININE 1.22*  CALCIUM 8.9  GFRNONAA 43*  ANIONGAP 10    No results for input(s): PROT, ALBUMIN, AST, ALT, ALKPHOS, BILITOT in the last 168 hours. Hematology Recent Labs  Lab 07/10/20 1119 07/11/20 1242  WBC 6.9 5.8  RBC 4.49 4.65  HGB 11.8* 12.4  HCT 40.2 41.8  MCV  89.5 89.9  MCH 26.3 26.7  MCHC 29.4* 29.7*  RDW 23.6* 24.0*  PLT 283 334   BNP Recent Labs  Lab 07/11/20 1251  BNP 2,601.0*    DDimer No results for input(s): DDIMER in the last 168 hours.   Radiology/Studies:  DG Chest Portable 1 View  Result Date: 07/11/2020 CLINICAL DATA:  History of pulmonary emboli in congestive heart failure. Shortness of breath.  EXAM: PORTABLE CHEST 1 VIEW COMPARISON:  06/06/2020 FINDINGS: Extensive artifact overlies the chest. Chronic cardiomegaly. Chronic aortic atherosclerosis and tortuosity. No evidence of pulmonary edema. New patchy density in the right mid lung could represent mild bronchopneumonia. Probable small amount of pleural fluid on the right. Multiple old augmented vertebral body fractures. IMPRESSION: Patchy density in the right mid lung could represent mild bronchopneumonia. Small right effusion suspected. Cardiomegaly and aortic atherosclerosis. No evidence of pulmonary edema. Electronically Signed   By: Nelson Chimes M.D.   On: 07/11/2020 13:47     Assessment and Plan:   1. Acute HFpEF - She presents to the office with volume overload and reports worsening dyspnea on exertion, orthopnea, PND, lower extremity edema and abdominal distention over the past 3 to 4 days. Lasix was previously titrated with improvement in symptoms but dosing was subsequently reduced given worsening renal function. - Given her volume overload on examination and significant symptoms, I did recommend that she proceed to the ED for evaluation today. She will likely require admission with plans for IV diuresis. Would anticipate starting IV Lasix 40 mg twice daily pending assessment of her renal function. No indication to repeat an echocardiogram at this time as one was obtained last month.  2. Permanent Atrial Fibrillation - She denies any recent palpitations. Would continue Toprol-XL 100 mg daily and Cardizem CD 120 mg daily for rate control. - She remains on Eliquis 5 mg twice daily for anticoagulation in the setting of atrial fibrillation and due to her recent PE/DVT.  3. Pulmonary Embolism/DVT - Occurred last month in the setting of her anticoagulation previously being held for spinal procedures. Her oxygen saturations have been appropriate when checked at home. Would recommend checking ambulatory oxygen saturations prior to hospital  discharge. - Continue Eliquis 5 mg twice daily.  4. Stage 3 CKD - Creatinine was elevated to 1.59 by most recent labs. Baseline around 1.2. Follow closely with IV diuresis.   5. Diarrhea - Her daughter reports she has experienced intermittent episodes of diarrhea over the past week. Denies any episodes today. If she has recurrence, would recommend obtaining a stool sample.   Risk Assessment/Risk Scores:        New York Heart Association (NYHA) Functional Class NYHA Class IV  CHA2DS2-VASc Score = 6  This indicates a 9.7% annual risk of stroke. The patient's score is based upon: CHF History: Yes HTN History: Yes Diabetes History: No Stroke History: No Vascular Disease History: Yes Age Score: 2 Gender Score: 1    For questions or updates, please contact [place] Please consult [email or web address] for contact info under    Signed, Erma Heritage, PA-C  07/11/2020 3:21 PM

## 2020-07-11 NOTE — ED Notes (Signed)
Please call daughter, Dionicia Abler, [phone] with any updates.

## 2020-07-11 NOTE — ED Notes (Signed)
Pt put on cardiac monitor.

## 2020-07-11 NOTE — ED Provider Notes (Signed)
[facility] Provider Note   CSN: [id number] Arrival date & time: 07/11/20  1210     History Chief Complaint  Patient presents with  . Shortness of Breath    Katherine Lane is a 87 y.o. female.  87 year old female with history of CHF, hypertension, atrial fib, and recent PE (05/30/20). Patient is currently on eliquis. Patient has been having increasing shortness of breath over the last several days. Her lasix dose was decreased one week ago due to elevated creatinine. Patient has started to retain fluid since lasix reduction. Daughter reports oxygen saturations at home have remained in the low to mid 90's.   The history is provided by the patient, a relative and medical records. No language interpreter was used.  Shortness of Breath Severity:  Moderate Onset quality:  Gradual Duration:  3 days Timing:  Intermittent Progression:  Worsening Context: activity   Ineffective treatments:  Rest and oxygen Associated symptoms: chest pain        Past Medical History:  Diagnosis Date  . Anxiety   . Arthritis   . Atrial fibrillation ([place])   . CHF (congestive heart failure) ([place])   . Chronic back pain   . Complication of anesthesia   . Constipation   . Depression   . Diastolic heart failure ([place])   . Dyspnea   . Dysrhythmia    AFib  . Essential hypertension   . History of blood transfusion   . History of cervical fracture   . Insomnia   . Migraine headache   . Osteoporosis   . Pneumonia   . PONV (postoperative nausea and vomiting)    "patch helped"  . Trigeminal neuralgia   . 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 Acute respiratory failure with hypoxia ([place])   . Diarrhea   . Pulmonary embolism ([place])   . Acute on chronic diastolic HF (heart failure) ([place]) 05/30/2020  . Thoracic vertebral fracture ([place]) 04/20/2020  . Chronic atrial fibrillation ([place]) 02/21/2020  . Anticoagulated 02/21/2020  .  Hypokalemia 09/27/2019  . Bilateral pleural effusion 09/26/2019  . Age-related osteoporosis without current pathological fracture 06/10/2019  . Hemorrhoids, external, with complication 41/32/4401  . Rectal bleeding   . Rectal pain   . Hemorrhoids, internal, with bleeding 01/06/2018  . Hypertensive urgency 12/16/2017  . Intractable episodic headache   . Visual changes   . Depression with anxiety   . Chronic diastolic congestive heart failure (Katherine Lane) 10/31/2017  . Atypical chest pain 10/31/2017  . Compression fracture of L1 lumbar vertebra ([facility]) 02/19/2017  . Lumbar compression fracture ([place]) 12/26/2016  . Gastroesophageal reflux disease without esophagitis 07/18/2016  . Acute on chronic diastolic CHF (congestive heart failure) ([place]) 07/11/2016  . Chronic coughing 07/11/2016  . Migraine headache 07/01/2016  . Anxiety 06/30/2016  . Altered mental status 12/06/2015  . Prolonged Q-T interval on ECG 12/06/2015  . Postural dizziness with presyncope 12/06/2015  . PAF (paroxysmal atrial fibrillation) ([place]) 07/14/2013  . Atrial flutter ([place]) 05/12/2013  . Bulging discs 04/20/2013  . Trigeminal neuralgia 01/15/2013  . Cellulitis 07/31/2011  . Mechanical complication of internal orthopedic implant ([place]) 07/21/2011  . 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 Ankle fracture 07/02/2010  . LESION OF PLANTAR NERVE 05/14/2009  . CLOSED BIMALLEOLAR FRACTURE 03/01/2009  . Essential hypertension 11/07/2008  . CONSTIPATION, CHRONIC 11/07/2008  . Osteoporosis 11/07/2008    Past Surgical History:  Procedure Laterality Date  .  ANKLE FRACTURE SURGERY Right   . BACK SURGERY  2022  . CARDIAC CATHETERIZATION     denies  . CATARACT EXTRACTION W/PHACO Right 07/22/2012   Procedure: CATARACT EXTRACTION PHACO AND INTRAOCULAR LENS PLACEMENT (IOC);  Surgeon: Tonny Branch, MD;  Location: [facility];  Service: Ophthalmology;  Laterality: Right;  CDE:  18.93  . CATARACT EXTRACTION W/PHACO Left 08/09/2012    Procedure: CATARACT EXTRACTION PHACO AND INTRAOCULAR LENS PLACEMENT (IOC);  Surgeon: Tonny Branch, MD;  Location: [facility];  Service: Ophthalmology;  Laterality: Left;  CDE: 17.21  . COLONOSCOPY    . FLEXIBLE SIGMOIDOSCOPY N/A 11/19/2018   Procedure: FLEXIBLE SIGMOIDOSCOPY;  Surgeon: Danie Binder, MD;  Location: [facility];  Service: Endoscopy;  Laterality: N/A;  7:30am  . HARDWARE REMOVAL  07/18/2011   Procedure: HARDWARE REMOVAL; Right leg-  Surgeon: Carole Civil, MD;  Location: [facility];  Service: Orthopedics;  Laterality: Right;  . HEMORRHOID BANDING N/A 11/19/2018   Procedure: HEMORRHOID BANDING;  Surgeon: Danie Binder, MD;  [facility];  Service: Endoscopy;  Laterality: N/A;  . KYPHOPLASTY N/A 12/26/2016   Procedure: Lumbar two Lumbar three and Lumbar five KYPHOPLASTY;  Surgeon: Erline Levine, MD;  Location: [facility];  Service: Neurosurgery;  Laterality: N/A;  . KYPHOPLASTY N/A 02/19/2017   Procedure: KYPHOPLASTY LUMBAR ONE;  Surgeon: Erline Levine, MD;  Location: [facility];  Service: Neurosurgery;  Laterality: N/A;  KYPHOPLASTY LUMBAR ONE  . KYPHOPLASTY N/A 04/20/2020   Procedure: Thoracic eleven, Thoracic twelve Kyphoplasty;  Surgeon: Erline Levine, MD;  Location: [facility];  Service: Neurosurgery;  Laterality: N/A;  . KYPHOPLASTY N/A 05/15/2020   Procedure: THORACIC NINE, THORACIC TEN  KYPHOPLASTY;  Surgeon: Erline Levine, MD;  Location: [facility];  Service: Neurosurgery;  Laterality: N/A;  . NERVE REPAIR  07/18/2011   Procedure: NERVE REPAIR;  Surgeon: Carole Civil, MD;  Location: [facility];  Service: Orthopedics;  Laterality: Right;  Right leg superficial peroneal nerve release    . PARTIAL HYSTERECTOMY       OB History    Gravida  3   Para  3   Term  3   Preterm      AB      Living  3     SAB      IAB      Ectopic      Multiple      Live Births  3           Family History  Problem Relation Age of Onset  . Hypertension Mother   . Alzheimer's disease  Mother   . Cancer Mother   . Hypertension Father   . Other Father        abused pain meds  . Diabetes Brother   . Other Brother        back problems  . Diabetes Daughter   . Anesthesia problems Neg Hx   . Hypotension Neg Hx   . Malignant hyperthermia Neg Hx   . Pseudochol deficiency Neg Hx     Social History   Tobacco Use  . Smoking status: Never Smoker  . Smokeless tobacco: Never Used  Vaping Use  . Vaping Use: Never used  Substance Use Topics  . Alcohol use: No    Alcohol/week: 0.0 standard drinks  . Drug use: No    Home Medications Prior to Admission medications   Medication Sig Start Date End Date Taking? Authorizing Provider  acetaminophen (TYLENOL) 325 MG tablet Take 2  tablets (650 mg total) by mouth every 4 (four) hours as needed for mild pain ((score 1 to 3) or temp > 100.5). 04/21/20   Consuella Lose, MD  apixaban (ELIQUIS) 5 MG TABS tablet Take 1 tablet (5 mg total) by mouth 2 (two) times daily. 06/19/20   Strader, Fransisco Hertz, PA-C  diltiazem (CARDIZEM CD) 120 MG 24 hr capsule TAKE (1) CAPSULE BY MOUTH ONCE DAILY. Patient taking differently: Take 120 mg by mouth daily. 12/27/19   Satira Sark, MD  fluticasone (FLONASE) 50 MCG/ACT nasal spray Place 2 sprays into both nostrils daily. Patient taking differently: Place 2 sprays into both nostrils daily as needed (ears stopped it). 12/20/19   Kathyrn Drown, MD  furosemide (LASIX) 40 MG tablet Take 1 tablet (40 mg total) by mouth 2 (two) times daily. 07/03/20 10/01/20  Imogene Burn, PA-C  hydrocortisone (ANUSOL-HC) 2.5 % rectal cream APPLY RECTALLY THREE TIMES DAILY AS DIRECTED Patient taking differently: Place 1 application rectally 2 (two) times daily as needed for hemorrhoids or anal itching. 10/19/18   Mikey Kirschner, MD  HYDROmorphone (DILAUDID) 2 MG tablet Take 2-3 mg by mouth every 6 (six) hours as needed for pain. 05/08/20   [provider]  Lidocaine-Hydrocortisone Ace 3-2.5 % KIT APPLY TO RECTUM  QID AS NEEDED FOR RECTAL PAIN OR BLEEDING Patient taking differently: Place 1 application rectally daily as needed (rectal pain or bleeding). 05/12/19   Fields, Marga Melnick, MD  LORazepam (ATIVAN) 0.5 MG tablet Half tablet to one twice daily as needed for anxiety utilize lower dose as much as possible due to narcotics 06/22/20   Luking, Elayne Snare, MD  losartan (COZAAR) 50 MG tablet Take 0.5 tablets (25 mg total) by mouth daily. Hold 06/01/20 08/30/20  Barton Dubois, MD  methocarbamol (ROBAXIN) 500 MG tablet Take 1 tablet (500 mg total) by mouth every 8 (eight) hours as needed for muscle spasms. 04/21/20   Consuella Lose, MD  metolazone (ZAROXOLYN) 2.5 MG tablet Take 1 tablet (2.5 mg total) by mouth as needed (Take for weight gain > 2 lbs overnight or > 5 lbs in one week.). Patient not taking: No sig reported 04/12/20 07/11/20  Erma Heritage, PA-C  metoprolol succinate (TOPROL-XL) 100 MG 24 hr tablet TAKE ONE TABLET BY MOUTH ONCE DAILY. Patient taking differently: Take 100 mg by mouth daily. 03/29/20   Satira Sark, MD  Multiple Vitamin (MULTIVITAMIN WITH MINERALS) TABS tablet Take 1 tablet by mouth daily. Centrum Silver    [provider]  Polyethyl Glycol-Propyl Glycol (LUBRICANT EYE DROPS) 0.4-0.3 % SOLN Place 1 drop into both eyes at bedtime.    [provider]  polyethylene glycol powder (GLYCOLAX/MIRALAX) 17 GM/SCOOP powder Take 17 g by mouth daily as needed for severe constipation. 06/01/20   Barton Dubois, MD  potassium chloride (KLOR-CON) 10 MEQ tablet Take 2 tablets (20 mEq total) by mouth 2 (two) times daily. Take 20 meq am and 10 mg pm 06/01/20   Barton Dubois, MD  saccharomyces boulardii (FLORASTOR) 250 MG capsule Take 1 capsule (250 mg total) by mouth 2 (two) times daily. Patient not taking: Reported on 06/19/2020 06/01/20   Barton Dubois, MD  shark liver oil-cocoa butter (PREPARATION H) 0.25-3-85.5 % suppository Place 1 suppository rectally 2 (two) times daily as needed  for hemorrhoids.     [provider]  zolpidem (AMBIEN) 5 MG tablet Take 1 tablet (5 mg total) by mouth at bedtime as needed for sleep. 07/02/20   Luking,  Elayne Snare, MD    Allergies    Amlodipine, Codeine, Other, and Tramadol  Review of Systems   Review of Systems  Respiratory: Positive for shortness of breath.   Cardiovascular: Positive for chest pain and leg swelling.  Musculoskeletal: Positive for back pain.  All other systems reviewed and are negative.   Physical Exam Updated Vital Signs BP 124/85   Pulse 79   Temp 97.7 F (36.5 C) (Oral)   Resp (!) 27   Ht 5' (1.524 m)   Wt 49.9 kg   SpO2 91%   BMI 21.48 kg/m   Physical Exam Vitals and nursing note reviewed.  HENT:     Head: Normocephalic.     Mouth/Throat:     Mouth: Mucous membranes are moist.  Eyes:     Conjunctiva/sclera: Conjunctivae normal.  Cardiovascular:     Rate and Rhythm: Normal rate.  Pulmonary:     Effort: Respiratory distress present.  Abdominal:     Palpations: Abdomen is soft.  Musculoskeletal:     Right lower leg: Edema present.     Left lower leg: Edema present.  Skin:    General: Skin is warm and dry.  Neurological:     Mental Status: She is alert and oriented to person, place, and time.  Psychiatric:        Mood and Affect: Mood normal.        Behavior: Behavior normal.     ED Results / Procedures / Treatments   Labs (all labs ordered are listed, but only abnormal results are displayed) Labs Reviewed  BASIC METABOLIC PANEL - Abnormal; Notable for the following components:      Result Value   Glucose, Bld 151 (*)    BUN 26 (*)    Creatinine, Ser 1.22 (*)    GFR, Estimated 43 (*)    All other components within normal limits  CBC - Abnormal; Notable for the following components:   MCHC 29.7 (*)    RDW 24.0 (*)    All other components within normal limits  BRAIN NATRIURETIC PEPTIDE - Abnormal; Notable for the following components:   B Natriuretic Peptide 2,601.0 (*)     All other components within normal limits  BLOOD GAS, VENOUS  TROPONIN I (HIGH SENSITIVITY)    EKG None  Radiology DG Chest Portable 1 View  Result Date: 07/11/2020 CLINICAL DATA:  History of pulmonary emboli in congestive heart failure. Shortness of breath. EXAM: PORTABLE CHEST 1 VIEW COMPARISON:  06/06/2020 FINDINGS: Extensive artifact overlies the chest. Chronic cardiomegaly. Chronic aortic atherosclerosis and tortuosity. No evidence of pulmonary edema. New patchy density in the right mid lung could represent mild bronchopneumonia. Probable small amount of pleural fluid on the right. Multiple old augmented vertebral body fractures. IMPRESSION: Patchy density in the right mid lung could represent mild bronchopneumonia. Small right effusion suspected. Cardiomegaly and aortic atherosclerosis. No evidence of pulmonary edema. Electronically Signed   By: Nelson Chimes M.D.   On: 07/11/2020 13:47    Procedures Procedures   Medications Ordered in ED Medications - No data to display  ED Course  I have reviewed the triage vital signs and the nursing notes.  Pertinent labs & imaging results that were available during my care of the patient were reviewed by me and considered in my medical decision making (see chart for details).    MDM Rules/Calculators/A&P  Patient discussed with attending, Dr. Almyra Free. Patient with shortness of breath. Concern for pneumonia with associated acute on chronic heart failure. CAP antibiotics initiated.  Will request admission. Patient discussed with hospitalist, Dr. Waldron Labs. Final Clinical Impression(s) / ED Diagnoses Final diagnoses:  Community acquired pneumonia of right middle lobe of lung  Acute on chronic diastolic congestive heart failure [facility])    Rx / DC Orders ED Discharge Orders    None       Etta Quill, NP 07/11/20 [place], MD 07/14/20 2006

## 2020-07-11 NOTE — Addendum Note (Signed)
Addended by: Kerney Elbe on: 07/11/2020 07:24 AM   Modules accepted: Orders

## 2020-07-11 NOTE — ED Notes (Signed)
Changed urine canister 

## 2020-07-11 NOTE — ED Notes (Addendum)
Pt assisted to bedside commode with x1 assist.  New brief applied and pt was repositioned in bed for comfort. No other requests at this time. Call bell within reach, bed in low position. Will continue to monitor.

## 2020-07-12 DIAGNOSIS — I445 Left posterior fascicular block: Secondary | ICD-10-CM | POA: Diagnosis present

## 2020-07-12 DIAGNOSIS — I482 Chronic atrial fibrillation, unspecified: Secondary | ICD-10-CM | POA: Diagnosis not present

## 2020-07-12 DIAGNOSIS — I420 Dilated cardiomyopathy: Secondary | ICD-10-CM | POA: Diagnosis present

## 2020-07-12 DIAGNOSIS — Z66 Do not resuscitate: Secondary | ICD-10-CM | POA: Diagnosis present

## 2020-07-12 DIAGNOSIS — Z6821 Body mass index (BMI) 21.0-21.9, adult: Secondary | ICD-10-CM | POA: Diagnosis not present

## 2020-07-12 DIAGNOSIS — Z9109 Other allergy status, other than to drugs and biological substances: Secondary | ICD-10-CM | POA: Diagnosis not present

## 2020-07-12 DIAGNOSIS — K649 Unspecified hemorrhoids: Secondary | ICD-10-CM | POA: Diagnosis present

## 2020-07-12 DIAGNOSIS — R0603 Acute respiratory distress: Secondary | ICD-10-CM | POA: Diagnosis not present

## 2020-07-12 DIAGNOSIS — N1832 Chronic kidney disease, stage 3b: Secondary | ICD-10-CM

## 2020-07-12 DIAGNOSIS — Z7901 Long term (current) use of anticoagulants: Secondary | ICD-10-CM | POA: Diagnosis not present

## 2020-07-12 DIAGNOSIS — N183 Chronic kidney disease, stage 3 unspecified: Secondary | ICD-10-CM | POA: Diagnosis present

## 2020-07-12 DIAGNOSIS — R54 Age-related physical debility: Secondary | ICD-10-CM | POA: Diagnosis present

## 2020-07-12 DIAGNOSIS — R0600 Dyspnea, unspecified: Secondary | ICD-10-CM | POA: Diagnosis present

## 2020-07-12 DIAGNOSIS — R197 Diarrhea, unspecified: Secondary | ICD-10-CM | POA: Diagnosis present

## 2020-07-12 DIAGNOSIS — M549 Dorsalgia, unspecified: Secondary | ICD-10-CM | POA: Diagnosis present

## 2020-07-12 DIAGNOSIS — G8929 Other chronic pain: Secondary | ICD-10-CM | POA: Diagnosis present

## 2020-07-12 DIAGNOSIS — Z20822 Contact with and (suspected) exposure to covid-19: Secondary | ICD-10-CM | POA: Diagnosis present

## 2020-07-12 DIAGNOSIS — J9601 Acute respiratory failure with hypoxia: Secondary | ICD-10-CM

## 2020-07-12 DIAGNOSIS — Z888 Allergy status to other drugs, medicaments and biological substances status: Secondary | ICD-10-CM | POA: Diagnosis not present

## 2020-07-12 DIAGNOSIS — Z885 Allergy status to narcotic agent status: Secondary | ICD-10-CM | POA: Diagnosis not present

## 2020-07-12 DIAGNOSIS — Z86718 Personal history of other venous thrombosis and embolism: Secondary | ICD-10-CM | POA: Diagnosis not present

## 2020-07-12 DIAGNOSIS — I130 Hypertensive heart and chronic kidney disease with heart failure and stage 1 through stage 4 chronic kidney disease, or unspecified chronic kidney disease: Secondary | ICD-10-CM | POA: Diagnosis present

## 2020-07-12 DIAGNOSIS — Z86711 Personal history of pulmonary embolism: Secondary | ICD-10-CM | POA: Diagnosis not present

## 2020-07-12 DIAGNOSIS — Z79899 Other long term (current) drug therapy: Secondary | ICD-10-CM | POA: Diagnosis not present

## 2020-07-12 DIAGNOSIS — I5033 Acute on chronic diastolic (congestive) heart failure: Secondary | ICD-10-CM | POA: Diagnosis present

## 2020-07-12 DIAGNOSIS — I4821 Permanent atrial fibrillation: Secondary | ICD-10-CM | POA: Diagnosis present

## 2020-07-12 HISTORY — DX: Dilated cardiomyopathy: I42.0

## 2020-07-12 LAB — BASIC METABOLIC PANEL
Anion gap: 9 (ref 5–15)
BUN: 23 mg/dL (ref 8–23)
CO2: 28 mmol/L (ref 22–32)
Calcium: 8.7 mg/dL — ABNORMAL LOW (ref 8.9–10.3)
Chloride: 103 mmol/L (ref 98–111)
Creatinine, Ser: 1.19 mg/dL — ABNORMAL HIGH (ref 0.44–1.00)
GFR, Estimated: 45 mL/min — ABNORMAL LOW (ref >60)
Glucose, Bld: 99 mg/dL (ref 70–99)
Potassium: 3.5 mmol/L (ref 3.5–5.1)
Sodium: 140 mmol/L (ref 135–145)

## 2020-07-12 LAB — CBC
HCT: 37.9 % (ref 36.0–46.0)
Hemoglobin: 11.3 g/dL — ABNORMAL LOW (ref 12.0–15.0)
MCH: 26.6 pg (ref 26.0–34.0)
MCHC: 29.8 g/dL — ABNORMAL LOW (ref 30.0–36.0)
MCV: 89.2 fL (ref 80.0–100.0)
Platelets: 332 10*3/uL (ref 150–400)
RBC: 4.25 MIL/uL (ref 3.87–5.11)
RDW: 23.9 % — ABNORMAL HIGH (ref 11.5–15.5)
WBC: 7.1 10*3/uL (ref 4.0–10.5)
nRBC: 0 % (ref 0.0–0.2)

## 2020-07-12 LAB — SARS CORONAVIRUS 2 (TAT 6-24 HRS): SARS Coronavirus 2: NEGATIVE

## 2020-07-12 LAB — PROCALCITONIN: Procalcitonin: <0.1 ng/mL

## 2020-07-12 LAB — LACTIC ACID, PLASMA
Lactic Acid, Venous: 5.2 mmol/L (ref 0.5–1.9)
Lactic Acid, Venous: 4.4 mmol/L (ref 0.5–1.9)
Lactic Acid, Venous: 2.4 mmol/L (ref 0.5–1.9)

## 2020-07-12 LAB — STREP PNEUMONIAE URINARY ANTIGEN: Strep Pneumo Urinary Antigen: NEGATIVE

## 2020-07-12 MED ORDER — POLYETHYLENE GLYCOL 3350 17 G PO PACK
17.0000 g | PACK | Freq: Once | ORAL | Status: AC
  Administered 2020-07-12: 17 g via ORAL
  Filled 2020-07-12: qty 1

## 2020-07-12 MED ORDER — FUROSEMIDE 10 MG/ML IJ SOLN
60.0000 mg | Freq: Two times a day (BID) | INTRAMUSCULAR | Status: DC
  Administered 2020-07-13 – 2020-07-15 (×5): 60 mg via INTRAVENOUS
  Filled 2020-07-12 (×5): qty 6

## 2020-07-12 MED ORDER — SODIUM CHLORIDE 0.9 % IV SOLN
1.0000 g | INTRAVENOUS | Status: DC
  Filled 2020-07-12: qty 10

## 2020-07-12 MED ORDER — FUROSEMIDE 10 MG/ML IJ SOLN
60.0000 mg | Freq: Two times a day (BID) | INTRAMUSCULAR | Status: DC
  Administered 2020-07-12: 60 mg via INTRAVENOUS
  Filled 2020-07-12: qty 6

## 2020-07-12 MED ORDER — SODIUM CHLORIDE 0.9 % IV SOLN: 500.0000 mg | INTRAVENOUS | Status: DC

## 2020-07-12 MED ORDER — FUROSEMIDE 10 MG/ML IJ SOLN: 60.0000 mg | Freq: Two times a day (BID) | INTRAMUSCULAR | Status: DC

## 2020-07-12 NOTE — Progress Notes (Signed)
Received critical lab value. Lactic acid: 5.2 On-call A. Ghosh,MD notified. Awaiting Order.

## 2020-07-12 NOTE — Progress Notes (Signed)
PROGRESS NOTE    Katherine Lane  JGO:[id number] DOB: February 27, 1932 DOA: 07/11/2020 PCP: Babs Sciara, MD     Brief Narrative:  Katherine Lane  is a 87 y.o. WF PMHx Chronic Diastolic CHF, permanent atrial fibrillation, HTN,chronic back painand recently diagnosed PE/DVT , p  Presents ED secondaryto complaints of shortness of breath, patient was seen at cardiologist office yesterday, noted to have increased creatinine from 1.1-1.59, so her Lasix was decreased from 60 mg twice daily to 40 mg twice daily, he was sent by cardiology today for IV diuresis due to volume overload, did not receive the COVID booster vaccines on Saturday morning, reports she has been feeling achy, with some cough and feverish until Sunday evening, but report over the last 3 days, she has been having progressive dyspnea, unable to lay flat at baseline due to her chronic back pain, does report dyspnea, denies any recent fever, chills or cough, she denies any chest pain, nausea or vomiting, she had diarrhea a few days ago but nothing recent. - in ED work-up was significant for creatinine at 1.3, significantly elevated BNP at 2100, fever, no leukocytosis, chest x-ray significant for, may represent pneumonia, small right effusion as well, cardiomegaly, [facility] consulted to admit.   Subjective: A/O x4, positive SOB.  States daughter weighs her daily.  Her base weight is 110 pounds (49.8 kg).  Currently negative CP   Assessment & Plan: Covid vaccination; vaccinated 3/3   Active Problems:   Acute on chronic diastolic CHF (congestive heart failure) (HCC)   Chronic atrial fibrillation (HCC)   Acute on chronic diastolic HF (heart failure) (HCC)   Acute respiratory failure with hypoxia (HCC)   Pulmonary embolism (HCC)   Dyspnea   CKD (chronic kidney disease), stage III (HCC)   Dilated cardiomyopathy (HCC)  Acute respiratory failure with hypoxia - Most likely secondary to acute on chronic diastolic CHF.  PCXR not  convinced this is pneumonia.   -Negative - Obtain lactic acid.  Unless patient spikes a fever, has leukocytosis, positive lactic acid we will hold on antibiotics.  - Incentive spirometry - Flutter valve  Acute on chronic diastolic CHF//Dilated cardiomyopathy -Previous echocardiogram 05/30/2020 patient had retained EF with 3 other chambers moderate to severe dilation. -Strict ins and outs - Daily weight - 5/26 Cardizem 120 mg daily (hold) until we determine if patient has worsening CHF.   - 5/26 Lasix IV 60 mg BID - 5/26 echocardiogram pending - Losartan 25 mg daily - Toprol 100 mg daily  Permanent Atrial fibrillation -See CHF  -Continue Eliquis.  PE/DVT -Recent diagnosis last month, continue with Eliquis for anticoagulation  CKD stage IIIb (baseline cr 1.25-1.45) -Avoid nephrotoxic medication - Should improve with diuresis monitor closely   Patient is frail, elderly, at high risk for delirium during hospital stay, it would be very helpful if family can present with her during hospital stay, so I have discussed with staff to allow daughter to remain overnight with the patient if she wishes to do so.           Body mass index is 21.5 kg/m.     DVT prophylaxis: Eliquis Code Status: Full Family Communication:  Status is: Inpatient    Dispo: The patient is from: Home              Anticipated d/c is to: 5/30              Anticipated d/c date is: 5/30  Patient currently unstable      Consultants:    Procedures/Significant Events:  5/25 PCXR:-Patchy density in the right mid lung could represent mild bronchopneumonia.  -Small right effusion suspected.    I have personally reviewed and interpreted all radiology studies and my findings are as above.  VENTILATOR SETTINGS:    Cultures   Antimicrobials: Anti-infectives (From admission, onward)   Start     Dose/Rate Route Frequency Ordered Stop   07/12/20 1100  cefTRIAXone (ROCEPHIN) 1 g  in sodium chloride 0.9 % 100 mL IVPB        1 g 200 mL/hr over 30 Minutes Intravenous Every 24 hours 07/11/20 1514     07/11/20 1515  azithromycin (ZITHROMAX) 500 mg in sodium chloride 0.9 % 250 mL IVPB        500 mg 250 mL/hr over 60 Minutes Intravenous Every 24 hours 07/11/20 1514     07/11/20 1445  cefTRIAXone (ROCEPHIN) 1 g in sodium chloride 0.9 % 100 mL IVPB        1 g 200 mL/hr over 30 Minutes Intravenous  Once 07/11/20 1444 07/11/20 1558   07/11/20 1445  doxycycline (VIBRAMYCIN) 100 mg in sodium chloride 0.9 % 250 mL IVPB        100 mg 125 mL/hr over 120 Minutes Intravenous  Once 07/11/20 1444 07/11/20 2055       Devices    LINES / TUBES:      Continuous Infusions: . azithromycin 500 mg (07/12/20 1623)  . cefTRIAXone (ROCEPHIN)  IV Stopped (07/12/20 1248)     Objective: Vitals:   07/12/20 0401 07/12/20 0503 07/12/20 1017 07/12/20 1409  BP:  (!) 125/93 (!) 126/97 (!) 118/91  Pulse:  92  91  Resp:  18  17  Temp:  97.7 F (36.5 C)  98 F (36.7 C)  TempSrc:  Oral  Oral  SpO2:  96%  99%  Weight: 49.9 kg     Height:        Intake/Output Summary (Last 24 hours) at 07/12/2020 1729 Last data filed at 07/12/2020 1527 Gross per 24 hour  Intake 1190 ml  Output --  Net 1190 ml   Filed Weights   07/11/20 1236 07/12/20 0401  Weight: 49.9 kg 49.9 kg    Examination:  General: A/O x4, positive acute respiratory distress Eyes: negative scleral hemorrhage, negative anisocoria, negative icterus ENT: Negative Runny nose, negative gingival bleeding, Neck:  Negative scars, masses, torticollis, lymphadenopathy, JVD Lungs: decreased breath sounds bilaterally without wheezes or crackles Cardiovascular: Regular rate and rhythm without murmur gallop or rub normal S1 and S2 Abdomen: negative abdominal pain, nondistended, positive soft, bowel sounds, no rebound, no ascites, no appreciable mass Extremities: No significant cyanosis, clubbing, or edema bilateral lower  extremities Skin: Negative rashes, lesions, ulcers Psychiatric:  Negative depression, negative anxiety, negative fatigue, negative mania  Central nervous system:  Cranial nerves II through XII intact, tongue/uvula midline, all extremities muscle strength 5/5, sensation intact throughout, negative dysarthria, negative expressive aphasia, negative receptive aphasia.  .     Data Reviewed: Care during the described time interval was provided by me .  I have reviewed this patient's available data, including medical history, events of note, physical examination, and all test results as part of my evaluation.  CBC: Recent Labs  Lab 07/10/20 1119 07/11/20 1242 07/12/20 0447  WBC 6.9 5.8 7.1  NEUTROABS 4.2  --   --   HGB 11.8* 12.4 11.3*  HCT 40.2 41.8 37.9  MCV 89.5  89.9 89.2  PLT 283 334 332   Basic Metabolic Panel: Recent Labs  Lab 07/11/20 1242 07/12/20 0447  NA 138 140  K 4.0 3.5  CL 105 103  CO2 23 28  GLUCOSE 151* 99  BUN 26* 23  CREATININE 1.22* 1.19*  CALCIUM 8.9 8.7*   GFR: Estimated Creatinine Clearance: 24.8 mL/min (A) (by C-G formula based on SCr of 1.19 mg/dL (H)). Liver Function Tests: No results for input(s): AST, ALT, ALKPHOS, BILITOT, PROT, ALBUMIN in the last 168 hours. No results for input(s): LIPASE, AMYLASE in the last 168 hours. No results for input(s): AMMONIA in the last 168 hours. Coagulation Profile: No results for input(s): INR, PROTIME in the last 168 hours. Cardiac Enzymes: No results for input(s): CKTOTAL, CKMB, CKMBINDEX, TROPONINI in the last 168 hours. BNP (last 3 results) No results for input(s): PROBNP in the last 8760 hours. HbA1C: No results for input(s): HGBA1C in the last 72 hours. CBG: No results for input(s): GLUCAP in the last 168 hours. Lipid Profile: No results for input(s): CHOL, HDL, LDLCALC, TRIG, CHOLHDL, LDLDIRECT in the last 72 hours. Thyroid Function Tests: No results for input(s): TSH, T4TOTAL, FREET4, T3FREE,  THYROIDAB in the last 72 hours. Anemia Panel: No results for input(s): VITAMINB12, FOLATE, FERRITIN, TIBC, IRON, RETICCTPCT in the last 72 hours. Sepsis Labs: Recent Labs  Lab 07/12/20 0447  PROCALCITON <0.10    Recent Results (from the past 240 hour(s))  SARS CORONAVIRUS 2 (TAT 6-24 HRS) Nasopharyngeal Nasopharyngeal Swab     Status: None   Collection Time: 07/11/20  2:49 PM   Specimen: Nasopharyngeal Swab  Result Value Ref Range Status   SARS Coronavirus 2 NEGATIVE NEGATIVE Final    Comment: (NOTE) SARS-CoV-2 target nucleic acids are NOT DETECTED.  The SARS-CoV-2 RNA is generally detectable in upper and lower respiratory specimens during the acute phase of infection. Negative results do not preclude SARS-CoV-2 infection, do not rule out co-infections with other pathogens, and should not be used as the sole basis for treatment or other patient management decisions. Negative results must be combined with clinical observations, patient history, and epidemiological information. The expected result is Negative.  Fact Sheet for Patients: HairSlick.no  Fact Sheet for Healthcare Providers: quierodirigir.com  This test is not yet approved or cleared by the [place] FDA and  has been authorized for detection and/or diagnosis of SARS-CoV-2 by FDA under an Emergency Use Authorization (EUA). This EUA will remain  in effect (meaning this test can be used) for the duration of the COVID-19 declaration under Se ction 564(b)(1) of the Act, 21 U.S.C. section 360bbb-3(b)(1), unless the authorization is terminated or revoked sooner.  Performed at [facility], [address] [place], [place] [address]          Radiology Studies: DG Chest Portable 1 View  Result Date: 07/11/2020 CLINICAL DATA:  History of pulmonary emboli in congestive heart failure. Shortness of breath. EXAM: PORTABLE CHEST 1 VIEW COMPARISON:   06/06/2020 FINDINGS: Extensive artifact overlies the chest. Chronic cardiomegaly. Chronic aortic atherosclerosis and tortuosity. No evidence of pulmonary edema. New patchy density in the right mid lung could represent mild bronchopneumonia. Probable small amount of pleural fluid on the right. Multiple old augmented vertebral body fractures. IMPRESSION: Patchy density in the right mid lung could represent mild bronchopneumonia. Small right effusion suspected. Cardiomegaly and aortic atherosclerosis. No evidence of pulmonary edema. Electronically Signed   By: Paulina Fusi M.D.   On: 07/11/2020 13:47  Scheduled Meds: . apixaban  5 mg Oral BID  . diltiazem  120 mg Oral Daily  . [START ON 07/13/2020] furosemide  60 mg Intravenous BID  . losartan  25 mg Oral Daily  . metoprolol succinate  100 mg Oral Daily  . potassium chloride  10 mEq Oral Daily  . potassium chloride SA  20 mEq Oral Daily   Continuous Infusions: . azithromycin 500 mg (07/12/20 1623)  . cefTRIAXone (ROCEPHIN)  IV Stopped (07/12/20 1248)     LOS: 0 days    Time spent:40 min    Ivon Roedel, Roselind Messier, MD [facility]   If 7PM-7AM, please contact night-coverage 07/12/2020, 5:29 PM

## 2020-07-12 NOTE — Plan of Care (Signed)
  Problem: Acute Rehab PT Goals(only PT should resolve) Goal: Pt Will Go Supine/Side To Sit Outcome: Progressing Flowsheets (Taken 07/12/2020 1557) Pt will go Supine/Side to Sit: with modified independence   Problem: Acute Rehab PT Goals(only PT should resolve) Goal: Patient Will Transfer Sit To/From Stand Outcome: Progressing Flowsheets (Taken 07/12/2020 1557) Patient will transfer sit to/from stand: with supervision   Problem: Acute Rehab PT Goals(only PT should resolve) Goal: Pt Will Transfer Bed To Chair/Chair To Bed Outcome: Progressing Flowsheets (Taken 07/12/2020 1557) Pt will Transfer Bed to Chair/Chair to Bed: with supervision   Problem: Acute Rehab PT Goals(only PT should resolve) Goal: Pt Will Ambulate 07/12/2020 1557 by Ocie Bob, PT Outcome: Progressing 07/12/2020 1555 by Ocie Bob, PT Outcome: Progressing Flowsheets (Taken 07/12/2020 1555) Pt will Ambulate:  75 feet  with min guard assist  with supervision  with rolling walker   3:59 PM, 07/12/20 Ocie Bob, MPT Physical Therapist with [facility] [phone] office [phone] mobile phone

## 2020-07-12 NOTE — Progress Notes (Signed)
Received critical lab value. Lactic acid: 4.4 On-call A. Ghosh,MD notified. See new orders.

## 2020-07-12 NOTE — Progress Notes (Signed)
Progress Note  Patient Name: Katherine Lane Date of Encounter: 07/12/2020  [facility] Cardiologist: Nona Dell, MD   Subjective   Breathing improved. No chest pain or palpitations. Has been experiencing back pain which has been chronic.   Inpatient Medications    Scheduled Meds: . apixaban  5 mg Oral BID  . diltiazem  120 mg Oral Daily  . furosemide  40 mg Intravenous Q12H  . losartan  25 mg Oral Daily  . metoprolol succinate  100 mg Oral Daily  . potassium chloride  10 mEq Oral Daily  . potassium chloride SA  20 mEq Oral Daily   Continuous Infusions: . azithromycin Stopped (07/11/20 1801)  . cefTRIAXone (ROCEPHIN)  IV     PRN Meds: acetaminophen **OR** acetaminophen, albuterol, HYDROmorphone, LORazepam, zolpidem   Vital Signs    Vitals:   07/11/20 2100 07/12/20 0139 07/12/20 0401 07/12/20 0503  BP: (!) 129/98 (!) 131/98  (!) 125/93  Pulse: 77 81  92  Resp: 17 18  18   Temp:    97.7 F (36.5 C)  TempSrc:    Oral  SpO2: 98% 97%  96%  Weight:   49.9 kg   Height:        Intake/Output Summary (Last 24 hours) at 07/12/2020 1017 Last data filed at 07/11/2020 1801 Gross per 24 hour  Intake 350 ml  Output 350 ml  Net 0 ml   Last 3 Weights 07/12/2020 07/11/2020 06/19/2020  Weight (lbs) 110 lb 1.6 oz 110 lb 110 lb 12.8 oz  Weight (kg) 49.941 kg 49.896 kg 50.259 kg      Telemetry    Atrial fibrillation, HR in 80's at baseline. Into the 120's this AM but just received her medications.  - Personally Reviewed  ECG    No new tracings.   Physical Exam   GEN: Pleasant elderly female appearing in no acute distress.   Neck: No JVD Cardiac: Irregularly irregular, no murmurs, rubs, or gallops.  Respiratory: Mild rales along bases bilaterally.  GI: Soft, nontender, non-distended  MS: 1+ pitting edema; No deformity. Neuro:  Nonfocal  Psych: Normal affect   Labs    High Sensitivity Troponin:   Recent Labs  Lab 07/11/20 1625  TROPONINIHS 22*       Chemistry Recent Labs  Lab 07/11/20 1242 07/12/20 0447  NA 138 140  K 4.0 3.5  CL 105 103  CO2 23 28  GLUCOSE 151* 99  BUN 26* 23  CREATININE 1.22* 1.19*  CALCIUM 8.9 8.7*  GFRNONAA 43* 45*  ANIONGAP 10 9     Hematology Recent Labs  Lab 07/10/20 1119 07/11/20 1242 07/12/20 0447  WBC 6.9 5.8 7.1  RBC 4.49 4.65 4.25  HGB 11.8* 12.4 11.3*  HCT 40.2 41.8 37.9  MCV 89.5 89.9 89.2  MCH 26.3 26.7 26.6  MCHC 29.4* 29.7* 29.8*  RDW 23.6* 24.0* 23.9*  PLT 283 334 332    BNP Recent Labs  Lab 07/11/20 1251  BNP 2,601.0*     DDimer No results for input(s): DDIMER in the last 168 hours.   Radiology    DG Chest Portable 1 View  Result Date: 07/11/2020 CLINICAL DATA:  History of pulmonary emboli in congestive heart failure. Shortness of breath. EXAM: PORTABLE CHEST 1 VIEW COMPARISON:  06/06/2020 FINDINGS: Extensive artifact overlies the chest. Chronic cardiomegaly. Chronic aortic atherosclerosis and tortuosity. No evidence of pulmonary edema. New patchy density in the right mid lung could represent mild bronchopneumonia. Probable small amount of pleural  fluid on the right. Multiple old augmented vertebral body fractures. IMPRESSION: Patchy density in the right mid lung could represent mild bronchopneumonia. Small right effusion suspected. Cardiomegaly and aortic atherosclerosis. No evidence of pulmonary edema. Electronically Signed   By: Paulina Fusi M.D.   On: 07/11/2020 13:47    Cardiac Studies   Echocardiogram: 05/30/2020 IMPRESSIONS    1. Left ventricular ejection fraction, by estimation, is 60 to 65%. The  left ventricle has normal function. The left ventricle has no regional  wall motion abnormalities. There is mild left ventricular hypertrophy.  Left ventricular diastolic parameters  are indeterminate.  2. Right ventricular systolic function is moderately reduced. The right  ventricular size is moderately enlarged.  3. Left atrial size was moderately  dilated.  4. Right atrial size was severely dilated.  5. The mitral valve is degenerative. Trivial mitral valve regurgitation.  No evidence of mitral stenosis. Moderate mitral annular calcification.  6. Estimated PA pressure elevated and higher than estimate on echo done  09/28/19 . Tricuspid valve regurgitation is moderate.  7. The aortic valve is calcified. There is moderate calcification of the  aortic valve. Aortic valve regurgitation is mild. Mild to moderate aortic  valve sclerosis/calcification is present, without any evidence of aortic  stenosis.  8. The inferior vena cava is dilated in size with >50% respiratory  variability, suggesting right atrial pressure of 8 mmHg.   Patient Profile     87 y.o. female w/ PMH of chronic diastolic CHF, permanent atrial fibrillation, HTN,chronic back painand recently diagnosed PE/DVT (occurring in 05/2020 and anticoagulation had been held within weeks leading to this for spinal procedures) who is currently admitted with a CHF exacerbation.    Assessment & Plan    1. Acute HFpEF - She presented with worsening dyspnea, orthopnea and pitting edema following recent dose reduction of Lasix. BNP elevated to 2601 on admission and she has been started on IV Lasix 40mg  BID. Recorded output has been minimal and weight listed as being unchanged at 110 lbs but she does report improvement in her breathing. Will titrate dosing to 60mg  BID. Repeat BMET in AM.  - CXR on admission showed a patchy density in the right mid lung which could represent bronchopneumonia but also noted to have a right pleural effusion. She was started on antibiotic therapy with Azithromycin and Ceftriaxone but Procalcitonin is less  than 0.10. Will defer continuation of antibiotic therapy to the Hospitalist team.   2. Permanent Atrial Fibrillation - Rates have been elevated this AM but she just received her medications. Will follow response. Remains on Cardizem CD 120mg  daily and  Toprol-XL 100mg  daily.  - On Eliquis for anticoagulation.   3. Pulmonary Embolism/DVT - Diagnosed last month and occurred after her anticoagulation had been held for multiple spinal procedures and she was not bridged with Lovenox. Remains on Eliquis 5mg  BID for anticoagulation with Hgb stable at 11.3 and platelets at 332 this AM.   4. Stage 3 CKD - Creatinine was previously elevated to 1.59 on 07/03/2020 and her diuretic was reduced. Creatinine remains stable at 1.19 today which is close to her baseline.   5. Diarrhea - Resolved. The patient now reports having constipation and is requesting MiraLax.    For questions or updates, please contact [facility] Please consult [email or web address] for contact info under        Signed, , PA-C  07/12/2020, 10:17 AM

## 2020-07-12 NOTE — TOC Initial Note (Signed)
Transition of Care [facility]) - Initial/Assessment Note    Patient Details  Name: Katherine Lane MRN: [id number] Date of Birth: [birth year]  Transition of Care [facility]) CM/SW Contact:    Annice Needy, LCSW Phone Number: 07/12/2020, 11:29 AM  Clinical Narrative:                 Patient from home. Admitted for Acute on chronic diastolic CHF. Patient eats a low sodium heart healthy diet. Daughter, [name], takes patient's weights daily.  Patient will admit to [facility] next Tuesday. Robin and Patient are interested in establishing medical POA. POA paperwork dropped off in patient's room per Robin's request.   Expected Discharge Plan: Home/Self Care Barriers to Discharge: Continued Medical Work up   Patient Goals and CMS Choice Patient states their goals for this hospitalization and ongoing recovery are:: Patient will admit to [facility] on Tuesday.      Expected Discharge Plan and Services Expected Discharge Plan: Home/Self Care       Living arrangements for the past 2 months: Single Family Home                                      Prior Living Arrangements/Services Living arrangements for the past 2 months: Single Family Home Lives with:: Self Patient language and need for interpreter reviewed:: Yes        Need for Family Participation in Patient Care: Yes (Comment) Care giver support system in place?: Yes (comment)   Criminal Activity/Legal Involvement Pertinent to Current Situation/Hospitalization: No - Comment as needed  Activities of Daily Living Home Assistive Devices/Equipment: Walker (specify type),Bedside commode/3-in-1,Grab bars around toilet,Grab bars in shower,Raised toilet seat with rails,Shower chair with back ADL Screening (condition at time of admission) Patient's cognitive ability adequate to safely complete daily activities?: Yes Is the patient deaf or have difficulty hearing?: Yes Does the patient have difficulty seeing, even when wearing  glasses/contacts?: No Does the patient have difficulty concentrating, remembering, or making decisions?: No Patient able to express need for assistance with ADLs?: Yes Does the patient have difficulty dressing or bathing?: Yes Independently performs ADLs?: No Communication: Independent Dressing (OT): Needs assistance Is this a change from baseline?: Pre-admission baseline Grooming: Needs assistance Is this a change from baseline?: Pre-admission baseline Feeding: Independent Bathing: Needs assistance Is this a change from baseline?: Pre-admission baseline Toileting: Needs assistance Is this a change from baseline?: Pre-admission baseline In/Out Bed: Needs assistance Is this a change from baseline?: Pre-admission baseline Walks in Home: Needs assistance Is this a change from baseline?: Pre-admission baseline Does the patient have difficulty walking or climbing stairs?: Yes Weakness of Legs: Both Weakness of Arms/Hands: None  Permission Sought/Granted Permission sought to share information with : Family Supports    Share Information with NAME: Dionicia Abler     Permission granted to share info w Relationship: daughter     Emotional Assessment     Affect (typically observed): Appropriate Orientation: : Oriented to Self,Oriented to Place,Oriented to Situation Alcohol / Substance Use: Not Applicable Psych Involvement: No (comment)  Admission diagnosis:  Dyspnea [R06.00] Acute on chronic diastolic congestive heart failure (HCC) [I50.33] Community acquired pneumonia of right middle lobe of lung [J18.9] Patient Active Problem List   Diagnosis Date Noted  . Dyspnea 07/11/2020  . Acute respiratory failure with hypoxia (HCC)   . Diarrhea   . Pulmonary embolism (HCC)   . Acute on chronic  diastolic HF (heart failure) (HCC) 05/30/2020  . Thoracic vertebral fracture (HCC) 04/20/2020  . Chronic atrial fibrillation (HCC) 02/21/2020  . Anticoagulated 02/21/2020  . Hypokalemia  09/27/2019  . Bilateral pleural effusion 09/26/2019  . Age-related osteoporosis without current pathological fracture 06/10/2019  . Hemorrhoids, external, with complication 05/05/2019  . Rectal bleeding   . Rectal pain   . Hemorrhoids, internal, with bleeding 01/06/2018  . Hypertensive urgency 12/16/2017  . Intractable episodic headache   . Visual changes   . Depression with anxiety   . Chronic diastolic congestive heart failure (HCC) 10/31/2017  . Atypical chest pain 10/31/2017  . Compression fracture of L1 lumbar vertebra (HCC) 02/19/2017  . Lumbar compression fracture (HCC) 12/26/2016  . Gastroesophageal reflux disease without esophagitis 07/18/2016  . Acute on chronic diastolic CHF (congestive heart failure) (HCC) 07/11/2016  . Chronic coughing 07/11/2016  . Migraine headache 07/01/2016  . Anxiety 06/30/2016  . Altered mental status 12/06/2015  . Prolonged Q-T interval on ECG 12/06/2015  . Postural dizziness with presyncope 12/06/2015  . PAF (paroxysmal atrial fibrillation) (HCC) 07/14/2013  . Atrial flutter (HCC) 05/12/2013  . Bulging discs 04/20/2013  . Trigeminal neuralgia 01/15/2013  . Cellulitis 07/31/2011  . Mechanical complication of internal orthopedic implant (HCC) 07/21/2011  . 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 Ankle fracture 07/02/2010  . LESION OF PLANTAR NERVE 05/14/2009  . CLOSED BIMALLEOLAR FRACTURE 03/01/2009  . Essential hypertension 11/07/2008  . CONSTIPATION, CHRONIC 11/07/2008  . Osteoporosis 11/07/2008   PCP:  Babs Sciara, MD Pharmacy:   [facility] - [place], [place] - [phone] [place] [address] Phone: [phone] Fax: [phone]     Social Determinants of Health (SDOH) Interventions    Readmission Risk Interventions No flowsheet data found.

## 2020-07-12 NOTE — Progress Notes (Signed)
Received referral from Nursing that patient would like to complete her Healthcare Power of [place]. Made visit to patient and found her sitting upright in her recliner with her daughter [name] bedside. Chaplain engaged Ms. Valeri and [name] in conversation around subjects related to [employer] and Living Will. She elected to complete the HCPOA and to defer to fill out the Living Will at this time. Chaplain assisted in securing volunteers and notary to complete the document. Copy placed in chart for scanning and original and 2 copies given to patient. Chaplain provided spiritual support and prayer today as well. Will remain available in order to provide spiritual support and to assess for spiritual need.

## 2020-07-12 NOTE — Evaluation (Signed)
Physical Therapy Evaluation Patient Details Name: Katherine Lane MRN: [id number] DOB: [birth year] Today's Date: 07/12/2020   History of Present Illness  Katherine Lane  is a 87 y.o. female, with past medical history of chronic diastolic CHF, permanent atrial fibrillation, HTN, chronic back pain and recently diagnosed PE/DVT , patient presents ED secondary to complaints of shortness of breath, patient was seen at cardiologist office yesterday, noted to have increased creatinine from 1.1-1.59, so her Lasix was decreased from 60 mg twice daily to 40 mg twice daily, he was sent by cardiology today for IV diuresis due to volume overload, did not receive the COVID booster vaccines on Saturday morning, reports she has been feeling achy, with some cough and feverish until Sunday evening, but report over the last 3 days, she has been having progressive dyspnea, unable to lay flat at baseline due to her chronic back pain, does report dyspnea, denies any recent fever, chills or cough, she denies any chest pain, nausea or vomiting, she had diarrhea a few days ago but nothing recent.  - in ED work-up was significant for creatinine at 1.3, significantly elevated BNP at 2100, fever, no leukocytosis, chest x-ray significant for, may represent pneumonia, small right effusion as well, cardiomegaly, [facility] hospitalist consulted to admit.    Clinical Impression  Patient limited for lying flat in bed due to c/o severe pain in spine, demonstrates good return for using bed rail to sit up at bedside, slow labored cadence with SpO2 dropping from 95% to 85%, but recovered to 90% while sitting in chair on room air, limited for gait training mostly due to fatigue and SOB.  Patient tolerated sitting up in chair after therapy - nursing staff aware.  Patient will benefit from continued physical therapy in hospital and recommended venue below to increase strength, balance, endurance for safe ADLs and gait.     Follow Up Recommendations  Home health PT;Supervision/Assistance - 24 hour;Supervision - Intermittent    Equipment Recommendations  None recommended by PT    Recommendations for Other Services       Precautions / Restrictions Precautions Precautions: Fall Restrictions Weight Bearing Restrictions: No      Mobility  Bed Mobility Overal bed mobility: Needs Assistance Bed Mobility: Supine to Sit     Supine to sit: Min guard     General bed mobility comments: Patient required use of bed rails    Transfers Overall transfer level: Needs assistance Equipment used: Rolling walker (2 wheeled) Transfers: Sit to/from Stand Sit to Stand: Supervision;Min guard            Ambulation/Gait Ambulation/Gait assistance: Supervision;Min guard Gait Distance (Feet): 25 Feet Assistive device: Rolling walker (2 wheeled) Gait Pattern/deviations: Decreased step length - right;Decreased step length - left;Decreased stride length Gait velocity: decreased   General Gait Details: slow labored cadence without loss of balance, frequent standing rest breaks due to SOB with SpO2 dropping from 95% to 85%, limited mostly due to fatigue  Stairs            Wheelchair Mobility    Modified Rankin (Stroke Patients Only)       Balance Overall balance assessment: Needs assistance Sitting-balance support: Feet unsupported;Feet supported Sitting balance-Leahy Scale: Good Sitting balance - Comments: Is able to maintain balance at EOB Postural control: Right lateral lean Standing balance support: Bilateral upper extremity supported Standing balance-Leahy Scale: Poor Standing balance comment: requires RW  Pertinent Vitals/Pain Pain Assessment: 0-10 Pain Score: 6  Pain Location: whole back do to past history of back surgeries Pain Descriptors / Indicators: Aching;Sore;Guarding;Moaning Pain Intervention(s): Limited activity within patient's tolerance;Monitored during  session;Patient requesting pain meds-RN notified    Home Living Family/patient expects to be discharged to:: Private residence Living Arrangements: Alone Available Help at Discharge: Family;Friend(s);Personal care attendant;Available PRN/intermittently Type of Home: House Home Access: Stairs to enter Entrance Stairs-Rails: Left;Right;Can reach both Entrance Stairs-Number of Steps: 2 Home Layout: One level Home Equipment: Cane - single point;Toilet riser;Shower seat;Bedside commode;Grab bars - tub/shower;Hand held shower head;Adaptive equipment;Other (comment);Walker - 4 wheels;Wheelchair - manual Additional Comments: Patinet reports of living with family who can help as needed    Prior Function Level of Independence: Needs assistance   Gait / Transfers Assistance Needed: Tour manager  ADL's / Homemaking Assistance Needed: Assisted by family        Hand Dominance   Dominant Hand: Right    Extremity/Trunk Assessment   Upper Extremity Assessment Upper Extremity Assessment: Generalized weakness    Lower Extremity Assessment Lower Extremity Assessment: Generalized weakness    Cervical / Trunk Assessment Cervical / Trunk Assessment: Normal  Communication   Communication: No difficulties  Cognition Arousal/Alertness: Awake/alert Behavior During Therapy: WFL for tasks assessed/performed Overall Cognitive Status: Within Functional Limits for tasks assessed                                        General Comments      Exercises     Assessment/Plan    PT Assessment Patient needs continued PT services  PT Problem List Decreased strength;Decreased activity tolerance;Decreased balance;Decreased mobility       PT Treatment Interventions DME instruction;Gait training;Stair training;Functional mobility training;Therapeutic activities;Therapeutic exercise;Balance training;Patient/family education    PT Goals (Current goals can be found  in the Care Plan section)  Acute Rehab PT Goals Patient Stated Goal: return home before moving into ALF PT Goal Formulation: With patient Time For Goal Achievement: 07/26/20 Potential to Achieve Goals: Good    Frequency Min 3X/week   Barriers to discharge        Co-evaluation               AM-PAC PT "6 Clicks" Mobility  Outcome Measure Help needed turning from your back to your side while in a flat bed without using bedrails?: A Little Help needed moving from lying on your back to sitting on the side of a flat bed without using bedrails?: A Little Help needed moving to and from a bed to a chair (including a wheelchair)?: A Little Help needed standing up from a chair using your arms (e.g., wheelchair or bedside chair)?: A Little Help needed to walk in hospital room?: A Little Help needed climbing 3-5 steps with a railing? : A Lot 6 Click Score: 17    End of Session   Activity Tolerance: Patient tolerated treatment well;Patient limited by fatigue;Patient limited by pain Patient left: in chair;with call bell/phone within reach Nurse Communication: Mobility status;Precautions PT Visit Diagnosis: Unsteadiness on feet (R26.81);Other abnormalities of gait and mobility (R26.89);Muscle weakness (generalized) (M62.81)    Time: 3354-5625 PT Time Calculation (min) (ACUTE ONLY): 24 min   Charges:   PT Evaluation $PT Eval Moderate Complexity: 1 Mod PT Treatments $Therapeutic Activity: 23-37 mins        3:53 PM, 07/12/20 Ocie Bob, MPT Physical Therapist  with [facility] 336 [phone] office [phone] mobile phone

## 2020-07-12 NOTE — Plan of Care (Signed)
  Problem: Education: Goal: Knowledge of General Education information will improve Description Including pain rating scale, medication(s)/side effects and non-pharmacologic comfort measures Outcome: Progressing   Problem: Health Behavior/Discharge Planning: Goal: Ability to manage health-related needs will improve Outcome: Progressing   

## 2020-07-13 ENCOUNTER — Inpatient Hospital Stay (HOSPITAL_COMMUNITY): Payer: Medicare PPO

## 2020-07-13 DIAGNOSIS — I482 Chronic atrial fibrillation, unspecified: Secondary | ICD-10-CM

## 2020-07-13 DIAGNOSIS — I5033 Acute on chronic diastolic (congestive) heart failure: Secondary | ICD-10-CM

## 2020-07-13 DIAGNOSIS — I420 Dilated cardiomyopathy: Secondary | ICD-10-CM | POA: Diagnosis not present

## 2020-07-13 DIAGNOSIS — N1832 Chronic kidney disease, stage 3b: Secondary | ICD-10-CM | POA: Diagnosis not present

## 2020-07-13 LAB — COMPREHENSIVE METABOLIC PANEL
ALT: 17 U/L (ref 0–44)
AST: 25 U/L (ref 15–41)
Albumin: 3 g/dL — ABNORMAL LOW (ref 3.5–5.0)
Alkaline Phosphatase: 153 U/L — ABNORMAL HIGH (ref 38–126)
Anion gap: 7 (ref 5–15)
BUN: 26 mg/dL — ABNORMAL HIGH (ref 8–23)
CO2: 27 mmol/L (ref 22–32)
Calcium: 8.4 mg/dL — ABNORMAL LOW (ref 8.9–10.3)
Chloride: 101 mmol/L (ref 98–111)
Creatinine, Ser: 1.33 mg/dL — ABNORMAL HIGH (ref 0.44–1.00)
GFR, Estimated: 39 mL/min — ABNORMAL LOW (ref >60)
Glucose, Bld: 86 mg/dL (ref 70–99)
Potassium: 4.6 mmol/L (ref 3.5–5.1)
Sodium: 135 mmol/L (ref 135–145)
Total Bilirubin: 1.1 mg/dL (ref 0.3–1.2)
Total Protein: 5.9 g/dL — ABNORMAL LOW (ref 6.5–8.1)

## 2020-07-13 LAB — LEGIONELLA PNEUMOPHILA SEROGP 1 UR AG: L. pneumophila Serogp 1 Ur Ag: NEGATIVE

## 2020-07-13 LAB — CBC WITH DIFFERENTIAL/PLATELET
Abs Immature Granulocytes: 0.04 10*3/uL (ref 0.00–0.07)
Basophils Absolute: 0 10*3/uL (ref 0.0–0.1)
Basophils Relative: 1 %
Eosinophils Absolute: 0.2 10*3/uL (ref 0.0–0.5)
Eosinophils Relative: 4 %
HCT: 37.3 % (ref 36.0–46.0)
Hemoglobin: 11 g/dL — ABNORMAL LOW (ref 12.0–15.0)
Immature Granulocytes: 1 %
Lymphocytes Relative: 20 %
Lymphs Abs: 1.2 10*3/uL (ref 0.7–4.0)
MCH: 26.4 pg (ref 26.0–34.0)
MCHC: 29.5 g/dL — ABNORMAL LOW (ref 30.0–36.0)
MCV: 89.7 fL (ref 80.0–100.0)
Monocytes Absolute: 0.9 10*3/uL (ref 0.1–1.0)
Monocytes Relative: 15 %
Neutro Abs: 3.6 10*3/uL (ref 1.7–7.7)
Neutrophils Relative %: 59 %
Platelets: 333 10*3/uL (ref 150–400)
RBC: 4.16 MIL/uL (ref 3.87–5.11)
RDW: 22.9 % — ABNORMAL HIGH (ref 11.5–15.5)
WBC: 6.1 10*3/uL (ref 4.0–10.5)
nRBC: 0 % (ref 0.0–0.2)

## 2020-07-13 LAB — PROCALCITONIN: Procalcitonin: <0.1 ng/mL

## 2020-07-13 LAB — LACTIC ACID, PLASMA: Lactic Acid, Venous: 1.6 mmol/L (ref 0.5–1.9)

## 2020-07-13 LAB — PHOSPHORUS: Phosphorus: 4 mg/dL (ref 2.5–4.6)

## 2020-07-13 LAB — MAGNESIUM: Magnesium: 2.2 mg/dL (ref 1.7–2.4)

## 2020-07-13 NOTE — Plan of Care (Signed)
  Problem: Education: Goal: Knowledge of General Education information will improve Description: Including pain rating scale, medication(s)/side effects and non-pharmacologic comfort measures Outcome: Progressing   Problem: Clinical Measurements: Goal: Will remain free from infection Outcome: Progressing   

## 2020-07-13 NOTE — Progress Notes (Signed)
PROGRESS NOTE    Katherine Lane  ZLD:[id number] DOB: [birth year] DOA: 07/11/2020 PCP: Babs Sciara, MD     Brief Narrative:  Katherine Lane  is a 87 y.o. WF PMHx Chronic Diastolic CHF, permanent atrial fibrillation, HTN,chronic back painand recently diagnosed PE/DVT , p  Presents ED secondaryto complaints of shortness of breath, patient was seen at cardiologist office yesterday, noted to have increased creatinine from 1.1-1.59, so her Lasix was decreased from 60 mg twice daily to 40 mg twice daily, he was sent by cardiology today for IV diuresis due to volume overload, did not receive the COVID booster vaccines on Saturday morning, reports she has been feeling achy, with some cough and feverish until Sunday evening, but report over the last 3 days, she has been having progressive dyspnea, unable to lay flat at baseline due to her chronic back pain, does report dyspnea, denies any recent fever, chills or cough, she denies any chest pain, nausea or vomiting, she had diarrhea a few days ago but nothing recent. - in ED work-up was significant for creatinine at 1.3, significantly elevated BNP at 2100, fever, no leukocytosis, chest x-ray significant for, may represent pneumonia, small right effusion as well, cardiomegaly, [facility] hospitalist consulted to admit.   Subjective: Pt reports still doesn' t feel well and not urinating very much on lasix.  Her base weight is 110 pounds (49.8 kg).  Currently negative CP Filed Weights   07/13/20 0500 07/13/20 1417 07/13/20 1442  Weight: 51 kg 50.5 kg 50.6 kg   Assessment & Plan: Covid vaccination; vaccinated 3/3   Active Problems:   Acute on chronic diastolic CHF (congestive heart failure) (HCC)   Chronic atrial fibrillation (HCC)   Acute on chronic diastolic HF (heart failure) (HCC)   Acute respiratory failure with hypoxia (HCC)   Pulmonary embolism (HCC)   Dyspnea   CKD (chronic kidney disease), stage III (HCC)   Dilated cardiomyopathy  (HCC)  Acute respiratory failure with hypoxia - Most likely secondary to acute on chronic diastolic CHF.  PCXR not convinced this is pneumonia.   -Negative - Obtain lactic acid.  Unless patient spikes a fever, has leukocytosis, positive lactic acid we will hold on antibiotics.  - Incentive spirometry - Flutter valve - DC antibiotics, pneumonia ruled out  Acute on chronic diastolic CHF//Dilated cardiomyopathy -Previous echocardiogram 05/30/2020 patient had retained EF with 3 other chambers moderate to severe dilation. -Strict ins and outs - Daily weight - 5/26 Cardizem 120 mg daily (hold) until we determine if patient has worsening CHF.   - 5/26 Lasix IV 60 mg BID - continue - 5/26 echocardiogram - Losartan 25 mg daily - Toprol 100 mg daily  Permanent Atrial fibrillation -See CHF  -Continue Eliquis.  PE/DVT -Recent diagnosis last month, continue with Eliquis for anticoagulation  CKD stage IIIb (baseline cr 1.25-1.45) -Avoid nephrotoxic medication - Should improve with diuresis monitor closely  Patient is frail, elderly, at high risk for delirium during hospital stay, it would be very helpful if family can present with her during hospital stay, so I have discussed with staff to allow daughter to remain overnight with the patient if she wishes to do so.    Body mass index is 21.78 kg/m.  DVT prophylaxis: Eliquis Code Status: Full Family Communication: bedside update Status is: Inpatient    Dispo: The patient is from: Home              Anticipated d/c is to: 5/30  Anticipated d/c date is: 5/30              Patient currently unstable  Consultants:    Procedures/Significant Events:  5/25 PCXR:-Patchy density in the right mid lung could represent mild bronchopneumonia.  -Small right effusion suspected.  I have personally reviewed and interpreted all radiology studies and my findings are as above.  VENTILATOR  SETTINGS:    Cultures   Antimicrobials: Anti-infectives (From admission, onward)   Start     Dose/Rate Route Frequency Ordered Stop   07/13/20 1600  azithromycin (ZITHROMAX) 500 mg in sodium chloride 0.9 % 250 mL IVPB  Status:  Discontinued        500 mg 250 mL/hr over 60 Minutes Intravenous Every 24 hours 07/12/20 2150 07/13/20 0810   07/13/20 1000  cefTRIAXone (ROCEPHIN) 1 g in sodium chloride 0.9 % 100 mL IVPB  Status:  Discontinued        1 g 200 mL/hr over 30 Minutes Intravenous Every 24 hours 07/12/20 2150 07/13/20 0810   07/12/20 1100  cefTRIAXone (ROCEPHIN) 1 g in sodium chloride 0.9 % 100 mL IVPB  Status:  Discontinued        1 g 200 mL/hr over 30 Minutes Intravenous Every 24 hours 07/11/20 1514 07/12/20 1733   07/11/20 1515  azithromycin (ZITHROMAX) 500 mg in sodium chloride 0.9 % 250 mL IVPB  Status:  Discontinued        500 mg 250 mL/hr over 60 Minutes Intravenous Every 24 hours 07/11/20 1514 07/12/20 1733   07/11/20 1445  cefTRIAXone (ROCEPHIN) 1 g in sodium chloride 0.9 % 100 mL IVPB        1 g 200 mL/hr over 30 Minutes Intravenous  Once 07/11/20 1444 07/11/20 1558   07/11/20 1445  doxycycline (VIBRAMYCIN) 100 mg in sodium chloride 0.9 % 250 mL IVPB        100 mg 125 mL/hr over 120 Minutes Intravenous  Once 07/11/20 1444 07/11/20 2055       Devices    LINES / TUBES:    Continuous Infusions:   Objective: Vitals:   07/13/20 0836 07/13/20 1335 07/13/20 1417 07/13/20 1442  BP: 129/88 98/75    Pulse: 71 94    Resp:  17    Temp:  98.2 F (36.8 C)    TempSrc:      SpO2:  100%    Weight:   50.5 kg 50.6 kg  Height:        Intake/Output Summary (Last 24 hours) at 07/13/2020 1848 Last data filed at 07/13/2020 0900 Gross per 24 hour  Intake 240 ml  Output 300 ml  Net -60 ml   Filed Weights   07/13/20 0500 07/13/20 1417 07/13/20 1442  Weight: 51 kg 50.5 kg 50.6 kg    Examination:  Physical Exam Vitals and nursing note reviewed.  Constitutional:       Appearance: Normal appearance. She is normal weight.  HENT:     Head: Normocephalic and atraumatic.     Nose: Nose normal. No congestion or rhinorrhea.     Mouth/Throat:     Mouth: Mucous membranes are moist.     Pharynx: No oropharyngeal exudate.  Eyes:     Extraocular Movements: Extraocular movements intact.  Cardiovascular:     Rate and Rhythm: Normal rate.     Pulses: Normal pulses.  Pulmonary:     Effort: Pulmonary effort is normal. No respiratory distress.     Breath sounds: No stridor. No rhonchi.  Abdominal:  General: Bowel sounds are normal. There is no distension.     Palpations: Abdomen is soft. There is no mass.  Musculoskeletal:        General: Normal range of motion.     Cervical back: Normal range of motion and neck supple.  Skin:    General: Skin is warm.  Neurological:     General: No focal deficit present.     Mental Status: She is alert.  Psychiatric:        Mood and Affect: Mood normal.     Data Reviewed: Care during the described time interval was provided by me .  I have reviewed this patient's available data, including medical history, events of note, physical examination, and all test results as part of my evaluation.  CBC: Recent Labs  Lab 07/10/20 1119 07/11/20 1242 07/12/20 0447 07/13/20 0548  WBC 6.9 5.8 7.1 6.1  NEUTROABS 4.2  --   --  3.6  HGB 11.8* 12.4 11.3* 11.0*  HCT 40.2 41.8 37.9 37.3  MCV 89.5 89.9 89.2 89.7  PLT 283 334 332 333   Basic Metabolic Panel: Recent Labs  Lab 07/11/20 1242 07/12/20 0447 07/13/20 0548  NA 138 140 135  K 4.0 3.5 4.6  CL 105 103 101  CO2 23 28 27   GLUCOSE 151* 99 86  BUN 26* 23 26*  CREATININE 1.22* 1.19* 1.33*  CALCIUM 8.9 8.7* 8.4*  MG  --   --  2.2  PHOS  --   --  4.0   GFR: Estimated Creatinine Clearance: 22.2 mL/min (A) (by C-G formula based on SCr of 1.33 mg/dL (H)). Liver Function Tests: Recent Labs  Lab 07/13/20 0548  AST 25  ALT 17  ALKPHOS 153*  BILITOT 1.1  PROT 5.9*   ALBUMIN 3.0*   No results for input(s): LIPASE, AMYLASE in the last 168 hours. No results for input(s): AMMONIA in the last 168 hours. Coagulation Profile: No results for input(s): INR, PROTIME in the last 168 hours. Cardiac Enzymes: No results for input(s): CKTOTAL, CKMB, CKMBINDEX, TROPONINI in the last 168 hours. BNP (last 3 results) No results for input(s): PROBNP in the last 8760 hours. HbA1C: No results for input(s): HGBA1C in the last 72 hours. CBG: No results for input(s): GLUCAP in the last 168 hours. Lipid Profile: No results for input(s): CHOL, HDL, LDLCALC, TRIG, CHOLHDL, LDLDIRECT in the last 72 hours. Thyroid Function Tests: No results for input(s): TSH, T4TOTAL, FREET4, T3FREE, THYROIDAB in the last 72 hours. Anemia Panel: No results for input(s): VITAMINB12, FOLATE, FERRITIN, TIBC, IRON, RETICCTPCT in the last 72 hours. Sepsis Labs: Recent Labs  Lab 07/12/20 0447 07/12/20 1804 07/12/20 2038 07/12/20 2254 07/13/20 0152 07/13/20 0548  PROCALCITON <0.10  --   --   --   --  <0.10  LATICACIDVEN  --  5.2* 4.4* 2.4* 1.6  --     Recent Results (from the past 240 hour(s))  SARS CORONAVIRUS 2 (TAT 6-24 HRS) Nasopharyngeal Nasopharyngeal Swab     Status: None   Collection Time: 07/11/20  2:49 PM   Specimen: Nasopharyngeal Swab  Result Value Ref Range Status   SARS Coronavirus 2 NEGATIVE NEGATIVE Final    Comment: (NOTE) SARS-CoV-2 target nucleic acids are NOT DETECTED.  The SARS-CoV-2 RNA is generally detectable in upper and lower respiratory specimens during the acute phase of infection. Negative results do not preclude SARS-CoV-2 infection, do not rule out co-infections with other pathogens, and should not be used as the sole basis for  treatment or other patient management decisions. Negative results must be combined with clinical observations, patient history, and epidemiological information. The expected result is Negative.  Fact Sheet for  Patients: HairSlick.nohttps://www.fda.gov/media/138098/download  Fact Sheet for Healthcare Providers: quierodirigir.comhttps://www.fda.gov/media/138095/download  This test is not yet approved or cleared by the [place] FDA and  has been authorized for detection and/or diagnosis of SARS-CoV-2 by FDA under an Emergency Use Authorization (EUA). This EUA will remain  in effect (meaning this test can be used) for the duration of the COVID-19 declaration under Se ction 564(b)(1) of the Act, 21 U.S.C. section 360bbb-3(b)(1), unless the authorization is terminated or revoked sooner.  Performed at [facility], [address] [place], [place] [address]      Radiology Studies: DG CHEST PORT 1 VIEW  Result Date: 07/13/2020 CLINICAL DATA:  Severe upper back pain. Intermittent shortness of breath. EXAM: PORTABLE CHEST 1 VIEW COMPARISON:  07/11/2020 FINDINGS: Stable calcifications or high-density material in the right lower chest. Patient has bone cement in multiple vertebral bodies. Heart and mediastinum are stable. Coarse lung markings are chronic. No focal lung disease. Limited evaluation of the thoracic vertebral bodies on this single view. IMPRESSION: Chronic lung changes without acute findings. Electronically Signed   By: Richarda OverlieAdam  Henn M.D.   On: 07/13/2020 12:49    Scheduled Meds: . apixaban  5 mg Oral BID  . furosemide  60 mg Intravenous BID  . losartan  25 mg Oral Daily  . metoprolol succinate  100 mg Oral Daily  . potassium chloride  10 mEq Oral Daily  . potassium chloride SA  20 mEq Oral Daily   Continuous Infusions:    LOS: 1 day    Time spent:40 min    Standley Dakinslanford Quest Tavenner, MD [facility]   If 7PM-7AM, please contact night-coverage 07/13/2020, 6:48 PM

## 2020-07-13 NOTE — Progress Notes (Signed)
Physical Therapy Treatment Patient Details Name: DORINDA STEHR MRN: [id number] DOB: 14-Dec-1932 Today's Date: 07/13/2020    History of Present Illness Shona Pardo  is a 87 y.o. female, with past medical history of chronic diastolic CHF, permanent atrial fibrillation, HTN, chronic back pain and recently diagnosed PE/DVT , patient presents ED secondary to complaints of shortness of breath, patient was seen at cardiologist office yesterday, noted to have increased creatinine from 1.1-1.59, so her Lasix was decreased from 60 mg twice daily to 40 mg twice daily, he was sent by cardiology today for IV diuresis due to volume overload, did not receive the COVID booster vaccines on Saturday morning, reports she has been feeling achy, with some cough and feverish until Sunday evening, but report over the last 3 days, she has been having progressive dyspnea, unable to lay flat at baseline due to her chronic back pain, does report dyspnea, denies any recent fever, chills or cough, she denies any chest pain, nausea or vomiting, she had diarrhea a few days ago but nothing recent.  - in ED work-up was significant for creatinine at 1.3, significantly elevated BNP at 2100, fever, no leukocytosis, chest x-ray significant for, may represent pneumonia, small right effusion as well, cardiomegaly, [facility] hospitalist consulted to admit.    PT Comments    Patient with nursing staff at beginning of session. Patient transitions to seated EOB with labored movements with c/o pain throughout. Patient demonstrates good sitting balance EOB and requires HHA to transfer to standing. Patient requires HHA to remain standing and reaches for nearby objects/walls for support. She ambulates to scale and around in room with HHA and unsteady cadence reaching for walls/objects for balance. Patient limited today by c/o L rib pain and is given cueing for breathing for relaxation. Patient assisted with repositioning in bed. Patient will benefit  from continued physical therapy in hospital and recommended venue below to increase strength, balance, endurance for safe ADLs and gait.    Follow Up Recommendations  Home health PT;Supervision/Assistance - 24 hour;Supervision - Intermittent     Equipment Recommendations  None recommended by PT    Recommendations for Other Services       Precautions / Restrictions Precautions Precautions: Fall Restrictions Weight Bearing Restrictions: No    Mobility  Bed Mobility Overal bed mobility: Needs Assistance Bed Mobility: Supine to Sit     Supine to sit: Min guard     General bed mobility comments: Patient required use of bed rails    Transfers Overall transfer level: Needs assistance Equipment used: 1 person hand held assist Transfers: Sit to/from Stand Sit to Stand: Supervision;Min guard         General transfer comment: transfer to standing with HHA, and reaching for nearby objects, unsteady upon initial standing  Ambulation/Gait Ambulation/Gait assistance: Supervision;Min guard   Assistive device: 1 person hand held assist Gait Pattern/deviations: Decreased step length - right;Decreased step length - left;Decreased stride length;Trunk flexed Gait velocity: decreased   General Gait Details: slow labored cadence without loss of balance, reaching for nearby objects/walls for support   Stairs             Wheelchair Mobility    Modified Rankin (Stroke Patients Only)       Balance Overall balance assessment: Needs assistance Sitting-balance support: Feet unsupported;Feet supported Sitting balance-Leahy Scale: Good Sitting balance - Comments: Is able to maintain balance at EOB Postural control: Right lateral lean Standing balance support: Bilateral upper extremity supported Standing balance-Leahy Scale: Poor Standing balance  comment: requires HHA                            Cognition Arousal/Alertness: Awake/alert Behavior During Therapy:  WFL for tasks assessed/performed Overall Cognitive Status: Within Functional Limits for tasks assessed                                        Exercises      General Comments        Pertinent Vitals/Pain Pain Assessment: Faces Faces Pain Scale: Hurts whole lot Pain Location: whole back do to past history of back surgeries and L ribs Pain Descriptors / Indicators: Aching;Sore;Guarding;Moaning Pain Intervention(s): Limited activity within patient's tolerance;Monitored during session;Premedicated before session;Repositioned;Relaxation    Home Living                      Prior Function            PT Goals (current goals can now be found in the care plan section) Acute Rehab PT Goals Patient Stated Goal: return home before moving into ALF PT Goal Formulation: With patient Time For Goal Achievement: 07/26/20 Potential to Achieve Goals: Good Progress towards PT goals: Progressing toward goals    Frequency    Min 3X/week      PT Plan Current plan remains appropriate    Co-evaluation              AM-PAC PT "6 Clicks" Mobility   Outcome Measure  Help needed turning from your back to your side while in a flat bed without using bedrails?: A Little Help needed moving from lying on your back to sitting on the side of a flat bed without using bedrails?: A Little Help needed moving to and from a bed to a chair (including a wheelchair)?: A Little Help needed standing up from a chair using your arms (e.g., wheelchair or bedside chair)?: A Little Help needed to walk in hospital room?: A Little Help needed climbing 3-5 steps with a railing? : A Lot 6 Click Score: 17    End of Session   Activity Tolerance: Patient tolerated treatment well;Patient limited by fatigue;Patient limited by pain Patient left: in chair;with call bell/phone within reach Nurse Communication: Mobility status PT Visit Diagnosis: Unsteadiness on feet (R26.81);Other  abnormalities of gait and mobility (R26.89);Muscle weakness (generalized) (M62.81)     Time: 5277-8242 PT Time Calculation (min) (ACUTE ONLY): 13 min  Charges:  $Therapeutic Activity: 8-22 mins                     3:03 PM, 07/13/20 Wyman Songster PT, DPT Physical Therapist at [facility]

## 2020-07-13 NOTE — Care Management Important Message (Signed)
Important Message  Patient Details  Name: Katherine Lane MRN: [id number] Date of Birth: 04/07/1932   Medicare Important Message Given:  Yes     Corey Harold 07/13/2020, 11:54 AM

## 2020-07-13 NOTE — Progress Notes (Addendum)
Progress Note  Patient Name: Katherine Lane Date of Encounter: 07/13/2020  [facility] Cardiologist: Nona Dell, MD   Subjective   Breathing improved this AM but had some dyspnea yesterday and overnight. Back on supplemental oxygen this AM. No chest pain or palpitations. Loose stools following consumption of breakfast.   Inpatient Medications    Scheduled Meds: . apixaban  5 mg Oral BID  . furosemide  60 mg Intravenous BID  . losartan  25 mg Oral Daily  . metoprolol succinate  100 mg Oral Daily  . potassium chloride  10 mEq Oral Daily  . potassium chloride SA  20 mEq Oral Daily    PRN Meds: acetaminophen **OR** acetaminophen, albuterol, HYDROmorphone, LORazepam, zolpidem   Vital Signs    Vitals:   07/12/20 2033 07/13/20 0445 07/13/20 0500 07/13/20 0836  BP: 109/75 109/74  129/88  Pulse: 69 84  71  Resp: 15 14    Temp: (!) 97.5 F (36.4 C) 97.6 F (36.4 C)    TempSrc: Oral Oral    SpO2: 92% 98%    Weight:   51 kg   Height:        Intake/Output Summary (Last 24 hours) at 07/13/2020 1035 Last data filed at 07/13/2020 0900 Gross per 24 hour  Intake 760 ml  Output 900 ml  Net -140 ml   Last 3 Weights 07/13/2020 07/12/2020 07/11/2020  Weight (lbs) 112 lb 7 oz 110 lb 1.6 oz 110 lb  Weight (kg) 51 kg 49.941 kg 49.896 kg      Telemetry    Atrial fibrillation, HR in 70's to 80's.  - Personally Reviewed  ECG    No new tracings.   Physical Exam   GEN: Pleasant elderly female appearing in no acute distress.   Neck: No JVD Cardiac: Irregularly irregular, no murmurs, rubs, or gallops.  Respiratory: Rales along bases. No wheezing.  GI: Soft, nontender, non-distended  MS: 1+ pitting edema up to mid-shins bilaterally; No deformity. Neuro:  Nonfocal  Psych: Normal affect   Labs    High Sensitivity Troponin:   Recent Labs  Lab 07/11/20 1625  TROPONINIHS 22*      Chemistry Recent Labs  Lab 07/11/20 1242 07/12/20 0447 07/13/20 0548  NA 138 140  135  K 4.0 3.5 4.6  CL 105 103 101  CO2 23 28 27   GLUCOSE 151* 99 86  BUN 26* 23 26*  CREATININE 1.22* 1.19* 1.33*  CALCIUM 8.9 8.7* 8.4*  PROT  --   --  5.9*  ALBUMIN  --   --  3.0*  AST  --   --  25  ALT  --   --  17  ALKPHOS  --   --  153*  BILITOT  --   --  1.1  GFRNONAA 43* 45* 39*  ANIONGAP 10 9 7      Hematology Recent Labs  Lab 07/11/20 1242 07/12/20 0447 07/13/20 0548  WBC 5.8 7.1 6.1  RBC 4.65 4.25 4.16  HGB 12.4 11.3* 11.0*  HCT 41.8 37.9 37.3  MCV 89.9 89.2 89.7  MCH 26.7 26.6 26.4  MCHC 29.7* 29.8* 29.5*  RDW 24.0* 23.9* 22.9*  PLT 334 332 333    BNP Recent Labs  Lab 07/11/20 1251  BNP 2,601.0*     Radiology    DG Chest Portable 1 View  Result Date: 07/11/2020 CLINICAL DATA:  History of pulmonary emboli in congestive heart failure. Shortness of breath. EXAM: PORTABLE CHEST 1 VIEW COMPARISON:  06/06/2020 FINDINGS: Extensive artifact overlies the chest. Chronic cardiomegaly. Chronic aortic atherosclerosis and tortuosity. No evidence of pulmonary edema. New patchy density in the right mid lung could represent mild bronchopneumonia. Probable small amount of pleural fluid on the right. Multiple old augmented vertebral body fractures. IMPRESSION: Patchy density in the right mid lung could represent mild bronchopneumonia. Small right effusion suspected. Cardiomegaly and aortic atherosclerosis. No evidence of pulmonary edema. Electronically Signed   By: Paulina Fusi M.D.   On: 07/11/2020 13:47    Cardiac Studies   Echocardiogram: 05/30/2020 IMPRESSIONS   1. Left ventricular ejection fraction, by estimation, is 60 to 65%. The  left ventricle has normal function. The left ventricle has no regional  wall motion abnormalities. There is mild left ventricular hypertrophy.  Left ventricular diastolic parameters  are indeterminate.  2. Right ventricular systolic function is moderately reduced. The right  ventricular size is moderately enlarged.  3. Left  atrial size was moderately dilated.  4. Right atrial size was severely dilated.  5. The mitral valve is degenerative. Trivial mitral valve regurgitation.  No evidence of mitral stenosis. Moderate mitral annular calcification.  6. Estimated PA pressure elevated and higher than estimate on echo done  09/28/19 . Tricuspid valve regurgitation is moderate.  7. The aortic valve is calcified. There is moderate calcification of the  aortic valve. Aortic valve regurgitation is mild. Mild to moderate aortic  valve sclerosis/calcification is present, without any evidence of aortic  stenosis.  8. The inferior vena cava is dilated in size with >50% respiratory  variability, suggesting right atrial pressure of 8 mmHg.   Patient Profile     87 y.o. female w/ PMH of chronic diastolic CHF, permanent atrial fibrillation, HTN,chronic back painand recently diagnosed PE/DVT (occurring in 05/2020 and anticoagulation had been held within weeks leading to this for spinal procedures)who is currently admitted with a CHF exacerbation.  Assessment & Plan    1. Acute HFpEF -Presented with worsening respiratory distress and BNP was elevated to 2601. Currently receiving IV Lasix 60mg  BID and it is difficult to gauge her response as full I&O's have not been recorded. Listed as -1.1 L yesterday. Given her persistent volume overload, would continue IV Lasix at current dosing today. Pending repeat BMET in AM, can reduce to 40mg  BID tomorrow if creatinine trending upwards. Will ask for a standing weight for improved accuracy as she has several blankets on her bed.  - Repeat CXR is pending as the one on admission was concerning for PNA and she was treated with antibiotic therapy but procalcitonin was normal.   2. Permanent Atrial Fibrillation -HR has been well-controlled in the 70's to 80's by review of telemetry. Continue Cardizem CD and Toprol-XL at current dosing for rate-control along with Eliquis for  anticoagulation.   3. Pulmonary Embolism/DVT -Diagnosed last month after anticoagulation had been held.  She denies any evidence of active bleeding and hemoglobin remained stable at 11.0 today.  Continue Eliquis 5 mg twice daily.   4. Stage 3 CKD -Creatinine was previously elevated to 1.59 on 07/03/2020 and her diuretic was reduced. At 1.22 on admission and trending up slightly to 1.33 today.   5. Diarrhea - Reported constipation yesterday and received MiraLAX.  She has had 2 loose stools this morning.  Consider stool sample if diarrhea persists as she did receive antibiotics on admission.  6. Lactic Acidosis - Lactic Acid elevated to 5.2 yesterday, improved to 1.6 on most recent check.     For questions or  updates, please contact [facility] Please consult [email or web address] for contact info under        Signed, Ellsworth Lennox, PA-C  07/13/2020, 10:35 AM     Attending note:  Patient seen and examined.  I reviewed hospital course and discussed the case with Ms. Patrick Jupiter, I agree with her above findings.  Ms. Ledlow is currently on Lasix 60 mg IV twice daily following increase yesterday.  Urine output has been incomplete but close to 1100 cc out yesterday.  Plan to check a standing weight today.  On examination she is in no distress.  Does report having intermittent loose stools after eating, but did have a good breakfast this morning.  Lungs exhibit no wheezing, basilar crackles.  Cardiac exam with irregularly irregular rhythm.  She does have mild lower leg edema.  Pertinent lab work includes potassium 4.6, BUN 26, creatinine 1.33, AST 25, ALT 17, hemoglobin 11.0, platelets 333.  Continue current dose of Lasix, heart rate control looks to be reasonable on combination of Cardizem CD and Toprol-XL, she also remains on Eliquis for stroke prophylaxis.  Renal function has bumped up to 1.33, but will likely have to tolerate a degree of renal insufficiency to achieve reasonable  diuresis.  Continue to work with PT.  She does plan to go to a nursing facility next week.  Jonelle Sidle, M.D., F.A.C.C.

## 2020-07-14 DIAGNOSIS — R0603 Acute respiratory distress: Secondary | ICD-10-CM

## 2020-07-14 DIAGNOSIS — I420 Dilated cardiomyopathy: Secondary | ICD-10-CM | POA: Diagnosis not present

## 2020-07-14 DIAGNOSIS — I5033 Acute on chronic diastolic (congestive) heart failure: Secondary | ICD-10-CM | POA: Diagnosis not present

## 2020-07-14 DIAGNOSIS — I482 Chronic atrial fibrillation, unspecified: Secondary | ICD-10-CM | POA: Diagnosis not present

## 2020-07-14 DIAGNOSIS — N1832 Chronic kidney disease, stage 3b: Secondary | ICD-10-CM | POA: Diagnosis not present

## 2020-07-14 LAB — BASIC METABOLIC PANEL
Anion gap: 9 (ref 5–15)
BUN: 27 mg/dL — ABNORMAL HIGH (ref 8–23)
CO2: 28 mmol/L (ref 22–32)
Calcium: 8.8 mg/dL — ABNORMAL LOW (ref 8.9–10.3)
Chloride: 100 mmol/L (ref 98–111)
Creatinine, Ser: 1.25 mg/dL — ABNORMAL HIGH (ref 0.44–1.00)
GFR, Estimated: 42 mL/min — ABNORMAL LOW (ref >60)
Glucose, Bld: 96 mg/dL (ref 70–99)
Potassium: 3.7 mmol/L (ref 3.5–5.1)
Sodium: 137 mmol/L (ref 135–145)

## 2020-07-14 LAB — RESP PANEL BY RT-PCR (FLU A&B, COVID) ARPGX2
Influenza A by PCR: NEGATIVE
Influenza B by PCR: NEGATIVE
SARS Coronavirus 2 by RT PCR: NEGATIVE

## 2020-07-14 NOTE — Progress Notes (Signed)
PROGRESS NOTE    Katherine Lane  ZOX:[id number] DOB: 05-Jan-1933 DOA: 07/11/2020 PCP: Babs SciaraLuking, Scott A, MD    Brief Narrative:  Katherine Lane  is a 87 y.o. WF PMHx Chronic Diastolic CHF, permanent atrial fibrillation, HTN,chronic back painand recently diagnosed PE/DVT , p  Presents ED secondaryto complaints of shortness of breath, patient was seen at cardiologist office yesterday, noted to have increased creatinine from 1.1-1.59, so her Lasix was decreased from 60 mg twice daily to 40 mg twice daily, he was sent by cardiology today for IV diuresis due to volume overload, did not receive the COVID booster vaccines on Saturday morning, reports she has been feeling achy, with some cough and feverish until Sunday evening, but report over the last 3 days, she has been having progressive dyspnea, unable to lay flat at baseline due to her chronic back pain, does report dyspnea, denies any recent fever, chills or cough, she denies any chest pain, nausea or vomiting, she had diarrhea a few days ago but nothing recent. - in ED work-up was significant for creatinine at 1.3, significantly elevated BNP at 2100, fever, no leukocytosis, chest x-ray significant for, may represent pneumonia, small right effusion as well, cardiomegaly, [facility] hospitalist consulted to admit.  Subjective: Pt reports still doesn' t feel well as her pain remains uncontrolled, she is urinating and her breathing is better.   Her base weight is 110 pounds (49.8 kg).   Filed Weights   07/13/20 1417 07/13/20 1442 07/14/20 0437  Weight: 50.5 kg 50.6 kg 49.5 kg   Assessment & Plan: Covid vaccination; vaccinated 3/3   Active Problems:   Acute on chronic diastolic CHF (congestive heart failure) (HCC)   Chronic atrial fibrillation (HCC)   Acute on chronic diastolic HF (heart failure) (HCC)   Acute respiratory failure with hypoxia (HCC)   Pulmonary embolism (HCC)   Dyspnea   CKD (chronic kidney disease), stage III (HCC)   Dilated  cardiomyopathy (HCC)  Acute respiratory failure with hypoxia - Most likely secondary to acute on chronic diastolic CHF.  PCXR not convinced this is pneumonia.   -Negative - Obtain lactic acid.  Unless patient spikes a fever, has leukocytosis, positive lactic acid we will hold on antibiotics.  - Incentive spirometry - Flutter valve - DC antibiotics, pneumonia ruled out  Acute on chronic diastolic CHF//Dilated cardiomyopathy -Previous echocardiogram 05/30/2020 patient had retained EF with 3 other chambers moderate to severe dilation. -Strict ins and outs (inaccurate recording of I/Os) - Daily weight - 5/26 Cardizem 120 mg daily (hold) until we determine if patient has worsening CHF.   - 5/26 Lasix IV 60 mg BID - continue - 5/26 echocardiogram - Losartan 25 mg daily - Toprol 100 mg daily  Permanent Atrial fibrillation -See CHF  -Continue Eliquis.  PE/DVT -Recent diagnosis last month, continue with Eliquis for anticoagulation  Chronic pain - Pt remains difficult to control - pt and family interested in residential hospice which would be appropriate for pain management  - life expectancy 6 weeks or less given current circumstances, decompensated heart failure, advancing CKD and uncontrolled pain   CKD stage IIIb (baseline cr 1.25-1.45) -Avoid nephrotoxic medication - stable with diuresis, following  Patient is frail, elderly, at high risk for delirium during hospital stay, it would be very helpful if family can present with her during hospital stay, so I have discussed with staff to allow daughter to remain overnight with the patient if she wishes to do so.    Body mass index is  21.31 kg/m.  DVT prophylaxis: Eliquis Code Status: DNR  Family Communication: daughter POA at bedside 5/28 Status is: Inpatient  Dispo: The patient is from: Home              Anticipated d/c is to: 5/30              Anticipated d/c date is: 5/30              Patient currently unstable,  residential hospice appropriate with life expectancy 6 weeks or less   Consultants:    Procedures/Significant Events:  5/25 PCXR:-Patchy density in the right mid lung could represent mild bronchopneumonia.  -Small right effusion suspected.  I have personally reviewed and interpreted all radiology studies and my findings are as above.  Cultures   Antimicrobials: Anti-infectives (From admission, onward)   Start     Dose/Rate Route Frequency Ordered Stop   07/13/20 1600  azithromycin (ZITHROMAX) 500 mg in sodium chloride 0.9 % 250 mL IVPB  Status:  Discontinued        500 mg 250 mL/hr over 60 Minutes Intravenous Every 24 hours 07/12/20 2150 07/13/20 0810   07/13/20 1000  cefTRIAXone (ROCEPHIN) 1 g in sodium chloride 0.9 % 100 mL IVPB  Status:  Discontinued        1 g 200 mL/hr over 30 Minutes Intravenous Every 24 hours 07/12/20 2150 07/13/20 0810   07/12/20 1100  cefTRIAXone (ROCEPHIN) 1 g in sodium chloride 0.9 % 100 mL IVPB  Status:  Discontinued        1 g 200 mL/hr over 30 Minutes Intravenous Every 24 hours 07/11/20 1514 07/12/20 1733   07/11/20 1515  azithromycin (ZITHROMAX) 500 mg in sodium chloride 0.9 % 250 mL IVPB  Status:  Discontinued        500 mg 250 mL/hr over 60 Minutes Intravenous Every 24 hours 07/11/20 1514 07/12/20 1733   07/11/20 1445  cefTRIAXone (ROCEPHIN) 1 g in sodium chloride 0.9 % 100 mL IVPB        1 g 200 mL/hr over 30 Minutes Intravenous  Once 07/11/20 1444 07/11/20 1558   07/11/20 1445  doxycycline (VIBRAMYCIN) 100 mg in sodium chloride 0.9 % 250 mL IVPB        100 mg 125 mL/hr over 120 Minutes Intravenous  Once 07/11/20 1444 07/11/20 2055       Devices    LINES / TUBES:    Continuous Infusions:   Objective: Vitals:   07/13/20 2046 07/13/20 2051 07/14/20 0437 07/14/20 0554  BP: 117/82   129/87  Pulse: 88   99  Resp: 18   18  Temp: 98.2 F (36.8 C)   97.7 F (36.5 C)  TempSrc: Oral   Oral  SpO2: 100% 94%  98%  Weight:   49.5 kg    Height:        Intake/Output Summary (Last 24 hours) at 07/14/2020 0916 Last data filed at 07/14/2020 0843 Gross per 24 hour  Intake 340 ml  Output --  Net 340 ml   Filed Weights   07/13/20 1417 07/13/20 1442 07/14/20 0437  Weight: 50.5 kg 50.6 kg 49.5 kg    Examination:  Physical Exam Vitals and nursing note reviewed.  Constitutional:      Appearance: Normal appearance. She is normal weight.  HENT:     Head: Normocephalic and atraumatic.     Nose: Nose normal. No congestion or rhinorrhea.     Mouth/Throat:     Mouth: Mucous membranes  are moist.     Pharynx: No oropharyngeal exudate.  Eyes:     Extraocular Movements: Extraocular movements intact.  Cardiovascular:     Rate and Rhythm: Normal rate.     Pulses: Normal pulses.  Pulmonary:     Effort: Pulmonary effort is normal. No respiratory distress.     Breath sounds: No stridor. No rhonchi.  Abdominal:     General: Bowel sounds are normal. There is no distension.     Palpations: Abdomen is soft. There is no mass.  Musculoskeletal:        General: Normal range of motion.     Cervical back: Normal range of motion and neck supple.  Skin:    General: Skin is warm.  Neurological:     General: No focal deficit present.     Mental Status: She is alert.  Psychiatric:        Mood and Affect: Mood normal.     Data Reviewed: Care during the described time interval was provided by me .  I have reviewed this patient's available data, including medical history, events of note, physical examination, and all test results as part of my evaluation.  CBC: Recent Labs  Lab 07/10/20 1119 07/11/20 1242 07/12/20 0447 07/13/20 0548  WBC 6.9 5.8 7.1 6.1  NEUTROABS 4.2  --   --  3.6  HGB 11.8* 12.4 11.3* 11.0*  HCT 40.2 41.8 37.9 37.3  MCV 89.5 89.9 89.2 89.7  PLT 283 334 332 333   Basic Metabolic Panel: Recent Labs  Lab 07/11/20 1242 07/12/20 0447 07/13/20 0548 07/14/20 0605  NA 138 140 135 137  K 4.0 3.5 4.6 3.7   CL 105 103 101 100  CO2 23 28 27 28   GLUCOSE 151* 99 86 96  BUN 26* 23 26* 27*  CREATININE 1.22* 1.19* 1.33* 1.25*  CALCIUM 8.9 8.7* 8.4* 8.8*  MG  --   --  2.2  --   PHOS  --   --  4.0  --    GFR: Estimated Creatinine Clearance: 23.6 mL/min (A) (by C-G formula based on SCr of 1.25 mg/dL (H)). Liver Function Tests: Recent Labs  Lab 07/13/20 0548  AST 25  ALT 17  ALKPHOS 153*  BILITOT 1.1  PROT 5.9*  ALBUMIN 3.0*   No results for input(s): LIPASE, AMYLASE in the last 168 hours. No results for input(s): AMMONIA in the last 168 hours. Coagulation Profile: No results for input(s): INR, PROTIME in the last 168 hours. Cardiac Enzymes: No results for input(s): CKTOTAL, CKMB, CKMBINDEX, TROPONINI in the last 168 hours. BNP (last 3 results) No results for input(s): PROBNP in the last 8760 hours. HbA1C: No results for input(s): HGBA1C in the last 72 hours. CBG: No results for input(s): GLUCAP in the last 168 hours. Lipid Profile: No results for input(s): CHOL, HDL, LDLCALC, TRIG, CHOLHDL, LDLDIRECT in the last 72 hours. Thyroid Function Tests: No results for input(s): TSH, T4TOTAL, FREET4, T3FREE, THYROIDAB in the last 72 hours. Anemia Panel: No results for input(s): VITAMINB12, FOLATE, FERRITIN, TIBC, IRON, RETICCTPCT in the last 72 hours. Sepsis Labs: Recent Labs  Lab 07/12/20 0447 07/12/20 1804 07/12/20 2038 07/12/20 2254 07/13/20 0152 07/13/20 0548  PROCALCITON <0.10  --   --   --   --  <0.10  LATICACIDVEN  --  5.2* 4.4* 2.4* 1.6  --     Recent Results (from the past 240 hour(s))  SARS CORONAVIRUS 2 (TAT 6-24 HRS) Nasopharyngeal Nasopharyngeal Swab  Status: None   Collection Time: 07/11/20  2:49 PM   Specimen: Nasopharyngeal Swab  Result Value Ref Range Status   SARS Coronavirus 2 NEGATIVE NEGATIVE Final    Comment: (NOTE) SARS-CoV-2 target nucleic acids are NOT DETECTED.  The SARS-CoV-2 RNA is generally detectable in upper and lower respiratory specimens  during the acute phase of infection. Negative results do not preclude SARS-CoV-2 infection, do not rule out co-infections with other pathogens, and should not be used as the sole basis for treatment or other patient management decisions. Negative results must be combined with clinical observations, patient history, and epidemiological information. The expected result is Negative.  Fact Sheet for Patients: HairSlick.no  Fact Sheet for Healthcare Providers: quierodirigir.com  This test is not yet approved or cleared by the [place] FDA and  has been authorized for detection and/or diagnosis of SARS-CoV-2 by FDA under an Emergency Use Authorization (EUA). This EUA will remain  in effect (meaning this test can be used) for the duration of the COVID-19 declaration under Se ction 564(b)(1) of the Act, 21 U.S.C. section 360bbb-3(b)(1), unless the authorization is terminated or revoked sooner.  Performed at [facility], [address] [place], [place] [address]      Radiology Studies: DG CHEST PORT 1 VIEW  Result Date: 07/13/2020 CLINICAL DATA:  Severe upper back pain. Intermittent shortness of breath. EXAM: PORTABLE CHEST 1 VIEW COMPARISON:  07/11/2020 FINDINGS: Stable calcifications or high-density material in the right lower chest. Patient has bone cement in multiple vertebral bodies. Heart and mediastinum are stable. Coarse lung markings are chronic. No focal lung disease. Limited evaluation of the thoracic vertebral bodies on this single view. IMPRESSION: Chronic lung changes without acute findings. Electronically Signed   By: Richarda Overlie M.D.   On: 07/13/2020 12:49    Scheduled Meds: . apixaban  5 mg Oral BID  . furosemide  60 mg Intravenous BID  . losartan  25 mg Oral Daily  . metoprolol succinate  100 mg Oral Daily  . potassium chloride  10 mEq Oral Daily  . potassium chloride SA  20 mEq Oral Daily   Continuous  Infusions:    LOS: 2 days    Time spent:40 min   Standley Dakins, MD [facility]   If 7PM-7AM, please contact night-coverage 07/14/2020, 9:16 AM

## 2020-07-14 NOTE — Plan of Care (Signed)
  Problem: Education: Goal: Knowledge of General Education information will improve Description: Including pain rating scale, medication(s)/side effects and non-pharmacologic comfort measures Outcome: Progressing   Problem: Clinical Measurements: Goal: Ability to maintain clinical measurements within normal limits will improve Outcome: Progressing   

## 2020-07-14 NOTE — TOC Progression Note (Signed)
Transition of Care [facility]) - Progression Note    Patient Details  Name: SEMIAH KONCZAL MRN: [id number] Date of Birth: 1932-09-29  Transition of Care [facility]) CM/SW Contact  Barry Brunner, LCSW Phone Number: 07/14/2020, 7:02 PM  Clinical Narrative:    CSW faxed referral to [facility] house. [facility] reported that they had received it, but Hospice MD had not yet approved patient. TOC to follow.   Expected Discharge Plan: Home/Self Care Barriers to Discharge: Continued Medical Work up  Expected Discharge Plan and Services Expected Discharge Plan: Home/Self Care       Living arrangements for the past 2 months: Single Family Home                                       Social Determinants of Health (SDOH) Interventions    Readmission Risk Interventions No flowsheet data found.

## 2020-07-15 DIAGNOSIS — I482 Chronic atrial fibrillation, unspecified: Secondary | ICD-10-CM | POA: Diagnosis not present

## 2020-07-15 DIAGNOSIS — I2609 Other pulmonary embolism with acute cor pulmonale: Secondary | ICD-10-CM

## 2020-07-15 DIAGNOSIS — I5033 Acute on chronic diastolic (congestive) heart failure: Secondary | ICD-10-CM | POA: Diagnosis not present

## 2020-07-15 DIAGNOSIS — I420 Dilated cardiomyopathy: Secondary | ICD-10-CM | POA: Diagnosis not present

## 2020-07-15 DIAGNOSIS — N1832 Chronic kidney disease, stage 3b: Secondary | ICD-10-CM | POA: Diagnosis not present

## 2020-07-15 LAB — BASIC METABOLIC PANEL
Anion gap: 10 (ref 5–15)
BUN: 24 mg/dL — ABNORMAL HIGH (ref 8–23)
CO2: 26 mmol/L (ref 22–32)
Calcium: 8.6 mg/dL — ABNORMAL LOW (ref 8.9–10.3)
Chloride: 99 mmol/L (ref 98–111)
Creatinine, Ser: 1 mg/dL (ref 0.44–1.00)
GFR, Estimated: 55 mL/min — ABNORMAL LOW (ref >60)
Glucose, Bld: 96 mg/dL (ref 70–99)
Potassium: 3.8 mmol/L (ref 3.5–5.1)
Sodium: 135 mmol/L (ref 135–145)

## 2020-07-15 MED ORDER — HYDROCORTISONE (PERIANAL) 2.5 % EX CREA
1.0000 | TOPICAL_CREAM | Freq: Two times a day (BID) | CUTANEOUS | Status: DC | PRN
Start: 2020-07-15 — End: 2020-08-17

## 2020-07-15 MED ORDER — POTASSIUM CHLORIDE ER 20 MEQ PO TBCR: 20.0000 meq | EXTENDED_RELEASE_TABLET | Freq: Every day | ORAL | Status: DC

## 2020-07-15 MED ORDER — FLUTICASONE PROPIONATE 50 MCG/ACT NA SUSP: 2.0000 | Freq: Every day | NASAL | Status: DC | PRN

## 2020-07-15 MED ORDER — LOSARTAN POTASSIUM 50 MG PO TABS: 25.0000 mg | ORAL_TABLET | Freq: Every day | ORAL | Status: DC

## 2020-07-15 MED ORDER — FUROSEMIDE 40 MG PO TABS
40.0000 mg | ORAL_TABLET | Freq: Two times a day (BID) | ORAL | Status: DC
  Administered 2020-07-15 – 2020-07-16 (×2): 40 mg via ORAL
  Filled 2020-07-15 (×2): qty 1

## 2020-07-15 NOTE — Plan of Care (Signed)
  Problem: Education: Goal: Knowledge of General Education information will improve Description: Including pain rating scale, medication(s)/side effects and non-pharmacologic comfort measures Outcome: Progressing   Problem: Clinical Measurements: Goal: Will remain free from infection Outcome: Progressing   

## 2020-07-15 NOTE — TOC Transition Note (Addendum)
Transition of Care [facility]) - CM/SW Discharge Note   Patient Details  Name: Katherine Lane MRN: [id number] Date of Birth: 1932/07/30  Transition of Care [facility]) CM/SW Contact:  Barry Brunner, LCSW Phone Number: 07/15/2020, 10:30 AM   Clinical Narrative:    CSW notified of patient's readiness for discharge. CSW faxed discharge summary, completed med necessity and called EMS. Nurse to call report. TOC signing off.   Addendum  CSW notified that patient's daughter requested that patient not be admitted to [facility] until tomorrow. 07/16/2020. [facility] agreed to discharge delay. TOC to follow.   Final next level of care: Hospice Medical Facility Barriers to Discharge: Barriers Resolved   Patient Goals and CMS Choice Patient states their goals for this hospitalization and ongoing recovery are:: Residential Hospice CMS Medicare.gov Compare Post Acute Care list provided to:: Patient Choice offered to / list presented to : Patient  Discharge Placement                Patient to be transferred to facility by: [facility] Name of family member notified: Dionicia Abler (Daughter)   [phone] Patient and family notified of of transfer: 07/15/20  Discharge Plan and Services                                     Social Determinants of Health (SDOH) Interventions     Readmission Risk Interventions No flowsheet data found.

## 2020-07-15 NOTE — Discharge Summary (Addendum)
Physician Discharge Summary  Katherine Lane ZOX:[id number] DOB: 1932/12/01 DOA: 07/11/2020  PCP: Kathyrn Drown, MD  Admit date: 07/11/2020 Discharge date: 07/15/2020  Admitted From:  Home  Disposition: Residential Hospice   Recommendations for Outpatient 1. SYMPTOM MANAGEMENT PER HOSPICE PROTOCOLS  Discharge Condition: HOSPICE   CODE STATUS: DNR DIET: HEART HEALTHY   Brief Hospitalization Summary: Please see all hospital notes, images, labs for full details of the hospitalization. ADMISSION HPI:  Katherine Lane  is a 87 y.o. female, with past medical history ofchronic diastolic CHF, permanent atrial fibrillation, HTN,chronic back painand recently diagnosed PE/DVT , patient presents ED secondary to complaints of shortness of breath, patient was seen at cardiologist office yesterday, noted to have increased creatinine from 1.1-1.59, so her Lasix was decreased from 60 mg twice daily to 40 mg twice daily, he was sent by cardiology today for IV diuresis due to volume overload, did not receive the COVID booster vaccines on Saturday morning, reports she has been feeling achy, with some cough and feverish until Sunday evening, but report over the last 3 days, she has been having progressive dyspnea, unable to lay flat at baseline due to her chronic back pain, does report dyspnea, denies any recent fever, chills or cough, she denies any chest pain, nausea or vomiting, she had diarrhea a few days ago but nothing recent.  In ED work-up was significant for creatinine at 1.3, significantly elevated BNP at 2100, fever, no leukocytosis, chest x-ray significant for, may represent pneumonia, small right effusion as well, cardiomegaly, [facility] consulted to admit.  HOSPITAL COURSE  Acute respiratory failure with hypoxia - DC antibiotics, pneumonia ruled out  Acute on chronic diastolic CHF//Dilated cardiomyopathy -Previous echocardiogram 05/30/2020 patient had retained EF with 3 other chambers  moderate to severe dilation. -Strict ins and outs (inaccurate recording of I/Os) - 5/26 Lasix IV 60 mg BID - difficult diuresis, resuming home lasix 40 mg BID with potassium supplement at discharge.  - Losartan 25 mg daily - Toprol 100 mg daily  Permanent Atrial fibrillation -See CHF  -Continue Eliquis for stroke prevention.  PE/DVT -Recent diagnosis last month, continue with Eliquis for anticoagulation  Chronic pain - Pt remains difficult to control - pt and family requesting residential hospice which would be appropriate for pain management  - life expectancy 6 weeks or less given current circumstances, decompensated heart failure, advancing CKD and uncontrolled pain.  Symptom management per hospice protocols.    CKD stage IIIb (baseline cr 1.25-1.45) -Avoid nephrotoxic medication - stable with diuresis  Patient is frail, elderly, at high risk for delirium during hospital stay,itwould be very helpfuliffamily can present with her as much as possible    Body mass index is 21.31 kg/m.  DVT prophylaxis: Eliquis Code Status: DNR  Family Communication: daughter POA at bedside 5/28 Status is: Inpatient  Discharge Diagnoses:  Active Problems:   Acute on chronic diastolic CHF (congestive heart failure) (HCC)   Chronic atrial fibrillation (HCC)   Acute on chronic diastolic HF (heart failure) (HCC)   Acute respiratory failure with hypoxia (HCC)   Pulmonary embolism (HCC)   Dyspnea   CKD (chronic kidney disease), stage III ([place])   Dilated cardiomyopathy ([place])  Discharge Instructions:  Allergies as of 07/15/2020      Reactions   Amlodipine Swelling   Leg swelling   Codeine Nausea And Vomiting, Other (See Comments)   Patient states "intolerance to all pain medications"  (NO OPIOIDS) VERY VIOLENT VOMITING!!   Other Nausea And Vomiting, Other (  See Comments)   general anesthesia   Tramadol Nausea And Vomiting, Other (See Comments)   "NO OPIOIDS!!! VERY VIOLENT  VOMITING!!" per Med History prior to 02/18/17      Medication List    STOP taking these medications   acetaminophen 325 MG tablet Commonly known as: TYLENOL   diltiazem 120 MG 24 hr capsule Commonly known as: CARDIZEM CD   diltiazem 30 MG tablet Commonly known as: CARDIZEM   HYDROmorphone 2 MG tablet Commonly known as: DILAUDID   Lidocaine-Hydrocortisone Ace 3-2.5 % Kit   LORazepam 0.5 MG tablet Commonly known as: ATIVAN   methocarbamol 500 MG tablet Commonly known as: ROBAXIN   metolazone 2.5 MG tablet Commonly known as: ZAROXOLYN   multivitamin with minerals Tabs tablet   potassium chloride 10 MEQ tablet Commonly known as: KLOR-CON   saccharomyces boulardii 250 MG capsule Commonly known as: FLORASTOR   shark liver oil-cocoa butter 0.25-3-85.5 % suppository Commonly known as: PREPARATION H   zolpidem 5 MG tablet Commonly known as: AMBIEN     TAKE these medications   Eliquis 5 MG Tabs tablet Generic drug: apixaban Take 5 mg by mouth 2 (two) times daily. What changed: Another medication with the same name was removed. Continue taking this medication, and follow the directions you see here.   fluticasone 50 MCG/ACT nasal spray Commonly known as: FLONASE Place 2 sprays into both nostrils daily as needed (ears stopped it).   furosemide 40 MG tablet Commonly known as: LASIX Take 1 tablet (40 mg total) by mouth 2 (two) times daily.   hydrocortisone 2.5 % rectal cream Commonly known as: ANUSOL-HC Place 1 application rectally 2 (two) times daily as needed for hemorrhoids or anal itching.   losartan 50 MG tablet Commonly known as: COZAAR Take 0.5 tablets (25 mg total) by mouth daily. Start taking on: Jul 16, 2020 What changed: additional instructions   Lubricant Eye Drops 0.4-0.3 % Soln Generic drug: Polyethyl Glycol-Propyl Glycol Place 1 drop into both eyes at bedtime.   metoprolol succinate 100 MG 24 hr tablet Commonly known as: TOPROL-XL TAKE ONE  TABLET BY MOUTH ONCE DAILY.   polyethylene glycol powder 17 GM/SCOOP powder Commonly known as: GLYCOLAX/MIRALAX Take 17 g by mouth daily as needed for severe constipation.   Potassium Chloride ER 20 MEQ Tbcr Take 20 mEq by mouth daily. What changed:   medication strength  when to take this  additional instructions       Allergies  Allergen Reactions  . Amlodipine Swelling    Leg swelling   . Codeine Nausea And Vomiting and Other (See Comments)    Patient states "intolerance to all pain medications"  (NO OPIOIDS) VERY VIOLENT VOMITING!!  . Other Nausea And Vomiting and Other (See Comments)    general anesthesia  . Tramadol Nausea And Vomiting and Other (See Comments)    "NO OPIOIDS!!! VERY VIOLENT VOMITING!!" per Med History prior to 02/18/17   Allergies as of 07/15/2020      Reactions   Amlodipine Swelling   Leg swelling   Codeine Nausea And Vomiting, Other (See Comments)   Patient states "intolerance to all pain medications"  (NO OPIOIDS) VERY VIOLENT VOMITING!!   Other Nausea And Vomiting, Other (See Comments)   general anesthesia   Tramadol Nausea And Vomiting, Other (See Comments)   "NO OPIOIDS!!! VERY VIOLENT VOMITING!!" per Med History prior to 02/18/17      Medication List    STOP taking these medications   acetaminophen 325 MG tablet  Commonly known as: TYLENOL   diltiazem 120 MG 24 hr capsule Commonly known as: CARDIZEM CD   diltiazem 30 MG tablet Commonly known as: CARDIZEM   HYDROmorphone 2 MG tablet Commonly known as: DILAUDID   Lidocaine-Hydrocortisone Ace 3-2.5 % Kit   LORazepam 0.5 MG tablet Commonly known as: ATIVAN   methocarbamol 500 MG tablet Commonly known as: ROBAXIN   metolazone 2.5 MG tablet Commonly known as: ZAROXOLYN   multivitamin with minerals Tabs tablet   potassium chloride 10 MEQ tablet Commonly known as: KLOR-CON   saccharomyces boulardii 250 MG capsule Commonly known as: FLORASTOR   shark liver oil-cocoa butter  0.25-3-85.5 % suppository Commonly known as: PREPARATION H   zolpidem 5 MG tablet Commonly known as: AMBIEN     TAKE these medications   Eliquis 5 MG Tabs tablet Generic drug: apixaban Take 5 mg by mouth 2 (two) times daily. What changed: Another medication with the same name was removed. Continue taking this medication, and follow the directions you see here.   fluticasone 50 MCG/ACT nasal spray Commonly known as: FLONASE Place 2 sprays into both nostrils daily as needed (ears stopped it).   furosemide 40 MG tablet Commonly known as: LASIX Take 1 tablet (40 mg total) by mouth 2 (two) times daily.   hydrocortisone 2.5 % rectal cream Commonly known as: ANUSOL-HC Place 1 application rectally 2 (two) times daily as needed for hemorrhoids or anal itching.   losartan 50 MG tablet Commonly known as: COZAAR Take 0.5 tablets (25 mg total) by mouth daily. Start taking on: Jul 16, 2020 What changed: additional instructions   Lubricant Eye Drops 0.4-0.3 % Soln Generic drug: Polyethyl Glycol-Propyl Glycol Place 1 drop into both eyes at bedtime.   metoprolol succinate 100 MG 24 hr tablet Commonly known as: TOPROL-XL TAKE ONE TABLET BY MOUTH ONCE DAILY.   polyethylene glycol powder 17 GM/SCOOP powder Commonly known as: GLYCOLAX/MIRALAX Take 17 g by mouth daily as needed for severe constipation.   Potassium Chloride ER 20 MEQ Tbcr Take 20 mEq by mouth daily. What changed:   medication strength  when to take this  additional instructions       Procedures/Studies: DG CHEST PORT 1 VIEW  Result Date: 07/13/2020 CLINICAL DATA:  Severe upper back pain. Intermittent shortness of breath. EXAM: PORTABLE CHEST 1 VIEW COMPARISON:  07/11/2020 FINDINGS: Stable calcifications or high-density material in the right lower chest. Patient has bone cement in multiple vertebral bodies. Heart and mediastinum are stable. Coarse lung markings are chronic. No focal lung disease. Limited evaluation  of the thoracic vertebral bodies on this single view. IMPRESSION: Chronic lung changes without acute findings. Electronically Signed   By: Markus Daft M.D.   On: 07/13/2020 12:49   DG Chest Portable 1 View  Result Date: 07/11/2020 CLINICAL DATA:  History of pulmonary emboli in congestive heart failure. Shortness of breath. EXAM: PORTABLE CHEST 1 VIEW COMPARISON:  06/06/2020 FINDINGS: Extensive artifact overlies the chest. Chronic cardiomegaly. Chronic aortic atherosclerosis and tortuosity. No evidence of pulmonary edema. New patchy density in the right mid lung could represent mild bronchopneumonia. Probable small amount of pleural fluid on the right. Multiple old augmented vertebral body fractures. IMPRESSION: Patchy density in the right mid lung could represent mild bronchopneumonia. Small right effusion suspected. Cardiomegaly and aortic atherosclerosis. No evidence of pulmonary edema. Electronically Signed   By: Nelson Chimes M.D.   On: 07/11/2020 13:47      Subjective: Pt says pain seems better controlled today.  Mild  SOB.  Urinating with no difficulty.  No hemorrhoidal pain.   Discharge Exam: Vitals:   07/14/20 2015 07/15/20 0542  BP: (!) 111/98 139/81  Pulse: 90 84  Resp: 19 18  Temp: 98.2 F (36.8 C) 98.3 F (36.8 C)  SpO2: 95% 97%   Vitals:   07/14/20 0554 07/14/20 1610 07/14/20 2015 07/15/20 0542  BP: 129/87 113/74 (!) 111/98 139/81  Pulse: 99 88 90 84  Resp: '18 16 19 18  ' Temp: 97.7 F (36.5 C) 97.8 F (36.6 C) 98.2 F (36.8 C) 98.3 F (36.8 C)  TempSrc: Oral Oral  Oral  SpO2: 98% 100% 95% 97%  Weight:    51.7 kg  Height:       General: Pt is elderly frail, sitting in chair, alert, awake, not in acute distress Cardiovascular: normal S1/S2 +, no rubs, no gallops Respiratory: CTA bilaterally, no wheezing, no rhonchi Abdominal: Soft, NT, ND, bowel sounds + Extremities: 1+ edema BLEs, no cyanosis   The results of significant diagnostics from this hospitalization  (including imaging, microbiology, ancillary and laboratory) are listed below for reference.     Microbiology: Recent Results (from the past 240 hour(s))  SARS CORONAVIRUS 2 (TAT 6-24 HRS) Nasopharyngeal Nasopharyngeal Swab     Status: None   Collection Time: 07/11/20  2:49 PM   Specimen: Nasopharyngeal Swab  Result Value Ref Range Status   SARS Coronavirus 2 NEGATIVE NEGATIVE Final    Comment: (NOTE) SARS-CoV-2 target nucleic acids are NOT DETECTED.  The SARS-CoV-2 RNA is generally detectable in upper and lower respiratory specimens during the acute phase of infection. Negative results do not preclude SARS-CoV-2 infection, do not rule out co-infections with other pathogens, and should not be used as the sole basis for treatment or other patient management decisions. Negative results must be combined with clinical observations, patient history, and epidemiological information. The expected result is Negative.  Fact Sheet for Patients: SugarRoll.be  Fact Sheet for Healthcare Providers: https://www.woods-mathews.com/  This test is not yet approved or cleared by the [place] FDA and  has been authorized for detection and/or diagnosis of SARS-CoV-2 by FDA under an Emergency Use Authorization (EUA). This EUA will remain  in effect (meaning this test can be used) for the duration of the COVID-19 declaration under Se ction 564(b)(1) of the Act, 21 U.S.C. section 360bbb-3(b)(1), unless the authorization is terminated or revoked sooner.  Performed at [facility], [place] [address], [place], [place] [address]   Resp Panel by RT-PCR (Flu A&B, Covid) Nasopharyngeal Swab     Status: None   Collection Time: 07/14/20 10:00 AM   Specimen: Nasopharyngeal Swab; Nasopharyngeal(NP) swabs in vial transport medium  Result Value Ref Range Status   SARS Coronavirus 2 by RT PCR NEGATIVE NEGATIVE Final    Comment: (NOTE) SARS-CoV-2 target nucleic  acids are NOT DETECTED.  The SARS-CoV-2 RNA is generally detectable in upper respiratory specimens during the acute phase of infection. The lowest concentration of SARS-CoV-2 viral copies this assay can detect is 138 copies/mL. A negative result does not preclude SARS-Cov-2 infection and should not be used as the sole basis for treatment or other patient management decisions. A negative result may occur with  improper specimen collection/handling, submission of specimen other than nasopharyngeal swab, presence of viral mutation(s) within the areas targeted by this assay, and inadequate number of viral copies(<138 copies/mL). A negative result must be combined with clinical observations, patient history, and epidemiological information. The expected result is Negative.  Fact Sheet  for Patients:  EntrepreneurPulse.com.au  Fact Sheet for Healthcare Providers:  IncredibleEmployment.be  This test is no t yet approved or cleared by the [place] FDA and  has been authorized for detection and/or diagnosis of SARS-CoV-2 by FDA under an Emergency Use Authorization (EUA). This EUA will remain  in effect (meaning this test can be used) for the duration of the COVID-19 declaration under Section 564(b)(1) of the Act, 21 U.S.C.section 360bbb-3(b)(1), unless the authorization is terminated  or revoked sooner.       Influenza A by PCR NEGATIVE NEGATIVE Final   Influenza B by PCR NEGATIVE NEGATIVE Final    Comment: (NOTE) The Xpert Xpress SARS-CoV-2/FLU/RSV plus assay is intended as an aid in the diagnosis of influenza from Nasopharyngeal swab specimens and should not be used as a sole basis for treatment. Nasal washings and aspirates are unacceptable for Xpert Xpress SARS-CoV-2/FLU/RSV testing.  Fact Sheet for Patients: EntrepreneurPulse.com.au  Fact Sheet for Healthcare Providers: IncredibleEmployment.be  This  test is not yet approved or cleared by the [place] FDA and has been authorized for detection and/or diagnosis of SARS-CoV-2 by FDA under an Emergency Use Authorization (EUA). This EUA will remain in effect (meaning this test can be used) for the duration of the COVID-19 declaration under Section 564(b)(1) of the Act, 21 U.S.C. section 360bbb-3(b)(1), unless the authorization is terminated or revoked.  Performed at [facility], [address] [place], [place] [address]      Labs: BNP (last 3 results) Recent Labs    02/23/20 1155 05/30/20 0946 07/11/20 1251  BNP 996.0* 2,187.0* 3,374.4*   Basic Metabolic Panel: Recent Labs  Lab 07/11/20 1242 07/12/20 0447 07/13/20 0548 07/14/20 0605 07/15/20 0641  NA 138 140 135 137 135  K 4.0 3.5 4.6 3.7 3.8  CL 105 103 101 100 99  CO2 '23 28 27 28 26  ' GLUCOSE 151* 99 86 96 96  BUN 26* 23 26* 27* 24*  CREATININE 1.22* 1.19* 1.33* 1.25* 1.00  CALCIUM 8.9 8.7* 8.4* 8.8* 8.6*  MG  --   --  2.2  --   --   PHOS  --   --  4.0  --   --    Liver Function Tests: Recent Labs  Lab 07/13/20 0548  AST 25  ALT 17  ALKPHOS 153*  BILITOT 1.1  PROT 5.9*  ALBUMIN 3.0*   No results for input(s): LIPASE, AMYLASE in the last 168 hours. No results for input(s): AMMONIA in the last 168 hours. CBC: Recent Labs  Lab 07/10/20 1119 07/11/20 1242 07/12/20 0447 07/13/20 0548  WBC 6.9 5.8 7.1 6.1  NEUTROABS 4.2  --   --  3.6  HGB 11.8* 12.4 11.3* 11.0*  HCT 40.2 41.8 37.9 37.3  MCV 89.5 89.9 89.2 89.7  PLT 283 334 332 333   Cardiac Enzymes: No results for input(s): CKTOTAL, CKMB, CKMBINDEX, TROPONINI in the last 168 hours. BNP: Invalid input(s): POCBNP CBG: No results for input(s): GLUCAP in the last 168 hours. D-Dimer No results for input(s): DDIMER in the last 72 hours. Hgb A1c No results for input(s): HGBA1C in the last 72 hours. Lipid Profile No results for input(s): CHOL, HDL, LDLCALC, TRIG, CHOLHDL, LDLDIRECT in the last 72  hours. Thyroid function studies No results for input(s): TSH, T4TOTAL, T3FREE, THYROIDAB in the last 72 hours.  Invalid input(s): FREET3 Anemia work up No results for input(s): VITAMINB12, FOLATE, FERRITIN, TIBC, IRON, RETICCTPCT in the last 72 hours. Urinalysis    Component Value  Date/Time   COLORURINE STRAW (A) 05/30/2020 0941   APPEARANCEUR CLEAR 05/30/2020 0941   APPEARANCEUR Cloudy (A) 06/20/2016 1400   LABSPEC 1.006 05/30/2020 0941   PHURINE 5.0 05/30/2020 0941   GLUCOSEU NEGATIVE 05/30/2020 0941   HGBUR NEGATIVE 05/30/2020 0941   BILIRUBINUR NEGATIVE 05/30/2020 0941   BILIRUBINUR Negative 06/20/2016 1400   KETONESUR NEGATIVE 05/30/2020 0941   PROTEINUR NEGATIVE 05/30/2020 0941   UROBILINOGEN 0.2 03/17/2016 1043   UROBILINOGEN 0.2 02/15/2009 1217   NITRITE NEGATIVE 05/30/2020 0941   LEUKOCYTESUR NEGATIVE 05/30/2020 0941   Sepsis Labs Invalid input(s): PROCALCITONIN,  WBC,  LACTICIDVEN Microbiology Recent Results (from the past 240 hour(s))  SARS CORONAVIRUS 2 (TAT 6-24 HRS) Nasopharyngeal Nasopharyngeal Swab     Status: None   Collection Time: 07/11/20  2:49 PM   Specimen: Nasopharyngeal Swab  Result Value Ref Range Status   SARS Coronavirus 2 NEGATIVE NEGATIVE Final    Comment: (NOTE) SARS-CoV-2 target nucleic acids are NOT DETECTED.  The SARS-CoV-2 RNA is generally detectable in upper and lower respiratory specimens during the acute phase of infection. Negative results do not preclude SARS-CoV-2 infection, do not rule out co-infections with other pathogens, and should not be used as the sole basis for treatment or other patient management decisions. Negative results must be combined with clinical observations, patient history, and epidemiological information. The expected result is Negative.  Fact Sheet for Patients: SugarRoll.be  Fact Sheet for Healthcare Providers: https://www.woods-mathews.com/  This test is not  yet approved or cleared by the [place] FDA and  has been authorized for detection and/or diagnosis of SARS-CoV-2 by FDA under an Emergency Use Authorization (EUA). This EUA will remain  in effect (meaning this test can be used) for the duration of the COVID-19 declaration under Se ction 564(b)(1) of the Act, 21 U.S.C. section 360bbb-3(b)(1), unless the authorization is terminated or revoked sooner.  Performed at [facility], [place] [address] [place], [place] [address]   Resp Panel by RT-PCR (Flu A&B, Covid) Nasopharyngeal Swab     Status: None   Collection Time: 07/14/20 10:00 AM   Specimen: Nasopharyngeal Swab; Nasopharyngeal(NP) swabs in vial transport medium  Result Value Ref Range Status   SARS Coronavirus 2 by RT PCR NEGATIVE NEGATIVE Final    Comment: (NOTE) SARS-CoV-2 target nucleic acids are NOT DETECTED.  The SARS-CoV-2 RNA is generally detectable in upper respiratory specimens during the acute phase of infection. The lowest concentration of SARS-CoV-2 viral copies this assay can detect is 138 copies/mL. A negative result does not preclude SARS-Cov-2 infection and should not be used as the sole basis for treatment or other patient management decisions. A negative result may occur with  improper specimen collection/handling, submission of specimen other than nasopharyngeal swab, presence of viral mutation(s) within the areas targeted by this assay, and inadequate number of viral copies(<138 copies/mL). A negative result must be combined with clinical observations, patient history, and epidemiological information. The expected result is Negative.  Fact Sheet for Patients:  EntrepreneurPulse.com.au  Fact Sheet for Healthcare Providers:  IncredibleEmployment.be  This test is no t yet approved or cleared by the [place] FDA and  has been authorized for detection and/or diagnosis of SARS-CoV-2 by FDA under an Emergency Use  Authorization (EUA). This EUA will remain  in effect (meaning this test can be used) for the duration of the COVID-19 declaration under Section 564(b)(1) of the Act, 21 U.S.C.section 360bbb-3(b)(1), unless the authorization is terminated  or revoked sooner.  Influenza A by PCR NEGATIVE NEGATIVE Final   Influenza B by PCR NEGATIVE NEGATIVE Final    Comment: (NOTE) The Xpert Xpress SARS-CoV-2/FLU/RSV plus assay is intended as an aid in the diagnosis of influenza from Nasopharyngeal swab specimens and should not be used as a sole basis for treatment. Nasal washings and aspirates are unacceptable for Xpert Xpress SARS-CoV-2/FLU/RSV testing.  Fact Sheet for Patients: EntrepreneurPulse.com.au  Fact Sheet for Healthcare Providers: IncredibleEmployment.be  This test is not yet approved or cleared by the [place] FDA and has been authorized for detection and/or diagnosis of SARS-CoV-2 by FDA under an Emergency Use Authorization (EUA). This EUA will remain in effect (meaning this test can be used) for the duration of the COVID-19 declaration under Section 564(b)(1) of the Act, 21 U.S.C. section 360bbb-3(b)(1), unless the authorization is terminated or revoked.  Performed at [facility], [address], [place], [place] [address]    Time coordinating discharge: 35 mins  SIGNED:  [place] Brakeman, MD  [facility] 07/15/2020, 9:49 AM How to contact the [facility] Attending or Consulting provider [place] or covering provider during after hours [place], for this patient?  1. Check the care team in [facility] and look for a) attending/consulting [facility] provider listed and b) the [facility] team listed 2. Log into [email or web address] and use [place]'s universal password to access. If you do not have the password, please contact the hospital operator. 3. Locate the [facility] provider you are looking for under [facility] and page to a number that you can be directly  reached. 4. If you still have difficulty reaching the provider, please page the [facility] (Director on Call) for the Hospitalists listed on amion for assistance.

## 2020-07-15 NOTE — Plan of Care (Signed)
  Problem: Education: Goal: Knowledge of General Education information will improve Description: Including pain rating scale, medication(s)/side effects and non-pharmacologic comfort measures 07/15/2020 1103 by Karolee Ohs, RN Outcome: Adequate for Discharge 07/15/2020 0924 by Karolee Ohs, RN Outcome: Progressing   Problem: Health Behavior/Discharge Planning: Goal: Ability to manage health-related needs will improve Outcome: Adequate for Discharge   Problem: Clinical Measurements: Goal: Ability to maintain clinical measurements within normal limits will improve Outcome: Adequate for Discharge Goal: Will remain free from infection 07/15/2020 1103 by Karolee Ohs, RN Outcome: Adequate for Discharge 07/15/2020 0924 by Karolee Ohs, RN Outcome: Progressing Goal: Diagnostic test results will improve Outcome: Adequate for Discharge Goal: Respiratory complications will improve Outcome: Adequate for Discharge Goal: Cardiovascular complication will be avoided Outcome: Adequate for Discharge   Problem: Activity: Goal: Risk for activity intolerance will decrease Outcome: Adequate for Discharge   Problem: Nutrition: Goal: Adequate nutrition will be maintained Outcome: Adequate for Discharge   Problem: Coping: Goal: Level of anxiety will decrease Outcome: Adequate for Discharge   Problem: Elimination: Goal: Will not experience complications related to bowel motility Outcome: Adequate for Discharge Goal: Will not experience complications related to urinary retention Outcome: Adequate for Discharge   Problem: Pain Managment: Goal: General experience of comfort will improve Outcome: Adequate for Discharge   Problem: Safety: Goal: Ability to remain free from injury will improve Outcome: Adequate for Discharge   Problem: Skin Integrity: Goal: Risk for impaired skin integrity will decrease Outcome: Adequate for Discharge   Problem: Activity: Goal: Ability to tolerate  increased activity will improve Outcome: Adequate for Discharge   Problem: Clinical Measurements: Goal: Ability to maintain a body temperature in the normal range will improve Outcome: Adequate for Discharge   Problem: Respiratory: Goal: Ability to maintain adequate ventilation will improve Outcome: Adequate for Discharge Goal: Ability to maintain a clear airway will improve Outcome: Adequate for Discharge   Problem: Education: Goal: Ability to demonstrate management of disease process will improve Outcome: Adequate for Discharge Goal: Ability to verbalize understanding of medication therapies will improve Outcome: Adequate for Discharge Goal: Individualized Educational Video(s) Outcome: Adequate for Discharge   Problem: Activity: Goal: Capacity to carry out activities will improve Outcome: Adequate for Discharge   Problem: Cardiac: Goal: Ability to achieve and maintain adequate cardiopulmonary perfusion will improve Outcome: Adequate for Discharge

## 2020-07-16 NOTE — TOC Transition Note (Signed)
Transition of Care [facility]) - CM/SW Discharge Note   Patient Details  Name: Katherine Lane MRN: [id number] Date of Birth: 11-02-1932  [facility] CM/SW Contact:  Barry Brunner, LCSW Phone Number: 07/16/2020, 10:43 AM   Clinical Narrative:    CSW confirmed with [facility] hospice that they are able to admit patient today. Patient reported that she preferred for her daughter to transport her. CSW notified RN. TOC signing off.   Final next level of care: Hospice Medical Facility Barriers to Discharge: Barriers Resolved   Patient Goals and CMS Choice Patient states their goals for this hospitalization and ongoing recovery are:: Residential Hospice CMS Medicare.gov Compare Post Acute Care list provided to:: Patient Choice offered to / list presented to : Patient  Discharge Placement                Patient to be transferred to facility by: [facility], [facility] Name of family member notified: Dionicia Abler (Daughter)   [phone] Patient and family notified of of transfer: 07/15/20  Discharge Plan and Services                                     Social Determinants of Health (SDOH) Interventions     Readmission Risk Interventions No flowsheet data found.

## 2020-07-31 ENCOUNTER — Telehealth: Payer: Self-pay | Admitting: *Deleted

## 2020-07-31 NOTE — Telephone Encounter (Signed)
Daughter [name] states that her mother took a turn for the worse and ended up in hospital the end of May with fluid build up and pneumonia. Patient was also having problems with pain management. Patient opted for Hospice care and is currently at [facility] house in [place]. They have finally been able to get her pain under control with use of fentanyl patches. Patient has stabilized at [facility] house and her vitals are stable but she is weaker than she ever has been. If the patient remains stable the rest of the week she will be discharged for [facility] house and will be going to [facility] in [place] and Hospice will visit he there.

## 2020-08-16 ENCOUNTER — Telehealth: Payer: Self-pay | Admitting: Family Medicine

## 2020-08-16 ENCOUNTER — Inpatient Hospital Stay
Admission: RE | Admit: 2020-08-16 | Discharge: 2020-09-17 | Disposition: E | Source: Ambulatory Visit | Attending: Internal Medicine | Admitting: Internal Medicine

## 2020-08-16 NOTE — Telephone Encounter (Signed)
Tammy from the [facility] is calling regarding pt. States that pt will be coming to live at the [facility] today and needs shot records

## 2020-08-16 NOTE — Telephone Encounter (Signed)
Vaccines given verbally to Katherine Lane with [facility]

## 2020-08-17 ENCOUNTER — Other Ambulatory Visit: Payer: Self-pay | Admitting: Adult Health

## 2020-08-17 ENCOUNTER — Encounter: Payer: Self-pay | Admitting: Adult Health

## 2020-08-17 ENCOUNTER — Non-Acute Institutional Stay (SKILLED_NURSING_FACILITY): Payer: Medicare Other | Admitting: Adult Health

## 2020-08-17 DIAGNOSIS — G894 Chronic pain syndrome: Secondary | ICD-10-CM

## 2020-08-17 DIAGNOSIS — K5909 Other constipation: Secondary | ICD-10-CM

## 2020-08-17 DIAGNOSIS — I130 Hypertensive heart and chronic kidney disease with heart failure and stage 1 through stage 4 chronic kidney disease, or unspecified chronic kidney disease: Secondary | ICD-10-CM

## 2020-08-17 DIAGNOSIS — I420 Dilated cardiomyopathy: Secondary | ICD-10-CM

## 2020-08-17 DIAGNOSIS — I482 Chronic atrial fibrillation, unspecified: Secondary | ICD-10-CM | POA: Diagnosis not present

## 2020-08-17 DIAGNOSIS — R627 Adult failure to thrive: Secondary | ICD-10-CM

## 2020-08-17 DIAGNOSIS — F419 Anxiety disorder, unspecified: Secondary | ICD-10-CM

## 2020-08-17 DIAGNOSIS — N1832 Chronic kidney disease, stage 3b: Secondary | ICD-10-CM

## 2020-08-17 DIAGNOSIS — I5032 Chronic diastolic (congestive) heart failure: Secondary | ICD-10-CM

## 2020-08-17 DIAGNOSIS — I2609 Other pulmonary embolism with acute cor pulmonale: Secondary | ICD-10-CM | POA: Diagnosis not present

## 2020-08-17 DIAGNOSIS — J9611 Chronic respiratory failure with hypoxia: Secondary | ICD-10-CM

## 2020-08-17 MED ORDER — HYDROMORPHONE HCL 8 MG PO TABS: 8.0000 mg | ORAL_TABLET | ORAL | 0 refills | Status: DC

## 2020-08-17 MED ORDER — FENTANYL 50 MCG/HR TD PT72: 1.0000 | MEDICATED_PATCH | TRANSDERMAL | 0 refills | Status: AC

## 2020-08-17 MED ORDER — LORAZEPAM 0.5 MG PO TABS: 0.5000 mg | ORAL_TABLET | Freq: Three times a day (TID) | ORAL | 0 refills | Status: DC

## 2020-08-17 NOTE — Progress Notes (Signed)
Location:  [place] Room Number: 563-S Place of Service:  SNF (31)   CODE STATUS: DNR  Allergies  Allergen Reactions   Amlodipine Swelling    Leg swelling    Codeine Nausea And Vomiting and Other (See Comments)    Patient states "intolerance to all pain medications"  (NO OPIOIDS) VERY VIOLENT VOMITING!!   Other Nausea And Vomiting and Other (See Comments)    general anesthesia   Tramadol Nausea And Vomiting and Other (See Comments)    "NO OPIOIDS!!! VERY VIOLENT VOMITING!!" per Med History prior to 02/18/17    Chief Complaint  Patient presents with   Acute Visit    Follow-up transfer.    HPI:  She is a 87 year old woman who has been admitted to this facility from hospice home. Her medical history includes: chf; hypertension; chronic pain; failure to thrive. She denies any uncontrolled pain; states the fentanyl patch has done wonders for her back pain. She denies any insomnia; does have lower extremity edema. She will continue to be followed for her chronic illnesses including: Acute pulmonary embolism with acute cor pulmonale  unspecified pulmonary embolism type:    Chronic atrial fibrillation:    Chronic diastolic congestive heart failure/dilated cardiomyopathy  Past Medical History:  Diagnosis Date   Anxiety    Arthritis    Atrial fibrillation (HCC)    CHF (congestive heart failure) (HCC)    Chronic back pain    Complication of anesthesia    Constipation    Depression    Diastolic heart failure (HCC)    Dyspnea    Dysrhythmia    AFib   Essential hypertension    History of blood transfusion    History of cervical fracture    Insomnia    Migraine headache    Osteoporosis    Pneumonia    PONV (postoperative nausea and vomiting)    "patch helped"   Trigeminal neuralgia    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[facility])     Past Surgical History:  Procedure Laterality Date   ANKLE FRACTURE SURGERY Right    BACK SURGERY  2022   CARDIAC  CATHETERIZATION     denies   CATARACT EXTRACTION W/PHACO Right 07/22/2012   Procedure: CATARACT EXTRACTION PHACO AND INTRAOCULAR LENS PLACEMENT ([place]);  Surgeon: Tonny Branch, MD;  Location: [facility];  Service: Ophthalmology;  Laterality: Right;  CDE:  18.93   CATARACT EXTRACTION W/PHACO Left 08/09/2012   Procedure: CATARACT EXTRACTION PHACO AND INTRAOCULAR LENS PLACEMENT ([facility]);  Surgeon: Tonny Branch, MD;  Location: [facility];  Service: Ophthalmology;  Laterality: Left;  CDE: 17.21   COLONOSCOPY     FLEXIBLE SIGMOIDOSCOPY N/A 11/19/2018   Procedure: FLEXIBLE SIGMOIDOSCOPY;  Surgeon: Danie Binder, MD;  Location: [facility];  Service: Endoscopy;  Laterality: N/A;  7:30am   HARDWARE REMOVAL  07/18/2011   Procedure: HARDWARE REMOVAL; Right leg-  Surgeon: Carole Civil, MD;  Location: [facility];  Service: Orthopedics;  Laterality: Right;   HEMORRHOID BANDING N/A 11/19/2018   Procedure: Thayer Jew;  Surgeon: Danie Binder, MD;  [facility];  Service: Endoscopy;  Laterality: N/A;   KYPHOPLASTY N/A 12/26/2016   Procedure: Lumbar two Lumbar three and Lumbar five KYPHOPLASTY;  Surgeon: Erline Levine, MD;  Location: [place];  Service: Neurosurgery;  Laterality: N/A;   KYPHOPLASTY N/A 02/19/2017   Procedure: KYPHOPLASTY LUMBAR ONE;  Surgeon: Erline Levine, MD;  Location: [place];  Service: Neurosurgery;  Laterality: N/A;  KYPHOPLASTY LUMBAR  ONE   KYPHOPLASTY N/A 04/20/2020   Procedure: Thoracic eleven, Thoracic twelve Kyphoplasty;  Surgeon: Erline Levine, MD;  Location: [place];  Service: Neurosurgery;  Laterality: N/A;   KYPHOPLASTY N/A 05/15/2020   Procedure: THORACIC NINE, THORACIC TEN  KYPHOPLASTY;  Surgeon: Erline Levine, MD;  Location: [place];  Service: Neurosurgery;  Laterality: N/A;   NERVE REPAIR  07/18/2011   Procedure: NERVE REPAIR;  Surgeon: Carole Civil, MD;  Location: [facility];  Service: Orthopedics;  Laterality: Right;  Right leg superficial peroneal nerve release     PARTIAL  HYSTERECTOMY      Social History   Socioeconomic History   Marital status: Widowed    Spouse name: Not on file   Number of children: 3   Years of education: Not on file   Highest education level: Not on file  Occupational History   Occupation: retired    Fish farm manager: RETIRED  Tobacco Use   Smoking status: Never   Smokeless tobacco: Never  Vaping Use   Vaping Use: Never used  Substance and Sexual Activity   Alcohol use: No    Alcohol/week: 0.0 standard drinks   Drug use: No   Sexual activity: Not Currently    Birth control/protection: Surgical    Comment: hyst  Other Topics Concern   Not on file  Social History Narrative   Not on file   Social Determinants of Health   Financial Resource Strain: Not on file  Food Insecurity: Not on file  Transportation Needs: Not on file  Physical Activity: Not on file  Stress: Not on file  Social Connections: Not on file  Intimate Partner Violence: Not on file   Family History  Problem Relation Age of Onset   Hypertension Mother    Alzheimer's disease Mother    Cancer Mother    Hypertension Father    Other Father        abused pain meds   Diabetes Brother    Other Brother        back problems   Diabetes Daughter    Anesthesia problems Neg Hx    Hypotension Neg Hx    Malignant hyperthermia Neg Hx    Pseudochol deficiency Neg Hx       VITAL SIGNS BP 136/83   Pulse 88   Temp (!) 97 F (36.1 C)   SpO2 96%   Outpatient Encounter Medications as of 08/17/2020  Medication Sig   apixaban (ELIQUIS) 5 MG TABS tablet Take 5 mg by mouth 2 (two) times daily.   bisacodyl (DULCOLAX) 10 MG suppository Place 10 mg rectally as needed for moderate constipation, severe constipation or mild constipation. Once A Day Every 3 Days   Dextran 70-Hypromellose (ARTIFICIAL TEARS) 0.1-0.3 % SOLN Apply 1 drop to eye in the morning, at noon, in the evening, and at bedtime.   diphenhydrAMINE (BENADRYL) 25 MG tablet Take 25 mg by mouth every 6 (six)  hours as needed for itching.   fentaNYL (DURAGESIC) 50 MCG/HR Place onto the skin every 3 (three) days. Special Instructions: Rotate sites and remove old patch prior to placing new patch. Once A Day Every 3 Days   furosemide (LASIX) 40 MG tablet Take 1 tablet (40 mg total) by mouth 2 (two) times daily.   hydrocortisone cream 1 % Apply 1 application topically 4 (four) times daily. Apply to rectal area qid prn hemorrhoids.   HYDROmorphone (DILAUDID) 8 MG tablet Take 8 mg by mouth every 3 (three) hours.   lactulose ([place])  10 GM/15ML solution Take 10 g by mouth 2 (two) times daily as needed for mild constipation, moderate constipation or severe constipation. 30 mls   LORazepam (ATIVAN) 0.5 MG tablet Take 0.5 mg by mouth 3 (three) times daily. As needed for inability to relax, nervousness, agitation x 14 days and then provider to reassess need. Attempt and document a non-drug intervention and outcome prior to administering medication.   losartan (COZAAR) 50 MG tablet Take 0.5 tablets (25 mg total) by mouth daily.   metoprolol succinate (TOPROL-XL) 100 MG 24 hr tablet TAKE ONE TABLET BY MOUTH ONCE DAILY. (Patient taking differently: Take 100 mg by mouth daily.)   NON FORMULARY Diet:Regular   ondansetron (ZOFRAN-ODT) 4 MG disintegrating tablet Take 4 mg by mouth every 6 (six) hours as needed for nausea or vomiting.   simethicone (MYLICON) 638 MG chewable tablet Chew 125 mg by mouth every 4 (four) hours as needed for flatulence.   No facility-administered encounter medications on file as of 08/17/2020.     SIGNIFICANT DIAGNOSTIC EXAMS  TODAY  07-13-20: wbc 6.1; hgb 11.0; hct 37.3; mcv 89.7 plt 333 glucose 86; bun 26; creat 1.33; k+ 4.6; na++ 135; ca 8.4 GFR 39; alk phos 153; albumin 3.0   Review of Systems  Constitutional:  Negative for malaise/fatigue.  Respiratory:  Negative for cough and shortness of breath.   Cardiovascular:  Negative for chest pain, palpitations and leg swelling.   Gastrointestinal:  Negative for abdominal pain, constipation and heartburn.  Musculoskeletal:  Negative for back pain, joint pain and myalgias.  Skin: Negative.   Neurological:  Negative for dizziness.  Psychiatric/Behavioral:  The patient is not nervous/anxious.    Physical Exam Constitutional:      General: She is not in acute distress.    Appearance: She is well-developed. She is not diaphoretic.     Comments: thin  Neck:     Thyroid: No thyromegaly.  Cardiovascular:     Rate and Rhythm: Normal rate and regular rhythm.     Pulses: Normal pulses.     Heart sounds: Normal heart sounds.  Pulmonary:     Effort: Pulmonary effort is normal. No respiratory distress.     Breath sounds: Normal breath sounds.  Abdominal:     General: Bowel sounds are normal. There is no distension.     Palpations: Abdomen is soft.     Tenderness: There is no abdominal tenderness.  Musculoskeletal:     Cervical back: Neck supple.     Right lower leg: Edema present.     Left lower leg: Edema present.     Comments: Is able to move all extremities Has 1-2+ bilateral lower extremity  Kyphosis: history of multiple kyphoplasties   Lymphadenopathy:     Cervical: No cervical adenopathy.  Skin:    General: Skin is warm and dry.     Comments: Skin fragile   Neurological:     Mental Status: She is alert. Mental status is at baseline.  Psychiatric:        Mood and Affect: Mood normal.     ASSESSMENT/ PLAN:  TODAY  Failure to thrive in adult: is without change; will monitor has supplements as directed  2. Acute pulmonary embolism with acute cor pulmonale  unspecified pulmonary embolism type: she is stable is on long term eliquis 5 mg twice daily is on toprol xl 100 mg daily for rate control  3. Chronic atrial fibrillation: heart rate is stable will continue toprol xl 100 mg daily for  rate control and eliquis 5 mg twice daily  4. Chronic diastolic congestive heart failure/dilated cardiomyopathy: is  without change: will continue lasix 40 mg daily; toprol xl 100 mg daily cozaar 25 mg daily   5. Chronic respiratory failure with hypoxia: is stable is on chronic 02.   6.  Benign hypertensive heart and renal disease with CHF: is stable b/p 136/83 will continue cozaar 25 mg daily toprol xl 100 mg daily   7. Chronic pain syndrome: her back pain is presently managed: will continue duragesc 50 mcg patch every 3 days; dilaudid 8 mg every 3 hours as needed  8. Chronic anxiety: is without change: will continue ativan 0.5 mg three times daily as needed  9. Chronic constipation: is stable will continue lactulose 10 gm twice daily as needed  10. Stage 3b chronic kidney disease: is without change: bun 26; creat 1.33; GFR 39    Will check cbc; cmp.   Time spent with patient 45 minutes: goals of care medication reconciliation; hospice needs.    Ok Edwards NP [facility]  Contact [phone] Monday through Friday 8am- 5pm  After hours call [phone]

## 2020-08-17 DEATH — deceased

## 2020-08-21 ENCOUNTER — Other Ambulatory Visit (HOSPITAL_COMMUNITY)
Admission: RE | Admit: 2020-08-21 | Discharge: 2020-08-21 | Disposition: A | Discharge status: Home / Self Care | Source: Skilled Nursing Facility | Attending: Adult Health | Admitting: Adult Health

## 2020-08-21 DIAGNOSIS — I10 Essential (primary) hypertension: Secondary | ICD-10-CM | POA: Insufficient documentation

## 2020-08-21 LAB — CBC
HCT: 43.8 % (ref 36.0–46.0)
Hemoglobin: 13.1 g/dL (ref 12.0–15.0)
MCH: 26.1 pg (ref 26.0–34.0)
MCHC: 29.9 g/dL — ABNORMAL LOW (ref 30.0–36.0)
MCV: 87.3 fL (ref 80.0–100.0)
Platelets: 283 10*3/uL (ref 150–400)
RBC: 5.02 MIL/uL (ref 3.87–5.11)
RDW: 19.1 % — ABNORMAL HIGH (ref 11.5–15.5)
WBC: 5.4 10*3/uL (ref 4.0–10.5)
nRBC: 0 % (ref 0.0–0.2)

## 2020-08-21 LAB — COMPREHENSIVE METABOLIC PANEL
ALT: 12 U/L (ref 0–44)
AST: 26 U/L (ref 15–41)
Albumin: 3.4 g/dL — ABNORMAL LOW (ref 3.5–5.0)
Alkaline Phosphatase: 94 U/L (ref 38–126)
Anion gap: 10 (ref 5–15)
BUN: 34 mg/dL — ABNORMAL HIGH (ref 8–23)
CO2: 28 mmol/L (ref 22–32)
Calcium: 8.7 mg/dL — ABNORMAL LOW (ref 8.9–10.3)
Chloride: 101 mmol/L (ref 98–111)
Creatinine, Ser: 1.46 mg/dL — ABNORMAL HIGH (ref 0.44–1.00)
GFR, Estimated: 35 mL/min — ABNORMAL LOW (ref >60)
Glucose, Bld: 89 mg/dL (ref 70–99)
Potassium: 4 mmol/L (ref 3.5–5.1)
Sodium: 139 mmol/L (ref 135–145)
Total Bilirubin: 1.4 mg/dL — ABNORMAL HIGH (ref 0.3–1.2)
Total Protein: 6.1 g/dL — ABNORMAL LOW (ref 6.5–8.1)

## 2020-08-22 ENCOUNTER — Encounter: Payer: Self-pay | Admitting: Adult Health

## 2020-08-22 ENCOUNTER — Non-Acute Institutional Stay (SKILLED_NURSING_FACILITY): Payer: Medicare Other | Admitting: Adult Health

## 2020-08-22 DIAGNOSIS — I5032 Chronic diastolic (congestive) heart failure: Secondary | ICD-10-CM

## 2020-08-22 NOTE — Progress Notes (Signed)
Location:  [place] Room Number: 916-X Place of Service:  SNF (31)   CODE STATUS: DNR  Allergies  Allergen Reactions  . Amlodipine Swelling    Leg swelling   . Codeine Nausea And Vomiting and Other (See Comments)    Patient states "intolerance to all pain medications"  (NO OPIOIDS) VERY VIOLENT VOMITING!!  . Other Nausea And Vomiting and Other (See Comments)    general anesthesia  . Tramadol Nausea And Vomiting and Other (See Comments)    "NO OPIOIDS!!! VERY VIOLENT VOMITING!!" per Med History prior to 02/18/17    Chief Complaint  Patient presents with  . Acute Visit    Congestive heart failure concerns     HPI:  Her family is worried about the amount of edema she has in her lower extremities. She denies any increase in cough; no worsening shortness of breath. She continues on twice daily lasix. She is on chronic 02 without distress present. There are no reports of fever present. She continues to be followed by hospice care.    Past Medical History:  Diagnosis Date  . Anxiety   . Arthritis   . Atrial fibrillation ([place])   . CHF (congestive heart failure) ([place])   . Chronic back pain   . Complication of anesthesia   . Constipation   . Depression   . Diastolic heart failure ([place])   . Dyspnea   . Dysrhythmia    AFib  . Essential hypertension   . History of blood transfusion   . History of cervical fracture   . Insomnia   . Migraine headache   . Osteoporosis   . Pneumonia   . PONV (postoperative nausea and vomiting)    "patch helped"  . Trigeminal neuralgia   . 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[facility])     Past Surgical History:  Procedure Laterality Date  . ANKLE FRACTURE SURGERY Right   . BACK SURGERY  2022  . CARDIAC CATHETERIZATION     denies  . CATARACT EXTRACTION W/PHACO Right 07/22/2012   Procedure: CATARACT EXTRACTION PHACO AND INTRAOCULAR LENS PLACEMENT (IOC);  Surgeon: Tonny Branch, MD;  Location: [facility];  Service: Ophthalmology;   Laterality: Right;  CDE:  18.93  . CATARACT EXTRACTION W/PHACO Left 08/09/2012   Procedure: CATARACT EXTRACTION PHACO AND INTRAOCULAR LENS PLACEMENT (IOC);  Surgeon: Tonny Branch, MD;  Location: [facility];  Service: Ophthalmology;  Laterality: Left;  CDE: 17.21  . COLONOSCOPY    . FLEXIBLE SIGMOIDOSCOPY N/A 11/19/2018   Procedure: FLEXIBLE SIGMOIDOSCOPY;  Surgeon: Danie Binder, MD;  [facility];  Service: Endoscopy;  Laterality: N/A;  7:30am  . HARDWARE REMOVAL  07/18/2011   Procedure: HARDWARE REMOVAL; Right leg-  Surgeon: Carole Civil, MD;  Location: [facility];  Service: Orthopedics;  Laterality: Right;  . HEMORRHOID BANDING N/A 11/19/2018   Procedure: HEMORRHOID BANDING;  Surgeon: Danie Binder, MD;  [facility];  Service: Endoscopy;  Laterality: N/A;  . KYPHOPLASTY N/A 12/26/2016   Procedure: Lumbar two Lumbar three and Lumbar five KYPHOPLASTY;  Surgeon: Erline Levine, MD;  Location: [place];  Service: Neurosurgery;  Laterality: N/A;  . KYPHOPLASTY N/A 02/19/2017   Procedure: KYPHOPLASTY LUMBAR ONE;  Surgeon: Erline Levine, MD;  Location: [place];  Service: Neurosurgery;  Laterality: N/A;  KYPHOPLASTY LUMBAR ONE  . KYPHOPLASTY N/A 04/20/2020   Procedure: Thoracic eleven, Thoracic twelve Kyphoplasty;  Surgeon: Erline Levine, MD;  Location: [place];  Service: Neurosurgery;  Laterality: N/A;  .  KYPHOPLASTY N/A 05/15/2020   Procedure: THORACIC NINE, THORACIC TEN  KYPHOPLASTY;  Surgeon: Erline Levine, MD;  Location: [place];  Service: Neurosurgery;  Laterality: N/A;  . NERVE REPAIR  07/18/2011   Procedure: NERVE REPAIR;  Surgeon: Carole Civil, MD;  [facility];  Service: Orthopedics;  Laterality: Right;  Right leg superficial peroneal nerve release    . PARTIAL HYSTERECTOMY      Social History   Socioeconomic History  . Marital status: Widowed    Spouse name: Not on file  . Number of children: 3  . Years of education: Not on file  . Highest education level: Not on file   Occupational History  . Occupation: retired    Fish farm manager: RETIRED  Tobacco Use  . Smoking status: Never  . Smokeless tobacco: Never  Vaping Use  . Vaping Use: Never used  Substance and Sexual Activity  . Alcohol use: No    Alcohol/week: 0.0 standard drinks  . Drug use: No  . Sexual activity: Not Currently    Birth control/protection: Surgical    Comment: hyst  Other Topics Concern  . Not on file  Social History Narrative  . Not on file   Social Determinants of Health   Financial Resource Strain: Not on file  Food Insecurity: Not on file  Transportation Needs: Not on file  Physical Activity: Not on file  Stress: Not on file  Social Connections: Not on file  Intimate Partner Violence: Not on file   Family History  Problem Relation Age of Onset  . Hypertension Mother   . Alzheimer's disease Mother   . Cancer Mother   . Hypertension Father   . Other Father        abused pain meds  . Diabetes Brother   . Other Brother        back problems  . Diabetes Daughter   . Anesthesia problems Neg Hx   . Hypotension Neg Hx   . Malignant hyperthermia Neg Hx   . Pseudochol deficiency Neg Hx       VITAL SIGNS BP (!) 146/68   Pulse 74   Temp (!) 97.1 F (36.2 C)   Resp 18   Ht 5' 2.5" (1.588 m)   Wt 113 lb 6.4 oz (51.4 kg)   SpO2 96%   BMI 20.41 kg/m   Outpatient Encounter Medications as of 08/22/2020  Medication Sig  . apixaban (ELIQUIS) 5 MG TABS tablet Take 5 mg by mouth 2 (two) times daily.  . bisacodyl (DULCOLAX) 10 MG suppository Place 10 mg rectally as needed for moderate constipation, severe constipation or mild constipation. Once A Day Every 3 Days  . Dextran 70-Hypromellose (ARTIFICIAL TEARS) 0.1-0.3 % SOLN Apply 1 drop to eye in the morning, at noon, in the evening, and at bedtime.  . diphenhydrAMINE (BENADRYL) 25 MG tablet Take 25 mg by mouth every 6 (six) hours as needed for itching.  . fentaNYL (DURAGESIC) 50 MCG/HR Place 1 patch onto the skin every 3  (three) days. Special Instructions: Rotate sites and remove old patch prior to placing new patch. Once A Day Every 3 Days  . furosemide (LASIX) 40 MG tablet Take 40 mg by mouth 3 (three) times daily. 9 am, 6 pm, and 9 pm  . hydrocortisone cream 1 % Apply 1 application topically 4 (four) times daily. Apply to rectal area qid prn hemorrhoids.  Marland Kitchen HYDROmorphone (DILAUDID) 8 MG tablet Take 8 mg by mouth as directed. Every 3 hours as needed  for moderate to severe pain  . lactulose (CHRONULAC) 10 GM/15ML solution Take 10 g by mouth 2 (two) times daily as needed for mild constipation, moderate constipation or severe constipation. 30 mls  . LORazepam (ATIVAN) 0.5 MG tablet Take 1 tablet (0.5 mg total) by mouth 3 (three) times daily. As needed for inability to relax, nervousness, agitation x 14 days and then provider to reassess need. Attempt and document a non-drug intervention and outcome prior to administering medication.  Marland Kitchen losartan (COZAAR) 25 MG tablet Take 25 mg by mouth daily.  . metoprolol succinate (TOPROL-XL) 100 MG 24 hr tablet TAKE ONE TABLET BY MOUTH ONCE DAILY.  . NON FORMULARY Diet:Regular  . Nutritional Supplements (ENSURE ENLIVE PO) Take 1 Can by mouth daily at 12 noon. Prefers vanilla flavor please  . ondansetron (ZOFRAN-ODT) 4 MG disintegrating tablet Take 4 mg by mouth every 6 (six) hours as needed for nausea or vomiting.  . OXYGEN Inhale 2 L into the lungs continuous. To keep O2 above 90 %  . polyethylene glycol (MIRALAX / GLYCOLAX) 17 g packet Take 17 g by mouth daily as needed.  . potassium chloride SA (KLOR-CON) 20 MEQ tablet Take 20 mEq by mouth daily. Give with food and full glass of liquid Do not crush  . simethicone (MYLICON) 237 MG chewable tablet Chew 125 mg by mouth every 4 (four) hours as needed for flatulence.  . [DISCONTINUED] furosemide (LASIX) 40 MG tablet Take 1 tablet (40 mg total) by mouth 2 (two) times daily.  . [DISCONTINUED] HYDROmorphone (DILAUDID) 8 MG tablet Take 1  tablet (8 mg total) by mouth every 3 (three) hours.  . [DISCONTINUED] losartan (COZAAR) 50 MG tablet Take 0.5 tablets (25 mg total) by mouth daily.   No facility-administered encounter medications on file as of 08/22/2020.     SIGNIFICANT DIAGNOSTIC EXAMS   PREVIOUS   07-13-20: wbc 6.1; hgb 11.0; hct 37.3; mcv 89.7 plt 333 glucose 86; bun 26; creat 1.33; k+ 4.6; na++ 135; ca 8.4 GFR 39; alk phos 153; albumin 3.0   TODAY  08-21-20: wbc 5.4; hgb 13.1; hct 13.1; mcv 87.3 plt 283; glucose 89; bun 34; creat 1.46; k+ 4.0; na++ 139; ca 8.7; GFR 35 total bili 1.4; albumin 3.4    Review of Systems  Constitutional:  Negative for malaise/fatigue.  Respiratory:  Negative for cough and shortness of breath.   Cardiovascular:  Positive for leg swelling. Negative for chest pain and palpitations.  Gastrointestinal:  Negative for abdominal pain, constipation and heartburn.  Musculoskeletal:  Negative for back pain, joint pain and myalgias.  Skin: Negative.   Neurological:  Negative for dizziness.  Psychiatric/Behavioral:  The patient is not nervous/anxious.      Physical Exam Constitutional:      General: She is not in acute distress.    Appearance: She is well-developed. She is not diaphoretic.     Comments: thin  Neck:     Thyroid: No thyromegaly.  Cardiovascular:     Rate and Rhythm: Normal rate and regular rhythm.     Heart sounds: Normal heart sounds.  Pulmonary:     Effort: Pulmonary effort is normal. No respiratory distress.     Breath sounds: Normal breath sounds.  Abdominal:     General: Bowel sounds are normal. There is no distension.     Palpations: Abdomen is soft.     Tenderness: There is no abdominal tenderness.  Musculoskeletal:     Cervical back: Neck supple.     Right  lower leg: Edema present.     Left lower leg: Edema present.     Comments: Is able to move all extremities Has 2+ bilateral lower extremity Kyphosis: history of multiple kyphoplasties  Lymphadenopathy:      Cervical: No cervical adenopathy.  Skin:    General: Skin is warm and dry.     Comments: Skin fragile   Neurological:     Mental Status: She is alert. Mental status is at baseline.  Psychiatric:        Mood and Affect: Mood normal.     ASSESSMENT/ PLAN:  TODAY  Chronic diastolic congestive heart failure  Will give her an extra dose of 40 mg of lasix to 2 days will monitor her status.  Since she is followed by hospice care will focus upon comfort.    Ok Edwards NP [facility]  Contact [phone] Monday through Friday 8am- 5pm  After hours call [phone]

## 2020-08-24 ENCOUNTER — Other Ambulatory Visit: Payer: Self-pay | Admitting: Adult Health

## 2020-08-24 DIAGNOSIS — I130 Hypertensive heart and chronic kidney disease with heart failure and stage 1 through stage 4 chronic kidney disease, or unspecified chronic kidney disease: Secondary | ICD-10-CM | POA: Insufficient documentation

## 2020-08-24 DIAGNOSIS — J9611 Chronic respiratory failure with hypoxia: Secondary | ICD-10-CM | POA: Insufficient documentation

## 2020-08-24 DIAGNOSIS — R627 Adult failure to thrive: Secondary | ICD-10-CM | POA: Insufficient documentation

## 2020-08-24 DIAGNOSIS — G894 Chronic pain syndrome: Secondary | ICD-10-CM | POA: Insufficient documentation

## 2020-08-27 ENCOUNTER — Non-Acute Institutional Stay (SKILLED_NURSING_FACILITY): Payer: Medicare Other | Admitting: Adult Health

## 2020-08-27 ENCOUNTER — Encounter: Payer: Self-pay | Admitting: Adult Health

## 2020-08-27 DIAGNOSIS — F419 Anxiety disorder, unspecified: Secondary | ICD-10-CM | POA: Diagnosis not present

## 2020-08-27 NOTE — Progress Notes (Signed)
Location:  [place] Room Number: 010-O Place of Service:  SNF (31)   CODE STATUS: DNR  Allergies  Allergen Reactions  . Amlodipine Swelling    Leg swelling   . Codeine Nausea And Vomiting and Other (See Comments)    Patient states "intolerance to all pain medications"  (NO OPIOIDS) VERY VIOLENT VOMITING!!  . Other Nausea And Vomiting and Other (See Comments)    general anesthesia  . Tramadol Nausea And Vomiting and Other (See Comments)    "NO OPIOIDS!!! VERY VIOLENT VOMITING!!" per Med History prior to 02/18/17    Chief Complaint  Patient presents with  . Acute Visit    Mood state    HPI:  Staff report that she is emotional over the past several days. She is having periods of being tearful. She tells that she feels anxious and nervous. She denies any depressive thoughts at this time. She has ativan ordered as needed; she has been using this at night for her anxiety on a fairly regular basis. She continues to be followed by hospice care.   Past Medical History:  Diagnosis Date  . Anxiety   . Arthritis   . Atrial fibrillation ([place])   . CHF (congestive heart failure) ([place])   . Chronic back pain   . Complication of anesthesia   . Constipation   . Depression   . Diastolic heart failure ([place])   . Dyspnea   . Dysrhythmia    AFib  . Essential hypertension   . History of blood transfusion   . History of cervical fracture   . Insomnia   . Migraine headache   . Osteoporosis   . Pneumonia   . PONV (postoperative nausea and vomiting)    "patch helped"  . Trigeminal neuralgia   . 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[facility])     Past Surgical History:  Procedure Laterality Date  . ANKLE FRACTURE SURGERY Right   . BACK SURGERY  2022  . CARDIAC CATHETERIZATION     denies  . CATARACT EXTRACTION W/PHACO Right 07/22/2012   Procedure: CATARACT EXTRACTION PHACO AND INTRAOCULAR LENS PLACEMENT (IOC);  Surgeon: Tonny Branch, MD;  Location: [facility];  Service:  Ophthalmology;  Laterality: Right;  CDE:  18.93  . CATARACT EXTRACTION W/PHACO Left 08/09/2012   Procedure: CATARACT EXTRACTION PHACO AND INTRAOCULAR LENS PLACEMENT (IOC);  Surgeon: Tonny Branch, MD;  Location: [facility];  Service: Ophthalmology;  Laterality: Left;  CDE: 17.21  . COLONOSCOPY    . FLEXIBLE SIGMOIDOSCOPY N/A 11/19/2018   Procedure: FLEXIBLE SIGMOIDOSCOPY;  Surgeon: Danie Binder, MD;  [facility];  Service: Endoscopy;  Laterality: N/A;  7:30am  . HARDWARE REMOVAL  07/18/2011   Procedure: HARDWARE REMOVAL; Right leg-  Surgeon: Carole Civil, MD;  Location: [facility];  Service: Orthopedics;  Laterality: Right;  . HEMORRHOID BANDING N/A 11/19/2018   Procedure: HEMORRHOID BANDING;  Surgeon: Danie Binder, MD;  [facility];  Service: Endoscopy;  Laterality: N/A;  . KYPHOPLASTY N/A 12/26/2016   Procedure: Lumbar two Lumbar three and Lumbar five KYPHOPLASTY;  Surgeon: Erline Levine, MD;  Location: [place];  Service: Neurosurgery;  Laterality: N/A;  . KYPHOPLASTY N/A 02/19/2017   Procedure: KYPHOPLASTY LUMBAR ONE;  Surgeon: Erline Levine, MD;  Location: [place];  Service: Neurosurgery;  Laterality: N/A;  KYPHOPLASTY LUMBAR ONE  . KYPHOPLASTY N/A 04/20/2020   Procedure: Thoracic eleven, Thoracic twelve Kyphoplasty;  Surgeon: Erline Levine, MD;  Location: [place];  Service: Neurosurgery;  Laterality: N/A;  . KYPHOPLASTY N/A 05/15/2020   Procedure: THORACIC NINE, THORACIC TEN  KYPHOPLASTY;  Surgeon: Erline Levine, MD;  Location: [place];  Service: Neurosurgery;  Laterality: N/A;  . NERVE REPAIR  07/18/2011   Procedure: NERVE REPAIR;  Surgeon: Carole Civil, MD;  Location: [facility];  Service: Orthopedics;  Laterality: Right;  Right leg superficial peroneal nerve release    . PARTIAL HYSTERECTOMY      Social History   Socioeconomic History  . Marital status: Widowed    Spouse name: Not on file  . Number of children: 3  . Years of education: Not on file  . Highest education  level: Not on file  Occupational History  . Occupation: retired    Fish farm manager: RETIRED  Tobacco Use  . Smoking status: Never  . Smokeless tobacco: Never  Vaping Use  . Vaping Use: Never used  Substance and Sexual Activity  . Alcohol use: No    Alcohol/week: 0.0 standard drinks  . Drug use: No  . Sexual activity: Not Currently    Birth control/protection: Surgical    Comment: hyst  Other Topics Concern  . Not on file  Social History Narrative  . Not on file   Social Determinants of Health   Financial Resource Strain: Not on file  Food Insecurity: Not on file  Transportation Needs: Not on file  Physical Activity: Not on file  Stress: Not on file  Social Connections: Not on file  Intimate Partner Violence: Not on file   Family History  Problem Relation Age of Onset  . Hypertension Mother   . Alzheimer's disease Mother   . Cancer Mother   . Hypertension Father   . Other Father        abused pain meds  . Diabetes Brother   . Other Brother        back problems  . Diabetes Daughter   . Anesthesia problems Neg Hx   . Hypotension Neg Hx   . Malignant hyperthermia Neg Hx   . Pseudochol deficiency Neg Hx       VITAL SIGNS BP 106/61   Pulse 70   Temp (!) 97.3 F (36.3 C)   Resp 20   Ht 5' 2.5" (1.588 m)   Wt 113 lb 6.4 oz (51.4 kg)   SpO2 99%   BMI 20.41 kg/m   Outpatient Encounter Medications as of 08/27/2020  Medication Sig  . apixaban (ELIQUIS) 5 MG TABS tablet Take 5 mg by mouth 2 (two) times daily.  . bisacodyl (DULCOLAX) 10 MG suppository Place 10 mg rectally as needed for moderate constipation, severe constipation or mild constipation. Once A Day Every 3 Days  . Dextran 70-Hypromellose (ARTIFICIAL TEARS) 0.1-0.3 % SOLN Apply 1 drop to eye in the morning, at noon, in the evening, and at bedtime.  . diphenhydrAMINE (BENADRYL) 25 MG tablet Take 25 mg by mouth every 6 (six) hours as needed for itching.  . fentaNYL (DURAGESIC) 50 MCG/HR Place 1 patch onto the  skin every 3 (three) days. Special Instructions: Rotate sites and remove old patch prior to placing new patch. Once A Day Every 3 Days  . furosemide (LASIX) 40 MG tablet Take 40 mg by mouth 3 (three) times daily. 9 am, 6 pm, and 9 pm  . hydrocortisone cream 1 % Apply 1 application topically 4 (four) times daily. Apply to rectal area qid prn hemorrhoids.  Marland Kitchen HYDROmorphone (DILAUDID) 8 MG tablet Take 8 mg by mouth as directed. Every 3  hours as needed for moderate to severe pain  . lactulose (CHRONULAC) 10 GM/15ML solution Take 10 g by mouth 2 (two) times daily as needed for mild constipation, moderate constipation or severe constipation. 30 mls  . LORazepam (ATIVAN) 0.5 MG tablet Take 1 tablet (0.5 mg total) by mouth 3 (three) times daily. As needed for inability to relax, nervousness, agitation x 14 days and then provider to reassess need. Attempt and document a non-drug intervention and outcome prior to administering medication.  Marland Kitchen losartan (COZAAR) 25 MG tablet Take 25 mg by mouth daily.  . metoprolol succinate (TOPROL-XL) 100 MG 24 hr tablet TAKE ONE TABLET BY MOUTH ONCE DAILY.  . NON FORMULARY Diet:Regular  . Nutritional Supplements (ENSURE ENLIVE PO) Take 1 Can by mouth daily at 12 noon. Prefers vanilla flavor please  . ondansetron (ZOFRAN-ODT) 4 MG disintegrating tablet Take 4 mg by mouth every 6 (six) hours as needed for nausea or vomiting.  . OXYGEN Inhale 2 L into the lungs continuous. To keep O2 above 90 %  . polyethylene glycol (MIRALAX / GLYCOLAX) 17 g packet Take 17 g by mouth daily as needed.  . potassium chloride SA (KLOR-CON) 20 MEQ tablet Take 20 mEq by mouth daily. Give with food and full glass of liquid Do not crush  . simethicone (MYLICON) 740 MG chewable tablet Chew 125 mg by mouth every 4 (four) hours as needed for flatulence.   No facility-administered encounter medications on file as of 08/27/2020.     SIGNIFICANT DIAGNOSTIC EXAMS   PREVIOUS   07-13-20: wbc 6.1; hgb 11.0;  hct 37.3; mcv 89.7 plt 333 glucose 86; bun 26; creat 1.33; k+ 4.6; na++ 135; ca 8.4 GFR 39; alk phos 153; albumin 3.0  08-21-20: wbc 5.4; hgb 13.1; hct 13.1; mcv 87.3 plt 283; glucose 89; bun 34; creat 1.46; k+ 4.0; na++ 139; ca 8.7; GFR 35 total bili 1.4; albumin 3.4   NO NEW LABS.   Review of Systems  Constitutional:  Negative for malaise/fatigue.  Respiratory:  Negative for cough and shortness of breath.   Cardiovascular:  Positive for leg swelling. Negative for chest pain and palpitations.  Gastrointestinal:  Negative for abdominal pain, constipation and heartburn.  Musculoskeletal:  Negative for back pain, joint pain and myalgias.  Skin: Negative.   Neurological:  Negative for dizziness.  Psychiatric/Behavioral:  Negative for depression. The patient is nervous/anxious.     Physical Exam Constitutional:      General: She is not in acute distress.    Appearance: She is well-developed. She is not diaphoretic.  Neck:     Thyroid: No thyromegaly.  Cardiovascular:     Rate and Rhythm: Normal rate. Rhythm irregular.     Pulses: Normal pulses.     Heart sounds: Normal heart sounds.  Pulmonary:     Effort: Pulmonary effort is normal. No respiratory distress.     Breath sounds: Normal breath sounds.  Abdominal:     General: Bowel sounds are normal. There is no distension.     Palpations: Abdomen is soft.     Tenderness: There is no abdominal tenderness.  Musculoskeletal:     Cervical back: Neck supple.     Right lower leg: Edema present.     Left lower leg: Edema present.     Comments:  Is able to move all extremities Has 2+ bilateral lower extremity Kyphosis: history of multiple kyphoplasties   Lymphadenopathy:     Cervical: No cervical adenopathy.  Skin:    General:  Skin is warm and dry.     Comments: Fragile   Neurological:     Mental Status: She is alert. Mental status is at baseline.  Psychiatric:        Mood and Affect: Mood normal.      ASSESSMENT/  PLAN:  TODAY  Chronic anxiety: is worse; will change her ativan to 0.5 mg nightly on a routine basis. Will need to consider SSRI if this does not improve her anxiety level.    Ok Edwards NP [facility]  Contact [phone] Monday through Friday 8am- 5pm  After hours call [phone]

## 2020-08-29 ENCOUNTER — Encounter: Payer: Self-pay | Admitting: Internal Medicine

## 2020-08-29 ENCOUNTER — Non-Acute Institutional Stay (SKILLED_NURSING_FACILITY): Admitting: Internal Medicine

## 2020-08-29 DIAGNOSIS — N1832 Chronic kidney disease, stage 3b: Secondary | ICD-10-CM | POA: Diagnosis not present

## 2020-08-29 DIAGNOSIS — I482 Chronic atrial fibrillation, unspecified: Secondary | ICD-10-CM | POA: Diagnosis not present

## 2020-08-29 DIAGNOSIS — I5032 Chronic diastolic (congestive) heart failure: Secondary | ICD-10-CM

## 2020-08-29 DIAGNOSIS — I10 Essential (primary) hypertension: Secondary | ICD-10-CM | POA: Diagnosis not present

## 2020-08-29 DIAGNOSIS — E46 Unspecified protein-calorie malnutrition: Secondary | ICD-10-CM | POA: Insufficient documentation

## 2020-08-29 DIAGNOSIS — E441 Mild protein-calorie malnutrition: Secondary | ICD-10-CM

## 2020-08-29 NOTE — Assessment & Plan Note (Signed)
Current creat 1.46/ GFR 35 CKD Stage 3b Medication List reviewed If CKD progresses  ARB will be weaned or discontinued.

## 2020-08-29 NOTE — Assessment & Plan Note (Addendum)
No NVD or HJR. Residual edema.Trial of once weekly Zaroxylyn discussed w NP.

## 2020-08-29 NOTE — Assessment & Plan Note (Addendum)
BP controlled; no change in antihypertensive medications Consider D/C of ARB if CKD progresses Carvedilol an option in place of B blocker if CHF progresses

## 2020-08-29 NOTE — Assessment & Plan Note (Addendum)
Current albumin 3.4 / total protein 6.1 Continue Ensure Alive. This may help edema

## 2020-08-29 NOTE — Patient Instructions (Signed)
See assessment and plan under each diagnosis in the problem list and acutely for this visit 

## 2020-08-29 NOTE — Assessment & Plan Note (Addendum)
PMH PTE Eliquis prophylaxis Clinically rhythm reg & rate controlled today

## 2020-08-29 NOTE — Progress Notes (Signed)
NURSING HOME LOCATION:  [facility] ROOM NUMBER:  [id number]  CODE STATUS:  DNR  PCP:  Lilyan Punt MD  This is a comprehensive admission note to this SNFperformed on this date less than 30 days from date of admission. Included are preadmission medical/surgical history; reconciled medication list; family history; social history and comprehensive review of systems.  Corrections and additions to the records were documented. Comprehensive physical exam was also performed. Additionally a clinical summary was entered for each active diagnosis pertinent to this admission in the Problem List to enhance continuity of care.  HPI: She was admitted to this facility from  a Hospice home.  The transfer was pursued because of relative clinical stability at that institution. Past medical history includes essential hypertension, chronic pain syndrome, adult failure to thrive, chronic A. fib, and history of PTE with acute cor pulmonale, and history of congestive heart failure. She does have chronic peripheral edema. Surgical history includes back surgery, ankle fracture surgery, partial hysterectomy, and kyphoplasty x3.  The kyphoplasty's were performed at L1, 9-10, and L 11-12. She is on a fentanyl patch for her chronic back pain.   Social history: Never smoked; no alcohol use.  Family history: Noncontributory due to advanced age.   Review of systems:  Date given as "July?,  21".  She was irritated that I asked her the date. Responses were brusque.  Most of her responses were "I don't know, they do not tell me what is going on".  This is in direct contrast to her demeanor last week according to staff.  She was described as "very sweet and communicative". She tells me that she is "breathing bad" but could not tell me whether the symptoms are worse than when in CHF in May of this year.  Again response was "I don't know ".  She went on to complain that she is "stuck here & they don't tell me  anything".  She does validate "a lot of anxiety".  The remainder of the review of systems is essentially negative.  She stated that she occasionally has PND symptoms.  She also states that her toes are "kinda numb."  Constitutional: No fever, significant weight change, fatigue  Eyes: No redness, discharge, pain, vision change ENT/mouth: No nasal congestion, purulent discharge, earache, change in hearing, sore throat  Cardiovascular: No chest pain, palpitations Respiratory: No cough, sputum production, hemoptysis Gastrointestinal: No heartburn, dysphagia, abdominal pain, nausea /vomiting, rectal bleeding, melena, change in bowels Genitourinary: No dysuria, hematuria, pyuria, incontinence, nocturia Musculoskeletal: No joint stiffness, joint swelling, weakness, pain Dermatologic: No rash, pruritus, change in appearance of skin Neurologic: No dizziness, headache, syncope, seizures Endocrine: No change in hair/skin/nails, excessive thirst, excessive hunger, excessive urination  Hematologic/lymphatic: No significant bruising, lymphadenopathy, abnormal bleeding Allergy/immunology: No itchy/watery eyes, significant sneezing, urticaria, angioedema  Physical exam:  Pertinent or positive findings: She appears her age.  She is hard of hearing.  As noted her responses were very brusque when queried. On nasal O2.Grade 2 systolic murmur is loudest at the left base.  First and second heart sounds are accentuated.  She has fine rales at the bases.  Abdomen is protuberant.  Pedal pulses cannot be palpated due to edema.  She has 1+ edema at the sock line and pitting over the feet.  She has isolated bruises of the right distal wrist.  General appearance:  no acute distress, increased work of breathing is present.   Lymphatic: No lymphadenopathy about the head, neck, axilla. Eyes: No  conjunctival inflammation or lid edema is present. There is no scleral icterus. Ears:  External ear exam shows no significant lesions  or deformities.   Nose:  External nasal examination shows no deformity or inflammation. Nasal mucosa are pink and moist without lesions, exudates Oral exam: Lips and gums are healthy appearing.There is no oropharyngeal erythema or exudate. Neck:  No thyromegaly, masses, tenderness noted.    Heart:  Normal rate and regular rhythm clinically.  Lungs:  without wheezes, rhonchi,  rubs. Abdomen: Bowel sounds are normal.  Abdomen is soft and nontender with no organomegaly, hernias, masses. GU: Deferred  Extremities:  No cyanosis, clubbing. Neurologic exam: Balance, Rhomberg, finger to nose testing could not be completed due to clinical state Skin: Warm & dry w/o tenting. No significant  rash.  See clinical summary under each active problem in the Problem List with associated updated therapeutic plan

## 2020-08-30 ENCOUNTER — Non-Acute Institutional Stay (SKILLED_NURSING_FACILITY): Payer: Medicare Other | Admitting: Adult Health

## 2020-08-30 ENCOUNTER — Encounter: Payer: Self-pay | Admitting: Adult Health

## 2020-08-30 DIAGNOSIS — R627 Adult failure to thrive: Secondary | ICD-10-CM

## 2020-08-30 DIAGNOSIS — I482 Chronic atrial fibrillation, unspecified: Secondary | ICD-10-CM | POA: Diagnosis not present

## 2020-08-30 DIAGNOSIS — I130 Hypertensive heart and chronic kidney disease with heart failure and stage 1 through stage 4 chronic kidney disease, or unspecified chronic kidney disease: Secondary | ICD-10-CM | POA: Diagnosis not present

## 2020-08-30 DIAGNOSIS — J9611 Chronic respiratory failure with hypoxia: Secondary | ICD-10-CM

## 2020-08-30 NOTE — Progress Notes (Signed)
Location:  [facility] Room Number: 134 Place of Service:  SNF (31)   CODE STATUS: dnr  Allergies  Allergen Reactions   Amlodipine Swelling    Leg swelling    Codeine Nausea And Vomiting and Other (See Comments)    Patient states "intolerance to all pain medications"  (NO OPIOIDS) VERY VIOLENT VOMITING!!   Other Nausea And Vomiting and Other (See Comments)    general anesthesia   Tramadol Nausea And Vomiting and Other (See Comments)    "NO OPIOIDS!!! VERY VIOLENT VOMITING!!" per Med History prior to 02/18/17    Chief Complaint  Patient presents with   Acute Visit    Care plan meeting.     HPI:  We have come together for her care plan meeting. Family present. BIMS 13/15 mood 4/30: decreased energy; restless at times. There have been no falls. She requires extensive assist with adls. Is occasionally incontinent of bladder and bowel. Her pain is managed with her current regimen has not needed prn medications. Is on chronic 02. Her ativan was reduced to every hs. Dietary: weight is 113.4 pounds; has variable appetite; she is feeding herself. Has supplements ordered. Therapy wheelchair management and walker needs; has a walker that is better sized; and will get a reacher for her. 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 Chronic respiratory failure with hypoxia  Past Medical History:  Diagnosis Date   Anxiety    Arthritis    Atrial fibrillation (HCC)    CHF (congestive heart failure) (HCC)    Chronic back pain    Complication of anesthesia    Constipation    Depression    Diastolic heart failure (HCC)    Dyspnea    Dysrhythmia    AFib   Essential hypertension    History of blood transfusion    History of cervical fracture    Insomnia    Migraine headache    Osteoporosis    Pneumonia    PONV (postoperative nausea and  vomiting)    "patch helped"   Trigeminal neuralgia    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[facility])     Past Surgical History:  Procedure Laterality Date   ANKLE FRACTURE SURGERY Right    BACK SURGERY  2022   CARDIAC CATHETERIZATION     denies   CATARACT EXTRACTION W/PHACO Right 07/22/2012   Procedure: CATARACT EXTRACTION PHACO AND INTRAOCULAR LENS PLACEMENT ([place]);  Surgeon: Tonny Branch, MD;  Location: [facility];  Service: Ophthalmology;  Laterality: Right;  CDE:  18.93   CATARACT EXTRACTION W/PHACO Left 08/09/2012   Procedure: CATARACT EXTRACTION PHACO AND INTRAOCULAR LENS PLACEMENT ([facility]);  Surgeon: Tonny Branch, MD;  Location: [facility];  Service: Ophthalmology;  Laterality: Left;  CDE: 17.21   COLONOSCOPY     FLEXIBLE SIGMOIDOSCOPY N/A 11/19/2018   Procedure: FLEXIBLE SIGMOIDOSCOPY;  Surgeon: Danie Binder, MD;  Location: [facility];  Service: Endoscopy;  Laterality: N/A;  7:30am   HARDWARE REMOVAL  07/18/2011   Procedure: HARDWARE REMOVAL; Right leg-  Surgeon: Carole Civil, MD;  Location: [facility];  Service: Orthopedics;  Laterality: Right;   HEMORRHOID BANDING N/A 11/19/2018   Procedure: Thayer Jew;  Surgeon: Danie Binder, MD;  Location: [facility];  Service: Endoscopy;  Laterality: N/A;   KYPHOPLASTY N/A 12/26/2016   Procedure: Lumbar two Lumbar three and Lumbar five KYPHOPLASTY;  Surgeon:  Erline Levine, MD;  Location: [facility];  Service: Neurosurgery;  Laterality: N/A;   KYPHOPLASTY N/A 02/19/2017   Procedure: KYPHOPLASTY LUMBAR ONE;  Surgeon: Erline Levine, MD;  Location: [facility];  Service: Neurosurgery;  Laterality: N/A;  KYPHOPLASTY LUMBAR ONE   KYPHOPLASTY N/A 04/20/2020   Procedure: Thoracic eleven, Thoracic twelve Kyphoplasty;  Surgeon: Erline Levine, MD;  Location: [facility];  Service: Neurosurgery;  Laterality: N/A;   KYPHOPLASTY N/A 05/15/2020   Procedure: THORACIC NINE, THORACIC TEN  KYPHOPLASTY;  Surgeon: Erline Levine, MD;  Location: [facility];  Service: Neurosurgery;  Laterality: N/A;    NERVE REPAIR  07/18/2011   Procedure: NERVE REPAIR;  Surgeon: Carole Civil, MD;  Location: [facility];  Service: Orthopedics;  Laterality: Right;  Right leg superficial peroneal nerve release     PARTIAL HYSTERECTOMY      Social History   Socioeconomic History   Marital status: Widowed    Spouse name: Not on file   Number of children: 3   Years of education: Not on file   Highest education level: Not on file  Occupational History   Occupation: retired    Fish farm manager: RETIRED  Tobacco Use   Smoking status: Never   Smokeless tobacco: Never  Vaping Use   Vaping Use: Never used  Substance and Sexual Activity   Alcohol use: No    Alcohol/week: 0.0 standard drinks   Drug use: No   Sexual activity: Not Currently    Birth control/protection: Surgical    Comment: hyst  Other Topics Concern   Not on file  Social History Narrative   Not on file   Social Determinants of Health   Financial Resource Strain: Not on file  Food Insecurity: Not on file  Transportation Needs: Not on file  Physical Activity: Not on file  Stress: Not on file  Social Connections: Not on file  Intimate Partner Violence: Not on file   Family History  Problem Relation Age of Onset   Hypertension Mother    Alzheimer's disease Mother    Cancer Mother    Hypertension Father    Other Father        abused pain meds   Diabetes Brother    Other Brother        back problems   Diabetes Daughter    Anesthesia problems Neg Hx    Hypotension Neg Hx    Malignant hyperthermia Neg Hx    Pseudochol deficiency Neg Hx       VITAL SIGNS BP (!) 156/78   Pulse 70   Temp (!) 97.4 F (36.3 C)   Resp 20   Ht 5' 2.5" (1.588 m)   Wt 113 lb 6.4 oz (51.4 kg)   BMI 20.41 kg/m   Outpatient Encounter Medications as of 08/30/2020  Medication Sig   apixaban (ELIQUIS) 5 MG TABS tablet Take 5 mg by mouth 2 (two) times daily.   bisacodyl (DULCOLAX) 10 MG suppository Place 10 mg rectally as needed for moderate  constipation, severe constipation or mild constipation. Once A Day Every 3 Days   Dextran 70-Hypromellose (ARTIFICIAL TEARS) 0.1-0.3 % SOLN Apply 1 drop to eye in the morning, at noon, in the evening, and at bedtime.   diphenhydrAMINE (BENADRYL) 25 MG tablet Take 25 mg by mouth every 6 (six) hours as needed for itching.   fentaNYL (DURAGESIC) 50 MCG/HR Place 1 patch onto the skin every 3 (three) days. Special Instructions: Rotate sites and remove old patch prior to placing new patch.  Once A Day Every 3 Days   furosemide (LASIX) 40 MG tablet Take 40 mg by mouth 3 (three) times daily. 9 am, 6 pm, and 9 pm   hydrocortisone cream 1 % Apply 1 application topically 4 (four) times daily. Apply to rectal area qid prn hemorrhoids.   HYDROmorphone (DILAUDID) 8 MG tablet Take 8 mg by mouth as directed. Every 3 hours as needed for moderate to severe pain   lactulose (CHRONULAC) 10 GM/15ML solution Take 10 g by mouth 2 (two) times daily as needed for mild constipation, moderate constipation or severe constipation. 30 mls   LORazepam (ATIVAN) 0.5 MG tablet Take 1 tablet (0.5 mg total) by mouth 3 (three) times daily. As needed for inability to relax, nervousness, agitation x 14 days and then provider to reassess need. Attempt and document a non-drug intervention and outcome prior to administering medication.   losartan (COZAAR) 25 MG tablet Take 25 mg by mouth daily.   metoprolol succinate (TOPROL-XL) 100 MG 24 hr tablet TAKE ONE TABLET BY MOUTH ONCE DAILY.   NON FORMULARY Diet:Regular   Nutritional Supplements (ENSURE ENLIVE PO) Take 1 Can by mouth daily at 12 noon. Prefers vanilla flavor please   ondansetron (ZOFRAN-ODT) 4 MG disintegrating tablet Take 4 mg by mouth every 6 (six) hours as needed for nausea or vomiting.   OXYGEN Inhale 2 L into the lungs continuous. To keep O2 above 90 %   polyethylene glycol (MIRALAX / GLYCOLAX) 17 g packet Take 17 g by mouth daily as needed.   potassium chloride SA (KLOR-CON) 20  MEQ tablet Take 20 mEq by mouth daily. Give with food and full glass of liquid Do not crush   simethicone (MYLICON) 263 MG chewable tablet Chew 125 mg by mouth every 4 (four) hours as needed for flatulence.   No facility-administered encounter medications on file as of 08/30/2020.     SIGNIFICANT DIAGNOSTIC EXAMS   PREVIOUS   07-13-20: wbc 6.1; hgb 11.0; hct 37.3; mcv 89.7 plt 333 glucose 86; bun 26; creat 1.33; k+ 4.6; na++ 135; ca 8.4 GFR 39; alk phos 153; albumin 3.0  08-21-20: wbc 5.4; hgb 13.1; hct 13.1; mcv 87.3 plt 283; glucose 89; bun 34; creat 1.46; k+ 4.0; na++ 139; ca 8.7; GFR 35 total bili 1.4; albumin 3.4   NO NEW LABS.  Review of Systems  Constitutional:  Negative for malaise/fatigue.  Respiratory:  Negative for cough and shortness of breath.   Cardiovascular:  Negative for chest pain, palpitations and leg swelling.  Gastrointestinal:  Negative for abdominal pain, constipation and heartburn.  Musculoskeletal:  Negative for back pain, joint pain and myalgias.  Skin: Negative.   Neurological:  Negative for dizziness.  Psychiatric/Behavioral:  The patient is not nervous/anxious.    Physical Exam Constitutional:      General: She is not in acute distress.    Appearance: She is well-developed. She is not diaphoretic.  Neck:     Thyroid: No thyromegaly.  Cardiovascular:     Rate and Rhythm: Normal rate and regular rhythm.     Pulses: Normal pulses.     Heart sounds: Normal heart sounds.  Pulmonary:     Effort: Pulmonary effort is normal. No respiratory distress.     Breath sounds: Normal breath sounds.  Abdominal:     General: Bowel sounds are normal. There is no distension.     Palpations: Abdomen is soft.     Tenderness: There is no abdominal tenderness.  Musculoskeletal:  Cervical back: Neck supple.     Right lower leg: Edema present.     Left lower leg: Edema present.     Comments: Is able to move all extremities Has 2+ bilateral lower extremity Kyphosis:  history of multiple kyphoplasties    Lymphadenopathy:     Cervical: No cervical adenopathy.  Skin:    General: Skin is warm and dry.  Neurological:     Mental Status: She is alert. Mental status is at baseline.  Psychiatric:        Mood and Affect: Mood normal.     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    Time spent with patient: 40 minutes: medications; care needs; goals of care; dietary needs.    Ok Edwards NP [facility]  Contact [phone] Monday through Friday 8am- 5pm  After hours call [phone]

## 2020-09-04 ENCOUNTER — Non-Acute Institutional Stay (SKILLED_NURSING_FACILITY): Payer: Medicare Other | Admitting: Adult Health

## 2020-09-04 ENCOUNTER — Encounter: Payer: Self-pay | Admitting: Adult Health

## 2020-09-04 DIAGNOSIS — R230 Cyanosis: Secondary | ICD-10-CM | POA: Diagnosis not present

## 2020-09-04 DIAGNOSIS — I5033 Acute on chronic diastolic (congestive) heart failure: Secondary | ICD-10-CM

## 2020-09-04 NOTE — Progress Notes (Signed)
Location:  [facility] Room Number: 134 Place of Service:  SNF (31)   CODE STATUS: dnr   Allergies  Allergen Reactions   Amlodipine Swelling    Leg swelling    Codeine Nausea And Vomiting and Other (See Comments)    Patient states "intolerance to all pain medications"  (NO OPIOIDS) VERY VIOLENT VOMITING!!   Other Nausea And Vomiting and Other (See Comments)    general anesthesia   Tramadol Nausea And Vomiting and Other (See Comments)    "NO OPIOIDS!!! VERY VIOLENT VOMITING!!" per Med History prior to 02/18/17    Chief Complaint  Patient presents with   Acute Visit    Family concerns     HPI:  Her family is concerned about her level of in edema present. Yesterday was a bad day for her. She had increased difficulty eating and drinking; had hard stools followed by liquid stools. There are no reports of fevers present. She denies any cough or shortness of breath. She denies any chest pain but does have achy right upper rib cage pain. She is presently taking lasix 40 mg twice daily; she continues to be followed by hospice care.   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[facility])     Past Surgical History:  Procedure Laterality Date   ANKLE FRACTURE SURGERY Right    BACK SURGERY  2022   CARDIAC CATHETERIZATION     denies   CATARACT EXTRACTION W/PHACO Right 07/22/2012   Procedure: CATARACT EXTRACTION PHACO  AND INTRAOCULAR LENS PLACEMENT ([place]);  Surgeon: Tonny Branch, MD;  Location: [facility];  Service: Ophthalmology;  Laterality: Right;  CDE:  18.93   CATARACT EXTRACTION W/PHACO Left 08/09/2012   Procedure: CATARACT EXTRACTION PHACO AND INTRAOCULAR LENS PLACEMENT ([facility]);  Surgeon: Tonny Branch, MD;  Location: [facility];  Service: Ophthalmology;  Laterality: Left;  CDE: 17.21   COLONOSCOPY     FLEXIBLE SIGMOIDOSCOPY N/A 11/19/2018   Procedure: FLEXIBLE SIGMOIDOSCOPY;  Surgeon: Danie Binder, MD;  Location: [facility];  Service: Endoscopy;  Laterality: N/A;  7:30am   HARDWARE REMOVAL  07/18/2011   Procedure: HARDWARE REMOVAL; Right leg-  Surgeon: Carole Civil, MD;  Location: [facility];  Service: Orthopedics;  Laterality: Right;   HEMORRHOID BANDING N/A 11/19/2018   Procedure: [place]ayer Jew;  Surgeon: Danie Binder, MD;  Location: [facility];  Service: Endoscopy;  Laterality: N/A;   KYPHOPLASTY N/A 12/26/2016   Procedure: Lumbar two Lumbar three and Lumbar five KYPHOPLASTY;  Surgeon: Erline Levine, MD;  Location: [facility];  Service: Neurosurgery;  Laterality: N/A;   KYPHOPLASTY N/A 02/19/2017   Procedure: KYPHOPLASTY LUMBAR ONE;  Surgeon: Erline Levine, MD;  Location: [facility];  Service: Neurosurgery;  Laterality: N/A;  KYPHOPLASTY LUMBAR ONE  KYPHOPLASTY N/A 04/20/2020   Procedure: Thoracic eleven, Thoracic twelve Kyphoplasty;  Surgeon: Erline Levine, MD;  Location: [facility];  Service: Neurosurgery;  Laterality: N/A;   KYPHOPLASTY N/A 05/15/2020   Procedure: THORACIC NINE, THORACIC TEN  KYPHOPLASTY;  Surgeon: Erline Levine, MD;  Location: [facility];  Service: Neurosurgery;  Laterality: N/A;   NERVE REPAIR  07/18/2011   Procedure: NERVE REPAIR;  Surgeon: Carole Civil, MD;  Location: [facility];  Service: Orthopedics;  Laterality: Right;  Right leg superficial peroneal nerve release     PARTIAL HYSTERECTOMY      Social History   Socioeconomic History   Marital status: Widowed    Spouse name: Not on file    Number of children: 3   Years of education: Not on file   Highest education level: Not on file  Occupational History   Occupation: retired    Fish farm manager: RETIRED  Tobacco Use   Smoking status: Never   Smokeless tobacco: Never  Vaping Use   Vaping Use: Never used  Substance and Sexual Activity   Alcohol use: No    Alcohol/week: 0.0 standard drinks   Drug use: No   Sexual activity: Not Currently    Birth control/protection: Surgical    Comment: hyst  Other Topics Concern   Not on file  Social History Narrative   Not on file   Social Determinants of Health   Financial Resource Strain: Not on file  Food Insecurity: Not on file  Transportation Needs: Not on file  Physical Activity: Not on file  Stress: Not on file  Social Connections: Not on file  Intimate Partner Violence: Not on file   Family History  Problem Relation Age of Onset   Hypertension Mother    Alzheimer's disease Mother    Cancer Mother    Hypertension Father    Other Father        abused pain meds   Diabetes Brother    Other Brother        back problems   Diabetes Daughter    Anesthesia problems Neg Hx    Hypotension Neg Hx    Malignant hyperthermia Neg Hx    Pseudochol deficiency Neg Hx       VITAL SIGNS BP 132/70   Pulse 99   Temp (!) 96.9 F (36.1 C)   Resp 20   Ht 5' 2.5" (1.588 m)   Wt 113 lb 6.4 oz (51.4 kg)   SpO2 94%   BMI 20.41 kg/m   Outpatient Encounter Medications as of 09/04/2020  Medication Sig   furosemide (LASIX) 40 MG tablet Take 40 mg by mouth 2 (two) times daily.   apixaban (ELIQUIS) 5 MG TABS tablet Take 5 mg by mouth 2 (two) times daily.   bisacodyl (DULCOLAX) 10 MG suppository Place 10 mg rectally as needed for moderate constipation, severe constipation or mild constipation. Once A Day Every 3 Days   Dextran 70-Hypromellose (ARTIFICIAL TEARS) 0.1-0.3 % SOLN Apply 1 drop to eye in the morning, at noon, in the evening, and at bedtime.   diphenhydrAMINE (BENADRYL) 25 MG  tablet Take 25 mg by mouth every 6 (six) hours as needed for itching.   fentaNYL (DURAGESIC) 50 MCG/HR Place 1 patch onto the skin every 3 (three) days. Special Instructions: Rotate sites and remove old patch prior to placing new patch. Once A Day Every 3 Days   hydrocortisone cream 1 % Apply 1 application topically 4 (four) times daily. Apply to rectal area qid prn  hemorrhoids.   HYDROmorphone (DILAUDID) 8 MG tablet Take 8 mg by mouth as directed. Every 3 hours as needed for moderate to severe pain   lactulose (CHRONULAC) 10 GM/15ML solution Take 10 g by mouth 2 (two) times daily as needed for mild constipation, moderate constipation or severe constipation. 30 mls   LORazepam (ATIVAN) 0.5 MG tablet Take 1 tablet (0.5 mg total) by mouth 3 (three) times daily. As needed for inability to relax, nervousness, agitation x 14 days and then provider to reassess need. Attempt and document a non-drug intervention and outcome prior to administering medication.   losartan (COZAAR) 25 MG tablet Take 25 mg by mouth daily.   metoprolol succinate (TOPROL-XL) 100 MG 24 hr tablet TAKE ONE TABLET BY MOUTH ONCE DAILY.   NON FORMULARY Diet:Regular   Nutritional Supplements (ENSURE ENLIVE PO) Take 1 Can by mouth daily at 12 noon. Prefers vanilla flavor please   ondansetron (ZOFRAN-ODT) 4 MG disintegrating tablet Take 4 mg by mouth every 6 (six) hours as needed for nausea or vomiting.   OXYGEN Inhale 2 L into the lungs continuous. To keep O2 above 90 %   polyethylene glycol (MIRALAX / GLYCOLAX) 17 g packet Take 17 g by mouth daily as needed.   potassium chloride SA (KLOR-CON) 20 MEQ tablet Take 20 mEq by mouth daily. Give with food and full glass of liquid Do not crush   simethicone (MYLICON) 400 MG chewable tablet Chew 125 mg by mouth every 4 (four) hours as needed for flatulence.   No facility-administered encounter medications on file as of 09/04/2020.     SIGNIFICANT DIAGNOSTIC EXAMS   PREVIOUS   07-13-20: wbc  6.1; hgb 11.0; hct 37.3; mcv 89.7 plt 333 glucose 86; bun 26; creat 1.33; k+ 4.6; na++ 135; ca 8.4 GFR 39; alk phos 153; albumin 3.0  08-21-20: wbc 5.4; hgb 13.1; hct 13.1; mcv 87.3 plt 283; glucose 89; bun 34; creat 1.46; k+ 4.0; na++ 139; ca 8.7; GFR 35 total bili 1.4; albumin 3.4   NO NEW LABS.  Review of Systems  Constitutional:  Negative for malaise/fatigue.  Respiratory:  Negative for cough and shortness of breath.   Cardiovascular:  Positive for leg swelling. Negative for chest pain and palpitations.  Gastrointestinal:  Negative for abdominal pain, constipation and heartburn.  Musculoskeletal:  Positive for back pain and joint pain. Negative for myalgias.       Back pain is chronic and controlled Right upper rib cage pain.   Skin: Negative.   Neurological:  Negative for dizziness.  Psychiatric/Behavioral:  The patient is not nervous/anxious.    Physical Exam Constitutional:      General: She is not in acute distress.    Appearance: She is well-developed. She is not diaphoretic.  Neck:     Thyroid: No thyromegaly.  Cardiovascular:     Rate and Rhythm: Normal rate. Rhythm irregular.     Pulses: Normal pulses.     Heart sounds: Normal heart sounds.  Pulmonary:     Effort: Pulmonary effort is normal. No respiratory distress.     Breath sounds: Normal breath sounds.  Abdominal:     General: Bowel sounds are normal. There is no distension.     Palpations: Abdomen is soft.     Tenderness: There is no abdominal tenderness.  Musculoskeletal:     Cervical back: Neck supple.     Right lower leg: Edema present.     Left lower leg: Edema present.     Comments: Is  able to move all extremities Has 2-3+ bilateral lower extremity Kyphosis: history of multiple kyphoplasties    Lymphadenopathy:     Cervical: No cervical adenopathy.  Skin:    General: Skin is warm and dry.     Comments: Skin is fragile Cyanosis bilateral feet   Neurological:     Mental Status: She is alert. Mental  status is at baseline.  Psychiatric:        Mood and Affect: Mood normal.      ASSESSMENT/ PLAN:  TODAY  Acute on chronic diastolic congestive heart failure Cyanosis of toes  Is worse:   Will increase to lasix 60 mg twice daily  Will repeat BMP on 09-13-20.    Ok Edwards NP [facility]  Contact [phone] Monday through Friday 8am- 5pm  After hours call [phone]

## 2020-09-05 ENCOUNTER — Encounter: Payer: Self-pay | Admitting: Adult Health

## 2020-09-05 ENCOUNTER — Non-Acute Institutional Stay (SKILLED_NURSING_FACILITY): Payer: Medicare Other | Admitting: Adult Health

## 2020-09-05 DIAGNOSIS — Z0000 Encounter for general adult medical examination without abnormal findings: Secondary | ICD-10-CM | POA: Diagnosis not present

## 2020-09-05 DIAGNOSIS — D692 Other nonthrombocytopenic purpura: Secondary | ICD-10-CM | POA: Diagnosis not present

## 2020-09-05 NOTE — Progress Notes (Signed)
Subjective:   Katherine Lane is a 87 y.o. female who presents for Medicare Annual (Subsequent) preventive examination.  Review of Systems    Review of Systems  Constitutional:  Negative for malaise/fatigue.  Respiratory:  Negative for cough and shortness of breath.   Cardiovascular:  Positive for leg swelling. Negative for chest pain and palpitations.  Gastrointestinal:  Negative for abdominal pain, constipation and heartburn.  Musculoskeletal:  Negative for back pain, joint pain and myalgias.  Skin: Negative.   Neurological:  Negative for dizziness.  Psychiatric/Behavioral:  The patient is not nervous/anxious.    Cardiac Risk Factors include: advanced age (>36men, >57 women);sedentary lifestyle     Objective:    Today's Vitals   09/05/20 0936  BP: 124/66  Pulse: 99  Resp: 16  Temp: (!) 96.5 F (35.8 C)  SpO2: 96%  Weight: 113 lb 6.4 oz (51.4 kg)  Height: 5' 2.5" (1.588 m)   Body mass index is 20.41 kg/m.  Advanced Directives 09/05/2020 08/27/2020 08/22/2020 08/17/2020 07/16/2020 07/12/2020 07/12/2020  Does Patient Have a Medical Advance Directive? Yes Yes Yes Yes Yes Yes Yes  Type of Advance Directive Living will;Healthcare Power of Attorney;Out of facility DNR (pink MOST or yellow form) Out of facility DNR (pink MOST or yellow form);Healthcare Power of [employer] [place];Living will;Out of facility DNR (pink MOST or yellow form) Living will;Out of facility DNR (pink MOST or yellow form) Healthcare Power of [employer] Power of [employer] Power of Attorney  Does patient want to make changes to medical advance directive? No - Patient declined No - Patient declined No - Patient declined No - Patient declined No - Patient declined - -  Copy of Healthcare Power of Attorney in Chart? - Yes - validated most recent copy scanned in chart (See row information) Yes - validated most recent copy scanned in chart (See row information) - - Yes - validated most  recent copy scanned in chart (See row information) Yes - validated most recent copy scanned in chart (See row information)  Would patient like information on creating a medical advance directive? - - - - - - No - Patient declined  Pre-existing out of facility DNR order (yellow form or pink MOST form) - - - - - - -    Current Medications (verified) Outpatient Encounter Medications as of 09/05/2020  Medication Sig  . apixaban (ELIQUIS) 5 MG TABS tablet Take 5 mg by mouth 2 (two) times daily.  . bisacodyl (DULCOLAX) 10 MG suppository Place 10 mg rectally as needed for moderate constipation, severe constipation or mild constipation. Once A Day Every 3 Days  . Dextran 70-Hypromellose (ARTIFICIAL TEARS) 0.1-0.3 % SOLN Apply 1 drop to eye in the morning, at noon, in the evening, and at bedtime.  . diphenhydrAMINE (BENADRYL) 25 MG tablet Take 25 mg by mouth every 6 (six) hours as needed for itching.  . fentaNYL (DURAGESIC) 50 MCG/HR Place 1 patch onto the skin every 3 (three) days. Special Instructions: Rotate sites and remove old patch prior to placing new patch. Once A Day Every 3 Days  . furosemide (LASIX) 20 MG tablet Take 20 mg by mouth 2 (two) times daily. For chronic diastolic (congestive) heart failure  . hydrocortisone cream 1 % Apply 1 application topically 4 (four) times daily. Apply to rectal area qid prn hemorrhoids.  Marland Kitchen HYDROmorphone (DILAUDID) 8 MG tablet Take 8 mg by mouth as directed. Every 3 hours as needed for moderate to severe pain  .  lactulose (CHRONULAC) 10 GM/15ML solution Take 10 g by mouth 2 (two) times daily as needed for mild constipation, moderate constipation or severe constipation. 30 mls  . LORazepam (ATIVAN) 0.5 MG tablet Take 0.5 mg by mouth at bedtime.  Marland Kitchen losartan (COZAAR) 25 MG tablet Take 25 mg by mouth daily.  . metolazone (ZAROXOLYN) 2.5 MG tablet Take 2.5 mg by mouth daily. DX: Hypertensive heart and chronic kidney disease with heart failure and stage 1 through stage  4 chronic kidney disease, or unspecified chronic kidney disease  . metoprolol succinate (TOPROL-XL) 100 MG 24 hr tablet TAKE ONE TABLET BY MOUTH ONCE DAILY.  . NON FORMULARY Diet:Regular  . Nutritional Supplements (ENSURE ENLIVE PO) Take 1 Can by mouth daily at 12 noon. Prefers vanilla flavor please  . ondansetron (ZOFRAN-ODT) 4 MG disintegrating tablet Take 4 mg by mouth every 6 (six) hours as needed for nausea or vomiting.  . OXYGEN Inhale 2 L into the lungs continuous. To keep O2 above 90 %  . polyethylene glycol (MIRALAX / GLYCOLAX) 17 g packet Take 17 g by mouth daily as needed.  . potassium chloride SA (KLOR-CON) 20 MEQ tablet Take 20 mEq by mouth daily. Give with food and full glass of liquid Do not crush  . sertraline (ZOLOFT) 25 MG tablet Take 25 mg by mouth daily. Major depressive disorder, single episode, unspecified  . simethicone (MYLICON) 125 MG chewable tablet Chew 125 mg by mouth every 4 (four) hours as needed for flatulence.  . [DISCONTINUED] furosemide (LASIX) 40 MG tablet Take 60 mg by mouth 2 (two) times daily. (Patient not taking: Reported on 09/05/2020)  . [DISCONTINUED] LORazepam (ATIVAN) 0.5 MG tablet Take 1 tablet (0.5 mg total) by mouth 3 (three) times daily. As needed for inability to relax, nervousness, agitation x 14 days and then provider to reassess need. Attempt and document a non-drug intervention and outcome prior to administering medication.   No facility-administered encounter medications on file as of 09/05/2020.    Allergies (verified) Amlodipine, Codeine, Other, and Tramadol   History: Past Medical History:  Diagnosis Date  . Anxiety   . Arthritis   . Atrial fibrillation (HCC)   . CHF (congestive heart failure) (HCC)   . Chronic back pain   . Complication of anesthesia   . Constipation   . Depression   . Diastolic heart failure (HCC)   . Dilated cardiomyopathy (HCC) 07/12/2020  . Dyspnea   . Dysrhythmia    AFib  . Essential hypertension   .  History of blood transfusion   . History of cervical fracture   . Insomnia   . Migraine headache   . Osteoporosis   . Pneumonia   . PONV (postoperative nausea and vomiting)    "patch helped"  . Trigeminal neuralgia   . 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 ANKLE FRACTURE SURGERY Right   . BACK SURGERY  2022  . CARDIAC CATHETERIZATION     denies  . CATARACT EXTRACTION W/PHACO Right 07/22/2012   Procedure: CATARACT EXTRACTION PHACO AND INTRAOCULAR LENS PLACEMENT (IOC);  Surgeon: Gemma Payor, MD;  Location: [facility];  Service: Ophthalmology;  Laterality: Right;  CDE:  18.93  . CATARACT EXTRACTION W/PHACO Left 08/09/2012   Procedure: CATARACT EXTRACTION PHACO AND INTRAOCULAR LENS PLACEMENT (IOC);  Surgeon: Gemma Payor, MD;  Location: [facility];  Service: Ophthalmology;  Laterality: Left;  CDE: 17.21  . COLONOSCOPY    . FLEXIBLE SIGMOIDOSCOPY N/A 11/19/2018  Procedure: FLEXIBLE SIGMOIDOSCOPY;  Surgeon: West BaliFields, Sandi L, MD;  Location: [facility];  Service: Endoscopy;  Laterality: N/A;  7:30am  . HARDWARE REMOVAL  07/18/2011   Procedure: HARDWARE REMOVAL; Right leg-  Surgeon: Vickki HearingStanley E Harrison, MD;  Location: [facility];  Service: Orthopedics;  Laterality: Right;  . HEMORRHOID BANDING N/A 11/19/2018   Procedure: HEMORRHOID BANDING;  Surgeon: West BaliFields, Sandi L, MD;  Location: [facility];  Service: Endoscopy;  Laterality: N/A;  . KYPHOPLASTY N/A 12/26/2016   Procedure: Lumbar two Lumbar three and Lumbar five KYPHOPLASTY;  Surgeon: Maeola HarmanStern, Joseph, MD;  [facility];  Service: Neurosurgery;  Laterality: N/A;  . KYPHOPLASTY N/A 02/19/2017   Procedure: KYPHOPLASTY LUMBAR ONE;  Surgeon: Maeola HarmanStern, Joseph, MD;  [facility];  Service: Neurosurgery;  Laterality: N/A;  KYPHOPLASTY LUMBAR ONE  . KYPHOPLASTY N/A 04/20/2020   Procedure: Thoracic eleven, Thoracic twelve Kyphoplasty;  Surgeon: Maeola HarmanStern, Joseph, MD;  [facility];  Service: Neurosurgery;  Laterality:  N/A;  . KYPHOPLASTY N/A 05/15/2020   Procedure: THORACIC NINE, THORACIC TEN  KYPHOPLASTY;  Surgeon: Maeola HarmanStern, Joseph, MD;  [facility];  Service: Neurosurgery;  Laterality: N/A;  . NERVE REPAIR  07/18/2011   Procedure: NERVE REPAIR;  Surgeon: Vickki HearingStanley E Harrison, MD;  Location: [facility];  Service: Orthopedics;  Laterality: Right;  Right leg superficial peroneal nerve release    . PARTIAL HYSTERECTOMY     Family History  Problem Relation Age of Onset  . Hypertension Mother   . Alzheimer's disease Mother   . Cancer Mother   . Hypertension Father   . Other Father        abused pain meds  . Diabetes Brother   . Other Brother        back problems  . Diabetes Daughter   . Anesthesia problems Neg Hx   . Hypotension Neg Hx   . Malignant hyperthermia Neg Hx   . Pseudochol deficiency Neg Hx    Social History   Socioeconomic History  . Marital status: Widowed    Spouse name: Not on file  . Number of children: 3  . Years of education: Not on file  . Highest education level: Not on file  Occupational History  . Occupation: retired    Associate Professormployer: RETIRED  Tobacco Use  . Smoking status: Never  . Smokeless tobacco: Never  Vaping Use  . Vaping Use: Never used  Substance and Sexual Activity  . Alcohol use: No    Alcohol/week: 0.0 standard drinks  . Drug use: No  . Sexual activity: Not Currently    Birth control/protection: Surgical    Comment: hyst  Other Topics Concern  . Not on file  Social History Narrative   [facility]    Social Determinants of Health   Financial Resource Strain: Not on file  Food Insecurity: Not on file  Transportation Needs: Not on file  Physical Activity: Not on file  Stress: Not on file  Social Connections: Not on file    Tobacco Counseling Counseling given: Not Answered   Clinical Intake:  Pre-visit preparation completed: Yes  Pain : No/denies pain     BMI - recorded: 20.41 Nutritional Status: BMI of 19-24   Normal Nutritional Risks: Unintentional weight loss Diabetes: No  How often do you need to have someone help you when you read instructions, pamphlets, or other written materials from your doctor or pharmacy?: 3 - Sometimes  Diabetic?no  Interpreter Needed?: No  Activities of Daily Living In your present state of health, do you have any difficulty performing the following activities: 09/05/2020 07/11/2020  Hearing? Malvin Johns  Vision? N N  Difficulty concentrating or making decisions? Y N  Walking or climbing stairs? Y Y  Dressing or bathing? Y Y  Doing errands, shopping? Malvin Johns  Preparing Food and eating ? Y -  Using the Toilet? Y -  In the past six months, have you accidently leaked urine? Y -  Do you have problems with loss of bowel control? Y -  Managing your Medications? Y -  Managing your Finances? Y -  Housekeeping or managing your Housekeeping? Y -  Some recent data might be hidden    Patient Care Team: Sharee Holster, NP as PCP - General (Geriatric Medicine) Lanelle Bal, DO as Consulting Physician (Internal Medicine) [facility]  Indicate any recent Medical Services you may have received from other than [facility] providers in the past year (date may be approximate).     Assessment:   This is a routine wellness examination for Brittainy.  Hearing/Vision screen No results found.  Dietary issues and exercise activities discussed: Current Exercise Habits: The patient does not participate in regular exercise at present, Exercise limited by: cardiac condition(s)   Goals Addressed             This Visit's Progress   . Absence of Fall and Fall-Related Injury       Evidence-based guidance:  Assess fall risk using a validated tool when available. Consider balance and gait impairment, muscle weakness, diminished vision or hearing, environmental hazards, presence of urinary or bowel urgency and/or incontinence.  Communicate fall injury  risk to interprofessional healthcare team.  Develop a fall prevention plan with the patient and family.  Promote use of personal vision and auditory aids.  Promote reorientation, appropriate sensory stimulation, and routines to decrease risk of fall when changes in mental status are present.  Assess assistance level required for safe and effective self-care; consider referral for home care.  Encourage physical activity, such as performance of self-care at highest level of ability, strength and balance exercise program, and provision of appropriate assistive devices; refer to rehabilitation therapy.  Refer to community-based fall prevention program where available.  If fall occurs, determine the cause and revise fall injury prevention plan.  Regularly review medication contribution to fall risk; consider risk related to polypharmacy and age.  Refer to pharmacist for consultation when concerns about medications are revealed.  Balance adequate pain management with potential for oversedation.  Provide guidance related to environmental modifications.  Consider supplementation with Vitamin D.   Notes:     . Follow up with Primary Care Provider      . General - Client will not be readmitted within 30 days ([facility])         Depression Screen PHQ 2/9 Scores 09/05/2020 06/06/2020 09/06/2019 01/01/2016 01/01/2016  PHQ - 2 Score 0 0 0 2 2  PHQ- 9 Score - - 1 3 -    Fall Risk Fall Risk  09/05/2020 06/06/2020 06/10/2019  Falls in the past year? 0 0 0  Number falls in past yr: 0 0 0  Injury with Fall? 0 0 0  Risk for fall due to : Impaired balance/gait;Impaired mobility - -  Follow up - Falls evaluation completed -    FALL RISK PREVENTION PERTAINING TO THE HOME:  Any stairs in or around the home? No  If so,  are there any without handrails?  na Home free of loose throw rugs in walkways, pet beds, electrical cords, etc? Yes  Adequate lighting in your home to reduce risk of falls? Yes   ASSISTIVE  DEVICES UTILIZED TO PREVENT FALLS:  Life alert? No  Use of a cane, walker or w/c? Yes  Grab bars in the bathroom? Yes  Shower chair or bench in shower? Yes  Elevated toilet seat or a handicapped toilet? Yes   TIMED UP AND GO:  Was the test performed? No .  Length of time to ambulate 10 feet:  sec.   Is able to ambulate short distance with assistance and walker.   Cognitive Function: MMSE - Mini Mental State Exam 09/05/2020  Orientation to time 5  Orientation to Place 4  Registration 3  Attention/ Calculation 3  Recall 2  Language- name 2 objects 2  Language- repeat 1  Language- follow 3 step command 3  Language- read & follow direction 1  Write a sentence 0  Copy design 0  Total score 24     6CIT Screen 09/05/2020  What Year? 0 points  What month? 0 points  What time? 0 points  Count back from 20 2 points  Months in reverse 4 points  Repeat phrase 4 points  Total Score 10    Immunizations Immunization History  Administered Date(s) Administered  . Influenza,inj,Quad PF,6+ Mos 11/01/2013, 11/24/2014, 01/07/2017, 12/02/2017, 11/08/2018, 11/24/2019  . Influenza-Unspecified 03/02/2012, 12/18/2012, 03/10/2016  . Moderna Sars-Covid-2 Vaccination 03/10/2019, 04/15/2019, 07/08/2020  . Pneumococcal Polysaccharide-23 01/02/2020  . Zoster, Live 03/08/2013    TDAP status: Up to date  Flu Vaccine status: Up to date  Pneumococcal vaccine status: Up to date  Covid-19 vaccine status: Completed vaccines  Qualifies for Shingles Vaccine? No   Zostavax completed No   Shingrix Completed?: No.    Education has been provided regarding the importance of this vaccine. Patient has been advised to call insurance company to determine out of pocket expense if they have not yet received this vaccine. Advised may also receive vaccine at local pharmacy or Health Dept. Verbalized acceptance and understanding.  Screening Tests Health Maintenance  Topic Date Due  . TETANUS/TDAP  Never  done  . Zoster Vaccines- Shingrix (1 of 2) Never done  . INFLUENZA VACCINE  09/17/2020  . COVID-19 Vaccine (4 - Booster for Moderna series) 10/08/2020  . DEXA SCAN  Completed  . HPV VACCINES  Aged Out  . PNA vac Low Risk Adult  Discontinued    Health Maintenance  Health Maintenance Due  Topic Date Due  . TETANUS/TDAP  Never done  . Zoster Vaccines- Shingrix (1 of 2) Never done    Colorectal cancer screening: No longer required.   Mammogram status: No longer required due to age.  No dexa scan; patient being followed by hospice care.   Lung Cancer Screening: (Low Dose CT Chest recommended if Age 20-80 years, 30 pack-year currently smoking OR have quit w/in 15years.) does not qualify.   Lung Cancer Screening Referral: na  Additional Screening:  Hepatitis C Screening: does not qualify; Completed   Vision Screening: Recommended annual ophthalmology exams for early detection of glaucoma and other disorders of the eye. Is the patient up to date with their annual eye exam?  Yes  Who is the provider or what is the name of the office in which the patient attends annual eye exams? At facility  If pt is not established with a provider, would they like to  be referred to a provider to establish care? No .   Dental Screening: Recommended annual dental exams for proper oral hygiene  Community Resource Referral / Chronic Care Management: CRR required this visit?  No   CCM required this visit?  No      Plan:     I have personally reviewed and noted the following in the patient's chart:   Medical and social history Use of alcohol, tobacco or illicit drugs  Current medications and supplements including opioid prescriptions.  Functional ability and status Nutritional status Physical activity Advanced directives List of other physicians Hospitalizations, surgeries, and ER visits in previous 12 months Vitals Screenings to include cognitive, depression, and falls Referrals and  appointments  In addition, I have reviewed and discussed with patient certain preventive protocols, quality metrics, and best practice recommendations. A written personalized care plan for preventive services as well as general preventive health recommendations were provided to patient.     Sharee Holster, NP   09/05/2020   Nurse Notes:

## 2020-09-06 ENCOUNTER — Other Ambulatory Visit (HOSPITAL_COMMUNITY)
Admission: RE | Admit: 2020-09-06 | Discharge: 2020-09-06 | Disposition: A | Discharge status: Home / Self Care | Source: Skilled Nursing Facility | Attending: Adult Health | Admitting: Adult Health

## 2020-09-06 DIAGNOSIS — I130 Hypertensive heart and chronic kidney disease with heart failure and stage 1 through stage 4 chronic kidney disease, or unspecified chronic kidney disease: Secondary | ICD-10-CM | POA: Insufficient documentation

## 2020-09-06 LAB — BASIC METABOLIC PANEL
Anion gap: 11 (ref 5–15)
BUN: 39 mg/dL — ABNORMAL HIGH (ref 8–23)
CO2: 34 mmol/L — ABNORMAL HIGH (ref 22–32)
Calcium: 8.6 mg/dL — ABNORMAL LOW (ref 8.9–10.3)
Chloride: 85 mmol/L — ABNORMAL LOW (ref 98–111)
Creatinine, Ser: 1.63 mg/dL — ABNORMAL HIGH (ref 0.44–1.00)
GFR, Estimated: 31 mL/min — ABNORMAL LOW (ref >60)
Glucose, Bld: 83 mg/dL (ref 70–99)
Potassium: 4.8 mmol/L (ref 3.5–5.1)
Sodium: 130 mmol/L — ABNORMAL LOW (ref 135–145)

## 2020-09-17 DEATH — deceased
